# Patient Record
Sex: Male | Born: 1935
Health system: Southern US, Community
[De-identification: ages and names within clinical notes are randomized; demographics above are authoritative.]

## PROBLEM LIST (undated history)

## (undated) DIAGNOSIS — R001 Bradycardia, unspecified: Secondary | ICD-10-CM

## (undated) DIAGNOSIS — D649 Anemia, unspecified: Secondary | ICD-10-CM

## (undated) DIAGNOSIS — T7840XA Allergy, unspecified, initial encounter: Secondary | ICD-10-CM

## (undated) DIAGNOSIS — H3530 Unspecified macular degeneration: Secondary | ICD-10-CM

## (undated) DIAGNOSIS — M1990 Unspecified osteoarthritis, unspecified site: Secondary | ICD-10-CM

## (undated) DIAGNOSIS — I4891 Unspecified atrial fibrillation: Secondary | ICD-10-CM

## (undated) DIAGNOSIS — I219 Acute myocardial infarction, unspecified: Secondary | ICD-10-CM

## (undated) DIAGNOSIS — I209 Angina pectoris, unspecified: Secondary | ICD-10-CM

## (undated) DIAGNOSIS — I739 Peripheral vascular disease, unspecified: Secondary | ICD-10-CM

## (undated) DIAGNOSIS — I2510 Atherosclerotic heart disease of native coronary artery without angina pectoris: Secondary | ICD-10-CM

## (undated) DIAGNOSIS — Z87442 Personal history of urinary calculi: Secondary | ICD-10-CM

## (undated) DIAGNOSIS — H409 Unspecified glaucoma: Secondary | ICD-10-CM

## (undated) DIAGNOSIS — E785 Hyperlipidemia, unspecified: Secondary | ICD-10-CM

## (undated) DIAGNOSIS — I10 Essential (primary) hypertension: Secondary | ICD-10-CM

## (undated) DIAGNOSIS — I38 Endocarditis, valve unspecified: Secondary | ICD-10-CM

## (undated) DIAGNOSIS — K219 Gastro-esophageal reflux disease without esophagitis: Secondary | ICD-10-CM

## (undated) DIAGNOSIS — N200 Calculus of kidney: Secondary | ICD-10-CM

## (undated) DIAGNOSIS — R739 Hyperglycemia, unspecified: Secondary | ICD-10-CM

## (undated) HISTORY — DX: Hyperglycemia, unspecified: R73.9

## (undated) HISTORY — PX: CATARACT EXTRACTION W/ INTRAOCULAR LENS  IMPLANT, BILATERAL: SHX1307

## (undated) HISTORY — PX: EYE SURGERY: SHX253

## (undated) HISTORY — DX: Bradycardia, unspecified: R00.1

## (undated) HISTORY — PX: MOUTH SURGERY: SHX715

## (undated) HISTORY — DX: Anemia, unspecified: D64.9

## (undated) HISTORY — PX: TONSILLECTOMY: SUR1361

## (undated) HISTORY — DX: Allergy, unspecified, initial encounter: T78.40XA

## (undated) HISTORY — DX: Calculus of kidney: N20.0

## (undated) HISTORY — PX: CARPAL TUNNEL RELEASE: SHX101

## (undated) HISTORY — PX: ESOPHAGOGASTRODUODENOSCOPY: SHX1529

## (undated) HISTORY — DX: Unspecified macular degeneration: H35.30

## (undated) HISTORY — PX: HEMORROIDECTOMY: SUR656

---

## 2005-06-02 HISTORY — PX: CORONARY ARTERY BYPASS GRAFT: SHX141

## 2005-06-02 HISTORY — PX: CARDIAC CATHETERIZATION: SHX172

## 2005-08-07 ENCOUNTER — Ambulatory Visit: Payer: Self-pay | Admitting: Unknown Physician Specialty

## 2006-02-03 ENCOUNTER — Ambulatory Visit: Payer: Self-pay | Admitting: Internal Medicine

## 2006-02-16 ENCOUNTER — Inpatient Hospital Stay (HOSPITAL_COMMUNITY): Admission: RE | Admit: 2006-02-16 | Discharge: 2006-02-21 | Payer: Self-pay | Admitting: Cardiothoracic Surgery

## 2006-03-13 ENCOUNTER — Encounter: Admission: RE | Admit: 2006-03-13 | Discharge: 2006-03-13 | Payer: Self-pay | Admitting: Cardiothoracic Surgery

## 2006-03-18 ENCOUNTER — Encounter: Payer: Self-pay | Admitting: Internal Medicine

## 2006-04-02 ENCOUNTER — Encounter: Payer: Self-pay | Admitting: Internal Medicine

## 2006-05-02 ENCOUNTER — Encounter: Payer: Self-pay | Admitting: Internal Medicine

## 2006-06-02 ENCOUNTER — Encounter: Payer: Self-pay | Admitting: Internal Medicine

## 2009-04-25 ENCOUNTER — Ambulatory Visit: Payer: Self-pay | Admitting: Unknown Physician Specialty

## 2009-05-03 ENCOUNTER — Ambulatory Visit: Payer: Self-pay | Admitting: Unknown Physician Specialty

## 2009-06-02 DIAGNOSIS — I219 Acute myocardial infarction, unspecified: Secondary | ICD-10-CM

## 2009-06-02 HISTORY — PX: SHOULDER SURGERY: SHX246

## 2009-06-02 HISTORY — PX: LUMBAR EPIDURAL INJECTION: SHX1980

## 2009-06-02 HISTORY — DX: Acute myocardial infarction, unspecified: I21.9

## 2009-12-15 ENCOUNTER — Ambulatory Visit: Payer: Self-pay | Admitting: Family Medicine

## 2009-12-31 ENCOUNTER — Ambulatory Visit: Payer: Self-pay | Admitting: Pain Medicine

## 2010-01-03 ENCOUNTER — Ambulatory Visit: Payer: Self-pay | Admitting: Pain Medicine

## 2010-01-03 ENCOUNTER — Other Ambulatory Visit: Payer: Self-pay | Admitting: Pain Medicine

## 2010-01-17 ENCOUNTER — Ambulatory Visit: Payer: Self-pay | Admitting: Pain Medicine

## 2010-04-02 ENCOUNTER — Ambulatory Visit: Payer: Self-pay | Admitting: Family Medicine

## 2010-08-06 ENCOUNTER — Ambulatory Visit: Payer: Self-pay | Admitting: Internal Medicine

## 2010-08-23 ENCOUNTER — Emergency Department: Payer: Self-pay | Admitting: Emergency Medicine

## 2012-03-15 LAB — HM PAP SMEAR: HM Pap smear: 1.6

## 2012-03-16 ENCOUNTER — Ambulatory Visit: Payer: Self-pay | Admitting: Family Medicine

## 2013-01-20 LAB — CBC AND DIFFERENTIAL
HEMATOCRIT: 41 % (ref 41–53)
Hemoglobin: 14.2 g/dL (ref 13.5–17.5)
NEUTROS ABS: 4 /uL
PLATELETS: 183 10*3/uL (ref 150–399)
WBC: 6 10^3/mL

## 2013-01-21 ENCOUNTER — Ambulatory Visit: Payer: Self-pay | Admitting: Family Medicine

## 2013-03-30 ENCOUNTER — Ambulatory Visit: Payer: Self-pay | Admitting: Orthopedic Surgery

## 2013-05-09 ENCOUNTER — Ambulatory Visit: Payer: Self-pay | Admitting: Orthopedic Surgery

## 2013-05-09 LAB — CBC WITH DIFFERENTIAL/PLATELET
Basophil %: 0.8 %
Eosinophil #: 0.1 10*3/uL (ref 0.0–0.7)
HGB: 14.6 g/dL (ref 13.0–18.0)
Lymphocyte %: 19.6 %
MCH: 31.5 pg (ref 26.0–34.0)
Monocyte #: 0.6 x10 3/mm (ref 0.2–1.0)
Neutrophil #: 4.2 10*3/uL (ref 1.4–6.5)

## 2013-05-09 LAB — BASIC METABOLIC PANEL
BUN: 26 mg/dL — ABNORMAL HIGH (ref 7–18)
Chloride: 103 mmol/L (ref 98–107)
Co2: 29 mmol/L (ref 21–32)
EGFR (African American): 50 — ABNORMAL LOW
Osmolality: 278 (ref 275–301)
Potassium: 4.4 mmol/L (ref 3.5–5.1)
Sodium: 137 mmol/L (ref 136–145)

## 2013-05-16 ENCOUNTER — Ambulatory Visit: Payer: Self-pay | Admitting: Orthopedic Surgery

## 2013-05-18 LAB — PATHOLOGY REPORT

## 2013-08-05 ENCOUNTER — Ambulatory Visit (INDEPENDENT_AMBULATORY_CARE_PROVIDER_SITE_OTHER): Payer: Medicare Other | Admitting: Podiatry

## 2013-08-05 ENCOUNTER — Encounter: Payer: Self-pay | Admitting: Podiatry

## 2013-08-05 ENCOUNTER — Ambulatory Visit (INDEPENDENT_AMBULATORY_CARE_PROVIDER_SITE_OTHER): Payer: Medicare Other

## 2013-08-05 VITALS — BP 169/88 | HR 65 | Resp 16 | Ht 67.0 in | Wt 170.0 lb

## 2013-08-05 DIAGNOSIS — M779 Enthesopathy, unspecified: Secondary | ICD-10-CM

## 2013-08-05 DIAGNOSIS — M79609 Pain in unspecified limb: Secondary | ICD-10-CM

## 2013-08-05 DIAGNOSIS — M2010 Hallux valgus (acquired), unspecified foot: Secondary | ICD-10-CM

## 2013-08-05 DIAGNOSIS — M2040 Other hammer toe(s) (acquired), unspecified foot: Secondary | ICD-10-CM

## 2013-08-05 DIAGNOSIS — M79673 Pain in unspecified foot: Secondary | ICD-10-CM

## 2013-08-05 MED ORDER — TRIAMCINOLONE ACETONIDE 10 MG/ML IJ SUSP
10.0000 mg | Freq: Once | INTRAMUSCULAR | Status: AC
  Administered 2013-08-05: 10 mg

## 2013-08-05 NOTE — Progress Notes (Signed)
Subjective:     Patient ID: Austin [name], male   DOB: 04/19/1929, 80 y.o.   MRN: [id number]  Foot Pain   patient presents stating I had a long term bunion and digital deformity of my right foot and I am now getting pain more in the outside of my foot for the last one year. States that I did like to play golf and that are hard to do because of the pain and I did have my shoulder fixed in December so I have been off my foot more   Review of Systems  All other systems reviewed and are negative.       Objective:   Physical Exam  Nursing note and vitals reviewed. Constitutional: He is oriented to person, place, and time.  Cardiovascular: Intact distal pulses.   Musculoskeletal: Normal range of motion.  Neurological: He is oriented to person, place, and time.  Skin: Skin is warm.   neurovascular status found to be intact with muscle strength adequate and no equinus condition noted. He does have some depression of the arch noted on both feet and has significant for foot structural malalignment with structural bunion deformity and rigid contracture second toe right foot. Patient has exquisite discomfort underneath the third metatarsophalangeal joint on the right foot    Assessment:     Structural HAV hammertoe deformity capsulitis of the third MPJ right foot    Plan:     H&P and x-ray reviewed right foot and today I am focusing on the abnormal joint I did a proximal nerve block aspirated the joint and was able to get out a small amount of clear fluid and then injected with a half cc of dexamethasone Kenalog combination and applied a thick pad to reduce stress against the joint. Reappoint one week and discuss long-term orthotics therapy

## 2013-08-05 NOTE — Progress Notes (Signed)
   Subjective:    Patient ID: Austin [name], male    DOB: January 16, 1929, 80 y.o.   MRN: [id number]  HPI Comments: N 0 L right foot 2nd digit and under last 3 toes D 1 yr O slowly C worse A walking T massage, topical ointment  Foot Pain      Review of Systems  All other systems reviewed and are negative.       Objective:   Physical Exam        Assessment & Plan:

## 2013-08-12 ENCOUNTER — Ambulatory Visit (INDEPENDENT_AMBULATORY_CARE_PROVIDER_SITE_OTHER): Payer: Medicare Other | Admitting: Podiatry

## 2013-08-12 VITALS — BP 121/52 | HR 66 | Resp 16 | Ht 67.0 in | Wt 170.0 lb

## 2013-08-12 DIAGNOSIS — M779 Enthesopathy, unspecified: Secondary | ICD-10-CM

## 2013-08-12 NOTE — Progress Notes (Signed)
Subjective:     Patient ID: Austin [name], male   DOB: [birth year], 80 y.o.   MRN: [id number]  HPI patient states my right foot is improved but still tender around the joint if I am on it a lot   Review of Systems     Objective:   Physical Exam Neurovascular status intact with mild to moderate discomfort third metatarsophalangeal joint right with fluid accumulation still noted within the joint surface    Assessment:     Capsulitis third MPJ of the right foot    Plan:     Reviewed condition and recommended custom orthotics which were explained the patient. Patient is scanned for customized orthotics at this time and will be seen back in 2 weeks for dispensing

## 2013-08-25 ENCOUNTER — Encounter: Payer: Self-pay | Admitting: *Deleted

## 2013-08-25 NOTE — Progress Notes (Signed)
Sent pt postcard letting him know that orthotics are here.

## 2013-08-26 ENCOUNTER — Ambulatory Visit (INDEPENDENT_AMBULATORY_CARE_PROVIDER_SITE_OTHER): Payer: Medicare Other | Admitting: *Deleted

## 2013-08-26 DIAGNOSIS — M779 Enthesopathy, unspecified: Secondary | ICD-10-CM

## 2013-08-26 NOTE — Patient Instructions (Signed)

## 2013-09-16 ENCOUNTER — Encounter: Payer: Self-pay | Admitting: Podiatry

## 2013-09-16 ENCOUNTER — Ambulatory Visit (INDEPENDENT_AMBULATORY_CARE_PROVIDER_SITE_OTHER): Payer: Medicare Other | Admitting: Podiatry

## 2013-09-16 VITALS — BP 147/64 | HR 67 | Resp 16

## 2013-09-16 DIAGNOSIS — M779 Enthesopathy, unspecified: Secondary | ICD-10-CM

## 2013-09-18 NOTE — Progress Notes (Signed)
Subjective:     Patient ID: Austin [name], male   DOB: April 01, 1929, 80 y.o.   MRN: [id number]  HPI patient states he cannot wear his orthotics and certain shoes but that the pain that he has had is quite a bit better   Review of Systems     Objective:   Physical Exam Neurovascular status intact with diminished inflammation and pain in the left third MPJ with minimal discomfort upon pressure    Assessment:     Improved capsulitis left third MPJ with combination of shoe gear modification and orthotic    Plan:     Advised on continued anti-inflammatories rigid bottom shoes physical therapy and orthotic usage we discussed injections in the future if necessary and will reappoint as needed

## 2013-09-23 ENCOUNTER — Ambulatory Visit: Payer: Medicare Other | Admitting: Podiatry

## 2013-12-14 LAB — HEPATIC FUNCTION PANEL
ALK PHOS: 65 U/L (ref 25–125)
ALT: 15 U/L (ref 10–40)
AST: 20 U/L (ref 14–40)
BILIRUBIN, TOTAL: 1.2 mg/dL

## 2013-12-14 LAB — HEMOGLOBIN A1C: HEMOGLOBIN A1C: 5.5 % (ref 4.0–6.0)

## 2013-12-14 LAB — BASIC METABOLIC PANEL
BUN: 28 mg/dL — AB (ref 4–21)
CREATININE: 1.5 mg/dL — AB (ref 0.6–1.3)
Glucose: 108 mg/dL
Potassium: 4.4 mmol/L (ref 3.4–5.3)
Sodium: 139 mmol/L (ref 137–147)

## 2013-12-14 LAB — LIPID PANEL
Cholesterol: 172 mg/dL (ref 0–200)
HDL: 49 mg/dL (ref 35–70)
LDL CALC: 102 mg/dL
LDl/HDL Ratio: 2.1
TRIGLYCERIDES: 104 mg/dL (ref 40–160)

## 2014-04-05 ENCOUNTER — Ambulatory Visit: Payer: Self-pay | Admitting: Ophthalmology

## 2014-04-26 ENCOUNTER — Ambulatory Visit: Payer: Self-pay | Admitting: Ophthalmology

## 2014-07-06 DIAGNOSIS — Z0000 Encounter for general adult medical examination without abnormal findings: Secondary | ICD-10-CM | POA: Diagnosis not present

## 2014-07-06 DIAGNOSIS — Z1389 Encounter for screening for other disorder: Secondary | ICD-10-CM | POA: Diagnosis not present

## 2014-07-13 DIAGNOSIS — H4312 Vitreous hemorrhage, left eye: Secondary | ICD-10-CM | POA: Diagnosis not present

## 2014-07-19 DIAGNOSIS — Z961 Presence of intraocular lens: Secondary | ICD-10-CM | POA: Diagnosis not present

## 2014-07-19 DIAGNOSIS — H4310 Vitreous hemorrhage, unspecified eye: Secondary | ICD-10-CM | POA: Diagnosis not present

## 2014-07-25 DIAGNOSIS — H4312 Vitreous hemorrhage, left eye: Secondary | ICD-10-CM | POA: Diagnosis not present

## 2014-07-26 DIAGNOSIS — H4312 Vitreous hemorrhage, left eye: Secondary | ICD-10-CM | POA: Diagnosis not present

## 2014-07-26 DIAGNOSIS — H547 Unspecified visual loss: Secondary | ICD-10-CM | POA: Diagnosis not present

## 2014-08-03 DIAGNOSIS — H4312 Vitreous hemorrhage, left eye: Secondary | ICD-10-CM | POA: Diagnosis not present

## 2014-08-29 DIAGNOSIS — Z86010 Personal history of colonic polyps: Secondary | ICD-10-CM | POA: Diagnosis not present

## 2014-08-29 DIAGNOSIS — Z1211 Encounter for screening for malignant neoplasm of colon: Secondary | ICD-10-CM | POA: Diagnosis not present

## 2014-09-11 DIAGNOSIS — Z961 Presence of intraocular lens: Secondary | ICD-10-CM | POA: Diagnosis not present

## 2014-09-11 DIAGNOSIS — H4011X0 Primary open-angle glaucoma, stage unspecified: Secondary | ICD-10-CM | POA: Diagnosis not present

## 2014-09-20 DIAGNOSIS — I10 Essential (primary) hypertension: Secondary | ICD-10-CM | POA: Insufficient documentation

## 2014-09-22 NOTE — Op Note (Signed)
PATIENT NAME:  Austin [name], Austin [name] MR#:  [id number] DATE OF BIRTH:  [birth year]  DATE OF PROCEDURE:  05/16/2013  PREOPERATIVE DIAGNOSIS:  Right shoulder rotator cuff tear with chronic impingement and acromioclavicular joint arthrosis.   POSTOPERATIVE DIAGNOSIS:  Right shoulder rotator cuff tear with chronic impingement and acromioclavicular joint arthrosis.    PROCEDURE:  Right shoulder arthroscopic debridement and synovectomy, removal of loose body, subacromial decompression and distal clavicle excision with mini-open rotator cuff repair.   ANESTHESIA:  General with right interscalene block.   SURGEON:  Timoteo Gaul, M.D.   ESTIMATED BLOOD LOSS:  Minimal.   COMPLICATIONS:  None.   IMPLANTS:  ArthroCare Magnum 2 anchors x 4.   INDICATIONS FOR THE SURGERY:  Austin [name] is an 81 year old male who has had right shoulder pain since February or March 2014. His pain has become progressively worse over time and has become more constant. The patient is very active, both performing jobs at home as well as working out in [employer]. He has noted persistent pain with abduction activity, which affects his ability to workout, play golf and perform activities at home. He has failed all nonoperative management including nonsteroidal anti-inflammatories and corticosteroid injections as well as strengthening exercises. The patient has elected to undergo surgical repair of his right shoulder rotator cuff. The patient had an MRI showing a large rotator cuff tear with retraction to the glenoid. He had severe degenerative changes of the glenohumeral joint is well. The patient had tendinosis of the subscapularis seen as well.   I recommended that we evaluate his shoulder arthroscopically, and attempt a rotator cuff repair. Given the retraction and age of the tear, he understands it may not possible to repair the tendon. He also has been noted on his MRI to have fatty atrophy, which is another indication of a  long-standing injury. The patient understood that the risks of surgery included infection, bleeding, nerve or blood vessel injury, especially to the axillary nerve leading to arm weakness or numbness, shoulder stiffness, re-tear of the rotator cuff or failure of the hardware, failure to repair the rotator cuff and the need for further surgery including total shoulder arthroplasty. Medical complications include, but are not limited to, DVT and pulmonary embolism, myocardial infarction, stroke, pneumonia, respiratory failure and death. The patient understood these risks and wished to proceed.   PROCEDURE NOTE:  The patient was met in preoperative area. I updated his history and physical. His wife is at the bedside. I signed his right shoulder according to the hospital's right site protocol and I answered all the questions by the patient and the family. The patient was then brought to the preoperative area where he then underwent a right interscalene block. He then was brought to the Operating Room where he underwent general anesthesia. He was positioned in the beach chair position for the case. All bony prominences were adequately padded. An examination under anesthesia revealed full shoulder range of motion without instability.   The patient was then prepped and draped in sterile fashion. A time-out was performed to verify the patient's name, date of birth, medical record number, correct site of surgery, and correct procedure to be performed. It was also used to verify the patient had received antibiotics and that all appropriate instruments, implants and radiographic studies were available in the room. Once all in attendance were in agreement, the case began.   The patient's bony landmarks were drawn out with a surgical marker along with the proposed arthroscopy incisions. The  incisions were drawn out with a surgical marker and pre-injected with 1% lidocaine plain. An 11 blade was used to establish a posterior  portal. A hemostat was used to spread the posterior muscle fibers before the arthroscope was placed into the glenohumeral joint. A full diagnostic examination of the shoulder was performed.   Findings on arthroscopy included significant synovitis throughout the glenohumeral joint. There was advanced degenerative changes of the humeral head and to a lesser extent the glenoid. There were 2 loose bodies seen superior to the glenoid and I questioned whether these are actually small bone fragments from a possible, previous the glenoid fracture involving the superior-most aspect of the glenoid. The biceps tendon was not visualized and presumed to be torn and likely retracted out of the joint. The subscapularis was frayed and had an intrasubstance tears, but was not completely torn from the lesser tuberosity. There was a very large tear of the rotator cuff involving the supra and infraspinatus. In the inferior recess, there were no loose bodies but there was significant synovitis. No Hagl lesion was seen. There was no tear of the anterior labrum.   An anterior portal was established using an 18-gauge spinal needle for localization. A 5.75-mm arthroscopic cannula was inserted through the anterior portal. A hook probe was used for the diagnostic portion of the arthroscopy and a 4.0 resector shaver blade was placed through the anterior portal to perform a debridement and synovectomy. A lateral portal was also created using the 18-gauge spinal needle. This allowed for passage of a Kingfisher grasper to remove the 2 loose bodies superior to the glenoid. An Therapist, nutritional was also placed into the posterior leading edge of the rotator cuff and brought out the lateral portal for later localization.   The arthroscope was then removed from the glenohumeral joint and placed in the subacromial space. There was abundant bursitis. A 90-degree ArthroCare wand was placed through the lateral portal along with a 4.0 resector  shaver blade. An extensive bursectomy and debridement was undertaken, which allowed better visualization of the rotator cuff and appreciation of the cuff morphology. There was cuff tissue posteriorly but none superior to the glenoid. It was unclear if the rotator cuff had retracted posteriorly and if there was a large longitudinal split within the tissue, although no definite cuff was identified anteriorly. An additional Smart stitch was placed in the posterior edge of the rotator cuff. With a Kingfisher, the posterior portion of the cuff could be advanced anteriorly to cover a portion of the superior humeral head. The decision was made to try to advance the posterior portion of the cuff above the humeral head to provide some humeral head depression function as well as possibly improve the patient's abduction ability. Views of the rotator cuff were taken also through the anterior portal to better understand the morphology.   A subacromial decompression was performed using a 5.5-mm resector shaver blade. A distal clavicle excision was also performed through the anterior portal given the advanced degenerative changes in the [facility] joint. The shoulder was then copiously irrigated. All arthroscopic instruments were removed.   A saber-type incision was then made along the lateral aspect of the acromion. The subcutaneous tissues were dissected using electrocautery. The deltoid muscle fibers were then identified and split in line with their fibers. The Smart stitches, which had been previously placed arthroscopically, were brought out through the deltoid split. The rotator cuff could be seen and was mobilized off the posterior humeral head to allow for  better advancement superiorly using a #15 blade. Additional Smart stitch was placed in what appeared to be the infraspinatus. Three Opus Magnum 2 anchors were placed intubated greater tuberosity causing advancement of the humeral head. The posterior half of the humeral head  was then covered, but the superior aspect of the glenoid still remained exposed. Some anterior fibers of the rotator cuff were also identified and mobilized. This was also advanced to allow for some convergence laterally of the rotator cuff. A single Magnum 2 anchor was placed to repair of the anterior cuff advancements. The wound was then copiously irrigated. Although there still remained exposed glenohumeral joint, there advancement of the infraspinatus, which I am hopeful will provide improved function and pain relief to the patient. The wound was copiously irrigated. The deltoid fascia was closed with a 0 Vicryl. The subcutaneous tissue closed with 2-0 Vicryl and the skin approximated with a running 4-0 Monocryl. The 3 arthroscopy portals were closed with 4-0 nylon. The patient was awakened and brought to the PACU in stable condition.   I explained to his wife and his daughter about the large tear and that only time will tell whether he will regain much function or have pain relief. He was placed in an abduction sling and has a Polar Care sleeve over the right shoulder. A TENS unit was also applied to the right shoulder as well.   The decision was made not to use a Connexa graft as there was no definite attachment site medially for the graft adhere to. I did not think placement of a graft in this situation would be effective.   ____________________________ Timoteo Gaul, MD [id number] D: 05/21/2013 13:25:53 ET T: 05/21/2013 14:35:17 ET JOB#: [id number]  cc: Timoteo Gaul, MD, <Dictator> Timoteo Gaul MD ELECTRONICALLY SIGNED 05/27/2013 14:41

## 2014-09-25 DIAGNOSIS — I10 Essential (primary) hypertension: Secondary | ICD-10-CM | POA: Diagnosis not present

## 2014-09-25 DIAGNOSIS — I255 Ischemic cardiomyopathy: Secondary | ICD-10-CM | POA: Diagnosis not present

## 2014-09-25 DIAGNOSIS — I340 Nonrheumatic mitral (valve) insufficiency: Secondary | ICD-10-CM | POA: Diagnosis not present

## 2014-09-25 DIAGNOSIS — I25709 Atherosclerosis of coronary artery bypass graft(s), unspecified, with unspecified angina pectoris: Secondary | ICD-10-CM | POA: Diagnosis not present

## 2014-10-02 ENCOUNTER — Ambulatory Visit: Payer: Medicare Other | Admitting: *Deleted

## 2014-10-02 ENCOUNTER — Ambulatory Visit: Payer: Self-pay | Admitting: Unknown Physician Specialty

## 2014-10-02 ENCOUNTER — Ambulatory Visit
Admission: AD | Admit: 2014-10-02 | Payer: Medicare Other | Source: Ambulatory Visit | Admitting: Unknown Physician Specialty

## 2014-10-02 ENCOUNTER — Encounter: Admission: AD | Payer: Self-pay | Source: Ambulatory Visit

## 2014-10-02 ENCOUNTER — Ambulatory Visit
Admission: RE | Admit: 2014-10-02 | Discharge: 2014-10-02 | Disposition: A | Discharge status: Home / Self Care | Payer: Medicare Other | Source: Ambulatory Visit | Attending: Unknown Physician Specialty | Admitting: Unknown Physician Specialty

## 2014-10-02 ENCOUNTER — Encounter
Admission: RE | Disposition: A | Discharge status: Home / Self Care | Payer: Self-pay | Source: Ambulatory Visit | Attending: Unknown Physician Specialty

## 2014-10-02 ENCOUNTER — Encounter: Payer: Self-pay | Admitting: *Deleted

## 2014-10-02 DIAGNOSIS — K5730 Diverticulosis of large intestine without perforation or abscess without bleeding: Secondary | ICD-10-CM | POA: Insufficient documentation

## 2014-10-02 DIAGNOSIS — Z860100 Personal history of colon polyps, unspecified: Secondary | ICD-10-CM

## 2014-10-02 DIAGNOSIS — K635 Polyp of colon: Secondary | ICD-10-CM | POA: Diagnosis not present

## 2014-10-02 DIAGNOSIS — Z7982 Long term (current) use of aspirin: Secondary | ICD-10-CM | POA: Insufficient documentation

## 2014-10-02 DIAGNOSIS — D124 Benign neoplasm of descending colon: Secondary | ICD-10-CM | POA: Insufficient documentation

## 2014-10-02 DIAGNOSIS — K640 First degree hemorrhoids: Secondary | ICD-10-CM | POA: Insufficient documentation

## 2014-10-02 DIAGNOSIS — Z7902 Long term (current) use of antithrombotics/antiplatelets: Secondary | ICD-10-CM | POA: Insufficient documentation

## 2014-10-02 DIAGNOSIS — K5790 Diverticulosis of intestine, part unspecified, without perforation or abscess without bleeding: Secondary | ICD-10-CM | POA: Diagnosis not present

## 2014-10-02 DIAGNOSIS — Z79899 Other long term (current) drug therapy: Secondary | ICD-10-CM | POA: Diagnosis not present

## 2014-10-02 DIAGNOSIS — Z1211 Encounter for screening for malignant neoplasm of colon: Secondary | ICD-10-CM | POA: Diagnosis not present

## 2014-10-02 HISTORY — DX: Essential (primary) hypertension: I10

## 2014-10-02 HISTORY — DX: Gastro-esophageal reflux disease without esophagitis: K21.9

## 2014-10-02 HISTORY — PX: COLONOSCOPY: SHX5424

## 2014-10-02 HISTORY — DX: Acute myocardial infarction, unspecified: I21.9

## 2014-10-02 HISTORY — DX: Unspecified osteoarthritis, unspecified site: M19.90

## 2014-10-02 LAB — HM COLONOSCOPY

## 2014-10-02 SURGERY — COLONOSCOPY
Anesthesia: Monitor Anesthesia Care

## 2014-10-02 SURGERY — COLONOSCOPY
Anesthesia: General

## 2014-10-02 MED ORDER — PHENYLEPHRINE HCL 10 MG/ML IJ SOLN
INTRAMUSCULAR | Status: DC | PRN
Start: 2014-10-02 — End: 2014-10-02
  Administered 2014-10-02: 100 ug via INTRAVENOUS

## 2014-10-02 MED ORDER — LACTATED RINGERS IV SOLN
INTRAVENOUS | Status: DC
  Administered 2014-10-02: 11:00:00 via INTRAVENOUS
  Administered 2014-10-02: 1000 mL via INTRAVENOUS

## 2014-10-02 MED ORDER — SODIUM CHLORIDE 0.9 % IV SOLN: INTRAVENOUS | Status: DC

## 2014-10-02 MED ORDER — GLYCOPYRROLATE 0.2 MG/ML IJ SOLN
INTRAMUSCULAR | Status: DC | PRN
  Administered 2014-10-02: 0.2 mg via INTRAVENOUS

## 2014-10-02 MED ORDER — PROPOFOL INFUSION 10 MG/ML OPTIME
INTRAVENOUS | Status: DC | PRN
  Administered 2014-10-02: 100 ug/kg/min via INTRAVENOUS

## 2014-10-02 MED ORDER — FENTANYL CITRATE (PF) 100 MCG/2ML IJ SOLN
INTRAMUSCULAR | Status: DC | PRN
  Administered 2014-10-02: 50 ug via INTRAVENOUS

## 2014-10-02 MED ORDER — PROPOFOL 10 MG/ML IV BOLUS
INTRAVENOUS | Status: DC | PRN
  Administered 2014-10-02: 40 mg via INTRAVENOUS

## 2014-10-02 MED ORDER — EPHEDRINE SULFATE 50 MG/ML IJ SOLN
INTRAMUSCULAR | Status: DC | PRN
Start: 2014-10-02 — End: 2014-10-02
  Administered 2014-10-02: 10 mg via INTRAVENOUS

## 2014-10-02 NOTE — H&P (Signed)
Personal history of colon polyps. Walks 18 holes of golf.  On plavix which has been held.  Meds noted. See list elsewhere.  PE in office 09/02/2014 was neg.  A stable for colonoscopy.

## 2014-10-02 NOTE — Transfer of Care (Signed)
Immediate Anesthesia Transfer of Care Note  Patient: Austin [name]  Procedure(s) Performed: Procedure(s): COLONOSCOPY (N/A)  Patient Location: PACU  Anesthesia Type:General  Level of Consciousness: awake, alert  and oriented  Airway & Oxygen Therapy: Patient Spontanous Breathing and Patient connected to nasal cannula oxygen  Post-op Assessment: Report given to RN  Post vital signs: Reviewed and stable  Last Vitals:  Filed Vitals:   10/02/14 0957  BP: 142/60  Pulse: 53  Temp: 36.7 C  Resp: 20    Complications: No apparent anesthesia complications

## 2014-10-02 NOTE — Anesthesia Postprocedure Evaluation (Signed)
  Anesthesia Post-op Note  Patient: Austin [name]  Procedure(s) Performed: Procedure(s): COLONOSCOPY (N/A)  Anesthesia type:General  Patient location: PACU  Post pain: Pain level controlled  Post assessment: Post-op Vital signs reviewed, Patient's Cardiovascular Status Stable, Respiratory Function Stable, Patent Airway and No signs of Nausea or vomiting  Post vital signs: Reviewed and stable  Last Vitals:  Filed Vitals:   10/02/14 1123  BP: 118/58  Pulse: 54  Temp: 35.8 C  Resp: 18    Level of consciousness: awake, alert  and patient cooperative  Complications: No apparent anesthesia complications

## 2014-10-02 NOTE — Anesthesia Preprocedure Evaluation (Addendum)
Anesthesia Evaluation  Patient identified by MRN, date of birth, ID band Patient awake  General Assessment Comment:He is now off plavix, on ASA only, and this has been held for several days.  Airway Mallampati: III  TM Distance: >3 FB Neck ROM: Full    Dental  (+) Teeth Intact   Pulmonary former smoker,          Cardiovascular Exercise Tolerance: Good hypertension, Pt. on medications and Pt. on home beta blockers + Past MI IIRhythm:Regular Rate:Normal     Neuro/Psych    GI/Hepatic GERD-  Medicated,  Endo/Other    Renal/GU      Musculoskeletal   Abdominal   Peds  Hematology   Anesthesia Other Findings   Reproductive/Obstetrics                         Anesthesia Physical Anesthesia Plan  ASA: III  Anesthesia Plan: General   Post-op Pain Management:    Induction: Intravenous  Airway Management Planned: Nasal Cannula  Additional Equipment:   Intra-op Plan:   Post-operative Plan:   Informed Consent: I have reviewed the patients History and Physical, chart, labs and discussed the procedure including the risks, benefits and alternatives for the proposed anesthesia with the patient or authorized representative who has indicated his/her understanding and acceptance.     Plan Discussed with: CRNA  Anesthesia Plan Comments:         Anesthesia Quick Evaluation

## 2014-10-02 NOTE — Op Note (Signed)
[facility] Patient Name: Austin [name] Procedure Date: 10/02/2014 10:35 AM MRN: [id number] Account #: [id number] Date of Birth: November 20, 1928 Admit Type: Outpatient Age: 81 Room: [facility] ENDO ROOM 3 Gender: Male Note Status: Finalized Procedure:         Colonoscopy Indications:       High risk colon cancer surveillance: Personal history of                     colonic polyps Providers:         Manya Silvas, MD Referring MD:      Janine Ores. Rosanna Randy, MD (Referring MD) Medicines:         Propofol per Anesthesia Complications:     No immediate complications. Procedure:         Pre-Anesthesia Assessment:                    - After reviewing the risks and benefits, the patient was                     deemed in satisfactory condition to undergo the procedure.                    After obtaining informed consent, the colonoscope was                     passed under direct vision. Throughout the procedure, the                     patient's blood pressure, pulse, and oxygen saturations                     were monitored continuously. The Olympus PCF-H180AL                     colonoscope ( S#: [id number] ) was introduced through the                     anus and advanced to the the cecum, identified by                     appendiceal orifice and ileocecal valve. The colonoscopy                     was performed without difficulty. The patient tolerated                     the procedure well. The quality of the bowel preparation                     was excellent. Findings:      A small polyp was found in the descending colon. The polyp was       semi-pedunculated. The polyp was removed with a jumbo cold forceps.       Resection and retrieval were complete. To prevent bleeding after the       polypectomy, one hemostatic clip was successfully placed. There was no       bleeding during, and at the end, of the procedure.      A few small-mouthed diverticula were  found in the sigmoid colon and in       the transverse colon.      Internal hemorrhoids were found during endoscopy. The hemorrhoids were       small and Grade  I (internal hemorrhoids that do not prolapse). Impression:        - One small polyp in the descending colon. Resected and                     retrieved. Clip was placed.                    - Diverticulosis in the sigmoid colon and in the                     transverse colon.                    - Internal hemorrhoids. Recommendation:    - Await pathology results. Manya Silvas, MD 10/02/2014 11:12:00 AM This report has been signed electronically. Number of Addenda: 0 Note Initiated On: 10/02/2014 10:35 AM Scope Withdrawal Time: 0 hours 11 minutes 10 seconds  Total Procedure Duration: 0 hours 17 minutes 27 seconds       [facility]

## 2014-10-02 NOTE — Discharge Instructions (Signed)
Do not drive or work for 24 hours, resume usual medications and usual diet.

## 2014-10-03 DIAGNOSIS — I255 Ischemic cardiomyopathy: Secondary | ICD-10-CM | POA: Insufficient documentation

## 2014-10-03 DIAGNOSIS — I340 Nonrheumatic mitral (valve) insufficiency: Secondary | ICD-10-CM | POA: Insufficient documentation

## 2014-10-03 LAB — SURGICAL PATHOLOGY

## 2014-10-04 NOTE — Addendum Note (Signed)
Addendum  created 10/04/14 1210 by Elyse Hsu, MD   Modules edited: Anesthesia Responsible Staff

## 2014-10-05 ENCOUNTER — Encounter: Payer: Self-pay | Admitting: Unknown Physician Specialty

## 2014-11-01 DIAGNOSIS — I6529 Occlusion and stenosis of unspecified carotid artery: Secondary | ICD-10-CM | POA: Diagnosis not present

## 2014-11-03 DIAGNOSIS — I6529 Occlusion and stenosis of unspecified carotid artery: Secondary | ICD-10-CM | POA: Diagnosis not present

## 2014-11-03 DIAGNOSIS — I2510 Atherosclerotic heart disease of native coronary artery without angina pectoris: Secondary | ICD-10-CM | POA: Diagnosis not present

## 2014-11-06 ENCOUNTER — Other Ambulatory Visit: Payer: Self-pay | Admitting: Family Medicine

## 2014-11-14 DIAGNOSIS — H40003 Preglaucoma, unspecified, bilateral: Secondary | ICD-10-CM | POA: Diagnosis not present

## 2014-12-08 ENCOUNTER — Encounter: Payer: Self-pay | Admitting: Emergency Medicine

## 2014-12-08 DIAGNOSIS — H409 Unspecified glaucoma: Secondary | ICD-10-CM | POA: Insufficient documentation

## 2014-12-08 DIAGNOSIS — N200 Calculus of kidney: Secondary | ICD-10-CM

## 2014-12-08 DIAGNOSIS — I2510 Atherosclerotic heart disease of native coronary artery without angina pectoris: Secondary | ICD-10-CM | POA: Insufficient documentation

## 2014-12-08 DIAGNOSIS — D649 Anemia, unspecified: Secondary | ICD-10-CM

## 2014-12-08 DIAGNOSIS — N400 Enlarged prostate without lower urinary tract symptoms: Secondary | ICD-10-CM | POA: Insufficient documentation

## 2014-12-08 DIAGNOSIS — I38 Endocarditis, valve unspecified: Secondary | ICD-10-CM | POA: Insufficient documentation

## 2014-12-08 DIAGNOSIS — E785 Hyperlipidemia, unspecified: Secondary | ICD-10-CM | POA: Insufficient documentation

## 2014-12-08 DIAGNOSIS — H269 Unspecified cataract: Secondary | ICD-10-CM | POA: Insufficient documentation

## 2014-12-08 DIAGNOSIS — K222 Esophageal obstruction: Secondary | ICD-10-CM | POA: Insufficient documentation

## 2014-12-08 DIAGNOSIS — R7303 Prediabetes: Secondary | ICD-10-CM | POA: Insufficient documentation

## 2014-12-08 DIAGNOSIS — K219 Gastro-esophageal reflux disease without esophagitis: Secondary | ICD-10-CM | POA: Insufficient documentation

## 2014-12-08 DIAGNOSIS — G4733 Obstructive sleep apnea (adult) (pediatric): Secondary | ICD-10-CM | POA: Insufficient documentation

## 2014-12-08 DIAGNOSIS — K649 Unspecified hemorrhoids: Secondary | ICD-10-CM | POA: Insufficient documentation

## 2014-12-08 DIAGNOSIS — Q394 Esophageal web: Secondary | ICD-10-CM | POA: Insufficient documentation

## 2014-12-08 HISTORY — DX: Calculus of kidney: N20.0

## 2014-12-08 HISTORY — DX: Anemia, unspecified: D64.9

## 2015-01-08 ENCOUNTER — Encounter: Payer: Self-pay | Admitting: Family Medicine

## 2015-01-08 ENCOUNTER — Ambulatory Visit (INDEPENDENT_AMBULATORY_CARE_PROVIDER_SITE_OTHER): Payer: Medicare Other | Admitting: Family Medicine

## 2015-01-08 VITALS — BP 132/60 | HR 52 | Temp 97.9°F | Resp 16 | Wt 173.0 lb

## 2015-01-08 DIAGNOSIS — E785 Hyperlipidemia, unspecified: Secondary | ICD-10-CM | POA: Diagnosis not present

## 2015-01-08 DIAGNOSIS — K219 Gastro-esophageal reflux disease without esophagitis: Secondary | ICD-10-CM

## 2015-01-08 DIAGNOSIS — I10 Essential (primary) hypertension: Secondary | ICD-10-CM | POA: Diagnosis not present

## 2015-01-08 DIAGNOSIS — M791 Myalgia, unspecified site: Secondary | ICD-10-CM

## 2015-01-08 DIAGNOSIS — I25110 Atherosclerotic heart disease of native coronary artery with unstable angina pectoris: Secondary | ICD-10-CM

## 2015-01-08 DIAGNOSIS — R7309 Other abnormal glucose: Secondary | ICD-10-CM | POA: Diagnosis not present

## 2015-01-08 DIAGNOSIS — R7303 Prediabetes: Secondary | ICD-10-CM

## 2015-01-08 DIAGNOSIS — I2510 Atherosclerotic heart disease of native coronary artery without angina pectoris: Secondary | ICD-10-CM | POA: Insufficient documentation

## 2015-01-08 NOTE — Progress Notes (Signed)
Patient ID: Austin [name], male   DOB: 1929-03-05, 81 y.o.   MRN: [id number]    Subjective:  HPI   Hypertension, follow-up:  BP Readings from Last 3 Encounters:  01/08/15 132/60  07/06/14 142/60  10/02/14 136/58    He was last seen for hypertension 6 months ago.  BP at that visit was 142/60. Management changes since that visit include none .He reports good compliance with treatment. He is not having side effects.  He is exercising. Outside blood pressures are 130's/80;s. He is experiencing none.   Cardiovascular risk factors include none.   ------------------------------------------------------------------------    Lipid/Cholesterol, Follow-up:   Last seen for this 6 months ago.  Management changes since that visit include none.  Last Lipid Panel:    Component Value Date/Time   CHOL 172 12/14/2013   TRIG 104 12/14/2013   HDL 49 12/14/2013   LDLCALC 102 12/14/2013    He reports good compliance with treatment. He is having side effects. Muscle aches, so he takes it Lipitor every other day. He has worked this out with his Cardiologist.   Wt Readings from Last 3 Encounters:  01/08/15 173 lb (78.472 kg)  07/06/14 176 lb (79.833 kg)  10/02/14 168 lb (76.204 kg)    ------------------------------------------------------------------------ Follow up pre diabetes- Last A1C was 12/14/13-- 5.5    Prior to Admission medications   Medication Sig Start Date End Date Taking? Authorizing Provider  aspirin 81 MG tablet Take by mouth. 08/12/11  Yes Historical Provider, MD  atorvastatin (LIPITOR) 20 MG tablet Take by mouth. 04/24/14  Yes Historical Provider, MD  Biotin 5000 MCG CAPS Take by mouth.   Yes Historical Provider, MD  Cholecalciferol (VITAMIN D3) 2000 UNITS capsule Take by mouth.   Yes Historical Provider, MD  COENZYME Q-10 PO Take by mouth. 08/12/11  Yes Historical Provider, MD  Cyanocobalamin 100 MCG LOZG Take by mouth.   Yes Historical Provider, MD    GLUCOSAMINE-CHONDROIT-VIT C-MN PO Take by mouth. 08/12/11  Yes Historical Provider, MD  isosorbide mononitrate (IMDUR) 30 MG 24 hr tablet Take by mouth. 08/12/11  Yes Historical Provider, MD  metoprolol succinate (TOPROL-XL) 25 MG 24 hr tablet Take 25 mg by mouth daily.     Historical Provider, MD  Multiple Vitamins-Minerals (PRESERVISION AREDS) CAPS Take by mouth. 08/12/11  Yes Historical Provider, MD  nitroGLYCERIN (NITROSTAT) 0.4 MG SL tablet Place under the tongue. 02/25/11  Yes Historical Provider, MD  OMEGA-3 FATTY ACIDS PO Take by mouth. 02/25/11  Yes Historical Provider, MD  Omeprazole 20 MG TBEC Take by mouth. 02/25/11  Yes Historical Provider, MD  tamsulosin (FLOMAX) 0.4 MG CAPS capsule TAKE 1 CAPSULE BY MOUTH EVERY DAY 11/06/14  Yes Jerrol Banana., MD  timolol (BETIMOL) 0.5 % ophthalmic solution Place 1 drop into both eyes 2 (two) times daily.   Yes Historical Provider, MD  Travoprost, BAK Free, (TRAVATAN Z) 0.004 % SOLN ophthalmic solution Apply to eye. 08/12/11  Yes Historical Provider, MD  valsartan-hydrochlorothiazide (DIOVAN HCT) 320-25 MG per tablet Take by mouth.   Yes Historical Provider, MD    Patient Active Problem List   Diagnosis Date Noted  . Absolute anemia 12/08/2014  . Arteriosclerosis of coronary artery 12/08/2014  . Benign fibroma of prostate 12/08/2014  . Atherosclerosis of coronary artery 12/08/2014  . Carotid artery plaque 12/08/2014  . Cataract 12/08/2014  . Esophageal stenosis 12/08/2014  . Acid reflux 12/08/2014  . Glaucoma 12/08/2014  . Hemorrhoid 12/08/2014  . HLD (hyperlipidemia) 12/08/2014  .  Calculus of kidney 12/08/2014  . Obstructive apnea 12/08/2014  . Borderline diabetes 12/08/2014  . Peripheral blood vessel disorder 12/08/2014  . Esophagogastric ring 12/08/2014  . Heart valve disease 12/08/2014  . MI (mitral incompetence) 10/03/2014  . Cardiomyopathy, ischemic 10/03/2014  . History of colonic polyps 10/02/2014  . Benign essential HTN  09/20/2014    Past Medical History  Diagnosis Date  . Myocardial infarction 2011  . Hypertension   . Arthritis   . GERD (gastroesophageal reflux disease)     History   Social History  . Marital Status: Married    Spouse Name: N/A  . Number of Children: N/A  . Years of Education: N/A   Occupational History  . Not on file.   Social History Main Topics  . Smoking status: Former Smoker -- 15 years    Types: Cigarettes  . Smokeless tobacco: Not on file  . Alcohol Use: 6.0 oz/week    10 Glasses of wine per week  . Drug Use: Not on file  . Sexual Activity: Not on file   Other Topics Concern  . Not on file   Social History Narrative    Allergies  Allergen Reactions  . Penicillins Rash    Review of Systems  Constitutional: Negative.   HENT: Negative.   Eyes: Negative.   Respiratory: Negative.   Cardiovascular: Negative.   Gastrointestinal: Negative.   Genitourinary: Negative.   Musculoskeletal: Positive for myalgias (sometimes, due to Lipitor. ).  Skin: Negative.   Neurological: Negative.   Endo/Heme/Allergies: Negative.   Psychiatric/Behavioral: Negative.     Immunization History  Administered Date(s) Administered  . Pneumococcal Conjugate-13 12/14/2013  . Pneumococcal Polysaccharide-23 04/04/1998, 03/25/2006  . Td 05/01/2008  . Tdap 02/25/2011  . Zoster 10/07/2011   Objective:  BP 132/60 mmHg  Pulse 52  Temp(Src) 97.9 F (36.6 C) (Oral)  Resp 16  Wt 173 lb (78.472 kg)  Physical Exam  Constitutional: He is oriented to person, place, and time and well-developed, well-nourished, and in no distress.  HENT:  Head: Normocephalic and atraumatic.  Right Ear: External ear normal.  Left Ear: External ear normal.  Nose: Nose normal.  Eyes: Conjunctivae and EOM are normal. Pupils are equal, round, and reactive to light.  Neck: Normal range of motion. Neck supple.  Cardiovascular: Normal rate, regular rhythm, normal heart sounds and intact distal pulses.    Pulmonary/Chest: Effort normal and breath sounds normal.  Abdominal: Soft. Bowel sounds are normal.  Neurological: He is alert and oriented to person, place, and time. He has normal reflexes. Gait normal. GCS score is 15.  Skin: Skin is warm and dry.  Psychiatric: Mood, memory, affect and judgment normal.    Lab Results  Component Value Date   WBC 6.2 05/09/2013   HGB 14.6 05/09/2013   HCT 41.1 05/09/2013   PLT 159 05/09/2013   GLUCOSE 93 05/09/2013   CHOL 172 12/14/2013   TRIG 104 12/14/2013   HDL 49 12/14/2013   LDLCALC 102 12/14/2013   HGBA1C 5.5 12/14/2013    CMP     Component Value Date/Time   NA 139 12/14/2013   NA 137 05/09/2013 0912   K 4.4 12/14/2013   K 4.4 05/09/2013 0912   CL 103 05/09/2013 0912   CO2 29 05/09/2013 0912   GLUCOSE 93 05/09/2013 0912   BUN 28* 12/14/2013   BUN 26* 05/09/2013 0912   CREATININE 1.5* 12/14/2013   CREATININE 1.50* 05/09/2013 0912   CALCIUM 9.2 05/09/2013 0912   AST  20 12/14/2013   ALT 15 12/14/2013   ALKPHOS 65 12/14/2013   GFRNONAA 43* 05/09/2013 0912   GFRAA 50* 05/09/2013 0912    Assessment and Plan :  1. Benign essential HTN  - CBC with Differential/Platelet - TSH  2. Gastroesophageal reflux disease, esophagitis presence not specified   3. Borderline diabetes  - COMPLETE METABOLIC PANEL WITH GFR - Hemoglobin A1c  4. HLD (hyperlipidemia) Get labs and forward to cardiology. - Lipid Panel With LDL/HDL Ratio  5. Coronary artery disease involving native coronary artery of native heart with unstable angina pectoris When this flares patient is able to take one nitroglycerin and get good results.  6. Myalgia Takes statin every other day.  - CK (Creatine Kinase)  Patient was seen and examined by Dr. Miguel Aschoff, and noted scribed by Webb Laws, [place] MD [facility] 01/08/2015 8:19 AM

## 2015-01-09 LAB — CBC WITH DIFFERENTIAL/PLATELET
BASOS ABS: 0 10*3/uL (ref 0.0–0.2)
Basos: 0 %
EOS (ABSOLUTE): 0.2 10*3/uL (ref 0.0–0.4)
Eos: 3 %
HEMATOCRIT: 40.2 % (ref 37.5–51.0)
Hemoglobin: 13.5 g/dL (ref 12.6–17.7)
Immature Grans (Abs): 0 10*3/uL (ref 0.0–0.1)
Immature Granulocytes: 0 %
LYMPHS ABS: 1.5 10*3/uL (ref 0.7–3.1)
Lymphs: 28 %
MCH: 28.5 pg (ref 26.6–33.0)
MCHC: 33.6 g/dL (ref 31.5–35.7)
MCV: 85 fL (ref 79–97)
MONOS ABS: 0.5 10*3/uL (ref 0.1–0.9)
Monocytes: 9 %
Neutrophils Absolute: 3.3 10*3/uL (ref 1.4–7.0)
Neutrophils: 60 %
Platelets: 155 10*3/uL (ref 150–379)
RBC: 4.73 x10E6/uL (ref 4.14–5.80)
RDW: 14.3 % (ref 12.3–15.4)
WBC: 5.5 10*3/uL (ref 3.4–10.8)

## 2015-01-09 LAB — COMPREHENSIVE METABOLIC PANEL
ALBUMIN: 4.6 g/dL (ref 3.5–4.7)
ALK PHOS: 70 IU/L (ref 39–117)
ALT: 20 IU/L (ref 0–44)
AST: 17 IU/L (ref 0–40)
Albumin/Globulin Ratio: 1.8 (ref 1.1–2.5)
BUN/Creatinine Ratio: 12 (ref 10–22)
BUN: 20 mg/dL (ref 8–27)
Bilirubin Total: 0.8 mg/dL (ref 0.0–1.2)
CO2: 24 mmol/L (ref 18–29)
Calcium: 9.1 mg/dL (ref 8.6–10.2)
Chloride: 98 mmol/L (ref 97–108)
Creatinine, Ser: 1.66 mg/dL — ABNORMAL HIGH (ref 0.76–1.27)
GFR calc Af Amer: 43 mL/min/{1.73_m2} — ABNORMAL LOW (ref >59)
GFR calc non Af Amer: 37 mL/min/{1.73_m2} — ABNORMAL LOW (ref >59)
GLOBULIN, TOTAL: 2.5 g/dL (ref 1.5–4.5)
GLUCOSE: 115 mg/dL — AB (ref 65–99)
Potassium: 4.9 mmol/L (ref 3.5–5.2)
SODIUM: 138 mmol/L (ref 134–144)
Total Protein: 7.1 g/dL (ref 6.0–8.5)

## 2015-01-09 LAB — LIPID PANEL WITH LDL/HDL RATIO
CHOLESTEROL TOTAL: 170 mg/dL (ref 100–199)
HDL: 47 mg/dL (ref >39)
LDL CALC: 96 mg/dL (ref 0–99)
LDl/HDL Ratio: 2 ratio units (ref 0.0–3.6)
Triglycerides: 136 mg/dL (ref 0–149)
VLDL Cholesterol Cal: 27 mg/dL (ref 5–40)

## 2015-01-09 LAB — TSH: TSH: 3.28 u[IU]/mL (ref 0.450–4.500)

## 2015-01-09 LAB — HEMOGLOBIN A1C
ESTIMATED AVERAGE GLUCOSE: 137 mg/dL
HEMOGLOBIN A1C: 6.4 % — AB (ref 4.8–5.6)

## 2015-01-09 LAB — CK: CK TOTAL: 142 U/L (ref 24–204)

## 2015-01-11 ENCOUNTER — Telehealth: Payer: Self-pay | Admitting: Family Medicine

## 2015-01-11 NOTE — Telephone Encounter (Signed)
Left message to call back regarding lab results.

## 2015-01-11 NOTE — Telephone Encounter (Signed)
Patient is returning call.  °

## 2015-01-11 NOTE — Telephone Encounter (Signed)
Advised patient of lab results  

## 2015-03-26 DIAGNOSIS — I6523 Occlusion and stenosis of bilateral carotid arteries: Secondary | ICD-10-CM | POA: Diagnosis not present

## 2015-03-26 DIAGNOSIS — I10 Essential (primary) hypertension: Secondary | ICD-10-CM | POA: Diagnosis not present

## 2015-03-26 DIAGNOSIS — I252 Old myocardial infarction: Secondary | ICD-10-CM | POA: Insufficient documentation

## 2015-03-26 DIAGNOSIS — I25708 Atherosclerosis of coronary artery bypass graft(s), unspecified, with other forms of angina pectoris: Secondary | ICD-10-CM | POA: Diagnosis not present

## 2015-03-26 DIAGNOSIS — E782 Mixed hyperlipidemia: Secondary | ICD-10-CM | POA: Diagnosis not present

## 2015-04-10 DIAGNOSIS — I25708 Atherosclerosis of coronary artery bypass graft(s), unspecified, with other forms of angina pectoris: Secondary | ICD-10-CM | POA: Diagnosis not present

## 2015-04-10 DIAGNOSIS — I252 Old myocardial infarction: Secondary | ICD-10-CM | POA: Diagnosis not present

## 2015-04-13 DIAGNOSIS — R9439 Abnormal result of other cardiovascular function study: Secondary | ICD-10-CM | POA: Diagnosis not present

## 2015-04-13 DIAGNOSIS — I25708 Atherosclerosis of coronary artery bypass graft(s), unspecified, with other forms of angina pectoris: Secondary | ICD-10-CM | POA: Diagnosis not present

## 2015-04-17 ENCOUNTER — Encounter: Payer: Self-pay | Admitting: *Deleted

## 2015-04-17 ENCOUNTER — Encounter
Admission: RE | Disposition: A | Discharge status: Home / Self Care | Payer: Self-pay | Source: Ambulatory Visit | Attending: Internal Medicine

## 2015-04-17 ENCOUNTER — Ambulatory Visit
Admission: RE | Admit: 2015-04-17 | Discharge: 2015-04-17 | Disposition: A | Discharge status: Home / Self Care | Payer: Medicare Other | Source: Ambulatory Visit | Attending: Internal Medicine | Admitting: Internal Medicine

## 2015-04-17 DIAGNOSIS — I739 Peripheral vascular disease, unspecified: Secondary | ICD-10-CM | POA: Diagnosis not present

## 2015-04-17 DIAGNOSIS — Z8249 Family history of ischemic heart disease and other diseases of the circulatory system: Secondary | ICD-10-CM | POA: Diagnosis not present

## 2015-04-17 DIAGNOSIS — I25709 Atherosclerosis of coronary artery bypass graft(s), unspecified, with unspecified angina pectoris: Secondary | ICD-10-CM | POA: Diagnosis not present

## 2015-04-17 DIAGNOSIS — R079 Chest pain, unspecified: Secondary | ICD-10-CM | POA: Diagnosis present

## 2015-04-17 DIAGNOSIS — Z8042 Family history of malignant neoplasm of prostate: Secondary | ICD-10-CM | POA: Insufficient documentation

## 2015-04-17 DIAGNOSIS — R9439 Abnormal result of other cardiovascular function study: Secondary | ICD-10-CM | POA: Insufficient documentation

## 2015-04-17 DIAGNOSIS — Z951 Presence of aortocoronary bypass graft: Secondary | ICD-10-CM | POA: Diagnosis not present

## 2015-04-17 DIAGNOSIS — Z9889 Other specified postprocedural states: Secondary | ICD-10-CM | POA: Diagnosis not present

## 2015-04-17 DIAGNOSIS — R0602 Shortness of breath: Secondary | ICD-10-CM | POA: Diagnosis present

## 2015-04-17 DIAGNOSIS — Z79899 Other long term (current) drug therapy: Secondary | ICD-10-CM | POA: Insufficient documentation

## 2015-04-17 DIAGNOSIS — H409 Unspecified glaucoma: Secondary | ICD-10-CM | POA: Diagnosis not present

## 2015-04-17 DIAGNOSIS — E785 Hyperlipidemia, unspecified: Secondary | ICD-10-CM | POA: Diagnosis not present

## 2015-04-17 DIAGNOSIS — I38 Endocarditis, valve unspecified: Secondary | ICD-10-CM | POA: Diagnosis not present

## 2015-04-17 DIAGNOSIS — Z7982 Long term (current) use of aspirin: Secondary | ICD-10-CM | POA: Diagnosis not present

## 2015-04-17 DIAGNOSIS — Z87891 Personal history of nicotine dependence: Secondary | ICD-10-CM | POA: Diagnosis not present

## 2015-04-17 DIAGNOSIS — I259 Chronic ischemic heart disease, unspecified: Secondary | ICD-10-CM | POA: Insufficient documentation

## 2015-04-17 DIAGNOSIS — Z880 Allergy status to penicillin: Secondary | ICD-10-CM | POA: Diagnosis not present

## 2015-04-17 DIAGNOSIS — Z8262 Family history of osteoporosis: Secondary | ICD-10-CM | POA: Diagnosis not present

## 2015-04-17 DIAGNOSIS — Z87442 Personal history of urinary calculi: Secondary | ICD-10-CM | POA: Diagnosis not present

## 2015-04-17 DIAGNOSIS — I25119 Atherosclerotic heart disease of native coronary artery with unspecified angina pectoris: Secondary | ICD-10-CM | POA: Diagnosis not present

## 2015-04-17 DIAGNOSIS — I119 Hypertensive heart disease without heart failure: Secondary | ICD-10-CM | POA: Insufficient documentation

## 2015-04-17 HISTORY — PX: CARDIAC CATHETERIZATION: SHX172

## 2015-04-17 SURGERY — LEFT HEART CATH AND CORONARY ANGIOGRAPHY
Anesthesia: Moderate Sedation

## 2015-04-17 MED ORDER — SODIUM CHLORIDE 0.9 % IV SOLN: 250.0000 mL | INTRAVENOUS | Status: DC | PRN

## 2015-04-17 MED ORDER — MIDAZOLAM HCL 2 MG/2ML IJ SOLN
INTRAMUSCULAR | Status: AC
  Filled 2015-04-17: qty 2

## 2015-04-17 MED ORDER — SODIUM CHLORIDE 0.9 % IV SOLN: INTRAVENOUS | Status: DC

## 2015-04-17 MED ORDER — SODIUM CHLORIDE 0.9 % IJ SOLN: 3.0000 mL | INTRAMUSCULAR | Status: DC | PRN

## 2015-04-17 MED ORDER — HEPARIN (PORCINE) IN NACL 2-0.9 UNIT/ML-% IJ SOLN
INTRAMUSCULAR | Status: AC
  Filled 2015-04-17: qty 1000

## 2015-04-17 MED ORDER — MIDAZOLAM HCL 2 MG/2ML IJ SOLN
INTRAMUSCULAR | Status: DC | PRN
  Administered 2015-04-17: 1 mg via INTRAVENOUS

## 2015-04-17 MED ORDER — FENTANYL CITRATE (PF) 100 MCG/2ML IJ SOLN
INTRAMUSCULAR | Status: AC
  Filled 2015-04-17: qty 2

## 2015-04-17 MED ORDER — SODIUM CHLORIDE 0.9 % IJ SOLN
3.0000 mL | Freq: Two times a day (BID) | INTRAMUSCULAR | Status: DC
Start: 2015-04-17 — End: 2015-04-17

## 2015-04-17 MED ORDER — IOHEXOL 300 MG/ML  SOLN
INTRAMUSCULAR | Status: DC | PRN
  Administered 2015-04-17: 165 mL via INTRA_ARTERIAL

## 2015-04-17 MED ORDER — SODIUM CHLORIDE 0.9 % WEIGHT BASED INFUSION: 3.0000 mL/kg/h | INTRAVENOUS | Status: DC

## 2015-04-17 MED ORDER — FENTANYL CITRATE (PF) 100 MCG/2ML IJ SOLN
INTRAMUSCULAR | Status: DC | PRN
  Administered 2015-04-17: 50 ug via INTRAVENOUS

## 2015-04-17 MED ORDER — SODIUM CHLORIDE 0.9 % IV SOLN
INTRAVENOUS | Status: DC
  Administered 2015-04-17 (×2): via INTRAVENOUS

## 2015-04-17 SURGICAL SUPPLY — 9 items
CATH INFINITI 5FR ANG PIGTAIL (CATHETERS) ×3 IMPLANT
CATH INFINITI 5FR JL4 (CATHETERS) ×3 IMPLANT
CATH INFINITI JR4 5F (CATHETERS) ×3 IMPLANT
DEVICE CLOSURE MYNXGRIP 5F (Vascular Products) ×3 IMPLANT
KIT MANI 3VAL PERCEP (MISCELLANEOUS) ×3 IMPLANT
NEEDLE PERC 18GX7CM (NEEDLE) ×3 IMPLANT
PACK CARDIAC CATH (CUSTOM PROCEDURE TRAY) ×3 IMPLANT
SHEATH PINNACLE 5F 10CM (SHEATH) ×3 IMPLANT
WIRE EMERALD 3MM-J .035X150CM (WIRE) ×3 IMPLANT

## 2015-04-17 NOTE — Progress Notes (Signed)
Rechecked all meds on discharge material to assure accuracy prior to discharge,

## 2015-04-17 NOTE — Discharge Instructions (Signed)
Angiogram, Care After °Refer to this sheet in the next few weeks. These instructions provide you with information about caring for yourself after your procedure. Your health care provider may also give you more specific instructions. Your treatment has been planned according to current medical practices, but problems sometimes occur. Call your health care provider if you have any problems or questions after your procedure. °WHAT TO EXPECT AFTER THE PROCEDURE °After your procedure, it is typical to have the following: °· Bruising at the catheter insertion site that usually fades within 1-2 weeks. °· Blood collecting in the tissue (hematoma) that may be painful to the touch. It should usually decrease in size and tenderness within 1-2 weeks. °HOME CARE INSTRUCTIONS °· Take medicines only as directed by your health care provider. °· You may shower 24-48 hours after the procedure or as directed by your health care provider. Remove the bandage (dressing) and gently wash the site with plain soap and water. Pat the area dry with a clean towel. Do not rub the site, because this may cause bleeding. °· Do not take baths, swim, or use a hot tub until your health care provider approves. °· Check your insertion site every day for redness, swelling, or drainage. °· Do not apply powder or lotion to the site. °· Do not lift over 10 lb (4.5 kg) for 5 days after your procedure or as directed by your health care provider. °· Ask your health care provider when it is okay to: °¨ Return to work or school. °¨ Resume usual physical activities or sports. °¨ Resume sexual activity. °· Do not drive home if you are discharged the same day as the procedure. Have someone else drive you. °· You may drive 24 hours after the procedure unless otherwise instructed by your health care provider. °· Do not operate machinery or power tools for 24 hours after the procedure or as directed by your health care provider. °· If your procedure was done as an  outpatient procedure, which means that you went home the same day as your procedure, a responsible adult should be with you for the first 24 hours after you arrive home. °· Keep all follow-up visits as directed by your health care provider. This is important. °SEEK MEDICAL CARE IF: °· You have a fever. °· You have chills. °· You have increased bleeding from the catheter insertion site. Hold pressure on the site. °SEEK IMMEDIATE MEDICAL CARE IF: °· You have unusual pain at the catheter insertion site. °· You have redness, warmth, or swelling at the catheter insertion site. °· You have drainage (other than a small amount of blood on the dressing) from the catheter insertion site. °· The catheter insertion site is bleeding, and the bleeding does not stop after 30 minutes of holding steady pressure on the site. °· The area near or just beyond the catheter insertion site becomes pale, cool, tingly, or numb. °  °This information is not intended to replace advice given to you by your health care provider. Make sure you discuss any questions you have with your health care provider. °  °Document Released: 12/05/2004 Document Revised: 06/09/2014 Document Reviewed: 10/20/2012 °Elsevier Interactive Patient Education ©2016 [employer] ° °

## 2015-04-18 ENCOUNTER — Encounter: Payer: Self-pay | Admitting: Internal Medicine

## 2015-04-30 ENCOUNTER — Ambulatory Visit
Admission: RE | Admit: 2015-04-30 | Discharge: 2015-04-30 | Disposition: A | Discharge status: Home / Self Care | Payer: Medicare Other | Source: Ambulatory Visit | Attending: Family Medicine | Admitting: Family Medicine

## 2015-04-30 ENCOUNTER — Ambulatory Visit (INDEPENDENT_AMBULATORY_CARE_PROVIDER_SITE_OTHER): Payer: Medicare Other | Admitting: Family Medicine

## 2015-04-30 ENCOUNTER — Encounter: Payer: Self-pay | Admitting: Family Medicine

## 2015-04-30 VITALS — BP 142/60 | HR 64 | Temp 97.6°F | Resp 16 | Wt 177.0 lb

## 2015-04-30 DIAGNOSIS — E785 Hyperlipidemia, unspecified: Secondary | ICD-10-CM | POA: Diagnosis not present

## 2015-04-30 DIAGNOSIS — M545 Low back pain: Secondary | ICD-10-CM

## 2015-04-30 DIAGNOSIS — M5136 Other intervertebral disc degeneration, lumbar region: Secondary | ICD-10-CM | POA: Insufficient documentation

## 2015-04-30 DIAGNOSIS — I6789 Other cerebrovascular disease: Secondary | ICD-10-CM | POA: Diagnosis not present

## 2015-04-30 DIAGNOSIS — I10 Essential (primary) hypertension: Secondary | ICD-10-CM

## 2015-04-30 DIAGNOSIS — R7303 Prediabetes: Secondary | ICD-10-CM

## 2015-04-30 LAB — POCT GLYCOSYLATED HEMOGLOBIN (HGB A1C): Hemoglobin A1C: 5.6

## 2015-04-30 NOTE — Progress Notes (Signed)
Patient ID: Austin [name], male   DOB: 30-Apr-1929, 81 y.o.   MRN: [id number]    Subjective:  HPI  Hypertension, follow-up:  BP Readings from Last 3 Encounters:  04/30/15 142/60  04/17/15 158/65  01/08/15 132/60    He was last seen for hypertension 6 months ago.  BP at that visit was 158/65. Management since that visit includes none. He reports good compliance with treatment. He is not having side effects.  He is exercising. Golf, yard work and gym He is adherent to low salt diet.   Outside blood pressures are 110-130's/70;s. He is experiencing none.  Patient denies chest pain, chest pressure/discomfort, claudication, dyspnea, exertional chest pressure/discomfort, fatigue, lower extremity edema and near-syncope.     Wt Readings from Last 3 Encounters:  04/30/15 177 lb (80.287 kg)  04/17/15 175 lb (79.379 kg)  01/08/15 173 lb (78.472 kg)    Pt reports that he saw his cardiologist for chest discomfort and he had a stress test and heart Cath. He is following up with his cardiologist this week to discuss more about the next step. ------------------------------------------------------------------------    Lipid/Cholesterol, Follow-up:   Last seen for this6 months ago.  Management changes since that visit include none. . Last Lipid Panel:    Component Value Date/Time   CHOL 170 01/08/2015 0849   CHOL 172 12/14/2013   TRIG 136 01/08/2015 0849   HDL 47 01/08/2015 0849   HDL 49 12/14/2013   LDLCALC 96 01/08/2015 0849   LDLCALC 102 12/14/2013    He reports good compliance with treatment. He is not having side effects. He had muscle soreness in the past but now he takes Lipitor every other day, helped some but he still has muscle soreness.  Wt Readings from Last 3 Encounters:  04/30/15 177 lb (80.287 kg)  04/17/15 175 lb (79.379 kg)  01/08/15 173 lb (78.472 kg)    -------------------------------------------------------------------   Pre- Diabetes Follow-up:    Lab Results  Component Value Date   HGBA1C 6.4* 01/08/2015   HGBA1C 5.5 12/14/2013    Last seen for diabetes 6 months ago.  Management since then includes none. He reports good compliance with treatment.  Pertinent Labs:    Component Value Date/Time   CHOL 170 01/08/2015 0849   CHOL 172 12/14/2013   TRIG 136 01/08/2015 0849   CREATININE 1.66* 01/08/2015 0849   CREATININE 1.5* 12/14/2013   CREATININE 1.50* 05/09/2013 0912    Wt Readings from Last 3 Encounters:  04/30/15 177 lb (80.287 kg)  04/17/15 175 lb (79.379 kg)  01/08/15 173 lb (78.472 kg)    ------------------------------------------------------------------------ CAD All risk factors treated.  He has already had his flu vaccine.  Prior to Admission medications   Medication Sig Start Date End Date Taking? Authorizing Provider  aspirin 81 MG tablet Take by mouth. 08/12/11  Yes Historical Provider, MD  atorvastatin (LIPITOR) 20 MG tablet Take by mouth. 04/24/14  Yes Historical Provider, MD  Biotin 5000 MCG CAPS Take by mouth.   Yes Historical Provider, MD  Cholecalciferol (VITAMIN D3) 2000 UNITS capsule Take by mouth.   Yes Historical Provider, MD  COENZYME Q-10 PO Take by mouth. 08/12/11  Yes Historical Provider, MD  Cyanocobalamin 100 MCG LOZG Take by mouth.   Yes Historical Provider, MD  GLUCOSAMINE-CHONDROIT-VIT C-MN PO Take by mouth. 08/12/11  Yes Historical Provider, MD  isosorbide mononitrate (IMDUR) 30 MG 24 hr tablet Take by mouth. 08/12/11  Yes Historical Provider, MD  metoprolol succinate (TOPROL-XL) 25 MG 24  hr tablet Take 25 mg by mouth daily.    Yes Historical Provider, MD  Multiple Vitamins-Minerals (PRESERVISION AREDS) CAPS Take by mouth. 08/12/11  Yes Historical Provider, MD  nitroGLYCERIN (NITROSTAT) 0.4 MG SL tablet Place under the tongue. 02/25/11  Yes Historical Provider, MD  OMEGA-3 FATTY ACIDS PO Take by mouth. 02/25/11  Yes Historical Provider, MD  Omeprazole 20 MG TBEC Take by mouth. 02/25/11   Yes Historical Provider, MD  tamsulosin (FLOMAX) 0.4 MG CAPS capsule TAKE 1 CAPSULE BY MOUTH EVERY DAY 11/06/14  Yes Jerrol Banana., MD  timolol (BETIMOL) 0.5 % ophthalmic solution Place 1 drop into both eyes 2 (two) times daily.   Yes Historical Provider, MD  Travoprost, BAK Free, (TRAVATAN Z) 0.004 % SOLN ophthalmic solution Apply to eye. 08/12/11  Yes Historical Provider, MD  valsartan-hydrochlorothiazide (DIOVAN HCT) 320-25 MG per tablet Take by mouth.   Yes Historical Provider, MD    Patient Active Problem List   Diagnosis Date Noted  . Coronary artery disease involving native coronary artery of native heart with unstable angina pectoris ([place]) 01/08/2015  . Absolute anemia 12/08/2014  . Arteriosclerosis of coronary artery 12/08/2014  . Benign fibroma of prostate 12/08/2014  . Atherosclerosis of coronary artery 12/08/2014  . Carotid artery plaque 12/08/2014  . Cataract 12/08/2014  . Esophageal stenosis 12/08/2014  . Acid reflux 12/08/2014  . Glaucoma 12/08/2014  . Hemorrhoid 12/08/2014  . HLD (hyperlipidemia) 12/08/2014  . Calculus of kidney 12/08/2014  . Obstructive apnea 12/08/2014  . Borderline diabetes 12/08/2014  . Peripheral blood vessel disorder ([place]) 12/08/2014  . Esophagogastric ring 12/08/2014  . Heart valve disease 12/08/2014  . MI (mitral incompetence) 10/03/2014  . Cardiomyopathy, ischemic 10/03/2014  . History of colonic polyps 10/02/2014  . Benign essential HTN 09/20/2014    Past Medical History  Diagnosis Date  . Myocardial infarction ([place]) 2011  . Hypertension   . Arthritis   . GERD (gastroesophageal reflux disease)     Social History   Social History  . Marital Status: Married    Spouse Name: N/A  . Number of Children: N/A  . Years of Education: N/A   Occupational History  . Not on file.   Social History Main Topics  . Smoking status: Former Smoker -- 15 years    Types: Cigarettes  . Smokeless tobacco: Not on file  . Alcohol Use: Yes      Comment: 1-2 glasses of wine a night  . Drug Use: No  . Sexual Activity: Not on file   Other Topics Concern  . Not on file   Social History Narrative    Allergies  Allergen Reactions  . Penicillins Rash    Review of Systems  Constitutional: Positive for malaise/fatigue.  HENT: Negative.   Eyes: Negative.   Respiratory: Negative.   Cardiovascular: Negative.   Gastrointestinal: Negative.   Genitourinary: Negative.   Musculoskeletal: Positive for myalgias.  Skin: Negative.   Neurological: Negative.   Endo/Heme/Allergies: Negative.   Psychiatric/Behavioral: Negative.     Immunization History  Administered Date(s) Administered  . Pneumococcal Conjugate-13 12/14/2013  . Pneumococcal Polysaccharide-23 04/04/1998, 03/25/2006  . Td 05/01/2008  . Tdap 02/25/2011  . Zoster 10/07/2011   Objective:  BP 142/60 mmHg  Pulse 64  Temp(Src) 97.6 F (36.4 C) (Oral)  Resp 16  Wt 177 lb (80.287 kg)  Physical Exam  Constitutional: He is oriented to person, place, and time and well-developed, well-nourished, and in no distress.  Eyes: Conjunctivae and EOM are normal.  Pupils are equal, round, and reactive to light.  Neck: Normal range of motion. Neck supple.  Cardiovascular: Normal rate, regular rhythm and intact distal pulses.   Murmur (2/6 systolic murmur at the left lower sternal border.) heard. Pulmonary/Chest: Effort normal and breath sounds normal.  Abdominal: Soft. Bowel sounds are normal.  Musculoskeletal: Normal range of motion.  Neurological: He is alert and oriented to person, place, and time. He has normal reflexes. Gait normal. GCS score is 15.  Skin: Skin is warm and dry.  Psychiatric: Mood, memory, affect and judgment normal.    Lab Results  Component Value Date   WBC 5.5 01/08/2015   HGB 14.6 05/09/2013   HCT 40.2 01/08/2015   PLT 159 05/09/2013   GLUCOSE 115* 01/08/2015   CHOL 170 01/08/2015   TRIG 136 01/08/2015   HDL 47 01/08/2015   LDLCALC 96  01/08/2015   TSH 3.280 01/08/2015   HGBA1C 6.4* 01/08/2015    CMP     Component Value Date/Time   NA 138 01/08/2015 0849   NA 137 05/09/2013 0912   K 4.9 01/08/2015 0849   K 4.4 05/09/2013 0912   CL 98 01/08/2015 0849   CL 103 05/09/2013 0912   CO2 24 01/08/2015 0849   CO2 29 05/09/2013 0912   GLUCOSE 115* 01/08/2015 0849   GLUCOSE 93 05/09/2013 0912   BUN 20 01/08/2015 0849   BUN 26* 05/09/2013 0912   CREATININE 1.66* 01/08/2015 0849   CREATININE 1.5* 12/14/2013   CREATININE 1.50* 05/09/2013 0912   CALCIUM 9.1 01/08/2015 0849   CALCIUM 9.2 05/09/2013 0912   PROT 7.1 01/08/2015 0849   ALBUMIN 4.6 01/08/2015 0849   AST 17 01/08/2015 0849   ALT 20 01/08/2015 0849   ALKPHOS 70 01/08/2015 0849   BILITOT 0.8 01/08/2015 0849   GFRNONAA 37* 01/08/2015 0849   GFRNONAA 43* 05/09/2013 0912   GFRAA 43* 01/08/2015 0849   GFRAA 50* 05/09/2013 0912    Assessment and Plan :  1. Benign essential HTN   2. HLD (hyperlipidemia)   3. Borderline diabetes  - POCT HgB A1C - 5.6 today.  4. Low back pain, unspecified back pain laterality, with sciatica presence unspecified  5.CAD All risk factors treated.  Patient was seen and examined by Dr. Miguel Aschoff, and noted scribed by Webb Laws, [place] [facility] 04/30/2015 8:30 AM

## 2015-05-03 DIAGNOSIS — R001 Bradycardia, unspecified: Secondary | ICD-10-CM | POA: Diagnosis not present

## 2015-05-03 DIAGNOSIS — E782 Mixed hyperlipidemia: Secondary | ICD-10-CM | POA: Diagnosis not present

## 2015-05-03 DIAGNOSIS — I25708 Atherosclerosis of coronary artery bypass graft(s), unspecified, with other forms of angina pectoris: Secondary | ICD-10-CM | POA: Diagnosis not present

## 2015-05-03 HISTORY — DX: Bradycardia, unspecified: R00.1

## 2015-05-15 DIAGNOSIS — H40003 Preglaucoma, unspecified, bilateral: Secondary | ICD-10-CM | POA: Diagnosis not present

## 2015-05-22 DIAGNOSIS — H40003 Preglaucoma, unspecified, bilateral: Secondary | ICD-10-CM | POA: Diagnosis not present

## 2015-06-05 DIAGNOSIS — I38 Endocarditis, valve unspecified: Secondary | ICD-10-CM | POA: Diagnosis not present

## 2015-06-05 DIAGNOSIS — I6523 Occlusion and stenosis of bilateral carotid arteries: Secondary | ICD-10-CM | POA: Diagnosis not present

## 2015-06-05 DIAGNOSIS — I739 Peripheral vascular disease, unspecified: Secondary | ICD-10-CM | POA: Diagnosis not present

## 2015-06-05 DIAGNOSIS — I10 Essential (primary) hypertension: Secondary | ICD-10-CM | POA: Diagnosis not present

## 2015-06-05 DIAGNOSIS — I25708 Atherosclerosis of coronary artery bypass graft(s), unspecified, with other forms of angina pectoris: Secondary | ICD-10-CM | POA: Diagnosis not present

## 2015-06-21 ENCOUNTER — Telehealth: Payer: Self-pay | Admitting: Family Medicine

## 2015-06-21 ENCOUNTER — Ambulatory Visit (INDEPENDENT_AMBULATORY_CARE_PROVIDER_SITE_OTHER): Payer: Medicare Other | Admitting: Family Medicine

## 2015-06-21 ENCOUNTER — Encounter: Payer: Self-pay | Admitting: Family Medicine

## 2015-06-21 VITALS — BP 118/54 | HR 62 | Temp 97.8°F | Resp 14 | Wt 178.0 lb

## 2015-06-21 DIAGNOSIS — M4807 Spinal stenosis, lumbosacral region: Secondary | ICD-10-CM | POA: Diagnosis not present

## 2015-06-21 DIAGNOSIS — M545 Low back pain: Secondary | ICD-10-CM | POA: Diagnosis not present

## 2015-06-21 DIAGNOSIS — I25110 Atherosclerotic heart disease of native coronary artery with unstable angina pectoris: Secondary | ICD-10-CM | POA: Diagnosis not present

## 2015-06-21 DIAGNOSIS — I10 Essential (primary) hypertension: Secondary | ICD-10-CM | POA: Diagnosis not present

## 2015-06-21 NOTE — Progress Notes (Signed)
Patient ID: Austin [name], male   DOB: December 16, 1928, 82 y.o.   MRN: [id number]    Subjective:  HPI  Patient is here to discuss chronic back and hip pain. Patient states he has had MRI to evaluate this before and has seen a specialist but it has been several years ago. Patient states within the last year pain has gotten worse. He plays golf and takes Aleve prior to the game but it has gotten to where Aleve does not help at all now. He wants to re address this issue and see what needs to be done. Denies weakness or numbness in his legs.  LS Xray on November 28th revealed DDD and arthritis. MRI of LS spine 12/15/09 revealed, multilevel degenerative disc and facet disease, stenosis (severe L4 and L5), and severe bilateral neuroforaminol narrowing at this level.  Prior to Admission medications   Medication Sig Start Date End Date Taking? Authorizing Provider  aspirin 81 MG tablet Take by mouth. 08/12/11  Yes Historical Provider, MD  atorvastatin (LIPITOR) 20 MG tablet Take by mouth. 04/24/14  Yes Historical Provider, MD  Biotin 5000 MCG CAPS Take by mouth.   Yes Historical Provider, MD  Cholecalciferol (VITAMIN D3) 2000 UNITS capsule Take by mouth.   Yes Historical Provider, MD  COENZYME Q-10 PO Take by mouth. 08/12/11  Yes Historical Provider, MD  Cyanocobalamin 100 MCG LOZG Take by mouth.   Yes Historical Provider, MD  GLUCOSAMINE-CHONDROIT-VIT C-MN PO Take by mouth. 08/12/11  Yes Historical Provider, MD  isosorbide mononitrate (IMDUR) 30 MG 24 hr tablet Take by mouth. 08/12/11  Yes Historical Provider, MD  metoprolol succinate (TOPROL-XL) 25 MG 24 hr tablet Take 25 mg by mouth daily.    Yes Historical Provider, MD  Multiple Vitamins-Minerals (PRESERVISION AREDS) CAPS Take by mouth. 08/12/11  Yes Historical Provider, MD  nitroGLYCERIN (NITROSTAT) 0.4 MG SL tablet Place under the tongue. 02/25/11  Yes Historical Provider, MD  OMEGA-3 FATTY ACIDS PO Take by mouth. 02/25/11  Yes Historical Provider, MD    Omeprazole 20 MG TBEC Take by mouth. 02/25/11  Yes Historical Provider, MD  tamsulosin (FLOMAX) 0.4 MG CAPS capsule TAKE 1 CAPSULE BY MOUTH EVERY DAY 11/06/14  Yes Jerrol Banana., MD  timolol (BETIMOL) 0.5 % ophthalmic solution Place 1 drop into both eyes 2 (two) times daily.   Yes Historical Provider, MD  Travoprost, BAK Free, (TRAVATAN Z) 0.004 % SOLN ophthalmic solution Apply to eye. 08/12/11  Yes Historical Provider, MD  valsartan-hydrochlorothiazide (DIOVAN HCT) 320-25 MG per tablet Take by mouth.   Yes Historical Provider, MD    Patient Active Problem List   Diagnosis Date Noted  . Bradycardia 05/03/2015  . Coronary artery disease involving native coronary artery of native heart with unstable angina pectoris ([place]) 01/08/2015  . Absolute anemia 12/08/2014  . Arteriosclerosis of coronary artery 12/08/2014  . Benign fibroma of prostate 12/08/2014  . Atherosclerosis of coronary artery 12/08/2014  . Carotid artery plaque 12/08/2014  . Cataract 12/08/2014  . Esophageal stenosis 12/08/2014  . Acid reflux 12/08/2014  . Glaucoma 12/08/2014  . Hemorrhoid 12/08/2014  . HLD (hyperlipidemia) 12/08/2014  . Calculus of kidney 12/08/2014  . Obstructive apnea 12/08/2014  . Borderline diabetes 12/08/2014  . Peripheral blood vessel disorder ([place]) 12/08/2014  . Esophagogastric ring 12/08/2014  . Heart valve disease 12/08/2014  . MI (mitral incompetence) 10/03/2014  . Cardiomyopathy, ischemic 10/03/2014  . History of colonic polyps 10/02/2014  . Benign essential HTN 09/20/2014    Past Medical  History  Diagnosis Date  . Myocardial infarction ([place]) 2011  . Hypertension   . Arthritis   . GERD (gastroesophageal reflux disease)     Social History   Social History  . Marital Status: Married    Spouse Name: N/A  . Number of Children: N/A  . Years of Education: N/A   Occupational History  . Not on file.   Social History Main Topics  . Smoking status: Former Smoker -- 15 years     Types: Cigarettes  . Smokeless tobacco: Never Used  . Alcohol Use: Yes     Comment: 1-2 glasses of wine a night  . Drug Use: No  . Sexual Activity: No   Other Topics Concern  . Not on file   Social History Narrative    Allergies  Allergen Reactions  . Penicillins Rash    Review of Systems  Constitutional: Negative.   Respiratory: Negative.   Cardiovascular: Negative.   Musculoskeletal: Positive for back pain and joint pain.  Neurological: Positive for tingling (shoulders-feels like a nerve irritation).    Immunization History  Administered Date(s) Administered  . Influenza-Unspecified 03/03/2015  . Pneumococcal Conjugate-13 12/14/2013  . Pneumococcal Polysaccharide-23 04/04/1998, 03/25/2006  . Td 05/01/2008  . Tdap 02/25/2011  . Zoster 10/07/2011   Objective:  BP 118/54 mmHg  Pulse 62  Temp(Src) 97.8 F (36.6 C)  Resp 14  Wt 178 lb (80.74 kg)  Physical Exam  Constitutional: He is oriented to person, place, and time and well-developed, well-nourished, and in no distress.  HENT:  Head: Normocephalic and atraumatic.  Eyes: Conjunctivae are normal. Pupils are equal, round, and reactive to light.  Neck: Normal range of motion. Neck supple.  Cardiovascular: Normal rate, regular rhythm, normal heart sounds and intact distal pulses.   No murmur heard. Pulmonary/Chest: Effort normal and breath sounds normal. No respiratory distress. He has no wheezes.  Abdominal: Soft.  Musculoskeletal: He exhibits no edema or tenderness.  Neurological: He is alert and oriented to person, place, and time. He is not agitated. He displays no weakness, no tremor, normal speech and normal reflexes (normal). No sensory deficit. He exhibits normal muscle tone. Coordination normal.  Skin: Skin is warm and dry.  Psychiatric: Mood, memory, affect and judgment normal.    Lab Results  Component Value Date   WBC 5.5 01/08/2015   HGB 14.6 05/09/2013   HCT 40.2 01/08/2015   PLT 155 01/08/2015    GLUCOSE 115* 01/08/2015   CHOL 170 01/08/2015   TRIG 136 01/08/2015   HDL 47 01/08/2015   LDLCALC 96 01/08/2015   TSH 3.280 01/08/2015   HGBA1C 5.6 04/30/2015    CMP     Component Value Date/Time   NA 138 01/08/2015 0849   NA 137 05/09/2013 0912   K 4.9 01/08/2015 0849   K 4.4 05/09/2013 0912   CL 98 01/08/2015 0849   CL 103 05/09/2013 0912   CO2 24 01/08/2015 0849   CO2 29 05/09/2013 0912   GLUCOSE 115* 01/08/2015 0849   GLUCOSE 93 05/09/2013 0912   BUN 20 01/08/2015 0849   BUN 26* 05/09/2013 0912   CREATININE 1.66* 01/08/2015 0849   CREATININE 1.5* 12/14/2013   CREATININE 1.50* 05/09/2013 0912   CALCIUM 9.1 01/08/2015 0849   CALCIUM 9.2 05/09/2013 0912   PROT 7.1 01/08/2015 0849   ALBUMIN 4.6 01/08/2015 0849   AST 17 01/08/2015 0849   ALT 20 01/08/2015 0849   ALKPHOS 70 01/08/2015 0849   BILITOT 0.8 01/08/2015 0849  GFRNONAA 37* 01/08/2015 0849   GFRNONAA 43* 05/09/2013 0912   GFRAA 43* 01/08/2015 0849   GFRAA 50* 05/09/2013 0912    Assessment and Plan :  1. Low back pain, unspecified back pain laterality, with sciatica presence unspecified Worsening. Will go ahead and refer to Dr. Sharlet Salina. Offered Prednisone treatment first but patient wants to go ahead and be referred.  - Ambulatory referral to Pain Clinic  2. Spinal stenosis of lumbosacral region - Ambulatory referral to Pain Clinic  3. Benign essential HTN Stable.  4. Coronary artery disease involving native coronary artery of native heart with unstable angina pectoris ([place])  I have done the exam and reviewed the above chart and it is accurate to the best of my knowledge.  Patient was seen and examined by Dr. Eulas Post and note was scribed by Theressa Millard, RMA.    Miguel Aschoff MD [facility] 06/21/2015 8:16 AM

## 2015-06-21 NOTE — Telephone Encounter (Signed)
Is this ok?-aa 

## 2015-06-21 NOTE — Telephone Encounter (Signed)
Pt is requesting to see Dr Marjorie Smolder at pain clinic instead of seeing of Dr Sharlet Salina.Please advise

## 2015-06-22 NOTE — Telephone Encounter (Signed)
Judson Roch is aware that per Dr. Rosanna Randy it is okay to switch providers.

## 2015-06-22 NOTE — Telephone Encounter (Signed)
ok 

## 2015-07-03 DIAGNOSIS — H40003 Preglaucoma, unspecified, bilateral: Secondary | ICD-10-CM | POA: Diagnosis not present

## 2015-07-17 DIAGNOSIS — I25810 Atherosclerosis of coronary artery bypass graft(s) without angina pectoris: Secondary | ICD-10-CM | POA: Diagnosis not present

## 2015-07-17 DIAGNOSIS — I10 Essential (primary) hypertension: Secondary | ICD-10-CM | POA: Diagnosis not present

## 2015-07-17 DIAGNOSIS — I739 Peripheral vascular disease, unspecified: Secondary | ICD-10-CM | POA: Diagnosis not present

## 2015-08-13 ENCOUNTER — Ambulatory Visit: Payer: Medicare Other | Attending: Pain Medicine | Admitting: Pain Medicine

## 2015-08-13 ENCOUNTER — Encounter: Payer: Self-pay | Admitting: Pain Medicine

## 2015-08-13 VITALS — BP 154/54 | HR 60 | Temp 98.0°F | Resp 16 | Ht 67.0 in | Wt 170.0 lb

## 2015-08-13 DIAGNOSIS — M545 Low back pain, unspecified: Secondary | ICD-10-CM | POA: Insufficient documentation

## 2015-08-13 DIAGNOSIS — D649 Anemia, unspecified: Secondary | ICD-10-CM | POA: Diagnosis not present

## 2015-08-13 DIAGNOSIS — I252 Old myocardial infarction: Secondary | ICD-10-CM | POA: Insufficient documentation

## 2015-08-13 DIAGNOSIS — Z87442 Personal history of urinary calculi: Secondary | ICD-10-CM | POA: Diagnosis not present

## 2015-08-13 DIAGNOSIS — M47816 Spondylosis without myelopathy or radiculopathy, lumbar region: Secondary | ICD-10-CM | POA: Insufficient documentation

## 2015-08-13 DIAGNOSIS — I7389 Other specified peripheral vascular diseases: Secondary | ICD-10-CM | POA: Diagnosis not present

## 2015-08-13 DIAGNOSIS — I10 Essential (primary) hypertension: Secondary | ICD-10-CM | POA: Diagnosis not present

## 2015-08-13 DIAGNOSIS — G4733 Obstructive sleep apnea (adult) (pediatric): Secondary | ICD-10-CM | POA: Diagnosis not present

## 2015-08-13 DIAGNOSIS — Z87891 Personal history of nicotine dependence: Secondary | ICD-10-CM | POA: Diagnosis not present

## 2015-08-13 DIAGNOSIS — M47896 Other spondylosis, lumbar region: Secondary | ICD-10-CM | POA: Diagnosis not present

## 2015-08-13 DIAGNOSIS — I25110 Atherosclerotic heart disease of native coronary artery with unstable angina pectoris: Secondary | ICD-10-CM | POA: Insufficient documentation

## 2015-08-13 DIAGNOSIS — R7303 Prediabetes: Secondary | ICD-10-CM | POA: Diagnosis not present

## 2015-08-13 DIAGNOSIS — I255 Ischemic cardiomyopathy: Secondary | ICD-10-CM | POA: Diagnosis not present

## 2015-08-13 DIAGNOSIS — Z86010 Personal history of colonic polyps: Secondary | ICD-10-CM | POA: Insufficient documentation

## 2015-08-13 DIAGNOSIS — Z9889 Other specified postprocedural states: Secondary | ICD-10-CM | POA: Insufficient documentation

## 2015-08-13 DIAGNOSIS — I2510 Atherosclerotic heart disease of native coronary artery without angina pectoris: Secondary | ICD-10-CM | POA: Diagnosis not present

## 2015-08-13 DIAGNOSIS — Z951 Presence of aortocoronary bypass graft: Secondary | ICD-10-CM | POA: Insufficient documentation

## 2015-08-13 DIAGNOSIS — M4806 Spinal stenosis, lumbar region: Secondary | ICD-10-CM | POA: Insufficient documentation

## 2015-08-13 DIAGNOSIS — M4316 Spondylolisthesis, lumbar region: Secondary | ICD-10-CM | POA: Insufficient documentation

## 2015-08-13 DIAGNOSIS — E785 Hyperlipidemia, unspecified: Secondary | ICD-10-CM | POA: Insufficient documentation

## 2015-08-13 DIAGNOSIS — K222 Esophageal obstruction: Secondary | ICD-10-CM | POA: Diagnosis not present

## 2015-08-13 DIAGNOSIS — Z880 Allergy status to penicillin: Secondary | ICD-10-CM | POA: Insufficient documentation

## 2015-08-13 DIAGNOSIS — G8929 Other chronic pain: Secondary | ICD-10-CM | POA: Insufficient documentation

## 2015-08-13 DIAGNOSIS — H409 Unspecified glaucoma: Secondary | ICD-10-CM | POA: Diagnosis not present

## 2015-08-13 DIAGNOSIS — D291 Benign neoplasm of prostate: Secondary | ICD-10-CM | POA: Diagnosis not present

## 2015-08-13 DIAGNOSIS — M48061 Spinal stenosis, lumbar region without neurogenic claudication: Secondary | ICD-10-CM

## 2015-08-13 DIAGNOSIS — K649 Unspecified hemorrhoids: Secondary | ICD-10-CM | POA: Insufficient documentation

## 2015-08-13 DIAGNOSIS — M4310 Spondylolisthesis, site unspecified: Secondary | ICD-10-CM

## 2015-08-13 DIAGNOSIS — Q762 Congenital spondylolisthesis: Secondary | ICD-10-CM

## 2015-08-13 DIAGNOSIS — K219 Gastro-esophageal reflux disease without esophagitis: Secondary | ICD-10-CM | POA: Diagnosis not present

## 2015-08-13 DIAGNOSIS — M549 Dorsalgia, unspecified: Secondary | ICD-10-CM | POA: Diagnosis present

## 2015-08-13 NOTE — Patient Instructions (Signed)
GENERAL RISKS AND COMPLICATIONS  What are the risk, side effects and possible complications? Generally speaking, most procedures are safe.  However, with any procedure there are risks, side effects, and the possibility of complications.  The risks and complications are dependent upon the sites that are lesioned, or the type of nerve block to be performed.  The closer the procedure is to the spine, the more serious the risks are.  Great care is taken when placing the radio frequency needles, block needles or lesioning probes, but sometimes complications can occur. 1. Infection: Any time there is an injection through the skin, there is a risk of infection.  This is why sterile conditions are used for these blocks.  There are four possible types of infection. 1. Localized skin infection. 2. Central Nervous System Infection-This can be in the form of Meningitis, which can be deadly. 3. Epidural Infections-This can be in the form of an epidural abscess, which can cause pressure inside of the spine, causing compression of the spinal cord with subsequent paralysis. This would require an emergency surgery to decompress, and there are no guarantees that the patient would recover from the paralysis. 4. Discitis-This is an infection of the intervertebral discs.  It occurs in about 1% of discography procedures.  It is difficult to treat and it may lead to surgery.        2. Pain: the needles have to go through skin and soft tissues, will cause soreness.       3. Damage to internal structures:  The nerves to be lesioned may be near blood vessels or    other nerves which can be potentially damaged.       4. Bleeding: Bleeding is more common if the patient is taking blood thinners such as  aspirin, Coumadin, Ticiid, Plavix, etc., or if he/she have some genetic predisposition  such as hemophilia. Bleeding into the spinal canal can cause compression of the spinal  cord with subsequent paralysis.  This would require an  emergency surgery to  decompress and there are no guarantees that the patient would recover from the  paralysis.       5. Pneumothorax:  Puncturing of a lung is a possibility, every time a needle is introduced in  the area of the chest or upper back.  Pneumothorax refers to free air around the  collapsed lung(s), inside of the thoracic cavity (chest cavity).  Another two possible  complications related to a similar event would include: Hemothorax and Chylothorax.   These are variations of the Pneumothorax, where instead of air around the collapsed  lung(s), you may have blood or chyle, respectively.       6. Spinal headaches: They may occur with any procedures in the area of the spine.       7. Persistent CSF (Cerebro-Spinal Fluid) leakage: This is a rare problem, but may occur  with prolonged intrathecal or epidural catheters either due to the formation of a fistulous  track or a dural tear.       8. Nerve damage: By working so close to the spinal cord, there is always a possibility of  nerve damage, which could be as serious as a permanent spinal cord injury with  paralysis.       9. Death:  Although rare, severe deadly allergic reactions known as "Anaphylactic  reaction" can occur to any of the medications used.      10. Worsening of the symptoms:  We can always make thing worse.    What are the chances of something like this happening? Chances of any of this occuring are extremely low.  By statistics, you have more of a chance of getting killed in a motor vehicle accident: while driving to the hospital than any of the above occurring .  Nevertheless, you should be aware that they are possibilities.  In general, it is similar to taking a shower.  Everybody knows that you can slip, hit your head and get killed.  Does that mean that you should not shower again?  Nevertheless always keep in mind that statistics do not mean anything if you happen to be on the wrong side of them.  Even if a procedure has a 1  (one) in a 1,000,000 (million) chance of going wrong, it you happen to be that one..Also, keep in mind that by statistics, you have more of a chance of having something go wrong when taking medications.  Who should not have this procedure? If you are on a blood thinning medication (e.g. Coumadin, Plavix, see list of "Blood Thinners"), or if you have an active infection going on, you should not have the procedure.  If you are taking any blood thinners, please inform your physician.  How should I prepare for this procedure?  Do not eat or drink anything at least six hours prior to the procedure.  Bring a driver with you .  It cannot be a taxi.  Come accompanied by an adult that can drive you back, and that is strong enough to help you if your legs get weak or numb from the local anesthetic.  Take all of your medicines the morning of the procedure with just enough water to swallow them.  If you have diabetes, make sure that you are scheduled to have your procedure done first thing in the morning, whenever possible.  If you have diabetes, take only half of your insulin dose and notify our nurse that you have done so as soon as you arrive at the clinic.  If you are diabetic, but only take blood sugar pills (oral hypoglycemic), then do not take them on the morning of your procedure.  You may take them after you have had the procedure.  Do not take aspirin or any aspirin-containing medications, at least eleven (11) days prior to the procedure.  They may prolong bleeding.  Wear loose fitting clothing that may be easy to take off and that you would not mind if it got stained with Betadine or blood.  Do not wear any jewelry or perfume  Remove any nail coloring.  It will interfere with some of our monitoring equipment.  NOTE: Remember that this is not meant to be interpreted as a complete list of all possible complications.  Unforeseen problems may occur.  BLOOD THINNERS The following drugs  contain aspirin or other products, which can cause increased bleeding during surgery and should not be taken for 2 weeks prior to and 1 week after surgery.  If you should need take something for relief of minor pain, you may take acetaminophen which is found in Tylenol,m Datril, Anacin-3 and Panadol. It is not blood thinner. The products listed below are.  Do not take any of the products listed below in addition to any listed on your instruction sheet.  A.P.C or A.P.C with Codeine Codeine Phosphate Capsules #3 Ibuprofen Ridaura  ABC compound Congesprin Imuran rimadil  Advil Cope Indocin Robaxisal  Alka-Seltzer Effervescent Pain Reliever and Antacid Coricidin or Coricidin-D  Indomethacin Rufen    Alka-Seltzer plus Cold Medicine Cosprin Ketoprofen S-A-C Tablets  Anacin Analgesic Tablets or Capsules Coumadin Korlgesic Salflex  Anacin Extra Strength Analgesic tablets or capsules CP-2 Tablets Lanoril Salicylate  Anaprox Cuprimine Capsules Levenox Salocol  Anexsia-D Dalteparin Magan Salsalate  Anodynos Darvon compound Magnesium Salicylate Sine-off  Ansaid Dasin Capsules Magsal Sodium Salicylate  Anturane Depen Capsules Marnal Soma  APF Arthritis pain formula Dewitt's Pills Measurin Stanback  Argesic Dia-Gesic Meclofenamic Sulfinpyrazone  Arthritis Bayer Timed Release Aspirin Diclofenac Meclomen Sulindac  Arthritis pain formula Anacin Dicumarol Medipren Supac  Analgesic (Safety coated) Arthralgen Diffunasal Mefanamic Suprofen  Arthritis Strength Bufferin Dihydrocodeine Mepro Compound Suprol  Arthropan liquid Dopirydamole Methcarbomol with Aspirin Synalgos  ASA tablets/Enseals Disalcid Micrainin Tagament  Ascriptin Doan's Midol Talwin  Ascriptin A/D Dolene Mobidin Tanderil  Ascriptin Extra Strength Dolobid Moblgesic Ticlid  Ascriptin with Codeine Doloprin or Doloprin with Codeine Momentum Tolectin  Asperbuf Duoprin Mono-gesic Trendar  Aspergum Duradyne Motrin or Motrin IB Triminicin  Aspirin  plain, buffered or enteric coated Durasal Myochrisine Trigesic  Aspirin Suppositories Easprin Nalfon Trillsate  Aspirin with Codeine Ecotrin Regular or Extra Strength Naprosyn Uracel  Atromid-S Efficin Naproxen Ursinus  Auranofin Capsules Elmiron Neocylate Vanquish  Axotal Emagrin Norgesic Verin  Azathioprine Empirin or Empirin with Codeine Normiflo Vitamin E  Azolid Emprazil Nuprin Voltaren  Bayer Aspirin plain, buffered or children's or timed BC Tablets or powders Encaprin Orgaran Warfarin Sodium  Buff-a-Comp Enoxaparin Orudis Zorpin  Buff-a-Comp with Codeine Equegesic Os-Cal-Gesic   Buffaprin Excedrin plain, buffered or Extra Strength Oxalid   Bufferin Arthritis Strength Feldene Oxphenbutazone   Bufferin plain or Extra Strength Feldene Capsules Oxycodone with Aspirin   Bufferin with Codeine Fenoprofen Fenoprofen Pabalate or Pabalate-SF   Buffets II Flogesic Panagesic   Buffinol plain or Extra Strength Florinal or Florinal with Codeine Panwarfarin   Buf-Tabs Flurbiprofen Penicillamine   Butalbital Compound Four-way cold tablets Penicillin   Butazolidin Fragmin Pepto-Bismol   Carbenicillin Geminisyn Percodan   Carna Arthritis Reliever Geopen Persantine   Carprofen Gold's salt Persistin   Chloramphenicol Goody's Phenylbutazone   Chloromycetin Haltrain Piroxlcam   Clmetidine heparin Plaquenil   Cllnoril Hyco-pap Ponstel   Clofibrate Hydroxy chloroquine Propoxyphen         Before stopping any of these medications, be sure to consult the physician who ordered them.  Some, such as Coumadin (Warfarin) are ordered to prevent or treat serious conditions such as "deep thrombosis", "pumonary embolisms", and other heart problems.  The amount of time that you may need off of the medication may also vary with the medication and the reason for which you were taking it.  If you are taking any of these medications, please make sure you notify your pain physician before you undergo any  procedures.         Epidural Steroid Injection Patient Information  Description: The epidural space surrounds the nerves as they exit the spinal cord.  In some patients, the nerves can be compressed and inflamed by a bulging disc or a tight spinal canal (spinal stenosis).  By injecting steroids into the epidural space, we can bring irritated nerves into direct contact with a potentially helpful medication.  These steroids act directly on the irritated nerves and can reduce swelling and inflammation which often leads to decreased pain.  Epidural steroids may be injected anywhere along the spine and from the neck to the low back depending upon the location of your pain.   After numbing the skin with local anesthetic (like Novocaine), a small needle is passed   into the epidural space slowly.  You may experience a sensation of pressure while this is being done.  The entire block usually last less than 10 minutes.  Conditions which may be treated by epidural steroids:   Low back and leg pain  Neck and arm pain  Spinal stenosis  Post-laminectomy syndrome  Herpes zoster (shingles) pain  Pain from compression fractures  Preparation for the injection:  1. Do not eat any solid food or dairy products within 8 hours of your appointment.  2. You may drink clear liquids up to 3 hours before appointment.  Clear liquids include water, black coffee, juice or soda.  No milk or cream please. 3. You may take your regular medication, including pain medications, with a sip of water before your appointment  Diabetics should hold regular insulin (if taken separately) and take 1/2 normal NPH dos the morning of the procedure.  Carry some sugar containing items with you to your appointment. 4. A driver must accompany you and be prepared to drive you home after your procedure.  5. Bring all your current medications with your. 6. An IV may be inserted and sedation may be given at the discretion of the  physician.   7. A blood pressure cuff, EKG and other monitors will often be applied during the procedure.  Some patients may need to have extra oxygen administered for a short period. 8. You will be asked to provide medical information, including your allergies, prior to the procedure.  We must know immediately if you are taking blood thinners (like Coumadin/Warfarin)  Or if you are allergic to IV iodine contrast (dye). We must know if you could possible be pregnant.  Possible side-effects:  Bleeding from needle site  Infection (rare, may require surgery)  Nerve injury (rare)  Numbness & tingling (temporary)  Difficulty urinating (rare, temporary)  Spinal headache ( a headache worse with upright posture)  Light -headedness (temporary)  Pain at injection site (several days)  Decreased blood pressure (temporary)  Weakness in arm/leg (temporary)  Pressure sensation in back/neck (temporary)  Call if you experience:  Fever/chills associated with headache or increased back/neck pain.  Headache worsened by an upright position.  New onset weakness or numbness of an extremity below the injection site  Hives or difficulty breathing (go to the emergency room)  Inflammation or drainage at the infection site  Severe back/neck pain  Any new symptoms which are concerning to you  Please note:  Although the local anesthetic injected can often make your back or neck feel good for several hours after the injection, the pain will likely return.  It takes 3-7 days for steroids to work in the epidural space.  You may not notice any pain relief for at least that one week.  If effective, we will often do a series of three injections spaced 3-6 weeks apart to maximally decrease your pain.  After the initial series, we generally will wait several months before considering a repeat injection of the same type.  If you have any questions, please call [phone] [facility]  Description: The facets are joints in the spine between the vertebrae.  Like any joints in the body, facets can become irritated and painful.  Arthritis can also effect the facets.  By injecting steroids and local anesthetic in and around these joints, we can temporarily block the nerve supply to them.  Steroids act directly on irritated nerves and tissues to reduce  selling and inflammation which often leads to decreased pain.  Facet blocks may be done anywhere along the spine from the neck to the low back depending upon the location of your pain.   After numbing the skin with local anesthetic (like Novocaine), a small needle is passed onto the facet joints under x-ray guidance.  You may experience a sensation of pressure while this is being done.  The entire block usually lasts about 15-25 minutes.   Conditions which may be treated by facet blocks:   Low back/buttock pain  Neck/shoulder pain  Certain types of headaches  Preparation for the injection:  12. Do not eat any solid food or dairy products within 8 hours of your appointment. 13. You may drink clear liquid up to 3 hours before appointment.  Clear liquids include water, black coffee, juice or soda.  No milk or cream please. 14. You may take your regular medication, including pain medications, with a sip of water before your appointment.  Diabetics should hold regular insulin (if taken separately) and take 1/2 normal NPH dose the morning of the procedure.  Carry some sugar containing items with you to your appointment. 15. A driver must accompany you and be prepared to drive you home after your procedure. 56. Bring all your current medications with you. 17. An IV may be inserted and sedation may be given at the discretion of the physician. 18. A blood pressure cuff, EKG and other monitors will often be applied during the procedure.  Some patients may need to have extra oxygen  administered for a short period. 75. You will be asked to provide medical information, including your allergies and medications, prior to the procedure.  We must know immediately if you are taking blood thinners (like Coumadin/Warfarin) or if you are allergic to IV iodine contrast (dye).  We must know if you could possible be pregnant.  Possible side-effects:   Bleeding from needle site  Infection (rare, may require surgery)  Nerve injury (rare)  Numbness & tingling (temporary)  Difficulty urinating (rare, temporary)  Spinal headache (a headache worse with upright posture)  Light-headedness (temporary)  Pain at injection site (serveral days)  Decreased blood pressure (rare, temporary)  Weakness in arm/leg (temporary)  Pressure sensation in back/neck (temporary)   Call if you experience:   Fever/chills associated with headache or increased back/neck pain  Headache worsened by an upright position  New onset, weakness or numbness of an extremity below the injection site  Hives or difficulty breathing (go to the emergency room)  Inflammation or drainage at the injection site(s)  Severe back/neck pain greater than usual  New symptoms which are concerning to you  Please note:  Although the local anesthetic injected can often make your back or neck feel good for several hours after the injection, the pain will likely return. It takes 3-7 days for steroids to work.  You may not notice any pain relief for at least one week.  If effective, we will often do a series of 2-3 injections spaced 3-6 weeks apart to maximally decrease your pain.  After the initial series, you may be a candidate for a more permanent nerve block of the facets.  If you have any questions, please call #[phone] [facility]

## 2015-08-13 NOTE — Progress Notes (Signed)
Patient's Name: Austin [name] MRN: [id number] DOB: [birth year] DOS: 08/13/2015  Primary Reason(s) for Visit: Initial Patient Evaluation CC: Back Pain   HPI  Austin [name] is a 82 y.o. year old, male patient, who comes today for an initial evaluation. He has History of colonic polyps; Absolute anemia; Benign essential HTN; Benign fibroma of prostate; Atherosclerosis of coronary artery; Cataract; Esophageal stenosis; Acid reflux; Glaucoma; Hemorrhoid; HLD (hyperlipidemia); Calculus of kidney; Obstructive apnea; Borderline diabetes; Peripheral blood vessel disorder ([place]); Esophagogastric ring; Heart valve disease; MI (mitral incompetence); Cardiomyopathy, ischemic; Coronary artery disease involving native coronary artery of native heart with unstable angina pectoris ([place]); Bradycardia; Chronic low back pain (Location of Primary Source of Pain) (Bilateral) (R>L); Lumbar spondylosis; Lumbar facet syndrome (Bilateral) (R>L); Peripheral vascular disease ([place]); Lumbar central spinal stenosis (severe at L4-5); Lumbar facet hypertrophy (multilevel); Grade 1 Anterolisthesis of L3 over L4; and Lumbar foraminal stenosis (Right L2-3; Bilateral L3-4, L4-5) on his problem list.. His primarily concern today is the Back Pain   This is a patient previously known to our service who returns hoping to get another lumbar epidural steroid injection. Last one provided him with significant relief of the pain for quite some time and he did not need any pain management until recently when the pain came back.  The patient describes his primary pain to be in the lower back with the right side being worst on the left. A prior MRI done on 12/15/2009 shows multilevel degenerative disc disease and facet disease with multilevel canal stenosis which is severe at the L4-5 level. There is also some moderate to severe bilateral neural foraminal narrowing at that same level. The patient was last seen by me on 01/03/2010 at which time he came as  a "fast track". He had a left-sided L4-5 lumbar epidural steroid injection under fluoroscopic guidance with no sedation and this appears to have taking care of his pain until recently when he came back.  Reported Pain Score:  (pain with movement, bending over 9-10/10), clinically he looks like a 3-4/10. Reported level is inconsistent with clinical obrservations. Pain Type: Chronic pain Pain Location: Back Pain Orientation: Lower Pain Descriptors / Indicators: Aching, Radiating, Constant, Shooting Pain Frequency: Intermittent  Onset and Duration: Gradual, Date of onset: June 2011, approximately 8 months ago, longer than 3 months Cause of pain: Unknown Severity: Getting worse, NAS-11 at its worse: 10/10, NAS-11 at its best: 2/10, NAS-11 now: 2/10 and NAS-11 on the average: 5/10 Timing: During activity or exercise Aggravating Factors: Bending, Climbing, Prolonged standing and Walking uphill Alleviating Factors: Lying down and Resting Associated Problems: Tingling Quality of Pain: Agonizing, Punishing and Uncomfortable Previous Examinations or Tests: MRI scan, X-rays and Chiropractic evaluation Previous Treatments: Epidural steroid injections  Historic Controlled Substance Pharmacotherapy Review  Previously Prescribed Opioids:  Analgesic: Currently taking no opioids  Historical Background Evaluation: [place] PDMP: Five (5) year initial data search conducted. Historical Hospital-associated UDS Results:  No results found for: THCU, COCAINSCRNUR, PCPSCRNUR, MDMA, AMPHETMU, METHADONE, ETOH UDS Results: No UDS available, at this time UDS Interpretation: No UDS available, at this time Medication Assessment Form: Not applicable. Initial evaluation. The patient has not received any medications from our practice Treatment compliance: Not applicable. Initial evaluation Risk Assessment: Aberrant Behavior: None observed today  Opioid Fatal Overdose Risk Factors: None Substance Use Disorder (SUD) Risk  Level: Pending results of Medical Psychology Evaluation for SUD Opioid Risk Tool (ORT) Score: Total Score: 0 Low Risk for SUD (Score <3) Depression Scale Score: PHQ-2: PHQ-2 Total  Score: 0 No depression (0) PHQ-9: PHQ-9 Total Score: 0 No depression (0-4)  Pharmacologic Plan: Pending ordered tests and/or consults  Neuromodulation Therapy Review  Type: No neuromodulatory devices implanted Side-effects or Adverse reactions: No device reported Effectiveness: No device reported  Allergies  Austin [name] is allergic to penicillins.  Meds  The patient has a current medication list which includes the following prescription(s): amlodipine, aspirin, atorvastatin, biotin, vitamin d3, coenzyme q10, cyanocobalamin, glucosamine-chondroit-vit c-mn, isosorbide mononitrate, nitroglycerin, omega-3 fatty acids, omeprazole, tamsulosin, timolol, travoprost (bak free), and valsartan-hydrochlorothiazide. Requested Prescriptions    No prescriptions requested or ordered in this encounter    ROS  Cardiovascular History: Heart trouble, Daily Aspirin intake, Hypertension, Heart attack ( Date: n/a), Heart surgery and Heart murmur Pulmonary or Respiratory History: Negative for bronchial asthma, emphysema, chronic smoking, chronic bronchitis, sarcoidosis, tuberculosis or sleep apena Neurological History: Negative for epilepsy, stroke, urinary or fecal inontinence, spina bifida or tethered cord syndrome Psychological-Psychiatric History: Negative for anxiety, depression, schizophrenia, bipolar disorders or suicidal ideations or attempts Gastrointestinal History: Reflux or heatburn Genitourinary History: Nephrolithiasis Hematological History: Negative for anticoagulant therapy, anemia, bruising or bleeding easily, hemophilia, sickle cell disease or trait, thrombocytopenia or coagulupathies Endocrine History: Negative for diabetes or thyroid disease Rheumatologic History: Osteoarthritis Musculoskeletal History: Negative  for myasthenia gravis, muscular dystrophy, multiple sclerosis or malignant hyperthermia Work History: Retired  [employer]  Medical:  Austin [name]  has a past medical history of Myocardial infarction ([place]) (2011); Hypertension; Arthritis; GERD (gastroesophageal reflux disease); and Allergy. Family: family history includes Heart disease in his brother, brother, brother, brother, father, and mother. Surgical:  has past surgical history that includes Shoulder surgery (Right); Coronary artery bypass graft (2007); Cardiac catheterization (2007); Colonoscopy (N/A, 10/02/2014); Tonsillectomy; Hemorroidectomy; Carpal tunnel release (Bilateral); Cardiac catheterization (N/A, 04/17/2015); and Lumbar epidural injection (2011). Tobacco:  reports that he has quit smoking. His smoking use included Cigarettes. He quit after 15 years of use. He has never used smokeless tobacco. Alcohol:  reports that he drinks alcohol. Drug:  reports that he does not use illicit drugs.  Physical Exam  Vitals:  Today's Vitals   08/13/15 1503  BP: 154/54  Pulse: 60  Temp: 98 F (36.7 C)  TempSrc: Oral  Resp: 16  Height: 5\' 7"  (1.702 m)  Weight: 170 lb (77.111 kg)  SpO2: 100%  PainSc: 0-No pain  PainLoc: Back    Calculated BMI: Body mass index is 26.62 kg/(m^2). Overweight (25-29.9 kg/m2) - 20% higher incidence of chronic pain  General appearance: alert, cooperative, appears stated age and mild distress Eyes: PERLA Respiratory: No evidence respiratory distress, no audible rales or ronchi and no use of accessory muscles of respiration  Cervical Spine Inspection: Normal anatomy Alignment: Symetrical ROM: Adequate  Upper Extremities Inspection: No gross anomalies detected ROM: Adequate Motor: Unremarkable  Thoracic Spine Inspection: No gross anomalies detected Alignment: Symetrical ROM: Adequate  Lumbar Spine Inspection: No gross anomalies detected Alignment: Symetrical ROM: Decreased Palpation:  Tender Provocative Tests: Lumbar Hyperextension and rotation test: Positive bilaterally Patrick's Maneuver: deferred Gait: WNL  Lower Extremities Inspection: No gross anomalies detected ROM: Adequate Sensory: Normal Motor: Unremarkable  Toe walk (S1): WNL  Heal walk (L5): WNL  Assessment  Primary Diagnosis & Pertinent Problem List: The primary encounter diagnosis was Chronic low back pain (Location of Primary Source of Pain) (Bilateral) (R>L). Diagnoses of Lumbar spondylosis, unspecified spinal osteoarthritis, Lumbar facet syndrome, Lumbar central spinal stenosis (severe at L4-5), Lumbar facet hypertrophy (multilevel), Grade 1 Anterolisthesis of L3 over L4, and Lumbar foraminal  stenosis (Right L2-3; Bilateral L3-4, L4-5) were also pertinent to this visit.  Visit Diagnosis: 1. Chronic low back pain (Location of Primary Source of Pain) (Bilateral) (R>L)   2. Lumbar spondylosis, unspecified spinal osteoarthritis   3. Lumbar facet syndrome   4. Lumbar central spinal stenosis (severe at L4-5)   5. Lumbar facet hypertrophy (multilevel)   6. Grade 1 Anterolisthesis of L3 over L4   7. Lumbar foraminal stenosis (Right L2-3; Bilateral L3-4, L4-5)     Assessment: No problem-specific assessment & plan notes found for this encounter.   Plan of Care  Note: As per protocol, today's visit has been an evaluation only. We have not taken over the patient's controlled substance management.  Pharmacotherapy (Medications Ordered): No orders of the defined types were placed in this encounter.    Lab-work & Procedure Ordered: Orders Placed This Encounter  Procedures  . LUMBAR FACET(MEDIAL BRANCH NERVE BLOCK) MBNB    Standing Status: Future     Number of Occurrences:      Standing Expiration Date: 08/12/2016    Scheduling Instructions:     Side: Bilateral     Level: L2, L3, L4, L5, & S1 Medial Branch Nerve     Sedation: With Sedation.     Timeframe: ASAA    Order Specific Question:  Where  will this procedure be performed?    Answer:  [facility]    Imaging Ordered: None  Interventional Therapies: Scheduled: Left L4-5 lumbar epidural steroid injection under fluoroscopic guidance, no sedation. PRN Procedures: Diagnostic bilateral lumbar facet block under fluoroscopic guidance and IV sedation.    Referral(s) or Consult(s): None at this time.  Medications administered during this visit: Austin [name] had no medications administered during this visit.  Future Appointments Date Time Provider [place]  08/21/2015 8:40 AM Milinda Pointer, MD [facility] None  08/27/2015 9:30 AM Jerrol Banana., MD [facility] None    Primary Care Physician: Wilhemena Durie, MD Location: [facility] Note by: Kathlen Brunswick. Dossie Arbour, M.D, DABA, DABAPM, DABPM, DABIPP, FIPP

## 2015-08-13 NOTE — Progress Notes (Signed)
Safety precautions to be maintained throughout the outpatient stay will include: orient to surroundings, keep bed in low position, maintain call bell within reach at all times, provide assistance with transfer out of bed and ambulation.  

## 2015-08-14 DIAGNOSIS — M4310 Spondylolisthesis, site unspecified: Secondary | ICD-10-CM | POA: Insufficient documentation

## 2015-08-14 DIAGNOSIS — I739 Peripheral vascular disease, unspecified: Secondary | ICD-10-CM | POA: Insufficient documentation

## 2015-08-14 DIAGNOSIS — M48061 Spinal stenosis, lumbar region without neurogenic claudication: Secondary | ICD-10-CM | POA: Insufficient documentation

## 2015-08-14 DIAGNOSIS — M47816 Spondylosis without myelopathy or radiculopathy, lumbar region: Secondary | ICD-10-CM | POA: Insufficient documentation

## 2015-08-21 ENCOUNTER — Encounter: Payer: Self-pay | Admitting: Pain Medicine

## 2015-08-21 ENCOUNTER — Ambulatory Visit: Payer: Medicare Other | Attending: Pain Medicine | Admitting: Pain Medicine

## 2015-08-21 VITALS — BP 100/45 | HR 54 | Temp 97.1°F | Resp 16 | Ht 67.0 in | Wt 170.0 lb

## 2015-08-21 DIAGNOSIS — G8929 Other chronic pain: Secondary | ICD-10-CM | POA: Diagnosis not present

## 2015-08-21 DIAGNOSIS — H409 Unspecified glaucoma: Secondary | ICD-10-CM | POA: Insufficient documentation

## 2015-08-21 DIAGNOSIS — D291 Benign neoplasm of prostate: Secondary | ICD-10-CM | POA: Diagnosis not present

## 2015-08-21 DIAGNOSIS — I10 Essential (primary) hypertension: Secondary | ICD-10-CM | POA: Diagnosis not present

## 2015-08-21 DIAGNOSIS — I340 Nonrheumatic mitral (valve) insufficiency: Secondary | ICD-10-CM | POA: Insufficient documentation

## 2015-08-21 DIAGNOSIS — H269 Unspecified cataract: Secondary | ICD-10-CM | POA: Insufficient documentation

## 2015-08-21 DIAGNOSIS — K219 Gastro-esophageal reflux disease without esophagitis: Secondary | ICD-10-CM | POA: Diagnosis not present

## 2015-08-21 DIAGNOSIS — M47816 Spondylosis without myelopathy or radiculopathy, lumbar region: Secondary | ICD-10-CM | POA: Diagnosis not present

## 2015-08-21 DIAGNOSIS — D649 Anemia, unspecified: Secondary | ICD-10-CM | POA: Insufficient documentation

## 2015-08-21 DIAGNOSIS — I429 Cardiomyopathy, unspecified: Secondary | ICD-10-CM | POA: Insufficient documentation

## 2015-08-21 DIAGNOSIS — M545 Low back pain: Secondary | ICD-10-CM | POA: Diagnosis not present

## 2015-08-21 DIAGNOSIS — Z86010 Personal history of colonic polyps: Secondary | ICD-10-CM | POA: Insufficient documentation

## 2015-08-21 DIAGNOSIS — I255 Ischemic cardiomyopathy: Secondary | ICD-10-CM | POA: Insufficient documentation

## 2015-08-21 DIAGNOSIS — M4806 Spinal stenosis, lumbar region: Secondary | ICD-10-CM | POA: Diagnosis not present

## 2015-08-21 DIAGNOSIS — Z87442 Personal history of urinary calculi: Secondary | ICD-10-CM | POA: Diagnosis not present

## 2015-08-21 DIAGNOSIS — M4316 Spondylolisthesis, lumbar region: Secondary | ICD-10-CM | POA: Insufficient documentation

## 2015-08-21 DIAGNOSIS — R001 Bradycardia, unspecified: Secondary | ICD-10-CM | POA: Insufficient documentation

## 2015-08-21 DIAGNOSIS — M549 Dorsalgia, unspecified: Secondary | ICD-10-CM | POA: Diagnosis present

## 2015-08-21 DIAGNOSIS — I25110 Atherosclerotic heart disease of native coronary artery with unstable angina pectoris: Secondary | ICD-10-CM | POA: Diagnosis not present

## 2015-08-21 DIAGNOSIS — D6489 Other specified anemias: Secondary | ICD-10-CM | POA: Insufficient documentation

## 2015-08-21 DIAGNOSIS — E785 Hyperlipidemia, unspecified: Secondary | ICD-10-CM | POA: Insufficient documentation

## 2015-08-21 DIAGNOSIS — R7303 Prediabetes: Secondary | ICD-10-CM | POA: Insufficient documentation

## 2015-08-21 DIAGNOSIS — K222 Esophageal obstruction: Secondary | ICD-10-CM | POA: Insufficient documentation

## 2015-08-21 MED ORDER — TRIAMCINOLONE ACETONIDE 40 MG/ML IJ SUSP: 40.0000 mg | Freq: Once | INTRAMUSCULAR | Status: DC

## 2015-08-21 MED ORDER — FENTANYL CITRATE (PF) 100 MCG/2ML IJ SOLN
INTRAMUSCULAR | Status: AC
  Administered 2015-08-21: 50 ug via INTRAVENOUS
  Filled 2015-08-21: qty 2

## 2015-08-21 MED ORDER — FENTANYL CITRATE (PF) 100 MCG/2ML IJ SOLN: 100.0000 ug | INTRAMUSCULAR | Status: DC

## 2015-08-21 MED ORDER — TRIAMCINOLONE ACETONIDE 40 MG/ML IJ SUSP
INTRAMUSCULAR | Status: AC
  Administered 2015-08-21: 10:00:00
  Filled 2015-08-21: qty 2

## 2015-08-21 MED ORDER — MIDAZOLAM HCL 5 MG/5ML IJ SOLN: 5.0000 mg | INTRAMUSCULAR | Status: DC

## 2015-08-21 MED ORDER — ROPIVACAINE HCL 2 MG/ML IJ SOLN: 9.0000 mL | Freq: Once | INTRAMUSCULAR | Status: DC

## 2015-08-21 MED ORDER — ROPIVACAINE HCL 2 MG/ML IJ SOLN
INTRAMUSCULAR | Status: AC
  Administered 2015-08-21: 10:00:00
  Filled 2015-08-21: qty 20

## 2015-08-21 MED ORDER — LIDOCAINE HCL (PF) 1 % IJ SOLN: 10.0000 mL | Freq: Once | INTRAMUSCULAR | Status: DC

## 2015-08-21 MED ORDER — MIDAZOLAM HCL 5 MG/5ML IJ SOLN
INTRAMUSCULAR | Status: AC
  Administered 2015-08-21: 1 mg via INTRAVENOUS
  Filled 2015-08-21: qty 5

## 2015-08-21 MED ORDER — LACTATED RINGERS IV SOLN: 1000.0000 mL | INTRAVENOUS | Status: AC

## 2015-08-21 NOTE — Patient Instructions (Signed)
Pain Management Discharge Instructions  General Discharge Instructions :  If you need to reach your doctor call: Monday-Friday 8:00 am - 4:00 pm at [phone] or toll free [phone].  After clinic hours [phone] to have operator reach doctor.  Bring all of your medication bottles to all your appointments in the pain clinic.  To cancel or reschedule your appointment with Pain Management please remember to call 24 hours in advance to avoid a fee.  Refer to the educational materials which you have been given on: General Risks, I had my Procedure. Discharge Instructions, Post Sedation.  Post Procedure Instructions:  The drugs you were given will stay in your system until tomorrow, so for the next 24 hours you should not drive, make any legal decisions or drink any alcoholic beverages.  You may eat anything you prefer, but it is better to start with liquids then soups and crackers, and gradually work up to solid foods.  Please notify your doctor immediately if you have any unusual bleeding, trouble breathing or pain that is not related to your normal pain.  Depending on the type of procedure that was done, some parts of your body may feel week and/or numb.  This usually clears up by tonight or the next day.  Walk with the use of an assistive device or accompanied by an adult for the 24 hours.  You may use ice on the affected area for the first 24 hours.  Put ice in a Ziploc bag and cover with a towel and place against area 15 minutes on 15 minutes off.  You may switch to heat after 24 hours.GENERAL RISKS AND COMPLICATIONS  What are the risk, side effects and possible complications? Generally speaking, most procedures are safe.  However, with any procedure there are risks, side effects, and the possibility of complications.  The risks and complications are dependent upon the sites that are lesioned, or the type of nerve block to be performed.  The closer the procedure is to the spine,  the more serious the risks are.  Great care is taken when placing the radio frequency needles, block needles or lesioning probes, but sometimes complications can occur. 1. Infection: Any time there is an injection through the skin, there is a risk of infection.  This is why sterile conditions are used for these blocks.  There are four possible types of infection. 1. Localized skin infection. 2. Central Nervous System Infection-This can be in the form of Meningitis, which can be deadly. 3. Epidural Infections-This can be in the form of an epidural abscess, which can cause pressure inside of the spine, causing compression of the spinal cord with subsequent paralysis. This would require an emergency surgery to decompress, and there are no guarantees that the patient would recover from the paralysis. 4. Discitis-This is an infection of the intervertebral discs.  It occurs in about 1% of discography procedures.  It is difficult to treat and it may lead to surgery.        2. Pain: the needles have to go through skin and soft tissues, will cause soreness.       3. Damage to internal structures:  The nerves to be lesioned may be near blood vessels or    other nerves which can be potentially damaged.       4. Bleeding: Bleeding is more common if the patient is taking blood thinners such as  aspirin, Coumadin, Ticiid, Plavix, etc., or if he/she have some genetic predisposition  such as   hemophilia. Bleeding into the spinal canal can cause compression of the spinal  cord with subsequent paralysis.  This would require an emergency surgery to  decompress and there are no guarantees that the patient would recover from the  paralysis.       5. Pneumothorax:  Puncturing of a lung is a possibility, every time a needle is introduced in  the area of the chest or upper back.  Pneumothorax refers to free air around the  collapsed lung(s), inside of the thoracic cavity (chest cavity).  Another two possible  complications  related to a similar event would include: Hemothorax and Chylothorax.   These are variations of the Pneumothorax, where instead of air around the collapsed  lung(s), you may have blood or chyle, respectively.       6. Spinal headaches: They may occur with any procedures in the area of the spine.       7. Persistent CSF (Cerebro-Spinal Fluid) leakage: This is a rare problem, but may occur  with prolonged intrathecal or epidural catheters either due to the formation of a fistulous  track or a dural tear.       8. Nerve damage: By working so close to the spinal cord, there is always a possibility of  nerve damage, which could be as serious as a permanent spinal cord injury with  paralysis.       9. Death:  Although rare, severe deadly allergic reactions known as "Anaphylactic  reaction" can occur to any of the medications used.      10. Worsening of the symptoms:  We can always make thing worse.  What are the chances of something like this happening? Chances of any of this occuring are extremely low.  By statistics, you have more of a chance of getting killed in a motor vehicle accident: while driving to the hospital than any of the above occurring .  Nevertheless, you should be aware that they are possibilities.  In general, it is similar to taking a shower.  Everybody knows that you can slip, hit your head and get killed.  Does that mean that you should not shower again?  Nevertheless always keep in mind that statistics do not mean anything if you happen to be on the wrong side of them.  Even if a procedure has a 1 (one) in a 1,000,000 (million) chance of going wrong, it you happen to be that one..Also, keep in mind that by statistics, you have more of a chance of having something go wrong when taking medications.  Who should not have this procedure? If you are on a blood thinning medication (e.g. Coumadin, Plavix, see list of "Blood Thinners"), or if you have an active infection going on, you should not  have the procedure.  If you are taking any blood thinners, please inform your physician.  How should I prepare for this procedure?  Do not eat or drink anything at least six hours prior to the procedure.  Bring a driver with you .  It cannot be a taxi.  Come accompanied by an adult that can drive you back, and that is strong enough to help you if your legs get weak or numb from the local anesthetic.  Take all of your medicines the morning of the procedure with just enough water to swallow them.  If you have diabetes, make sure that you are scheduled to have your procedure done first thing in the morning, whenever possible.  If you have diabetes,   take only half of your insulin dose and notify our nurse that you have done so as soon as you arrive at the clinic.  If you are diabetic, but only take blood sugar pills (oral hypoglycemic), then do not take them on the morning of your procedure.  You may take them after you have had the procedure.  Do not take aspirin or any aspirin-containing medications, at least eleven (11) days prior to the procedure.  They may prolong bleeding.  Wear loose fitting clothing that may be easy to take off and that you would not mind if it got stained with Betadine or blood.  Do not wear any jewelry or perfume  Remove any nail coloring.  It will interfere with some of our monitoring equipment.  NOTE: Remember that this is not meant to be interpreted as a complete list of all possible complications.  Unforeseen problems may occur.  BLOOD THINNERS The following drugs contain aspirin or other products, which can cause increased bleeding during surgery and should not be taken for 2 weeks prior to and 1 week after surgery.  If you should need take something for relief of minor pain, you may take acetaminophen which is found in Tylenol,m Datril, Anacin-3 and Panadol. It is not blood thinner. The products listed below are.  Do not take any of the products listed below  in addition to any listed on your instruction sheet.  A.P.C or A.P.C with Codeine Codeine Phosphate Capsules #3 Ibuprofen Ridaura  ABC compound Congesprin Imuran rimadil  Advil Cope Indocin Robaxisal  Alka-Seltzer Effervescent Pain Reliever and Antacid Coricidin or Coricidin-D  Indomethacin Rufen  Alka-Seltzer plus Cold Medicine Cosprin Ketoprofen S-A-C Tablets  Anacin Analgesic Tablets or Capsules Coumadin Korlgesic Salflex  Anacin Extra Strength Analgesic tablets or capsules CP-2 Tablets Lanoril Salicylate  Anaprox Cuprimine Capsules Levenox Salocol  Anexsia-D Dalteparin Magan Salsalate  Anodynos Darvon compound Magnesium Salicylate Sine-off  Ansaid Dasin Capsules Magsal Sodium Salicylate  Anturane Depen Capsules Marnal Soma  APF Arthritis pain formula Dewitt's Pills Measurin Stanback  Argesic Dia-Gesic Meclofenamic Sulfinpyrazone  Arthritis Bayer Timed Release Aspirin Diclofenac Meclomen Sulindac  Arthritis pain formula Anacin Dicumarol Medipren Supac  Analgesic (Safety coated) Arthralgen Diffunasal Mefanamic Suprofen  Arthritis Strength Bufferin Dihydrocodeine Mepro Compound Suprol  Arthropan liquid Dopirydamole Methcarbomol with Aspirin Synalgos  ASA tablets/Enseals Disalcid Micrainin Tagament  Ascriptin Doan's Midol Talwin  Ascriptin A/D Dolene Mobidin Tanderil  Ascriptin Extra Strength Dolobid Moblgesic Ticlid  Ascriptin with Codeine Doloprin or Doloprin with Codeine Momentum Tolectin  Asperbuf Duoprin Mono-gesic Trendar  Aspergum Duradyne Motrin or Motrin IB Triminicin  Aspirin plain, buffered or enteric coated Durasal Myochrisine Trigesic  Aspirin Suppositories Easprin Nalfon Trillsate  Aspirin with Codeine Ecotrin Regular or Extra Strength Naprosyn Uracel  Atromid-S Efficin Naproxen Ursinus  Auranofin Capsules Elmiron Neocylate Vanquish  Axotal Emagrin Norgesic Verin  Azathioprine Empirin or Empirin with Codeine Normiflo Vitamin E  Azolid Emprazil Nuprin Voltaren  Bayer  Aspirin plain, buffered or children's or timed BC Tablets or powders Encaprin Orgaran Warfarin Sodium  Buff-a-Comp Enoxaparin Orudis Zorpin  Buff-a-Comp with Codeine Equegesic Os-Cal-Gesic   Buffaprin Excedrin plain, buffered or Extra Strength Oxalid   Bufferin Arthritis Strength Feldene Oxphenbutazone   Bufferin plain or Extra Strength Feldene Capsules Oxycodone with Aspirin   Bufferin with Codeine Fenoprofen Fenoprofen Pabalate or Pabalate-SF   Buffets II Flogesic Panagesic   Buffinol plain or Extra Strength Florinal or Florinal with Codeine Panwarfarin   Buf-Tabs Flurbiprofen Penicillamine   Butalbital Compound Four-way cold tablets   Penicillin   Butazolidin Fragmin Pepto-Bismol   Carbenicillin Geminisyn Percodan   Carna Arthritis Reliever Geopen Persantine   Carprofen Gold's salt Persistin   Chloramphenicol Goody's Phenylbutazone   Chloromycetin Haltrain Piroxlcam   Clmetidine heparin Plaquenil   Cllnoril Hyco-pap Ponstel   Clofibrate Hydroxy chloroquine Propoxyphen         Before stopping any of these medications, be sure to consult the physician who ordered them.  Some, such as Coumadin (Warfarin) are ordered to prevent or treat serious conditions such as "deep thrombosis", "pumonary embolisms", and other heart problems.  The amount of time that you may need off of the medication may also vary with the medication and the reason for which you were taking it.  If you are taking any of these medications, please make sure you notify your pain physician before you undergo any procedures.         Facet Joint Block, Care After Refer to this sheet in the next few weeks. These instructions provide you with information on caring for yourself after your procedure. Your health care provider may also give you more specific instructions. Your treatment has been planned according to current medical practices, but problems sometimes occur. Call your health care provider if you have any problems  or questions after your procedure. HOME CARE INSTRUCTIONS  2. Keep track of the amount of pain relief you feel and how long it lasts. 3. Limit pain medicine within the first 4-6 hours after the procedure as directed by your health care provider. 4. Resume taking dietary supplements and medicines as directed by your health care provider. 5. You may resume your regular diet. 6. Do not apply heat near or over the injection site(s) for 24 hours.  7. Do not take a bath or soak in water (such as a pool or lake) for 24 hours. 8. Do not drive for 24 hours unless approved by your health care provider. 9. Avoid strenuous activity for 24 hours. 10. Remove your bandages the morning after the procedure.  11. If the injection site is tender, applying an ice pack may relieve some tenderness. To do this: 1. Put ice in a bag. 2. Place a towel between your skin and the bag. 3. Leave the ice on for 15-20 minutes, 3-4 times a day. 12. Keep follow-up appointments as directed by your health care provider. SEEK MEDICAL CARE IF:   Your pain is not controlled by your medicines.   There is drainage from the injection site.   There is significant bleeding or swelling at the injection site.  You have diabetes and your blood sugar is above 180 mg/dL. SEEK IMMEDIATE MEDICAL CARE IF:   You develop a fever of 101F (38.3C) or greater.   You have worsening pain or swelling around the injection site.   You have red streaking around the injection site.   You develop severe pain that is not controlled by your medicines.   You develop a headache, stiff neck, nausea, or vomiting.   Your eyes become very sensitive to light.   You have weakness, paralysis, or tingling in your arms or legs that was not present before the procedure.   You develop difficulty urinating or breathing.    This information is not intended to replace advice given to you by your health care provider. Make sure you discuss any  questions you have with your health care provider.   Document Released: 05/05/2012 Document Revised: 06/09/2014 Document Reviewed: 05/05/2012 Elsevier Interactive Patient Education 2016   [employer] Facet Joint Block The facet joints connect the bones of the spine (vertebrae). They make it possible for you to bend, twist, and make other movements with your spine. They also prevent you from overbending, overtwisting, and making other excessive movements.  A facet joint block is a procedure where a numbing medicine (anesthetic) is injected into a facet joint. Often, a type of anti-inflammatory medicine called a steroid is also injected. A facet joint block may be done for two reasons:  13. Diagnosis. A facet joint block may be done as a test to see whether neck or back pain is caused by a worn-down or infected facet joint. If the pain gets better after a facet joint block, it means the pain is probably coming from the facet joint. If the pain does not get better, it means the pain is probably not coming from the facet joint.  14. Therapy. A facet joint block may be done to relieve neck or back pain caused by a facet joint. A facet joint block is only done as a therapy if the pain does not improve with medicine, exercise programs, physical therapy, and other forms of pain management. LET YOUR HEALTH CARE PROVIDER KNOW ABOUT:   Any allergies you have.   All medicines you are taking, including vitamins, herbs, eyedrops, and over-the-counter medicines and creams.   Previous problems you or members of your family have had with the use of anesthetics.   Any blood disorders you have had.   Other health problems you have. RISKS AND COMPLICATIONS Generally, having a facet joint block is safe. However, as with any procedure, complications can occur. Possible complications associated with having a facet joint block include:   Bleeding.   Injury to a nerve near the injection site.   Pain at the  injection site.   Weakness or numbness in areas controlled by nerves near the injection site.   Infection.   Temporary fluid retention.   Allergic reaction to anesthetics or medicines used during the procedure. BEFORE THE PROCEDURE   Follow your health care provider's instructions if you are taking dietary supplements or medicines. You may need to stop taking them or reduce your dosage.   Do not take any new dietary supplements or medicines without asking your health care provider first.   Follow your health care provider's instructions about eating and drinking before the procedure. You may need to stop eating and drinking several hours before the procedure.   Arrange to have an adult drive you home after the procedure. PROCEDURE 12. You may need to remove your clothing and dress in an open-back gown so that your health care provider can access your spine.  13. The procedure will be done while you are lying on an X-ray table. Most of the time you will be asked to lie on your stomach, but you may be asked to lie in a different position if an injection will be made in your neck.  14. Special machines will be used to monitor your oxygen levels, heart rate, and blood pressure.  15. If an injection will be made in your neck, an intravenous (IV) tube will be inserted into one of your veins. Fluids and medicine will flow directly into your body through the IV tube.  16. The area over the facet joint where the injection will be made will be cleaned with an antiseptic soap. The surrounding skin will be covered with sterile drapes.  17. An anesthetic will be applied to   your skin to make the injection area numb. You may feel a temporary stinging or burning sensation.  18. A video X-ray machine will be used to locate the joint. A contrast dye may be injected into the facet joint area to help with locating the joint.  19. When the joint is located, an anesthetic medicine will be injected  into the joint through the needle.  20. Your health care provider will ask you whether you feel pain relief. If you do feel relief, a steroid may be injected to provide pain relief for a longer period of time. If you do not feel relief or feel only partial relief, additional injections of an anesthetic may be made in other facet joints.  21. The needle will be removed, the skin will be cleansed, and bandages will be applied.  AFTER THE PROCEDURE   You will be observed for 15-30 minutes before being allowed to go home. Do not drive. Have an adult drive you or take a taxi or public transportation instead.   If you feel pain relief, the pain will return in several hours or days when the anesthetic wears off.   You may feel pain relief 2-14 days after the procedure. The amount of time this relief lasts varies from person to person.   It is normal to feel some tenderness over the injected area(s) for 2 days following the procedure.   If you have diabetes, you may have a temporary increase in blood sugar.   This information is not intended to replace advice given to you by your health care provider. Make sure you discuss any questions you have with your health care provider.   Document Released: 10/08/2006 Document Revised: 06/09/2014 Document Reviewed: 03/08/2012 Elsevier Interactive Patient Education 2016 [employer]  

## 2015-08-21 NOTE — Progress Notes (Signed)
Safety precautions to be maintained throughout the outpatient stay will include: orient to surroundings, keep bed in low position, maintain call bell within reach at all times, provide assistance with transfer out of bed and ambulation.  

## 2015-08-21 NOTE — Progress Notes (Signed)
Patient's Name: Austin [name] MRN: [id number] DOB: 11/14/1928 DOS: 08/21/2015  Primary Reason(s) for Visit: Interventional Pain Management Treatment. CC: Back Pain   Pre-Procedure Assessment:  Austin [name] is a 82 y.o. year old, male patient, seen today for interventional treatment. He has History of colonic polyps; Absolute anemia; Benign essential HTN; Benign fibroma of prostate; Atherosclerosis of coronary artery; Cataract; Esophageal stenosis; Acid reflux; Glaucoma; Hemorrhoid; HLD (hyperlipidemia); Calculus of kidney; Obstructive apnea; Borderline diabetes; Peripheral blood vessel disorder (HCC); Esophagogastric ring; Heart valve disease; MI (mitral incompetence); Cardiomyopathy, ischemic; Coronary artery disease involving native coronary artery of native heart with unstable angina pectoris (HCC); Bradycardia; Chronic low back pain (Location of Primary Source of Pain) (Bilateral) (R>L); Lumbar spondylosis; Lumbar facet syndrome (Bilateral) (R>L); Peripheral vascular disease (HCC); Lumbar central spinal stenosis (severe at L4-5); Lumbar facet hypertrophy (multilevel); Grade 1 Anterolisthesis of L3 over L4; Lumbar foraminal stenosis (Right L2-3; Bilateral L3-4, L4-5); and Chronic pain on his problem list.. His primarily concern today is the Back Pain   Today's Initial Pain Score: 8/10 Reported level of pain is incompatible with clinical obrservations. This may be secondary to a possible lack of understanding on how the pain scale works. Pain Type: Chronic pain Pain Location: Back Pain Orientation: Lower Pain Descriptors / Indicators: Aching, Constant, Shooting Pain Frequency: Constant  Post-procedure Pain Score: 0-No pain  Date of Last Visit: 08/13/15 Service Provided on Last Visit: Evaluation  Verification of the correct person, correct site (including marking of site), and correct procedure were performed and confirmed by the patient.  Today's Vitals   08/21/15 1002 08/21/15 1012  08/21/15 1022 08/21/15 1031  BP: 107/54 109/50 120/51 100/45  Pulse: 67 59 64 54  Temp:    97.1 F (36.2 C)  Resp: 16 16 16 16  Height:      Weight:      SpO2: 100% 100% 100% 99%  PainSc:    0-No pain  PainLoc:      Calculated BMI: Body mass index is 26.62 kg/(m^2). Allergies: He is allergic to penicillins.. Primary Diagnosis: Facet syndrome, lumbar [M54.5]  Procedure:  Type: Diagnostic Medial Branch Facet Block Region: Lumbar Level: L2, L3, L4, L5, & S1 Medial Branch Level(s) Laterality: Bilateral  Indications: 1. Lumbar facet syndrome (Bilateral) (R>L)   2. Chronic low back pain (Location of Primary Source of Pain) (Bilateral) (R>L)   3. Lumbar spondylosis, unspecified spinal osteoarthritis   4. Chronic pain     In addition, Austin [name] has Chronic low back pain (Location of Primary Source of Pain) (Bilateral) (R>L); Lumbar spondylosis; Lumbar facet syndrome (Bilateral) (R>L); Lumbar central spinal stenosis (severe at L4-5); Lumbar facet hypertrophy (multilevel); Grade 1 Anterolisthesis of L3 over L4; Lumbar foraminal stenosis (Right L2-3; Bilateral L3-4, L4-5); and Chronic pain on his pertinent problem list.  Consent: Secured. Under the influence of no sedatives a written informed consent was obtained, after having provided information on the risks and possible complications. To fulfill our ethical and legal obligations, as recommended by the American Medical Association's Code of Ethics, we have provided information to the patient about our clinical impression; the nature and purpose of the treatment or procedure; the risks, benefits, and possible complications of the intervention; alternatives; the risk(s) and benefit(s) of the alternative treatment(s) or procedure(s); and the risk(s) and benefit(s) of doing nothing. The patient was provided information about the risks and possible complications associated with the procedure. In the case of spinal procedures these may include, but  are not limited to, failure   to achieve desired goals, infection, bleeding, organ or nerve damage, allergic reactions, paralysis, and death. In addition, the patient was informed that Medicine is not an exact science; therefore, there is also the possibility of unforeseen risks and possible complications that may result in a catastrophic outcome. The patient indicated having understood very clearly. We have given the patient no guarantees and we have made no promises. Enough time was given to the patient to ask questions, all of which were answered to the patient's satisfaction.  Pre-Procedure Preparation: Safety Precautions: Allergies reviewed. Appropriate site, procedure, and patient were confirmed by following the Joint Commission's Universal Protocol (UP.01.01.01), in the form of a "Time Out". The patient was asked to confirm marked site and procedure, before commencing. The patient was asked about blood thinners, or active infections, both of which were denied. Patient was assessed for positional comfort and all pressure points were checked before starting procedure. Monitoring:  As per clinic protocol. Infection Control Precautions: Sterile technique used. Standard Universal Precautions were taken as recommended by the Department of [facility] for Disease Control and Prevention (CDC). Standard pre-surgical skin prep was conducted. Respiratory hygiene and cough etiquette was practiced. Hand hygiene observed. Safe injection practices and needle disposal techniques followed. SDV (single dose vial) medications used. Medications properly checked for expiration dates and contaminants. Personal protective equipment (PPE) used: Sterile double glove technique. Radiation resistant gloves. Sterile surgical gloves.  Anesthesia, Analgesia, Anxiolysis: Type: Moderate (Conscious) Sedation & Local Anesthesia Local Anesthetic: Lidocaine 1% Route: Intravenous (IV) IV Access: Secured Sedation: Meaningful  verbal contact was maintained at all times during the procedure  Indication(s): Analgesia & Anxiolysis  Description of Procedure Process:  Time-out: "Time-out" completed before starting procedure, as per protocol. Position: Prone Target Area: For Lumbar Facet blocks, the target is the groove formed by the junction of the transverse process and superior articular process. For the L5 dorsal ramus, the target is the notch between superior articular process and sacral ala. For the S1 dorsal ramus, the target is the superior and lateral edge of the posterior S1 Sacral foramen. Approach: Paramedial approach. Area Prepped: Entire Posterior Lumbosacral Region Prepping solution: ChloraPrep (2% chlorhexidine gluconate and 70% isopropyl alcohol) Safety Precautions: Aspiration looking for blood return was conducted prior to all injections. At no point did we inject any substances, as a needle was being advanced. No attempts were made at seeking any paresthesias. Safe injection practices and needle disposal techniques used. Medications properly checked for expiration dates. SDV (single dose vial) medications used.   Description of the Procedure: Protocol guidelines were followed. The patient was placed in position over the fluoroscopy table. The target area was identified and the area prepped in the usual manner. Skin desensitized using vapocoolant spray. Skin & deeper tissues infiltrated with local anesthetic. Appropriate amount of time allowed to pass for local anesthetics to take effect. The procedure needle was introduced through the skin, ipsilateral to the reported pain, and advanced to the target area. Employing the "Medial Branch Technique", the needles were advanced to the angle made by the superior and medial portion of the transverse process, and the lateral and inferior portion of the superior articulating process of the targeted vertebral bodies. This area is known as "Burton's Eye" or the "Eye of the  [place] Dog". A procedure needle was introduced through the skin, and this time advanced to the angle made by the superior and medial border of the sacral ala, and the lateral border of the S1 vertebral body. This last  needle was later repositioned at the superior and lateral border of the posterior S1 foramen. Negative aspiration confirmed. Solution injected in intermittent fashion, asking for systemic symptoms every 0.5cc of injectate. The needles were then removed and the area cleansed, making sure to leave some of the prepping solution back to take advantage of its long term bactericidal properties. EBL: None Materials & Medications Used:  Needle(s) Used: 22g - 3.5" Spinal Needle(s) Medications Administered today: We administered ropivacaine (PF) 2 mg/ml (0.2%), fentaNYL, triamcinolone acetonide, and midazolam.Please see chart orders for dosing details.  Imaging Guidance:  Type of Imaging Technique: Fluoroscopy Guidance (Spinal) Indication(s): Assistance in needle guidance and placement for procedures requiring needle placement in or near specific anatomical locations not easily accessible without such assistance. Exposure Time: Please see nurses notes. Contrast: None required. Fluoroscopic Guidance: I was personally present in the fluoroscopy suite, where the patient was placed in position for the procedure, over the fluoroscopy-compatible table. Fluoroscopy was manipulated, using "Tunnel Vision Technique", to obtain the best possible view of the target area, on the affected side. Parallax error was corrected before commencing the procedure. A "direction-depth-direction" technique was used to introduce the needle under continuous pulsed fluoroscopic guidance. Once the target was reached, antero-posterior, oblique, and lateral fluoroscopic projection views were taken to confirm needle placement in all planes. Permanently recorded images stored by scanning into EMR. Interpretation: Intraoperative  imaging interpretation by performing Physician. Adequate needle placement confirmed. Adequate needle placement confirmed in AP, lateral, & Oblique Views. No contrast injected.  Antibiotic Prophylaxis:  Indication(s): No indications identified. Type:  Antibiotics Given (last 72 hours)    None       Post-operative Assessment:  Complications: No immediate post-treatment complications were observed. Disposition: Return to clinic for follow-up evaluation. The patient tolerated the entire procedure well. A repeat set of vitals were taken after the procedure and the patient was kept under observation following institutional policy, for this procedure. Post-procedural neurological assessment was performed, showing return to baseline, prior to discharge. The patient was discharged home, once institutional criteria were met. The patient was provided with post-procedure discharge instructions, including a section on how to identify potential problems. Should any problems arise concerning this procedure, the patient was given instructions to immediately contact us, at any time, without hesitation. In any case, we plan to contact the patient by telephone for a follow-up status report regarding this interventional procedure. Comments:  No additional relevant information.  Medications administered during this visit: We administered ropivacaine (PF) 2 mg/ml (0.2%), fentaNYL, triamcinolone acetonide, and midazolam.  Prescriptions ordered during this visit: New Prescriptions   No medications on file    Future Appointments Date Time Provider [place]  08/27/2015 9:30 AM Jerrol Banana., MD [facility] None  09/06/2015 1:00 PM Milinda Pointer, MD [facility] None    Primary Care Physician: Wilhemena Durie, MD Location: [facility] Note by: Kathlen Brunswick. Dossie Arbour, M.D, DABA, DABAPM, DABPM, DABIPP, FIPP   Illustration of the posterior view of the lumbar spine and the  posterior neural structures. Laminae of L2 through S1 are labeled. DPRL5, dorsal primary ramus of L5; DPRS1, dorsal primary ramus of S1; DPR3, dorsal primary ramus of L3; FJ, facet (zygapophyseal) joint L3-L4; I, inferior articular process of L4; LB1, lateral branch of dorsal primary ramus of L1; IAB, inferior articular branches from L3 medial branch (supplies L4-L5 facet joint); IBP, intermediate branch plexus; MB3, medial branch of dorsal primary ramus of L3; NR3, third lumbar nerve root; S, superior  articular process of L5; SAB, superior articular branches from L4 (supplies L4-5 facet joint also); TP3, transverse process of L3.  Disclaimer:  Medicine is not an exact science. The only guarantee in medicine is that nothing is guaranteed. It is important to note that the decision to proceed with this intervention was based on the information collected from the patient. The Data and conclusions were drawn from the patient's questionnaire, the interview, and the physical examination. Because the information was provided in large part by the patient, it cannot be guaranteed that it has not been purposely or unconsciously manipulated. Every effort has been made to obtain as much relevant data as possible for this evaluation. It is important to note that the conclusions that lead to this procedure are derived in large part from the available data. Always take into account that the treatment will also be dependent on availability of resources and existing treatment guidelines, considered by other Pain Management Practitioners as being common knowledge and practice, at the time of the intervention. For Medico-Legal purposes, it is also important to point out that variation in procedural techniques and pharmacological choices are the acceptable norm. The indications, contraindications, technique, and results of the above procedure should only be interpreted and judged by a Board-Certified Interventional Pain Specialist  with extensive familiarity and expertise in the same exact procedure and technique. Attempts at providing opinions without similar or greater experience and expertise than that of the treating physician will be considered as inappropriate and unethical, and shall result in a formal complaint to the state medical board and applicable specialty societies. 

## 2015-08-22 ENCOUNTER — Telehealth: Payer: Self-pay | Admitting: *Deleted

## 2015-08-22 NOTE — Telephone Encounter (Signed)
No problems post procedure. 

## 2015-08-27 ENCOUNTER — Ambulatory Visit (INDEPENDENT_AMBULATORY_CARE_PROVIDER_SITE_OTHER): Payer: Medicare Other | Admitting: Family Medicine

## 2015-08-27 ENCOUNTER — Encounter: Payer: Self-pay | Admitting: Family Medicine

## 2015-08-27 VITALS — BP 138/52 | HR 62 | Temp 98.4°F | Resp 14 | Wt 174.0 lb

## 2015-08-27 DIAGNOSIS — M545 Low back pain: Secondary | ICD-10-CM

## 2015-08-27 DIAGNOSIS — R739 Hyperglycemia, unspecified: Secondary | ICD-10-CM

## 2015-08-27 DIAGNOSIS — E785 Hyperlipidemia, unspecified: Secondary | ICD-10-CM

## 2015-08-27 DIAGNOSIS — I10 Essential (primary) hypertension: Secondary | ICD-10-CM | POA: Diagnosis not present

## 2015-08-27 DIAGNOSIS — G8929 Other chronic pain: Secondary | ICD-10-CM | POA: Diagnosis not present

## 2015-08-27 DIAGNOSIS — I25110 Atherosclerotic heart disease of native coronary artery with unstable angina pectoris: Secondary | ICD-10-CM | POA: Diagnosis not present

## 2015-08-27 DIAGNOSIS — L989 Disorder of the skin and subcutaneous tissue, unspecified: Secondary | ICD-10-CM

## 2015-08-27 HISTORY — DX: Hyperglycemia, unspecified: R73.9

## 2015-08-27 LAB — POCT GLYCOSYLATED HEMOGLOBIN (HGB A1C): Hemoglobin A1C: 6.2

## 2015-08-27 NOTE — Progress Notes (Signed)
Patient ID: Austin [name], male   DOB: 09-06-1928, 82 y.o.   MRN: [id number]    Subjective:  HPI  Hypertension, follow-up:  BP Readings from Last 3 Encounters:  08/27/15 138/52  08/21/15 100/45  08/13/15 154/54    He was last seen for hypertension 6 months ago.  BP at that visit was 100/45. Management since that visit includes none. He reports good compliance with treatment. He is not having side effects.  He is exercising. He is adherent to low salt diet.   Outside blood pressures are 120's/50's. He is experiencing dyspnea. Pt has been seen and had work up by cardiologist for this back in November.  Patient denies chest pain, chest pressure/discomfort, claudication, exertional chest pressure/discomfort, fatigue, irregular heart beat, lower extremity edema, near-syncope, orthopnea and palpitations.    Wt Readings from Last 3 Encounters:  08/27/15 174 lb (78.926 kg)  08/21/15 170 lb (77.111 kg)  08/13/15 170 lb (77.111 kg)   ------------------------------------------------------------------------  Pre- Diabetes, Follow-up:   Lab Results  Component Value Date   HGBA1C 5.6 04/30/2015   HGBA1C 6.4* 01/08/2015   HGBA1C 5.5 12/14/2013    Last seen for diabetes 6 months ago.  Management since then includes none. He reports good compliance with treatment. He is not having side effects.   Current exercise: walking and yard work  Pertinent Labs:    Component Value Date/Time   CHOL 170 01/08/2015 0849   CHOL 172 12/14/2013   TRIG 136 01/08/2015 0849   HDL 47 01/08/2015 0849   HDL 49 12/14/2013   LDLCALC 96 01/08/2015 0849   LDLCALC 102 12/14/2013   CREATININE 1.66* 01/08/2015 0849   CREATININE 1.5* 12/14/2013   CREATININE 1.50* 05/09/2013 0912    Wt Readings from Last 3 Encounters:  08/27/15 174 lb (78.926 kg)  08/21/15 170 lb (77.111 kg)  08/13/15 170 lb (77.111 kg)    ------------------------------------------------------------------------      Prior  to Admission medications   Medication Sig Start Date End Date Taking? Authorizing Provider  amLODipine (NORVASC) 5 MG tablet Take 5 mg by mouth daily.   Yes Historical Provider, MD  aspirin 81 MG tablet Take by mouth daily.  08/12/11  Yes Historical Provider, MD  atorvastatin (LIPITOR) 20 MG tablet Take by mouth daily.  04/24/14  Yes Historical Provider, MD  azithromycin (ZITHROMAX) 250 MG tablet Reported on 08/21/2015 07/16/15  Yes Historical Provider, MD  Biotin 5000 MCG CAPS Take by mouth daily.    Yes Historical Provider, MD  chlorhexidine (PERIDEX) 0.12 % solution Reported on 08/21/2015 07/16/15  Yes Historical Provider, MD  Cholecalciferol (VITAMIN D3) 2000 UNITS capsule Take 2,000 Units by mouth 2 (two) times daily.    Yes Historical Provider, MD  COENZYME Q-10 PO Take 200 mg by mouth daily.  08/12/11  Yes Historical Provider, MD  Cyanocobalamin 100 MCG LOZG Take 1,000 mcg by mouth daily.    Yes Historical Provider, MD  diflunisal (DOLOBID) 500 MG TABS tablet Reported on 08/21/2015 07/16/15  Yes Historical Provider, MD  dorzolamide-timolol (COSOPT) 22.3-6.8 MG/ML ophthalmic solution  07/03/15  Yes Historical Provider, MD  GLUCOSAMINE-CHONDROIT-VIT C-MN PO Take 2 tablets by mouth daily.  08/12/11  Yes Historical Provider, MD  isosorbide mononitrate (IMDUR) 30 MG 24 hr tablet Take by mouth. 08/12/11  Yes Historical Provider, MD  nitroGLYCERIN (NITROSTAT) 0.4 MG SL tablet Place under the tongue every 5 (five) minutes as needed.  02/25/11  Yes Historical Provider, MD  OMEGA-3 FATTY ACIDS PO Take 1,200 mcg by  mouth 2 (two) times daily.  02/25/11  Yes Historical Provider, MD  omeprazole (PRILOSEC) 20 MG capsule Reported on 08/21/2015 07/28/15  Yes Historical Provider, MD  Omeprazole 20 MG TBEC Take by mouth daily. Reported on 08/21/2015 02/25/11  Yes Historical Provider, MD  tamsulosin (FLOMAX) 0.4 MG CAPS capsule TAKE 1 CAPSULE BY MOUTH EVERY DAY 11/06/14  Yes Jerrol Banana., MD  timolol (BETIMOL) 0.5 %  ophthalmic solution Place 1 drop into both eyes 2 (two) times daily.   Yes Historical Provider, MD  valsartan-hydrochlorothiazide (DIOVAN HCT) 320-25 MG per tablet Take 1 tablet by mouth daily.    Yes Historical Provider, MD  Travoprost, BAK Free, (TRAVATAN Z) 0.004 % SOLN ophthalmic solution Place 1 drop into both eyes at bedtime.  08/12/11   Historical Provider, MD    Patient Active Problem List   Diagnosis Date Noted  . Hyperglycemia 08/27/2015  . Chronic pain 08/21/2015  . Peripheral vascular disease ([place]) 08/14/2015  . Lumbar central spinal stenosis (severe at L4-5) 08/14/2015  . Lumbar facet hypertrophy (multilevel) 08/14/2015  . Grade 1 Anterolisthesis of L3 over L4 08/14/2015  . Lumbar foraminal stenosis (Right L2-3; Bilateral L3-4, L4-5) 08/14/2015  . Chronic low back pain (Location of Primary Source of Pain) (Bilateral) (R>L) 08/13/2015  . Lumbar spondylosis 08/13/2015  . Lumbar facet syndrome (Bilateral) (R>L) 08/13/2015  . Bradycardia 05/03/2015  . Coronary artery disease involving native coronary artery of native heart with unstable angina pectoris ([place]) 01/08/2015  . Absolute anemia 12/08/2014  . Benign fibroma of prostate 12/08/2014  . Atherosclerosis of coronary artery 12/08/2014  . Cataract 12/08/2014  . Esophageal stenosis 12/08/2014  . Acid reflux 12/08/2014  . Glaucoma 12/08/2014  . Hemorrhoid 12/08/2014  . HLD (hyperlipidemia) 12/08/2014  . Calculus of kidney 12/08/2014  . Obstructive apnea 12/08/2014  . Borderline diabetes 12/08/2014  . Peripheral blood vessel disorder ([place]) 12/08/2014  . Esophagogastric ring 12/08/2014  . Heart valve disease 12/08/2014  . MI (mitral incompetence) 10/03/2014  . Cardiomyopathy, ischemic 10/03/2014  . History of colonic polyps 10/02/2014  . Benign essential HTN 09/20/2014    Past Medical History  Diagnosis Date  . Myocardial infarction ([place]) 2011  . Hypertension   . Arthritis   . GERD (gastroesophageal reflux disease)     . Allergy     Social History   Social History  . Marital Status: Married    Spouse Name: N/A  . Number of Children: N/A  . Years of Education: N/A   Occupational History  . Not on file.   Social History Main Topics  . Smoking status: Former Smoker -- 15 years    Types: Cigarettes  . Smokeless tobacco: Never Used  . Alcohol Use: Yes     Comment: 1-2 glasses of wine a night  . Drug Use: No  . Sexual Activity: No   Other Topics Concern  . Not on file   Social History Narrative    Allergies  Allergen Reactions  . Penicillins Rash    Review of Systems  Constitutional: Negative.   HENT: Negative.   Eyes: Negative.   Respiratory: Positive for shortness of breath.   Cardiovascular: Negative.   Gastrointestinal: Negative.   Genitourinary: Negative.   Musculoskeletal: Positive for back pain.  Skin: Negative.   Neurological: Negative.   Endo/Heme/Allergies: Negative.   Psychiatric/Behavioral: Negative.     Immunization History  Administered Date(s) Administered  . Influenza-Unspecified 03/03/2015  . Pneumococcal Conjugate-13 12/14/2013  . Pneumococcal Polysaccharide-23 04/04/1998, 03/25/2006  .  Td 05/01/2008  . Tdap 02/25/2011  . Zoster 10/07/2011   Objective:  BP 138/52 mmHg  Pulse 62  Temp(Src) 98.4 F (36.9 C) (Oral)  Resp 14  Wt 174 lb (78.926 kg)  Physical Exam  Constitutional: He is oriented to person, place, and time and well-developed, well-nourished, and in no distress.  Eyes: Conjunctivae and EOM are normal. Pupils are equal, round, and reactive to light.  Neck: Normal range of motion. Neck supple.  Cardiovascular: Normal rate, regular rhythm, normal heart sounds and intact distal pulses.   Pulmonary/Chest: Effort normal and breath sounds normal.  Musculoskeletal: Normal range of motion.  Neurological: He is alert and oriented to person, place, and time. He has normal reflexes. Gait normal. GCS score is 15.  Skin: Skin is warm and dry.   Psychiatric: Mood, affect and judgment normal.    Lab Results  Component Value Date   WBC 5.5 01/08/2015   HGB 14.6 05/09/2013   HCT 40.2 01/08/2015   PLT 155 01/08/2015   GLUCOSE 115* 01/08/2015   CHOL 170 01/08/2015   TRIG 136 01/08/2015   HDL 47 01/08/2015   LDLCALC 96 01/08/2015   TSH 3.280 01/08/2015   HGBA1C 5.6 04/30/2015    CMP     Component Value Date/Time   NA 138 01/08/2015 0849   NA 137 05/09/2013 0912   K 4.9 01/08/2015 0849   K 4.4 05/09/2013 0912   CL 98 01/08/2015 0849   CL 103 05/09/2013 0912   CO2 24 01/08/2015 0849   CO2 29 05/09/2013 0912   GLUCOSE 115* 01/08/2015 0849   GLUCOSE 93 05/09/2013 0912   BUN 20 01/08/2015 0849   BUN 26* 05/09/2013 0912   CREATININE 1.66* 01/08/2015 0849   CREATININE 1.5* 12/14/2013   CREATININE 1.50* 05/09/2013 0912   CALCIUM 9.1 01/08/2015 0849   CALCIUM 9.2 05/09/2013 0912   PROT 7.1 01/08/2015 0849   ALBUMIN 4.6 01/08/2015 0849   AST 17 01/08/2015 0849   ALT 20 01/08/2015 0849   ALKPHOS 70 01/08/2015 0849   BILITOT 0.8 01/08/2015 0849   GFRNONAA 37* 01/08/2015 0849   GFRNONAA 43* 05/09/2013 0912   GFRAA 43* 01/08/2015 0849   GFRAA 50* 05/09/2013 0912    Assessment and Plan :  1. Benign essential HTN Stable.   2. Coronary artery disease involving native coronary artery of native heart with unstable angina pectoris ([place]) All risk factors treated.  3. Chronic low back pain (Location of Primary Source of Pain) (Bilateral) (R>L)   4. Hyperglycemia  - POCT HgB A1C- 6.2 follow.  5. HLD (hyperlipidemia)   6. Facial skin lesion Possible squamous cell versus basal cell. - Ambulatory referral to Dermatology  I have done the exam and reviewed the above chart and it is accurate to the best of my knowledge.  Patient was seen and examined by Dr. Miguel Aschoff, and noted scribed by Webb Laws, [place] MD [facility] 08/27/2015 9:30 AM

## 2015-08-28 DIAGNOSIS — L821 Other seborrheic keratosis: Secondary | ICD-10-CM | POA: Diagnosis not present

## 2015-08-28 DIAGNOSIS — D485 Neoplasm of uncertain behavior of skin: Secondary | ICD-10-CM | POA: Diagnosis not present

## 2015-08-28 DIAGNOSIS — D2339 Other benign neoplasm of skin of other parts of face: Secondary | ICD-10-CM | POA: Diagnosis not present

## 2015-09-06 ENCOUNTER — Encounter: Payer: Self-pay | Admitting: Pain Medicine

## 2015-09-06 ENCOUNTER — Ambulatory Visit: Payer: Medicare Other | Attending: Pain Medicine | Admitting: Pain Medicine

## 2015-09-06 VITALS — BP 139/59 | HR 66 | Temp 98.2°F | Resp 16 | Ht 67.0 in | Wt 170.0 lb

## 2015-09-06 DIAGNOSIS — I252 Old myocardial infarction: Secondary | ICD-10-CM | POA: Insufficient documentation

## 2015-09-06 DIAGNOSIS — M47816 Spondylosis without myelopathy or radiculopathy, lumbar region: Secondary | ICD-10-CM | POA: Diagnosis not present

## 2015-09-06 DIAGNOSIS — M4806 Spinal stenosis, lumbar region: Secondary | ICD-10-CM | POA: Diagnosis not present

## 2015-09-06 DIAGNOSIS — H409 Unspecified glaucoma: Secondary | ICD-10-CM | POA: Diagnosis not present

## 2015-09-06 DIAGNOSIS — M4316 Spondylolisthesis, lumbar region: Secondary | ICD-10-CM | POA: Insufficient documentation

## 2015-09-06 DIAGNOSIS — I10 Essential (primary) hypertension: Secondary | ICD-10-CM | POA: Insufficient documentation

## 2015-09-06 DIAGNOSIS — I255 Ischemic cardiomyopathy: Secondary | ICD-10-CM | POA: Diagnosis not present

## 2015-09-06 DIAGNOSIS — I25110 Atherosclerotic heart disease of native coronary artery with unstable angina pectoris: Secondary | ICD-10-CM | POA: Diagnosis not present

## 2015-09-06 DIAGNOSIS — R001 Bradycardia, unspecified: Secondary | ICD-10-CM | POA: Insufficient documentation

## 2015-09-06 DIAGNOSIS — H269 Unspecified cataract: Secondary | ICD-10-CM | POA: Insufficient documentation

## 2015-09-06 DIAGNOSIS — Z86010 Personal history of colonic polyps: Secondary | ICD-10-CM | POA: Diagnosis not present

## 2015-09-06 DIAGNOSIS — K649 Unspecified hemorrhoids: Secondary | ICD-10-CM | POA: Diagnosis not present

## 2015-09-06 DIAGNOSIS — G8929 Other chronic pain: Secondary | ICD-10-CM | POA: Insufficient documentation

## 2015-09-06 DIAGNOSIS — G4733 Obstructive sleep apnea (adult) (pediatric): Secondary | ICD-10-CM | POA: Insufficient documentation

## 2015-09-06 DIAGNOSIS — I739 Peripheral vascular disease, unspecified: Secondary | ICD-10-CM | POA: Insufficient documentation

## 2015-09-06 DIAGNOSIS — E785 Hyperlipidemia, unspecified: Secondary | ICD-10-CM | POA: Diagnosis not present

## 2015-09-06 DIAGNOSIS — K219 Gastro-esophageal reflux disease without esophagitis: Secondary | ICD-10-CM | POA: Insufficient documentation

## 2015-09-06 DIAGNOSIS — Z87891 Personal history of nicotine dependence: Secondary | ICD-10-CM | POA: Diagnosis not present

## 2015-09-06 DIAGNOSIS — M545 Low back pain: Secondary | ICD-10-CM | POA: Diagnosis not present

## 2015-09-06 DIAGNOSIS — D291 Benign neoplasm of prostate: Secondary | ICD-10-CM | POA: Insufficient documentation

## 2015-09-06 DIAGNOSIS — K222 Esophageal obstruction: Secondary | ICD-10-CM | POA: Diagnosis not present

## 2015-09-06 DIAGNOSIS — M48061 Spinal stenosis, lumbar region without neurogenic claudication: Secondary | ICD-10-CM

## 2015-09-06 DIAGNOSIS — M549 Dorsalgia, unspecified: Secondary | ICD-10-CM | POA: Diagnosis present

## 2015-09-06 DIAGNOSIS — Z87442 Personal history of urinary calculi: Secondary | ICD-10-CM | POA: Insufficient documentation

## 2015-09-06 DIAGNOSIS — Z951 Presence of aortocoronary bypass graft: Secondary | ICD-10-CM | POA: Insufficient documentation

## 2015-09-06 DIAGNOSIS — R7303 Prediabetes: Secondary | ICD-10-CM | POA: Diagnosis not present

## 2015-09-06 DIAGNOSIS — D649 Anemia, unspecified: Secondary | ICD-10-CM | POA: Insufficient documentation

## 2015-09-06 NOTE — Progress Notes (Signed)
Patient's Name: Austin [name] Patient type: Established  MRN: [id number] Service setting: Ambulatory outpatient  DOB: 1929-04-01   DOS: 09/06/2015    Primary Reason(s) for Visit: Encounter for post-procedure evaluation of chronic illness with mild to moderate exacerbation CC: Back Pain   HPI  Austin [name] is a 82 y.o. year old, male patient, who returns today as an established patient. He has History of colonic polyps; Absolute anemia; Benign essential HTN; Benign fibroma of prostate; Atherosclerosis of coronary artery; Cataract; Esophageal stenosis; Acid reflux; Glaucoma; Hemorrhoid; HLD (hyperlipidemia); Calculus of kidney; Obstructive apnea; Borderline diabetes; Peripheral blood vessel disorder ([place]); Esophagogastric ring; Heart valve disease; MI (mitral incompetence); Cardiomyopathy, ischemic; Coronary artery disease involving native coronary artery of native heart with unstable angina pectoris ([place]); Bradycardia; Chronic low back pain (Location of Primary Source of Pain) (Bilateral) (R>L); Lumbar spondylosis; Lumbar facet syndrome (Bilateral) (R>L); Peripheral vascular disease ([place]); Lumbar central spinal stenosis (severe at L4-5); Lumbar facet hypertrophy (multilevel); Grade 1 Anterolisthesis of L3 over L4; Lumbar foraminal stenosis (Right L2-3; Bilateral L3-4, L4-5); Chronic pain; and Hyperglycemia on his problem list.. His primarily concern today is the Back Pain   Pain Assessment: Self-Reported Pain Score: 0-No pain Reported level is compatible with observation Pain Type: Chronic pain Pain Location: Back Pain Orientation: Lower Pain Descriptors / Indicators: Aching, Constant Pain Frequency: Constant  The patient returns to the clinics today after having had a diagnostic bilateral lumbar facet block under fluoroscopic guidance that provided him with 100% relief of the pain. This procedure was done on 08/21/2015.  Date of Last Visit: 08/21/15 Service Provided on Last Visit: Procedure  (bilateral lumbar facet)  Post-Procedure Assessment  Procedure done on last visit: Diagnostic, bilateral, lumbar facet block under fluoroscopic guidance and IV sedation on 08/21/2015. Side-effects or Adverse reactions: None reported Sedation: Sedation given  Results: Ultra-Short Term Relief (First 1 hour after procedure): 90 %  Analgesia during this period is likely to be Local Anesthetic and/or IV Sedative (Analgesic/Anxiolitic) related Short Term Relief (Initial 4-6 hrs after procedure): 80 % Complete relief confirms area to be the source of pain Long Term Relief : 80 % Long-term benefit would suggest an inflammatory etiology to the pain   Current Relief (Now): 100%  Persistent relief would suggest effective anti-inflammatory effects from steroids Interpretation of Results: Clearly this type of procedure would be very effective in the palliative management of his low back pain. We'll repeat as necessary.  Laboratory Chemistry  Inflammation Markers No results found for: ESRSEDRATE, CRP  Renal Function Lab Results  Component Value Date   BUN 20 01/08/2015   CREATININE 1.66* 01/08/2015   GFRAA 43* 01/08/2015   GFRNONAA 37* 01/08/2015    Hepatic Function Lab Results  Component Value Date   AST 17 01/08/2015   ALT 20 01/08/2015   ALBUMIN 4.6 01/08/2015    Electrolytes Lab Results  Component Value Date   NA 138 01/08/2015   K 4.9 01/08/2015   CL 98 01/08/2015   CALCIUM 9.1 01/08/2015    Pain Modulating Vitamins No results found for: [place], VD125OH2TOT, [id number], [id number], VITAMINB12  Coagulation Parameters No results found for: INR, LABPROT  Note: I personally reviewed the above data. Results shared with patient.  Meds  The patient has a current medication list which includes the following prescription(s): amlodipine, aspirin, atorvastatin, azithromycin, biotin, chlorhexidine, vitamin d3, coenzyme q10, cyanocobalamin, diflunisal, dorzolamide-timolol,  glucosamine-chondroit-vit c-mn, isosorbide mononitrate, nitroglycerin, omega-3 fatty acids, omeprazole, omeprazole, tamsulosin, timolol, travoprost (bak free), and valsartan-hydrochlorothiazide.  Current Outpatient Prescriptions  on File Prior to Visit  Medication Sig  . amLODipine (NORVASC) 5 MG tablet Take 5 mg by mouth daily.  Marland Kitchen aspirin 81 MG tablet Take by mouth daily.   Marland Kitchen atorvastatin (LIPITOR) 20 MG tablet Take by mouth daily.   Marland Kitchen azithromycin (ZITHROMAX) 250 MG tablet Reported on 08/21/2015  . Biotin 5000 MCG CAPS Take by mouth daily.   . chlorhexidine (PERIDEX) 0.12 % solution Reported on 08/21/2015  . Cholecalciferol (VITAMIN D3) 2000 UNITS capsule Take 2,000 Units by mouth 2 (two) times daily.   Marland Kitchen COENZYME Q-10 PO Take 200 mg by mouth daily.   . Cyanocobalamin 100 MCG LOZG Take 1,000 mcg by mouth daily.   . diflunisal (DOLOBID) 500 MG TABS tablet Reported on 08/21/2015  . dorzolamide-timolol (COSOPT) 22.3-6.8 MG/ML ophthalmic solution   . GLUCOSAMINE-CHONDROIT-VIT C-MN PO Take 2 tablets by mouth daily.   . isosorbide mononitrate (IMDUR) 30 MG 24 hr tablet Take by mouth.  . nitroGLYCERIN (NITROSTAT) 0.4 MG SL tablet Place under the tongue every 5 (five) minutes as needed.   . OMEGA-3 FATTY ACIDS PO Take 1,200 mcg by mouth 2 (two) times daily.   Marland Kitchen omeprazole (PRILOSEC) 20 MG capsule Reported on 08/21/2015  . Omeprazole 20 MG TBEC Take by mouth daily. Reported on 08/21/2015  . tamsulosin (FLOMAX) 0.4 MG CAPS capsule TAKE 1 CAPSULE BY MOUTH EVERY DAY  . timolol (BETIMOL) 0.5 % ophthalmic solution Place 1 drop into both eyes 2 (two) times daily.  . Travoprost, BAK Free, (TRAVATAN Z) 0.004 % SOLN ophthalmic solution Place 1 drop into both eyes at bedtime.   . valsartan-hydrochlorothiazide (DIOVAN HCT) 320-25 MG per tablet Take 1 tablet by mouth daily.    No current facility-administered medications on file prior to visit.    ROS  Constitutional: Afebrile, no chills, well hydrated and well  nourished Gastrointestinal: No upper or lower GI bleeding, no nausea, no vomiting and no acute GI distress Musculoskeletal: No acute joint swelling or redness, no acute loss of range of motion and no acute onset weakness Neurological: Denies any acute onset apraxia, no episodes of paralysis, no acute loss of coordination, no acute loss of consciousness and no acute onset aphasia, dysarthria, agnosia, or amnesia  Allergies  Mr. Fulmer is allergic to penicillins.  [place]  Medical:  Mr. Kocinski  has a past medical history of Myocardial infarction [facility]) (2011); Hypertension; Arthritis; GERD (gastroesophageal reflux disease); and Allergy. Family: family history includes Heart disease in his brother, brother, brother, brother, father, and mother. Surgical:  has past surgical history that includes Shoulder surgery (Right); Coronary artery bypass graft (2007); Cardiac catheterization (2007); Colonoscopy (N/A, 10/02/2014); Tonsillectomy; Hemorroidectomy; Carpal tunnel release (Bilateral); Cardiac catheterization (N/A, 04/17/2015); and Lumbar epidural injection (2011). Tobacco:  reports that he has quit smoking. His smoking use included Cigarettes. He quit after 15 years of use. He has never used smokeless tobacco. Alcohol:  reports that he drinks alcohol. Drug:  reports that he does not use illicit drugs.  Physical Examination  Constitutional Vitals:  Today's Vitals   09/06/15 1320 09/06/15 1321  BP: 139/59   Pulse: 66   Temp: 98.2 F (36.8 C)   TempSrc: Oral   Resp: 16   Height: 5\' 7"  (1.702 m)   Weight: 170 lb (77.111 kg)   SpO2: 99%   PainSc: 0-No pain 0-No pain  PainLoc: Back     Calculated BMI: Body mass index is 26.62 kg/(m^2). Overweight (25-29.9 kg/m2) - 20% higher incidence of chronic pain General appearance:  alert, cooperative, appears stated age and no distress Eyes: PERLA Respiratory: No evidence respiratory distress, no audible rales or ronchi and no use of accessory muscles of  respiration  Cervical Spine Exam  Inspection: Normal anatomy, no anomalies observed Cervical Lordosis: Normal Alignment: Symetrical Functional ROM: Within functional limits (WFL) AROM: WFL Sensory: No sensory abnormalities reported  Upper Extremity Exam   Right  Left  Inspection: No gross anomalies detected Functional ROM: Within functional limits [facility])  Inspection: No gross anomalies detected Functional ROM: Within functional limits (WFL)  AROM: Adequate  AROM: Adequate  Sensory: Normal  Sensory: Normal  Motor: Unremarkable       Motor: Unremarkable        Thoracic Spine  Inspection: No gross anomalies detected Alignment: Symetrical AROM: Adequate  Lumbar Spne  Inspection: No gross anomalies detected Lumbar Lordosis: Normal Sacral Kyphosis: Normal Alignment: Symetrical Palpation: WNL AROM: Adequate Provocative Tests:  Lumbar Hyperextension and rotation test:  deferred Patrick's Maneuver: deferred  Gait Assessment  Gait: WNL  Lower Extremities   Right  Left  Inspection: No gross anomalies detected Functional ROM: Within functional limits [facility])  Inspection: No gross anomalies detected Functional ROM: Within functional limits (WFL)  AROM: Adequate  AROM: Adequate  Sensory:  Normal  Sensory:  Normal  Motor: Unremarkable       Motor: Unremarkable        Assessment & Plan  Primary Diagnosis & Pertinent Problem List: The primary encounter diagnosis was Chronic low back pain (Location of Primary Source of Pain) (Bilateral) (R>L). Diagnoses of Lumbar spondylosis, unspecified spinal osteoarthritis, Lumbar central spinal stenosis (severe at L4-5), and Lumbar facet syndrome (Bilateral) (R>L) were also pertinent to this visit.  Visit Diagnosis: 1. Chronic low back pain (Location of Primary Source of Pain) (Bilateral) (R>L)   2. Lumbar spondylosis, unspecified spinal osteoarthritis   3. Lumbar central spinal stenosis (severe at L4-5)   4. Lumbar facet syndrome (Bilateral)  (R>L)     Problem-specific Plan(s): No problem-specific assessment & plan notes found for this encounter.   Plan of Care  Pharmacotherapy (Medications Ordered): No orders of the defined types were placed in this encounter.   Lab-work & Procedure Ordered: Orders Placed This Encounter  Procedures  . LUMBAR EPIDURAL STEROID INJECTION    Standing Status: Standing     Number of Occurrences: 1     Standing Expiration Date: 09/05/2016    Scheduling Instructions:     Side: Midline     Sedation: No Sedation.     Timeframe: PRN Procedure. Patient will call to schedule.    Order Specific Question:  Where will this procedure be performed?    Answer:  [facility]  . LUMBAR FACET(MEDIAL BRANCH NERVE BLOCK) MBNB    Standing Status: Standing     Number of Occurrences: 1     Standing Expiration Date: 09/05/2016    Scheduling Instructions:     Side: Bilateral     Level: L2, L3, L4, L5, & S1 Medial Branch Nerve     Sedation: With Sedation.     Timeframe: PRN Procedure. Patient will call to schedule.    Order Specific Question:  Where will this procedure be performed?    Answer:  [facility]    Imaging Ordered: None  Interventional Therapies: Scheduled:  None at this time    Considering:  Repeat lumbar epidural steroid injection versus right L4-5 lumbar epidural steroid injection.    PRN Procedures:   1. For the low back pain,  bilateral lumbar facet block under fluoroscopic guidance and IV sedation.  2. For the lower extremity pain, right L4-5 lumbar epidural steroid injection under fluoroscopic guidance, without sedation.    Referral(s) or Consult(s): None at this time.  Medications administered during this visit: Mr. Haake had no medications administered during this visit.  Future Appointments Date Time Provider [place]  02/27/2016 9:00 AM Richard Maceo Pro., MD [facility] None    Primary Care Physician: Wilhemena Durie, MD Location: [facility]  Outpatient Pain Management Facility Note by: Kathlen Brunswick. Dossie Arbour, M.D, DABA, DABAPM, DABPM, DABIPP, FIPP  Pain Score Disclaimer: We use the NRS-11 scale. This is a self-reported, subjective measurement of pain severity with only modest accuracy. It is used primarily to identify changes within a particular patient. It must be understood that outpatient pain scales are significantly less accurate that those used for research, where they can be applied under ideal controlled circumstances with minimal exposure to variables. In reality, the score is likely to be a combination of pain intensity and pain affect, where pain affect describes the degree of emotional arousal or changes in action readiness caused by the sensory experience of pain. Factors such as social and work situation, setting, emotional state, anxiety levels, expectation, and prior pain experience may influence pain perception and show large inter-individual differences that may also be affected by time variables.

## 2015-09-06 NOTE — Progress Notes (Signed)
Safety precautions to be maintained throughout the outpatient stay will include: orient to surroundings, keep bed in low position, maintain call bell within reach at all times, provide assistance with transfer out of bed and ambulation. Patient did not bring any meds today.

## 2015-10-02 ENCOUNTER — Ambulatory Visit: Payer: Medicare Other | Attending: Pain Medicine | Admitting: Pain Medicine

## 2015-10-02 ENCOUNTER — Encounter: Payer: Self-pay | Admitting: Pain Medicine

## 2015-10-02 VITALS — BP 111/52 | HR 57 | Temp 96.8°F | Resp 14 | Ht 67.0 in | Wt 170.0 lb

## 2015-10-02 DIAGNOSIS — I25110 Atherosclerotic heart disease of native coronary artery with unstable angina pectoris: Secondary | ICD-10-CM | POA: Diagnosis not present

## 2015-10-02 DIAGNOSIS — I340 Nonrheumatic mitral (valve) insufficiency: Secondary | ICD-10-CM | POA: Insufficient documentation

## 2015-10-02 DIAGNOSIS — G8929 Other chronic pain: Secondary | ICD-10-CM | POA: Diagnosis not present

## 2015-10-02 DIAGNOSIS — R7303 Prediabetes: Secondary | ICD-10-CM | POA: Insufficient documentation

## 2015-10-02 DIAGNOSIS — D649 Anemia, unspecified: Secondary | ICD-10-CM | POA: Insufficient documentation

## 2015-10-02 DIAGNOSIS — M4316 Spondylolisthesis, lumbar region: Secondary | ICD-10-CM | POA: Diagnosis not present

## 2015-10-02 DIAGNOSIS — M545 Low back pain: Secondary | ICD-10-CM

## 2015-10-02 DIAGNOSIS — K222 Esophageal obstruction: Secondary | ICD-10-CM | POA: Diagnosis not present

## 2015-10-02 DIAGNOSIS — H409 Unspecified glaucoma: Secondary | ICD-10-CM | POA: Diagnosis not present

## 2015-10-02 DIAGNOSIS — D291 Benign neoplasm of prostate: Secondary | ICD-10-CM | POA: Insufficient documentation

## 2015-10-02 DIAGNOSIS — K219 Gastro-esophageal reflux disease without esophagitis: Secondary | ICD-10-CM | POA: Diagnosis not present

## 2015-10-02 DIAGNOSIS — M47816 Spondylosis without myelopathy or radiculopathy, lumbar region: Secondary | ICD-10-CM | POA: Diagnosis not present

## 2015-10-02 DIAGNOSIS — I10 Essential (primary) hypertension: Secondary | ICD-10-CM | POA: Diagnosis not present

## 2015-10-02 DIAGNOSIS — Z86010 Personal history of colonic polyps: Secondary | ICD-10-CM | POA: Insufficient documentation

## 2015-10-02 DIAGNOSIS — I255 Ischemic cardiomyopathy: Secondary | ICD-10-CM | POA: Diagnosis not present

## 2015-10-02 DIAGNOSIS — Z87442 Personal history of urinary calculi: Secondary | ICD-10-CM | POA: Insufficient documentation

## 2015-10-02 DIAGNOSIS — H269 Unspecified cataract: Secondary | ICD-10-CM | POA: Diagnosis not present

## 2015-10-02 DIAGNOSIS — I739 Peripheral vascular disease, unspecified: Secondary | ICD-10-CM | POA: Insufficient documentation

## 2015-10-02 DIAGNOSIS — E785 Hyperlipidemia, unspecified: Secondary | ICD-10-CM | POA: Insufficient documentation

## 2015-10-02 DIAGNOSIS — M4806 Spinal stenosis, lumbar region: Secondary | ICD-10-CM | POA: Diagnosis not present

## 2015-10-02 DIAGNOSIS — G4733 Obstructive sleep apnea (adult) (pediatric): Secondary | ICD-10-CM | POA: Insufficient documentation

## 2015-10-02 DIAGNOSIS — M549 Dorsalgia, unspecified: Secondary | ICD-10-CM | POA: Diagnosis present

## 2015-10-02 MED ORDER — LIDOCAINE HCL (PF) 1 % IJ SOLN: 10.0000 mL | Freq: Once | INTRAMUSCULAR | Status: DC

## 2015-10-02 MED ORDER — TRIAMCINOLONE ACETONIDE 40 MG/ML IJ SUSP
INTRAMUSCULAR | Status: AC
Start: 2015-10-02 — End: 2015-10-02
  Administered 2015-10-02: 09:00:00
  Filled 2015-10-02: qty 2

## 2015-10-02 MED ORDER — FENTANYL CITRATE (PF) 100 MCG/2ML IJ SOLN
INTRAMUSCULAR | Status: AC
  Administered 2015-10-02: 25 ug via INTRAVENOUS
  Filled 2015-10-02: qty 2

## 2015-10-02 MED ORDER — ROPIVACAINE HCL 2 MG/ML IJ SOLN: 9.0000 mL | Freq: Once | INTRAMUSCULAR | Status: DC

## 2015-10-02 MED ORDER — MIDAZOLAM HCL 5 MG/5ML IJ SOLN: 1.0000 mg | INTRAMUSCULAR | Status: DC | PRN

## 2015-10-02 MED ORDER — TRIAMCINOLONE ACETONIDE 40 MG/ML IJ SUSP: 40.0000 mg | Freq: Once | INTRAMUSCULAR | Status: DC

## 2015-10-02 MED ORDER — LACTATED RINGERS IV SOLN: 1000.0000 mL | Freq: Once | INTRAVENOUS | Status: DC

## 2015-10-02 MED ORDER — FENTANYL CITRATE (PF) 100 MCG/2ML IJ SOLN: 25.0000 ug | INTRAMUSCULAR | Status: DC | PRN

## 2015-10-02 MED ORDER — MIDAZOLAM HCL 5 MG/5ML IJ SOLN
INTRAMUSCULAR | Status: AC
  Administered 2015-10-02: 0.5 mg via INTRAVENOUS
  Filled 2015-10-02: qty 5

## 2015-10-02 MED ORDER — ROPIVACAINE HCL 2 MG/ML IJ SOLN
INTRAMUSCULAR | Status: AC
  Administered 2015-10-02: 09:00:00
  Filled 2015-10-02: qty 20

## 2015-10-02 NOTE — Progress Notes (Signed)
Safety precautions to be maintained throughout the outpatient stay will include: orient to surroundings, keep bed in low position, maintain call bell within reach at all times, provide assistance with transfer out of bed and ambulation.  

## 2015-10-02 NOTE — Patient Instructions (Signed)
Pain Management Discharge Instructions  General Discharge Instructions :  If you need to reach your doctor call: Monday-Friday 8:00 am - 4:00 pm at [phone] or toll free [phone].  After clinic hours [phone] to have operator reach doctor.  Bring all of your medication bottles to all your appointments in the pain clinic.  To cancel or reschedule your appointment with Pain Management please remember to call 24 hours in advance to avoid a fee.  Refer to the educational materials which you have been given on: General Risks, I had my Procedure. Discharge Instructions, Post Sedation.  Post Procedure Instructions:  The drugs you were given will stay in your system until tomorrow, so for the next 24 hours you should not drive, make any legal decisions or drink any alcoholic beverages.  You may eat anything you prefer, but it is better to start with liquids then soups and crackers, and gradually work up to solid foods.  Please notify your doctor immediately if you have any unusual bleeding, trouble breathing or pain that is not related to your normal pain.  Depending on the type of procedure that was done, some parts of your body may feel week and/or numb.  This usually clears up by tonight or the next day.  Walk with the use of an assistive device or accompanied by an adult for the 24 hours.  You may use ice on the affected area for the first 24 hours.  Put ice in a Ziploc bag and cover with a towel and place against area 15 minutes on 15 minutes off.  You may switch to heat after 24 hours.GENERAL RISKS AND COMPLICATIONS  What are the risk, side effects and possible complications? Generally speaking, most procedures are safe.  However, with any procedure there are risks, side effects, and the possibility of complications.  The risks and complications are dependent upon the sites that are lesioned, or the type of nerve block to be performed.  The closer the procedure is to the spine,  the more serious the risks are.  Great care is taken when placing the radio frequency needles, block needles or lesioning probes, but sometimes complications can occur. 1. Infection: Any time there is an injection through the skin, there is a risk of infection.  This is why sterile conditions are used for these blocks.  There are four possible types of infection. 1. Localized skin infection. 2. Central Nervous System Infection-This can be in the form of Meningitis, which can be deadly. 3. Epidural Infections-This can be in the form of an epidural abscess, which can cause pressure inside of the spine, causing compression of the spinal cord with subsequent paralysis. This would require an emergency surgery to decompress, and there are no guarantees that the patient would recover from the paralysis. 4. Discitis-This is an infection of the intervertebral discs.  It occurs in about 1% of discography procedures.  It is difficult to treat and it may lead to surgery.        2. Pain: the needles have to go through skin and soft tissues, will cause soreness.       3. Damage to internal structures:  The nerves to be lesioned may be near blood vessels or    other nerves which can be potentially damaged.       4. Bleeding: Bleeding is more common if the patient is taking blood thinners such as  aspirin, Coumadin, Ticiid, Plavix, etc., or if he/she have some genetic predisposition  such as   hemophilia. Bleeding into the spinal canal can cause compression of the spinal  cord with subsequent paralysis.  This would require an emergency surgery to  decompress and there are no guarantees that the patient would recover from the  paralysis.       5. Pneumothorax:  Puncturing of a lung is a possibility, every time a needle is introduced in  the area of the chest or upper back.  Pneumothorax refers to free air around the  collapsed lung(s), inside of the thoracic cavity (chest cavity).  Another two possible  complications  related to a similar event would include: Hemothorax and Chylothorax.   These are variations of the Pneumothorax, where instead of air around the collapsed  lung(s), you may have blood or chyle, respectively.       6. Spinal headaches: They may occur with any procedures in the area of the spine.       7. Persistent CSF (Cerebro-Spinal Fluid) leakage: This is a rare problem, but may occur  with prolonged intrathecal or epidural catheters either due to the formation of a fistulous  track or a dural tear.       8. Nerve damage: By working so close to the spinal cord, there is always a possibility of  nerve damage, which could be as serious as a permanent spinal cord injury with  paralysis.       9. Death:  Although rare, severe deadly allergic reactions known as "Anaphylactic  reaction" can occur to any of the medications used.      10. Worsening of the symptoms:  We can always make thing worse.  What are the chances of something like this happening? Chances of any of this occuring are extremely low.  By statistics, you have more of a chance of getting killed in a motor vehicle accident: while driving to the hospital than any of the above occurring .  Nevertheless, you should be aware that they are possibilities.  In general, it is similar to taking a shower.  Everybody knows that you can slip, hit your head and get killed.  Does that mean that you should not shower again?  Nevertheless always keep in mind that statistics do not mean anything if you happen to be on the wrong side of them.  Even if a procedure has a 1 (one) in a 1,000,000 (million) chance of going wrong, it you happen to be that one..Also, keep in mind that by statistics, you have more of a chance of having something go wrong when taking medications.  Who should not have this procedure? If you are on a blood thinning medication (e.g. Coumadin, Plavix, see list of "Blood Thinners"), or if you have an active infection going on, you should not  have the procedure.  If you are taking any blood thinners, please inform your physician.  How should I prepare for this procedure?  Do not eat or drink anything at least six hours prior to the procedure.  Bring a driver with you .  It cannot be a taxi.  Come accompanied by an adult that can drive you back, and that is strong enough to help you if your legs get weak or numb from the local anesthetic.  Take all of your medicines the morning of the procedure with just enough water to swallow them.  If you have diabetes, make sure that you are scheduled to have your procedure done first thing in the morning, whenever possible.  If you have diabetes,   take only half of your insulin dose and notify our nurse that you have done so as soon as you arrive at the clinic.  If you are diabetic, but only take blood sugar pills (oral hypoglycemic), then do not take them on the morning of your procedure.  You may take them after you have had the procedure.  Do not take aspirin or any aspirin-containing medications, at least eleven (11) days prior to the procedure.  They may prolong bleeding.  Wear loose fitting clothing that may be easy to take off and that you would not mind if it got stained with Betadine or blood.  Do not wear any jewelry or perfume  Remove any nail coloring.  It will interfere with some of our monitoring equipment.  NOTE: Remember that this is not meant to be interpreted as a complete list of all possible complications.  Unforeseen problems may occur.  BLOOD THINNERS The following drugs contain aspirin or other products, which can cause increased bleeding during surgery and should not be taken for 2 weeks prior to and 1 week after surgery.  If you should need take something for relief of minor pain, you may take acetaminophen which is found in Tylenol,m Datril, Anacin-3 and Panadol. It is not blood thinner. The products listed below are.  Do not take any of the products listed below  in addition to any listed on your instruction sheet.  A.P.C or A.P.C with Codeine Codeine Phosphate Capsules #3 Ibuprofen Ridaura  ABC compound Congesprin Imuran rimadil  Advil Cope Indocin Robaxisal  Alka-Seltzer Effervescent Pain Reliever and Antacid Coricidin or Coricidin-D  Indomethacin Rufen  Alka-Seltzer plus Cold Medicine Cosprin Ketoprofen S-A-C Tablets  Anacin Analgesic Tablets or Capsules Coumadin Korlgesic Salflex  Anacin Extra Strength Analgesic tablets or capsules CP-2 Tablets Lanoril Salicylate  Anaprox Cuprimine Capsules Levenox Salocol  Anexsia-D Dalteparin Magan Salsalate  Anodynos Darvon compound Magnesium Salicylate Sine-off  Ansaid Dasin Capsules Magsal Sodium Salicylate  Anturane Depen Capsules Marnal Soma  APF Arthritis pain formula Dewitt's Pills Measurin Stanback  Argesic Dia-Gesic Meclofenamic Sulfinpyrazone  Arthritis Bayer Timed Release Aspirin Diclofenac Meclomen Sulindac  Arthritis pain formula Anacin Dicumarol Medipren Supac  Analgesic (Safety coated) Arthralgen Diffunasal Mefanamic Suprofen  Arthritis Strength Bufferin Dihydrocodeine Mepro Compound Suprol  Arthropan liquid Dopirydamole Methcarbomol with Aspirin Synalgos  ASA tablets/Enseals Disalcid Micrainin Tagament  Ascriptin Doan's Midol Talwin  Ascriptin A/D Dolene Mobidin Tanderil  Ascriptin Extra Strength Dolobid Moblgesic Ticlid  Ascriptin with Codeine Doloprin or Doloprin with Codeine Momentum Tolectin  Asperbuf Duoprin Mono-gesic Trendar  Aspergum Duradyne Motrin or Motrin IB Triminicin  Aspirin plain, buffered or enteric coated Durasal Myochrisine Trigesic  Aspirin Suppositories Easprin Nalfon Trillsate  Aspirin with Codeine Ecotrin Regular or Extra Strength Naprosyn Uracel  Atromid-S Efficin Naproxen Ursinus  Auranofin Capsules Elmiron Neocylate Vanquish  Axotal Emagrin Norgesic Verin  Azathioprine Empirin or Empirin with Codeine Normiflo Vitamin E  Azolid Emprazil Nuprin Voltaren  Bayer  Aspirin plain, buffered or children's or timed BC Tablets or powders Encaprin Orgaran Warfarin Sodium  Buff-a-Comp Enoxaparin Orudis Zorpin  Buff-a-Comp with Codeine Equegesic Os-Cal-Gesic   Buffaprin Excedrin plain, buffered or Extra Strength Oxalid   Bufferin Arthritis Strength Feldene Oxphenbutazone   Bufferin plain or Extra Strength Feldene Capsules Oxycodone with Aspirin   Bufferin with Codeine Fenoprofen Fenoprofen Pabalate or Pabalate-SF   Buffets II Flogesic Panagesic   Buffinol plain or Extra Strength Florinal or Florinal with Codeine Panwarfarin   Buf-Tabs Flurbiprofen Penicillamine   Butalbital Compound Four-way cold tablets   Penicillin   Butazolidin Fragmin Pepto-Bismol   Carbenicillin Geminisyn Percodan   Carna Arthritis Reliever Geopen Persantine   Carprofen Gold's salt Persistin   Chloramphenicol Goody's Phenylbutazone   Chloromycetin Haltrain Piroxlcam   Clmetidine heparin Plaquenil   Cllnoril Hyco-pap Ponstel   Clofibrate Hydroxy chloroquine Propoxyphen         Before stopping any of these medications, be sure to consult the physician who ordered them.  Some, such as Coumadin (Warfarin) are ordered to prevent or treat serious conditions such as "deep thrombosis", "pumonary embolisms", and other heart problems.  The amount of time that you may need off of the medication may also vary with the medication and the reason for which you were taking it.  If you are taking any of these medications, please make sure you notify your pain physician before you undergo any procedures.         Facet Joint Block, Care After Refer to this sheet in the next few weeks. These instructions provide you with information on caring for yourself after your procedure. Your health care provider may also give you more specific instructions. Your treatment has been planned according to current medical practices, but problems sometimes occur. Call your health care provider if you have any problems  or questions after your procedure. HOME CARE INSTRUCTIONS  2. Keep track of the amount of pain relief you feel and how long it lasts. 3. Limit pain medicine within the first 4-6 hours after the procedure as directed by your health care provider. 4. Resume taking dietary supplements and medicines as directed by your health care provider. 5. You may resume your regular diet. 6. Do not apply heat near or over the injection site(s) for 24 hours.  7. Do not take a bath or soak in water (such as a pool or lake) for 24 hours. 8. Do not drive for 24 hours unless approved by your health care provider. 9. Avoid strenuous activity for 24 hours. 10. Remove your bandages the morning after the procedure.  11. If the injection site is tender, applying an ice pack may relieve some tenderness. To do this: 1. Put ice in a bag. 2. Place a towel between your skin and the bag. 3. Leave the ice on for 15-20 minutes, 3-4 times a day. 12. Keep follow-up appointments as directed by your health care provider. SEEK MEDICAL CARE IF:   Your pain is not controlled by your medicines.   There is drainage from the injection site.   There is significant bleeding or swelling at the injection site.  You have diabetes and your blood sugar is above 180 mg/dL. SEEK IMMEDIATE MEDICAL CARE IF:   You develop a fever of 101F (38.3C) or greater.   You have worsening pain or swelling around the injection site.   You have red streaking around the injection site.   You develop severe pain that is not controlled by your medicines.   You develop a headache, stiff neck, nausea, or vomiting.   Your eyes become very sensitive to light.   You have weakness, paralysis, or tingling in your arms or legs that was not present before the procedure.   You develop difficulty urinating or breathing.    This information is not intended to replace advice given to you by your health care provider. Make sure you discuss any  questions you have with your health care provider.   Document Released: 05/05/2012 Document Revised: 06/09/2014 Document Reviewed: 05/05/2012 Elsevier Interactive Patient Education 2016   [employer] Facet Joint Block The facet joints connect the bones of the spine (vertebrae). They make it possible for you to bend, twist, and make other movements with your spine. They also prevent you from overbending, overtwisting, and making other excessive movements.  A facet joint block is a procedure where a numbing medicine (anesthetic) is injected into a facet joint. Often, a type of anti-inflammatory medicine called a steroid is also injected. A facet joint block may be done for two reasons:  13. Diagnosis. A facet joint block may be done as a test to see whether neck or back pain is caused by a worn-down or infected facet joint. If the pain gets better after a facet joint block, it means the pain is probably coming from the facet joint. If the pain does not get better, it means the pain is probably not coming from the facet joint.  14. Therapy. A facet joint block may be done to relieve neck or back pain caused by a facet joint. A facet joint block is only done as a therapy if the pain does not improve with medicine, exercise programs, physical therapy, and other forms of pain management. LET YOUR HEALTH CARE PROVIDER KNOW ABOUT:   Any allergies you have.   All medicines you are taking, including vitamins, herbs, eyedrops, and over-the-counter medicines and creams.   Previous problems you or members of your family have had with the use of anesthetics.   Any blood disorders you have had.   Other health problems you have. RISKS AND COMPLICATIONS Generally, having a facet joint block is safe. However, as with any procedure, complications can occur. Possible complications associated with having a facet joint block include:   Bleeding.   Injury to a nerve near the injection site.   Pain at the  injection site.   Weakness or numbness in areas controlled by nerves near the injection site.   Infection.   Temporary fluid retention.   Allergic reaction to anesthetics or medicines used during the procedure. BEFORE THE PROCEDURE   Follow your health care provider's instructions if you are taking dietary supplements or medicines. You may need to stop taking them or reduce your dosage.   Do not take any new dietary supplements or medicines without asking your health care provider first.   Follow your health care provider's instructions about eating and drinking before the procedure. You may need to stop eating and drinking several hours before the procedure.   Arrange to have an adult drive you home after the procedure. PROCEDURE 12. You may need to remove your clothing and dress in an open-back gown so that your health care provider can access your spine.  13. The procedure will be done while you are lying on an X-ray table. Most of the time you will be asked to lie on your stomach, but you may be asked to lie in a different position if an injection will be made in your neck.  14. Special machines will be used to monitor your oxygen levels, heart rate, and blood pressure.  15. If an injection will be made in your neck, an intravenous (IV) tube will be inserted into one of your veins. Fluids and medicine will flow directly into your body through the IV tube.  16. The area over the facet joint where the injection will be made will be cleaned with an antiseptic soap. The surrounding skin will be covered with sterile drapes.  17. An anesthetic will be applied to   your skin to make the injection area numb. You may feel a temporary stinging or burning sensation.  18. A video X-ray machine will be used to locate the joint. A contrast dye may be injected into the facet joint area to help with locating the joint.  19. When the joint is located, an anesthetic medicine will be injected  into the joint through the needle.  20. Your health care provider will ask you whether you feel pain relief. If you do feel relief, a steroid may be injected to provide pain relief for a longer period of time. If you do not feel relief or feel only partial relief, additional injections of an anesthetic may be made in other facet joints.  21. The needle will be removed, the skin will be cleansed, and bandages will be applied.  AFTER THE PROCEDURE   You will be observed for 15-30 minutes before being allowed to go home. Do not drive. Have an adult drive you or take a taxi or public transportation instead.   If you feel pain relief, the pain will return in several hours or days when the anesthetic wears off.   You may feel pain relief 2-14 days after the procedure. The amount of time this relief lasts varies from person to person.   It is normal to feel some tenderness over the injected area(s) for 2 days following the procedure.   If you have diabetes, you may have a temporary increase in blood sugar.   This information is not intended to replace advice given to you by your health care provider. Make sure you discuss any questions you have with your health care provider.   Document Released: 10/08/2006 Document Revised: 06/09/2014 Document Reviewed: 03/08/2012 Elsevier Interactive Patient Education 2016 [employer]  

## 2015-10-02 NOTE — Progress Notes (Signed)
Patient's Name: Austin [name]  Patient type: Established  MRN: [id number]  Service setting: Ambulatory outpatient  DOB: 06/05/1928  Location: [facility]  DOS: 10/02/2015  Primary Care Physician: Wilhemena Durie, MD  Note by: Kathlen Brunswick. Dossie Arbour, M.D, DABA, Sarita Haver, DABPM, Milagros Evener, FIPP  Referring Physician: Jerrol Banana.,*  Specialty: Board-Certified Interventional Pain Management  Last Visit to Pain Management: 09/06/2015   Primary Reason(s) for Visit: Interventional Pain Management Treatment. CC: Back Pain  Primary Diagnosis: Facet syndrome, lumbar [M54.5]   Procedure:  Anesthesia, Analgesia, Anxiolysis:  Type: Diagnostic Medial Branch Facet Block Region: Lumbar Level: L2, L3, L4, L5, & S1 Medial Branch Level(s) Laterality: Bilateral  Indications: 1. Lumbar facet syndrome (Bilateral) (R>L)   2. Lumbar spondylosis, unspecified spinal osteoarthritis     Pre-procedure Pain Score: 1/10 Reported level of pain is compatible with clinical observations Post-procedure Pain Score: 0-No pain  Type: Moderate (Conscious) Sedation & Local Anesthesia Local Anesthetic: Lidocaine 1% Route: Intravenous (IV) IV Access: Secured Sedation: Meaningful verbal contact was maintained at all times during the procedure  Indication(s): Analgesia & Anxiolysis   Pre-Procedure Assessment:  Austin [name] is a 82 y.o. year old, male patient, seen today for interventional treatment. He has History of colonic polyps; Absolute anemia; Benign essential HTN; Benign fibroma of prostate; Atherosclerosis of coronary artery; Cataract; Esophageal stenosis; Acid reflux; Glaucoma; Hemorrhoid; HLD (hyperlipidemia); Calculus of kidney; Obstructive apnea; Borderline diabetes; Peripheral blood vessel disorder ([place]); Esophagogastric ring; Heart valve disease; MI (mitral incompetence); Cardiomyopathy, ischemic; Coronary artery disease involving native coronary artery of native heart with unstable  angina pectoris ([place]); Bradycardia; Chronic low back pain (Location of Primary Source of Pain) (Bilateral) (R>L); Lumbar spondylosis; Lumbar facet syndrome (Bilateral) (R>L); Peripheral vascular disease ([place]); Lumbar central spinal stenosis (severe at L4-5); Lumbar facet hypertrophy (multilevel); Grade 1 Anterolisthesis of L3 over L4; Lumbar foraminal stenosis (Right L2-3; Bilateral L3-4, L4-5); Chronic pain; and Hyperglycemia on his problem list.. His primarily concern today is the Back Pain   Pain Type: Chronic pain Pain Descriptors / Indicators: Sharp Pain Frequency: Intermittent  Date of Last Visit: 09/04/15 Service Provided on Last Visit: Evaluation  Verification of the correct person, correct site (including marking of site), and correct procedure were performed and confirmed by the patient.  Consent: Secured. Under the influence of no sedatives a written informed consent was obtained, after having provided information on the risks and possible complications. To fulfill our ethical and legal obligations, as recommended by the American Medical Association's Code of Ethics, we have provided information to the patient about our clinical impression; the nature and purpose of the treatment or procedure; the risks, benefits, and possible complications of the intervention; alternatives; the risk(s) and benefit(s) of the alternative treatment(s) or procedure(s); and the risk(s) and benefit(s) of doing nothing. The patient was provided information about the risks and possible complications associated with the procedure. These include, but are not limited to, failure to achieve desired goals, infection, bleeding, organ or nerve damage, allergic reactions, paralysis, and death. In the case of spinal procedures these may include, but are not limited to, failure to achieve desired goals, infection, bleeding, organ or nerve damage, allergic reactions, paralysis, and death. In addition, the patient was informed  that Medicine is not an exact science; therefore, there is also the possibility of unforeseen risks and possible complications that may result in a catastrophic outcome. The patient indicated having understood very clearly. We have given the patient no guarantees and we have made  no promises. Enough time was given to the patient to ask questions, all of which were answered to the patient's satisfaction.  Consent Attestation: I, the ordering provider, attest that I have discussed with the patient the benefits, risks, side-effects, alternatives, likelihood of achieving goals, and potential problems during recovery for the procedure that I have provided informed consent.  Pre-Procedure Preparation: Safety Precautions: Allergies reviewed. Appropriate site, procedure, and patient were confirmed by following the Joint Commission's Universal Protocol (UP.01.01.01), in the form of a "Time Out". The patient was asked to confirm marked site and procedure, before commencing. The patient was asked about blood thinners, or active infections, both of which were denied. Patient was assessed for positional comfort and all pressure points were checked before starting procedure. Allergies: He is allergic to penicillins.. Infection Control Precautions: Sterile technique used. Standard Universal Precautions were taken as recommended by the Department of [facility] for Disease Control and Prevention (CDC). Standard pre-surgical skin prep was conducted. Respiratory hygiene and cough etiquette was practiced. Hand hygiene observed. Safe injection practices and needle disposal techniques followed. SDV (single dose vial) medications used. Medications properly checked for expiration dates and contaminants. Personal protective equipment (PPE) used: Sterile Radiation-resistant gloves. Monitoring:  As per clinic protocol. Filed Vitals:   10/02/15 0850 10/02/15 0856 10/02/15 0900 10/02/15 0909  BP: 126/65 115/67 120/62  117/53  Pulse: 60 57 56 51  Temp:      TempSrc:      Resp: '16 14 11 10  ' Height:      Weight:      SpO2: 98% 98% 99% 96%  Calculated BMI: Body mass index is 26.62 kg/(m^2).  Description of Procedure Process:   Time-out: "Time-out" completed before starting procedure, as per protocol. Position: Prone Target Area: For Lumbar Facet blocks, the target is the groove formed by the junction of the transverse process and superior articular process. For the L5 dorsal ramus, the target is the notch between superior articular process and sacral ala. For the S1 dorsal ramus, the target is the superior and lateral edge of the posterior S1 Sacral foramen. Approach: Paramedial approach. Area Prepped: Entire Posterior Lumbosacral Region Prepping solution: ChloraPrep (2% chlorhexidine gluconate and 70% isopropyl alcohol) Safety Precautions: Aspiration looking for blood return was conducted prior to all injections. At no point did we inject any substances, as a needle was being advanced. No attempts were made at seeking any paresthesias. Safe injection practices and needle disposal techniques used. Medications properly checked for expiration dates. SDV (single dose vial) medications used.   Description of the Procedure: Protocol guidelines were followed. The patient was placed in position over the fluoroscopy table. The target area was identified and the area prepped in the usual manner. Skin desensitized using vapocoolant spray. Skin & deeper tissues infiltrated with local anesthetic. Appropriate amount of time allowed to pass for local anesthetics to take effect. The procedure needle was introduced through the skin, ipsilateral to the reported pain, and advanced to the target area. Employing the "Medial Branch Technique", the needles were advanced to the angle made by the superior and medial portion of the transverse process, and the lateral and inferior portion of the superior articulating process of the targeted  vertebral bodies. This area is known as "Burton's Eye" or the "Eye of the [place] Dog". A procedure needle was introduced through the skin, and this time advanced to the angle made by the superior and medial border of the sacral ala, and the lateral border of the S1  vertebral body. This last needle was later repositioned at the superior and lateral border of the posterior S1 foramen. Negative aspiration confirmed. Solution injected in intermittent fashion, asking for systemic symptoms every 0.5cc of injectate. The needles were then removed and the area cleansed, making sure to leave some of the prepping solution back to take advantage of its long term bactericidal properties. EBL: None Materials & Medications Used:  Needle(s) Used: 22g - 3.5" Spinal Needle(s)  Imaging Guidance:   Type of Imaging Technique: Fluoroscopy Guidance (Spinal) Indication(s): Assistance in needle guidance and placement for procedures requiring needle placement in or near specific anatomical locations not easily accessible without such assistance. Exposure Time: Please see nurses notes. Contrast: None required. Fluoroscopic Guidance: I was personally present in the fluoroscopy suite, where the patient was placed in position for the procedure, over the fluoroscopy-compatible table. Fluoroscopy was manipulated, using "Tunnel Vision Technique", to obtain the best possible view of the target area, on the affected side. Parallax error was corrected before commencing the procedure. A "direction-depth-direction" technique was used to introduce the needle under continuous pulsed fluoroscopic guidance. Once the target was reached, antero-posterior, oblique, and lateral fluoroscopic projection views were taken to confirm needle placement in all planes. Permanently recorded images stored by scanning into EMR. Interpretation: Intraoperative imaging interpretation by performing Physician. Adequate needle placement confirmed. Adequate needle  placement confirmed in AP, lateral, & Oblique Views. No contrast injected.  Antibiotic Prophylaxis:  Indication(s): No indications identified. Type:  Antibiotics Given (last 72 hours)    None       Post-operative Assessment:   Complications: No immediate post-treatment complications were observed. Disposition: Return to clinic for follow-up evaluation. The patient tolerated the entire procedure well. A repeat set of vitals were taken after the procedure and the patient was kept under observation following institutional policy, for this procedure. Post-procedural neurological assessment was performed, showing return to baseline, prior to discharge. The patient was discharged home, once institutional criteria were met. The patient was provided with post-procedure discharge instructions, including a section on how to identify potential problems. Should any problems arise concerning this procedure, the patient was given instructions to immediately contact us, at any time, without hesitation. In any case, we plan to contact the patient by telephone for a follow-up status report regarding this interventional procedure. Comments:  No additional relevant information.  Medications administered during this visit: We administered ropivacaine (PF) 2 mg/ml (0.2%), fentaNYL, midazolam, and triamcinolone acetonide.  Prescriptions ordered during this visit: New Prescriptions   No medications on file    Requested PM Follow-up: Return in about 2 weeks (around 10/17/2015) for Post-Procedure Eval (2 weeks).  Future Appointments Date Time Provider [place]  02/27/2016 9:00 AM Richard Maceo Pro., MD [id number] None    Primary Care Physician: Wilhemena Durie, MD Location: [facility] Note by: Kathlen Brunswick. Dossie Arbour, M.D, DABA, DABAPM, DABPM, DABIPP, FIPP   Illustration of the posterior view of the lumbar spine and the posterior neural structures. Laminae of L2 through  S1 are labeled. DPRL5, dorsal primary ramus of L5; DPRS1, dorsal primary ramus of S1; DPR3, dorsal primary ramus of L3; FJ, facet (zygapophyseal) joint L3-L4; I, inferior articular process of L4; LB1, lateral branch of dorsal primary ramus of L1; IAB, inferior articular branches from L3 medial branch (supplies L4-L5 facet joint); IBP, intermediate branch plexus; MB3, medial branch of dorsal primary ramus of L3; NR3, third lumbar nerve root; S, superior articular process of L5; SAB, superior articular branches  from L4 (supplies L4-5 facet joint also); TP3, transverse process of L3.  Disclaimer:  Medicine is not an Chief Strategy Officer. The only guarantee in medicine is that nothing is guaranteed. It is important to note that the decision to proceed with this intervention was based on the information collected from the patient. The Data and conclusions were drawn from the patient's questionnaire, the interview, and the physical examination. Because the information was provided in large part by the patient, it cannot be guaranteed that it has not been purposely or unconsciously manipulated. Every effort has been made to obtain as much relevant data as possible for this evaluation. It is important to note that the conclusions that lead to this procedure are derived in large part from the available data. Always take into account that the treatment will also be dependent on availability of resources and existing treatment guidelines, considered by other Pain Management Practitioners as being common knowledge and practice, at the time of the intervention. For Medico-Legal purposes, it is also important to point out that variation in procedural techniques and pharmacological choices are the acceptable norm. The indications, contraindications, technique, and results of the above procedure should only be interpreted and judged by a Board-Certified Interventional Pain Specialist with extensive familiarity and expertise in the same  exact procedure and technique. Attempts at providing opinions without similar or greater experience and expertise than that of the treating physician will be considered as inappropriate and unethical, and shall result in a formal complaint to the state medical board and applicable specialty societies.

## 2015-10-03 ENCOUNTER — Telehealth: Payer: Self-pay | Admitting: *Deleted

## 2015-10-03 NOTE — Telephone Encounter (Signed)
Spoke with patient re; procedure on yesterday, denies any questions or concerns.  

## 2015-10-15 ENCOUNTER — Ambulatory Visit: Payer: Medicare Other | Admitting: Pain Medicine

## 2015-10-18 ENCOUNTER — Encounter: Payer: Self-pay | Admitting: Pain Medicine

## 2015-10-18 ENCOUNTER — Ambulatory Visit: Payer: Medicare Other | Attending: Pain Medicine | Admitting: Pain Medicine

## 2015-10-18 VITALS — BP 135/80 | HR 63 | Temp 98.4°F | Resp 16 | Ht 67.0 in | Wt 170.0 lb

## 2015-10-18 DIAGNOSIS — E785 Hyperlipidemia, unspecified: Secondary | ICD-10-CM | POA: Diagnosis not present

## 2015-10-18 DIAGNOSIS — Z86010 Personal history of colonic polyps: Secondary | ICD-10-CM | POA: Insufficient documentation

## 2015-10-18 DIAGNOSIS — I2510 Atherosclerotic heart disease of native coronary artery without angina pectoris: Secondary | ICD-10-CM | POA: Diagnosis not present

## 2015-10-18 DIAGNOSIS — M47816 Spondylosis without myelopathy or radiculopathy, lumbar region: Secondary | ICD-10-CM | POA: Diagnosis not present

## 2015-10-18 DIAGNOSIS — M4316 Spondylolisthesis, lumbar region: Secondary | ICD-10-CM | POA: Diagnosis not present

## 2015-10-18 DIAGNOSIS — D291 Benign neoplasm of prostate: Secondary | ICD-10-CM | POA: Diagnosis not present

## 2015-10-18 DIAGNOSIS — M549 Dorsalgia, unspecified: Secondary | ICD-10-CM | POA: Diagnosis present

## 2015-10-18 DIAGNOSIS — D649 Anemia, unspecified: Secondary | ICD-10-CM | POA: Insufficient documentation

## 2015-10-18 DIAGNOSIS — I252 Old myocardial infarction: Secondary | ICD-10-CM | POA: Insufficient documentation

## 2015-10-18 DIAGNOSIS — Z6825 Body mass index (BMI) 25.0-25.9, adult: Secondary | ICD-10-CM | POA: Insufficient documentation

## 2015-10-18 DIAGNOSIS — I340 Nonrheumatic mitral (valve) insufficiency: Secondary | ICD-10-CM | POA: Insufficient documentation

## 2015-10-18 DIAGNOSIS — E663 Overweight: Secondary | ICD-10-CM | POA: Insufficient documentation

## 2015-10-18 DIAGNOSIS — Z951 Presence of aortocoronary bypass graft: Secondary | ICD-10-CM | POA: Insufficient documentation

## 2015-10-18 DIAGNOSIS — M2578 Osteophyte, vertebrae: Secondary | ICD-10-CM | POA: Insufficient documentation

## 2015-10-18 DIAGNOSIS — N200 Calculus of kidney: Secondary | ICD-10-CM | POA: Diagnosis not present

## 2015-10-18 DIAGNOSIS — K219 Gastro-esophageal reflux disease without esophagitis: Secondary | ICD-10-CM | POA: Diagnosis not present

## 2015-10-18 DIAGNOSIS — H409 Unspecified glaucoma: Secondary | ICD-10-CM | POA: Insufficient documentation

## 2015-10-18 DIAGNOSIS — M545 Low back pain: Secondary | ICD-10-CM

## 2015-10-18 DIAGNOSIS — I10 Essential (primary) hypertension: Secondary | ICD-10-CM | POA: Diagnosis not present

## 2015-10-18 DIAGNOSIS — M4806 Spinal stenosis, lumbar region: Secondary | ICD-10-CM | POA: Diagnosis not present

## 2015-10-18 DIAGNOSIS — Z87891 Personal history of nicotine dependence: Secondary | ICD-10-CM | POA: Insufficient documentation

## 2015-10-18 DIAGNOSIS — G8929 Other chronic pain: Secondary | ICD-10-CM

## 2015-10-18 DIAGNOSIS — K222 Esophageal obstruction: Secondary | ICD-10-CM | POA: Insufficient documentation

## 2015-10-18 NOTE — Progress Notes (Signed)
Patient's Name: Austin [name]  Patient type: Established  MRN: [id number]  Service setting: Ambulatory outpatient  DOB: 04-07-1929  Location: [facility]  DOS: 10/18/2015  Primary Care Physician: Wilhemena Durie, MD  Note by: Kathlen Brunswick. Dossie Arbour, M.D, DABA, Sarita Haver, DABPM, Milagros Evener, FIPP  Referring Physician: Jerrol Banana.,*  Specialty: Board-Certified Interventional Pain Management  Last Visit to Pain Management: 10/03/2015   Primary Reason(s) for Visit: Encounter for post-procedure evaluation of chronic illness with mild to moderate exacerbation CC: Back Pain   HPI  Mr. Flocco is a 82 y.o. year old, male patient, who returns today as an established patient. He has History of colonic polyps; Absolute anemia; Benign essential HTN; Benign fibroma of prostate; Atherosclerosis of coronary artery; Cataract; Esophageal stenosis; Acid reflux; Glaucoma; Hemorrhoid; HLD (hyperlipidemia); Calculus of kidney; Obstructive apnea; Borderline diabetes; Peripheral blood vessel disorder ([place]); Esophagogastric ring; Heart valve disease; MI (mitral incompetence); Cardiomyopathy, ischemic; Coronary artery disease involving native coronary artery of native heart with unstable angina pectoris ([place]); Bradycardia; Chronic low back pain (Location of Primary Source of Pain) (Bilateral) (R>L); Lumbar spondylosis; Lumbar facet syndrome (Bilateral) (R>L); Peripheral vascular disease ([place]); Lumbar central spinal stenosis (severe at L4-5); Lumbar facet hypertrophy (multilevel); Grade 1 Anterolisthesis of L3 over L4; Lumbar foraminal stenosis (Right L2-3; Bilateral L3-4, L4-5); Chronic pain; and Hyperglycemia on his problem list.. His primarily concern today is the Back Pain   Pain Assessment: Self-Reported Pain Score: 0-No pain Reported level is compatible with observation Pain Type: Chronic pain  The patient comes into the clinics today for post-procedure evaluation on the interventional  treatment done on 10/02/2015.He is extremely happy with the results at this point he is not having any problems. We will see him PRN.  Date of Last Visit: 10/02/15 Service Provided on Last Visit: Procedure (blf)  Post-Procedure Assessment  Procedure done on last visit: Diagnostic bilateral lumbar facet block #2 under fluoroscopic guidance and IV sedation done on 10/02/2015. The initial one provided the patient with 90% relief of the pain for the first hour, 80% for the following 4-6 hours, followed by an increased to 100% that lasted from 08/21/2015 until May 2017. Side-effects or Adverse reactions: None reported Sedation: Please see nurses note  Results: Ultra-Short Term Relief (First 1 hour after procedure): 100 %  Analgesia during this period is likely to be Local Anesthetic and/or IV Sedative (Analgesic/Anxiolitic) related Short Term Relief (Initial 4-6 hrs after procedure): 100 % Complete relief confirms area to be the source of pain Long Term Relief : 100 % Long-term benefit would suggest an inflammatory etiology to the pain   Current Relief (Now): 100%  Persistent relief would suggest effective anti-inflammatory effects from steroids Interpretation of Results: Clearly this mode of therapy is very effective as a palliative treatment for this patient's low back and leg pain.  Laboratory Chemistry  Inflammation Markers No results found for: ESRSEDRATE, CRP  Renal Function Lab Results  Component Value Date   BUN 20 01/08/2015   CREATININE 1.66* 01/08/2015   GFRAA 43* 01/08/2015   GFRNONAA 37* 01/08/2015    Hepatic Function Lab Results  Component Value Date   AST 17 01/08/2015   ALT 20 01/08/2015   ALBUMIN 4.6 01/08/2015    Electrolytes Lab Results  Component Value Date   NA 138 01/08/2015   K 4.9 01/08/2015   CL 98 01/08/2015   CALCIUM 9.1 01/08/2015    Pain Modulating Vitamins No results found for: [place], [id number], [id number], [id number],  [id number]  Coagulation Parameters No results found for: INR, LABPROT  Note: I personally reviewed the above data. Results made available to patient.  Recent Diagnostic Imaging  Dg Lumbar Spine 2-3 Views  04/30/2015  CLINICAL DATA:  3-4 year history of low back pain with worsening symptoms over the past 3 months without known injury, no radicular symptoms. EXAM: LUMBAR SPINE - 2-3 VIEW COMPARISON:  AP view of the abdomen of April 02, 2010. Report of an MRI of the lumbar spine dated December 15, 2009. FINDINGS: The lumbar vertebral bodies are preserved in height. There is moderate disc space narrowing at L4-5 and L5-S1 with milder narrowing at L2-3 and at L3-4. There is no spondylolisthesis. There are moderate-sized anterior endplate osteophytes at all lumbar levels. There is a small posterior osteophyte inferiorly at L4. There is facet joint hypertrophy at L4-5 and at L5-S1. The pedicles and transverse processes are intact. The observed portions of the sacrum are intact. IMPRESSION: There is moderate multilevel degenerative disc space loss centered from L3 inferiorly. There is facet joint hypertrophy at L4-5 and L5-S1. There is no compression fracture. Electronically Signed   By: David  Martinique M.D.   On: 04/30/2015 10:10    Meds  The patient has a current medication list which includes the following prescription(s): amlodipine, aspirin, atorvastatin, biotin, chlorhexidine, vitamin d3, coenzyme q10, cyanocobalamin, diflunisal, dorzolamide-timolol, glucosamine-chondroitin, isosorbide mononitrate, nitroglycerin, omega-3 fatty acids, omeprazole, tamsulosin, timolol, travoprost (bak free), and valsartan-hydrochlorothiazide.  Current Outpatient Prescriptions on File Prior to Visit  Medication Sig  . amLODipine (NORVASC) 5 MG tablet Take 5 mg by mouth daily.  Marland Kitchen aspirin 81 MG tablet Take by mouth daily.   Marland Kitchen atorvastatin (LIPITOR) 20 MG tablet Take by mouth daily.   . Biotin 5000 MCG CAPS Take by mouth  daily.   . chlorhexidine (PERIDEX) 0.12 % solution Reported on 08/21/2015  . Cholecalciferol (VITAMIN D3) 2000 UNITS capsule Take 2,000 Units by mouth 2 (two) times daily.   Marland Kitchen COENZYME Q-10 PO Take 200 mg by mouth daily.   . Cyanocobalamin 100 MCG LOZG Take 1,000 mcg by mouth daily.   . diflunisal (DOLOBID) 500 MG TABS tablet Reported on 08/21/2015  . dorzolamide-timolol (COSOPT) 22.3-6.8 MG/ML ophthalmic solution   . glucosamine-chondroitin 500-400 MG tablet Take 1 tablet by mouth 2 (two) times daily.  . isosorbide mononitrate (IMDUR) 30 MG 24 hr tablet Take by mouth.  . nitroGLYCERIN (NITROSTAT) 0.4 MG SL tablet Place under the tongue every 5 (five) minutes as needed.   . OMEGA-3 FATTY ACIDS PO Take 1,200 mcg by mouth 2 (two) times daily.   Marland Kitchen omeprazole (PRILOSEC) 20 MG capsule Reported on 08/21/2015  . tamsulosin (FLOMAX) 0.4 MG CAPS capsule TAKE 1 CAPSULE BY MOUTH EVERY DAY  . timolol (BETIMOL) 0.5 % ophthalmic solution Place 1 drop into both eyes 2 (two) times daily.  . Travoprost, BAK Free, (TRAVATAN Z) 0.004 % SOLN ophthalmic solution Place 1 drop into both eyes at bedtime.   . valsartan-hydrochlorothiazide (DIOVAN HCT) 320-25 MG per tablet Take 1 tablet by mouth daily.    No current facility-administered medications on file prior to visit.    ROS  Constitutional: Denies any fever or chills Gastrointestinal: No reported hemesis, hematochezia, vomiting, or acute GI distress Musculoskeletal: Denies any acute onset joint swelling, redness, loss of ROM, or weakness Neurological: No reported episodes of acute onset apraxia, aphasia, dysarthria, agnosia, amnesia, paralysis, loss of coordination, or loss of consciousness  Allergies  Mr. Schoof is allergic to penicillins.  [place]  Medical:  Mr. Hildebrand  has a past medical history of Myocardial infarction [facility]) (2011); Hypertension; Arthritis; GERD (gastroesophageal reflux disease); and Allergy. Family: family history includes Heart disease  in his brother, brother, brother, brother, father, and mother. Surgical:  has past surgical history that includes Shoulder surgery (Right); Coronary artery bypass graft (2007); Cardiac catheterization (2007); Colonoscopy (N/A, 10/02/2014); Tonsillectomy; Hemorroidectomy; Carpal tunnel release (Bilateral); Cardiac catheterization (N/A, 04/17/2015); and Lumbar epidural injection (2011). Tobacco:  reports that he has quit smoking. His smoking use included Cigarettes. He quit after 15 years of use. He has never used smokeless tobacco. Alcohol:  reports that he drinks alcohol. Drug:  reports that he does not use illicit drugs.  Constitutional Exam  Vitals: Blood pressure 135/80, pulse 63, temperature 98.4 F (36.9 C), resp. rate 16, height 5\' 7"  (1.702 m), weight 170 lb (77.111 kg), SpO2 100 %. General appearance: Well nourished, well developed, and well hydrated. In no acute distress Calculated BMI/Body habitus: Body mass index is 26.62 kg/(m^2). (25-29.9 kg/m2) Overweight - 20% higher incidence of chronic pain Psych/Mental status: Alert and oriented x 3 (person, place, & time) Eyes: PERLA Respiratory: No evidence of acute respiratory distress  Cervical Spine Exam  Inspection: No masses, redness, or swelling Alignment: Symmetrical ROM: Functional: ROM is within functional limits [facility]) Stability: No instability detected Muscle strength & Tone: Functionally intact Sensory: Unimpaired Palpation: No complaints of tenderness  Upper Extremity (UE) Exam    Side: Right upper extremity  Side: Left upper extremity  Inspection: No masses, redness, swelling, or asymmetry  Inspection: No masses, redness, swelling, or asymmetry  ROM:  ROM:  Functional: ROM is within functional limits [facility])  Functional: ROM is within functional limits [facility])  Muscle strength & Tone: Functionally intact  Muscle strength & Tone: Functionally intact  Sensory: Unimpaired  Sensory: Unimpaired  Palpation: Non-contributory   Palpation: Non-contributory   Thoracic Spine Exam  Inspection: No masses, redness, or swelling Alignment: Symmetrical ROM: Functional: ROM is within functional limits [facility]) Stability: No instability detected Sensory: Unimpaired Muscle strength & Tone: Functionally intact Palpation: No complaints of tenderness  Lumbar Spine Exam  Inspection: No masses, redness, or swelling Alignment: Symmetrical ROM: Functional: ROM is within functional limits [facility]) Stability: No instability detected Muscle strength & Tone: Functionally intact Sensory: Unimpaired Palpation: No complaints of tenderness Provocative Tests: Lumbar Hyperextension and rotation test: deferred Patrick's Maneuver: deferred  Gait & Posture Assessment  Ambulation: Unassisted Gait: Unaffected Posture: WNL  Lower Extremity Exam    Side: Right lower extremity  Side: Left lower extremity  Inspection: No masses, redness, swelling, or asymmetry ROM:  Inspection: No masses, redness, swelling, or asymmetry ROM:  Functional: ROM is within functional limits [facility])  Functional: ROM is within functional limits [facility])  Muscle strength & Tone: Functionally intact  Muscle strength & Tone: Functionally intact  Sensory: Unimpaired  Sensory: Unimpaired  Palpation: Non-contributory  Palpation: Non-contributory   Assessment & Plan  Primary Diagnosis & Pertinent Problem List: The primary encounter diagnosis was Chronic low back pain (Location of Primary Source of Pain) (Bilateral) (R>L). A diagnosis of Lumbar facet syndrome (Bilateral) (R>L) was also pertinent to this visit.  Visit Diagnosis: 1. Chronic low back pain (Location of Primary Source of Pain) (Bilateral) (R>L)   2. Lumbar facet syndrome (Bilateral) (R>L)     Problem-specific Plan(s): No problem-specific assessment & plan notes found for this encounter.   Plan of Care   Problem List Items Addressed This Visit      High  Chronic low back pain (Location of Primary  Source of Pain) (Bilateral) (R>L) - Primary (Chronic)   Lumbar facet syndrome (Bilateral) (R>L) (Chronic)       Pharmacotherapy (Medications Ordered): No orders of the defined types were placed in this encounter.    Lab-work & Procedure Ordered: No orders of the defined types were placed in this encounter.    Imaging Ordered: None  Interventional Therapies: Scheduled:  None at this time    Considering:  Lumbar facet radiofrequency versus lumbar epidural steroid injection depending on where his pain is.    PRN Procedures:   1. Palliative bilateral lumbar facet block under fluoroscopic guidance and IV sedation #3.  2. Palliative right-sided L4-5 lumbar epidural steroid injection under fluoroscopic guidance, no sedation.  3. Diagnostic right-sided L2-3 transforaminal epidural steroid injection + bilateral L3-4 and/or L4-5 transforaminal epidural steroid injection under fluoroscopic guidance, with or without sedation, depending on the location of the pain at the time of the visit.    Referral(s) or Consult(s): None at this time.  Medications administered during this visit: Mr. Arrant had no medications administered during this visit.  Requested PM Follow-up: Return if symptoms worsen or fail to improve, for Procedure (PRN - Patient will call).  Future Appointments Date Time Provider [place]  02/27/2016 9:00 AM Richard Maceo Pro., MD [id number] None    Primary Care Physician: Wilhemena Durie, MD Location: [facility] Note by: Kathlen Brunswick. Dossie Arbour, M.D, DABA, DABAPM, DABPM, DABIPP, FIPP  Pain Score Disclaimer: We use the NRS-11 scale. This is a self-reported, subjective measurement of pain severity with only modest accuracy. It is used primarily to identify changes within a particular patient. It must be understood that outpatient pain scales are significantly less accurate that those used for research, where they can be applied under  ideal controlled circumstances with minimal exposure to variables. In reality, the score is likely to be a combination of pain intensity and pain affect, where pain affect describes the degree of emotional arousal or changes in action readiness caused by the sensory experience of pain. Factors such as social and work situation, setting, emotional state, anxiety levels, expectation, and prior pain experience may influence pain perception and show large inter-individual differences that may also be affected by time variables.  Patient instructions provided during this appointment: There are no Patient Instructions on file for this visit.

## 2015-10-18 NOTE — Progress Notes (Signed)
Safety precautions to be maintained throughout the outpatient stay will include: orient to surroundings, keep bed in low position, maintain call bell within reach at all times, provide assistance with transfer out of bed and ambulation.  

## 2015-11-07 DIAGNOSIS — E785 Hyperlipidemia, unspecified: Secondary | ICD-10-CM | POA: Diagnosis not present

## 2015-11-07 DIAGNOSIS — I6523 Occlusion and stenosis of bilateral carotid arteries: Secondary | ICD-10-CM | POA: Diagnosis not present

## 2015-11-07 DIAGNOSIS — I2510 Atherosclerotic heart disease of native coronary artery without angina pectoris: Secondary | ICD-10-CM | POA: Diagnosis not present

## 2015-11-07 DIAGNOSIS — I10 Essential (primary) hypertension: Secondary | ICD-10-CM | POA: Diagnosis not present

## 2015-11-26 ENCOUNTER — Other Ambulatory Visit: Payer: Self-pay | Admitting: Family Medicine

## 2015-12-24 DIAGNOSIS — H353132 Nonexudative age-related macular degeneration, bilateral, intermediate dry stage: Secondary | ICD-10-CM | POA: Diagnosis not present

## 2016-01-14 DIAGNOSIS — I6523 Occlusion and stenosis of bilateral carotid arteries: Secondary | ICD-10-CM | POA: Diagnosis not present

## 2016-01-14 DIAGNOSIS — I10 Essential (primary) hypertension: Secondary | ICD-10-CM | POA: Diagnosis not present

## 2016-01-14 DIAGNOSIS — I340 Nonrheumatic mitral (valve) insufficiency: Secondary | ICD-10-CM | POA: Diagnosis not present

## 2016-01-14 DIAGNOSIS — R001 Bradycardia, unspecified: Secondary | ICD-10-CM | POA: Diagnosis not present

## 2016-01-14 DIAGNOSIS — I252 Old myocardial infarction: Secondary | ICD-10-CM | POA: Diagnosis not present

## 2016-01-14 DIAGNOSIS — I25810 Atherosclerosis of coronary artery bypass graft(s) without angina pectoris: Secondary | ICD-10-CM | POA: Diagnosis not present

## 2016-01-21 DIAGNOSIS — R001 Bradycardia, unspecified: Secondary | ICD-10-CM | POA: Diagnosis not present

## 2016-01-22 DIAGNOSIS — R001 Bradycardia, unspecified: Secondary | ICD-10-CM | POA: Diagnosis not present

## 2016-01-22 DIAGNOSIS — I493 Ventricular premature depolarization: Secondary | ICD-10-CM | POA: Diagnosis not present

## 2016-01-22 DIAGNOSIS — I25810 Atherosclerosis of coronary artery bypass graft(s) without angina pectoris: Secondary | ICD-10-CM | POA: Diagnosis not present

## 2016-02-06 DIAGNOSIS — I493 Ventricular premature depolarization: Secondary | ICD-10-CM | POA: Insufficient documentation

## 2016-02-27 ENCOUNTER — Ambulatory Visit (INDEPENDENT_AMBULATORY_CARE_PROVIDER_SITE_OTHER): Payer: Medicare Other | Admitting: Family Medicine

## 2016-02-27 ENCOUNTER — Encounter: Payer: Self-pay | Admitting: Family Medicine

## 2016-02-27 VITALS — BP 112/58 | HR 56 | Temp 97.7°F | Resp 16 | Ht 67.0 in | Wt 172.0 lb

## 2016-02-27 DIAGNOSIS — E785 Hyperlipidemia, unspecified: Secondary | ICD-10-CM | POA: Diagnosis not present

## 2016-02-27 DIAGNOSIS — Z23 Encounter for immunization: Secondary | ICD-10-CM

## 2016-02-27 DIAGNOSIS — R739 Hyperglycemia, unspecified: Secondary | ICD-10-CM | POA: Diagnosis not present

## 2016-02-27 DIAGNOSIS — I10 Essential (primary) hypertension: Secondary | ICD-10-CM

## 2016-02-27 DIAGNOSIS — I25110 Atherosclerotic heart disease of native coronary artery with unstable angina pectoris: Secondary | ICD-10-CM

## 2016-02-27 DIAGNOSIS — Z0000 Encounter for general adult medical examination without abnormal findings: Secondary | ICD-10-CM | POA: Diagnosis not present

## 2016-02-27 NOTE — Progress Notes (Signed)
Patient: Austin [name], Male    DOB: 04-02-1929, 82 y.o.   MRN: [id number] Visit Date: 02/27/2016  Today's Provider: Wilhemena Durie, MD   Chief Complaint  Patient presents with  . Annual Exam   Subjective:    Annual wellness visit Austin [name] is a 82 y.o. male. He feels well. He reports he is exercising. Plays golf about twice weekly, and keeps up with yard work.Marland Kitchen He reports he is sleeping well.  Last colonoscopy- 10/02/2014- diverticulosis- internal hemorrhoids. 1 polyp- no dysplasia seen.   -----------------------------------------------------------   Review of Systems  Constitutional: Negative.   HENT: Negative.   Eyes: Negative.   Respiratory: Positive for shortness of breath. Negative for apnea, cough, choking, chest tightness, wheezing and stridor.   Cardiovascular: Negative.   Gastrointestinal: Negative.   Endocrine: Negative.   Genitourinary: Negative.   Musculoskeletal: Positive for arthralgias. Negative for back pain, gait problem, joint swelling, myalgias, neck pain and neck stiffness.  Skin: Negative.   Allergic/Immunologic: Negative.   Neurological: Negative.   Hematological: Negative.   Psychiatric/Behavioral: Negative.     Social History   Social History  . Marital status: Married    Spouse name: Blanch Media  . Number of children: 1  . Years of education: N/A   Occupational History  .  Retired   Social History Main Topics  . Smoking status: Former Smoker    Years: 15.00    Types: Cigarettes  . Smokeless tobacco: Never Used  . Alcohol use Yes     Comment: 1-2 glasses of wine a night  . Drug use: No  . Sexual activity: No   Other Topics Concern  . Not on file   Social History Narrative  . No narrative on file    Past Medical History:  Diagnosis Date  . Allergy   . Arthritis   . GERD (gastroesophageal reflux disease)   . Hypertension   . Myocardial infarction [facility]) 2011     Patient Active Problem List   Diagnosis  Date Noted  . Hyperglycemia 08/27/2015  . Chronic pain 08/21/2015  . Peripheral vascular disease ([place]) 08/14/2015  . Lumbar central spinal stenosis (severe at L4-5) 08/14/2015  . Lumbar facet hypertrophy (multilevel) 08/14/2015  . Grade 1 Anterolisthesis of L3 over L4 08/14/2015  . Lumbar foraminal stenosis (Right L2-3; Bilateral L3-4, L4-5) 08/14/2015  . Chronic low back pain (Location of Primary Source of Pain) (Bilateral) (R>L) 08/13/2015  . Lumbar spondylosis 08/13/2015  . Lumbar facet syndrome (Bilateral) (R>L) 08/13/2015  . Bradycardia 05/03/2015  . Coronary artery disease involving native coronary artery of native heart with unstable angina pectoris ([place]) 01/08/2015  . Absolute anemia 12/08/2014  . Benign fibroma of prostate 12/08/2014  . Atherosclerosis of coronary artery 12/08/2014  . Cataract 12/08/2014  . Esophageal stenosis 12/08/2014  . Acid reflux 12/08/2014  . Glaucoma 12/08/2014  . Hemorrhoid 12/08/2014  . HLD (hyperlipidemia) 12/08/2014  . Calculus of kidney 12/08/2014  . Obstructive apnea 12/08/2014  . Borderline diabetes 12/08/2014  . Peripheral blood vessel disorder ([place]) 12/08/2014  . Esophagogastric ring 12/08/2014  . Heart valve disease 12/08/2014  . MI (mitral incompetence) 10/03/2014  . Cardiomyopathy, ischemic 10/03/2014  . History of colonic polyps 10/02/2014  . Benign essential HTN 09/20/2014    Past Surgical History:  Procedure Laterality Date  . CARDIAC CATHETERIZATION  2007  . CARDIAC CATHETERIZATION N/A 04/17/2015   Procedure: Left Heart Cath and Coronary Angiography;  Surgeon: Corey Skains, MD;  Location: [facility];  Service: Cardiovascular;  Laterality: N/A;  . CARPAL TUNNEL RELEASE Bilateral    right 06/09/08 left 05/03/09  . COLONOSCOPY N/A 10/02/2014   Procedure: COLONOSCOPY;  Surgeon: Manya Silvas, MD;  Location: [facility] ENDOSCOPY;  Service: Endoscopy;  Laterality: N/A;  . CORONARY ARTERY BYPASS GRAFT  2007  .  HEMORROIDECTOMY    . LUMBAR EPIDURAL INJECTION  2011  . MOUTH SURGERY    . SHOULDER SURGERY Right   . TONSILLECTOMY      His family history includes Heart disease in his brother, brother, brother, brother, father, and mother.    Current Meds  Medication Sig  . amLODipine (NORVASC) 5 MG tablet Take 5 mg by mouth daily.  Marland Kitchen aspirin 81 MG tablet Take by mouth daily.   Marland Kitchen atorvastatin (LIPITOR) 20 MG tablet Take by mouth daily.   . Biotin 5000 MCG CAPS Take by mouth daily.   . Cholecalciferol (VITAMIN D3) 2000 UNITS capsule Take 2,000 Units by mouth 2 (two) times daily.   Marland Kitchen COENZYME Q-10 PO Take 200 mg by mouth daily.   . Cyanocobalamin 100 MCG LOZG Take 1,000 mcg by mouth daily.   . dorzolamide-timolol (COSOPT) 22.3-6.8 MG/ML ophthalmic solution   . glucosamine-chondroitin 500-400 MG tablet Take 1 tablet by mouth 2 (two) times daily.  . isosorbide mononitrate (IMDUR) 30 MG 24 hr tablet Take by mouth.  . nitroGLYCERIN (NITROSTAT) 0.4 MG SL tablet Place under the tongue every 5 (five) minutes as needed.   . OMEGA-3 FATTY ACIDS PO Take 1,200 mcg by mouth 2 (two) times daily.   Marland Kitchen omeprazole (PRILOSEC) 20 MG capsule Reported on 08/21/2015  . tamsulosin (FLOMAX) 0.4 MG CAPS capsule TAKE 1 CAPSULE BY MOUTH EVERY DAY  . Travoprost, BAK Free, (TRAVATAN Z) 0.004 % SOLN ophthalmic solution Place 1 drop into both eyes at bedtime.   . valsartan-hydrochlorothiazide (DIOVAN HCT) 320-25 MG per tablet Take 1 tablet by mouth daily.   . [DISCONTINUED] chlorhexidine (PERIDEX) 0.12 % solution Reported on 08/21/2015  . [DISCONTINUED] diflunisal (DOLOBID) 500 MG TABS tablet Reported on 08/21/2015  . [DISCONTINUED] timolol (BETIMOL) 0.5 % ophthalmic solution Place 1 drop into both eyes 2 (two) times daily.    Patient Care Team: Jerrol Banana., MD as PCP - General (Family Medicine)    Objective:   Vitals: BP (!) 112/58 (BP Location: Right Arm, Patient Position: Sitting, Cuff Size: Normal)   Pulse (!) 56    Temp 97.7 F (36.5 C) (Oral)   Resp 16   Ht 5\' 7"  (1.702 m)   Wt 172 lb (78 kg)   BMI 26.94 kg/m   Physical Exam  Constitutional: He is oriented to person, place, and time. He appears well-developed and well-nourished. No distress.  HENT:  Head: Normocephalic and atraumatic.  Right Ear: External ear normal.  Left Ear: External ear normal.  Nose: Nose normal.  Mouth/Throat: Oropharynx is clear and moist. No oropharyngeal exudate.  Eyes: Conjunctivae and EOM are normal. Pupils are equal, round, and reactive to light. Right eye exhibits no discharge. Left eye exhibits no discharge. No scleral icterus.  Neck: Normal range of motion. Neck supple. No JVD present. No tracheal deviation present. No thyromegaly present.  Cardiovascular: Normal rate, regular rhythm, normal heart sounds and intact distal pulses.  Exam reveals no gallop and no friction rub.   No murmur heard. Pulmonary/Chest: Effort normal and breath sounds normal. No stridor. No respiratory distress. He has no wheezes. He has no rales.  He exhibits no tenderness.  Abdominal: Soft. Bowel sounds are normal. He exhibits no distension and no mass. There is no tenderness. There is no rebound and no guarding.  Musculoskeletal: Normal range of motion. He exhibits no edema, tenderness or deformity.  Lymphadenopathy:    He has no cervical adenopathy.  Neurological: He is alert and oriented to person, place, and time. He has normal reflexes. He displays normal reflexes. No cranial nerve deficit. He exhibits normal muscle tone. Coordination normal.  Skin: Skin is warm and dry. No rash noted. He is not diaphoretic. No erythema. No pallor.  Psychiatric: He has a normal mood and affect. His behavior is normal. Judgment and thought content normal.    Activities of Daily Living In your present state of health, do you have any difficulty performing the following activities: 02/27/2016 04/30/2015  Hearing? N Y  Vision? N N  Difficulty  concentrating or making decisions? N N  Walking or climbing stairs? N N  Dressing or bathing? N N  Doing errands, shopping? N N  Some recent data might be hidden    Fall Risk Assessment Fall Risk  02/27/2016 10/18/2015 10/02/2015 09/06/2015 08/21/2015  Falls in the past year? No No No No No     Depression Screen PHQ 2/9 Scores 02/27/2016 10/18/2015 10/02/2015 09/06/2015  PHQ - 2 Score 0 0 0 0  Exception Documentation - - - Patient refusal    Cognitive Testing - 6-CIT  Correct? Score   What year is it? yes 0 0 or 4  What month is it? Yes 0 0 or 3  Memorize:    Pia Mau,  44,  [address]  [place],      What time is it? (within 1 hour) yes 0 0 or 3  Count backwards from 20 yes 0 0, 2, or 4  Name the months of the year yes 0 0, 2, or 4  Repeat name & address above no 2 0, 2, 4, 6, 8, or 10       TOTAL SCORE  2/28   Interpretation:  Normal  Normal (0-7) Abnormal (8-28)       Assessment & Plan:     Annual Wellness Visit  Reviewed patient's Family Medical History Reviewed and updated list of patient's medical providers Assessment of cognitive impairment was done Assessed patient's functional ability Established a written schedule for health screening [place] Completed and Reviewed  Exercise Activities and Dietary recommendations Goals    None      Immunization History  Administered Date(s) Administered  . Influenza-Unspecified 03/03/2015  . Pneumococcal Conjugate-13 12/14/2013  . Pneumococcal Polysaccharide-23 04/04/1998, 03/25/2006  . Td 05/01/2008  . Tdap 02/25/2011  . Zoster 10/07/2011    Health Maintenance  Topic Date Due  . INFLUENZA VACCINE  04/29/2016 (Originally 01/01/2016)  . TETANUS/TDAP  02/24/2021  . ZOSTAVAX  Completed  . PNA vac Low Risk Adult  Completed      Discussed health benefits of physical activity, and encouraged him to engage in regular exercise appropriate for his age and condition.      ------------------------------------------------------------------------------------------------------------ 1. Medicare annual wellness visit, subsequent Stable. As above.  2. Flu vaccine need Administered today. - Flu vaccine HIGH DOSE PF  3. Benign essential HTN Stable. Continue medications and FU pending lab results. - CBC with Differential/Platelet - Comprehensive metabolic panel  4. Coronary artery disease involving native coronary artery of native heart with unstable angina pectoris ([place]) FU with cardiology as scheduled.  5.  HLD (hyperlipidemia) FU pending results. - TSH - Lipid panel  6. Hyperglycemia FU pending results. - Hemoglobin A1c   Patient seen and examined by Miguel Aschoff, MD, and note scribed by Renaldo Fiddler, CMA.  I have done the exam and reviewed the above chart and it is accurate to the best of my knowledge.  Richard Cranford Mon, MD  [facility]

## 2016-02-28 LAB — LIPID PANEL
CHOL/HDL RATIO: 3 ratio (ref 0.0–5.0)
CHOLESTEROL TOTAL: 147 mg/dL (ref 100–199)
HDL: 49 mg/dL (ref >39)
LDL CALC: 83 mg/dL (ref 0–99)
Triglycerides: 76 mg/dL (ref 0–149)
VLDL CHOLESTEROL CAL: 15 mg/dL (ref 5–40)

## 2016-02-28 LAB — CBC WITH DIFFERENTIAL/PLATELET
BASOS: 0 %
Basophils Absolute: 0 10*3/uL (ref 0.0–0.2)
EOS (ABSOLUTE): 0.2 10*3/uL (ref 0.0–0.4)
EOS: 3 %
HEMATOCRIT: 42.3 % (ref 37.5–51.0)
HEMOGLOBIN: 14 g/dL (ref 12.6–17.7)
Immature Grans (Abs): 0 10*3/uL (ref 0.0–0.1)
Immature Granulocytes: 0 %
LYMPHS ABS: 1.4 10*3/uL (ref 0.7–3.1)
Lymphs: 24 %
MCH: 30.1 pg (ref 26.6–33.0)
MCHC: 33.1 g/dL (ref 31.5–35.7)
MCV: 91 fL (ref 79–97)
MONOCYTES: 10 %
Monocytes Absolute: 0.6 10*3/uL (ref 0.1–0.9)
NEUTROS ABS: 3.7 10*3/uL (ref 1.4–7.0)
Neutrophils: 63 %
Platelets: 159 10*3/uL (ref 150–379)
RBC: 4.65 x10E6/uL (ref 4.14–5.80)
RDW: 14.4 % (ref 12.3–15.4)
WBC: 6 10*3/uL (ref 3.4–10.8)

## 2016-02-28 LAB — COMPREHENSIVE METABOLIC PANEL
ALBUMIN: 4.4 g/dL (ref 3.5–4.7)
ALK PHOS: 81 IU/L (ref 39–117)
ALT: 29 IU/L (ref 0–44)
AST: 21 IU/L (ref 0–40)
Albumin/Globulin Ratio: 1.8 (ref 1.2–2.2)
BILIRUBIN TOTAL: 0.7 mg/dL (ref 0.0–1.2)
BUN / CREAT RATIO: 16 (ref 10–24)
BUN: 25 mg/dL (ref 8–27)
CO2: 24 mmol/L (ref 18–29)
Calcium: 9.3 mg/dL (ref 8.6–10.2)
Chloride: 100 mmol/L (ref 96–106)
Creatinine, Ser: 1.61 mg/dL — ABNORMAL HIGH (ref 0.76–1.27)
GFR calc Af Amer: 44 mL/min/{1.73_m2} — ABNORMAL LOW (ref >59)
GFR calc non Af Amer: 38 mL/min/{1.73_m2} — ABNORMAL LOW (ref >59)
GLOBULIN, TOTAL: 2.5 g/dL (ref 1.5–4.5)
Glucose: 113 mg/dL — ABNORMAL HIGH (ref 65–99)
Potassium: 4.4 mmol/L (ref 3.5–5.2)
SODIUM: 139 mmol/L (ref 134–144)
Total Protein: 6.9 g/dL (ref 6.0–8.5)

## 2016-02-28 LAB — HEMOGLOBIN A1C
Est. average glucose Bld gHb Est-mCnc: 108 mg/dL
Hgb A1c MFr Bld: 5.4 % (ref 4.8–5.6)

## 2016-02-28 LAB — TSH: TSH: 3.18 u[IU]/mL (ref 0.450–4.500)

## 2016-03-18 ENCOUNTER — Encounter: Payer: Self-pay | Admitting: Pain Medicine

## 2016-03-18 ENCOUNTER — Ambulatory Visit (HOSPITAL_BASED_OUTPATIENT_CLINIC_OR_DEPARTMENT_OTHER): Payer: Medicare Other | Admitting: Pain Medicine

## 2016-03-18 ENCOUNTER — Ambulatory Visit
Admission: RE | Admit: 2016-03-18 | Discharge: 2016-03-18 | Disposition: A | Discharge status: Home / Self Care | Payer: Medicare Other | Source: Ambulatory Visit | Attending: Pain Medicine | Admitting: Pain Medicine

## 2016-03-18 VITALS — BP 118/68 | HR 45 | Temp 97.6°F | Resp 12 | Ht 67.0 in | Wt 170.0 lb

## 2016-03-18 DIAGNOSIS — M48061 Spinal stenosis, lumbar region without neurogenic claudication: Secondary | ICD-10-CM | POA: Insufficient documentation

## 2016-03-18 DIAGNOSIS — Z951 Presence of aortocoronary bypass graft: Secondary | ICD-10-CM | POA: Diagnosis not present

## 2016-03-18 DIAGNOSIS — Z880 Allergy status to penicillin: Secondary | ICD-10-CM | POA: Insufficient documentation

## 2016-03-18 DIAGNOSIS — M545 Low back pain: Secondary | ICD-10-CM | POA: Insufficient documentation

## 2016-03-18 DIAGNOSIS — Z79899 Other long term (current) drug therapy: Secondary | ICD-10-CM | POA: Diagnosis not present

## 2016-03-18 DIAGNOSIS — G8929 Other chronic pain: Secondary | ICD-10-CM

## 2016-03-18 MED ORDER — TRIAMCINOLONE ACETONIDE 40 MG/ML IJ SUSP
INTRAMUSCULAR | Status: AC
  Administered 2016-03-18: 10:00:00
  Filled 2016-03-18: qty 1

## 2016-03-18 MED ORDER — MIDAZOLAM HCL 5 MG/5ML IJ SOLN: 1.0000 mg | INTRAMUSCULAR | Status: DC | PRN

## 2016-03-18 MED ORDER — MIDAZOLAM HCL 5 MG/5ML IJ SOLN
INTRAMUSCULAR | Status: AC
  Administered 2016-03-18: 1 mg via INTRAVENOUS
  Filled 2016-03-18: qty 5

## 2016-03-18 MED ORDER — SODIUM CHLORIDE 0.9 % IJ SOLN
INTRAMUSCULAR | Status: AC
  Administered 2016-03-18: 10:00:00
  Filled 2016-03-18: qty 10

## 2016-03-18 MED ORDER — ROPIVACAINE HCL 2 MG/ML IJ SOLN: 2.0000 mL | Freq: Once | INTRAMUSCULAR | Status: DC

## 2016-03-18 MED ORDER — SODIUM CHLORIDE 0.9% FLUSH: 2.0000 mL | Freq: Once | INTRAVENOUS | Status: DC

## 2016-03-18 MED ORDER — FENTANYL CITRATE (PF) 100 MCG/2ML IJ SOLN: 25.0000 ug | INTRAMUSCULAR | Status: DC | PRN

## 2016-03-18 MED ORDER — LIDOCAINE HCL (PF) 1 % IJ SOLN
INTRAMUSCULAR | Status: AC
  Administered 2016-03-18: 10:00:00
  Filled 2016-03-18: qty 5

## 2016-03-18 MED ORDER — TRIAMCINOLONE ACETONIDE 40 MG/ML IJ SUSP: 40.0000 mg | Freq: Once | INTRAMUSCULAR | Status: DC

## 2016-03-18 MED ORDER — IOPAMIDOL (ISOVUE-M 200) INJECTION 41%
INTRAMUSCULAR | Status: AC
  Administered 2016-03-18: 10:00:00
  Filled 2016-03-18: qty 10

## 2016-03-18 MED ORDER — IOPAMIDOL (ISOVUE-M 200) INJECTION 41%: 10.0000 mL | Freq: Once | INTRAMUSCULAR | Status: DC

## 2016-03-18 MED ORDER — LACTATED RINGERS IV SOLN: 1000.0000 mL | Freq: Once | INTRAVENOUS | Status: DC

## 2016-03-18 MED ORDER — LIDOCAINE HCL (PF) 1 % IJ SOLN: 10.0000 mL | Freq: Once | INTRAMUSCULAR | Status: DC

## 2016-03-18 MED ORDER — ROPIVACAINE HCL 2 MG/ML IJ SOLN
INTRAMUSCULAR | Status: AC
  Administered 2016-03-18: 10:00:00
  Filled 2016-03-18: qty 10

## 2016-03-18 MED ORDER — FENTANYL CITRATE (PF) 100 MCG/2ML IJ SOLN
INTRAMUSCULAR | Status: AC
Start: 2016-03-18 — End: 2016-03-18
  Administered 2016-03-18: 50 ug via INTRAVENOUS
  Filled 2016-03-18: qty 2

## 2016-03-18 NOTE — Patient Instructions (Signed)
Pain Management Discharge Instructions  General Discharge Instructions :  If you need to reach your doctor call: Monday-Friday 8:00 am - 4:00 pm at [phone] or toll free [phone].  After clinic hours [phone] to have operator reach doctor.  Bring all of your medication bottles to all your appointments in the pain clinic.  To cancel or reschedule your appointment with Pain Management please remember to call 24 hours in advance to avoid a fee.  Refer to the educational materials which you have been given on: General Risks, I had my Procedure. Discharge Instructions, Post Sedation.  Post Procedure Instructions:  The drugs you were given will stay in your system until tomorrow, so for the next 24 hours you should not drive, make any legal decisions or drink any alcoholic beverages.  You may eat anything you prefer, but it is better to start with liquids then soups and crackers, and gradually work up to solid foods.  Please notify your doctor immediately if you have any unusual bleeding, trouble breathing or pain that is not related to your normal pain.  Depending on the type of procedure that was done, some parts of your body may feel week and/or numb.  This usually clears up by tonight or the next day.  Walk with the use of an assistive device or accompanied by an adult for the 24 hours.  You may use ice on the affected area for the first 24 hours.  Put ice in a Ziploc bag and cover with a towel and place against area 15 minutes on 15 minutes off.  You may switch to heat after 24 hours.Epidural Steroid Injection An epidural steroid injection is given to relieve pain in your neck, back, or legs that is caused by the irritation or swelling of a nerve root. This procedure involves injecting a steroid and numbing medicine (anesthetic) into the epidural space. The epidural space is the space between the outer covering of your spinal cord and the bones that form your backbone  (vertebra).  LET YOUR HEALTH CARE PROVIDER KNOW ABOUT:   Any allergies you have.  All medicines you are taking, including vitamins, herbs, eye drops, creams, and over-the-counter medicines such as aspirin.  Previous problems you or members of your family have had with the use of anesthetics.  Any blood disorders or blood clotting disorders you have.  Previous surgeries you have had.  Medical conditions you have. RISKS AND COMPLICATIONS Generally, this is a safe procedure. However, as with any procedure, complications can occur. Possible complications of epidural steroid injection include:  Headache.  Bleeding.  Infection.  Allergic reaction to the medicines.  Damage to your nerves. The response to this procedure depends on the underlying cause of the pain and its duration. People who have long-term (chronic) pain are less likely to benefit from epidural steroids than are those people whose pain comes on strong and suddenly. BEFORE THE PROCEDURE   Ask your health care provider about changing or stopping your regular medicines. You may be advised to stop taking blood-thinning medicines a few days before the procedure.  You may be given medicines to reduce anxiety.  Arrange for someone to take you home after the procedure. PROCEDURE   You will remain awake during the procedure. You may receive medicine to make you relaxed.  You will be asked to lie on your stomach.  The injection site will be cleaned.  The injection site will be numbed with a medicine (local anesthetic).  A needle will be   injected through your skin into the epidural space.  Your health care provider will use an X-ray machine to ensure that the steroid is delivered closest to the affected nerve. You may have minimal discomfort at this time.  Once the needle is in the right position, the local anesthetic and the steroid will be injected into the epidural space.  The needle will then be removed and a  bandage will be applied to the injection site. AFTER THE PROCEDURE   You may be monitored for a short time before you go home.  You may feel weakness or numbness in your arm or leg, which disappears within hours.  You may be allowed to eat, drink, and take your regular medicine.  You may have soreness at the site of the injection.   This information is not intended to replace advice given to you by your health care provider. Make sure you discuss any questions you have with your health care provider.   Document Released: 08/26/2007 Document Revised: 01/19/2013 Document Reviewed: 11/05/2012 Elsevier Interactive Patient Education 2016 [employer]  

## 2016-03-18 NOTE — Progress Notes (Signed)
Patient's Name: Austin [name]  MRN: [id number]  Referring Provider: Jerrol Banana.,*  DOB: 1928-08-19  PCP: Wilhemena Durie, MD  DOS: 03/18/2016  Note by: Kathlen Brunswick. Dossie Arbour, MD  Service setting: Ambulatory outpatient  Location: [facility] (AMB) Pain Management Facility  Visit type: Procedure  Specialty: Interventional Pain Management  Patient type: Established   Primary Reason for Visit: Interventional Pain Management Treatment. CC: Back Pain (low, radiatiting to both gluteals- worse on the right)  Procedure:  Anesthesia, Analgesia, Anxiolysis:  Type: Therapeutic Inter-Laminar Epidural Steroid Injection Region: Lumbar Level: L4-5 Level. Laterality: Right Paramedial  Type: Local Anesthesia with Moderate (Conscious) Sedation Local Anesthetic: Lidocaine 1% Route: Intravenous (IV) IV Access: Secured Sedation: Meaningful verbal contact was maintained at all times during the procedure  Indication(s): Analgesia and Anxiety  Indications: 1. Chronic low back pain (Location of Primary Source of Pain) (Bilateral) (R>L)   2. Spinal stenosis of lumbar region without neurogenic claudication    Pain Score: Pre-procedure: 2 /10 Post-procedure: 0-No pain/10  Pre-Procedure Assessment:  Austin [name] is a 82 y.o. (year old), male patient, seen today for interventional treatment. He  has a past surgical history that includes Shoulder surgery (Right); Coronary artery bypass graft (2007); Cardiac catheterization (2007); Colonoscopy (N/A, 10/02/2014); Tonsillectomy; Hemorroidectomy; Carpal tunnel release (Bilateral); Cardiac catheterization (N/A, 04/17/2015); Lumbar epidural injection (2011); and Mouth surgery.. His primarily concern today is the Back Pain (low, radiatiting to both gluteals- worse on the right) The primary encounter diagnosis was Chronic low back pain (Location of Primary Source of Pain) (Bilateral) (R>L). A diagnosis of Spinal stenosis of lumbar region without neurogenic claudication was  also pertinent to this visit.  Pain Descriptors / Indicators: Austin [name] (going up stairs makes it much worse) Pain Frequency: Intermittent  Date of Last Visit: 02/18/16 Service Provided on Last Visit: Evaluation  Coagulation Parameters Lab Results  Component Value Date   PLT 159 02/27/2016   Verification of the correct person, correct site (including marking of site), and correct procedure were performed and confirmed by the patient.  Consent: Before the procedure and under the influence of no sedative(s), amnesic(s), or anxiolytics, the patient was informed of the treatment options, risks and possible complications. To fulfill our ethical and legal obligations, as recommended by the American Medical Association's Code of Ethics, I have informed the patient of my clinical impression; the nature and purpose of the treatment or procedure; the risks, benefits, and possible complications of the intervention; the alternatives, including doing nothing; the risk(s) and benefit(s) of the alternative treatment(s) or procedure(s); and the risk(s) and benefit(s) of doing nothing. The patient was provided information about the general risks and possible complications associated with the procedure. These may include, but are not limited to: failure to achieve desired goals, infection, bleeding, organ or nerve damage, allergic reactions, paralysis, and death. In addition, the patient was informed of those risks and complications associated to Spine-related procedures, such as failure to decrease pain; infection (i.e.: Meningitis, epidural or intraspinal abscess); bleeding (i.e.: epidural hematoma, subarachnoid hemorrhage, or any other type of intraspinal or peri-dural bleeding); organ or nerve damage (i.e.: Any type of peripheral nerve, nerve root, or spinal cord injury) with subsequent damage to sensory, motor, and/or autonomic systems, resulting in permanent pain, numbness, and/or weakness of one or several areas  of the body; allergic reactions; (i.e.: anaphylactic reaction); and/or death. Furthermore, the patient was informed of those risks and complications associated with the medications. These include, but are not limited to: allergic reactions (i.e.: anaphylactic  or anaphylactoid reaction(s)); adrenal axis suppression; blood sugar elevation that in diabetics may result in ketoacidosis or comma; water retention that in patients with history of congestive heart failure may result in shortness of breath, pulmonary edema, and decompensation with resultant heart failure; weight gain; swelling or edema; medication-induced neural toxicity; particulate matter embolism and blood vessel occlusion with resultant organ, and/or nervous system infarction; and/or aseptic necrosis of one or more joints. Finally, the patient was informed that Medicine is not an exact science; therefore, there is also the possibility of unforeseen or unpredictable risks and/or possible complications that may result in a catastrophic outcome. The patient indicated having understood very clearly. We have given the patient no guarantees and we have made no promises. Enough time was given to the patient to ask questions, all of which were answered to the patient's satisfaction. Austin [name] has indicated that he wanted to continue with the procedure.  Consent Attestation: I, the ordering provider, attest that I have discussed with the patient the benefits, risks, side-effects, alternatives, likelihood of achieving goals, and potential problems during recovery for the procedure that I have provided informed consent.  Pre-Procedure Preparation:  Safety Precautions: Allergies reviewed. The patient was asked about blood thinners, or active infections, both of which were denied. The patient was asked to confirm the procedure and laterality, before marking the site, and again before commencing the procedure. Appropriate site, procedure, and patient were  confirmed by following the Joint Commission's Universal Protocol (UP.01.01.01), in the form of a "Time Out". The patient was asked to participate by confirming the accuracy of the "Time Out" information. Patient was assessed for positional comfort and pressure points before starting the procedure. Allergies: He is allergic to penicillins.. Allergy Precautions: None required Infection Control Precautions: Sterile technique used. Standard Universal Precautions were taken as recommended by the Department of [facility] for Disease Control and Prevention (CDC). Standard pre-surgical skin prep was conducted. Respiratory hygiene and cough etiquette was practiced. Hand hygiene observed. Safe injection practices and needle disposal techniques followed. SDV (single dose vial) medications used. Medications properly checked for expiration dates and contaminants. Personal protective equipment (PPE) used as per protocol. Monitoring:  As per clinic protocol. Vitals:   03/18/16 1012 03/18/16 1021 03/18/16 1030 03/18/16 1040  BP: (!) 143/64 (!) 120/55 98/82 118/68  Pulse: (!) 50 (!) 47 (!) 49 (!) 45  Resp: 15 16 14 12   Temp:  98.6 F (37 C) 98.1 F (36.7 C) 97.6 F (36.4 C)  TempSrc:      SpO2: 98% 99% 99% 99%  Weight:      Height:      Calculated BMI: Body mass index is 26.63 kg/m. Time-out: "Time-out" completed before starting procedure, as per protocol.  Description of Procedure Process:   Position: Prone with head of the table was raised to facilitate breathing. Target Area: For Epidural Steroid injections the target is the interlaminar space, initially targeting the lower border of the superior vertebral body lamina. Approach: Paramedial approach. Area Prepped: Entire Posterior Lumbar Region Prepping solution: ChloraPrep (2% chlorhexidine gluconate and 70% isopropyl alcohol) Safety Precautions: Aspiration looking for blood return was conducted prior to all injections. At no point did we  inject any substances, as a needle was being advanced. No attempts were made at seeking any paresthesias. Safe injection practices and needle disposal techniques used. Medications properly checked for expiration dates. SDV (single dose vial) medications used. Description of the Procedure: Protocol guidelines were followed. The procedure needle was introduced through  the skin, ipsilateral to the reported pain, and advanced to the target area. Bone was contacted and the needle walked caudad, until the lamina was cleared. The epidural space was identified using "loss-of-resistance technique" with 2-3 ml of PF-NaCl (0.9% NSS), in a 5cc LOR glass syringe. EBL: None Materials & Medications:  Needle(s) Used: 20g - 10cm, Tuohy-style epidural needle Medication(s): see below. Imaging Guidance (Spinal):  Type of Imaging Technique: Fluoroscopy Guidance (Spinal) Indication(s): Assistance in needle guidance and placement for procedures requiring needle placement in or near specific anatomical locations not easily accessible without such assistance. Exposure Time: Please see nurses notes. Contrast: Before injecting any contrast, we confirmed that the patient did not have an allergy to iodine, shellfish, or radiological contrast. Once satisfactory needle placement was completed at the desired level, radiological contrast was injected. Contrast injected under live fluoroscopy. No contrast complications. See chart for type and volume of contrast used. Fluoroscopic Guidance: I was personally present during the use of fluoroscopy. "Tunnel Vision Technique" used to obtain the best possible view of the target area. Parallax error corrected before commencing the procedure. "Direction-depth-direction" technique used to introduce the needle under continuous pulsed fluoroscopy. Once target was reached, antero-posterior, oblique, and lateral fluoroscopic projection used confirm needle placement in all planes. Images permanently  stored in EMR. Interpretation: I personally interpreted the imaging intraoperatively. Adequate needle placement confirmed in multiple planes. Appropriate spread of contrast into desired area was observed. No evidence of afferent or efferent intravascular uptake. No intrathecal or subarachnoid spread observed. Permanent images saved into the patient's record.  Antibiotic Prophylaxis:  Indication(s): No indications identified. Type:  Antibiotics Given (last 72 hours)    None      Post-operative Assessment:  Complications: No immediate post-treatment complications observed by team, or reported by patient. Disposition: The patient tolerated the entire procedure well. A repeat set of vitals were taken after the procedure and the patient was kept under observation following institutional policy, for this type of procedure. Post-procedural neurological assessment was performed, showing return to baseline, prior to discharge. The patient was provided with post-procedure discharge instructions, including a section on how to identify potential problems. Should any problems arise concerning this procedure, the patient was given instructions to immediately contact us, at any time, without hesitation. In any case, we plan to contact the patient by telephone for a follow-up status report regarding this interventional procedure. Comments:  No additional relevant information.  Plan of Care  Discharge to: Discharge home  Medications ordered for procedure: Meds ordered this encounter  Medications  . fentaNYL (SUBLIMAZE) 100 MCG/2ML injection    Sharlett Iles, Delores: cabinet override  . midazolam (VERSED) 5 MG/5ML injection    Sharlett Iles, Delores: cabinet override  . ropivacaine (PF) 2 mg/ml (0.2%) (NAROPIN) 2 MG/ML epidural    Sharlett Iles, Delores: cabinet override  . iopamidol (ISOVUE-M) 41 % intrathecal injection    Sharlett Iles, Delores: cabinet override  . lidocaine (PF) (XYLOCAINE) 1 % injection     Sharlett Iles, Delores: cabinet override  . sodium chloride 0.9 % injection    Sharlett Iles, Delores: cabinet override  . triamcinolone acetonide (KENALOG-40) 40 MG/ML injection    Sharlett Iles, Delores: cabinet override  . fentaNYL (SUBLIMAZE) injection 25-50 mcg    Make sure Narcan is available in the pyxis when using this medication. In the event of respiratory depression (RR< 8/min): Titrate NARCAN (naloxone) in increments of 0.1 to 0.2 mg IV at 2-3 minute intervals, until desired degree of reversal.  . lactated ringers infusion 1,000 mL  .  midazolam (VERSED) 5 MG/5ML injection 1-2 mg    Make sure Flumazenil is available in the pyxis when using this medication. If oversedation occurs, administer 0.2 mg IV over 15 sec. If after 45 sec no response, administer 0.2 mg again over 1 min; may repeat at 1 min intervals; not to exceed 4 doses (1 mg)  . iopamidol (ISOVUE-M) 41 % intrathecal injection 10 mL  . triamcinolone acetonide (KENALOG-40) injection 40 mg  . lidocaine (PF) (XYLOCAINE) 1 % injection 10 mL  . sodium chloride flush (NS) 0.9 % injection 2 mL  . ropivacaine (PF) 2 mg/ml (0.2%) (NAROPIN) epidural 2 mL   Medications administered: (For more details, see medical record) We administered fentaNYL, midazolam, ropivacaine (PF) 2 mg/ml (0.2%), iopamidol, lidocaine (PF), sodium chloride, and triamcinolone acetonide.  Imaging Ordered: No results found for this or any previous visit. New Prescriptions   No medications on file   Physician-requested Follow-up:  Return in about 2 weeks (around 04/01/2016) for Post-Procedure evaluation.  Future Appointments Date Time Provider [place]  04/14/2016 1:30 PM Milinda Pointer, MD [facility] None  09/29/2016 8:00 AM Richard Maceo Pro., MD [facility] None   Primary Care Physician: Wilhemena Durie, MD Location: [facility] Note by: Kathlen Brunswick. Dossie Arbour, M.D, DABA, DABAPM, DABPM, DABIPP, FIPP  Disclaimer:   Medicine is not an exact science. The only guarantee in medicine is that nothing is guaranteed. It is important to note that the decision to proceed with this intervention was based on the information collected from the patient. The Data and conclusions were drawn from the patient's questionnaire, the interview, and the physical examination. Because the information was provided in large part by the patient, it cannot be guaranteed that it has not been purposely or unconsciously manipulated. Every effort has been made to obtain as much relevant data as possible for this evaluation. It is important to note that the conclusions that lead to this procedure are derived in large part from the available data. Always take into account that the treatment will also be dependent on availability of resources and existing treatment guidelines, considered by other Pain Management Practitioners as being common knowledge and practice, at the time of the intervention. For Medico-Legal purposes, it is also important to point out that variation in procedural techniques and pharmacological choices are the acceptable norm. The indications, contraindications, technique, and results of the above procedure should only be interpreted and judged by a Board-Certified Interventional Pain Specialist with extensive familiarity and expertise in the same exact procedure and technique. Attempts at providing opinions without similar or greater experience and expertise than that of the treating physician will be considered as inappropriate and unethical, and shall result in a formal complaint to the state medical board and applicable specialty societies.  Instructions provided at this appointment: Patient Instructions  Pain Management Discharge Instructions  General Discharge Instructions :  If you need to reach your doctor call: Monday-Friday 8:00 am - 4:00 pm at [phone] or toll free [phone].  After clinic hours [phone] to  have operator reach doctor.  Bring all of your medication bottles to all your appointments in the pain clinic.  To cancel or reschedule your appointment with Pain Management please remember to call 24 hours in advance to avoid a fee.  Refer to the educational materials which you have been given on: General Risks, I had my Procedure. Discharge Instructions, Post Sedation.  Post Procedure Instructions:  The drugs you were given will stay in your  system until tomorrow, so for the next 24 hours you should not drive, make any legal decisions or drink any alcoholic beverages.  You may eat anything you prefer, but it is better to start with liquids then soups and crackers, and gradually work up to solid foods.  Please notify your doctor immediately if you have any unusual bleeding, trouble breathing or pain that is not related to your normal pain.  Depending on the type of procedure that was done, some parts of your body may feel week and/or numb.  This usually clears up by tonight or the next day.  Walk with the use of an assistive device or accompanied by an adult for the 24 hours.  You may use ice on the affected area for the first 24 hours.  Put ice in a Ziploc bag and cover with a towel and place against area 15 minutes on 15 minutes off.  You may switch to heat after 24 hours.Epidural Steroid Injection An epidural steroid injection is given to relieve pain in your neck, back, or legs that is caused by the irritation or swelling of a nerve root. This procedure involves injecting a steroid and numbing medicine (anesthetic) into the epidural space. The epidural space is the space between the outer covering of your spinal cord and the bones that form your backbone (vertebra).  LET [facility] CARE PROVIDER KNOW ABOUT:   Any allergies you have.  All medicines you are taking, including vitamins, herbs, eye drops, creams, and over-the-counter medicines such as aspirin.  Previous problems you or  members of your family have had with the use of anesthetics.  Any blood disorders or blood clotting disorders you have.  Previous surgeries you have had.  Medical conditions you have. RISKS AND COMPLICATIONS Generally, this is a safe procedure. However, as with any procedure, complications can occur. Possible complications of epidural steroid injection include:  Headache.  Bleeding.  Infection.  Allergic reaction to the medicines.  Damage to your nerves. The response to this procedure depends on the underlying cause of the pain and its duration. People who have long-term (chronic) pain are less likely to benefit from epidural steroids than are those people whose pain comes on strong and suddenly. BEFORE THE PROCEDURE   Ask your health care provider about changing or stopping your regular medicines. You may be advised to stop taking blood-thinning medicines a few days before the procedure.  You may be given medicines to reduce anxiety.  Arrange for someone to take you home after the procedure. PROCEDURE   You will remain awake during the procedure. You may receive medicine to make you relaxed.  You will be asked to lie on your stomach.  The injection site will be cleaned.  The injection site will be numbed with a medicine (local anesthetic).  A needle will be injected through your skin into the epidural space.  Your health care provider will use an X-ray machine to ensure that the steroid is delivered closest to the affected nerve. You may have minimal discomfort at this time.  Once the needle is in the right position, the local anesthetic and the steroid will be injected into the epidural space.  The needle will then be removed and a bandage will be applied to the injection site. AFTER THE PROCEDURE   You may be monitored for a short time before you go home.  You may feel weakness or numbness in your arm or leg, which disappears within hours.  You may  be allowed to  eat, drink, and take your regular medicine.  You may have soreness at the site of the injection.   This information is not intended to replace advice given to you by your health care provider. Make sure you discuss any questions you have with your health care provider.   Document Released: 08/26/2007 Document Revised: 01/19/2013 Document Reviewed: 11/05/2012 Elsevier Interactive Patient Education [employer].

## 2016-03-19 ENCOUNTER — Telehealth: Payer: Self-pay

## 2016-03-19 NOTE — Telephone Encounter (Signed)
Post procedure call, left message for patient to call back if any complications.

## 2016-04-14 ENCOUNTER — Ambulatory Visit: Payer: Medicare Other | Attending: Pain Medicine | Admitting: Pain Medicine

## 2016-04-14 ENCOUNTER — Encounter: Payer: Self-pay | Admitting: Pain Medicine

## 2016-04-14 VITALS — BP 135/53 | HR 65 | Temp 98.5°F | Resp 14 | Ht 67.0 in | Wt 165.0 lb

## 2016-04-14 DIAGNOSIS — Z9889 Other specified postprocedural states: Secondary | ICD-10-CM | POA: Diagnosis not present

## 2016-04-14 DIAGNOSIS — I10 Essential (primary) hypertension: Secondary | ICD-10-CM | POA: Insufficient documentation

## 2016-04-14 DIAGNOSIS — I255 Ischemic cardiomyopathy: Secondary | ICD-10-CM | POA: Diagnosis not present

## 2016-04-14 DIAGNOSIS — M545 Low back pain: Secondary | ICD-10-CM | POA: Diagnosis not present

## 2016-04-14 DIAGNOSIS — E785 Hyperlipidemia, unspecified: Secondary | ICD-10-CM | POA: Diagnosis not present

## 2016-04-14 DIAGNOSIS — M48062 Spinal stenosis, lumbar region with neurogenic claudication: Secondary | ICD-10-CM

## 2016-04-14 DIAGNOSIS — D649 Anemia, unspecified: Secondary | ICD-10-CM | POA: Diagnosis not present

## 2016-04-14 DIAGNOSIS — H269 Unspecified cataract: Secondary | ICD-10-CM | POA: Diagnosis not present

## 2016-04-14 DIAGNOSIS — H409 Unspecified glaucoma: Secondary | ICD-10-CM | POA: Insufficient documentation

## 2016-04-14 DIAGNOSIS — M48061 Spinal stenosis, lumbar region without neurogenic claudication: Secondary | ICD-10-CM

## 2016-04-14 DIAGNOSIS — M1288 Other specific arthropathies, not elsewhere classified, other specified site: Secondary | ICD-10-CM | POA: Diagnosis not present

## 2016-04-14 DIAGNOSIS — Z87891 Personal history of nicotine dependence: Secondary | ICD-10-CM | POA: Diagnosis not present

## 2016-04-14 DIAGNOSIS — I25110 Atherosclerotic heart disease of native coronary artery with unstable angina pectoris: Secondary | ICD-10-CM | POA: Insufficient documentation

## 2016-04-14 DIAGNOSIS — M9983 Other biomechanical lesions of lumbar region: Secondary | ICD-10-CM | POA: Diagnosis not present

## 2016-04-14 DIAGNOSIS — I252 Old myocardial infarction: Secondary | ICD-10-CM | POA: Insufficient documentation

## 2016-04-14 DIAGNOSIS — Z951 Presence of aortocoronary bypass graft: Secondary | ICD-10-CM | POA: Insufficient documentation

## 2016-04-14 DIAGNOSIS — K222 Esophageal obstruction: Secondary | ICD-10-CM | POA: Insufficient documentation

## 2016-04-14 DIAGNOSIS — Z79899 Other long term (current) drug therapy: Secondary | ICD-10-CM | POA: Insufficient documentation

## 2016-04-14 DIAGNOSIS — R7303 Prediabetes: Secondary | ICD-10-CM | POA: Insufficient documentation

## 2016-04-14 DIAGNOSIS — Z86010 Personal history of colonic polyps: Secondary | ICD-10-CM | POA: Insufficient documentation

## 2016-04-14 DIAGNOSIS — Z7982 Long term (current) use of aspirin: Secondary | ICD-10-CM | POA: Insufficient documentation

## 2016-04-14 DIAGNOSIS — I739 Peripheral vascular disease, unspecified: Secondary | ICD-10-CM | POA: Insufficient documentation

## 2016-04-14 DIAGNOSIS — Z87442 Personal history of urinary calculi: Secondary | ICD-10-CM | POA: Diagnosis not present

## 2016-04-14 DIAGNOSIS — K219 Gastro-esophageal reflux disease without esophagitis: Secondary | ICD-10-CM | POA: Diagnosis not present

## 2016-04-14 DIAGNOSIS — M47816 Spondylosis without myelopathy or radiculopathy, lumbar region: Secondary | ICD-10-CM

## 2016-04-14 NOTE — Progress Notes (Signed)
Patient's Name: Austin [name]  MRN: [id number]  Referring Provider: Jerrol Banana.,*  DOB: [birth year]  PCP: Jerrol Banana., MD  DOS: 04/14/2016  Note by: Kathlen Brunswick. Dossie Arbour, MD  Service setting: Ambulatory outpatient  Specialty: Interventional Pain Management  Location: [facility] (AMB) Pain Management Facility    Patient type: Established   Primary Reason(s) for Visit: Encounter for post-procedure evaluation of chronic illness with mild to moderate exacerbation CC: Back Pain ( lower denies any pain today) and Rectal Pain (both )  HPI  Austin [name] is a 82 y.o. year old, male patient, who comes today for a post-procedure evaluation. He has History of colonic polyps; Absolute anemia; Benign essential HTN; Benign fibroma of prostate; Atherosclerosis of coronary artery; Cataract; Esophageal stenosis; Acid reflux; Glaucoma; Hemorrhoid; HLD (hyperlipidemia); Calculus of kidney; Obstructive apnea; Borderline diabetes; Peripheral blood vessel disorder ([place]); Esophagogastric ring; Heart valve disease; MI (mitral incompetence); Cardiomyopathy, ischemic; Coronary artery disease involving native coronary artery of native heart with unstable angina pectoris ([place]); Bradycardia; Chronic low back pain (Location of Primary Source of Pain) (Bilateral) (R>L); Lumbar spondylosis; Lumbar facet syndrome (Bilateral) (R>L); Peripheral vascular disease ([place]); Lumbar central spinal stenosis (severe at L4-5); Lumbar facet hypertrophy (multilevel); Grade 1 Anterolisthesis of L3 over L4; Lumbar foraminal stenosis (Right L2-3; Bilateral L3-4, L4-5); Chronic pain; and Hyperglycemia on his problem list. His primarily concern today is the Back Pain ( lower denies any pain today) and Rectal Pain (both )  Pain Assessment: Self-Reported Pain Score: 0-No pain/10             Reported level is compatible with observation.       Pain Type: Chronic pain Pain Location: Back Pain Orientation: Lower Pain Frequency:  Intermittent  Austin [name] comes in today for post-procedure evaluation after the treatment done on 03/18/2016.  Further details on both, my assessment(s), as well as the proposed treatment plan, please see below.  Post-Procedure Assessment  03/18/2016 Procedure: Diagnostic right-sided L4-5 lumbar epidural steroid injection under fluoroscopic guidance and IV sedation. Influential Factors: BMI: 25.84 kg/m Intra-procedural challenges: None observed Assessment challenges: None detected         Post-procedural side-effects, adverse reactions, or complications: None reported Reported issues: None  Sedation: Sedation provided. When no sedatives are used, the analgesic levels obtained are directly associated to the effectiveness of the local anesthetics. However, when sedation is provided, the level of analgesia obtained during the initial 1 hour following the intervention, is believed to be the result of a combination of factors. These factors may include, but are not limited to: 1. The effectiveness of the local anesthetics used. 2. The effects of the analgesic(s) and/or anxiolytic(s) used. 3. The degree of discomfort experienced by the patient at the time of the procedure. 4. The patients ability and reliability in recalling and recording the events. 5. The presence and influence of possible secondary gains and/or psychosocial factors. Reported result: Relief experienced during the 1st hour after the procedure: 100 % (Ultra-Short Term Relief) Interpretative annotation: Analgesia during this period is likely to be Local Anesthetic and/or IV Sedative (Analgesic/Anxiolitic) related.          Effects of local anesthetic: The analgesic effects attained during this period are directly associated to the localized infiltration of local anesthetics and therefore cary significant diagnostic value as to the etiological location, or anatomical origin, of the pain. Expected duration of relief is directly  dependent on the pharmacodynamics of the local anesthetic used. Long-acting (4-6 hours) anesthetics used.  Reported result: Relief during the next 4 to 6 hour after the procedure: 90 % (Short-Term Relief) Interpretative annotation: Complete relief would suggest area to be the source of the pain.          Long-term benefit: Defined as the period of time past the expected duration of local anesthetics. With the possible exception of prolonged sympathetic blockade from the local anesthetics, benefits during this period are typically attributed to, or associated with, other factors such as analgesic sensory neuropraxia, antiinflammatory effects, or beneficial biochemical changes provided by agents other than the local anesthetics Reported result: Extended relief following procedure: 100 % (still having relief ) (Long-Term Relief) Interpretative annotation: Good relief. This could suggest inflammation to be a significant component in the etiology to the pain.          Current benefits: Defined as persistent relief that continues at this point in time.   Reported results: Treated area: 100 % In addition, the patient reports improvement in function Interpretative annotation: Ongoing benefits would suggest effective therapeutic approach  Interpretation: Results would suggest a successful diagnostic intervention.          Laboratory Chemistry  Inflammation Markers No results found for: ESRSEDRATE, CRP Renal Function Lab Results  Component Value Date   BUN 25 02/27/2016   CREATININE 1.61 (H) 02/27/2016   GFRAA 44 (L) 02/27/2016   GFRNONAA 38 (L) 02/27/2016   Hepatic Function Lab Results  Component Value Date   AST 21 02/27/2016   ALT 29 02/27/2016   ALBUMIN 4.4 02/27/2016   Electrolytes Lab Results  Component Value Date   NA 139 02/27/2016   K 4.4 02/27/2016   CL 100 02/27/2016   CALCIUM 9.3 02/27/2016   Pain Modulating Vitamins No results found for: Maralyn Sago [id number], [id number],  [id number], 25OHVITD1, 25OHVITD2, 25OHVITD3, VITAMINB12 Coagulation Parameters Lab Results  Component Value Date   PLT 159 02/27/2016   Cardiovascular Lab Results  Component Value Date   HGB 14.6 05/09/2013   HCT 42.3 02/27/2016   Note: Lab results reviewed.  Recent Diagnostic Imaging Review  Dg C-arm 1-60 Min-no Report  Result Date: 03/18/2016 CLINICAL DATA: Assistance in needle guidance and placement for procedures requiring needle placement in or near specific anatomical locations not easily accessible without such assistance. C-ARM 1-60 MINUTES Fluoroscopy was utilized by the requesting physician.  No radiographic interpretation.   Note: Imaging results reviewed.  Meds  The patient has a current medication list which includes the following prescription(s): amlodipine, aspirin, atorvastatin, biotin, vitamin d3, coenzyme q10, dorzolamide-timolol, glucosamine-chondroitin, isosorbide mononitrate, nitroglycerin, omega-3 fatty acids, omeprazole, tamsulosin, travoprost (bak free), and valsartan-hydrochlorothiazide.  Current Outpatient Prescriptions on File Prior to Visit  Medication Sig  . amLODipine (NORVASC) 5 MG tablet Take 5 mg by mouth daily.  Marland Kitchen aspirin 81 MG tablet Take by mouth daily.   Marland Kitchen atorvastatin (LIPITOR) 20 MG tablet Take by mouth daily.   . Biotin 5000 MCG CAPS Take by mouth daily.   . Cholecalciferol (VITAMIN D3) 2000 UNITS capsule Take 2,000 Units by mouth 2 (two) times daily.   Marland Kitchen COENZYME Q-10 PO Take 200 mg by mouth daily.   . dorzolamide-timolol (COSOPT) 22.3-6.8 MG/ML ophthalmic solution   . glucosamine-chondroitin 500-400 MG tablet Take 1 tablet by mouth 2 (two) times daily.  . isosorbide mononitrate (IMDUR) 30 MG 24 hr tablet Take 30 mg by mouth.   . nitroGLYCERIN (NITROSTAT) 0.4 MG SL tablet Place under the tongue every 5 (five) minutes as needed.   . OMEGA-3 FATTY ACIDS  PO Take 1,200 mcg by mouth 2 (two) times daily.   Marland Kitchen omeprazole (PRILOSEC) 20 MG capsule  daily. Reported on 08/21/2015  . tamsulosin (FLOMAX) 0.4 MG CAPS capsule TAKE 1 CAPSULE BY MOUTH EVERY DAY  . Travoprost, BAK Free, (TRAVATAN Z) 0.004 % SOLN ophthalmic solution Place 1 drop into both eyes at bedtime.   . valsartan-hydrochlorothiazide (DIOVAN HCT) 320-25 MG per tablet Take 1 tablet by mouth daily.    No current facility-administered medications on file prior to visit.    ROS  Constitutional: Denies any fever or chills Gastrointestinal: No reported hemesis, hematochezia, vomiting, or acute GI distress Musculoskeletal: Denies any acute onset joint swelling, redness, loss of ROM, or weakness Neurological: No reported episodes of acute onset apraxia, aphasia, dysarthria, agnosia, amnesia, paralysis, loss of coordination, or loss of consciousness  Allergies  Mr. Dilauro is allergic to penicillins.  PFSH  Drug: Mr. Saha  reports that he does not use drugs. Alcohol:  reports that he drinks alcohol. Tobacco:  reports that he has quit smoking. His smoking use included Cigarettes. He quit after 15.00 years of use. He has never used smokeless tobacco. Medical:  has a past medical history of Allergy; Arthritis; GERD (gastroesophageal reflux disease); Hypertension; and Myocardial infarction (2011). Family: family history includes Heart disease in his brother, brother, brother, brother, father, and mother.  Past Surgical History:  Procedure Laterality Date  . CARDIAC CATHETERIZATION  2007  . CARDIAC CATHETERIZATION N/A 04/17/2015   Procedure: Left Heart Cath and Coronary Angiography;  Surgeon: Corey Skains, MD;  Location: [place] [facility];  Service: Cardiovascular;  Laterality: N/A;  . CARPAL TUNNEL RELEASE Bilateral    right 06/09/08 left 05/03/09  . COLONOSCOPY N/A 10/02/2014   Procedure: COLONOSCOPY;  Surgeon: Manya Silvas, MD;  Location: [facility] ENDOSCOPY;  Service: Endoscopy;  Laterality: N/A;  . CORONARY ARTERY BYPASS GRAFT  2007  . HEMORROIDECTOMY    . LUMBAR EPIDURAL  INJECTION  2011  . MOUTH SURGERY    . SHOULDER SURGERY Right   . TONSILLECTOMY     Constitutional Exam  General appearance: Well nourished, well developed, and well hydrated. In no apparent acute distress Vitals:   04/14/16 1308  BP: (!) 135/53  Pulse: 65  Resp: 14  Temp: 98.5 F (36.9 C)  TempSrc: Oral  SpO2: 100%  Weight: 165 lb (74.8 kg)  Height: 5\' 7"  (1.702 m)   BMI Assessment: Estimated body mass index is 25.84 kg/m as calculated from the following:   Height as of this encounter: 5\' 7"  (1.702 m).   Weight as of this encounter: 165 lb (74.8 kg).  BMI interpretation table: BMI level Category Range association with higher incidence of chronic pain  <18 kg/m2 Underweight   18.5-24.9 kg/m2 Ideal body weight   25-29.9 kg/m2 Overweight Increased incidence by 20%  30-34.9 kg/m2 Obese (Class I) Increased incidence by 68%  35-39.9 kg/m2 Severe obesity (Class II) Increased incidence by 136%  >40 kg/m2 Extreme obesity (Class III) Increased incidence by 254%   BMI Readings from Last 4 Encounters:  04/14/16 25.84 kg/m  03/18/16 26.63 kg/m  02/27/16 26.94 kg/m  10/18/15 26.63 kg/m   Wt Readings from Last 4 Encounters:  04/14/16 165 lb (74.8 kg)  03/18/16 170 lb (77.1 kg)  02/27/16 172 lb (78 kg)  10/18/15 170 lb (77.1 kg)  Psych/Mental status: Alert, oriented x 3 (person, place, & time) Eyes: PERLA Respiratory: No evidence of acute respiratory distress  Cervical Spine Exam  Inspection: No masses,  redness, or swelling Alignment: Symmetrical Functional ROM: Unrestricted ROM Stability: No instability detected Muscle strength & Tone: Functionally intact Sensory: Unimpaired Palpation: Non-contributory  Upper Extremity (UE) Exam    Side: Right upper extremity  Side: Left upper extremity  Inspection: No masses, redness, swelling, or asymmetry  Inspection: No masses, redness, swelling, or asymmetry  Functional ROM: Unrestricted ROM         Functional ROM: Unrestricted  ROM          Muscle strength & Tone: Functionally intact  Muscle strength & Tone: Functionally intact  Sensory: Unimpaired  Sensory: Unimpaired  Palpation: Non-contributory  Palpation: Non-contributory   Thoracic Spine Exam  Inspection: No masses, redness, or swelling Alignment: Symmetrical Functional ROM: Unrestricted ROM Stability: No instability detected Sensory: Unimpaired Muscle strength & Tone: Functionally intact Palpation: Non-contributory  Lumbar Spine Exam  Inspection: No masses, redness, or swelling Alignment: Symmetrical Functional ROM: Unrestricted ROM Stability: No instability detected Muscle strength & Tone: Functionally intact Sensory: Unimpaired Palpation: Non-contributory Provocative Tests: Lumbar Hyperextension and rotation test: evaluation deferred today       Patrick's Maneuver: evaluation deferred today              Gait & Posture Assessment  Ambulation: Unassisted Gait: Relatively normal for age and body habitus Posture: WNL   Lower Extremity Exam    Side: Right lower extremity  Side: Left lower extremity  Inspection: No masses, redness, swelling, or asymmetry  Inspection: No masses, redness, swelling, or asymmetry  Functional ROM: Unrestricted ROM          Functional ROM: Unrestricted ROM          Muscle strength & Tone: Functionally intact  Muscle strength & Tone: Functionally intact  Sensory: Unimpaired  Sensory: Unimpaired  Palpation: Non-contributory  Palpation: Non-contributory   Assessment  Primary Diagnosis & Pertinent Problem List: The encounter diagnosis was Spinal stenosis of lumbar region with neurogenic claudication.  Visit Diagnosis: 1. Spinal stenosis of lumbar region with neurogenic claudication    Plan of Care  Pharmacotherapy (Medications Ordered): No orders of the defined types were placed in this encounter.  New Prescriptions   No medications on file   Medications administered today: Mr. Dakin had no medications  administered during this visit. Lab-work, procedure(s), and/or referral(s): Orders Placed This Encounter  Procedures  . Lumbar Epidural Injection   Imaging and/or referral(s): None  Interventional therapies: Planned, scheduled, and/or pending:   None at this time.    Considering:   Palliative right-sided L4-5 lumbar epidural steroid injection under fluoroscopic guidance, no sedation.  Palliative bilateral lumbar facet block under fluoroscopic guidance and IV sedation #3.  Diagnostic right-sided L2-3 transforaminal epidural steroid injection + bilateral L3-4 and/or L4-5 transforaminal epidural steroid injection under fluoroscopic guidance, with or without sedation, depending on the location of the pain at the time of the visit.    Palliative PRN treatment(s):   Palliative bilateral lumbar facet block under fluoroscopic guidance and IV sedation #3.  Palliative right-sided L4-5 lumbar epidural steroid injection under fluoroscopic guidance, no sedation.  Diagnostic right-sided L2-3 transforaminal epidural steroid injection + bilateral L3-4 and/or L4-5 transforaminal epidural steroid injection under fluoroscopic guidance, with or without sedation, depending on the location of the pain at the time of the visit.    Provider-requested follow-up: Return if symptoms worsen or fail to improve, for procedure, (PRN) procedure.  Future Appointments Date Time Provider [place]  09/29/2016 8:00 AM Richard Maceo Pro., MD [facility] None  11/07/2016 8:00 AM [facility]  2 [facility] None  11/07/2016 9:00 AM Algernon Huxley, MD [facility] None   Primary Care Physician: Jerrol Banana., MD Location: [facility] Outpatient Pain Management Facility Note by: Kathlen Brunswick. Dossie Arbour, M.D, DABA, DABAPM, DABPM, DABIPP, FIPP  Pain Score Disclaimer: We use the NRS-11 scale. This is a self-reported, subjective measurement of pain severity with only modest accuracy. It is used primarily to identify changes within a  particular patient. It must be understood that outpatient pain scales are significantly less accurate that those used for research, where they can be applied under ideal controlled circumstances with minimal exposure to variables. In reality, the score is likely to be a combination of pain intensity and pain affect, where pain affect describes the degree of emotional arousal or changes in action readiness caused by the sensory experience of pain. Factors such as social and work situation, setting, emotional state, anxiety levels, expectation, and prior pain experience may influence pain perception and show large inter-individual differences that may also be affected by time variables.  Patient instructions provided during this appointment: Patient Instructions   GENERAL RISKS AND COMPLICATIONS  What are the risk, side effects and possible complications? Generally speaking, most procedures are safe.  However, with any procedure there are risks, side effects, and the possibility of complications.  The risks and complications are dependent upon the sites that are lesioned, or the type of nerve block to be performed.  The closer the procedure is to the spine, the more serious the risks are.  Great care is taken when placing the radio frequency needles, block needles or lesioning probes, but sometimes complications can occur. 1. Infection: Any time there is an injection through the skin, there is a risk of infection.  This is why sterile conditions are used for these blocks.  There are four possible types of infection. 1. Localized skin infection. 2. Central Nervous System Infection-This can be in the form of Meningitis, which can be deadly. 3. Epidural Infections-This can be in the form of an epidural abscess, which can cause pressure inside of the spine, causing compression of the spinal cord with subsequent paralysis. This would require an emergency surgery to decompress, and there are no guarantees that  the patient would recover from the paralysis. 4. Discitis-This is an infection of the intervertebral discs.  It occurs in about 1% of discography procedures.  It is difficult to treat and it may lead to surgery.        2. Pain: the needles have to go through skin and soft tissues, will cause soreness.       3. Damage to internal structures:  The nerves to be lesioned may be near blood vessels or    other nerves which can be potentially damaged.       4. Bleeding: Bleeding is more common if the patient is taking blood thinners such as  aspirin, Coumadin, Ticiid, Plavix, etc., or if he/she have some genetic predisposition  such as hemophilia. Bleeding into the spinal canal can cause compression of the spinal  cord with subsequent paralysis.  This would require an emergency surgery to  decompress and there are no guarantees that the patient would recover from the  paralysis.       5. Pneumothorax:  Puncturing of a lung is a possibility, every time a needle is introduced in  the area of the chest or upper back.  Pneumothorax refers to free air around the  collapsed lung(s), inside of the thoracic cavity (chest cavity).  Another two possible  complications related to a similar event would include: Hemothorax and Chylothorax.   These are variations of the Pneumothorax, where instead of air around the collapsed  lung(s), you may have blood or chyle, respectively.       6. Spinal headaches: They may occur with any procedures in the area of the spine.       7. Persistent CSF (Cerebro-Spinal Fluid) leakage: This is a rare problem, but may occur  with prolonged intrathecal or epidural catheters either due to the formation of a fistulous  track or a dural tear.       8. Nerve damage: By working so close to the spinal cord, there is always a possibility of  nerve damage, which could be as serious as a permanent spinal cord injury with  paralysis.       9. Death:  Although rare, severe deadly allergic reactions known  as "Anaphylactic  reaction" can occur to any of the medications used.      10. Worsening of the symptoms:  We can always make thing worse.  What are the chances of something like this happening? Chances of any of this occuring are extremely low.  By statistics, you have more of a chance of getting killed in a motor vehicle accident: while driving to the hospital than any of the above occurring .  Nevertheless, you should be aware that they are possibilities.  In general, it is similar to taking a shower.  Everybody knows that you can slip, hit your head and get killed.  Does that mean that you should not shower again?  Nevertheless always keep in mind that statistics do not mean anything if you happen to be on the wrong side of them.  Even if a procedure has a 1 (one) in a 1,000,000 (million) chance of going wrong, it you happen to be that one..Also, keep in mind that by statistics, you have more of a chance of having something go wrong when taking medications.  Who should not have this procedure? If you are on a blood thinning medication (e.g. Coumadin, Plavix, see list of "Blood Thinners"), or if you have an active infection going on, you should not have the procedure.  If you are taking any blood thinners, please inform your physician.  How should I prepare for this procedure?  Do not eat or drink anything at least six hours prior to the procedure.  Bring a driver with you .  It cannot be a taxi.  Come accompanied by an adult that can drive you back, and that is strong enough to help you if your legs get weak or numb from the local anesthetic.  Take all of your medicines the morning of the procedure with just enough water to swallow them.  If you have diabetes, make sure that you are scheduled to have your procedure done first thing in the morning, whenever possible.  If you have diabetes, take only half of your insulin dose and notify our nurse that you have done so as soon as you arrive at  the clinic.  If you are diabetic, but only take blood sugar pills (oral hypoglycemic), then do not take them on the morning of your procedure.  You may take them after you have had the procedure.  Do not take aspirin or any aspirin-containing medications, at least eleven (11) days prior to the procedure.  They may prolong bleeding.  Wear loose fitting clothing that may be easy to  take off and that you would not mind if it got stained with Betadine or blood.  Do not wear any jewelry or perfume  Remove any nail coloring.  It will interfere with some of our monitoring equipment.  NOTE: Remember that this is not meant to be interpreted as a complete list of all possible complications.  Unforeseen problems may occur.  BLOOD THINNERS The following drugs contain aspirin or other products, which can cause increased bleeding during surgery and should not be taken for 2 weeks prior to and 1 week after surgery.  If you should need take something for relief of minor pain, you may take acetaminophen which is found in Tylenol,m Datril, Anacin-3 and Panadol. It is not blood thinner. The products listed below are.  Do not take any of the products listed below in addition to any listed on your instruction sheet.  A.P.C or A.P.C with Codeine Codeine Phosphate Capsules #3 Ibuprofen Ridaura  ABC compound Congesprin Imuran rimadil  Advil Cope Indocin Robaxisal  Alka-Seltzer Effervescent Pain Reliever and Antacid Coricidin or Coricidin-D  Indomethacin Rufen  Alka-Seltzer plus Cold Medicine Cosprin Ketoprofen S-A-C Tablets  Anacin Analgesic Tablets or Capsules Coumadin Korlgesic Salflex  Anacin Extra Strength Analgesic tablets or capsules CP-2 Tablets Lanoril Salicylate  Anaprox Cuprimine Capsules Levenox Salocol  Anexsia-D Dalteparin Magan Salsalate  Anodynos Darvon compound Magnesium Salicylate Sine-off  Ansaid Dasin Capsules Magsal Sodium Salicylate  Anturane Depen Capsules Marnal Soma  APF Arthritis pain  formula Dewitt's Pills Measurin Stanback  Argesic Dia-Gesic Meclofenamic Sulfinpyrazone  Arthritis Bayer Timed Release Aspirin Diclofenac Meclomen Sulindac  Arthritis pain formula Anacin Dicumarol Medipren Supac  Analgesic (Safety coated) Arthralgen Diffunasal Mefanamic Suprofen  Arthritis Strength Bufferin Dihydrocodeine Mepro Compound Suprol  Arthropan liquid Dopirydamole Methcarbomol with Aspirin Synalgos  ASA tablets/Enseals Disalcid Micrainin Tagament  Ascriptin Doan's Midol Talwin  Ascriptin A/D Dolene Mobidin Tanderil  Ascriptin Extra Strength Dolobid Moblgesic Ticlid  Ascriptin with Codeine Doloprin or Doloprin with Codeine Momentum Tolectin  Asperbuf Duoprin Mono-gesic Trendar  Aspergum Duradyne Motrin or Motrin IB Triminicin  Aspirin plain, buffered or enteric coated Durasal Myochrisine Trigesic  Aspirin Suppositories Easprin Nalfon Trillsate  Aspirin with Codeine Ecotrin Regular or Extra Strength Naprosyn Uracel  Atromid-S Efficin Naproxen Ursinus  Auranofin Capsules Elmiron Neocylate Vanquish  Axotal Emagrin Norgesic Verin  Azathioprine Empirin or Empirin with Codeine Normiflo Vitamin E  Azolid Emprazil Nuprin Voltaren  Bayer Aspirin plain, buffered or children's or timed BC Tablets or powders Encaprin Orgaran Warfarin Sodium  Buff-a-Comp Enoxaparin Orudis Zorpin  Buff-a-Comp with Codeine Equegesic Os-Cal-Gesic   Buffaprin Excedrin plain, buffered or Extra Strength Oxalid   Bufferin Arthritis Strength Feldene Oxphenbutazone   Bufferin plain or Extra Strength Feldene Capsules Oxycodone with Aspirin   Bufferin with Codeine Fenoprofen Fenoprofen Pabalate or Pabalate-SF   Buffets II Flogesic Panagesic   Buffinol plain or Extra Strength Florinal or Florinal with Codeine Panwarfarin   Buf-Tabs Flurbiprofen Penicillamine   Butalbital Compound Four-way cold tablets Penicillin   Butazolidin Fragmin Pepto-Bismol   Carbenicillin Geminisyn Percodan   Carna Arthritis Reliever Geopen  Persantine   Carprofen Gold's salt Persistin   Chloramphenicol Goody's Phenylbutazone   Chloromycetin Haltrain Piroxlcam   Clmetidine heparin Plaquenil   Cllnoril Hyco-pap Ponstel   Clofibrate Hydroxy chloroquine Propoxyphen         Before stopping any of these medications, be sure to consult the physician who ordered them.  Some, such as Coumadin (Warfarin) are ordered to prevent or treat serious conditions such as "deep thrombosis", "pumonary  embolisms", and other heart problems.  The amount of time that you may need off of the medication may also vary with the medication and the reason for which you were taking it.  If you are taking any of these medications, please make sure you notify your pain physician before you undergo any procedures.         Facet Joint Block The facet joints connect the bones of the spine (vertebrae). They make it possible for you to bend, twist, and make other movements with your spine. They also prevent you from overbending, overtwisting, and making other excessive movements.  A facet joint block is a procedure where a numbing medicine (anesthetic) is injected into a facet joint. Often, a type of anti-inflammatory medicine called a steroid is also injected. A facet joint block may be done for two reasons:   Diagnosis. A facet joint block may be done as a test to see whether neck or back pain is caused by a worn-down or infected facet joint. If the pain gets better after a facet joint block, it means the pain is probably coming from the facet joint. If the pain does not get better, it means the pain is probably not coming from the facet joint.   Therapy. A facet joint block may be done to relieve neck or back pain caused by a facet joint. A facet joint block is only done as a therapy if the pain does not improve with medicine, exercise programs, physical therapy, and other forms of pain management. LET [facility] CARE PROVIDER KNOW ABOUT:   Any allergies you  have.   All medicines you are taking, including vitamins, herbs, eyedrops, and over-the-counter medicines and creams.   Previous problems you or members of your family have had with the use of anesthetics.   Any blood disorders you have had.   Other health problems you have. RISKS AND COMPLICATIONS Generally, having a facet joint block is safe. However, as with any procedure, complications can occur. Possible complications associated with having a facet joint block include:   Bleeding.   Injury to a nerve near the injection site.   Pain at the injection site.   Weakness or numbness in areas controlled by nerves near the injection site.   Infection.   Temporary fluid retention.   Allergic reaction to anesthetics or medicines used during the procedure. BEFORE THE PROCEDURE   Follow your health care provider's instructions if you are taking dietary supplements or medicines. You may need to stop taking them or reduce your dosage.   Do not take any new dietary supplements or medicines without asking your health care provider first.   Follow your health care provider's instructions about eating and drinking before the procedure. You may need to stop eating and drinking several hours before the procedure.   Arrange to have an adult drive you home after the procedure. PROCEDURE  You may need to remove your clothing and dress in an open-back gown so that your health care provider can access your spine.   The procedure will be done while you are lying on an X-ray table. Most of the time you will be asked to lie on your stomach, but you may be asked to lie in a different position if an injection will be made in your neck.   Special machines will be used to monitor your oxygen levels, heart rate, and blood pressure.   If an injection will be made in your neck, an intravenous (  IV) tube will be inserted into one of your veins. Fluids and medicine will flow directly into  your body through the IV tube.   The area over the facet joint where the injection will be made will be cleaned with an antiseptic soap. The surrounding skin will be covered with sterile drapes.   An anesthetic will be applied to your skin to make the injection area numb. You may feel a temporary stinging or burning sensation.   A video X-ray machine will be used to locate the joint. A contrast dye may be injected into the facet joint area to help with locating the joint.   When the joint is located, an anesthetic medicine will be injected into the joint through the needle.   Your health care provider will ask you whether you feel pain relief. If you do feel relief, a steroid may be injected to provide pain relief for a longer period of time. If you do not feel relief or feel only partial relief, additional injections of an anesthetic may be made in other facet joints.   The needle will be removed, the skin will be cleansed, and bandages will be applied.  AFTER THE PROCEDURE   You will be observed for 15-30 minutes before being allowed to go home. Do not drive. Have an adult drive you or take a taxi or public transportation instead.   If you feel pain relief, the pain will return in several hours or days when the anesthetic wears off.   You may feel pain relief 2-14 days after the procedure. The amount of time this relief lasts varies from person to person.   It is normal to feel some tenderness over the injected area(s) for 2 days following the procedure.   If you have diabetes, you may have a temporary increase in blood sugar.   This information is not intended to replace advice given to you by your health care provider. Make sure you discuss any questions you have with your health care provider.   Document Released: 10/08/2006 Document Revised: 06/09/2014 Document Reviewed: 03/08/2012 Elsevier Interactive Patient Education [employer].

## 2016-04-14 NOTE — Patient Instructions (Addendum)
GENERAL RISKS AND COMPLICATIONS  What are the risk, side effects and possible complications? Generally speaking, most procedures are safe.  However, with any procedure there are risks, side effects, and the possibility of complications.  The risks and complications are dependent upon the sites that are lesioned, or the type of nerve block to be performed.  The closer the procedure is to the spine, the more serious the risks are.  Great care is taken when placing the radio frequency needles, block needles or lesioning probes, but sometimes complications can occur. 1. Infection: Any time there is an injection through the skin, there is a risk of infection.  This is why sterile conditions are used for these blocks.  There are four possible types of infection. 1. Localized skin infection. 2. Central Nervous System Infection-This can be in the form of Meningitis, which can be deadly. 3. Epidural Infections-This can be in the form of an epidural abscess, which can cause pressure inside of the spine, causing compression of the spinal cord with subsequent paralysis. This would require an emergency surgery to decompress, and there are no guarantees that the patient would recover from the paralysis. 4. Discitis-This is an infection of the intervertebral discs.  It occurs in about 1% of discography procedures.  It is difficult to treat and it may lead to surgery.        2. Pain: the needles have to go through skin and soft tissues, will cause soreness.       3. Damage to internal structures:  The nerves to be lesioned may be near blood vessels or    other nerves which can be potentially damaged.       4. Bleeding: Bleeding is more common if the patient is taking blood thinners such as  aspirin, Coumadin, Ticiid, Plavix, etc., or if he/she have some genetic predisposition  such as hemophilia. Bleeding into the spinal canal can cause compression of the spinal  cord with subsequent paralysis.  This would require an  emergency surgery to  decompress and there are no guarantees that the patient would recover from the  paralysis.       5. Pneumothorax:  Puncturing of a lung is a possibility, every time a needle is introduced in  the area of the chest or upper back.  Pneumothorax refers to free air around the  collapsed lung(s), inside of the thoracic cavity (chest cavity).  Another two possible  complications related to a similar event would include: Hemothorax and Chylothorax.   These are variations of the Pneumothorax, where instead of air around the collapsed  lung(s), you may have blood or chyle, respectively.       6. Spinal headaches: They may occur with any procedures in the area of the spine.       7. Persistent CSF (Cerebro-Spinal Fluid) leakage: This is a rare problem, but may occur  with prolonged intrathecal or epidural catheters either due to the formation of a fistulous  track or a dural tear.       8. Nerve damage: By working so close to the spinal cord, there is always a possibility of  nerve damage, which could be as serious as a permanent spinal cord injury with  paralysis.       9. Death:  Although rare, severe deadly allergic reactions known as "Anaphylactic  reaction" can occur to any of the medications used.      10. Worsening of the symptoms:  We can always make thing worse.    What are the chances of something like this happening? Chances of any of this occuring are extremely low.  By statistics, you have more of a chance of getting killed in a motor vehicle accident: while driving to the hospital than any of the above occurring .  Nevertheless, you should be aware that they are possibilities.  In general, it is similar to taking a shower.  Everybody knows that you can slip, hit your head and get killed.  Does that mean that you should not shower again?  Nevertheless always keep in mind that statistics do not mean anything if you happen to be on the wrong side of them.  Even if a procedure has a 1  (one) in a 1,000,000 (million) chance of going wrong, it you happen to be that one..Also, keep in mind that by statistics, you have more of a chance of having something go wrong when taking medications.  Who should not have this procedure? If you are on a blood thinning medication (e.g. Coumadin, Plavix, see list of "Blood Thinners"), or if you have an active infection going on, you should not have the procedure.  If you are taking any blood thinners, please inform your physician.  How should I prepare for this procedure?  Do not eat or drink anything at least six hours prior to the procedure.  Bring a driver with you .  It cannot be a taxi.  Come accompanied by an adult that can drive you back, and that is strong enough to help you if your legs get weak or numb from the local anesthetic.  Take all of your medicines the morning of the procedure with just enough water to swallow them.  If you have diabetes, make sure that you are scheduled to have your procedure done first thing in the morning, whenever possible.  If you have diabetes, take only half of your insulin dose and notify our nurse that you have done so as soon as you arrive at the clinic.  If you are diabetic, but only take blood sugar pills (oral hypoglycemic), then do not take them on the morning of your procedure.  You may take them after you have had the procedure.  Do not take aspirin or any aspirin-containing medications, at least eleven (11) days prior to the procedure.  They may prolong bleeding.  Wear loose fitting clothing that may be easy to take off and that you would not mind if it got stained with Betadine or blood.  Do not wear any jewelry or perfume  Remove any nail coloring.  It will interfere with some of our monitoring equipment.  NOTE: Remember that this is not meant to be interpreted as a complete list of all possible complications.  Unforeseen problems may occur.  BLOOD THINNERS The following drugs  contain aspirin or other products, which can cause increased bleeding during surgery and should not be taken for 2 weeks prior to and 1 week after surgery.  If you should need take something for relief of minor pain, you may take acetaminophen which is found in Tylenol,m Datril, Anacin-3 and Panadol. It is not blood thinner. The products listed below are.  Do not take any of the products listed below in addition to any listed on your instruction sheet.  A.P.C or A.P.C with Codeine Codeine Phosphate Capsules #3 Ibuprofen Ridaura  ABC compound Congesprin Imuran rimadil  Advil Cope Indocin Robaxisal  Alka-Seltzer Effervescent Pain Reliever and Antacid Coricidin or Coricidin-D  Indomethacin Rufen    Alka-Seltzer plus Cold Medicine Cosprin Ketoprofen S-A-C Tablets  Anacin Analgesic Tablets or Capsules Coumadin Korlgesic Salflex  Anacin Extra Strength Analgesic tablets or capsules CP-2 Tablets Lanoril Salicylate  Anaprox Cuprimine Capsules Levenox Salocol  Anexsia-D Dalteparin Magan Salsalate  Anodynos Darvon compound Magnesium Salicylate Sine-off  Ansaid Dasin Capsules Magsal Sodium Salicylate  Anturane Depen Capsules Marnal Soma  APF Arthritis pain formula Dewitt's Pills Measurin Stanback  Argesic Dia-Gesic Meclofenamic Sulfinpyrazone  Arthritis Bayer Timed Release Aspirin Diclofenac Meclomen Sulindac  Arthritis pain formula Anacin Dicumarol Medipren Supac  Analgesic (Safety coated) Arthralgen Diffunasal Mefanamic Suprofen  Arthritis Strength Bufferin Dihydrocodeine Mepro Compound Suprol  Arthropan liquid Dopirydamole Methcarbomol with Aspirin Synalgos  ASA tablets/Enseals Disalcid Micrainin Tagament  Ascriptin Doan's Midol Talwin  Ascriptin A/D Dolene Mobidin Tanderil  Ascriptin Extra Strength Dolobid Moblgesic Ticlid  Ascriptin with Codeine Doloprin or Doloprin with Codeine Momentum Tolectin  Asperbuf Duoprin Mono-gesic Trendar  Aspergum Duradyne Motrin or Motrin IB Triminicin  Aspirin  plain, buffered or enteric coated Durasal Myochrisine Trigesic  Aspirin Suppositories Easprin Nalfon Trillsate  Aspirin with Codeine Ecotrin Regular or Extra Strength Naprosyn Uracel  Atromid-S Efficin Naproxen Ursinus  Auranofin Capsules Elmiron Neocylate Vanquish  Axotal Emagrin Norgesic Verin  Azathioprine Empirin or Empirin with Codeine Normiflo Vitamin E  Azolid Emprazil Nuprin Voltaren  Bayer Aspirin plain, buffered or children's or timed BC Tablets or powders Encaprin Orgaran Warfarin Sodium  Buff-a-Comp Enoxaparin Orudis Zorpin  Buff-a-Comp with Codeine Equegesic Os-Cal-Gesic   Buffaprin Excedrin plain, buffered or Extra Strength Oxalid   Bufferin Arthritis Strength Feldene Oxphenbutazone   Bufferin plain or Extra Strength Feldene Capsules Oxycodone with Aspirin   Bufferin with Codeine Fenoprofen Fenoprofen Pabalate or Pabalate-SF   Buffets II Flogesic Panagesic   Buffinol plain or Extra Strength Florinal or Florinal with Codeine Panwarfarin   Buf-Tabs Flurbiprofen Penicillamine   Butalbital Compound Four-way cold tablets Penicillin   Butazolidin Fragmin Pepto-Bismol   Carbenicillin Geminisyn Percodan   Carna Arthritis Reliever Geopen Persantine   Carprofen Gold's salt Persistin   Chloramphenicol Goody's Phenylbutazone   Chloromycetin Haltrain Piroxlcam   Clmetidine heparin Plaquenil   Cllnoril Hyco-pap Ponstel   Clofibrate Hydroxy chloroquine Propoxyphen         Before stopping any of these medications, be sure to consult the physician who ordered them.  Some, such as Coumadin (Warfarin) are ordered to prevent or treat serious conditions such as "deep thrombosis", "pumonary embolisms", and other heart problems.  The amount of time that you may need off of the medication may also vary with the medication and the reason for which you were taking it.  If you are taking any of these medications, please make sure you notify your pain physician before you undergo any  procedures.         Facet Joint Block The facet joints connect the bones of the spine (vertebrae). They make it possible for you to bend, twist, and make other movements with your spine. They also prevent you from overbending, overtwisting, and making other excessive movements.  A facet joint block is a procedure where a numbing medicine (anesthetic) is injected into a facet joint. Often, a type of anti-inflammatory medicine called a steroid is also injected. A facet joint block may be done for two reasons:   Diagnosis. A facet joint block may be done as a test to see whether neck or back pain is caused by a worn-down or infected facet joint. If the pain gets better after a facet joint block, it means the   pain is probably coming from the facet joint. If the pain does not get better, it means the pain is probably not coming from the facet joint.   Therapy. A facet joint block may be done to relieve neck or back pain caused by a facet joint. A facet joint block is only done as a therapy if the pain does not improve with medicine, exercise programs, physical therapy, and other forms of pain management. LET YOUR HEALTH CARE PROVIDER KNOW ABOUT:   Any allergies you have.   All medicines you are taking, including vitamins, herbs, eyedrops, and over-the-counter medicines and creams.   Previous problems you or members of your family have had with the use of anesthetics.   Any blood disorders you have had.   Other health problems you have. RISKS AND COMPLICATIONS Generally, having a facet joint block is safe. However, as with any procedure, complications can occur. Possible complications associated with having a facet joint block include:   Bleeding.   Injury to a nerve near the injection site.   Pain at the injection site.   Weakness or numbness in areas controlled by nerves near the injection site.   Infection.   Temporary fluid retention.   Allergic reaction to  anesthetics or medicines used during the procedure. BEFORE THE PROCEDURE   Follow your health care provider's instructions if you are taking dietary supplements or medicines. You may need to stop taking them or reduce your dosage.   Do not take any new dietary supplements or medicines without asking your health care provider first.   Follow your health care provider's instructions about eating and drinking before the procedure. You may need to stop eating and drinking several hours before the procedure.   Arrange to have an adult drive you home after the procedure. PROCEDURE  You may need to remove your clothing and dress in an open-back gown so that your health care provider can access your spine.   The procedure will be done while you are lying on an X-ray table. Most of the time you will be asked to lie on your stomach, but you may be asked to lie in a different position if an injection will be made in your neck.   Special machines will be used to monitor your oxygen levels, heart rate, and blood pressure.   If an injection will be made in your neck, an intravenous (IV) tube will be inserted into one of your veins. Fluids and medicine will flow directly into your body through the IV tube.   The area over the facet joint where the injection will be made will be cleaned with an antiseptic soap. The surrounding skin will be covered with sterile drapes.   An anesthetic will be applied to your skin to make the injection area numb. You may feel a temporary stinging or burning sensation.   A video X-ray machine will be used to locate the joint. A contrast dye may be injected into the facet joint area to help with locating the joint.   When the joint is located, an anesthetic medicine will be injected into the joint through the needle.   Your health care provider will ask you whether you feel pain relief. If you do feel relief, a steroid may be injected to provide pain relief for a  longer period of time. If you do not feel relief or feel only partial relief, additional injections of an anesthetic may be made in other facet joints.     The needle will be removed, the skin will be cleansed, and bandages will be applied.  AFTER THE PROCEDURE   You will be observed for 15-30 minutes before being allowed to go home. Do not drive. Have an adult drive you or take a taxi or public transportation instead.   If you feel pain relief, the pain will return in several hours or days when the anesthetic wears off.   You may feel pain relief 2-14 days after the procedure. The amount of time this relief lasts varies from person to person.   It is normal to feel some tenderness over the injected area(s) for 2 days following the procedure.   If you have diabetes, you may have a temporary increase in blood sugar.   This information is not intended to replace advice given to you by your health care provider. Make sure you discuss any questions you have with your health care provider.   Document Released: 10/08/2006 Document Revised: 06/09/2014 Document Reviewed: 03/08/2012 Elsevier Interactive Patient Education 2016 [employer]  

## 2016-05-05 DIAGNOSIS — M1712 Unilateral primary osteoarthritis, left knee: Secondary | ICD-10-CM | POA: Diagnosis not present

## 2016-05-05 DIAGNOSIS — M7542 Impingement syndrome of left shoulder: Secondary | ICD-10-CM | POA: Diagnosis not present

## 2016-05-21 DIAGNOSIS — D225 Melanocytic nevi of trunk: Secondary | ICD-10-CM | POA: Diagnosis not present

## 2016-05-21 DIAGNOSIS — Z872 Personal history of diseases of the skin and subcutaneous tissue: Secondary | ICD-10-CM | POA: Diagnosis not present

## 2016-05-21 DIAGNOSIS — D2261 Melanocytic nevi of right upper limb, including shoulder: Secondary | ICD-10-CM | POA: Diagnosis not present

## 2016-05-21 DIAGNOSIS — Z1283 Encounter for screening for malignant neoplasm of skin: Secondary | ICD-10-CM | POA: Diagnosis not present

## 2016-05-29 DIAGNOSIS — M25561 Pain in right knee: Secondary | ICD-10-CM | POA: Diagnosis not present

## 2016-06-10 ENCOUNTER — Other Ambulatory Visit: Payer: Self-pay | Admitting: Orthopedic Surgery

## 2016-06-10 DIAGNOSIS — M25561 Pain in right knee: Secondary | ICD-10-CM

## 2016-06-12 ENCOUNTER — Ambulatory Visit
Admission: RE | Admit: 2016-06-12 | Discharge: 2016-06-12 | Disposition: A | Discharge status: Home / Self Care | Payer: Medicare Other | Source: Ambulatory Visit | Attending: Orthopedic Surgery | Admitting: Orthopedic Surgery

## 2016-06-12 DIAGNOSIS — M7121 Synovial cyst of popliteal space [Baker], right knee: Secondary | ICD-10-CM | POA: Diagnosis not present

## 2016-06-12 DIAGNOSIS — S83241A Other tear of medial meniscus, current injury, right knee, initial encounter: Secondary | ICD-10-CM | POA: Insufficient documentation

## 2016-06-12 DIAGNOSIS — M1711 Unilateral primary osteoarthritis, right knee: Secondary | ICD-10-CM | POA: Diagnosis not present

## 2016-06-12 DIAGNOSIS — X58XXXA Exposure to other specified factors, initial encounter: Secondary | ICD-10-CM | POA: Insufficient documentation

## 2016-06-12 DIAGNOSIS — M9250 Juvenile osteochondrosis of tibia and fibula, unspecified leg: Secondary | ICD-10-CM | POA: Insufficient documentation

## 2016-06-12 DIAGNOSIS — M25561 Pain in right knee: Secondary | ICD-10-CM | POA: Diagnosis not present

## 2016-06-16 DIAGNOSIS — S83231A Complex tear of medial meniscus, current injury, right knee, initial encounter: Secondary | ICD-10-CM | POA: Diagnosis not present

## 2016-06-19 ENCOUNTER — Other Ambulatory Visit: Payer: Self-pay | Admitting: Orthopedic Surgery

## 2016-06-23 DIAGNOSIS — H40003 Preglaucoma, unspecified, bilateral: Secondary | ICD-10-CM | POA: Diagnosis not present

## 2016-06-25 ENCOUNTER — Encounter: Payer: Self-pay | Admitting: Family Medicine

## 2016-06-25 ENCOUNTER — Ambulatory Visit (INDEPENDENT_AMBULATORY_CARE_PROVIDER_SITE_OTHER): Payer: Medicare Other | Admitting: Family Medicine

## 2016-06-25 VITALS — BP 122/56 | HR 62 | Temp 97.7°F | Resp 16 | Wt 174.0 lb

## 2016-06-25 DIAGNOSIS — Z01818 Encounter for other preprocedural examination: Secondary | ICD-10-CM

## 2016-06-25 NOTE — Progress Notes (Signed)
Subjective:  HPI Pt is here today for surgical clearance. He is scheduled to have a right knee arthroscopy with medial meniscectomy with Dr. Mack Guise on 07/08/16. He denies any symptoms of chest pain, shortness of breath or weakness. No history of problems with anaesthesia. Pt is feeling well. He does say that he has some tingling/numbness on his pinky and ring fingers on his left hand when he wakes up in the mornings.   Prior to Admission medications   Medication Sig Start Date End Date Taking? Authorizing Provider  amLODipine (NORVASC) 5 MG tablet Take 5 mg by mouth daily.    Historical Provider, MD  aspirin 81 MG tablet Take by mouth daily.  08/12/11   Historical Provider, MD  atorvastatin (LIPITOR) 20 MG tablet Take by mouth daily.  04/24/14   Historical Provider, MD  Biotin 5000 MCG CAPS Take by mouth daily.     Historical Provider, MD  Cholecalciferol (VITAMIN D3) 2000 UNITS capsule Take 2,000 Units by mouth 2 (two) times daily.     Historical Provider, MD  COENZYME Q-10 PO Take 200 mg by mouth daily.  08/12/11   Historical Provider, MD  dorzolamide-timolol (COSOPT) 22.3-6.8 MG/ML ophthalmic solution  07/03/15   Historical Provider, MD  glucosamine-chondroitin 500-400 MG tablet Take 1 tablet by mouth 2 (two) times daily.    Historical Provider, MD  isosorbide mononitrate (IMDUR) 30 MG 24 hr tablet Take 30 mg by mouth.  08/12/11   Historical Provider, MD  isosorbide mononitrate (IMDUR) 60 MG 24 hr tablet  06/09/16   Historical Provider, MD  nitroGLYCERIN (NITROSTAT) 0.4 MG SL tablet Place under the tongue every 5 (five) minutes as needed.  02/25/11   Historical Provider, MD  OMEGA-3 FATTY ACIDS PO Take 1,200 mcg by mouth 2 (two) times daily.  02/25/11   Historical Provider, MD  omeprazole (PRILOSEC) 20 MG capsule daily. Reported on 08/21/2015 07/28/15   Historical Provider, MD  tamsulosin (FLOMAX) 0.4 MG CAPS capsule TAKE 1 CAPSULE BY MOUTH EVERY DAY 11/26/15   Jerrol Banana., MD    Travoprost, BAK Free, (TRAVATAN Z) 0.004 % SOLN ophthalmic solution Place 1 drop into both eyes at bedtime.  08/12/11   Historical Provider, MD  valsartan-hydrochlorothiazide (DIOVAN HCT) 320-25 MG per tablet Take 1 tablet by mouth daily.     Historical Provider, MD    Patient Active Problem List   Diagnosis Date Noted  . Hyperglycemia 08/27/2015  . Chronic pain 08/21/2015  . Peripheral vascular disease ([place]) 08/14/2015  . Lumbar central spinal stenosis (severe at L4-5) 08/14/2015  . Lumbar facet hypertrophy (multilevel) 08/14/2015  . Grade 1 Anterolisthesis of L3 over L4 08/14/2015  . Lumbar foraminal stenosis (Right L2-3; Bilateral L3-4, L4-5) 08/14/2015  . Chronic low back pain (Location of Primary Source of Pain) (Bilateral) (R>L) 08/13/2015  . Lumbar spondylosis 08/13/2015  . Lumbar facet syndrome (Bilateral) (R>L) 08/13/2015  . Bradycardia 05/03/2015  . Coronary artery disease involving native coronary artery of native heart with unstable angina pectoris ([place]) 01/08/2015  . Absolute anemia 12/08/2014  . Benign fibroma of prostate 12/08/2014  . Atherosclerosis of coronary artery 12/08/2014  . Cataract 12/08/2014  . Esophageal stenosis 12/08/2014  . Acid reflux 12/08/2014  . Glaucoma 12/08/2014  . Hemorrhoid 12/08/2014  . HLD (hyperlipidemia) 12/08/2014  . Calculus of kidney 12/08/2014  . Obstructive apnea 12/08/2014  . Borderline diabetes 12/08/2014  . Peripheral blood vessel disorder ([place]) 12/08/2014  . Esophagogastric ring 12/08/2014  . Heart valve disease 12/08/2014  .  MI (mitral incompetence) 10/03/2014  . Cardiomyopathy, ischemic 10/03/2014  . History of colonic polyps 10/02/2014  . Benign essential HTN 09/20/2014    Past Medical History:  Diagnosis Date  . Allergy   . Arthritis   . GERD (gastroesophageal reflux disease)   . Hypertension   . Myocardial infarction 2011    Social History   Social History  . Marital status: Married    Spouse name: Blanch Media  .  Number of children: 1  . Years of education: N/A   Occupational History  .  Retired   Social History Main Topics  . Smoking status: Former Smoker    Years: 15.00    Types: Cigarettes  . Smokeless tobacco: Never Used  . Alcohol use Yes     Comment: 1-2 glasses of wine a night  . Drug use: No  . Sexual activity: No   Other Topics Concern  . Not on file   Social History Narrative  . No narrative on file    Allergies  Allergen Reactions  . Penicillins Rash    Review of Systems  Constitutional: Negative.   HENT: Negative.   Eyes: Negative.   Respiratory: Negative.   Cardiovascular: Negative.   Gastrointestinal: Negative.   Genitourinary: Negative.   Musculoskeletal: Negative.   Skin: Negative.   Neurological: Positive for tingling (left pinky and ring fingers in the mornings.).  Endo/Heme/Allergies: Negative.   Psychiatric/Behavioral: Negative.     Immunization History  Administered Date(s) Administered  . Influenza, High Dose Seasonal PF 02/27/2016  . Influenza-Unspecified 03/03/2015  . Pneumococcal Conjugate-13 12/14/2013  . Pneumococcal Polysaccharide-23 04/04/1998, 03/25/2006  . Td 05/01/2008  . Tdap 02/25/2011  . Zoster 10/07/2011    Objective:  BP (!) 122/56 (BP Location: Left Arm, Patient Position: Sitting, Cuff Size: Normal)   Pulse 62   Temp 97.7 F (36.5 C) (Oral)   Resp 16   Wt 174 lb (78.9 kg)   SpO2 98%   BMI 27.25 kg/m   Physical Exam  Constitutional: He is oriented to person, place, and time and well-developed, well-nourished, and in no distress.  HENT:  Head: Normocephalic and atraumatic.  Right Ear: External ear normal.  Left Ear: External ear normal.  Nose: Nose normal.  Eyes: Conjunctivae are normal. No scleral icterus.  Neck: No thyromegaly present.  Cardiovascular: Normal rate and regular rhythm.   Pulmonary/Chest: Effort normal and breath sounds normal.  Abdominal: Soft.  Lymphadenopathy:    He has no cervical adenopathy.    Neurological: He is alert and oriented to person, place, and time. GCS score is 15.  Skin: Skin is warm and dry.  Psychiatric: Mood, memory, affect and judgment normal.    Lab Results  Component Value Date   WBC 6.0 02/27/2016   HGB 14.6 05/09/2013   HCT 42.3 02/27/2016   PLT 159 02/27/2016   GLUCOSE 113 (H) 02/27/2016   CHOL 147 02/27/2016   TRIG 76 02/27/2016   HDL 49 02/27/2016   LDLCALC 83 02/27/2016   TSH 3.180 02/27/2016   HGBA1C 5.4 02/27/2016    CMP     Component Value Date/Time   NA 139 02/27/2016 1031   NA 137 05/09/2013 0912   K 4.4 02/27/2016 1031   K 4.4 05/09/2013 0912   CL 100 02/27/2016 1031   CL 103 05/09/2013 0912   CO2 24 02/27/2016 1031   CO2 29 05/09/2013 0912   GLUCOSE 113 (H) 02/27/2016 1031   GLUCOSE 93 05/09/2013 0912   BUN 25 02/27/2016  1031   BUN 26 (H) 05/09/2013 0912   CREATININE 1.61 (H) 02/27/2016 1031   CREATININE 1.50 (H) 05/09/2013 0912   CALCIUM 9.3 02/27/2016 1031   CALCIUM 9.2 05/09/2013 0912   PROT 6.9 02/27/2016 1031   ALBUMIN 4.4 02/27/2016 1031   AST 21 02/27/2016 1031   ALT 29 02/27/2016 1031   ALKPHOS 81 02/27/2016 1031   BILITOT 0.7 02/27/2016 1031   GFRNONAA 38 (L) 02/27/2016 1031   GFRNONAA 43 (L) 05/09/2013 0912   GFRAA 44 (L) 02/27/2016 1031   GFRAA 50 (L) 05/09/2013 0912    Assessment and Plan :  1. Pre-operative clearance Patient medically cleared for knee surgery. - EKG 12-Lead 2. Knee pain 3. CAD 4.HTN 5.HLD  I have done the exam and reviewed the chart and it is accurate to the best of my knowledge. Development worker, community has been used and  any errors in dictation or transcription are unintentional. Miguel Aschoff M.D. [place] MD [facility] 06/25/2016 10:17 AM

## 2016-06-30 ENCOUNTER — Telehealth: Payer: Self-pay | Admitting: Family Medicine

## 2016-06-30 ENCOUNTER — Other Ambulatory Visit: Payer: Medicare Other

## 2016-06-30 DIAGNOSIS — S83231D Complex tear of medial meniscus, current injury, right knee, subsequent encounter: Secondary | ICD-10-CM | POA: Diagnosis not present

## 2016-06-30 DIAGNOSIS — H40003 Preglaucoma, unspecified, bilateral: Secondary | ICD-10-CM | POA: Diagnosis not present

## 2016-06-30 NOTE — Telephone Encounter (Signed)
Faxed form and notes-aa

## 2016-06-30 NOTE — Telephone Encounter (Signed)
Surgical clearance note from 06/25/2016 has not been signed. Can you sign note? Thanks! Renaldo Fiddler, CMA

## 2016-06-30 NOTE — Telephone Encounter (Signed)
Judeen Hammans wanted to see if the pt's surgical clearance had been faxed or when they could expect to receive it b/c pt is scheduled for surgery next week. Please advise. Thanks TNP

## 2016-06-30 NOTE — Telephone Encounter (Signed)
done

## 2016-07-01 ENCOUNTER — Inpatient Hospital Stay: Admission: RE | Admit: 2016-07-01 | Payer: Medicare Other | Source: Ambulatory Visit

## 2016-07-08 ENCOUNTER — Encounter: Admission: RE | Payer: Self-pay | Source: Ambulatory Visit

## 2016-07-08 ENCOUNTER — Ambulatory Visit: Admission: RE | Admit: 2016-07-08 | Payer: Medicare Other | Source: Ambulatory Visit | Admitting: Orthopedic Surgery

## 2016-07-08 SURGERY — ARTHROSCOPY, KNEE, WITH MEDIAL MENISCECTOMY
Anesthesia: Choice | Laterality: Right

## 2016-07-16 DIAGNOSIS — S83249A Other tear of medial meniscus, current injury, unspecified knee, initial encounter: Secondary | ICD-10-CM | POA: Insufficient documentation

## 2016-07-16 DIAGNOSIS — S83231D Complex tear of medial meniscus, current injury, right knee, subsequent encounter: Secondary | ICD-10-CM | POA: Diagnosis not present

## 2016-07-24 IMAGING — CR DG LUMBAR SPINE 2-3V
1 series · 3 of 3 positions shown · non-contrast
Comparison: AP view of the abdomen of April 02, 2010. Report of
an MRI of the lumbar spine dated December 15, 2009.

CLINICAL DATA: [DATE] year history of low back pain with worsening
symptoms over the past 3 months without known injury, no radicular
symptoms.

EXAM:
LUMBAR SPINE - 2-3 VIEW

[Series 1: dg lumbar spine 2-3 views · 0.14mm/px · 3 of 3 slices shown]
[im 1/3]
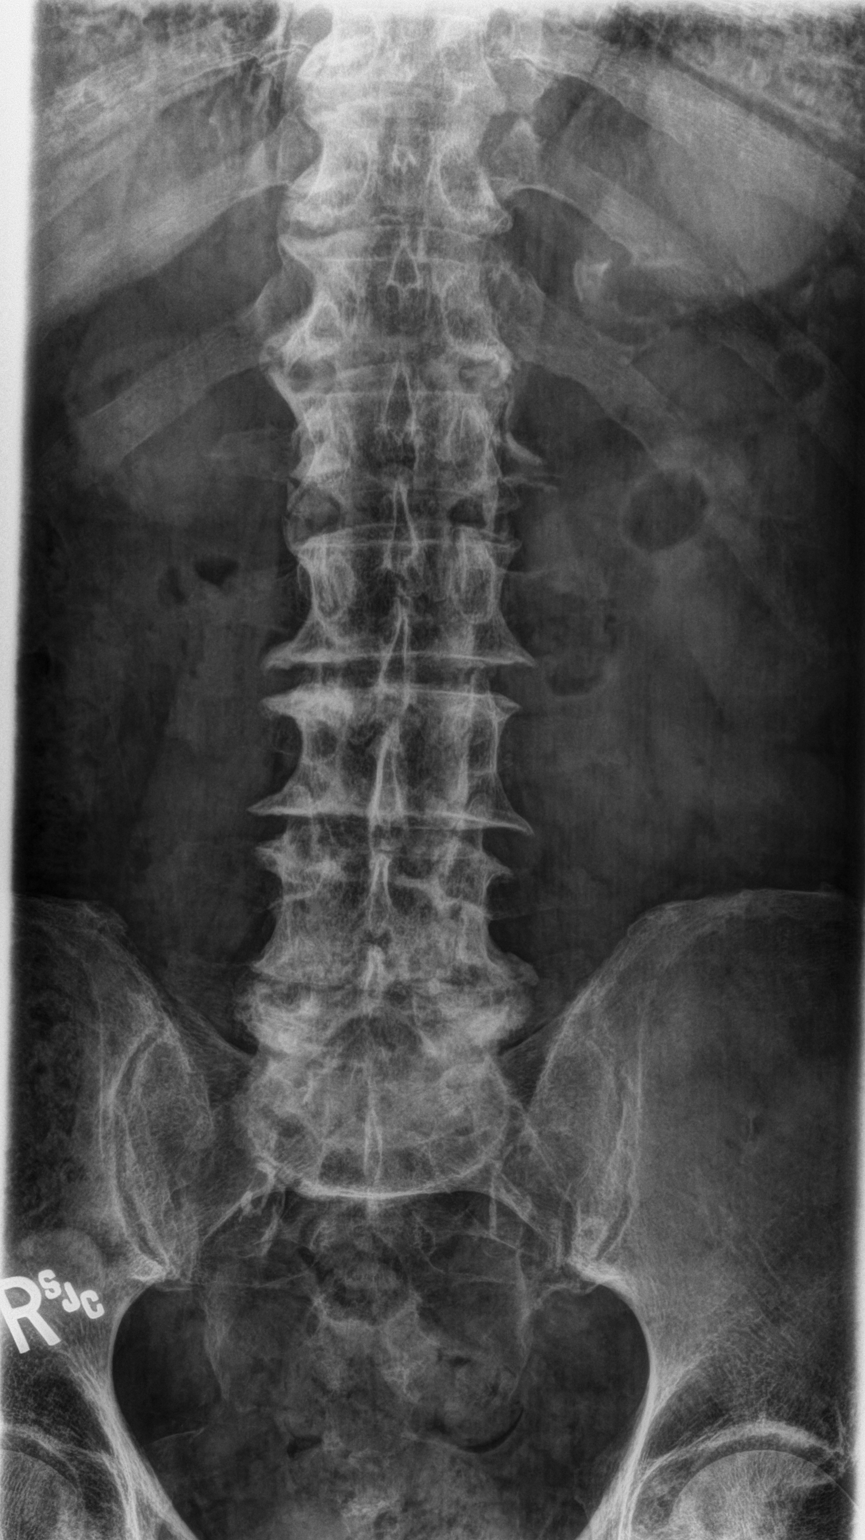
[im 2/3]
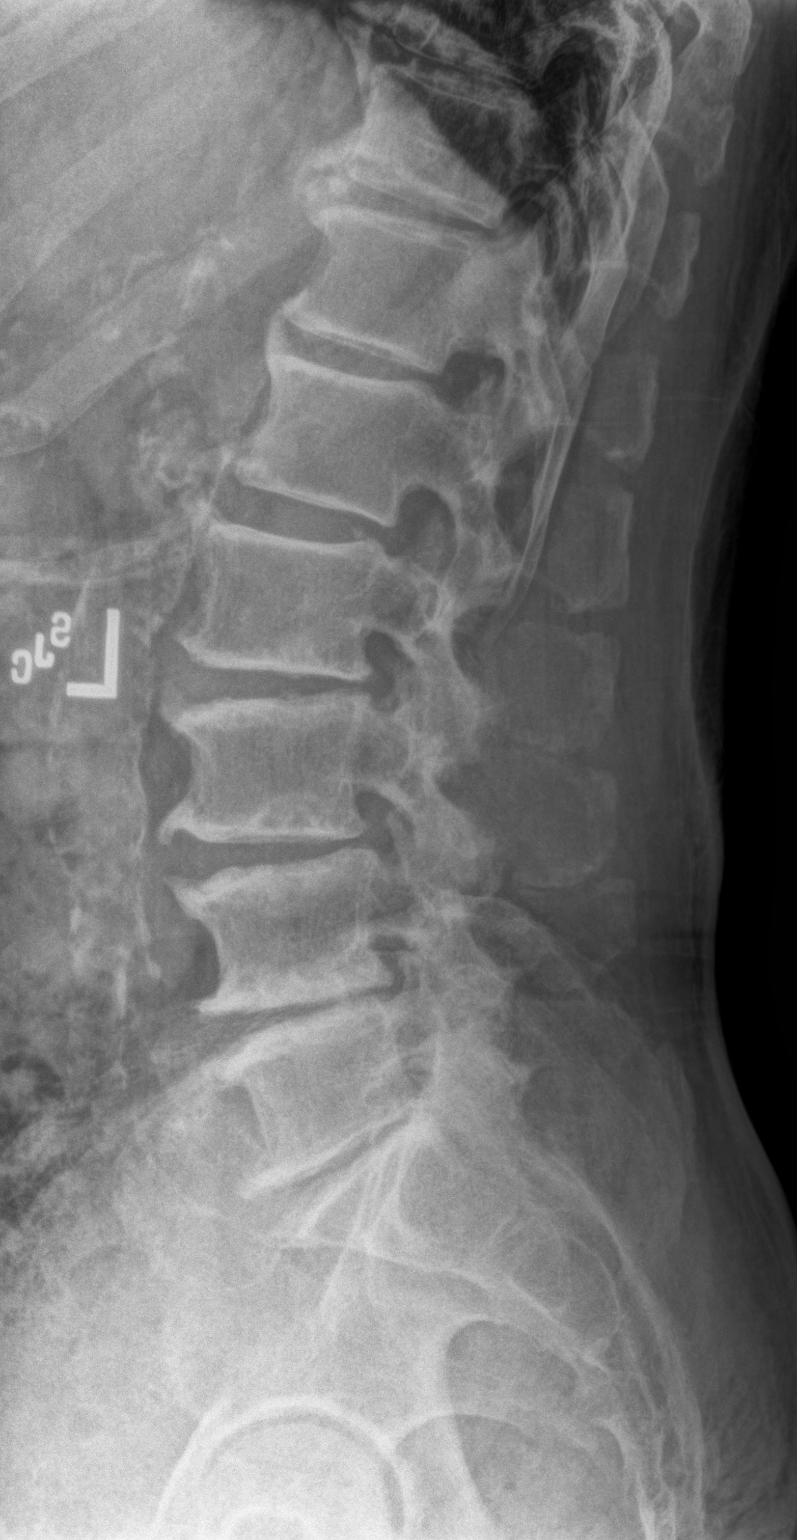
[im 3/3]
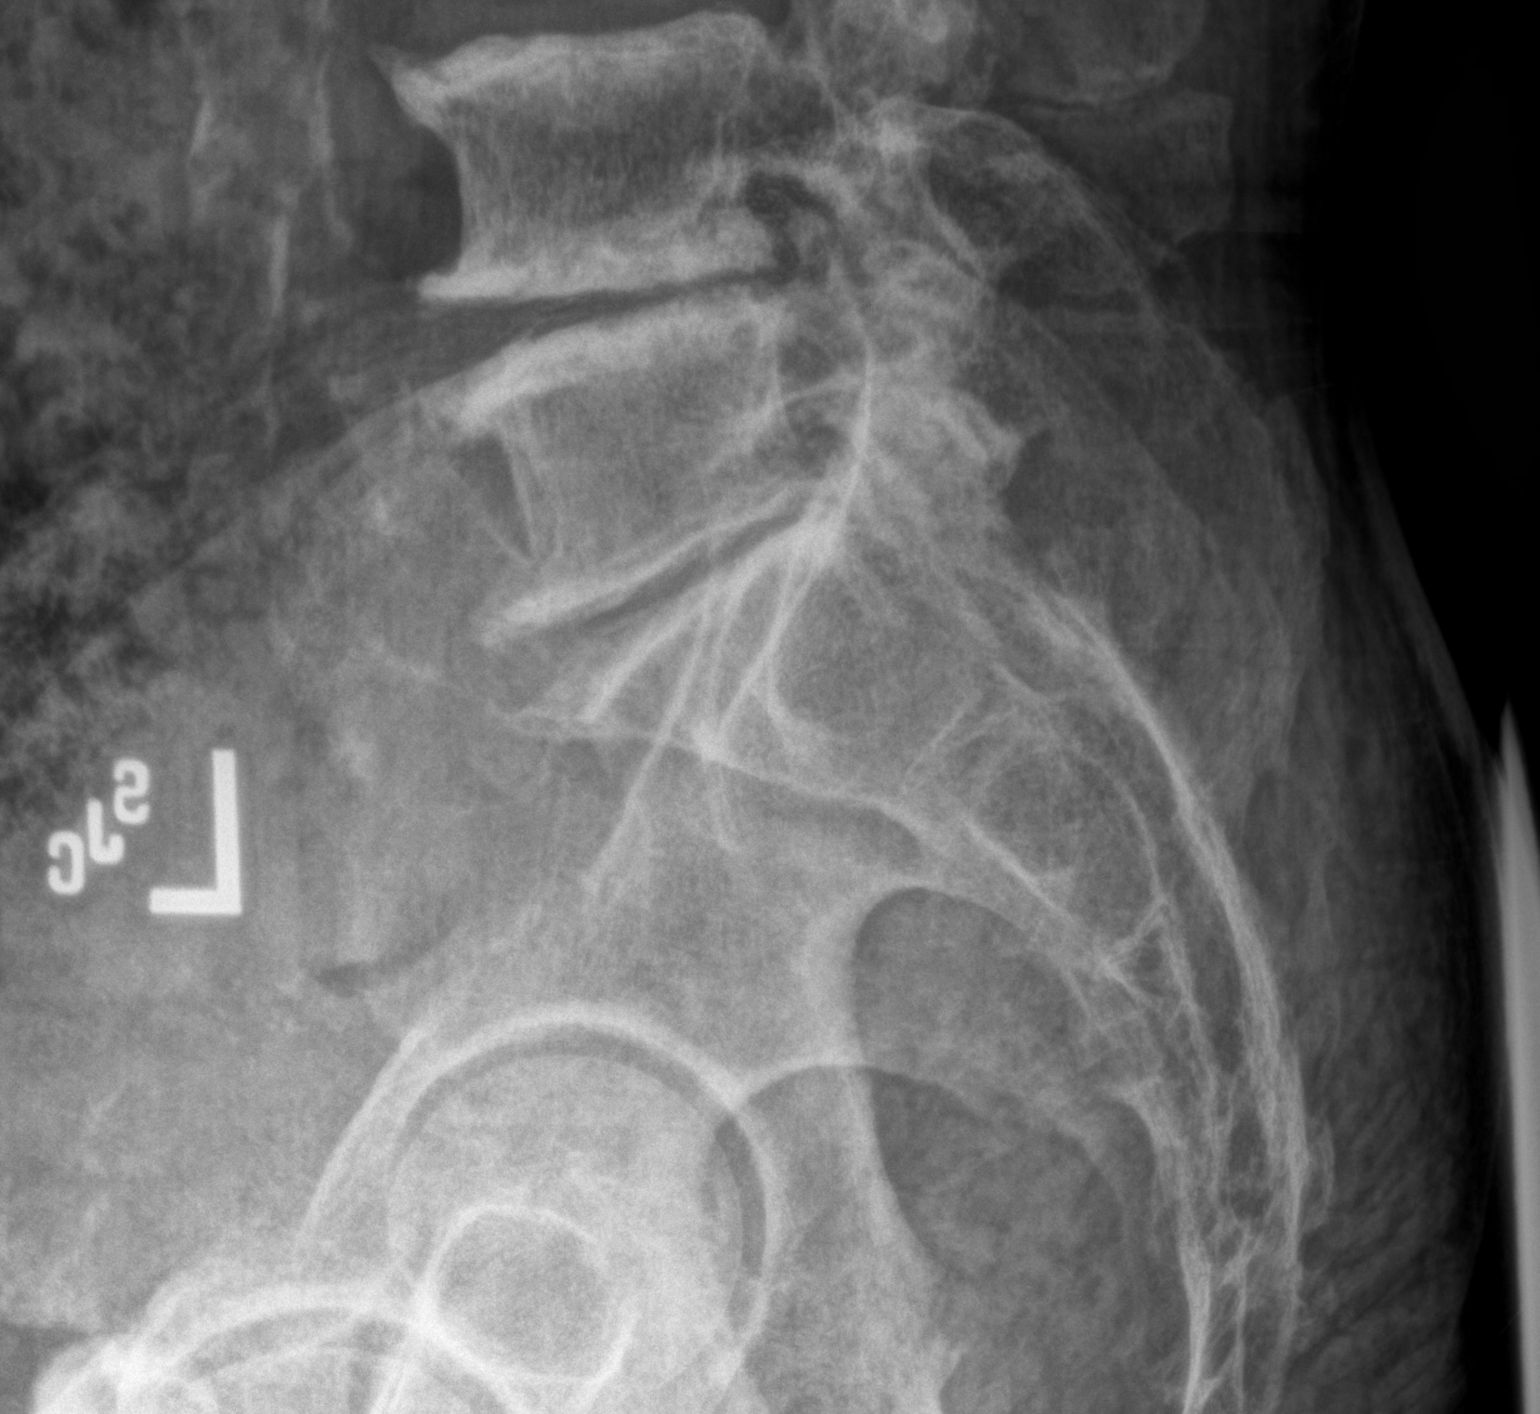

[3 of 3 positions shown; findings below may reference images not displayed]

FINDINGS: The lumbar vertebral bodies are preserved in height. There is
moderate disc space narrowing at L4-5 and L5-S1 with milder
narrowing at L2-3 and at L3-4. There is no spondylolisthesis. There
are moderate-sized anterior endplate osteophytes at all lumbar
levels. There is a small posterior osteophyte inferiorly at L4.
There is facet joint hypertrophy at L4-5 and at L5-S1. The pedicles
and transverse processes are intact. The observed portions of the
sacrum are intact.
IMPRESSION: There is moderate multilevel degenerative disc space loss centered
from L3 inferiorly. There is facet joint hypertrophy at L4-5 and
L5-S1. There is no compression fracture.

## 2016-07-25 ENCOUNTER — Other Ambulatory Visit: Payer: Self-pay | Admitting: Orthopedic Surgery

## 2016-07-28 ENCOUNTER — Encounter
Admission: RE | Admit: 2016-07-28 | Discharge: 2016-07-28 | Disposition: A | Discharge status: Home / Self Care | Payer: Medicare Other | Source: Ambulatory Visit | Attending: Orthopedic Surgery | Admitting: Orthopedic Surgery

## 2016-07-28 ENCOUNTER — Inpatient Hospital Stay: Admission: RE | Admit: 2016-07-28 | Payer: Medicare Other | Source: Ambulatory Visit

## 2016-07-28 DIAGNOSIS — M25569 Pain in unspecified knee: Secondary | ICD-10-CM | POA: Diagnosis not present

## 2016-07-28 DIAGNOSIS — Z01812 Encounter for preprocedural laboratory examination: Secondary | ICD-10-CM | POA: Diagnosis not present

## 2016-07-28 DIAGNOSIS — Z79899 Other long term (current) drug therapy: Secondary | ICD-10-CM | POA: Diagnosis not present

## 2016-07-28 DIAGNOSIS — I10 Essential (primary) hypertension: Secondary | ICD-10-CM | POA: Diagnosis not present

## 2016-07-28 DIAGNOSIS — I2510 Atherosclerotic heart disease of native coronary artery without angina pectoris: Secondary | ICD-10-CM | POA: Diagnosis not present

## 2016-07-28 DIAGNOSIS — E785 Hyperlipidemia, unspecified: Secondary | ICD-10-CM | POA: Diagnosis not present

## 2016-07-28 HISTORY — DX: Personal history of urinary calculi: Z87.442

## 2016-07-28 HISTORY — DX: Endocarditis, valve unspecified: I38

## 2016-07-28 HISTORY — DX: Atherosclerotic heart disease of native coronary artery without angina pectoris: I25.10

## 2016-07-28 HISTORY — DX: Peripheral vascular disease, unspecified: I73.9

## 2016-07-28 HISTORY — DX: Hyperlipidemia, unspecified: E78.5

## 2016-07-28 LAB — CBC WITH DIFFERENTIAL/PLATELET
BASOS ABS: 0 10*3/uL (ref 0–0.1)
Basophils Relative: 1 %
Eosinophils Absolute: 0.1 10*3/uL (ref 0–0.7)
Eosinophils Relative: 2 %
HEMATOCRIT: 39 % — AB (ref 40.0–52.0)
Hemoglobin: 13.6 g/dL (ref 13.0–18.0)
LYMPHS ABS: 1 10*3/uL (ref 1.0–3.6)
LYMPHS PCT: 16 %
MCH: 31.8 pg (ref 26.0–34.0)
MCHC: 34.9 g/dL (ref 32.0–36.0)
MCV: 91.3 fL (ref 80.0–100.0)
MONOS PCT: 7 %
Monocytes Absolute: 0.4 10*3/uL (ref 0.2–1.0)
NEUTROS ABS: 4.7 10*3/uL (ref 1.4–6.5)
Neutrophils Relative %: 74 %
Platelets: 168 10*3/uL (ref 150–440)
RBC: 4.27 MIL/uL — ABNORMAL LOW (ref 4.40–5.90)
RDW: 13.1 % (ref 11.5–14.5)
WBC: 6.3 10*3/uL (ref 3.8–10.6)

## 2016-07-28 LAB — PROTIME-INR
INR: 1.03
Prothrombin Time: 13.5 seconds (ref 11.4–15.2)

## 2016-07-28 LAB — BASIC METABOLIC PANEL
ANION GAP: 9 (ref 5–15)
BUN: 23 mg/dL — ABNORMAL HIGH (ref 6–20)
CHLORIDE: 101 mmol/L (ref 101–111)
CO2: 24 mmol/L (ref 22–32)
Calcium: 8.9 mg/dL (ref 8.9–10.3)
Creatinine, Ser: 1.51 mg/dL — ABNORMAL HIGH (ref 0.61–1.24)
GFR calc Af Amer: 47 mL/min — ABNORMAL LOW (ref >60)
GFR, EST NON AFRICAN AMERICAN: 40 mL/min — AB (ref >60)
GLUCOSE: 162 mg/dL — AB (ref 65–99)
POTASSIUM: 4 mmol/L (ref 3.5–5.1)
Sodium: 134 mmol/L — ABNORMAL LOW (ref 135–145)

## 2016-07-28 LAB — APTT: aPTT: 41 seconds — ABNORMAL HIGH (ref 24–36)

## 2016-07-28 NOTE — Patient Instructions (Signed)
Your procedure is scheduled on: Tuesday 08/05/16 Report to [place]. 2ND FLOOR MEDICAL MALL ENTRANCE. To find out your arrival time please call [phone] between 1PM - 3PM on Monday 08/04/16.  Remember: Instructions that are not followed completely may result in serious medical risk, up to and including death, or upon the discretion of your surgeon and anesthesiologist your surgery may need to be rescheduled.    __X__ 1. Do not eat food or drink liquids after midnight. No gum chewing or hard candies.     __X__ 2. No Alcohol for 24 hours before or after surgery.   ____ 3. Bring all medications with you on the day of surgery if instructed.    __X__ 4. Notify your doctor if there is any change in your medical condition     (cold, fever, infections).             __X___5. No smoking within 24 hours of your surgery.     Do not wear jewelry, make-up, hairpins, clips or nail polish.  Do not wear lotions, powders, or perfumes.   Do not shave 48 hours prior to surgery. Men may shave face and neck.  Do not bring valuables to the hospital.    [facility] is not responsible for any belongings or valuables.               Contacts, dentures or bridgework may not be worn into surgery.  Leave your suitcase in the car. After surgery it may be brought to your room.  For patients admitted to the hospital, discharge time is determined by your                treatment team.   Patients discharged the day of surgery will not be allowed to drive home.   Please read over the following fact sheets that you were given:   MRSA Information   __X__ Take these medicines the morning of surgery with A SIP OF WATER:    1. NORVASC  2. ATORVASTATIN        IF MEDS 1-3 TAKEN IN THE MORNING TAKE WITH SIP OF WATER  3. ISOSORBIDE  4. OMEPRAZOLE           TAKE NO MATTER WHAT  5.  6.  ____ Fleet Enema (as directed)   __X__ Use CHG Soap as directed  ____ Use inhalers on the day of surgery  ____ Stop metformin 2  days prior to surgery    ____ Take 1/2 of usual insulin dose the night before surgery and none on the morning of surgery.   __X__ Stop Coumadin/Plavix/aspirin on AS DIRECTED  __X__ Stop Anti-inflammatories such as Advil, Aleve, Ibuprofen, Motrin, Naproxen, Naprosyn, Goodies,powder, or aspirin products.  OK to take Tylenol.   __X__ Stop supplements until after surgery.    ____ Bring C-Pap to the hospital.

## 2016-07-29 NOTE — Pre-Procedure Instructions (Signed)
Met B, CBC , & PTT sent to Dr. Krasinski and Anesthesia for review. 

## 2016-07-29 NOTE — Pre-Procedure Instructions (Addendum)
Labs compared with previous 02/27/16 and bun/creat less elevated cleared by dr Alfonso Patten Rosanna Randy 06/29/16

## 2016-07-30 DIAGNOSIS — G8929 Other chronic pain: Secondary | ICD-10-CM | POA: Insufficient documentation

## 2016-07-30 DIAGNOSIS — M25569 Pain in unspecified knee: Secondary | ICD-10-CM | POA: Insufficient documentation

## 2016-07-30 DIAGNOSIS — M25561 Pain in right knee: Secondary | ICD-10-CM | POA: Diagnosis not present

## 2016-07-30 DIAGNOSIS — I25810 Atherosclerosis of coronary artery bypass graft(s) without angina pectoris: Secondary | ICD-10-CM | POA: Diagnosis not present

## 2016-07-30 DIAGNOSIS — I252 Old myocardial infarction: Secondary | ICD-10-CM | POA: Diagnosis not present

## 2016-07-30 DIAGNOSIS — I255 Ischemic cardiomyopathy: Secondary | ICD-10-CM | POA: Diagnosis not present

## 2016-08-04 MED ORDER — CLINDAMYCIN PHOSPHATE 600 MG/50ML IV SOLN
600.0000 mg | Freq: Once | INTRAVENOUS | Status: AC
  Administered 2016-08-05: 600 mg via INTRAVENOUS

## 2016-08-05 ENCOUNTER — Ambulatory Visit: Payer: Medicare Other | Admitting: Registered Nurse

## 2016-08-05 ENCOUNTER — Ambulatory Visit
Admission: RE | Admit: 2016-08-05 | Discharge: 2016-08-05 | Disposition: A | Discharge status: Home / Self Care | Payer: Medicare Other | Source: Ambulatory Visit | Attending: Orthopedic Surgery | Admitting: Orthopedic Surgery

## 2016-08-05 ENCOUNTER — Encounter
Admission: RE | Disposition: A | Discharge status: Home / Self Care | Payer: Self-pay | Source: Ambulatory Visit | Attending: Orthopedic Surgery

## 2016-08-05 DIAGNOSIS — Z79899 Other long term (current) drug therapy: Secondary | ICD-10-CM | POA: Diagnosis not present

## 2016-08-05 DIAGNOSIS — M65861 Other synovitis and tenosynovitis, right lower leg: Secondary | ICD-10-CM | POA: Diagnosis not present

## 2016-08-05 DIAGNOSIS — Z87891 Personal history of nicotine dependence: Secondary | ICD-10-CM | POA: Insufficient documentation

## 2016-08-05 DIAGNOSIS — I10 Essential (primary) hypertension: Secondary | ICD-10-CM | POA: Diagnosis not present

## 2016-08-05 DIAGNOSIS — X58XXXA Exposure to other specified factors, initial encounter: Secondary | ICD-10-CM | POA: Diagnosis not present

## 2016-08-05 DIAGNOSIS — S83231A Complex tear of medial meniscus, current injury, right knee, initial encounter: Secondary | ICD-10-CM | POA: Insufficient documentation

## 2016-08-05 DIAGNOSIS — I2510 Atherosclerotic heart disease of native coronary artery without angina pectoris: Secondary | ICD-10-CM | POA: Insufficient documentation

## 2016-08-05 DIAGNOSIS — I739 Peripheral vascular disease, unspecified: Secondary | ICD-10-CM | POA: Diagnosis not present

## 2016-08-05 DIAGNOSIS — Z8249 Family history of ischemic heart disease and other diseases of the circulatory system: Secondary | ICD-10-CM | POA: Insufficient documentation

## 2016-08-05 DIAGNOSIS — I25810 Atherosclerosis of coronary artery bypass graft(s) without angina pectoris: Secondary | ICD-10-CM | POA: Diagnosis not present

## 2016-08-05 DIAGNOSIS — Y939 Activity, unspecified: Secondary | ICD-10-CM | POA: Insufficient documentation

## 2016-08-05 DIAGNOSIS — E785 Hyperlipidemia, unspecified: Secondary | ICD-10-CM | POA: Diagnosis not present

## 2016-08-05 DIAGNOSIS — Z951 Presence of aortocoronary bypass graft: Secondary | ICD-10-CM | POA: Diagnosis not present

## 2016-08-05 DIAGNOSIS — I252 Old myocardial infarction: Secondary | ICD-10-CM | POA: Insufficient documentation

## 2016-08-05 DIAGNOSIS — Z880 Allergy status to penicillin: Secondary | ICD-10-CM | POA: Insufficient documentation

## 2016-08-05 DIAGNOSIS — M1711 Unilateral primary osteoarthritis, right knee: Secondary | ICD-10-CM | POA: Diagnosis not present

## 2016-08-05 DIAGNOSIS — S83241A Other tear of medial meniscus, current injury, right knee, initial encounter: Secondary | ICD-10-CM | POA: Diagnosis not present

## 2016-08-05 DIAGNOSIS — Z87442 Personal history of urinary calculi: Secondary | ICD-10-CM | POA: Insufficient documentation

## 2016-08-05 DIAGNOSIS — Z7982 Long term (current) use of aspirin: Secondary | ICD-10-CM | POA: Insufficient documentation

## 2016-08-05 DIAGNOSIS — K219 Gastro-esophageal reflux disease without esophagitis: Secondary | ICD-10-CM | POA: Diagnosis not present

## 2016-08-05 DIAGNOSIS — I38 Endocarditis, valve unspecified: Secondary | ICD-10-CM | POA: Diagnosis not present

## 2016-08-05 DIAGNOSIS — M23221 Derangement of posterior horn of medial meniscus due to old tear or injury, right knee: Secondary | ICD-10-CM | POA: Diagnosis present

## 2016-08-05 HISTORY — PX: KNEE ARTHROSCOPY WITH LATERAL RELEASE: SHX5649

## 2016-08-05 HISTORY — PX: KNEE ARTHROSCOPY WITH MEDIAL MENISECTOMY: SHX5651

## 2016-08-05 HISTORY — PX: KNEE ARTHROSCOPY: SHX127

## 2016-08-05 HISTORY — PX: SYNOVECTOMY: SHX5180

## 2016-08-05 SURGERY — ARTHROSCOPY, KNEE
Anesthesia: General | Site: Knee | Laterality: Right

## 2016-08-05 MED ORDER — MIDAZOLAM HCL 2 MG/2ML IJ SOLN
INTRAMUSCULAR | Status: DC | PRN
  Administered 2016-08-05: 2 mg via INTRAVENOUS

## 2016-08-05 MED ORDER — ONDANSETRON HCL 4 MG/2ML IJ SOLN
INTRAMUSCULAR | Status: AC
  Filled 2016-08-05: qty 2

## 2016-08-05 MED ORDER — LIDOCAINE HCL (PF) 2 % IJ SOLN
INTRAMUSCULAR | Status: AC
  Filled 2016-08-05: qty 2

## 2016-08-05 MED ORDER — CHLORHEXIDINE GLUCONATE CLOTH 2 % EX PADS: 6.0000 | MEDICATED_PAD | Freq: Once | CUTANEOUS | Status: DC

## 2016-08-05 MED ORDER — EPHEDRINE SULFATE 50 MG/ML IJ SOLN
INTRAMUSCULAR | Status: DC | PRN
  Administered 2016-08-05 (×2): 10 mg via INTRAVENOUS
  Administered 2016-08-05: 5 mg via INTRAVENOUS

## 2016-08-05 MED ORDER — FENTANYL CITRATE (PF) 100 MCG/2ML IJ SOLN
INTRAMUSCULAR | Status: AC
  Filled 2016-08-05: qty 2

## 2016-08-05 MED ORDER — LIDOCAINE HCL 1 % IJ SOLN
INTRAMUSCULAR | Status: DC | PRN
  Administered 2016-08-05: 20 mL
  Administered 2016-08-05: 10 mL

## 2016-08-05 MED ORDER — BUPIVACAINE HCL (PF) 0.25 % IJ SOLN
INTRAMUSCULAR | Status: AC
  Filled 2016-08-05: qty 30

## 2016-08-05 MED ORDER — ONDANSETRON HCL 4 MG/2ML IJ SOLN: 4.0000 mg | Freq: Once | INTRAMUSCULAR | Status: DC | PRN

## 2016-08-05 MED ORDER — ACETAMINOPHEN 10 MG/ML IV SOLN
INTRAVENOUS | Status: AC
  Filled 2016-08-05: qty 100

## 2016-08-05 MED ORDER — PROPOFOL 10 MG/ML IV BOLUS
INTRAVENOUS | Status: DC | PRN
  Administered 2016-08-05: 30 mg via INTRAVENOUS
  Administered 2016-08-05: 150 mg via INTRAVENOUS

## 2016-08-05 MED ORDER — MIDAZOLAM HCL 2 MG/2ML IJ SOLN
INTRAMUSCULAR | Status: AC
  Filled 2016-08-05: qty 2

## 2016-08-05 MED ORDER — PHENYLEPHRINE HCL 10 MG/ML IJ SOLN
INTRAMUSCULAR | Status: AC
  Filled 2016-08-05: qty 1

## 2016-08-05 MED ORDER — PHENYLEPHRINE HCL 10 MG/ML IJ SOLN
INTRAMUSCULAR | Status: DC | PRN
  Administered 2016-08-05 (×2): 200 ug via INTRAVENOUS
  Administered 2016-08-05 (×4): 100 ug via INTRAVENOUS

## 2016-08-05 MED ORDER — EPHEDRINE SULFATE 50 MG/ML IJ SOLN
INTRAMUSCULAR | Status: AC
  Filled 2016-08-05: qty 1

## 2016-08-05 MED ORDER — LIDOCAINE HCL (PF) 1 % IJ SOLN
INTRAMUSCULAR | Status: AC
  Filled 2016-08-05: qty 30

## 2016-08-05 MED ORDER — DEXAMETHASONE SODIUM PHOSPHATE 10 MG/ML IJ SOLN
INTRAMUSCULAR | Status: AC
  Filled 2016-08-05: qty 1

## 2016-08-05 MED ORDER — FENTANYL CITRATE (PF) 100 MCG/2ML IJ SOLN
INTRAMUSCULAR | Status: DC | PRN
  Administered 2016-08-05 (×2): 25 ug via INTRAVENOUS

## 2016-08-05 MED ORDER — ACETAMINOPHEN 10 MG/ML IV SOLN
INTRAVENOUS | Status: DC | PRN
  Administered 2016-08-05: 1000 mg via INTRAVENOUS

## 2016-08-05 MED ORDER — PROPOFOL 10 MG/ML IV BOLUS
INTRAVENOUS | Status: AC
  Filled 2016-08-05: qty 20

## 2016-08-05 MED ORDER — FENTANYL CITRATE (PF) 100 MCG/2ML IJ SOLN: 25.0000 ug | INTRAMUSCULAR | Status: DC | PRN

## 2016-08-05 MED ORDER — LACTATED RINGERS IV SOLN
INTRAVENOUS | Status: DC
  Administered 2016-08-05: 1000 mL via INTRAVENOUS

## 2016-08-05 MED ORDER — ONDANSETRON HCL 4 MG/2ML IJ SOLN
INTRAMUSCULAR | Status: DC | PRN
  Administered 2016-08-05: 4 mg via INTRAVENOUS

## 2016-08-05 MED ORDER — DEXAMETHASONE SODIUM PHOSPHATE 10 MG/ML IJ SOLN
INTRAMUSCULAR | Status: DC | PRN
  Administered 2016-08-05: 5 mg via INTRAVENOUS

## 2016-08-05 MED ORDER — ONDANSETRON HCL 4 MG PO TABS: 4.0000 mg | ORAL_TABLET | Freq: Three times a day (TID) | ORAL | 0 refills | Status: DC | PRN

## 2016-08-05 MED ORDER — GLYCOPYRROLATE 0.2 MG/ML IJ SOLN
INTRAMUSCULAR | Status: AC
  Filled 2016-08-05: qty 1

## 2016-08-05 MED ORDER — SEVOFLURANE IN SOLN
RESPIRATORY_TRACT | Status: AC
  Filled 2016-08-05: qty 250

## 2016-08-05 MED ORDER — BUPIVACAINE-EPINEPHRINE (PF) 0.25% -1:200000 IJ SOLN
INTRAMUSCULAR | Status: AC
  Filled 2016-08-05: qty 30

## 2016-08-05 MED ORDER — LIDOCAINE HCL (CARDIAC) 20 MG/ML IV SOLN
INTRAVENOUS | Status: DC | PRN
  Administered 2016-08-05: 80 mg via INTRAVENOUS

## 2016-08-05 MED ORDER — CLINDAMYCIN PHOSPHATE 600 MG/50ML IV SOLN
INTRAVENOUS | Status: AC
  Filled 2016-08-05: qty 50

## 2016-08-05 MED ORDER — SUCCINYLCHOLINE CHLORIDE 20 MG/ML IJ SOLN
INTRAMUSCULAR | Status: AC
  Filled 2016-08-05: qty 1

## 2016-08-05 MED ORDER — HYDROCODONE-ACETAMINOPHEN 5-325 MG PO TABS: 1.0000 | ORAL_TABLET | ORAL | 0 refills | Status: DC | PRN

## 2016-08-05 MED ORDER — GLYCOPYRROLATE 0.2 MG/ML IJ SOLN
INTRAMUSCULAR | Status: DC | PRN
  Administered 2016-08-05: 0.2 mg via INTRAVENOUS

## 2016-08-05 MED ORDER — ASPIRIN EC 325 MG PO TBEC: 325.0000 mg | DELAYED_RELEASE_TABLET | Freq: Every day | ORAL | 0 refills | Status: DC

## 2016-08-05 MED ORDER — SODIUM CHLORIDE 0.9 % IJ SOLN
INTRAMUSCULAR | Status: AC
Start: 2016-08-05 — End: ?
  Filled 2016-08-05: qty 10

## 2016-08-05 SURGICAL SUPPLY — 35 items
BUR RADIUS 3.5 (BURR) ×4 IMPLANT
BUR RADIUS 4.0X18.5 (BURR) ×4 IMPLANT
CLOSURE WOUND 1/2 X4 (GAUZE/BANDAGES/DRESSINGS) ×1
COOLER POLAR GLACIER W/PUMP (MISCELLANEOUS) IMPLANT
CUFF TOURN 24 STER (MISCELLANEOUS) ×4 IMPLANT
CUFF TOURN 30 STER DUAL PORT (MISCELLANEOUS) IMPLANT
DRAPE IMP U-DRAPE 54X76 (DRAPES) ×4 IMPLANT
DURAPREP 26ML APPLICATOR (WOUND CARE) ×12 IMPLANT
GAUZE PETRO XEROFOAM 1X8 (MISCELLANEOUS) ×4 IMPLANT
GAUZE SPONGE 4X4 12PLY STRL (GAUZE/BANDAGES/DRESSINGS) ×4 IMPLANT
GLOVE BIOGEL PI IND STRL 9 (GLOVE) ×2 IMPLANT
GLOVE BIOGEL PI INDICATOR 9 (GLOVE) ×2
GLOVE SURG 9.0 ORTHO LTXF (GLOVE) ×8 IMPLANT
GOWN STRL REUS TWL 2XL XL LVL4 (GOWN DISPOSABLE) ×4 IMPLANT
GOWN STRL REUS W/ TWL LRG LVL3 (GOWN DISPOSABLE) ×2 IMPLANT
GOWN STRL REUS W/TWL LRG LVL3 (GOWN DISPOSABLE) ×2
IV LACTATED RINGER IRRG 3000ML (IV SOLUTION) ×12
IV LR IRRIG 3000ML ARTHROMATIC (IV SOLUTION) ×12 IMPLANT
KIT RM TURNOVER STRD PROC AR (KITS) ×4 IMPLANT
MANIFOLD NEPTUNE II (INSTRUMENTS) ×4 IMPLANT
PACK ARTHROSCOPY KNEE (MISCELLANEOUS) ×4 IMPLANT
PAD ABD DERMACEA PRESS 5X9 (GAUZE/BANDAGES/DRESSINGS) ×8 IMPLANT
PAD WRAPON POLAR KNEE (MISCELLANEOUS) IMPLANT
SET TUBE SUCT SHAVER OUTFL 24K (TUBING) ×4 IMPLANT
SET TUBE TIP INTRA-ARTICULAR (MISCELLANEOUS) ×4 IMPLANT
SOL PREP PVP 2OZ (MISCELLANEOUS)
SOLUTION PREP PVP 2OZ (MISCELLANEOUS) IMPLANT
STRIP CLOSURE SKIN 1/2X4 (GAUZE/BANDAGES/DRESSINGS) ×3 IMPLANT
SUT ETHILON 4-0 (SUTURE) ×2
SUT ETHILON 4-0 FS2 18XMFL BLK (SUTURE) ×2
SUTURE ETHLN 4-0 FS2 18XMF BLK (SUTURE) ×2 IMPLANT
TUBING ARTHRO INFLOW-ONLY STRL (TUBING) ×4 IMPLANT
WAND HAND CNTRL MULTIVAC 50 (MISCELLANEOUS) IMPLANT
WAND HAND CNTRL MULTIVAC 90 (MISCELLANEOUS) IMPLANT
WRAPON POLAR PAD KNEE (MISCELLANEOUS)

## 2016-08-05 NOTE — Progress Notes (Signed)
Ace wrap to righr knee clean and dry   Elevated on pillows and capillary refill positive to right

## 2016-08-05 NOTE — Op Note (Signed)
PATIENT:  Austin [name]  PRE-OPERATIVE DIAGNOSIS:  TEAR OF MEDIAL MENISCUS, RIGHT KNEE  POST-OPERATIVE DIAGNOSIS:  Same  PROCEDURE:  RIGHT KNEE ARTHROSCOPY WITH  Partial MEDIAL MENISECTOMY, synovectomy and lateral release  SURGEON:  Thornton Park, MD  ANESTHESIA:   General  PREOPERATIVE INDICATIONS:  Austin [name]  83 y.o. male with a diagnosis of TEAR OF MEDIAL MENISCUS of the right knee who failed conservative management and elected for surgical management.  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  The risks benefits and alternatives were discussed with the patient preoperatively including the risks of infection, bleeding, nerve injury, knee stiffness, persistent pain, osteoarthritis and the need for further surgery. Medical  risks include DVT and pulmonary embolism, myocardial infarction, stroke, pneumonia, respiratory failure and death. The patient understood these risks and wished to proceed.  OPERATIVE FINDINGS: Diffuse osteoarthritis in the medial femoral condyle and trochlea of the femur.  Patient had an extensive complex tear of the posterior horn of the medial meniscus.  OPERATIVE PROCEDURE: Patient was met in the preoperative area. The operative extremity was signed with the word yes and my initials according the hospital's correct site of surgery protocol.  The patient was brought to the operating room where they was placed supine on the operative table. General anesthesia was administered. The patient was prepped and draped in a sterile fashion.  A timeout was performed to verify the patient's name, date of birth, medical record number, correct site of surgery correct procedure to be performed. It was also used to verify the patient received antibiotics that all appropriate instruments, and radiographic studies were available in the room. Once all in attendance were in  agreement, the case began.  Proposed arthroscopy incisions were drawn out with a surgical marker. These were pre-injected with 1% lidocaine plain. An 11 blade was used to establish an inferior lateral and inferomedial portals. The inferomedial portal was created using a 18-gauge spinal needle under direct visualization.  Patient had extensive synovitis and adhesions particularly in the lateral gutter.  These adhesions made it impossible to place a scope between the patella and the trochlea initially. Therefore an extensive synovectomy, lysis of adhesions and lateral release was performed using a 4.0 resector shaver blade and ablation wand.  A full diagnostic examination of the knee was then performed including the suprapatellar pouch, patellofemoral joint, medial lateral compartments as well as the medial lateral gutters, the intercondylar notch in the posterior knee.  Patient had the complex meniscal tear involving the posterior horn treated with a 4-0 resector shaver blade and straight duckbill basket. The meniscus was debrided until a stable rim was achieved. A chondroplasty of the medial femoral condyle, trochlea and was also performed using a 4-0 resector shaver blade.   The knee was then copiously lavaged. All arthroscopic instruments were removed. The 2 arthroscopy portals were closed with 4-0 nylon. Steri-Strips were applied along with a dry sterile and compressive dressing. Patient was injected with quarter percent Marcaine plain 20 cc for postop analgesia.   The patient was brought to the PACU in stable condition. I was scrubbed and present for the entire case and all sharp and instrument counts were correct at the conclusion the case. I spoke with the patient's wife postoperatively to let her know the case was performed without complication and the patient was stable in the recovery room.    Timoteo Gaul, MD

## 2016-08-05 NOTE — Anesthesia Procedure Notes (Signed)
Procedure Name: LMA Insertion Date/Time: 08/05/2016 8:02 AM Performed by: Doreen Salvage Pre-anesthesia Checklist: Patient identified, Patient being monitored, Timeout performed, Emergency Drugs available and Suction available Patient Re-evaluated:Patient Re-evaluated prior to inductionOxygen Delivery Method: Circle system utilized Preoxygenation: Pre-oxygenation with 100% oxygen Intubation Type: IV induction Ventilation: Mask ventilation without difficulty LMA: LMA inserted LMA Size: 4.0 Tube type: Oral Number of attempts: 1 Placement Confirmation: positive ETCO2 and breath sounds checked- equal and bilateral Tube secured with: Tape Dental Injury: Teeth and Oropharynx as per pre-operative assessment

## 2016-08-05 NOTE — Anesthesia Post-op Follow-up Note (Cosign Needed)
Anesthesia QCDR form completed.        

## 2016-08-05 NOTE — Anesthesia Preprocedure Evaluation (Signed)
Anesthesia Evaluation  Patient identified by MRN, date of birth, ID band Patient awake  General Assessment Comment:He is now off plavix, on ASA only, and this has been held for several days.  History of Anesthesia Complications Negative for: history of anesthetic complications  Airway Mallampati: III  TM Distance: >3 FB Neck ROM: Full    Dental  (+) Teeth Intact, Caps, Missing   Pulmonary neg pulmonary ROS, former smoker,           Cardiovascular Exercise Tolerance: Good hypertension, Pt. on medications and Pt. on home beta blockers (-) angina+ CAD, + Past MI, + CABG and + Peripheral Vascular Disease  (-) Cardiac Stents + dysrhythmias II+ Valvular Problems/Murmurs MR      Neuro/Psych negative neurological ROS  negative psych ROS   GI/Hepatic Neg liver ROS, GERD  Medicated,  Endo/Other  negative endocrine ROS  Renal/GU Renal disease (kidney stones)  negative genitourinary   Musculoskeletal   Abdominal   Peds  Hematology  (+) Blood dyscrasia, anemia ,   Anesthesia Other Findings Past Medical History: No date: Allergy No date: Arthritis No date: Coronary artery disease No date: GERD (gastroesophageal reflux disease) No date: History of kidney stones No date: HLD (hyperlipidemia) No date: Hypertension 2011: Myocardial infarction No date: Peripheral vascular disease ([place]) No date: VHD (valvular heart disease)   Reproductive/Obstetrics negative OB ROS                             Anesthesia Physical  Anesthesia Plan  ASA: III  Anesthesia Plan: General   Post-op Pain Management:    Induction: Intravenous  Airway Management Planned: Nasal Cannula  Additional Equipment:   Intra-op Plan:   Post-operative Plan:   Informed Consent: I have reviewed the patients History and Physical, chart, labs and discussed the procedure including the risks, benefits and alternatives for the  proposed anesthesia with the patient or authorized representative who has indicated his/her understanding and acceptance.     Plan Discussed with: CRNA  Anesthesia Plan Comments:         Anesthesia Quick Evaluation

## 2016-08-05 NOTE — Transfer of Care (Signed)
Immediate Anesthesia Transfer of Care Note  Patient: Avis Epley  Procedure(s) Performed: Procedure(s) with comments: ARTHROSCOPY KNEE (Right) KNEE ARTHROSCOPY WITH MEDIAL MENISECTOMY (Right) - Partial SYNOVECTOMY (Right) - arthroscopic extensive synovectomy KNEE ARTHROSCOPY WITH LATERAL RELEASE (Right)  Patient Location: PACU  Anesthesia Type:General  Level of Consciousness: sedated  Airway & Oxygen Therapy: Patient Spontanous Breathing and Patient connected to face mask oxygen  Post-op Assessment: Report given to RN and Post -op Vital signs reviewed and stable  Post vital signs: Reviewed and stable  Last Vitals:  Vitals:   08/05/16 0607 08/05/16 0936  BP: (!) 147/69 (!) 103/53  Pulse: 81 77  Resp: 12 17  Temp: 36.3 C 35.5 C    Complications: No apparent anesthesia complications

## 2016-08-05 NOTE — H&P (Signed)
PREOPERATIVE H&P  Chief Complaint: Y19.509T Complex tear of medial mensc, current injury, r knee, init  HPI: Austin [name] is a 83 y.o. male who presents for preoperative history and physical with a diagnosis of S83.231A Complex tear of medial mensc, current injury, r knee, init. Symptoms are rated as moderate to severe, and have been worsening.  This is significantly impairing activities of daily living.  He has elected for surgical management.   Past Medical History:  Diagnosis Date  . Allergy   . Arthritis   . Coronary artery disease   . GERD (gastroesophageal reflux disease)   . History of kidney stones   . HLD (hyperlipidemia)   . Hypertension   . Myocardial infarction 2011  . Peripheral vascular disease ([place])   . VHD (valvular heart disease)    Past Surgical History:  Procedure Laterality Date  . CARDIAC CATHETERIZATION  2007  . CARDIAC CATHETERIZATION N/A 04/17/2015   Procedure: Left Heart Cath and Coronary Angiography;  Surgeon: Corey Skains, MD;  Location: [place] [facility];  Service: Cardiovascular;  Laterality: N/A;  . CARPAL TUNNEL RELEASE Bilateral    right 06/09/08 left 05/03/09  . COLONOSCOPY N/A 10/02/2014   Procedure: COLONOSCOPY;  Surgeon: Manya Silvas, MD;  Location: [facility];  Service: Endoscopy;  Laterality: N/A;  . CORONARY ARTERY BYPASS GRAFT  2007  . HEMORROIDECTOMY    . LUMBAR EPIDURAL INJECTION  2011  . MOUTH SURGERY    . SHOULDER SURGERY Right   . TONSILLECTOMY     Social History   Social History  . Marital status: Married    Spouse name: Blanch Media  . Number of children: 1  . Years of education: N/A   Occupational History  .  Retired   Social History Main Topics  . Smoking status: Former Smoker    Years: 15.00    Types: Cigarettes  . Smokeless tobacco: Never Used  . Alcohol use Yes     Comment: 1-2 glasses of wine a night  . Drug use: No  . Sexual activity: No   Other Topics Concern  . None   Social History Narrative   . None   Family History  Problem Relation Age of Onset  . Heart disease Mother   . Heart disease Father   . Heart disease Brother   . Heart disease Brother   . Heart disease Brother   . Heart disease Brother    Allergies  Allergen Reactions  . Penicillins Itching, Swelling and Rash    Has patient had a PCN reaction causing immediate rash, facial/tongue/throat swelling, SOB or lightheadedness with hypotension: Yes Has patient had a PCN reaction causing severe rash involving mucus membranes or skin necrosis: No Has patient had a PCN reaction that required hospitalization No Has patient had a PCN reaction occurring within the last 10 years: No If all of the above answers are "NO", then may proceed with Cephalosporin use.    Prior to Admission medications   Medication Sig Start Date End Date Taking? Authorizing Provider  amLODipine (NORVASC) 5 MG tablet Take 5 mg by mouth daily.   Yes Historical Provider, MD  aspirin 81 MG tablet Take 81 mg by mouth daily.  08/12/11  Yes Historical Provider, MD  atorvastatin (LIPITOR) 20 MG tablet Take 20 mg by mouth every other day.  04/24/14  Yes Historical Provider, MD  Biotin 5000 MCG CAPS Take 5,000 mcg by mouth daily.    Yes Historical Provider, MD  Cholecalciferol (VITAMIN  D3) 2000 UNITS capsule Take 2,000 Units by mouth 2 (two) times daily.    Yes Historical Provider, MD  COENZYME Q-10 PO Take 200 mg by mouth daily.  08/12/11  Yes Historical Provider, MD  dorzolamide-timolol (COSOPT) 22.3-6.8 MG/ML ophthalmic solution Place 1 drop into both eyes 2 (two) times daily.  07/03/15  Yes Historical Provider, MD  glucosamine-chondroitin 500-400 MG tablet Take 1 tablet by mouth 2 (two) times daily.   Yes Historical Provider, MD  isosorbide mononitrate (IMDUR) 60 MG 24 hr tablet Take 60 mg by mouth daily.  06/09/16  Yes Historical Provider, MD  naproxen sodium (ANAPROX) 220 MG tablet Take 220-440 mg by mouth daily as needed (pain).   Yes Historical Provider,  MD  nitroGLYCERIN (NITROSTAT) 0.4 MG SL tablet Place 0.4 mg under the tongue every 5 (five) minutes as needed for chest pain.  02/25/11  Yes Historical Provider, MD  OMEGA-3 FATTY ACIDS PO Take 1,200 mcg by mouth 2 (two) times daily.  02/25/11  Yes Historical Provider, MD  omeprazole (PRILOSEC) 20 MG capsule Take 20 mg by mouth daily. Reported on 08/21/2015 07/28/15  Yes Historical Provider, MD  Polyethyl Glycol-Propyl Glycol (SYSTANE OP) Apply 1 drop to eye daily as needed (Dry eyes).   Yes Historical Provider, MD  tamsulosin (FLOMAX) 0.4 MG CAPS capsule TAKE 1 CAPSULE BY MOUTH EVERY DAY Patient taking differently: in the evening 11/26/15  Yes Richard Maceo Pro., MD  Travoprost, BAK Free, (TRAVATAN Z) 0.004 % SOLN ophthalmic solution Place 1 drop into both eyes at bedtime.  08/12/11  Yes Historical Provider, MD  valsartan-hydrochlorothiazide (DIOVAN HCT) 320-25 MG per tablet Take 1 tablet by mouth daily.    Yes Historical Provider, MD  vitamin B-12 (CYANOCOBALAMIN) 1000 MCG tablet Take 1,000 mcg by mouth daily.   Yes Historical Provider, MD     Positive ROS: All other systems have been reviewed and were otherwise negative with the exception of those mentioned in the HPI and as above.  Physical Exam: General: Alert, no acute distress Cardiovascular: Regular rate and rhythm, no murmurs rubs or gallops.  No pedal edema Respiratory: Clear to auscultation bilaterally, no wheezes rales or rhonchi. No cyanosis, no use of accessory musculature GI: No organomegaly, abdomen is soft and non-tender nondistended with positive bowel sounds. Skin: Skin intact, no lesions within the operative field. Neurologic: Sensation intact distally Psychiatric: Patient is competent for consent with normal mood and affect Lymphatic: No  cervical lymphadenopathy  MUSCULOSKELETAL: Right knee:  Range of motion from 0-120.  He has tenderness over the medial joint line. He has a positive McMurray's test. Patient is 5 out of 5  strength in all muscle groups, intact sensation light touch in palpable pedal pulses. No ligamentous laxity.  Assessment: S83.231A Complex tear of medial mensc, current injury, r knee, init  Plan: Plan for Procedure(s): RIGHT KNEE ARTHROSCOPIC PARTIAL MEDIAL MENISCECTOMY  I discussed the patient  In detail the operation in the postoperative course with the patientin my office and again today with his wife at the bedside in the preoperative area.  I discussed the risks and benefits of surgerywith the patient in my office prior to surgery. He understands the risks include but are not limited to infection, bleeding requiring blood transfusion, nerve or blood vessel injury, joint stiffness or loss of motion, persistent pain, weakness or instability, malunion, nonunion and hardware failure and the need for further surgery. Medical risks include but are not limited to DVT and pulmonary embolism, myocardial infarction, stroke,  pneumonia, respiratory failure and death. Patient understood these risks and wished to proceed.   Answered all questions by the patient and his wife.  Thornton Park, MD   08/05/2016 7:42 AM

## 2016-08-05 NOTE — Progress Notes (Signed)
Applied ice pack to right knee

## 2016-08-05 NOTE — Op Note (Signed)
Patient has small red area noted below the right knee, red not bleeding or open.

## 2016-08-08 NOTE — Anesthesia Postprocedure Evaluation (Signed)
Anesthesia Post Note  Patient: Austin [name]  Procedure(s) Performed: Procedure(s) (LRB): ARTHROSCOPY KNEE (Right) KNEE ARTHROSCOPY WITH MEDIAL MENISECTOMY (Right) SYNOVECTOMY (Right) KNEE ARTHROSCOPY WITH LATERAL RELEASE (Right)  Patient location during evaluation: PACU Anesthesia Type: General Level of consciousness: awake and alert Pain management: pain level controlled Vital Signs Assessment: post-procedure vital signs reviewed and stable Respiratory status: spontaneous breathing, nonlabored ventilation, respiratory function stable and patient connected to nasal cannula oxygen Cardiovascular status: blood pressure returned to baseline and stable Postop Assessment: no signs of nausea or vomiting Anesthetic complications: no     Last Vitals:  Vitals:   08/05/16 1023 08/05/16 1100  BP: (!) 112/48 (!) 123/56  Pulse: 70 75  Resp: 16   Temp: 36.4 C     Last Pain:  Vitals:   08/06/16 0844  TempSrc:   PainSc: 0-No pain                 Martha Clan

## 2016-08-14 DIAGNOSIS — M6281 Muscle weakness (generalized): Secondary | ICD-10-CM | POA: Diagnosis not present

## 2016-08-21 DIAGNOSIS — R609 Edema, unspecified: Secondary | ICD-10-CM | POA: Diagnosis not present

## 2016-08-21 DIAGNOSIS — M25561 Pain in right knee: Secondary | ICD-10-CM | POA: Diagnosis not present

## 2016-08-21 DIAGNOSIS — M25661 Stiffness of right knee, not elsewhere classified: Secondary | ICD-10-CM | POA: Diagnosis not present

## 2016-08-29 DIAGNOSIS — S83231D Complex tear of medial meniscus, current injury, right knee, subsequent encounter: Secondary | ICD-10-CM | POA: Diagnosis not present

## 2016-08-29 DIAGNOSIS — R609 Edema, unspecified: Secondary | ICD-10-CM | POA: Diagnosis not present

## 2016-09-03 DIAGNOSIS — R600 Localized edema: Secondary | ICD-10-CM | POA: Diagnosis not present

## 2016-09-03 DIAGNOSIS — M25561 Pain in right knee: Secondary | ICD-10-CM | POA: Diagnosis not present

## 2016-09-03 DIAGNOSIS — M25661 Stiffness of right knee, not elsewhere classified: Secondary | ICD-10-CM | POA: Diagnosis not present

## 2016-09-09 DIAGNOSIS — M25561 Pain in right knee: Secondary | ICD-10-CM | POA: Diagnosis not present

## 2016-09-11 DIAGNOSIS — M25561 Pain in right knee: Secondary | ICD-10-CM | POA: Diagnosis not present

## 2016-09-11 DIAGNOSIS — R609 Edema, unspecified: Secondary | ICD-10-CM | POA: Diagnosis not present

## 2016-09-11 DIAGNOSIS — M25661 Stiffness of right knee, not elsewhere classified: Secondary | ICD-10-CM | POA: Diagnosis not present

## 2016-09-12 ENCOUNTER — Other Ambulatory Visit: Payer: Self-pay

## 2016-09-12 NOTE — Patient Outreach (Signed)
Per [name]  He said doctor told him to cut back on atorvastatin 20 mg ,because he was having side effects from the medication,doctor told him to take the medication every other day and sometimes every third day depending on how he felt.pt also said he has a pill box and his wife help him out .

## 2016-09-16 DIAGNOSIS — M25561 Pain in right knee: Secondary | ICD-10-CM | POA: Diagnosis not present

## 2016-09-16 DIAGNOSIS — M25661 Stiffness of right knee, not elsewhere classified: Secondary | ICD-10-CM | POA: Diagnosis not present

## 2016-09-16 DIAGNOSIS — R609 Edema, unspecified: Secondary | ICD-10-CM | POA: Diagnosis not present

## 2016-09-19 DIAGNOSIS — M25561 Pain in right knee: Secondary | ICD-10-CM | POA: Diagnosis not present

## 2016-09-19 DIAGNOSIS — M25661 Stiffness of right knee, not elsewhere classified: Secondary | ICD-10-CM | POA: Diagnosis not present

## 2016-09-24 DIAGNOSIS — M25661 Stiffness of right knee, not elsewhere classified: Secondary | ICD-10-CM | POA: Diagnosis not present

## 2016-09-24 DIAGNOSIS — R609 Edema, unspecified: Secondary | ICD-10-CM | POA: Diagnosis not present

## 2016-09-24 DIAGNOSIS — M25561 Pain in right knee: Secondary | ICD-10-CM | POA: Diagnosis not present

## 2016-09-29 ENCOUNTER — Encounter: Payer: Self-pay | Admitting: Family Medicine

## 2016-09-29 ENCOUNTER — Ambulatory Visit (INDEPENDENT_AMBULATORY_CARE_PROVIDER_SITE_OTHER): Payer: Medicare Other | Admitting: Family Medicine

## 2016-09-29 VITALS — BP 128/64 | HR 58 | Temp 98.4°F | Resp 16 | Ht 67.0 in | Wt 174.0 lb

## 2016-09-29 DIAGNOSIS — I10 Essential (primary) hypertension: Secondary | ICD-10-CM | POA: Diagnosis not present

## 2016-09-29 DIAGNOSIS — R7303 Prediabetes: Secondary | ICD-10-CM

## 2016-09-29 DIAGNOSIS — M1990 Unspecified osteoarthritis, unspecified site: Secondary | ICD-10-CM | POA: Diagnosis not present

## 2016-09-29 LAB — POCT GLYCOSYLATED HEMOGLOBIN (HGB A1C)
Est. average glucose Bld gHb Est-mCnc: 117
Hemoglobin A1C: 5.7

## 2016-09-29 NOTE — Progress Notes (Signed)
Patient: Austin [name] Male    DOB: 18-Jul-1928   83 y.o.   MRN: [id number] Visit Date: 09/29/2016  Today's Provider: Wilhemena Durie, MD   Chief Complaint  Patient presents with  . Hypertension  . Hyperglycemia   Subjective:    HPI  Hyperglycemia, Follow-up:   Lab Results  Component Value Date   HGBA1C 5.7 09/29/2016   HGBA1C 5.4 02/27/2016   HGBA1C 6.2 08/27/2015   GLUCOSE 162 (H) 07/28/2016   GLUCOSE 113 (H) 02/27/2016   GLUCOSE 115 (H) 01/08/2015    Last seen for for this 6 months ago.  Management since then includes no changes. Current symptoms include none and have been stable.  Weight trend: stable Prior visit with dietician: no Current diet: well balanced Current exercise: walking  Pertinent Labs:    Component Value Date/Time   CHOL 147 02/27/2016 1031   TRIG 76 02/27/2016 1031   CHOLHDL 3.0 02/27/2016 1031   CREATININE 1.51 (H) 07/28/2016 1146   CREATININE 1.50 (H) 05/09/2013 0912    Wt Readings from Last 3 Encounters:  09/29/16 174 lb (78.9 kg)  08/05/16 170 lb (77.1 kg)  07/28/16 170 lb (77.1 kg)    Hypertension, follow-up:  BP Readings from Last 3 Encounters:  09/29/16 128/64  08/05/16 (!) 123/56  07/28/16 (!) 131/53    He was last seen for hypertension 6 months ago.  BP at that visit was 122/56. Management since that visit includes no changes. He reports good compliance with treatment. He is not having side effects.  He is exercising. He is adherent to low salt diet.   Outside blood pressures are checked daily. He is experiencing none.  Patient denies exertional chest pressure/discomfort, lower extremity edema and palpitations.      Weight trend: stable Wt Readings from Last 3 Encounters:  09/29/16 174 lb (78.9 kg)  08/05/16 170 lb (77.1 kg)  07/28/16 170 lb (77.1 kg)    Current diet: well balanced     Allergies  Allergen Reactions  . Penicillins Itching, Swelling and Rash    Has patient had a PCN reaction  causing immediate rash, facial/tongue/throat swelling, SOB or lightheadedness with hypotension: Yes Has patient had a PCN reaction causing severe rash involving mucus membranes or skin necrosis: No Has patient had a PCN reaction that required hospitalization No Has patient had a PCN reaction occurring within the last 10 years: No If all of the above answers are "NO", then may proceed with Cephalosporin use.      Current Outpatient Prescriptions:  .  amLODipine (NORVASC) 5 MG tablet, Take 5 mg by mouth daily., Disp: , Rfl:  .  aspirin EC 325 MG tablet, Take 1 tablet (325 mg total) by mouth daily., Disp: 30 tablet, Rfl: 0 .  atorvastatin (LIPITOR) 20 MG tablet, Take 20 mg by mouth every other day. , Disp: , Rfl:  .  Biotin 5000 MCG CAPS, Take 5,000 mcg by mouth daily. , Disp: , Rfl:  .  Cholecalciferol (VITAMIN D3) 2000 UNITS capsule, Take 2,000 Units by mouth 2 (two) times daily. , Disp: , Rfl:  .  COENZYME Q-10 PO, Take 200 mg by mouth daily. , Disp: , Rfl:  .  dorzolamide-timolol (COSOPT) 22.3-6.8 MG/ML ophthalmic solution, Place 1 drop into both eyes 2 (two) times daily. , Disp: , Rfl:  .  glucosamine-chondroitin 500-400 MG tablet, Take 1 tablet by mouth 2 (two) times daily., Disp: , Rfl:  .  HYDROcodone-acetaminophen (NORCO) 5-325  MG tablet, Take 1 tablet by mouth every 4 (four) hours as needed for moderate pain. MAXIMUM TOTAL ACETAMINOPHEN DOSE IS 4000 MG PER DAY, Disp: 40 tablet, Rfl: 0 .  isosorbide mononitrate (IMDUR) 60 MG 24 hr tablet, Take 60 mg by mouth daily. , Disp: , Rfl:  .  naproxen sodium (ANAPROX) 220 MG tablet, Take 220-440 mg by mouth daily as needed (pain)., Disp: , Rfl:  .  nitroGLYCERIN (NITROSTAT) 0.4 MG SL tablet, Place 0.4 mg under the tongue every 5 (five) minutes as needed for chest pain. , Disp: , Rfl:  .  OMEGA-3 FATTY ACIDS PO, Take 1,200 mcg by mouth 2 (two) times daily. , Disp: , Rfl:  .  omeprazole (PRILOSEC) 20 MG capsule, Take 20 mg by mouth daily. Reported  on 08/21/2015, Disp: , Rfl:  .  ondansetron (ZOFRAN) 4 MG tablet, Take 1 tablet (4 mg total) by mouth every 8 (eight) hours as needed for nausea or vomiting., Disp: 30 tablet, Rfl: 0 .  Polyethyl Glycol-Propyl Glycol (SYSTANE OP), Apply 1 drop to eye daily as needed (Dry eyes)., Disp: , Rfl:  .  tamsulosin (FLOMAX) 0.4 MG CAPS capsule, TAKE 1 CAPSULE BY MOUTH EVERY DAY (Patient taking differently: in the evening), Disp: 30 capsule, Rfl: 12 .  Travoprost, BAK Free, (TRAVATAN Z) 0.004 % SOLN ophthalmic solution, Place 1 drop into both eyes at bedtime. , Disp: , Rfl:  .  valsartan-hydrochlorothiazide (DIOVAN HCT) 320-25 MG per tablet, Take 1 tablet by mouth daily. , Disp: , Rfl:  .  vitamin B-12 (CYANOCOBALAMIN) 1000 MCG tablet, Take 1,000 mcg by mouth daily., Disp: , Rfl:   Review of Systems  Constitutional: Negative.   Eyes: Negative.   Respiratory: Negative.   Cardiovascular: Negative.   Gastrointestinal: Negative.   Endocrine: Negative.   Musculoskeletal: Negative.   Allergic/Immunologic: Negative.   Neurological: Negative.   Hematological: Negative.   Psychiatric/Behavioral: Negative.     Social History  Substance Use Topics  . Smoking status: Former Smoker    Years: 15.00    Types: Cigarettes  . Smokeless tobacco: Never Used  . Alcohol use Yes     Comment: 1-2 glasses of wine a night   Objective:   BP 128/64 (BP Location: Right Arm, Patient Position: Sitting, Cuff Size: Normal)   Pulse (!) 58   Temp 98.4 F (36.9 C)   Resp 16   Ht 5\' 7"  (1.702 m)   Wt 174 lb (78.9 kg)   SpO2 99%   BMI 27.25 kg/m  Vitals:   09/29/16 0820  BP: 128/64  Pulse: (!) 58  Resp: 16  Temp: 98.4 F (36.9 C)  SpO2: 99%  Weight: 174 lb (78.9 kg)  Height: 5\' 7"  (1.702 m)     Physical Exam  Constitutional: He is oriented to person, place, and time. He appears well-developed and well-nourished.  HENT:  Head: Normocephalic and atraumatic.  Eyes: Conjunctivae are normal. No scleral icterus.   Neck: Neck supple. No thyromegaly present.  Cardiovascular: Normal rate, regular rhythm and normal heart sounds.   Pulmonary/Chest: Effort normal and breath sounds normal.  Abdominal: Soft.  Musculoskeletal: Normal range of motion. He exhibits edema.  Trace edema bilaterally.   Neurological: He is alert and oriented to person, place, and time.  Skin: Skin is warm and dry.  Psychiatric: He has a normal mood and affect. His behavior is normal. Judgment and thought content normal.        Assessment & Plan:  1. Borderline diabetes Stable. HgbA1c was 5.7 today. Will recheck again in 6 months.  - POCT glycosylated hemoglobin (Hb A1C)--5.7 today.  2. Benign essential HTN Stable. Continue current medications.  3.CAD All risk factors treated.      I have done the exam and reviewed the above chart and it is accurate to the best of my knowledge. Development worker, community has been used in this note in any air is in the dictation or transcription are unintentional. I have done the exam and reviewed the above chart and it is accurate to the best of my knowledge. Development worker, community has been used in this note in any air is in the dictation or transcription are unintentional.  Wilhemena Durie, MD  [place]

## 2016-10-01 DIAGNOSIS — M25661 Stiffness of right knee, not elsewhere classified: Secondary | ICD-10-CM | POA: Diagnosis not present

## 2016-10-01 DIAGNOSIS — M25561 Pain in right knee: Secondary | ICD-10-CM | POA: Diagnosis not present

## 2016-10-01 DIAGNOSIS — R609 Edema, unspecified: Secondary | ICD-10-CM | POA: Diagnosis not present

## 2016-10-17 DIAGNOSIS — M1711 Unilateral primary osteoarthritis, right knee: Secondary | ICD-10-CM | POA: Diagnosis not present

## 2016-10-24 DIAGNOSIS — M1711 Unilateral primary osteoarthritis, right knee: Secondary | ICD-10-CM | POA: Diagnosis not present

## 2016-10-31 DIAGNOSIS — M1711 Unilateral primary osteoarthritis, right knee: Secondary | ICD-10-CM | POA: Diagnosis not present

## 2016-11-06 ENCOUNTER — Other Ambulatory Visit (INDEPENDENT_AMBULATORY_CARE_PROVIDER_SITE_OTHER): Payer: Self-pay | Admitting: Vascular Surgery

## 2016-11-06 DIAGNOSIS — I779 Disorder of arteries and arterioles, unspecified: Secondary | ICD-10-CM

## 2016-11-06 DIAGNOSIS — I739 Peripheral vascular disease, unspecified: Principal | ICD-10-CM

## 2016-11-07 ENCOUNTER — Ambulatory Visit (INDEPENDENT_AMBULATORY_CARE_PROVIDER_SITE_OTHER): Payer: Medicare Other

## 2016-11-07 ENCOUNTER — Encounter (INDEPENDENT_AMBULATORY_CARE_PROVIDER_SITE_OTHER): Payer: Self-pay | Admitting: Vascular Surgery

## 2016-11-07 ENCOUNTER — Ambulatory Visit (INDEPENDENT_AMBULATORY_CARE_PROVIDER_SITE_OTHER): Payer: Medicare Other | Admitting: Vascular Surgery

## 2016-11-07 VITALS — BP 150/65 | HR 56 | Resp 16 | Wt 174.0 lb

## 2016-11-07 DIAGNOSIS — I6523 Occlusion and stenosis of bilateral carotid arteries: Secondary | ICD-10-CM | POA: Diagnosis not present

## 2016-11-07 DIAGNOSIS — I779 Disorder of arteries and arterioles, unspecified: Secondary | ICD-10-CM

## 2016-11-07 DIAGNOSIS — I6529 Occlusion and stenosis of unspecified carotid artery: Secondary | ICD-10-CM | POA: Insufficient documentation

## 2016-11-07 DIAGNOSIS — I10 Essential (primary) hypertension: Secondary | ICD-10-CM

## 2016-11-07 DIAGNOSIS — E785 Hyperlipidemia, unspecified: Secondary | ICD-10-CM

## 2016-11-07 DIAGNOSIS — I739 Peripheral vascular disease, unspecified: Principal | ICD-10-CM

## 2016-11-07 LAB — VAS US CAROTID
LCCADDIAS: 14 cm/s
LCCADSYS: 86 cm/s
LCCAPSYS: 72 cm/s
LEFT ECA DIAS: 0 cm/s
LEFT VERTEBRAL DIAS: 8 cm/s
LICADDIAS: -11 cm/s
LICADSYS: -44 cm/s
Left CCA prox dias: 10 cm/s
Left ICA prox dias: 29 cm/s
Left ICA prox sys: 209 cm/s
RCCADSYS: -148 cm/s
RIGHT CCA MID DIAS: 13 cm/s
RIGHT ECA DIAS: 0 cm/s
RIGHT VERTEBRAL DIAS: 3 cm/s
Right CCA prox dias: 16 cm/s
Right CCA prox sys: 115 cm/s

## 2016-11-07 NOTE — Progress Notes (Signed)
MRN : [id number]  Austin [name] is a 83 y.o. ([birth year]) male who presents with chief complaint of  Chief Complaint  Patient presents with  . Follow-up  .  History of Present Illness: Patient returns in follow-up of his carotid disease. He has had some orthopedic issues and has some arthritic pains but really otherwise is doing well. Denies any focal neurologic symptoms. Specifically, the patient denies amaurosis fugax, speech or swallowing difficulties, or arm or leg weakness or numbness. His carotid duplex today shows stable stenosis on the right in the 40-59% range with mild progression of his disease just into the 60-79% range on the left.  Current Outpatient Prescriptions  Medication Sig Dispense Refill  . amLODipine (NORVASC) 5 MG tablet Take 5 mg by mouth daily.    Marland Kitchen aspirin EC 325 MG tablet Take 1 tablet (325 mg total) by mouth daily. 30 tablet 0  . atorvastatin (LIPITOR) 20 MG tablet Take 20 mg by mouth every other day.     . Biotin 5000 MCG CAPS Take 5,000 mcg by mouth daily.     . Cholecalciferol (VITAMIN D3) 2000 UNITS capsule Take 2,000 Units by mouth 2 (two) times daily.     Marland Kitchen COENZYME Q-10 PO Take 200 mg by mouth daily.     . dorzolamide-timolol (COSOPT) 22.3-6.8 MG/ML ophthalmic solution Place 1 drop into both eyes 2 (two) times daily.     Marland Kitchen glucosamine-chondroitin 500-400 MG tablet Take 1 tablet by mouth 2 (two) times daily.    . isosorbide mononitrate (IMDUR) 60 MG 24 hr tablet Take 60 mg by mouth daily.     . naproxen sodium (ANAPROX) 220 MG tablet Take 220-440 mg by mouth daily as needed (pain).    . nitroGLYCERIN (NITROSTAT) 0.4 MG SL tablet Place 0.4 mg under the tongue every 5 (five) minutes as needed for chest pain.     Marland Kitchen OMEGA-3 FATTY ACIDS PO Take 1,200 mcg by mouth 2 (two) times daily.     Marland Kitchen omeprazole (PRILOSEC) 20 MG capsule Take 20 mg by mouth daily. Reported on 08/21/2015    . ondansetron (ZOFRAN) 4 MG tablet Take 1 tablet (4 mg total) by mouth every 8  (eight) hours as needed for nausea or vomiting. 30 tablet 0  . Polyethyl Glycol-Propyl Glycol (SYSTANE OP) Apply 1 drop to eye daily as needed (Dry eyes).    . tamsulosin (FLOMAX) 0.4 MG CAPS capsule TAKE 1 CAPSULE BY MOUTH EVERY DAY (Patient taking differently: in the evening) 30 capsule 12  . Travoprost, BAK Free, (TRAVATAN Z) 0.004 % SOLN ophthalmic solution Place 1 drop into both eyes at bedtime.     . valsartan-hydrochlorothiazide (DIOVAN HCT) 320-25 MG per tablet Take 1 tablet by mouth daily.     . vitamin B-12 (CYANOCOBALAMIN) 1000 MCG tablet Take 1,000 mcg by mouth daily.    Marland Kitchen HYDROcodone-acetaminophen (NORCO) 5-325 MG tablet Take 1 tablet by mouth every 4 (four) hours as needed for moderate pain. MAXIMUM TOTAL ACETAMINOPHEN DOSE IS 4000 MG PER DAY (Patient not taking: Reported on 11/07/2016) 40 tablet 0   No current facility-administered medications for this visit.     Past Medical History:  Diagnosis Date  . Allergy   . Arthritis   . Coronary artery disease   . GERD (gastroesophageal reflux disease)   . History of kidney stones   . HLD (hyperlipidemia)   . Hypertension   . Myocardial infarction ([place]) 2011  . Peripheral vascular disease ([place])   .  VHD (valvular heart disease)     Past Surgical History:  Procedure Laterality Date  . CARDIAC CATHETERIZATION  2007  . CARDIAC CATHETERIZATION N/A 04/17/2015   Procedure: Left Heart Cath and Coronary Angiography;  Surgeon: Corey Skains, MD;  Location: [facility];  Service: Cardiovascular;  Laterality: N/A;  . CARPAL TUNNEL RELEASE Bilateral    right 06/09/08 left 05/03/09  . COLONOSCOPY N/A 10/02/2014   Procedure: COLONOSCOPY;  Surgeon: Manya Silvas, MD;  Location: [facility];  Service: Endoscopy;  Laterality: N/A;  . CORONARY ARTERY BYPASS GRAFT  2007  . HEMORROIDECTOMY    . KNEE ARTHROSCOPY Right 08/05/2016   Procedure: ARTHROSCOPY KNEE;  Surgeon: Thornton Park, MD;  Location: [facility];  Service: Orthopedics;   Laterality: Right;  . KNEE ARTHROSCOPY WITH LATERAL RELEASE Right 08/05/2016   Procedure: KNEE ARTHROSCOPY WITH LATERAL RELEASE;  Surgeon: Thornton Park, MD;  Location: [facility];  Service: Orthopedics;  Laterality: Right;  . KNEE ARTHROSCOPY WITH MEDIAL MENISECTOMY Right 08/05/2016   Procedure: KNEE ARTHROSCOPY WITH MEDIAL MENISECTOMY;  Surgeon: Thornton Park, MD;  Location: [facility];  Service: Orthopedics;  Laterality: Right;  Partial  . LUMBAR EPIDURAL INJECTION  2011  . MOUTH SURGERY    . SHOULDER SURGERY Right   . SYNOVECTOMY Right 08/05/2016   Procedure: SYNOVECTOMY;  Surgeon: Thornton Park, MD;  Location: [facility];  Service: Orthopedics;  Laterality: Right;  arthroscopic extensive synovectomy  . TONSILLECTOMY      Social History Social History  Substance Use Topics  . Smoking status: Former Smoker    Years: 15.00    Types: Cigarettes  . Smokeless tobacco: Never Used  . Alcohol use Yes     Comment: 1-2 glasses of wine a night    Family History Family History  Problem Relation Age of Onset  . Heart disease Mother   . Heart disease Father   . Heart disease Brother   . Heart disease Brother   . Heart disease Brother   . Heart disease Brother     Allergies  Allergen Reactions  . Penicillins Itching, Swelling and Rash    Has patient had a PCN reaction causing immediate rash, facial/tongue/throat swelling, SOB or lightheadedness with hypotension: Yes Has patient had a PCN reaction causing severe rash involving mucus membranes or skin necrosis: No Has patient had a PCN reaction that required hospitalization No Has patient had a PCN reaction occurring within the last 10 years: No If all of the above answers are "NO", then may proceed with Cephalosporin use.      REVIEW OF SYSTEMS (Negative unless checked)  Constitutional: [] Weight loss  [] Fever  [] Chills Cardiac: [] Chest pain   [] Chest pressure   [] Palpitations   [] Shortness of breath when laying flat   [] Shortness of  breath at rest   [] Shortness of breath with exertion. Vascular:  [] Pain in legs with walking   [] Pain in legs at rest   [] Pain in legs when laying flat   [] Claudication   [] Pain in feet when walking  [] Pain in feet at rest  [] Pain in feet when laying flat   [] History of DVT   [] Phlebitis   [] Swelling in legs   [] Varicose veins   [] Non-healing ulcers Pulmonary:   [] Uses home oxygen   [] Productive cough   [] Hemoptysis   [] Wheeze  [] COPD   [] Asthma Neurologic:  [] Dizziness  [] Blackouts   [] Seizures   [] History of stroke   [] History of TIA  [] Aphasia   [] Temporary blindness   []   Dysphagia   [] Weakness or numbness in arms   [] Weakness or numbness in legs Musculoskeletal:  [x] Arthritis   [] Joint swelling   [] Joint pain   [] Low back pain Hematologic:  [] Easy bruising  [] Easy bleeding   [] Hypercoagulable state   [] Anemic  [] Hepatitis Gastrointestinal:  [] Blood in stool   [] Vomiting blood  [] Gastroesophageal reflux/heartburn   [] Difficulty swallowing. Genitourinary:  [] Chronic kidney disease   [] Difficult urination  [] Frequent urination  [] Burning with urination   [] Blood in urine Skin:  [] Rashes   [] Ulcers   [] Wounds Psychological:  [] History of anxiety   []  History of major depression.  Physical Examination  Vitals:   11/07/16 0904  BP: (!) 150/65  Pulse: (!) 56  Resp: 16  Weight: 78.9 kg (174 lb)   Body mass index is 27.25 kg/m. Gen:  WD/WN, NAD. Appears younger than stated age Head: [place]/AT, No temporalis wasting. Ear/Nose/Throat: Hearing grossly intact, nares w/o erythema or drainage, trachea midline Eyes: Conjunctiva clear. Sclera non-icteric Neck: Supple.  No JVD.  Pulmonary:  Good air movement, equal and clear to auscultation bilaterally.  Cardiac: RRR, normal S1, S2, no Murmurs, rubs or gallops. Vascular:  Vessel Right Left  Radial Palpable Palpable          Carotid Palpable, without bruit Palpable, with soft bruit                       Gastrointestinal: soft,  non-tender/non-distended.  Musculoskeletal: M/S 5/5 throughout.  No deformity or atrophy.  Neurologic: CN 2-12 intact. Sensation grossly intact in extremities.  Symmetrical.  Speech is fluent. Motor exam as listed above. Psychiatric: Judgment intact, Mood & affect appropriate for pt's clinical situation. Dermatologic: No rashes or ulcers noted.  No cellulitis or open wounds.      CBC Lab Results  Component Value Date   WBC 6.3 07/28/2016   HGB 13.6 07/28/2016   HCT 39.0 (L) 07/28/2016   MCV 91.3 07/28/2016   PLT 168 07/28/2016    BMET    Component Value Date/Time   NA 134 (L) 07/28/2016 1146   NA 139 02/27/2016 1031   NA 137 05/09/2013 0912   K 4.0 07/28/2016 1146   K 4.4 05/09/2013 0912   CL 101 07/28/2016 1146   CL 103 05/09/2013 0912   CO2 24 07/28/2016 1146   CO2 29 05/09/2013 0912   GLUCOSE 162 (H) 07/28/2016 1146   GLUCOSE 93 05/09/2013 0912   BUN 23 (H) 07/28/2016 1146   BUN 25 02/27/2016 1031   BUN 26 (H) 05/09/2013 0912   CREATININE 1.51 (H) 07/28/2016 1146   CREATININE 1.50 (H) 05/09/2013 0912   CALCIUM 8.9 07/28/2016 1146   CALCIUM 9.2 05/09/2013 0912   GFRNONAA 40 (L) 07/28/2016 1146   GFRNONAA 43 (L) 05/09/2013 0912   GFRAA 47 (L) 07/28/2016 1146   GFRAA 50 (L) 05/09/2013 0912   CrCl cannot be calculated (Patient's most recent lab result is older than the maximum 21 days allowed.).  COAG Lab Results  Component Value Date   INR 1.03 07/28/2016    Radiology No results found.    Assessment/Plan Benign essential HTN blood pressure control important in reducing the progression of atherosclerotic disease. On appropriate oral medications.   HLD (hyperlipidemia) lipid control important in reducing the progression of atherosclerotic disease. Continue statin therapy   Carotid stenosis His carotid duplex today shows stable stenosis on the right in the 40-59% range with mild progression of his disease just into the 60-79% range  on the left. At 37  and asymptomatic, medical management would be the only appropriate therapy at this point. Continue aspirin and statin agent. Recheck in 1 year.    Leotis Pain, MD  11/07/2016 10:09 AM    This note was created with Dragon medical transcription system.  Any errors from dictation are purely unintentional

## 2016-11-07 NOTE — Assessment & Plan Note (Signed)
lipid control important in reducing the progression of atherosclerotic disease. Continue statin therapy  

## 2016-11-07 NOTE — Assessment & Plan Note (Signed)
blood pressure control important in reducing the progression of atherosclerotic disease. On appropriate oral medications.  

## 2016-11-07 NOTE — Assessment & Plan Note (Signed)
His carotid duplex today shows stable stenosis on the right in the 40-59% range with mild progression of his disease just into the 60-79% range on the left. At 15 and asymptomatic, medical management would be the only appropriate therapy at this point. Continue aspirin and statin agent. Recheck in 1 year.

## 2016-11-10 ENCOUNTER — Ambulatory Visit
Admission: RE | Admit: 2016-11-10 | Discharge: 2016-11-10 | Disposition: A | Discharge status: Home / Self Care | Payer: Medicare Other | Source: Ambulatory Visit | Attending: Pain Medicine | Admitting: Pain Medicine

## 2016-11-10 ENCOUNTER — Ambulatory Visit: Payer: Medicare Other | Attending: Pain Medicine | Admitting: Pain Medicine

## 2016-11-10 ENCOUNTER — Encounter: Payer: Self-pay | Admitting: Pain Medicine

## 2016-11-10 VITALS — BP 130/63 | HR 66 | Temp 97.6°F | Resp 22 | Ht 67.0 in | Wt 170.0 lb

## 2016-11-10 DIAGNOSIS — M75120 Complete rotator cuff tear or rupture of unspecified shoulder, not specified as traumatic: Secondary | ICD-10-CM | POA: Insufficient documentation

## 2016-11-10 DIAGNOSIS — M25619 Stiffness of unspecified shoulder, not elsewhere classified: Secondary | ICD-10-CM | POA: Insufficient documentation

## 2016-11-10 DIAGNOSIS — M48062 Spinal stenosis, lumbar region with neurogenic claudication: Secondary | ICD-10-CM | POA: Diagnosis not present

## 2016-11-10 DIAGNOSIS — M9983 Other biomechanical lesions of lumbar region: Secondary | ICD-10-CM | POA: Insufficient documentation

## 2016-11-10 DIAGNOSIS — G8929 Other chronic pain: Secondary | ICD-10-CM | POA: Diagnosis not present

## 2016-11-10 DIAGNOSIS — Z9889 Other specified postprocedural states: Secondary | ICD-10-CM | POA: Diagnosis not present

## 2016-11-10 DIAGNOSIS — M545 Low back pain: Secondary | ICD-10-CM | POA: Insufficient documentation

## 2016-11-10 DIAGNOSIS — M4310 Spondylolisthesis, site unspecified: Secondary | ICD-10-CM | POA: Insufficient documentation

## 2016-11-10 DIAGNOSIS — M48061 Spinal stenosis, lumbar region without neurogenic claudication: Secondary | ICD-10-CM

## 2016-11-10 DIAGNOSIS — R29898 Other symptoms and signs involving the musculoskeletal system: Secondary | ICD-10-CM | POA: Insufficient documentation

## 2016-11-10 DIAGNOSIS — Z951 Presence of aortocoronary bypass graft: Secondary | ICD-10-CM | POA: Insufficient documentation

## 2016-11-10 DIAGNOSIS — M4316 Spondylolisthesis, lumbar region: Secondary | ICD-10-CM | POA: Diagnosis not present

## 2016-11-10 DIAGNOSIS — M1711 Unilateral primary osteoarthritis, right knee: Secondary | ICD-10-CM | POA: Insufficient documentation

## 2016-11-10 DIAGNOSIS — M6281 Muscle weakness (generalized): Secondary | ICD-10-CM | POA: Insufficient documentation

## 2016-11-10 DIAGNOSIS — Z880 Allergy status to penicillin: Secondary | ICD-10-CM | POA: Diagnosis not present

## 2016-11-10 MED ORDER — ROPIVACAINE HCL 2 MG/ML IJ SOLN
2.0000 mL | Freq: Once | INTRAMUSCULAR | Status: AC
  Administered 2016-11-10: 2 mL via EPIDURAL
  Filled 2016-11-10: qty 10

## 2016-11-10 MED ORDER — SODIUM CHLORIDE 0.9% FLUSH
2.0000 mL | Freq: Once | INTRAVENOUS | Status: AC
  Administered 2016-11-10: 2 mL

## 2016-11-10 MED ORDER — LIDOCAINE HCL (PF) 1 % IJ SOLN
10.0000 mL | Freq: Once | INTRAMUSCULAR | Status: AC
  Administered 2016-11-10: 5 mL
  Filled 2016-11-10: qty 10

## 2016-11-10 MED ORDER — TRIAMCINOLONE ACETONIDE 40 MG/ML IJ SUSP
40.0000 mg | Freq: Once | INTRAMUSCULAR | Status: AC
  Administered 2016-11-10: 40 mg
  Filled 2016-11-10: qty 1

## 2016-11-10 MED ORDER — IOPAMIDOL (ISOVUE-M 200) INJECTION 41%
10.0000 mL | Freq: Once | INTRAMUSCULAR | Status: AC
  Administered 2016-11-10: 10 mL via EPIDURAL
  Filled 2016-11-10: qty 10

## 2016-11-10 NOTE — Progress Notes (Signed)
Patient's Name: Austin [name]  MRN: [id number]  Referring Provider: Jerrol Banana.,*  DOB: 19-Mar-1929  PCP: Jerrol Banana., MD  DOS: 11/10/2016  Note by: Kathlen Brunswick. Dossie Arbour, MD  Service setting: Ambulatory outpatient  Location: [facility] (AMB) Pain Management Facility  Visit type: Procedure  Specialty: Interventional Pain Management  Patient type: Established   Primary Reason for Visit: Interventional Pain Management Treatment. CC: Back Pain (low)  Procedure:  Anesthesia, Analgesia, Anxiolysis:  Type: Therapeutic Inter-Laminar Epidural Steroid Injection Region: Lumbar Level: L3-4 Level. Laterality: Right-Sided Paramedial  Type: Local Anesthesia with Moderate (Conscious) Sedation Local Anesthetic: Lidocaine 1% Route: Intravenous (IV) IV Access: Secured Sedation: Meaningful verbal contact was maintained at all times during the procedure  Indication(s): Analgesia and Anxiety  Indications: 1. Spinal stenosis of lumbar region with neurogenic claudication   2. Chronic low back pain (Location of Primary Source of Pain) (Bilateral) (R>L)   3. Grade 1 Anterolisthesis of L3 over L4   4. Lumbar foraminal stenosis (Right L2-3; Bilateral L3-4, L4-5)   5. Osteoarthritis of knee (Right)    Pain Score: Pre-procedure: 6 /10 Post-procedure: 0-No pain/10  Pre-op Assessment:  Previous date of service: 04/14/16 Service provided: Evaluation Austin [name] is a 83 y.o. (year old), male patient, seen today for interventional treatment. He  has a past surgical history that includes Shoulder surgery (Right); Coronary artery bypass graft (2007); Colonoscopy (N/A, 10/02/2014); Tonsillectomy; Hemorroidectomy; Carpal tunnel release (Bilateral); Lumbar epidural injection (2011); Mouth surgery; Cardiac catheterization (2007); Cardiac catheterization (N/A, 04/17/2015); Knee arthroscopy (Right, 08/05/2016); Knee arthroscopy with medial menisectomy (Right, 08/05/2016); Synovectomy (Right, 08/05/2016); and Knee  arthroscopy with lateral release (Right, 08/05/2016). His primarily concern today is the Back Pain (low)  Initial Vital Signs: Blood pressure (!) 133/54, pulse 66, temperature 97.6 F (36.4 C), resp. rate 18, height 5\' 7"  (1.702 m), weight 170 lb (77.1 kg), SpO2 99 %. BMI: 26.63 kg/m  Risk Assessment: Allergies: Reviewed. He is allergic to penicillins.  Allergy Precautions: None required Coagulopathies: Reviewed. None identified.  Blood-thinner therapy: None at this time Active Infection(s): Reviewed. None identified. Austin [name] is afebrile  Site Confirmation: Austin [name] was asked to confirm the procedure and laterality before marking the site Procedure checklist: Completed Consent: Before the procedure and under the influence of no sedative(s), amnesic(s), or anxiolytics, the patient was informed of the treatment options, risks and possible complications. To fulfill our ethical and legal obligations, as recommended by the American Medical Association's Code of Ethics, I have informed the patient of my clinical impression; the nature and purpose of the treatment or procedure; the risks, benefits, and possible complications of the intervention; the alternatives, including doing nothing; the risk(s) and benefit(s) of the alternative treatment(s) or procedure(s); and the risk(s) and benefit(s) of doing nothing. The patient was provided information about the general risks and possible complications associated with the procedure. These may include, but are not limited to: failure to achieve desired goals, infection, bleeding, organ or nerve damage, allergic reactions, paralysis, and death. In addition, the patient was informed of those risks and complications associated to Spine-related procedures, such as failure to decrease pain; infection (i.e.: Meningitis, epidural or intraspinal abscess); bleeding (i.e.: epidural hematoma, subarachnoid hemorrhage, or any other type of intraspinal or peri-dural  bleeding); organ or nerve damage (i.e.: Any type of peripheral nerve, nerve root, or spinal cord injury) with subsequent damage to sensory, motor, and/or autonomic systems, resulting in permanent pain, numbness, and/or weakness of one or several areas of the body; allergic  reactions; (i.e.: anaphylactic reaction); and/or death. Furthermore, the patient was informed of those risks and complications associated with the medications. These include, but are not limited to: allergic reactions (i.e.: anaphylactic or anaphylactoid reaction(s)); adrenal axis suppression; blood sugar elevation that in diabetics may result in ketoacidosis or comma; water retention that in patients with history of congestive heart failure may result in shortness of breath, pulmonary edema, and decompensation with resultant heart failure; weight gain; swelling or edema; medication-induced neural toxicity; particulate matter embolism and blood vessel occlusion with resultant organ, and/or nervous system infarction; and/or aseptic necrosis of one or more joints. Finally, the patient was informed that Medicine is not an exact science; therefore, there is also the possibility of unforeseen or unpredictable risks and/or possible complications that may result in a catastrophic outcome. The patient indicated having understood very clearly. We have given the patient no guarantees and we have made no promises. Enough time was given to the patient to ask questions, all of which were answered to the patient's satisfaction. Austin [name] has indicated that he wanted to continue with the procedure. Attestation: I, the ordering provider, attest that I have discussed with the patient the benefits, risks, side-effects, alternatives, likelihood of achieving goals, and potential problems during recovery for the procedure that I have provided informed consent. Date: 11/10/2016; Time: 9:31 AM  Pre-Procedure Preparation:  Monitoring: As per clinic protocol.  Respiration, ETCO2, SpO2, BP, heart rate and rhythm monitor placed and checked for adequate function Safety Precautions: Patient was assessed for positional comfort and pressure points before starting the procedure. Time-out: I initiated and conducted the "Time-out" before starting the procedure, as per protocol. The patient was asked to participate by confirming the accuracy of the "Time Out" information. Verification of the correct person, site, and procedure were performed and confirmed by me, the nursing staff, and the patient. "Time-out" conducted as per Joint Commission's Universal Protocol (UP.01.01.01). "Time-out" Date & Time: 11/10/2016; 1010 hrs.  Description of Procedure Process:   Position: Prone with head of the table was raised to facilitate breathing. Target Area: The interlaminar space, initially targeting the lower laminar border of the superior vertebral body. Approach: Paramedial approach. Area Prepped: Entire Posterior Lumbar Region Prepping solution: ChloraPrep (2% chlorhexidine gluconate and 70% isopropyl alcohol) Safety Precautions: Aspiration looking for blood return was conducted prior to all injections. At no point did we inject any substances, as a needle was being advanced. No attempts were made at seeking any paresthesias. Safe injection practices and needle disposal techniques used. Medications properly checked for expiration dates. SDV (single dose vial) medications used. Description of the Procedure: Protocol guidelines were followed. The procedure needle was introduced through the skin, ipsilateral to the reported pain, and advanced to the target area. Bone was contacted and the needle walked caudad, until the lamina was cleared. The epidural space was identified using "loss-of-resistance technique" with 2-3 ml of PF-NaCl (0.9% NSS), in a 5cc LOR glass syringe. Vitals:   11/10/16 1010 11/10/16 1015 11/10/16 1020 11/10/16 1023  BP: (!) 123/59 124/60 (!) 120/59 130/63   Pulse:      Resp: 17 19 17  (!) 22  Temp:      SpO2: 95% 96% 98% 95%  Weight:      Height:        Start Time: 1016 hrs. End Time: 1022 hrs. Materials:  Needle(s) Type: Epidural needle Gauge: 17G Length: 3.5-in Medication(s): We administered iopamidol, triamcinolone acetonide, lidocaine (PF), sodium chloride flush, and ropivacaine (PF) 2 mg/mL (0.2%). Please  see chart orders for dosing details.  Imaging Guidance (Spinal):  Type of Imaging Technique: Fluoroscopy Guidance (Spinal) Indication(s): Assistance in needle guidance and placement for procedures requiring needle placement in or near specific anatomical locations not easily accessible without such assistance. Exposure Time: Please see nurses notes. Contrast: Before injecting any contrast, we confirmed that the patient did not have an allergy to iodine, shellfish, or radiological contrast. Once satisfactory needle placement was completed at the desired level, radiological contrast was injected. Contrast injected under live fluoroscopy. No contrast complications. See chart for type and volume of contrast used. Fluoroscopic Guidance: I was personally present during the use of fluoroscopy. "Tunnel Vision Technique" used to obtain the best possible view of the target area. Parallax error corrected before commencing the procedure. "Direction-depth-direction" technique used to introduce the needle under continuous pulsed fluoroscopy. Once target was reached, antero-posterior, oblique, and lateral fluoroscopic projection used confirm needle placement in all planes. Images permanently stored in EMR. Interpretation: I personally interpreted the imaging intraoperatively. Adequate needle placement confirmed in multiple planes. Appropriate spread of contrast into desired area was observed. No evidence of afferent or efferent intravascular uptake. No intrathecal or subarachnoid spread observed. Permanent images saved into the patient's  record.  Antibiotic Prophylaxis:  Indication(s): None identified Antibiotic given: None  Post-operative Assessment:  EBL: None Complications: No immediate post-treatment complications observed by team, or reported by patient. Note: The patient tolerated the entire procedure well. A repeat set of vitals were taken after the procedure and the patient was kept under observation following institutional policy, for this type of procedure. Post-procedural neurological assessment was performed, showing return to baseline, prior to discharge. The patient was provided with post-procedure discharge instructions, including a section on how to identify potential problems. Should any problems arise concerning this procedure, the patient was given instructions to immediately contact us, at any time, without hesitation. In any case, we plan to contact the patient by telephone for a follow-up status report regarding this interventional procedure. Comments:  No additional relevant information.  Plan of Care  Disposition: Discharge home  Discharge Date & Time: 11/10/2016; 1030 hrs.  Physician-requested Follow-up:  Return in about 2 weeks (around 11/24/2016) for post-procedure eval (in 2 wks), by MD.  Future Appointments Date Time Provider [place]  12/02/2016 8:00 AM Milinda Pointer, MD [facility] None  03/05/2017 9:00 AM [facility]-NURSE HEALTH ADVISOR [facility] None  03/05/2017 9:30 AM Jerrol Banana., MD [facility] None  11/06/2017 8:30 AM [facility] VASC 2 [facility] None  11/06/2017 9:30 AM Dew, Erskine Squibb, MD [facility] None   Medications ordered for procedure: Meds ordered this encounter  Medications  . iopamidol (ISOVUE-M) 41 % intrathecal injection 10 mL  . triamcinolone acetonide (KENALOG-40) injection 40 mg  . lidocaine (PF) (XYLOCAINE) 1 % injection 10 mL  . sodium chloride flush (NS) 0.9 % injection 2 mL  . ropivacaine (PF) 2 mg/mL (0.2%) (NAROPIN) injection 2 mL   Medications administered: We  administered iopamidol, triamcinolone acetonide, lidocaine (PF), sodium chloride flush, and ropivacaine (PF) 2 mg/mL (0.2%).  See the medical record for exact dosing, route, and time of administration.  Lab-work, Procedure(s), & Referral(s) Ordered: Orders Placed This Encounter  Procedures  . Lumbar Epidural Injection  . DG C-Arm 1-60 Min-No Report  . Informed Consent Details: Transcribe to consent form and obtain patient signature  . Provider attestation of informed consent for procedure/surgical case  . Verify informed consent  . Discharge instructions  . Follow-up   Imaging Ordered: Results for orders  placed in visit on 03/18/16  DG C-Arm 1-60 Min-No Report   Narrative CLINICAL DATA: Assistance in needle guidance and placement for procedures  requiring needle placement in or near specific anatomical locations not  easily accessible without such assistance.   C-ARM 1-60 MINUTES  Fluoroscopy was utilized by the requesting physician.  No radiographic  interpretation.     New Prescriptions   No medications on file   Primary Care Physician: Jerrol Banana., MD Location: [facility] Note by: Kathlen Brunswick. Dossie Arbour, M.D, DABA, DABAPM, DABPM, DABIPP, FIPP Date: 11/10/2016; Time: 1:41 PM  Disclaimer:  Medicine is not an Chief Strategy Officer. The only guarantee in medicine is that nothing is guaranteed. It is important to note that the decision to proceed with this intervention was based on the information collected from the patient. The Data and conclusions were drawn from the patient's questionnaire, the interview, and the physical examination. Because the information was provided in large part by the patient, it cannot be guaranteed that it has not been purposely or unconsciously manipulated. Every effort has been made to obtain as much relevant data as possible for this evaluation. It is important to note that the conclusions that lead to this procedure are  derived in large part from the available data. Always take into account that the treatment will also be dependent on availability of resources and existing treatment guidelines, considered by other Pain Management Practitioners as being common knowledge and practice, at the time of the intervention. For Medico-Legal purposes, it is also important to point out that variation in procedural techniques and pharmacological choices are the acceptable norm. The indications, contraindications, technique, and results of the above procedure should only be interpreted and judged by a Board-Certified Interventional Pain Specialist with extensive familiarity and expertise in the same exact procedure and technique.  Instructions provided at this appointment: Patient Instructions  ____________________________________________________________________________________________  Post-Procedure instructions Instructions:  Apply ice: Fill a plastic sandwich bag with crushed ice. Cover it with a small towel and apply to injection site. Apply for 15 minutes then remove x 15 minutes. Repeat sequence on day of procedure, until you go to bed. The purpose is to minimize swelling and discomfort after procedure.  Apply heat: Apply heat to procedure site starting the day following the procedure. The purpose is to treat any soreness and discomfort from the procedure.  Food intake: Start with clear liquids (like water) and advance to regular food, as tolerated.   Physical activities: Keep activities to a minimum for the first 8 hours after the procedure.   Driving: If you have received any sedation, you are not allowed to drive for 24 hours after your procedure.  Blood thinner: Restart your blood thinner 6 hours after your procedure. (Only for those taking blood thinners)  Insulin: As soon as you can eat, you may resume your normal dosing schedule. (Only for those taking insulin)  Infection prevention: Keep procedure site  clean and dry.  Post-procedure Pain Diary: Extremely important that this be done correctly and accurately. Recorded information will be used to determine the next step in treatment.  Pain evaluated is that of treated area only. Do not include pain from an untreated area.  Complete every hour, on the hour, for the initial 8 hours. Set an alarm to help you do this part accurately.  Do not go to sleep and have it completed later. It will not be accurate.  Follow-up appointment: Keep your follow-up appointment after the procedure.  Usually 2 weeks for most procedures. (6 weeks in the case of radiofrequency.) Bring you pain diary.  Expect:  From numbing medicine (AKA: Local Anesthetics): Numbness or decrease in pain.  Onset: Full effect within 15 minutes of injected.  Duration: It will depend on the type of local anesthetic used. On the average, 1 to 8 hours.   From steroids: Decrease in swelling or inflammation. Once inflammation is improved, relief of the pain will follow.  Onset of benefits: Depends on the amount of swelling present. The more swelling, the longer it will take for the benefits to be seen.   Duration: Steroids will stay in the system x 2 weeks. Duration of benefits will depend on multiple posibilities including persistent irritating factors.  From procedure: Some discomfort is to be expected once the numbing medicine wears off. This should be minimal if ice and heat are applied as instructed. Call if:  You experience numbness and weakness that gets worse with time, as opposed to wearing off.  New onset bowel or bladder incontinence. (Spinal procedures only)  Emergency Numbers:  [place] hours (Monday - Thursday, 8:00 AM - 4:00 PM) (Friday, 9:00 AM - 12:00 Noon): [phone]  After hours: [phone] ____________________________________________________________________________________________  Epidural Steroid Injection Patient  Information  Description: The epidural space surrounds the nerves as they exit the spinal cord.  In some patients, the nerves can be compressed and inflamed by a bulging disc or a tight spinal canal (spinal stenosis).  By injecting steroids into the epidural space, we can bring irritated nerves into direct contact with a potentially helpful medication.  These steroids act directly on the irritated nerves and can reduce swelling and inflammation which often leads to decreased pain.  Epidural steroids may be injected anywhere along the spine and from the neck to the low back depending upon the location of your pain.   After numbing the skin with local anesthetic (like Novocaine), a small needle is passed into the epidural space slowly.  You may experience a sensation of pressure while this is being done.  The entire block usually last less than 10 minutes.  Conditions which may be treated by epidural steroids:   Low back and leg pain  Neck and arm pain  Spinal stenosis  Post-laminectomy syndrome  Herpes zoster (shingles) pain  Pain from compression fractures  Preparation for the injection:  1. Do not eat any solid food or dairy products within 8 hours of your appointment.  2. You may drink clear liquids up to 3 hours before appointment.  Clear liquids include water, black coffee, juice or soda.  No milk or cream please. 3. You may take your regular medication, including pain medications, with a sip of water before your appointment  Diabetics should hold regular insulin (if taken separately) and take 1/2 normal NPH dos the morning of the procedure.  Carry some sugar containing items with you to your appointment. 4. A driver must accompany you and be prepared to drive you home after your procedure.  5. Bring all your current medications with your. 6. An IV may be inserted and sedation may be given at the discretion of the physician.   7. A blood pressure cuff, EKG and other monitors will  often be applied during the procedure.  Some patients may need to have extra oxygen administered for a short period. 8. You will be asked to provide medical information, including your allergies, prior to the procedure.  We must know immediately if you  are taking blood thinners (like Coumadin/Warfarin)  Or if you are allergic to IV iodine contrast (dye). We must know if you could possible be pregnant.  Possible side-effects:  Bleeding from needle site  Infection (rare, may require surgery)  Nerve injury (rare)  Numbness & tingling (temporary)  Difficulty urinating (rare, temporary)  Spinal headache ( a headache worse with upright posture)  Light -headedness (temporary)  Pain at injection site (several days)  Decreased blood pressure (temporary)  Weakness in arm/leg (temporary)  Pressure sensation in back/neck (temporary)  Call if you experience:  Fever/chills associated with headache or increased back/neck pain.  Headache worsened by an upright position.  New onset weakness or numbness of an extremity below the injection site  Hives or difficulty breathing (go to the emergency room)  Inflammation or drainage at the infection site  Severe back/neck pain  Any new symptoms which are concerning to you  Please note:  Although the local anesthetic injected can often make your back or neck feel good for several hours after the injection, the pain will likely return.  It takes 3-7 days for steroids to work in the epidural space.  You may not notice any pain relief for at least that one week.  If effective, we will often do a series of three injections spaced 3-6 weeks apart to maximally decrease your pain.  After the initial series, we generally will wait several months before considering a repeat injection of the same type.  If you have any questions, please call [phone] [facility]  General  Discharge Instructions :  If you need to reach your doctor call: Monday-Friday 8:00 am - 4:00 pm at [phone] or toll free [phone].  After clinic hours [phone] to have operator reach doctor.  Bring all of your medication bottles to all your appointments in the pain clinic.  To cancel or reschedule your appointment with Pain Management please remember to call 24 hours in advance to avoid a fee.  Refer to the educational materials which you have been given on: General Risks, I had my Procedure. Discharge Instructions, Post Sedation.  Post Procedure Instructions:  The drugs you were given will stay in your system until tomorrow, so for the next 24 hours you should not drive, make any legal decisions or drink any alcoholic beverages.  You may eat anything you prefer, but it is better to start with liquids then soups and crackers, and gradually work up to solid foods.  Please notify your doctor immediately if you have any unusual bleeding, trouble breathing or pain that is not related to your normal pain.  Depending on the type of procedure that was done, some parts of your body may feel week and/or numb.  This usually clears up by tonight or the next day.  Walk with the use of an assistive device or accompanied by an adult for the 24 hours.  You may use ice on the affected area for the first 24 hours.  Put ice in a Ziploc bag and cover with a towel and place against area 15 minutes on 15 minutes off.  You may switch to heat after 24 hours.

## 2016-11-10 NOTE — Patient Instructions (Addendum)
____________________________________________________________________________________________  Post-Procedure instructions Instructions:  Apply ice: Fill a plastic sandwich bag with crushed ice. Cover it with a small towel and apply to injection site. Apply for 15 minutes then remove x 15 minutes. Repeat sequence on day of procedure, until you go to bed. The purpose is to minimize swelling and discomfort after procedure.  Apply heat: Apply heat to procedure site starting the day following the procedure. The purpose is to treat any soreness and discomfort from the procedure.  Food intake: Start with clear liquids (like water) and advance to regular food, as tolerated.   Physical activities: Keep activities to a minimum for the first 8 hours after the procedure.   Driving: If you have received any sedation, you are not allowed to drive for 24 hours after your procedure.  Blood thinner: Restart your blood thinner 6 hours after your procedure. (Only for those taking blood thinners)  Insulin: As soon as you can eat, you may resume your normal dosing schedule. (Only for those taking insulin)  Infection prevention: Keep procedure site clean and dry.  Post-procedure Pain Diary: Extremely important that this be done correctly and accurately. Recorded information will be used to determine the next step in treatment.  Pain evaluated is that of treated area only. Do not include pain from an untreated area.  Complete every hour, on the hour, for the initial 8 hours. Set an alarm to help you do this part accurately.  Do not go to sleep and have it completed later. It will not be accurate.  Follow-up appointment: Keep your follow-up appointment after the procedure. Usually 2 weeks for most procedures. (6 weeks in the case of radiofrequency.) Bring you pain diary.  Expect:  From numbing medicine (AKA: Local Anesthetics): Numbness or decrease in pain.  Onset: Full effect within 15 minutes of  injected.  Duration: It will depend on the type of local anesthetic used. On the average, 1 to 8 hours.   From steroids: Decrease in swelling or inflammation. Once inflammation is improved, relief of the pain will follow.  Onset of benefits: Depends on the amount of swelling present. The more swelling, the longer it will take for the benefits to be seen.   Duration: Steroids will stay in the system x 2 weeks. Duration of benefits will depend on multiple posibilities including persistent irritating factors.  From procedure: Some discomfort is to be expected once the numbing medicine wears off. This should be minimal if ice and heat are applied as instructed. Call if:  You experience numbness and weakness that gets worse with time, as opposed to wearing off.  New onset bowel or bladder incontinence. (Spinal procedures only)  Emergency Numbers:  [place] hours (Monday - Thursday, 8:00 AM - 4:00 PM) (Friday, 9:00 AM - 12:00 Noon): [phone]  After hours: [phone] ____________________________________________________________________________________________  Epidural Steroid Injection Patient Information  Description: The epidural space surrounds the nerves as they exit the spinal cord.  In some patients, the nerves can be compressed and inflamed by a bulging disc or a tight spinal canal (spinal stenosis).  By injecting steroids into the epidural space, we can bring irritated nerves into direct contact with a potentially helpful medication.  These steroids act directly on the irritated nerves and can reduce swelling and inflammation which often leads to decreased pain.  Epidural steroids may be injected anywhere along the spine and from the neck to the low back depending upon the location of your pain.   After numbing the  skin with local anesthetic (like Novocaine), a small needle is passed into the epidural space slowly.  You may experience a sensation of pressure while this  is being done.  The entire block usually last less than 10 minutes.  Conditions which may be treated by epidural steroids:   Low back and leg pain  Neck and arm pain  Spinal stenosis  Post-laminectomy syndrome  Herpes zoster (shingles) pain  Pain from compression fractures  Preparation for the injection:  1. Do not eat any solid food or dairy products within 8 hours of your appointment.  2. You may drink clear liquids up to 3 hours before appointment.  Clear liquids include water, black coffee, juice or soda.  No milk or cream please. 3. You may take your regular medication, including pain medications, with a sip of water before your appointment  Diabetics should hold regular insulin (if taken separately) and take 1/2 normal NPH dos the morning of the procedure.  Carry some sugar containing items with you to your appointment. 4. A driver must accompany you and be prepared to drive you home after your procedure.  5. Bring all your current medications with your. 6. An IV may be inserted and sedation may be given at the discretion of the physician.   7. A blood pressure cuff, EKG and other monitors will often be applied during the procedure.  Some patients may need to have extra oxygen administered for a short period. 8. You will be asked to provide medical information, including your allergies, prior to the procedure.  We must know immediately if you are taking blood thinners (like Coumadin/Warfarin)  Or if you are allergic to IV iodine contrast (dye). We must know if you could possible be pregnant.  Possible side-effects:  Bleeding from needle site  Infection (rare, may require surgery)  Nerve injury (rare)  Numbness & tingling (temporary)  Difficulty urinating (rare, temporary)  Spinal headache ( a headache worse with upright posture)  Light -headedness (temporary)  Pain at injection site (several days)  Decreased blood pressure (temporary)  Weakness in arm/leg  (temporary)  Pressure sensation in back/neck (temporary)  Call if you experience:  Fever/chills associated with headache or increased back/neck pain.  Headache worsened by an upright position.  New onset weakness or numbness of an extremity below the injection site  Hives or difficulty breathing (go to the emergency room)  Inflammation or drainage at the infection site  Severe back/neck pain  Any new symptoms which are concerning to you  Please note:  Although the local anesthetic injected can often make your back or neck feel good for several hours after the injection, the pain will likely return.  It takes 3-7 days for steroids to work in the epidural space.  You may not notice any pain relief for at least that one week.  If effective, we will often do a series of three injections spaced 3-6 weeks apart to maximally decrease your pain.  After the initial series, we generally will wait several months before considering a repeat injection of the same type.  If you have any questions, please call [phone] [facility]  General Discharge Instructions :  If you need to reach your doctor call: Monday-Friday 8:00 am - 4:00 pm at [phone] or toll free [phone].  After clinic hours [phone] to have operator reach doctor.  Bring all of your medication bottles to all your appointments in the pain clinic.  To  cancel or reschedule your appointment with Pain Management please remember to call 24 hours in advance to avoid a fee.  Refer to the educational materials which you have been given on: General Risks, I had my Procedure. Discharge Instructions, Post Sedation.  Post Procedure Instructions:  The drugs you were given will stay in your system until tomorrow, so for the next 24 hours you should not drive, make any legal decisions or drink any alcoholic beverages.  You may eat anything you prefer,  but it is better to start with liquids then soups and crackers, and gradually work up to solid foods.  Please notify your doctor immediately if you have any unusual bleeding, trouble breathing or pain that is not related to your normal pain.  Depending on the type of procedure that was done, some parts of your body may feel week and/or numb.  This usually clears up by tonight or the next day.  Walk with the use of an assistive device or accompanied by an adult for the 24 hours.  You may use ice on the affected area for the first 24 hours.  Put ice in a Ziploc bag and cover with a towel and place against area 15 minutes on 15 minutes off.  You may switch to heat after 24 hours.

## 2016-11-11 ENCOUNTER — Telehealth: Payer: Self-pay | Admitting: *Deleted

## 2016-11-11 NOTE — Telephone Encounter (Signed)
Spoke with patient denies any questions or concerns re; procedure on yesterday.  

## 2016-11-29 ENCOUNTER — Other Ambulatory Visit: Payer: Self-pay | Admitting: Family Medicine

## 2016-12-01 NOTE — Progress Notes (Signed)
Patient's Name: Austin [name]  MRN: [id number]  Referring Provider: Jerrol [name].,*  DOB: 10-Mar-1929  PCP: Austin [name]., MD  DOS: 12/02/2016  Note by: Kathlen Brunswick. Dossie Arbour, MD  Service setting: Ambulatory outpatient  Specialty: Interventional Pain Management  Location: [facility] (AMB) Pain Management Facility    Patient type: Established   Primary Reason(s) for Visit: Encounter for post-procedure evaluation of chronic illness with mild to moderate exacerbation CC: Back Pain (lower)  HPI  Austin [name] is a 83 y.o. year old, male patient, who comes today for a post-procedure evaluation. He has History of colonic polyps; Absolute anemia; Benign essential HTN; Benign fibroma of prostate; Atherosclerosis of coronary artery; Cataract; Esophageal stenosis; Acid reflux; Glaucoma; Hemorrhoid; HLD (hyperlipidemia); Calculus of kidney; Obstructive apnea; Borderline diabetes; Peripheral blood vessel disorder ([name]); Esophagogastric ring; Heart valve disease; MI (mitral incompetence); Cardiomyopathy, ischemic; Coronary artery disease involving native coronary artery of native heart with unstable angina pectoris ([place]); Bradycardia; Chronic low back pain (Location of Primary Source of Pain) (Bilateral) (R>L); Lumbar spondylosis; Lumbar facet syndrome (Bilateral) (R>L); Peripheral vascular disease ([place]); Lumbar central spinal stenosis (severe at L4-5); Lumbar facet hypertrophy (multilevel); Grade 1 Anterolisthesis of L3 over L4; Lumbar foraminal stenosis (Right L2-3; Bilateral L3-4, L4-5); Hyperglycemia; Carotid stenosis; Chronic knee pain (Right); Full thickness rotator cuff tear; Stiffness of shoulder joint; Tear of medial meniscus of knee; Weakness of limb; Osteoarthritis of knee (Right); and Chronic pain syndrome on his problem list. His primarily concern today is the Back Pain (lower)  Pain Assessment: Location: Lower Back Radiating: radiates to both hips/buttocks bilaterally and down back of both upper  legs - right worse Onset: More than a month ago Duration: Chronic pain Quality: Aching Severity: 0-No pain/10 (self-reported pain score)  Note: Reported level is compatible with observation.                   Effect on ADL: Pace self but "I can do whatever I want too" Just Slower" Timing: Constant Modifying factors: Aleve- streching lying on floor  Austin [name] comes in today for post-procedure evaluation after the treatment done on 11/10/2016. The patient is doing much better after they have lumbar epidural steroid injection but he indicates that it seems not to have worked as well as the prior ones. The only difference between the one that we just completed and the prior ones is the level. This one was done at the L3-4 level and before that they were done at the L4-5 level. His spinal stenosis is in fact at the L4-5 level. He also indicates that he recently had some injections done on the right knee where they treated him with Uflexa 3. This was done by the orthopedic surgeons. Today I went over his alternatives for the treatment of his right knee pain. In his case in particular, he has an MRI that indicates that he does have some pathology in the right knee. However, he also has that L4-5 stenosis that may be responsible for some of the pain in that area.  Further details on both, my assessment(s), as well as the proposed treatment plan, please see below.  Post-Procedure Assessment  11/10/2016 Procedure: Diagnostic right-sided L3-4 lumbar epidural steroid injection under fluoroscopic guidance and IV sedation Pre-procedure pain score:  6/10 Post-procedure pain score: 0/10 (100% relief) Influential Factors: BMI: 26.63 kg/m Intra-procedural challenges: None observed Assessment challenges: None detected         Post-procedural adverse reactions or complications: None reported Reported side-effects: None  Sedation: Sedation provided. When no sedatives are used, the analgesic levels obtained are  directly associated to the effectiveness of the local anesthetics. However, when sedation is provided, the level of analgesia obtained during the initial 1 hour following the intervention, is believed to be the result of a combination of factors. These factors may include, but are not limited to: 1. The effectiveness of the local anesthetics used. 2. The effects of the analgesic(s) and/or anxiolytic(s) used. 3. The degree of discomfort experienced by the patient at the time of the procedure. 4. The patients ability and reliability in recalling and recording the events. 5. The presence and influence of possible secondary gains and/or psychosocial factors. Reported result: Relief experienced during the 1st hour after the procedure: 100 % (Ultra-Short Term Relief) Interpretative annotation: Analgesia during this period is likely to be Local Anesthetic and/or IV Sedative (Analgesic/Anxiolitic) related.          Effects of local anesthetic: The analgesic effects attained during this period are directly associated to the localized infiltration of local anesthetics and therefore cary significant diagnostic value as to the etiological location, or anatomical origin, of the pain. Expected duration of relief is directly dependent on the pharmacodynamics of the local anesthetic used. Long-acting (4-6 hours) anesthetics used.  Reported result: Relief during the next 4 to 6 hour after the procedure: 95 % (Short-Term Relief) Interpretative annotation: Complete relief would suggest area to be the source of the pain.          Long-term benefit: Defined as the period of time past the expected duration of local anesthetics. With the possible exception of prolonged sympathetic blockade from the local anesthetics, benefits during this period are typically attributed to, or associated with, other factors such as analgesic sensory neuropraxia, antiinflammatory effects, or beneficial biochemical changes provided by agents  other than the local anesthetics Reported result: Extended relief following procedure: 85 % (able to stand up straight in the mornings) (Long-Term Relief) Interpretative annotation: Good relief. This could suggest inflammation to be a significant component in the etiology to the pain.          Current benefits: Defined as persistent relief that continues at this point in time.   Reported results: Treated area: 85 %       Interpretative annotation: Ongoing benefits would suggest effective therapeutic approach  Interpretation: Results would suggest a successful diagnostic intervention.          Laboratory Chemistry  Inflammation Markers (CRP: Acute Phase) (ESR: Chronic Phase) No results found for: CRP, ESRSEDRATE               Renal Function Markers Lab Results  Component Value Date   BUN 23 (H) 07/28/2016   CREATININE 1.51 (H) 07/28/2016   GFRAA 47 (L) 07/28/2016   GFRNONAA 40 (L) 07/28/2016                 Hepatic Function Markers Lab Results  Component Value Date   AST 21 02/27/2016   ALT 29 02/27/2016   ALBUMIN 4.4 02/27/2016   ALKPHOS 81 02/27/2016                 Electrolytes Lab Results  Component Value Date   NA 134 (L) 07/28/2016   K 4.0 07/28/2016   CL 101 07/28/2016   CALCIUM 8.9 07/28/2016                 Neuropathy Markers No results found for: [id number]  Bone Pathology Markers Lab Results  Component Value Date   ALKPHOS 81 02/27/2016   CALCIUM 8.9 07/28/2016                 Coagulation Parameters Lab Results  Component Value Date   INR 1.03 07/28/2016   LABPROT 13.5 07/28/2016   APTT 41 (H) 07/28/2016   PLT 168 07/28/2016                 Cardiovascular Markers Lab Results  Component Value Date   HGB 13.6 07/28/2016   HCT 39.0 (L) 07/28/2016                 Note: Lab results reviewed.  Recent Diagnostic Imaging Review  Dg C-arm 1-60 Min-no Report  Result Date: 11/10/2016 Fluoroscopy was utilized by the requesting  physician.  No radiographic interpretation.   Note: Imaging results reviewed.          Meds   Current Meds  Medication Sig  . amLODipine (NORVASC) 5 MG tablet Take 5 mg by mouth daily.  Marland Kitchen aspirin EC 325 MG tablet Take 1 tablet (325 mg total) by mouth daily.  Marland Kitchen atorvastatin (LIPITOR) 20 MG tablet Take 20 mg by mouth every other day.   . Biotin 5000 MCG CAPS Take 5,000 mcg by mouth daily.   . Cholecalciferol (VITAMIN D3) 2000 UNITS capsule Take 2,000 Units by mouth 2 (two) times daily.   Marland Kitchen COENZYME Q-10 PO Take 200 mg by mouth daily.   . dorzolamide-timolol (COSOPT) 22.3-6.8 MG/ML ophthalmic solution Place 1 drop into both eyes 2 (two) times daily.   Marland Kitchen glucosamine-chondroitin 500-400 MG tablet Take 1 tablet by mouth 2 (two) times daily.  . isosorbide mononitrate (IMDUR) 60 MG 24 hr tablet Take 60 mg by mouth daily.   . naproxen sodium (ANAPROX) 220 MG tablet Take 220-440 mg by mouth daily as needed (pain).  . nitroGLYCERIN (NITROSTAT) 0.4 MG SL tablet Place 0.4 mg under the tongue every 5 (five) minutes as needed for chest pain.   Marland Kitchen OMEGA-3 FATTY ACIDS PO Take 1,200 mcg by mouth 2 (two) times daily.   Marland Kitchen omeprazole (PRILOSEC) 20 MG capsule Take 20 mg by mouth daily. Reported on 08/21/2015  . ondansetron (ZOFRAN) 4 MG tablet Take 1 tablet (4 mg total) by mouth every 8 (eight) hours as needed for nausea or vomiting.  Vladimir Faster Glycol-Propyl Glycol (SYSTANE OP) Apply 1 drop to eye daily as needed (Dry eyes).  . tamsulosin (FLOMAX) 0.4 MG CAPS capsule TAKE 1 CAPSULE BY MOUTH EVERY DAY  . Travoprost, BAK Free, (TRAVATAN Z) 0.004 % SOLN ophthalmic solution Place 1 drop into both eyes at bedtime.   . valsartan-hydrochlorothiazide (DIOVAN HCT) 320-25 MG per tablet Take 1 tablet by mouth daily.   . vitamin B-12 (CYANOCOBALAMIN) 1000 MCG tablet Take 1,000 mcg by mouth daily.    ROS  Constitutional: Denies any fever or chills Gastrointestinal: No reported hemesis, hematochezia, vomiting, or acute  GI distress Musculoskeletal: Denies any acute onset joint swelling, redness, loss of ROM, or weakness Neurological: No reported episodes of acute onset apraxia, aphasia, dysarthria, agnosia, amnesia, paralysis, loss of coordination, or loss of consciousness  Allergies  Mr. Klas is allergic to penicillins.  PFSH  Drug: Mr. Sargeant  reports that he does not use drugs. Alcohol:  reports that he drinks alcohol. Tobacco:  reports that he has quit smoking. His smoking use included Cigarettes. He quit after 15.00 years of use. He has never used smokeless tobacco.  Medical:  has a past medical history of Allergy; Arthritis; Coronary artery disease; GERD (gastroesophageal reflux disease); History of kidney stones; HLD (hyperlipidemia); Hypertension; Myocardial infarction [facility]) (2011); Peripheral vascular disease ([place]); and VHD (valvular heart disease). Surgical: Mr. Dall  has a past surgical history that includes Shoulder surgery (Right); Coronary artery bypass graft (2007); Colonoscopy (N/A, 10/02/2014); Tonsillectomy; Hemorroidectomy; Carpal tunnel release (Bilateral); Lumbar epidural injection (2011); Mouth surgery; Cardiac catheterization (2007); Cardiac catheterization (N/A, 04/17/2015); Knee arthroscopy (Right, 08/05/2016); Knee arthroscopy with medial menisectomy (Right, 08/05/2016); Synovectomy (Right, 08/05/2016); and Knee arthroscopy with lateral release (Right, 08/05/2016). Family: family history includes Heart disease in his brother, brother, brother, brother, father, and mother.  Constitutional Exam  General appearance: Well nourished, well developed, and well hydrated. In no apparent acute distress Vitals:   12/02/16 0811  BP: (!) 127/49  Pulse: 66  Resp: 16  Temp: 98.2 F (36.8 C)  TempSrc: Other (Comment)  SpO2: 99%  Weight: 170 lb (77.1 kg)  Height: '5\' 7"'  (1.702 m)   BMI Assessment: Estimated body mass index is 26.63 kg/m as calculated from the following:   Height as of this encounter:  '5\' 7"'  (1.702 m).   Weight as of this encounter: 170 lb (77.1 kg).  BMI interpretation table: BMI level Category Range association with higher incidence of chronic pain  <18 kg/m2 Underweight   18.5-24.9 kg/m2 Ideal body weight   25-29.9 kg/m2 Overweight Increased incidence by 20%  30-34.9 kg/m2 Obese (Class I) Increased incidence by 68%  35-39.9 kg/m2 Severe obesity (Class II) Increased incidence by 136%  >40 kg/m2 Extreme obesity (Class III) Increased incidence by 254%   BMI Readings from Last 4 Encounters:  12/02/16 26.63 kg/m  11/10/16 26.63 kg/m  11/07/16 27.25 kg/m  09/29/16 27.25 kg/m   Wt Readings from Last 4 Encounters:  12/02/16 170 lb (77.1 kg)  11/10/16 170 lb (77.1 kg)  11/07/16 174 lb (78.9 kg)  09/29/16 174 lb (78.9 kg)  Psych/Mental status: Alert, oriented x 3 (person, place, & time)       Eyes: PERLA Respiratory: No evidence of acute respiratory distress  Cervical Spine Exam  Inspection: No masses, redness, or swelling Alignment: Symmetrical Functional ROM: Unrestricted ROM      Stability: No instability detected Muscle strength & Tone: Functionally intact Sensory: Unimpaired Palpation: No palpable anomalies              Upper Extremity (UE) Exam    Side: Right upper extremity  Side: Left upper extremity  Inspection: No masses, redness, swelling, or asymmetry. No contractures  Inspection: No masses, redness, swelling, or asymmetry. No contractures  Functional ROM: Unrestricted ROM          Functional ROM: Unrestricted ROM          Muscle strength & Tone: Functionally intact  Muscle strength & Tone: Functionally intact  Sensory: Unimpaired  Sensory: Unimpaired  Palpation: No palpable anomalies              Palpation: No palpable anomalies              Specialized Test(s): Deferred         Specialized Test(s): Deferred          Thoracic Spine Exam  Inspection: No masses, redness, or swelling Alignment: Symmetrical Functional ROM: Unrestricted  ROM Stability: No instability detected Sensory: Unimpaired Muscle strength & Tone: No palpable anomalies  Lumbar Spine Exam  Inspection: No masses, redness, or swelling Alignment: Symmetrical Functional ROM: Improved after treatment  Stability: No instability detected Muscle strength & Tone: Functionally intact Sensory: Unimpaired Palpation: No palpable anomalies       Provocative Tests: Lumbar Hyperextension and rotation test: evaluation deferred today       Patrick's Maneuver: evaluation deferred today                    Gait & Posture Assessment  Ambulation: Unassisted Gait: Relatively normal for age and body habitus Posture: WNL   Lower Extremity Exam    Side: Right lower extremity  Side: Left lower extremity  Inspection: No masses, redness, swelling, or asymmetry. No contractures  Inspection: No masses, redness, swelling, or asymmetry. No contractures  Functional ROM: Unrestricted ROM          Functional ROM: Unrestricted ROM          Muscle strength & Tone: Functionally intact  Muscle strength & Tone: Functionally intact  Sensory: Unimpaired  Sensory: Unimpaired  Palpation: No palpable anomalies  Palpation: No palpable anomalies   Assessment  Primary Diagnosis & Pertinent Problem List: The primary encounter diagnosis was Chronic low back pain (Location of Primary Source of Pain) (Bilateral) (R>L). Diagnoses of Spinal stenosis of lumbar region with neurogenic claudication, Lumbar foraminal stenosis (Right L2-3; Bilateral L3-4, L4-5), Lumbar spondylosis, and Chronic pain syndrome were also pertinent to this visit.  Status Diagnosis  Controlled Controlled Controlled 1. Chronic low back pain (Location of Primary Source of Pain) (Bilateral) (R>L)   2. Spinal stenosis of lumbar region with neurogenic claudication   3. Lumbar foraminal stenosis (Right L2-3; Bilateral L3-4, L4-5)   4. Lumbar spondylosis   5. Chronic pain syndrome     Problems updated and reviewed during  this visit: Problem  Chronic Pain Syndrome  Chronic knee pain (Right)   Plan of Care  Pharmacotherapy (Medications Ordered): No orders of the defined types were placed in this encounter.  New Prescriptions   No medications on file   Medications administered today: Mr. Beezley had no medications administered during this visit.  Lab-work, procedure(s), and/or referral(s): Orders Placed This Encounter  Procedures  . Lumbar Epidural Injection    Interventional management options: Planned, scheduled, and/or pending:   Not at this time.   Considering:   Palliative right-sided L4-5 lumbar epidural steroid injection   Palliative bilateral lumbar facet block  #3.  Diagnostic right-sided L2-3 transforaminal epidural steroid injection + bilateral L3-4 and/or L4-5 transforaminal epidural steroid injection     Palliative PRN treatment(s):   Palliative right-sided L4-5 lumbar epidural steroid injection   Palliative bilateral lumbar facet block  #3.  Diagnostic right-sided L2-3 transforaminal epidural steroid injection + bilateral L3-4 and/or L4-5 transforaminal epidural steroid injection       Provider-requested follow-up: Return for PRN procedure(s), by MD.  Future Appointments Date Time Provider [place]  03/05/2017 9:00 AM [facility]-NURSE HEALTH ADVISOR [facility] None  03/05/2017 9:30 AM Austin [name]., MD [facility] None  11/06/2017 8:30 AM [facility] VASC 2 [facility] None  11/06/2017 9:30 AM Dew, Erskine Squibb, MD [facility] None   Primary Care Physician: Austin [name]., MD Location: [facility] Note by: Kathlen Brunswick. Dossie Arbour, M.D, DABA, DABAPM, DABPM, DABIPP, FIPP Date: 12/02/2016; Time: 8:46 AM  Patient Instructions  ____________________________________________________________________________________________  Preparing for Procedure with Sedation Instructions: . Oral Intake: Do not eat or drink anything for at least 8 hours prior to your  procedure. . Transportation: Public transportation is not allowed. Bring an adult driver. The driver must be physically present  in our waiting room before any procedure can be started. Marland Kitchen Physical Assistance: Bring an adult physically capable of assisting you, in the event you need help. This adult should keep you company at home for at least 6 hours after the procedure. . Blood Pressure Medicine: Take your blood pressure medicine with a sip of water the morning of the procedure. . Blood thinners:  . Diabetics on insulin: Notify the staff so that you can be scheduled 1st case in the morning. If your diabetes requires high dose insulin, take only  of your normal insulin dose the morning of the procedure and notify the staff that you have done so. . Preventing infections: Shower with an antibacterial soap the morning of your procedure. . Build-up your immune system: Take 1000 mg of Vitamin C with every meal (3 times a day) the day prior to your procedure. Marland Kitchen Antibiotics: Inform the staff if you have a condition or reason that requires you to take antibiotics before dental procedures. . Pregnancy: If you are pregnant, call and cancel the procedure. . Sickness: If you have a cold, fever, or any active infections, call and cancel the procedure. . Arrival: You must be in the facility at least 30 minutes prior to your scheduled procedure. . Children: Do not bring children with you. . Dress appropriately: Bring dark clothing that you would not mind if they get stained. . Valuables: Do not bring any jewelry or valuables. Procedure appointments are reserved for interventional treatments only. Marland Kitchen No Prescription Refills. . No medication changes will be discussed during procedure appointments. . No disability issues will be discussed. ____________________________________________________________________________________________

## 2016-12-02 ENCOUNTER — Encounter: Payer: Self-pay | Admitting: Pain Medicine

## 2016-12-02 ENCOUNTER — Ambulatory Visit: Payer: Medicare Other | Attending: Pain Medicine | Admitting: Pain Medicine

## 2016-12-02 VITALS — BP 127/49 | HR 66 | Temp 98.2°F | Resp 16 | Ht 67.0 in | Wt 170.0 lb

## 2016-12-02 DIAGNOSIS — K219 Gastro-esophageal reflux disease without esophagitis: Secondary | ICD-10-CM | POA: Insufficient documentation

## 2016-12-02 DIAGNOSIS — I255 Ischemic cardiomyopathy: Secondary | ICD-10-CM | POA: Diagnosis not present

## 2016-12-02 DIAGNOSIS — Z9889 Other specified postprocedural states: Secondary | ICD-10-CM | POA: Diagnosis not present

## 2016-12-02 DIAGNOSIS — M47816 Spondylosis without myelopathy or radiculopathy, lumbar region: Secondary | ICD-10-CM | POA: Diagnosis not present

## 2016-12-02 DIAGNOSIS — Z86010 Personal history of colonic polyps: Secondary | ICD-10-CM | POA: Insufficient documentation

## 2016-12-02 DIAGNOSIS — Z8249 Family history of ischemic heart disease and other diseases of the circulatory system: Secondary | ICD-10-CM | POA: Insufficient documentation

## 2016-12-02 DIAGNOSIS — G8929 Other chronic pain: Secondary | ICD-10-CM | POA: Diagnosis not present

## 2016-12-02 DIAGNOSIS — R7303 Prediabetes: Secondary | ICD-10-CM | POA: Insufficient documentation

## 2016-12-02 DIAGNOSIS — H409 Unspecified glaucoma: Secondary | ICD-10-CM | POA: Diagnosis not present

## 2016-12-02 DIAGNOSIS — M545 Low back pain, unspecified: Secondary | ICD-10-CM

## 2016-12-02 DIAGNOSIS — Z951 Presence of aortocoronary bypass graft: Secondary | ICD-10-CM | POA: Insufficient documentation

## 2016-12-02 DIAGNOSIS — D649 Anemia, unspecified: Secondary | ICD-10-CM | POA: Insufficient documentation

## 2016-12-02 DIAGNOSIS — I10 Essential (primary) hypertension: Secondary | ICD-10-CM | POA: Insufficient documentation

## 2016-12-02 DIAGNOSIS — G894 Chronic pain syndrome: Secondary | ICD-10-CM

## 2016-12-02 DIAGNOSIS — Z7982 Long term (current) use of aspirin: Secondary | ICD-10-CM | POA: Insufficient documentation

## 2016-12-02 DIAGNOSIS — I252 Old myocardial infarction: Secondary | ICD-10-CM | POA: Insufficient documentation

## 2016-12-02 DIAGNOSIS — Z87891 Personal history of nicotine dependence: Secondary | ICD-10-CM | POA: Insufficient documentation

## 2016-12-02 DIAGNOSIS — H269 Unspecified cataract: Secondary | ICD-10-CM | POA: Insufficient documentation

## 2016-12-02 DIAGNOSIS — Z880 Allergy status to penicillin: Secondary | ICD-10-CM | POA: Diagnosis not present

## 2016-12-02 DIAGNOSIS — I219 Acute myocardial infarction, unspecified: Secondary | ICD-10-CM | POA: Diagnosis not present

## 2016-12-02 DIAGNOSIS — M9983 Other biomechanical lesions of lumbar region: Secondary | ICD-10-CM | POA: Diagnosis not present

## 2016-12-02 DIAGNOSIS — I25110 Atherosclerotic heart disease of native coronary artery with unstable angina pectoris: Secondary | ICD-10-CM | POA: Diagnosis not present

## 2016-12-02 DIAGNOSIS — E785 Hyperlipidemia, unspecified: Secondary | ICD-10-CM | POA: Diagnosis not present

## 2016-12-02 DIAGNOSIS — M48062 Spinal stenosis, lumbar region with neurogenic claudication: Secondary | ICD-10-CM | POA: Diagnosis not present

## 2016-12-02 DIAGNOSIS — H16122 Filamentary keratitis, left eye: Secondary | ICD-10-CM | POA: Diagnosis not present

## 2016-12-02 DIAGNOSIS — M48061 Spinal stenosis, lumbar region without neurogenic claudication: Secondary | ICD-10-CM

## 2016-12-02 DIAGNOSIS — Z87442 Personal history of urinary calculi: Secondary | ICD-10-CM | POA: Diagnosis not present

## 2016-12-02 NOTE — Progress Notes (Signed)
Safety precautions to be maintained throughout the outpatient stay will include: orient to surroundings, keep bed in low position, maintain call bell within reach at all times, provide assistance with transfer out of bed and ambulation.  

## 2016-12-02 NOTE — Patient Instructions (Signed)

## 2016-12-30 ENCOUNTER — Ambulatory Visit (HOSPITAL_BASED_OUTPATIENT_CLINIC_OR_DEPARTMENT_OTHER): Payer: Medicare Other | Admitting: Pain Medicine

## 2016-12-30 ENCOUNTER — Encounter: Payer: Self-pay | Admitting: Pain Medicine

## 2016-12-30 ENCOUNTER — Ambulatory Visit
Admission: RE | Admit: 2016-12-30 | Discharge: 2016-12-30 | Disposition: A | Discharge status: Home / Self Care | Payer: Medicare Other | Source: Ambulatory Visit | Attending: Pain Medicine | Admitting: Pain Medicine

## 2016-12-30 VITALS — BP 126/56 | HR 62 | Temp 98.1°F | Resp 18 | Ht 67.0 in | Wt 170.0 lb

## 2016-12-30 DIAGNOSIS — M48062 Spinal stenosis, lumbar region with neurogenic claudication: Secondary | ICD-10-CM | POA: Insufficient documentation

## 2016-12-30 DIAGNOSIS — M47816 Spondylosis without myelopathy or radiculopathy, lumbar region: Secondary | ICD-10-CM | POA: Diagnosis not present

## 2016-12-30 DIAGNOSIS — M545 Low back pain, unspecified: Secondary | ICD-10-CM

## 2016-12-30 DIAGNOSIS — M9983 Other biomechanical lesions of lumbar region: Secondary | ICD-10-CM | POA: Diagnosis not present

## 2016-12-30 DIAGNOSIS — G8929 Other chronic pain: Secondary | ICD-10-CM | POA: Insufficient documentation

## 2016-12-30 DIAGNOSIS — M48061 Spinal stenosis, lumbar region without neurogenic claudication: Secondary | ICD-10-CM

## 2016-12-30 DIAGNOSIS — Z880 Allergy status to penicillin: Secondary | ICD-10-CM | POA: Insufficient documentation

## 2016-12-30 MED ORDER — IOPAMIDOL (ISOVUE-M 200) INJECTION 41%
10.0000 mL | Freq: Once | INTRAMUSCULAR | Status: AC
  Administered 2016-12-30: 10 mL via EPIDURAL
  Filled 2016-12-30: qty 10

## 2016-12-30 MED ORDER — ROPIVACAINE HCL 2 MG/ML IJ SOLN
2.0000 mL | Freq: Once | INTRAMUSCULAR | Status: AC
  Administered 2016-12-30: 10 mL via EPIDURAL
  Filled 2016-12-30: qty 10

## 2016-12-30 MED ORDER — TRIAMCINOLONE ACETONIDE 40 MG/ML IJ SUSP
40.0000 mg | Freq: Once | INTRAMUSCULAR | Status: AC
  Administered 2016-12-30: 40 mg
  Filled 2016-12-30: qty 1

## 2016-12-30 MED ORDER — SODIUM CHLORIDE 0.9% FLUSH
2.0000 mL | Freq: Once | INTRAVENOUS | Status: AC
  Administered 2016-12-30: 10 mL

## 2016-12-30 MED ORDER — LIDOCAINE HCL (PF) 1 % IJ SOLN
5.0000 mL | Freq: Once | INTRAMUSCULAR | Status: AC
  Administered 2016-12-30: 5 mL
  Filled 2016-12-30: qty 5

## 2016-12-30 NOTE — Progress Notes (Signed)
Patient's Name: Austin [name]  MRN: [id number]  Referring Provider: Jerrol Banana.,*  DOB: 20-Dec-1928  PCP: Jerrol Banana., MD  DOS: 12/30/2016  Note by: Gaspar Cola, MD  Service setting: Ambulatory outpatient  Specialty: Interventional Pain Management  Patient type: Established  Location: [facility] (AMB) Pain Management Facility  Visit type: Interventional Procedure   Primary Reason for Visit: Interventional Pain Management Treatment. CC: Back Pain (low) and Leg Pain (left and posterior to knee)  Procedure:  Anesthesia, Analgesia, Anxiolysis:  Type: Therapeutic Inter-Laminar Epidural Steroid Injection Region: Lumbar Level: L3-4 Level. Laterality: Right-Sided         Type: Local Anesthesia Local Anesthetic: Lidocaine 1% Route: Infiltration ([place]/IM) IV Access: Declined Sedation: Declined  Indication(s): Analgesia          Indications: 1. Spinal stenosis of lumbar region with neurogenic claudication   2. Lumbar foraminal stenosis (Right L2-3; Bilateral L3-4, L4-5)   3. Lumbar spondylosis   4. Chronic low back pain (Location of Primary Source of Pain) (Bilateral) (R>L)    Pain Score: Pre-procedure: 6 /10 Post-procedure: 0-No pain/10  Pre-op Assessment:  Previous date of service: 12/02/16 Service provided: Evaluation Mr. Mullane is a 83 y.o. (year old), male patient, seen today for interventional treatment. He  has a past surgical history that includes Shoulder surgery (Right); Coronary artery bypass graft (2007); Colonoscopy (N/A, 10/02/2014); Tonsillectomy; Hemorroidectomy; Carpal tunnel release (Bilateral); Lumbar epidural injection (2011); Mouth surgery; Cardiac catheterization (2007); Cardiac catheterization (N/A, 04/17/2015); Knee arthroscopy (Right, 08/05/2016); Knee arthroscopy with medial menisectomy (Right, 08/05/2016); Synovectomy (Right, 08/05/2016); and Knee arthroscopy with lateral release (Right, 08/05/2016). Mr. Nettleton has a current medication list which includes the  following prescription(s): amlodipine, aspirin ec, atorvastatin, biotin, vitamin d3, coenzyme q10, dorzolamide-timolol, glucosamine-chondroitin, isosorbide mononitrate, naproxen sodium, nitroglycerin, omega-3 fatty acids, omeprazole, ondansetron, polyethyl glycol-propyl glycol, tamsulosin, travoprost (bak free), valsartan-hydrochlorothiazide, and vitamin b-12. His primarily concern today is the Back Pain (low) and Leg Pain (left and posterior to knee)  Initial Vital Signs: Blood pressure (!) 136/51, pulse 68, temperature 98.1 F (36.7 C), temperature source Oral, resp. rate 18, height 5\' 7"  (1.702 m), weight 170 lb (77.1 kg), SpO2 99 %. BMI: Estimated body mass index is 26.63 kg/m as calculated from the following:   Height as of this encounter: 5\' 7"  (1.702 m).   Weight as of this encounter: 170 lb (77.1 kg).  Risk Assessment: Allergies: Reviewed. He is allergic to penicillins.  Allergy Precautions: None required Coagulopathies: Reviewed. None identified.  Blood-thinner therapy: None at this time Active Infection(s): Reviewed. None identified. Mr. Jirak is afebrile  Site Confirmation: Mr. Skilling was asked to confirm the procedure and laterality before marking the site Procedure checklist: Completed Consent: Before the procedure and under the influence of no sedative(s), amnesic(s), or anxiolytics, the patient was informed of the treatment options, risks and possible complications. To fulfill our ethical and legal obligations, as recommended by the American Medical Association's Code of Ethics, I have informed the patient of my clinical impression; the nature and purpose of the treatment or procedure; the risks, benefits, and possible complications of the intervention; the alternatives, including doing nothing; the risk(s) and benefit(s) of the alternative treatment(s) or procedure(s); and the risk(s) and benefit(s) of doing nothing. The patient was provided information about the general risks and  possible complications associated with the procedure. These may include, but are not limited to: failure to achieve desired goals, infection, bleeding, organ or nerve damage, allergic reactions, paralysis, and death. In addition,  the patient was informed of those risks and complications associated to Spine-related procedures, such as failure to decrease pain; infection (i.e.: Meningitis, epidural or intraspinal abscess); bleeding (i.e.: epidural hematoma, subarachnoid hemorrhage, or any other type of intraspinal or peri-dural bleeding); organ or nerve damage (i.e.: Any type of peripheral nerve, nerve root, or spinal cord injury) with subsequent damage to sensory, motor, and/or autonomic systems, resulting in permanent pain, numbness, and/or weakness of one or several areas of the body; allergic reactions; (i.e.: anaphylactic reaction); and/or death. Furthermore, the patient was informed of those risks and complications associated with the medications. These include, but are not limited to: allergic reactions (i.e.: anaphylactic or anaphylactoid reaction(s)); adrenal axis suppression; blood sugar elevation that in diabetics may result in ketoacidosis or comma; water retention that in patients with history of congestive heart failure may result in shortness of breath, pulmonary edema, and decompensation with resultant heart failure; weight gain; swelling or edema; medication-induced neural toxicity; particulate matter embolism and blood vessel occlusion with resultant organ, and/or nervous system infarction; and/or aseptic necrosis of one or more joints. Finally, the patient was informed that Medicine is not an exact science; therefore, there is also the possibility of unforeseen or unpredictable risks and/or possible complications that may result in a catastrophic outcome. The patient indicated having understood very clearly. We have given the patient no guarantees and we have made no promises. Enough time was  given to the patient to ask questions, all of which were answered to the patient's satisfaction. Mr. Purdom has indicated that he wanted to continue with the procedure. Attestation: I, the ordering provider, attest that I have discussed with the patient the benefits, risks, side-effects, alternatives, likelihood of achieving goals, and potential problems during recovery for the procedure that I have provided informed consent. Date: 12/30/2016; Time: 10:51 AM  Pre-Procedure Preparation:  Monitoring: As per clinic protocol. Respiration, ETCO2, SpO2, BP, heart rate and rhythm monitor placed and checked for adequate function Safety Precautions: Patient was assessed for positional comfort and pressure points before starting the procedure. Time-out: I initiated and conducted the "Time-out" before starting the procedure, as per protocol. The patient was asked to participate by confirming the accuracy of the "Time Out" information. Verification of the correct person, site, and procedure were performed and confirmed by me, the nursing staff, and the patient. "Time-out" conducted as per Joint Commission's Universal Protocol (UP.01.01.01). "Time-out" Date & Time: 12/30/2016; 1127 hrs.  Description of Procedure Process:   Position: Prone with head of the table was raised to facilitate breathing. Target Area: The interlaminar space, initially targeting the lower laminar border of the superior vertebral body. Approach: Paramedial approach. Area Prepped: Entire Posterior Lumbar Region Prepping solution: ChloraPrep (2% chlorhexidine gluconate and 70% isopropyl alcohol) Safety Precautions: Aspiration looking for blood return was conducted prior to all injections. At no point did we inject any substances, as a needle was being advanced. No attempts were made at seeking any paresthesias. Safe injection practices and needle disposal techniques used. Medications properly checked for expiration dates. SDV (single dose vial)  medications used. Description of the Procedure: Protocol guidelines were followed. The procedure needle was introduced through the skin, ipsilateral to the reported pain, and advanced to the target area. Bone was contacted and the needle walked caudad, until the lamina was cleared. The epidural space was identified using "loss-of-resistance technique" with 2-3 ml of PF-NaCl (0.9% NSS), in a 5cc LOR glass syringe. Vitals:   12/30/16 1044 12/30/16 1129 12/30/16 1134 12/30/16 1139  BP: Marland Kitchen)  136/51 (!) 150/73  (!) 126/56  Pulse: 68 63 73 62  Resp: 18 17 (!) 21 18  Temp: 98.1 F (36.7 C)     TempSrc: Oral     SpO2: 99% 99% 99% 99%  Weight: 170 lb (77.1 kg)     Height: 5\' 7"  (1.702 m)       Start Time: 1128 hrs. End Time: 1136 hrs. Materials:  Needle(s) Type: Epidural needle Gauge: 17G Length: 3.5-in Medication(s): We administered iopamidol, triamcinolone acetonide, ropivacaine (PF) 2 mg/mL (0.2%), sodium chloride flush, and lidocaine (PF). Please see chart orders for dosing details.  Imaging Guidance (Spinal):  Type of Imaging Technique: Fluoroscopy Guidance (Spinal) Indication(s): Assistance in needle guidance and placement for procedures requiring needle placement in or near specific anatomical locations not easily accessible without such assistance. Exposure Time: Please see nurses notes. Contrast: Before injecting any contrast, we confirmed that the patient did not have an allergy to iodine, shellfish, or radiological contrast. Once satisfactory needle placement was completed at the desired level, radiological contrast was injected. Contrast injected under live fluoroscopy. No contrast complications. See chart for type and volume of contrast used. Fluoroscopic Guidance: I was personally present during the use of fluoroscopy. "Tunnel Vision Technique" used to obtain the best possible view of the target area. Parallax error corrected before commencing the procedure. "Direction-depth-direction"  technique used to introduce the needle under continuous pulsed fluoroscopy. Once target was reached, antero-posterior, oblique, and lateral fluoroscopic projection used confirm needle placement in all planes. Images permanently stored in EMR. Interpretation: I personally interpreted the imaging intraoperatively. Adequate needle placement confirmed in multiple planes. Appropriate spread of contrast into desired area was observed. No evidence of afferent or efferent intravascular uptake. No intrathecal or subarachnoid spread observed. Permanent images saved into the patient's record.  Antibiotic Prophylaxis:  Indication(s): None identified Antibiotic given: None  Post-operative Assessment:  EBL: None Complications: No immediate post-treatment complications observed by team, or reported by patient. Note: The patient tolerated the entire procedure well. A repeat set of vitals were taken after the procedure and the patient was kept under observation following institutional policy, for this type of procedure. Post-procedural neurological assessment was performed, showing return to baseline, prior to discharge. The patient was provided with post-procedure discharge instructions, including a section on how to identify potential problems. Should any problems arise concerning this procedure, the patient was given instructions to immediately contact us, at any time, without hesitation. In any case, we plan to contact the patient by telephone for a follow-up status report regarding this interventional procedure. Comments:  No additional relevant information.  Plan of Care  Disposition: Discharge home  Discharge Date & Time: 12/30/2016; 1145 hrs.  Physician-requested Follow-up:  Return for post-procedure eval by Dr. Dossie Arbour in 2 weeks.  Future Appointments Date Time Provider [place]  01/21/2017 11:00 AM Milinda Pointer, MD [facility] None  03/05/2017 9:00 AM [facility] HEALTH ADVISOR [facility] None   03/05/2017 9:30 AM Jerrol Banana., MD [facility] None  11/06/2017 8:30 AM [facility] None  11/06/2017 9:30 AM Dew, Erskine Squibb, MD [facility] None   Medications ordered for procedure: Meds ordered this encounter  Medications  . iopamidol (ISOVUE-M) 41 % intrathecal injection 10 mL  . triamcinolone acetonide (KENALOG-40) injection 40 mg  . ropivacaine (PF) 2 mg/mL (0.2%) (NAROPIN) injection 2 mL  . sodium chloride flush (NS) 0.9 % injection 2 mL  . lidocaine (PF) (XYLOCAINE) 1 % injection 5 mL   Medications administered: We administered iopamidol,  triamcinolone acetonide, ropivacaine (PF) 2 mg/mL (0.2%), sodium chloride flush, and lidocaine (PF).  See the medical record for exact dosing, route, and time of administration.  Lab-work, Procedure(s), & Referral(s) Ordered: Orders Placed This Encounter  Procedures  . Lumbar Epidural Injection  . DG C-Arm 1-60 Min-No Report  . Informed Consent Details: Transcribe to consent form and obtain patient signature  . Provider attestation of informed consent for procedure/surgical case  . Verify informed consent  . Discharge instructions  . Follow-up   Imaging Ordered: Results for orders placed in visit on 11/10/16  DG C-Arm 1-60 Min-No Report   Narrative Fluoroscopy was utilized by the requesting physician.  No radiographic  interpretation.    New Prescriptions   No medications on file   Primary Care Physician: Jerrol Banana., MD Location: [facility] Note by: Gaspar Cola, MD Date: 12/30/2016; Time: 6:30 PM  Disclaimer:  Medicine is not an Chief Strategy Officer. The only guarantee in medicine is that nothing is guaranteed. It is important to note that the decision to proceed with this intervention was based on the information collected from the patient. The Data and conclusions were drawn from the patient's questionnaire, the interview, and the physical examination. Because the information was  provided in large part by the patient, it cannot be guaranteed that it has not been purposely or unconsciously manipulated. Every effort has been made to obtain as much relevant data as possible for this evaluation. It is important to note that the conclusions that lead to this procedure are derived in large part from the available data. Always take into account that the treatment will also be dependent on availability of resources and existing treatment guidelines, considered by other Pain Management Practitioners as being common knowledge and practice, at the time of the intervention. For Medico-Legal purposes, it is also important to point out that variation in procedural techniques and pharmacological choices are the acceptable norm. The indications, contraindications, technique, and results of the above procedure should only be interpreted and judged by a Board-Certified Interventional Pain Specialist with extensive familiarity and expertise in the same exact procedure and technique.  Instructions provided at this appointment: Patient Instructions  ____________________________________________________________________________________________  Post-Procedure instructions Instructions:  Apply ice: Fill a plastic sandwich bag with crushed ice. Cover it with a small towel and apply to injection site. Apply for 15 minutes then remove x 15 minutes. Repeat sequence on day of procedure, until you go to bed. The purpose is to minimize swelling and discomfort after procedure.  Apply heat: Apply heat to procedure site starting the day following the procedure. The purpose is to treat any soreness and discomfort from the procedure.  Food intake: Start with clear liquids (like water) and advance to regular food, as tolerated.   Physical activities: Keep activities to a minimum for the first 8 hours after the procedure.   Driving: If you have received any sedation, you are not allowed to drive for 24 hours  after your procedure.  Blood thinner: Restart your blood thinner 6 hours after your procedure. (Only for those taking blood thinners)  Insulin: As soon as you can eat, you may resume your normal dosing schedule. (Only for those taking insulin)  Infection prevention: Keep procedure site clean and dry.  Post-procedure Pain Diary: Extremely important that this be done correctly and accurately. Recorded information will be used to determine the next step in treatment.  Pain evaluated is that of treated area only. Do not include  pain from an untreated area.  Complete every hour, on the hour, for the initial 8 hours. Set an alarm to help you do this part accurately.  Do not go to sleep and have it completed later. It will not be accurate.  Follow-up appointment: Keep your follow-up appointment after the procedure. Usually 2 weeks for most procedures. (6 weeks in the case of radiofrequency.) Bring you pain diary.  Expect:  From numbing medicine (AKA: Local Anesthetics): Numbness or decrease in pain.  Onset: Full effect within 15 minutes of injected.  Duration: It will depend on the type of local anesthetic used. On the average, 1 to 8 hours.   From steroids: Decrease in swelling or inflammation. Once inflammation is improved, relief of the pain will follow.  Onset of benefits: Depends on the amount of swelling present. The more swelling, the longer it will take for the benefits to be seen. In some cases, up to 10 days.  Duration: Steroids will stay in the system x 2 weeks. Duration of benefits will depend on multiple posibilities including persistent irritating factors.  From procedure: Some discomfort is to be expected once the numbing medicine wears off. This should be minimal if ice and heat are applied as instructed. Call if:  You experience numbness and weakness that gets worse with time, as opposed to wearing off.  New onset bowel or bladder incontinence. (Spinal procedures  only)  Emergency Numbers:  [place] business hours (Monday - Thursday, 8:00 AM - 4:00 PM) (Friday, 9:00 AM - 12:00 Noon): [phone]  After hours: [phone]  Please complete your Post Procedure Diary and bring it to your follow-up appointment. ____________________________________________________________________________________________

## 2016-12-30 NOTE — Patient Instructions (Addendum)
____________________________________________________________________________________________  Post-Procedure instructions Instructions:  Apply ice: Fill a plastic sandwich bag with crushed ice. Cover it with a small towel and apply to injection site. Apply for 15 minutes then remove x 15 minutes. Repeat sequence on day of procedure, until you go to bed. The purpose is to minimize swelling and discomfort after procedure.  Apply heat: Apply heat to procedure site starting the day following the procedure. The purpose is to treat any soreness and discomfort from the procedure.  Food intake: Start with clear liquids (like water) and advance to regular food, as tolerated.   Physical activities: Keep activities to a minimum for the first 8 hours after the procedure.   Driving: If you have received any sedation, you are not allowed to drive for 24 hours after your procedure.  Blood thinner: Restart your blood thinner 6 hours after your procedure. (Only for those taking blood thinners)  Insulin: As soon as you can eat, you may resume your normal dosing schedule. (Only for those taking insulin)  Infection prevention: Keep procedure site clean and dry.  Post-procedure Pain Diary: Extremely important that this be done correctly and accurately. Recorded information will be used to determine the next step in treatment.  Pain evaluated is that of treated area only. Do not include pain from an untreated area.  Complete every hour, on the hour, for the initial 8 hours. Set an alarm to help you do this part accurately.  Do not go to sleep and have it completed later. It will not be accurate.  Follow-up appointment: Keep your follow-up appointment after the procedure. Usually 2 weeks for most procedures. (6 weeks in the case of radiofrequency.) Bring you pain diary.  Expect:  From numbing medicine (AKA: Local Anesthetics): Numbness or decrease in pain.  Onset: Full effect within 15 minutes of  injected.  Duration: It will depend on the type of local anesthetic used. On the average, 1 to 8 hours.   From steroids: Decrease in swelling or inflammation. Once inflammation is improved, relief of the pain will follow.  Onset of benefits: Depends on the amount of swelling present. The more swelling, the longer it will take for the benefits to be seen. In some cases, up to 10 days.  Duration: Steroids will stay in the system x 2 weeks. Duration of benefits will depend on multiple posibilities including persistent irritating factors.  From procedure: Some discomfort is to be expected once the numbing medicine wears off. This should be minimal if ice and heat are applied as instructed. Call if:  You experience numbness and weakness that gets worse with time, as opposed to wearing off.  New onset bowel or bladder incontinence. (Spinal procedures only)  Emergency Numbers:  [place] business hours (Monday - Thursday, 8:00 AM - 4:00 PM) (Friday, 9:00 AM - 12:00 Noon): [phone]  After hours: [phone]  Please complete your Post Procedure Diary and bring it to your follow-up appointment. ____________________________________________________________________________________________

## 2016-12-30 NOTE — Progress Notes (Signed)
Safety precautions to be maintained throughout the outpatient stay will include: orient to surroundings, keep bed in low position, maintain call bell within reach at all times, provide assistance with transfer out of bed and ambulation.  

## 2016-12-31 ENCOUNTER — Telehealth: Payer: Self-pay

## 2016-12-31 NOTE — Telephone Encounter (Signed)
Denies any needs at this time "I am a lot better today than I was yesterday."

## 2017-01-09 DIAGNOSIS — H353132 Nonexudative age-related macular degeneration, bilateral, intermediate dry stage: Secondary | ICD-10-CM | POA: Diagnosis not present

## 2017-01-12 DIAGNOSIS — M25561 Pain in right knee: Secondary | ICD-10-CM | POA: Diagnosis not present

## 2017-01-14 DIAGNOSIS — I25810 Atherosclerosis of coronary artery bypass graft(s) without angina pectoris: Secondary | ICD-10-CM | POA: Diagnosis not present

## 2017-01-14 DIAGNOSIS — R238 Other skin changes: Secondary | ICD-10-CM | POA: Insufficient documentation

## 2017-01-14 DIAGNOSIS — I6523 Occlusion and stenosis of bilateral carotid arteries: Secondary | ICD-10-CM | POA: Diagnosis not present

## 2017-01-14 DIAGNOSIS — I10 Essential (primary) hypertension: Secondary | ICD-10-CM | POA: Diagnosis not present

## 2017-01-14 DIAGNOSIS — R233 Spontaneous ecchymoses: Secondary | ICD-10-CM | POA: Insufficient documentation

## 2017-01-19 DIAGNOSIS — M25561 Pain in right knee: Secondary | ICD-10-CM | POA: Diagnosis not present

## 2017-01-21 ENCOUNTER — Ambulatory Visit: Payer: Medicare Other | Admitting: Pain Medicine

## 2017-01-21 DIAGNOSIS — M1711 Unilateral primary osteoarthritis, right knee: Secondary | ICD-10-CM | POA: Diagnosis not present

## 2017-01-21 DIAGNOSIS — M25561 Pain in right knee: Secondary | ICD-10-CM | POA: Diagnosis not present

## 2017-01-28 ENCOUNTER — Telehealth: Payer: Self-pay | Admitting: Family Medicine

## 2017-01-28 NOTE — Telephone Encounter (Signed)
Pt returned Ana's call regarding his surgery date/  His call back is [phone]  Thanks teri

## 2017-01-28 NOTE — Telephone Encounter (Signed)
Spoke with patient. Surgery scheduled for 03/06/17. Appointment made for surgical clearance to see [place] on 02/09/17-aa

## 2017-01-28 NOTE — Telephone Encounter (Signed)
[id number] 

## 2017-01-29 ENCOUNTER — Ambulatory Visit: Payer: Medicare Other | Attending: Pain Medicine | Admitting: Pain Medicine

## 2017-01-29 ENCOUNTER — Encounter: Payer: Self-pay | Admitting: Pain Medicine

## 2017-01-29 VITALS — BP 141/55 | HR 98 | Temp 98.0°F | Resp 16 | Ht 67.0 in | Wt 170.0 lb

## 2017-01-29 DIAGNOSIS — M5416 Radiculopathy, lumbar region: Secondary | ICD-10-CM

## 2017-01-29 DIAGNOSIS — M4310 Spondylolisthesis, site unspecified: Secondary | ICD-10-CM

## 2017-01-29 DIAGNOSIS — M48062 Spinal stenosis, lumbar region with neurogenic claudication: Secondary | ICD-10-CM | POA: Diagnosis not present

## 2017-01-29 DIAGNOSIS — G8929 Other chronic pain: Secondary | ICD-10-CM

## 2017-01-29 DIAGNOSIS — M545 Low back pain: Secondary | ICD-10-CM

## 2017-01-29 DIAGNOSIS — M48061 Spinal stenosis, lumbar region without neurogenic claudication: Secondary | ICD-10-CM

## 2017-01-29 DIAGNOSIS — M9983 Other biomechanical lesions of lumbar region: Secondary | ICD-10-CM

## 2017-01-29 NOTE — Progress Notes (Signed)
Safety precautions to be maintained throughout the outpatient stay will include: orient to surroundings, keep bed in low position, maintain call bell within reach at all times, provide assistance with transfer out of bed and ambulation.  

## 2017-01-29 NOTE — Progress Notes (Signed)
Patient's Name: Austin [name]  MRN: [id number]  Referring Provider: Jerrol Banana.,*  DOB: Jun 09, 1928  PCP: Jerrol Banana., MD  DOS: 01/29/2017  Note by: Gaspar Cola, MD  Service setting: Ambulatory outpatient  Specialty: Interventional Pain Management  Location: [facility] (AMB) Pain Management Facility    Patient type: Established   Primary Reason(s) for Visit: Encounter for post-procedure evaluation of chronic illness with mild to moderate exacerbation CC: Back Pain (lower, both sides, worse on right side)  HPI  Austin [name] is a 83 y.o. year old, male patient, who comes today for a post-procedure evaluation. He has History of colonic polyps; Benign essential HTN; Benign fibroma of prostate; Atherosclerosis of coronary artery; Cataract; Esophageal stenosis; Acid reflux; Glaucoma; Hemorrhoid; HLD (hyperlipidemia); Obstructive apnea; Borderline diabetes; Esophagogastric ring; Heart valve disease; MI (mitral incompetence); Cardiomyopathy, ischemic; Coronary artery disease involving native coronary artery of native heart with unstable angina pectoris ([place]); Chronic low back pain (Location of Primary Source of Pain) (Bilateral) (R>L); Lumbar spondylosis; Lumbar facet syndrome (Bilateral) (R>L); Peripheral vascular disease ([place]); Lumbar facet hypertrophy (multilevel); Grade 1 Anterolisthesis of L3 over L4; Lumbar foraminal stenosis (Right L2-3; Bilateral L3-4, L4-5); Carotid stenosis; Chronic knee pain (Right); Full thickness rotator cuff tear; Stiffness of shoulder joint; Tear of medial meniscus of knee; Weakness of limb; Osteoarthritis of knee (Right); Chronic pain syndrome; Lumbar central spinal stenosis (Severe at L4-5); Chronic Lumbar radiculitis (intermittent/recurrent) (Bilateral); and Easy bruising on his problem list. His primarily concern today is the Back Pain (lower, both sides, worse on right side)  Pain Assessment: Location: Lower Back Radiating: right buttock Onset: More  than a month ago Duration: Chronic pain Quality: Sore Severity: 1 /10 (self-reported pain score)  Note: Reported level is compatible with observation.                   Effect on ADL:   Timing: Intermittent Modifying factors: sitting still  Austin [name] comes in today for post-procedure evaluation after the treatment done on 12/30/2016.  Further details on both, my assessment(s), as well as the proposed treatment plan, please see below.  Post-Procedure Assessment  12/30/2016 Procedure: Therapeutic right-sided L3-4 interlaminar lumbar epidural steroid injection under fluoroscopic guidance, no sedation Pre-procedure pain score:  6/10 Post-procedure pain score: 0/10 (100% relief) Influential Factors: BMI: 26.63 kg/m Intra-procedural challenges: None observed.         Assessment challenges: None detected.              Reported side-effects: None.        Post-procedural adverse reactions or complications: None reported         Sedation: Sedation provided. When no sedatives are used, the analgesic levels obtained are directly associated to the effectiveness of the local anesthetics. However, when sedation is provided, the level of analgesia obtained during the initial 1 hour following the intervention, is believed to be the result of a combination of factors. These factors may include, but are not limited to: 1. The effectiveness of the local anesthetics used. 2. The effects of the analgesic(s) and/or anxiolytic(s) used. 3. The degree of discomfort experienced by the patient at the time of the procedure. 4. The patients ability and reliability in recalling and recording the events. 5. The presence and influence of possible secondary gains and/or psychosocial factors. Reported result: Relief experienced during the 1st hour after the procedure: 100 % (Ultra-Short Term Relief) Austin [name] has indicated area to have been numb during this time. Interpretative annotation: Clinically  appropriate  result. Analgesia during this period is likely to be Local Anesthetic and/or IV Sedative (Analgesic/Anxiolytic) related.          Effects of local anesthetic: The analgesic effects attained during this period are directly associated to the localized infiltration of local anesthetics and therefore cary significant diagnostic value as to the etiological location, or anatomical origin, of the pain. Expected duration of relief is directly dependent on the pharmacodynamics of the local anesthetic used. Long-acting (4-6 hours) anesthetics used.  Reported result: Relief during the next 4 to 6 hour after the procedure: 90 % (Short-Term Relief)            Interpretative annotation: Clinically appropriate result. Analgesia during this period is likely to be Local Anesthetic-related.          Long-term benefit: Defined as the period of time past the expected duration of local anesthetics (1 hour for short-acting and 4-6 hours for long-acting). With the possible exception of prolonged sympathetic blockade from the local anesthetics, benefits during this period are typically attributed to, or associated with, other factors such as analgesic sensory neuropraxia, antiinflammatory effects, or beneficial biochemical changes provided by agents other than the local anesthetics.  Reported result: Extended relief following procedure: 85 % (Long-Term Relief)            Interpretative annotation: Clinically appropriate result. Good relief. No permanent benefit expected. Inflammation plays a part in the etiology to the pain. Benefit believed to be steroid-related.  Current benefits: Defined as persistent relief that continues at this point in time.   Reported results: Treated area: 85 % Austin [name] reports improvement in function Interpretative annotation: Recurrence of symptoms. No permanent benefit expected. Effective therapeutic approach. Benefit believed to be steroid-related.  Interpretation: Results would suggest a  successful palliative intervention.                  Plan:  Set up procedure as a PRN palliative treatment option for this patient.  Laboratory Chemistry  Inflammation Markers (CRP: Acute Phase) (ESR: Chronic Phase) No results found for: CRP, ESRSEDRATE               Renal Function Markers Lab Results  Component Value Date   BUN 23 (H) 07/28/2016   CREATININE 1.51 (H) 07/28/2016   GFRAA 47 (L) 07/28/2016   GFRNONAA 40 (L) 07/28/2016                 Hepatic Function Markers Lab Results  Component Value Date   AST 21 02/27/2016   ALT 29 02/27/2016   ALBUMIN 4.4 02/27/2016   ALKPHOS 81 02/27/2016                 Electrolytes Lab Results  Component Value Date   NA 134 (L) 07/28/2016   K 4.0 07/28/2016   CL 101 07/28/2016   CALCIUM 8.9 07/28/2016                 Neuropathy Markers No results found for: [id number]               Bone Pathology Markers Lab Results  Component Value Date   ALKPHOS 81 02/27/2016   CALCIUM 8.9 07/28/2016                 Coagulation Parameters Lab Results  Component Value Date   INR 1.03 07/28/2016   LABPROT 13.5 07/28/2016   APTT 41 (H) 07/28/2016   PLT 168 07/28/2016  Cardiovascular Markers Lab Results  Component Value Date   HGB 13.6 07/28/2016   HCT 39.0 (L) 07/28/2016                 Note: Lab results reviewed.  Recent Diagnostic Imaging Review  Dg C-arm 1-60 Min-no Report  Result Date: 12/30/2016 Fluoroscopy was utilized by the requesting physician.  No radiographic interpretation.   Note: Imaging results reviewed.          Meds   Current Outpatient Prescriptions:  .  amLODipine (NORVASC) 5 MG tablet, Take 5 mg by mouth daily., Disp: , Rfl:  .  aspirin EC 81 MG tablet, Take 81 mg by mouth daily., Disp: , Rfl:  .  atorvastatin (LIPITOR) 20 MG tablet, Take 20 mg by mouth every other day. , Disp: , Rfl:  .  Biotin 5000 MCG CAPS, Take 5,000 mcg by mouth daily. , Disp: , Rfl:  .  Cholecalciferol  (VITAMIN D3) 2000 UNITS capsule, Take 2,000 Units by mouth 2 (two) times daily. , Disp: , Rfl:  .  COENZYME Q-10 PO, Take 200 mg by mouth daily. , Disp: , Rfl:  .  dorzolamide-timolol (COSOPT) 22.3-6.8 MG/ML ophthalmic solution, Place 1 drop into both eyes 2 (two) times daily. , Disp: , Rfl:  .  glucosamine-chondroitin 500-400 MG tablet, Take 1 tablet by mouth 2 (two) times daily., Disp: , Rfl:  .  isosorbide mononitrate (IMDUR) 60 MG 24 hr tablet, Take 60 mg by mouth daily. , Disp: , Rfl:  .  naproxen sodium (ANAPROX) 220 MG tablet, Take 220-440 mg by mouth daily as needed (pain)., Disp: , Rfl:  .  nitroGLYCERIN (NITROSTAT) 0.4 MG SL tablet, Place 0.4 mg under the tongue every 5 (five) minutes as needed for chest pain. , Disp: , Rfl:  .  OMEGA-3 FATTY ACIDS PO, Take 1,200 mcg by mouth 2 (two) times daily. , Disp: , Rfl:  .  omeprazole (PRILOSEC) 20 MG capsule, Take 20 mg by mouth daily. Reported on 08/21/2015, Disp: , Rfl:  .  ondansetron (ZOFRAN) 4 MG tablet, Take 1 tablet (4 mg total) by mouth every 8 (eight) hours as needed for nausea or vomiting., Disp: 30 tablet, Rfl: 0 .  Polyethyl Glycol-Propyl Glycol (SYSTANE OP), Apply 1 drop to eye daily as needed (Dry eyes)., Disp: , Rfl:  .  tamsulosin (FLOMAX) 0.4 MG CAPS capsule, TAKE 1 CAPSULE BY MOUTH EVERY DAY, Disp: 30 capsule, Rfl: 11 .  Travoprost, BAK Free, (TRAVATAN Z) 0.004 % SOLN ophthalmic solution, Place 1 drop into both eyes at bedtime. , Disp: , Rfl:  .  valsartan-hydrochlorothiazide (DIOVAN HCT) 320-25 MG per tablet, Take 1 tablet by mouth daily. , Disp: , Rfl:  .  vitamin B-12 (CYANOCOBALAMIN) 1000 MCG tablet, Take 1,000 mcg by mouth daily., Disp: , Rfl:   ROS  Constitutional: Denies any fever or chills Gastrointestinal: No reported hemesis, hematochezia, vomiting, or acute GI distress Musculoskeletal: Denies any acute onset joint swelling, redness, loss of ROM, or weakness Neurological: No reported episodes of acute onset apraxia,  aphasia, dysarthria, agnosia, amnesia, paralysis, loss of coordination, or loss of consciousness  Allergies  Austin [name] is allergic to penicillins.  PFSH  Drug: Austin [name]  reports that he does not use drugs. Alcohol:  reports that he drinks alcohol. Tobacco:  reports that he has quit smoking. His smoking use included Cigarettes. He quit after 15.00 years of use. He has never used smokeless tobacco. Medical:  has a past medical history of  Absolute anemia (12/08/2014); Allergy; Arthritis; Bradycardia (05/03/2015); Calculus of kidney (12/08/2014); Coronary artery disease; GERD (gastroesophageal reflux disease); History of kidney stones; HLD (hyperlipidemia); Hyperglycemia (08/27/2015); Hypertension; Myocardial infarction [facility]) (2011); Peripheral vascular disease ([place]); and VHD (valvular heart disease). Surgical: Austin [name]  has a past surgical history that includes Shoulder surgery (Right); Coronary artery bypass graft (2007); Colonoscopy (N/A, 10/02/2014); Tonsillectomy; Hemorroidectomy; Carpal tunnel release (Bilateral); Lumbar epidural injection (2011); Mouth surgery; Cardiac catheterization (2007); Cardiac catheterization (N/A, 04/17/2015); Knee arthroscopy (Right, 08/05/2016); Knee arthroscopy with medial menisectomy (Right, 08/05/2016); Synovectomy (Right, 08/05/2016); and Knee arthroscopy with lateral release (Right, 08/05/2016). Family: family history includes Heart disease in his brother, brother, brother, brother, father, and mother.  Constitutional Exam  General appearance: Well nourished, well developed, and well hydrated. In no apparent acute distress Vitals:   01/29/17 1445  BP: (!) 141/55  Pulse: 98  Resp: 16  Temp: 98 F (36.7 C)  TempSrc: Oral  SpO2: 98%  Weight: 170 lb (77.1 kg)  Height: '5\' 7"'  (1.702 m)   BMI Assessment: Estimated body mass index is 26.63 kg/m as calculated from the following:   Height as of this encounter: '5\' 7"'  (1.702 m).   Weight as of this encounter: 170 lb (77.1  kg).  BMI interpretation table: BMI level Category Range association with higher incidence of chronic pain  <18 kg/m2 Underweight   18.5-24.9 kg/m2 Ideal body weight   25-29.9 kg/m2 Overweight Increased incidence by 20%  30-34.9 kg/m2 Obese (Class I) Increased incidence by 68%  35-39.9 kg/m2 Severe obesity (Class II) Increased incidence by 136%  >40 kg/m2 Extreme obesity (Class III) Increased incidence by 254%   BMI Readings from Last 4 Encounters:  01/29/17 26.63 kg/m  12/30/16 26.63 kg/m  12/02/16 26.63 kg/m  11/10/16 26.63 kg/m   Wt Readings from Last 4 Encounters:  01/29/17 170 lb (77.1 kg)  12/30/16 170 lb (77.1 kg)  12/02/16 170 lb (77.1 kg)  11/10/16 170 lb (77.1 kg)  Psych/Mental status: Alert, oriented x 3 (person, place, & time)       Eyes: PERLA Respiratory: No evidence of acute respiratory distress  Cervical Spine Area Exam  Skin & Axial Inspection: No masses, redness, edema, swelling, or associated skin lesions Alignment: Symmetrical Functional ROM: Unrestricted ROM      Stability: No instability detected Muscle Tone/Strength: Functionally intact. No obvious neuro-muscular anomalies detected. Sensory (Neurological): Unimpaired Palpation: No palpable anomalies              Upper Extremity (UE) Exam    Side: Right upper extremity  Side: Left upper extremity  Skin & Extremity Inspection: Skin color, temperature, and hair growth are WNL. No peripheral edema or cyanosis. No masses, redness, swelling, asymmetry, or associated skin lesions. No contractures.  Skin & Extremity Inspection: Skin color, temperature, and hair growth are WNL. No peripheral edema or cyanosis. No masses, redness, swelling, asymmetry, or associated skin lesions. No contractures.  Functional ROM: Unrestricted ROM          Functional ROM: Unrestricted ROM          Muscle Tone/Strength: Functionally intact. No obvious neuro-muscular anomalies detected.  Muscle Tone/Strength: Functionally intact.  No obvious neuro-muscular anomalies detected.  Sensory (Neurological): Unimpaired          Sensory (Neurological): Unimpaired          Palpation: No palpable anomalies              Palpation: No palpable anomalies  Specialized Test(s): Deferred         Specialized Test(s): Deferred          Thoracic Spine Area Exam  Skin & Axial Inspection: No masses, redness, or swelling Alignment: Symmetrical Functional ROM: Unrestricted ROM Stability: No instability detected Muscle Tone/Strength: Functionally intact. No obvious neuro-muscular anomalies detected. Sensory (Neurological): Unimpaired Muscle strength & Tone: No palpable anomalies  Lumbar Spine Area Exam  Skin & Axial Inspection: No masses, redness, or swelling Alignment: Symmetrical Functional ROM: Improved after treatment      Stability: No instability detected Muscle Tone/Strength: Functionally intact. No obvious neuro-muscular anomalies detected. Sensory (Neurological): Unimpaired Palpation: No palpable anomalies       Provocative Tests: Lumbar Hyperextension and rotation test: evaluation deferred today       Lumbar Lateral bending test: evaluation deferred today       Patrick's Maneuver: evaluation deferred today                    Gait & Posture Assessment  Ambulation: Unassisted & improved. The patient is still limping but this time is secondary to his knee pathology. He is pending to have knee surgery in the next couple weeks. Gait: Antalgic. His limping due to his knee. Posture: WNL   Lower Extremity Exam    Side: Right lower extremity  Side: Left lower extremity  Skin & Extremity Inspection: Skin color, temperature, and hair growth are WNL. No peripheral edema or cyanosis. No masses, redness, swelling, asymmetry, or associated skin lesions. No contractures.  Skin & Extremity Inspection: Skin color, temperature, and hair growth are WNL. No peripheral edema or cyanosis. No masses, redness, swelling, asymmetry, or  associated skin lesions. No contractures.  Functional ROM: Unrestricted ROM          Functional ROM: Limited ROM for knee joint  Muscle Tone/Strength: Functionally intact. No obvious neuro-muscular anomalies detected.  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 Diagnoses of Lumbar foraminal stenosis (Right L2-3; Bilateral L3-4, L4-5), Chronic low back pain (Location of Primary Source of Pain) (Bilateral) (R>L), Chronic Lumbar radiculitis (intermittent/recurrent) (Bilateral), and Grade 1 Anterolisthesis of L3 over L4 were also pertinent to this visit.  Status Diagnosis  Persistent Persistent Improved 1. Lumbar central spinal stenosis (severe at L4-5)   2. Lumbar foraminal stenosis (Right L2-3; Bilateral L3-4, L4-5)   3. Chronic low back pain (Location of Primary Source of Pain) (Bilateral) (R>L)   4. Chronic Lumbar radiculitis (intermittent/recurrent) (Bilateral)   5. Grade 1 Anterolisthesis of L3 over L4     Problems updated and reviewed during this visit: Problem  Chronic Lumbar radiculitis (intermittent/recurrent) (Bilateral)  Lumbar central spinal stenosis (Severe at L4-5)  Easy Bruising   Plan of Care  Pharmacotherapy (Medications Ordered): No orders of the defined types were placed in this encounter.  New Prescriptions   No medications on file   Medications administered today: Austin [name] had no medications administered during this visit.   Procedure Orders     Lumbar Epidural Injection     Lumbar Transforaminal Epidural Lab Orders  No laboratory test(s) ordered today   Imaging Orders  No imaging studies ordered today   Referral Orders  No referral(s) requested today    Interventional  management options: Planned, scheduled, and/or pending:   Not  at this time.   Considering:   Palliative right-sided L4-5 lumbar epidural steroid injection   Palliative bilateral lumbar facet block  #3.  Diagnostic right-sided L2-3 transforaminal epidural steroid injection + bilateral L3-4 and/or L4-5 transforaminal epidural steroid injection     Palliative PRN treatment(s):   Palliative right-sided L3-4 lumbar epidural steroid injection   Palliative right-sided L2-3 transforaminal epidural steroid injection + bilateral L3-4   Provider-requested follow-up: Return if symptoms worsen or fail to improve.  Future Appointments Date Time Provider Ray  02/09/2017 10:45 AM Jerrol Banana., MD [facility] None  03/05/2017 9:00 AM [facility] HEALTH ADVISOR [facility] None  03/05/2017 9:30 AM Jerrol Banana., MD [facility] None  11/06/2017 8:30 AM [facility] VASC 2 [facility] None  11/06/2017 9:30 AM Dew, Erskine Squibb, MD [facility] None   Primary Care Physician: Jerrol Banana., MD Location: [facility] Note by: Gaspar Cola, MD Date: 01/29/2017; Time: 5:12 PM

## 2017-01-30 DIAGNOSIS — Z01818 Encounter for other preprocedural examination: Secondary | ICD-10-CM | POA: Diagnosis not present

## 2017-01-30 DIAGNOSIS — Z01812 Encounter for preprocedural laboratory examination: Secondary | ICD-10-CM | POA: Diagnosis not present

## 2017-01-30 DIAGNOSIS — M1711 Unilateral primary osteoarthritis, right knee: Secondary | ICD-10-CM | POA: Diagnosis not present

## 2017-01-30 DIAGNOSIS — I10 Essential (primary) hypertension: Secondary | ICD-10-CM | POA: Diagnosis not present

## 2017-02-09 ENCOUNTER — Encounter: Payer: Self-pay | Admitting: Family Medicine

## 2017-02-09 ENCOUNTER — Ambulatory Visit (INDEPENDENT_AMBULATORY_CARE_PROVIDER_SITE_OTHER): Payer: Medicare Other | Admitting: Family Medicine

## 2017-02-09 VITALS — BP 140/60 | HR 58 | Temp 97.5°F | Resp 16 | Wt 174.0 lb

## 2017-02-09 DIAGNOSIS — M25561 Pain in right knee: Secondary | ICD-10-CM | POA: Diagnosis not present

## 2017-02-09 DIAGNOSIS — Z01818 Encounter for other preprocedural examination: Secondary | ICD-10-CM | POA: Diagnosis not present

## 2017-02-09 DIAGNOSIS — G8929 Other chronic pain: Secondary | ICD-10-CM | POA: Diagnosis not present

## 2017-02-09 NOTE — Progress Notes (Signed)
Patient: Austin [name] Male    DOB: 17-Aug-1928   83 y.o.   MRN: [id number] Visit Date: 02/09/2017  Today's Provider: Wilhemena Durie, MD   Chief Complaint  Patient presents with  . Medical Clearance   Subjective:    HPI   Pt is here for a surgical clearance to have a partial knee replacement of the right knee by Dr. Kurtis Bushman. He is scheduled for 03/06/17. Pt denies any chest pain, SOB or history of anaesthesia complications.  Pt reports that he had a pre op visit with a specialty hospital in [place] where they did an EKG, and labs but we are not able to acces these records through epic.      Allergies  Allergen Reactions  . Penicillins Itching, Swelling and Rash    Has patient had a PCN reaction causing immediate rash, facial/tongue/throat swelling, SOB or lightheadedness with hypotension: Yes Has patient had a PCN reaction causing severe rash involving mucus membranes or skin necrosis: No Has patient had a PCN reaction that required hospitalization No Has patient had a PCN reaction occurring within the last 10 years: No If all of the above answers are "NO", then may proceed with Cephalosporin use.      Current Outpatient Prescriptions:  .  amLODipine (NORVASC) 5 MG tablet, Take 5 mg by mouth daily., Disp: , Rfl:  .  aspirin EC 81 MG tablet, Take 81 mg by mouth daily., Disp: , Rfl:  .  atorvastatin (LIPITOR) 20 MG tablet, Take 20 mg by mouth every other day. , Disp: , Rfl:  .  Biotin 5000 MCG CAPS, Take 5,000 mcg by mouth daily. , Disp: , Rfl:  .  Cholecalciferol (VITAMIN D3) 2000 UNITS capsule, Take 2,000 Units by mouth 2 (two) times daily. , Disp: , Rfl:  .  COENZYME Q-10 PO, Take 200 mg by mouth daily. , Disp: , Rfl:  .  dorzolamide-timolol (COSOPT) 22.3-6.8 MG/ML ophthalmic solution, Place 1 drop into both eyes 2 (two) times daily. , Disp: , Rfl:  .  glucosamine-chondroitin 500-400 MG tablet, Take 1 tablet by mouth 2 (two) times daily., Disp: , Rfl:  .   isosorbide mononitrate (IMDUR) 60 MG 24 hr tablet, Take 60 mg by mouth daily. , Disp: , Rfl:  .  naproxen sodium (ANAPROX) 220 MG tablet, Take 220-440 mg by mouth daily as needed (pain)., Disp: , Rfl:  .  nitroGLYCERIN (NITROSTAT) 0.4 MG SL tablet, Place 0.4 mg under the tongue every 5 (five) minutes as needed for chest pain. , Disp: , Rfl:  .  OMEGA-3 FATTY ACIDS PO, Take 1,200 mcg by mouth 2 (two) times daily. , Disp: , Rfl:  .  omeprazole (PRILOSEC) 20 MG capsule, Take 20 mg by mouth daily. Reported on 08/21/2015, Disp: , Rfl:  .  ondansetron (ZOFRAN) 4 MG tablet, Take 1 tablet (4 mg total) by mouth every 8 (eight) hours as needed for nausea or vomiting., Disp: 30 tablet, Rfl: 0 .  Polyethyl Glycol-Propyl Glycol (SYSTANE OP), Apply 1 drop to eye daily as needed (Dry eyes)., Disp: , Rfl:  .  tamsulosin (FLOMAX) 0.4 MG CAPS capsule, TAKE 1 CAPSULE BY MOUTH EVERY DAY, Disp: 30 capsule, Rfl: 11 .  Travoprost, BAK Free, (TRAVATAN Z) 0.004 % SOLN ophthalmic solution, Place 1 drop into both eyes at bedtime. , Disp: , Rfl:  .  valsartan-hydrochlorothiazide (DIOVAN HCT) 320-25 MG per tablet, Take 1 tablet by mouth daily. , Disp: , Rfl:  .  vitamin B-12 (CYANOCOBALAMIN) 1000 MCG tablet, Take 1,000 mcg by mouth daily., Disp: , Rfl:   Review of Systems  Constitutional: Negative.   HENT: Negative.   Eyes: Negative.   Respiratory: Negative.   Cardiovascular: Negative.   Gastrointestinal: Negative.   Endocrine: Negative.   Genitourinary: Negative.   Musculoskeletal: Positive for arthralgias.  Skin: Negative.   Allergic/Immunologic: Negative.   Neurological: Negative.   Hematological: Negative.   Psychiatric/Behavioral: Negative.     Social History  Substance Use Topics  . Smoking status: Former Smoker    Years: 15.00    Types: Cigarettes  . Smokeless tobacco: Never Used  . Alcohol use Yes     Comment: 1-2 glasses of wine a night   Objective:   BP 140/60 (BP Location: Left Arm, Patient  Position: Sitting, Cuff Size: Normal)   Pulse (!) 58   Temp (!) 97.5 F (36.4 C) (Oral)   Resp 16   Wt 174 lb (78.9 kg)   SpO2 98%   BMI 27.25 kg/m  Vitals:   02/09/17 1114  BP: 140/60  Pulse: (!) 58  Resp: 16  Temp: (!) 97.5 F (36.4 C)  TempSrc: Oral  SpO2: 98%  Weight: 174 lb (78.9 kg)     Physical Exam  Constitutional: He is oriented to person, place, and time. He appears well-developed and well-nourished.  HENT:  Head: Normocephalic and atraumatic.  Eyes: Pupils are equal, round, and reactive to light. Conjunctivae and EOM are normal. No scleral icterus.  Neck: Normal range of motion. Neck supple. No thyromegaly present.  Cardiovascular: Normal rate, regular rhythm, normal heart sounds and intact distal pulses.   No carotid bruit.  Pulmonary/Chest: Effort normal and breath sounds normal.  Abdominal: Soft.  Musculoskeletal: Normal range of motion.  Neurological: He is alert and oriented to person, place, and time. He has normal reflexes.  Skin: Skin is warm and dry.  Psychiatric: He has a normal mood and affect. His behavior is normal. Judgment and thought content normal.        Assessment & Plan:     1. Pre-operative clearance Cleared for surgery. Per pt, he had labs done at the speciality hospital. So will not get labs today. Low to moderate risk due to known CAD. Presently stable. - EKG 12-Lead 2.OA 3.CAD     HPI, Exam, and A&P Transcribed under the direction and in the presence of Dave Mannes L. Cranford Mon, MD  Electronically Signed: Katina Dung, CMA I have done the exam and reviewed the chart and it is accurate to the best of my knowledge. Development worker, community has been used and  any errors in dictation or transcription are unintentional. Miguel Aschoff M.D. [place], MD  [facility]

## 2017-02-11 DIAGNOSIS — M1711 Unilateral primary osteoarthritis, right knee: Secondary | ICD-10-CM | POA: Diagnosis not present

## 2017-03-02 HISTORY — PX: JOINT REPLACEMENT: SHX530

## 2017-03-05 ENCOUNTER — Ambulatory Visit (INDEPENDENT_AMBULATORY_CARE_PROVIDER_SITE_OTHER): Payer: Medicare Other | Admitting: Family Medicine

## 2017-03-05 ENCOUNTER — Ambulatory Visit (INDEPENDENT_AMBULATORY_CARE_PROVIDER_SITE_OTHER): Payer: Medicare Other

## 2017-03-05 VITALS — BP 142/60 | HR 72 | Temp 98.1°F | Resp 16 | Ht 67.0 in | Wt 174.0 lb

## 2017-03-05 DIAGNOSIS — E785 Hyperlipidemia, unspecified: Secondary | ICD-10-CM | POA: Diagnosis not present

## 2017-03-05 DIAGNOSIS — I10 Essential (primary) hypertension: Secondary | ICD-10-CM

## 2017-03-05 DIAGNOSIS — I25810 Atherosclerosis of coronary artery bypass graft(s) without angina pectoris: Secondary | ICD-10-CM | POA: Diagnosis not present

## 2017-03-05 DIAGNOSIS — M170 Bilateral primary osteoarthritis of knee: Secondary | ICD-10-CM

## 2017-03-05 DIAGNOSIS — Z0000 Encounter for general adult medical examination without abnormal findings: Secondary | ICD-10-CM

## 2017-03-05 LAB — COMPREHENSIVE METABOLIC PANEL
AG Ratio: 1.6 (calc) (ref 1.0–2.5)
ALBUMIN MSPROF: 4.4 g/dL (ref 3.6–5.1)
ALKALINE PHOSPHATASE (APISO): 66 U/L (ref 40–115)
ALT: 17 U/L (ref 9–46)
AST: 18 U/L (ref 10–35)
BILIRUBIN TOTAL: 1 mg/dL (ref 0.2–1.2)
BUN/Creatinine Ratio: 11 (calc) (ref 6–22)
BUN: 17 mg/dL (ref 7–25)
CHLORIDE: 102 mmol/L (ref 98–110)
CO2: 27 mmol/L (ref 20–32)
CREATININE: 1.5 mg/dL — AB (ref 0.70–1.11)
Calcium: 9.4 mg/dL (ref 8.6–10.3)
GLOBULIN: 2.7 g/dL (ref 1.9–3.7)
Glucose, Bld: 120 mg/dL — ABNORMAL HIGH (ref 65–99)
POTASSIUM: 4.1 mmol/L (ref 3.5–5.3)
SODIUM: 137 mmol/L (ref 135–146)
TOTAL PROTEIN: 7.1 g/dL (ref 6.1–8.1)

## 2017-03-05 LAB — CBC WITH DIFFERENTIAL/PLATELET
BASOS PCT: 0.8 %
Basophils Absolute: 42 cells/uL (ref 0–200)
EOS PCT: 2.3 %
Eosinophils Absolute: 120 cells/uL (ref 15–500)
HEMATOCRIT: 41.4 % (ref 38.5–50.0)
Hemoglobin: 14.1 g/dL (ref 13.2–17.1)
LYMPHS ABS: 1170 {cells}/uL (ref 850–3900)
MCH: 30.8 pg (ref 27.0–33.0)
MCHC: 34.1 g/dL (ref 32.0–36.0)
MCV: 90.4 fL (ref 80.0–100.0)
MPV: 10.5 fL (ref 7.5–12.5)
Monocytes Relative: 8.3 %
Neutro Abs: 3437 cells/uL (ref 1500–7800)
Neutrophils Relative %: 66.1 %
PLATELETS: 203 10*3/uL (ref 140–400)
RBC: 4.58 10*6/uL (ref 4.20–5.80)
RDW: 12.5 % (ref 11.0–15.0)
Total Lymphocyte: 22.5 %
WBC: 5.2 10*3/uL (ref 3.8–10.8)
WBCMIX: 432 {cells}/uL (ref 200–950)

## 2017-03-05 LAB — LIPID PANEL
Cholesterol: 167 mg/dL (ref <200)
HDL: 46 mg/dL (ref >40)
LDL CHOLESTEROL (CALC): 90 mg/dL
NON-HDL CHOLESTEROL (CALC): 121 mg/dL (ref <130)
Total CHOL/HDL Ratio: 3.6 (calc) (ref <5.0)
Triglycerides: 216 mg/dL — ABNORMAL HIGH (ref <150)

## 2017-03-05 LAB — TSH: TSH: 1.75 m[IU]/L (ref 0.40–4.50)

## 2017-03-05 NOTE — Progress Notes (Signed)
Patient: Austin [name], Male    DOB: 09-Jan-1929, 83 y.o.   MRN: [id number] Visit Date: 03/05/2017  Today's Provider: Wilhemena Durie, MD   Chief Complaint  Patient presents with  . Annual Exam   Subjective:   Austin [name] is a 83 y.o. male who presents today for his Subsequent Annual Wellness Visit. He feels well. He reports exercising none. He reports he is sleeping well.  Immunization History  Administered Date(s) Administered  . Influenza, High Dose Seasonal PF 02/27/2016  . Influenza-Unspecified 03/03/2015  . Pneumococcal Conjugate-13 12/14/2013  . Pneumococcal Polysaccharide-23 04/04/1998, 03/25/2006  . Td 05/01/2008  . Tdap 02/25/2011  . Zoster 10/07/2011   10/02/14 Colonoscopy, Dr Elliott-focal lymphoid aggregate  Review of Systems  Constitutional: Negative.   HENT: Negative.   Eyes: Negative.   Respiratory: Negative.   Cardiovascular: Positive for leg swelling.  Gastrointestinal: Negative.   Endocrine: Negative.   Genitourinary: Negative.   Musculoskeletal: Positive for arthralgias, back pain and joint swelling.  Skin: Negative.   Allergic/Immunologic: Negative.   Neurological: Negative.   Hematological: Negative.   Psychiatric/Behavioral: Negative.     Patient Active Problem List   Diagnosis Date Noted  . Chronic Lumbar radiculitis (intermittent/recurrent) (Bilateral) 01/29/2017  . Easy bruising 01/14/2017  . Lumbar central spinal stenosis (Severe at L4-5) 12/30/2016  . Chronic pain syndrome 12/02/2016  . Full thickness rotator cuff tear 11/10/2016  . Stiffness of shoulder joint 11/10/2016  . Weakness of limb 11/10/2016  . Osteoarthritis of knee (Right) 11/10/2016  . Carotid stenosis 11/07/2016  . Chronic knee pain (Right) 07/30/2016  . Tear of medial meniscus of knee 07/16/2016  . Peripheral vascular disease ([place]) 08/14/2015  . Lumbar facet hypertrophy (multilevel) 08/14/2015  . Grade 1 Anterolisthesis of L3 over L4 08/14/2015  . Lumbar  foraminal stenosis (Right L2-3; Bilateral L3-4, L4-5) 08/14/2015  . Chronic low back pain (Location of Primary Source of Pain) (Bilateral) (R>L) 08/13/2015  . Lumbar spondylosis 08/13/2015  . Lumbar facet syndrome (Bilateral) (R>L) 08/13/2015  . Coronary artery disease involving native coronary artery of native heart with unstable angina pectoris ([place]) 01/08/2015  . Benign fibroma of prostate 12/08/2014  . Atherosclerosis of coronary artery 12/08/2014  . Cataract 12/08/2014  . Esophageal stenosis 12/08/2014  . Acid reflux 12/08/2014  . Glaucoma 12/08/2014  . Hemorrhoid 12/08/2014  . HLD (hyperlipidemia) 12/08/2014  . Obstructive apnea 12/08/2014  . Borderline diabetes 12/08/2014  . Esophagogastric ring 12/08/2014  . Heart valve disease 12/08/2014  . MI (mitral incompetence) 10/03/2014  . Cardiomyopathy, ischemic 10/03/2014  . History of colonic polyps 10/02/2014  . Benign essential HTN 09/20/2014    Social History   Social History  . Marital status: Married    Spouse name: Austin [name]  . Number of children: 1  . Years of education: N/A   Occupational History  .  Retired   Social History Main Topics  . Smoking status: Former Smoker    Years: 15.00    Types: Cigarettes  . Smokeless tobacco: Never Used  . Alcohol use Yes     Comment: 1-2 glasses of wine a night  . Drug use: No  . Sexual activity: No   Other Topics Concern  . Not on file   Social History Narrative  . No narrative on file    Past Surgical History:  Procedure Laterality Date  . CARDIAC CATHETERIZATION  2007  . CARDIAC CATHETERIZATION N/A 04/17/2015   Procedure: Left Heart Cath and Coronary Angiography;  Surgeon: Corey Skains,  MD;  Location: [facility];  Service: Cardiovascular;  Laterality: N/A;  . CARPAL TUNNEL RELEASE Bilateral    right 06/09/08 left 05/03/09  . COLONOSCOPY N/A 10/02/2014   Procedure: COLONOSCOPY;  Surgeon: Manya Silvas, MD;  Location: [facility];  Service: Endoscopy;   Laterality: N/A;  . CORONARY ARTERY BYPASS GRAFT  2007  . HEMORROIDECTOMY    . KNEE ARTHROSCOPY Right 08/05/2016   Procedure: ARTHROSCOPY KNEE;  Surgeon: Thornton Park, MD;  Location: [facility];  Service: Orthopedics;  Laterality: Right;  . KNEE ARTHROSCOPY WITH LATERAL RELEASE Right 08/05/2016   Procedure: KNEE ARTHROSCOPY WITH LATERAL RELEASE;  Surgeon: Thornton Park, MD;  Location: [facility];  Service: Orthopedics;  Laterality: Right;  . KNEE ARTHROSCOPY WITH MEDIAL MENISECTOMY Right 08/05/2016   Procedure: KNEE ARTHROSCOPY WITH MEDIAL MENISECTOMY;  Surgeon: Thornton Park, MD;  Location: [facility];  Service: Orthopedics;  Laterality: Right;  Partial  . LUMBAR EPIDURAL INJECTION  2011  . MOUTH SURGERY    . SHOULDER SURGERY Right   . SYNOVECTOMY Right 08/05/2016   Procedure: SYNOVECTOMY;  Surgeon: Thornton Park, MD;  Location: [facility] OR[facility];  Service: Orthopedics;  Laterality: Right;  arthroscopic extensive synovectomy  . TONSILLECTOMY      His family history includes Heart disease in his brother, brother, brother, brother, brother, father, and mother.     Outpatient Encounter Prescriptions as of 03/05/2017  Medication Sig  . amLODipine (NORVASC) 5 MG tablet Take 5 mg by mouth daily.  Marland Kitchen aspirin EC 81 MG tablet Take 81 mg by mouth daily.  Marland Kitchen atorvastatin (LIPITOR) 20 MG tablet Take 20 mg by mouth every other day.   . Biotin 5000 MCG CAPS Take 5,000 mcg by mouth daily.   . Cholecalciferol (VITAMIN D3) 2000 UNITS capsule Take 2,000 Units by mouth 2 (two) times daily.   Marland Kitchen COENZYME Q-10 PO Take 200 mg by mouth daily.   . dorzolamide-timolol (COSOPT) 22.3-6.8 MG/ML ophthalmic solution Place 1 drop into both eyes 2 (two) times daily.   Marland Kitchen glucosamine-chondroitin 500-400 MG tablet Take 1 tablet by mouth 2 (two) times daily.  . isosorbide mononitrate (IMDUR) 60 MG 24 hr tablet Take 60 mg by mouth daily.   . naproxen sodium (ANAPROX) 220 MG tablet Take 220-440 mg by mouth daily as needed (pain).  .  nitroGLYCERIN (NITROSTAT) 0.4 MG SL tablet Place 0.4 mg under the tongue every 5 (five) minutes as needed for chest pain.   Marland Kitchen OMEGA-3 FATTY ACIDS PO Take 1,200 mcg by mouth 2 (two) times daily.   Marland Kitchen omeprazole (PRILOSEC) 20 MG capsule Take 20 mg by mouth daily. Reported on 08/21/2015  . Polyethyl Glycol-Propyl Glycol (SYSTANE OP) Apply 1 drop to eye daily as needed (Dry eyes).  . tamsulosin (FLOMAX) 0.4 MG CAPS capsule TAKE 1 CAPSULE BY MOUTH EVERY DAY  . Travoprost, BAK Free, (TRAVATAN Z) 0.004 % SOLN ophthalmic solution Place 1 drop into both eyes at bedtime.   . valsartan-hydrochlorothiazide (DIOVAN HCT) 320-25 MG per tablet Take 1 tablet by mouth daily.   . vitamin B-12 (CYANOCOBALAMIN) 1000 MCG tablet Take 1,000 mcg by mouth daily.   No facility-administered encounter medications on file as of 03/05/2017.     Allergies  Allergen Reactions  . Penicillins Itching, Swelling and Rash    Has patient had a PCN reaction causing immediate rash, facial/tongue/throat swelling, SOB or lightheadedness with hypotension: Yes Has patient had a PCN reaction causing severe rash involving mucus membranes or skin necrosis: No Has patient had a  PCN reaction that required hospitalization No Has patient had a PCN reaction occurring within the last 10 years: No If all of the above answers are "NO", then may proceed with Cephalosporin use.     Patient Care Team: Jerrol Banana., MD as PCP - General (Family Medicine) Lovell Sheehan, MD as Consulting Physician (Orthopedic Surgery)   Objective:   Vitals: There were no vitals filed for this visit.  Wt Readings from Last 3 Encounters:  03/05/17 174 lb (78.9 kg)  02/09/17 174 lb (78.9 kg)  01/29/17 170 lb (77.1 kg)   Temp Readings from Last 3 Encounters:  03/05/17 98.1 F (36.7 C)  02/09/17 (!) 97.5 F (36.4 C) (Oral)  01/29/17 98 F (36.7 C) (Oral)   BP Readings from Last 3 Encounters:  03/05/17 (!) 142/60  02/09/17 140/60  01/29/17 (!)  141/55   Pulse Readings from Last 3 Encounters:  03/05/17 72  02/09/17 (!) 58  01/29/17 98    Physical Exam  Constitutional: He is oriented to person, place, and time. He appears well-developed and well-nourished.  HENT:  Head: Normocephalic and atraumatic.  Right Ear: External ear normal.  Left Ear: External ear normal.  Nose: Nose normal.  Mouth/Throat: Oropharynx is clear and moist.  Eyes: Pupils are equal, round, and reactive to light. Conjunctivae and EOM are normal.  Neck: Normal range of motion. Neck supple.  Cardiovascular: Normal rate, regular rhythm, normal heart sounds and intact distal pulses.   Pulmonary/Chest: Effort normal and breath sounds normal.  Abdominal: Soft. Bowel sounds are normal.  Genitourinary: Penis normal.  Genitourinary Comments: Pt declines DRE.  Musculoskeletal: Normal range of motion.  Neurological: He is alert and oriented to person, place, and time.  Skin: Skin is warm and dry.  Psychiatric: He has a normal mood and affect. His behavior is normal. Judgment and thought content normal.    Activities of Daily Living In your present state of health, do you have any difficulty performing the following activities: 03/05/2017 07/28/2016  Hearing? N N  Vision? N N  Difficulty concentrating or making decisions? N N  Walking or climbing stairs? Y N  Comment occassional pain in R knee -  Dressing or bathing? N N  Doing errands, shopping? N N  Preparing Food and eating ? N -  Using the Toilet? N -  In the past six months, have you accidently leaked urine? N -  Do you have problems with loss of bowel control? N -  Managing your Medications? N -  Managing your Finances? N -  Housekeeping or managing your Housekeeping? N -  Some recent data might be hidden    Fall Risk Assessment Fall Risk  03/05/2017 01/29/2017 12/30/2016 12/02/2016 11/10/2016  Falls in the past year? No No No No No  Risk for fall due to : Impaired balance/gait - - - -  Risk for fall  due to: Comment occassional pain in R knee - - - -     Depression Screen PHQ 2/9 Scores 03/05/2017 01/29/2017 12/30/2016 12/02/2016  PHQ - 2 Score 0 0 0 0  Exception Documentation - - - -    6CIT Screen 03/05/2017  What Year? 0 points  What month? 0 points  What time? 0 points  Count back from 20 0 points  Months in reverse 0 points  Repeat phrase 0 points  Total Score 0     Assessment & Plan:     Annual Wellness Visit  Reviewed patient's Family Medical  History Reviewed and updated list of patient's medical providers Assessment of cognitive impairment was done Assessed patient's functional ability Established a written schedule for health screening [place] Completed and Reviewed  Exercise Activities and Dietary recommendations Goals    . Reduce portion size          Recommend to continue to monitor portion sizes and to decrease sodium intake.       Immunization History  Administered Date(s) Administered  . Influenza, High Dose Seasonal PF 02/27/2016  . Influenza-Unspecified 03/03/2015  . Pneumococcal Conjugate-13 12/14/2013  . Pneumococcal Polysaccharide-23 04/04/1998, 03/25/2006  . Td 05/01/2008  . Tdap 02/25/2011  . Zoster 10/07/2011    Health Maintenance  Topic Date Due  . INFLUENZA VACCINE  05/04/2017 (Originally 12/31/2016)  . TETANUS/TDAP  02/24/2021  . PNA vac Low Risk Adult  Completed     Discussed health benefits of physical activity, and encouraged him to engage in regular exercise appropriate for his age and condition.  CAD HTN HLD OA Check labs.  I have done the exam and reviewed the chart and it is accurate to the best of my knowledge. Development worker, community has been used and  any errors in dictation or transcription are unintentional. Miguel Aschoff M.D. [facility]

## 2017-03-05 NOTE — Patient Instructions (Signed)
Austin [name] , Thank you for taking time to come for your Medicare Wellness Visit. I appreciate your ongoing commitment to your health goals. Please review the following plan we discussed and let me know if I can assist you in the future.   Screening recommendations/referrals: Colonoscopy: completed Recommended yearly ophthalmology/optometry visit for glaucoma screening and checkup Recommended yearly dental visit for hygiene and checkup  Vaccinations: Influenza vaccine: declined. Pt states he was advised by his orthopedic surgeon to complete flu vaccine 6 weeks after knee surgery  Pneumococcal vaccine: completed series Tdap vaccine: up to date Shingles vaccine: up to date   Advanced directives: Please bring a copy of your POA (Power of [name]) and/or Living Will to your next appointment.   Conditions/risks identified: Recommend to continue to monitor portion sizes and to decrease sodium intake; Fall risk prevention discussed  Next appointment: Follow up annual wellness exam in one year.  Preventive Care 81 Years and Older, Male Preventive care refers to lifestyle choices and visits with your health care provider that can promote health and wellness. What does preventive care include?  A yearly physical exam. This is also called an annual well check.  Dental exams once or twice a year.  Routine eye exams. Ask your health care provider how often you should have your eyes checked.  Personal lifestyle choices, including:  Daily care of your teeth and gums.  Regular physical activity.  Eating a healthy diet.  Avoiding tobacco and drug use.  Limiting alcohol use.  Practicing safe sex.  Taking low doses of aspirin every day.  Taking vitamin and mineral supplements as recommended by your health care provider. What happens during an annual well check? The services and screenings done by your health care provider during your annual well check will depend on your age, overall  health, lifestyle risk factors, and family history of disease. Counseling  Your health care provider may ask you questions about your:  Alcohol use.  Tobacco use.  Drug use.  Emotional well-being.  Home and relationship well-being.  Sexual activity.  Eating habits.  History of falls.  Memory and ability to understand (cognition).  Work and work Statistician. Screening  You may have the following tests or measurements:  Height, weight, and BMI.  Blood pressure.  Lipid and cholesterol levels. These may be checked every 5 years, or more frequently if you are over 26 years old.  Skin check.  Lung cancer screening. You may have this screening every year starting at age 75 if you have a 30-pack-year history of smoking and currently smoke or have quit within the past 15 years.  Fecal occult blood test (FOBT) of the stool. You may have this test every year starting at age 12.  Flexible sigmoidoscopy or colonoscopy. You may have a sigmoidoscopy every 5 years or a colonoscopy every 10 years starting at age 59.  Prostate cancer screening. Recommendations will vary depending on your family history and other risks.  Hepatitis C blood test.  Hepatitis B blood test.  Sexually transmitted disease (STD) testing.  Diabetes screening. This is done by checking your blood sugar (glucose) after you have not eaten for a while (fasting). You may have this done every 1-3 years.  Abdominal aortic aneurysm (AAA) screening. You may need this if you are a current or former smoker.  Osteoporosis. You may be screened starting at age 77 if you are at high risk. Talk with your health care provider about your test results, treatment options, and if  necessary, the need for more tests. Vaccines  Your health care provider may recommend certain vaccines, such as:  Influenza vaccine. This is recommended every year.  Tetanus, diphtheria, and acellular pertussis (Tdap, Td) vaccine. You may need a Td  booster every 10 years.  Zoster vaccine. You may need this after age 80.  Pneumococcal 13-valent conjugate (PCV13) vaccine. One dose is recommended after age 16.  Pneumococcal polysaccharide (PPSV23) vaccine. One dose is recommended after age 46. Talk to your health care provider about which screenings and vaccines you need and how often you need them. This information is not intended to replace advice given to you by your health care provider. Make sure you discuss any questions you have with your health care provider. Document Released: 06/15/2015 Document Revised: 02/06/2016 Document Reviewed: 03/20/2015 Elsevier Interactive Patient Education  2017 [name] Prevention in the Home Falls can cause injuries. They can happen to people of all ages. There are many things you can do to make your home safe and to help prevent falls. What can I do on the outside of my home?  Regularly fix the edges of walkways and driveways and fix any cracks.  Remove anything that might make you trip as you walk through a door, such as a raised step or threshold.  Trim any bushes or trees on the path to your home.  Use bright outdoor lighting.  Clear any walking paths of anything that might make someone trip, such as rocks or tools.  Regularly check to see if handrails are loose or broken. Make sure that both sides of any steps have handrails.  Any raised decks and porches should have guardrails on the edges.  Have any leaves, snow, or ice cleared regularly.  Use sand or salt on walking paths during winter.  Clean up any spills in your garage right away. This includes oil or grease spills. What can I do in the bathroom?  Use night lights.  Install grab bars by the toilet and in the tub and shower. Do not use towel bars as grab bars.  Use non-skid mats or decals in the tub or shower.  If you need to sit down in the shower, use a plastic, non-slip stool.  Keep the floor dry. Clean up  any water that spills on the floor as soon as it happens.  Remove soap buildup in the tub or shower regularly.  Attach bath mats securely with double-sided non-slip rug tape.  Do not have throw rugs and other things on the floor that can make you trip. What can I do in the bedroom?  Use night lights.  Make sure that you have a light by your bed that is easy to reach.  Do not use any sheets or blankets that are too big for your bed. They should not hang down onto the floor.  Have a firm chair that has side arms. You can use this for support while you get dressed.  Do not have throw rugs and other things on the floor that can make you trip. What can I do in the kitchen?  Clean up any spills right away.  Avoid walking on wet floors.  Keep items that you use a lot in easy-to-reach places.  If you need to reach something above you, use a strong step stool that has a grab bar.  Keep electrical cords out of the way.  Do not use floor polish or wax that makes floors slippery. If you must  use wax, use non-skid floor wax.  Do not have throw rugs and other things on the floor that can make you trip. What can I do with my stairs?  Do not leave any items on the stairs.  Make sure that there are handrails on both sides of the stairs and use them. Fix handrails that are broken or loose. Make sure that handrails are as long as the stairways.  Check any carpeting to make sure that it is firmly attached to the stairs. Fix any carpet that is loose or worn.  Avoid having throw rugs at the top or bottom of the stairs. If you do have throw rugs, attach them to the floor with carpet tape.  Make sure that you have a light switch at the top of the stairs and the bottom of the stairs. If you do not have them, ask someone to add them for you. What else can I do to help prevent falls?  Wear shoes that:  Do not have high heels.  Have rubber bottoms.  Are comfortable and fit you well.  Are  closed at the toe. Do not wear sandals.  If you use a stepladder:  Make sure that it is fully opened. Do not climb a closed stepladder.  Make sure that both sides of the stepladder are locked into place.  Ask someone to hold it for you, if possible.  Clearly mark and make sure that you can see:  Any grab bars or handrails.  First and last steps.  Where the edge of each step is.  Use tools that help you move around (mobility aids) if they are needed. These include:  Canes.  Walkers.  Scooters.  Crutches.  Turn on the lights when you go into a dark area. Replace any light bulbs as soon as they burn out.  Set up your furniture so you have a clear path. Avoid moving your furniture around.  If any of your floors are uneven, fix them.  If there are any pets around you, be aware of where they are.  Review your medicines with your doctor. Some medicines can make you feel dizzy. This can increase your chance of falling. Ask your doctor what other things that you can do to help prevent falls. This information is not intended to replace advice given to you by your health care provider. Make sure you discuss any questions you have with your health care provider. Document Released: 03/15/2009 Document Revised: 10/25/2015 Document Reviewed: 06/23/2014 Elsevier Interactive Patient Education  2017 [employer].

## 2017-03-05 NOTE — Progress Notes (Signed)
Subjective:   Austin [name] is a 83 y.o. male who presents for Medicare Annual/Subsequent preventive examination.  Review of Systems:  N/A Cardiac Risk Factors include: advanced age (>12men, >19 women);dyslipidemia;male gender;hypertension     Objective:    Vitals: BP (!) 142/60 (BP Location: Right Arm, Patient Position: Sitting, Cuff Size: Normal)   Pulse 72   Temp 98.1 F (36.7 C)   Resp 16   Ht 5\' 7"  (1.702 m)   Wt 174 lb (78.9 kg)   BMI 27.25 kg/m   Body mass index is 27.25 kg/m.  Tobacco History  Smoking Status  . Former Smoker  . Years: 15.00  . Types: Cigarettes  Smokeless Tobacco  . Never Used     Counseling given: Not Answered   Past Medical History:  Diagnosis Date  . Absolute anemia 12/08/2014  . Allergy   . Arthritis   . Bradycardia 05/03/2015  . Calculus of kidney 12/08/2014  . Coronary artery disease   . GERD (gastroesophageal reflux disease)   . History of kidney stones   . HLD (hyperlipidemia)   . Hyperglycemia 08/27/2015  . Hypertension   . Myocardial infarction ([place]) 2011  . Peripheral vascular disease ([place])   . VHD (valvular heart disease)    Past Surgical History:  Procedure Laterality Date  . CARDIAC CATHETERIZATION  2007  . CARDIAC CATHETERIZATION N/A 04/17/2015   Procedure: Left Heart Cath and Coronary Angiography;  Surgeon: Corey Skains, MD;  Location: [facility];  Service: Cardiovascular;  Laterality: N/A;  . CARPAL TUNNEL RELEASE Bilateral    right 06/09/08 left 05/03/09  . COLONOSCOPY N/A 10/02/2014   Procedure: COLONOSCOPY;  Surgeon: Manya Silvas, MD;  Location: [facility];  Service: Endoscopy;  Laterality: N/A;  . CORONARY ARTERY BYPASS GRAFT  2007  . HEMORROIDECTOMY    . KNEE ARTHROSCOPY Right 08/05/2016   Procedure: ARTHROSCOPY KNEE;  Surgeon: Thornton Park, MD;  Location: [facility];  Service: Orthopedics;  Laterality: Right;  . KNEE ARTHROSCOPY WITH LATERAL RELEASE Right 08/05/2016   Procedure: KNEE  ARTHROSCOPY WITH LATERAL RELEASE;  Surgeon: Thornton Park, MD;  Location: [facility];  Service: Orthopedics;  Laterality: Right;  . KNEE ARTHROSCOPY WITH MEDIAL MENISECTOMY Right 08/05/2016   Procedure: KNEE ARTHROSCOPY WITH MEDIAL MENISECTOMY;  Surgeon: Thornton Park, MD;  Location: [facility];  Service: Orthopedics;  Laterality: Right;  Partial  . LUMBAR EPIDURAL INJECTION  2011  . MOUTH SURGERY    . SHOULDER SURGERY Right   . SYNOVECTOMY Right 08/05/2016   Procedure: SYNOVECTOMY;  Surgeon: Thornton Park, MD;  Location: [facility];  Service: Orthopedics;  Laterality: Right;  arthroscopic extensive synovectomy  . TONSILLECTOMY     Family History  Problem Relation Age of Onset  . Heart disease Mother   . Heart disease Father   . Heart disease Brother   . Heart disease Brother   . Heart disease Brother   . Heart disease Brother   . Heart disease Brother    History  Sexual Activity  . Sexual activity: No    Outpatient Encounter Prescriptions as of 03/05/2017  Medication Sig  . amLODipine (NORVASC) 5 MG tablet Take 5 mg by mouth daily.  Marland Kitchen atorvastatin (LIPITOR) 20 MG tablet Take 20 mg by mouth every other day.   . Biotin 5000 MCG CAPS Take 5,000 mcg by mouth daily.   . Cholecalciferol (VITAMIN D3) 2000 UNITS capsule Take 2,000 Units by mouth 2 (two) times daily.   Marland Kitchen COENZYME Q-10 PO  Take 200 mg by mouth daily.   . dorzolamide-timolol (COSOPT) 22.3-6.8 MG/ML ophthalmic solution Place 1 drop into both eyes 2 (two) times daily.   Marland Kitchen glucosamine-chondroitin 500-400 MG tablet Take 1 tablet by mouth 2 (two) times daily.  . isosorbide mononitrate (IMDUR) 60 MG 24 hr tablet Take 60 mg by mouth daily.   . nitroGLYCERIN (NITROSTAT) 0.4 MG SL tablet Place 0.4 mg under the tongue every 5 (five) minutes as needed for chest pain.   Marland Kitchen OMEGA-3 FATTY ACIDS PO Take 1,200 mcg by mouth 2 (two) times daily.   Marland Kitchen omeprazole (PRILOSEC) 20 MG capsule Take 20 mg by mouth daily. Reported on 08/21/2015  . Polyethyl  Glycol-Propyl Glycol (SYSTANE OP) Apply 1 drop to eye daily as needed (Dry eyes).  . tamsulosin (FLOMAX) 0.4 MG CAPS capsule TAKE 1 CAPSULE BY MOUTH EVERY DAY  . Travoprost, BAK Free, (TRAVATAN Z) 0.004 % SOLN ophthalmic solution Place 1 drop into both eyes at bedtime.   . valsartan-hydrochlorothiazide (DIOVAN HCT) 320-25 MG per tablet Take 1 tablet by mouth daily.   . vitamin B-12 (CYANOCOBALAMIN) 1000 MCG tablet Take 1,000 mcg by mouth daily.  Marland Kitchen aspirin EC 81 MG tablet Take 81 mg by mouth daily.  . naproxen sodium (ANAPROX) 220 MG tablet Take 220-440 mg by mouth daily as needed (pain).  . [DISCONTINUED] ondansetron (ZOFRAN) 4 MG tablet Take 1 tablet (4 mg total) by mouth every 8 (eight) hours as needed for nausea or vomiting.   No facility-administered encounter medications on file as of 03/05/2017.     Activities of Daily Living In your present state of health, do you have any difficulty performing the following activities: 03/05/2017 07/28/2016  Hearing? N N  Vision? N N  Difficulty concentrating or making decisions? N N  Walking or climbing stairs? Y N  Comment occassional pain in R knee -  Dressing or bathing? N N  Doing errands, shopping? N N  Preparing Food and eating ? N -  Using the Toilet? N -  In the past six months, have you accidently leaked urine? N -  Do you have problems with loss of bowel control? N -  Managing your Medications? N -  Managing your Finances? N -  Housekeeping or managing your Housekeeping? N -  Some recent data might be hidden    Patient Care Team: Jerrol Banana., MD as PCP - General (Family Medicine) Lovell Sheehan, MD as Consulting Physician (Orthopedic Surgery)   Assessment:     Exercise Activities and Dietary recommendations Current Exercise Habits: The patient does not participate in regular exercise at present, Exercise limited by: orthopedic condition(s) (Scheduled for R partial knee replacement 03/06/17)  Goals    . Reduce  portion size          Recommend to continue to monitor portion sizes and to decrease sodium intake.      Fall Risk Fall Risk  03/05/2017 01/29/2017 12/30/2016 12/02/2016 11/10/2016  Falls in the past year? No No No No No  Risk for fall due to : Impaired balance/gait - - - -  Risk for fall due to: Comment occassional pain in R knee - - - -   Depression Screen PHQ 2/9 Scores 03/05/2017 01/29/2017 12/30/2016 12/02/2016  PHQ - 2 Score 0 0 0 0  Exception Documentation - - - -    Cognitive Function     6CIT Screen 03/05/2017  What Year? 0 points  What month? 0 points  What time?  0 points  Count back from 20 0 points  Months in reverse 0 points  Repeat phrase 0 points  Total Score 0    Immunization History  Administered Date(s) Administered  . Influenza, High Dose Seasonal PF 02/27/2016  . Influenza-Unspecified 03/03/2015  . Pneumococcal Conjugate-13 12/14/2013  . Pneumococcal Polysaccharide-23 04/04/1998, 03/25/2006  . Td 05/01/2008  . Tdap 02/25/2011  . Zoster 10/07/2011   Screening Tests Health Maintenance  Topic Date Due  . INFLUENZA VACCINE  05/04/2017 (Originally 12/31/2016)  . TETANUS/TDAP  02/24/2021  . PNA vac Low Risk Adult  Completed      Plan:   I have personally reviewed and addressed the Medicare Annual Wellness questionnaire and have noted the following in the patient's chart:  A. Medical and social history B. Use of alcohol, tobacco or illicit drugs  C. Current medications and supplements D. Functional ability and status E.  Nutritional status F.  Physical activity G. Advance directives H. List of other physicians I.  Hospitalizations, surgeries, and ER visits in previous 12 months J.  [place] such as hearing and vision if needed, cognitive and depression L. Referrals and appointments - none  In addition, I have reviewed and discussed with patient certain preventive protocols, quality metrics, and best practice recommendations. A written  personalized care plan for preventive services as well as general preventive health recommendations were provided to patient.  See attached scanned questionnaire for additional information.   Signed,  Aleatha Borer, LPN Nurse Health Advisor   MD Recommendations: None. Declined flu vaccine at the advice of his orthopedic surgeon.

## 2017-03-13 DIAGNOSIS — G8918 Other acute postprocedural pain: Secondary | ICD-10-CM | POA: Diagnosis not present

## 2017-03-13 DIAGNOSIS — E7800 Pure hypercholesterolemia, unspecified: Secondary | ICD-10-CM | POA: Diagnosis not present

## 2017-03-13 DIAGNOSIS — H579 Unspecified disorder of eye and adnexa: Secondary | ICD-10-CM | POA: Diagnosis not present

## 2017-03-13 DIAGNOSIS — M1711 Unilateral primary osteoarthritis, right knee: Secondary | ICD-10-CM | POA: Diagnosis not present

## 2017-03-13 DIAGNOSIS — I10 Essential (primary) hypertension: Secondary | ICD-10-CM | POA: Diagnosis not present

## 2017-03-13 DIAGNOSIS — Z87891 Personal history of nicotine dependence: Secondary | ICD-10-CM | POA: Diagnosis not present

## 2017-03-13 DIAGNOSIS — M25561 Pain in right knee: Secondary | ICD-10-CM | POA: Diagnosis not present

## 2017-03-13 DIAGNOSIS — M24561 Contracture, right knee: Secondary | ICD-10-CM | POA: Diagnosis not present

## 2017-03-13 DIAGNOSIS — K219 Gastro-esophageal reflux disease without esophagitis: Secondary | ICD-10-CM | POA: Diagnosis not present

## 2017-03-19 DIAGNOSIS — M25561 Pain in right knee: Secondary | ICD-10-CM | POA: Diagnosis not present

## 2017-03-19 DIAGNOSIS — R29898 Other symptoms and signs involving the musculoskeletal system: Secondary | ICD-10-CM | POA: Diagnosis not present

## 2017-03-23 DIAGNOSIS — R29898 Other symptoms and signs involving the musculoskeletal system: Secondary | ICD-10-CM | POA: Diagnosis not present

## 2017-03-23 DIAGNOSIS — M25561 Pain in right knee: Secondary | ICD-10-CM | POA: Diagnosis not present

## 2017-03-26 DIAGNOSIS — M25561 Pain in right knee: Secondary | ICD-10-CM | POA: Diagnosis not present

## 2017-03-26 DIAGNOSIS — R29898 Other symptoms and signs involving the musculoskeletal system: Secondary | ICD-10-CM | POA: Diagnosis not present

## 2017-03-26 DIAGNOSIS — R609 Edema, unspecified: Secondary | ICD-10-CM | POA: Diagnosis not present

## 2017-03-30 DIAGNOSIS — M25561 Pain in right knee: Secondary | ICD-10-CM | POA: Diagnosis not present

## 2017-03-30 DIAGNOSIS — R29898 Other symptoms and signs involving the musculoskeletal system: Secondary | ICD-10-CM | POA: Diagnosis not present

## 2017-03-30 DIAGNOSIS — R609 Edema, unspecified: Secondary | ICD-10-CM | POA: Diagnosis not present

## 2017-04-02 DIAGNOSIS — M25561 Pain in right knee: Secondary | ICD-10-CM | POA: Diagnosis not present

## 2017-04-02 DIAGNOSIS — M25661 Stiffness of right knee, not elsewhere classified: Secondary | ICD-10-CM | POA: Diagnosis not present

## 2017-04-02 DIAGNOSIS — R609 Edema, unspecified: Secondary | ICD-10-CM | POA: Diagnosis not present

## 2017-04-08 DIAGNOSIS — M25661 Stiffness of right knee, not elsewhere classified: Secondary | ICD-10-CM | POA: Diagnosis not present

## 2017-04-08 DIAGNOSIS — M25561 Pain in right knee: Secondary | ICD-10-CM | POA: Diagnosis not present

## 2017-04-15 DIAGNOSIS — M25561 Pain in right knee: Secondary | ICD-10-CM | POA: Diagnosis not present

## 2017-04-15 DIAGNOSIS — M25661 Stiffness of right knee, not elsewhere classified: Secondary | ICD-10-CM | POA: Diagnosis not present

## 2017-04-18 ENCOUNTER — Ambulatory Visit (INDEPENDENT_AMBULATORY_CARE_PROVIDER_SITE_OTHER): Payer: Medicare Other

## 2017-04-18 DIAGNOSIS — Z23 Encounter for immunization: Secondary | ICD-10-CM

## 2017-04-21 DIAGNOSIS — Z96651 Presence of right artificial knee joint: Secondary | ICD-10-CM | POA: Diagnosis not present

## 2017-05-13 DIAGNOSIS — M25512 Pain in left shoulder: Secondary | ICD-10-CM | POA: Diagnosis not present

## 2017-07-07 DIAGNOSIS — D485 Neoplasm of uncertain behavior of skin: Secondary | ICD-10-CM | POA: Diagnosis not present

## 2017-07-07 DIAGNOSIS — Z86018 Personal history of other benign neoplasm: Secondary | ICD-10-CM | POA: Diagnosis not present

## 2017-07-07 DIAGNOSIS — L570 Actinic keratosis: Secondary | ICD-10-CM | POA: Diagnosis not present

## 2017-07-07 DIAGNOSIS — L578 Other skin changes due to chronic exposure to nonionizing radiation: Secondary | ICD-10-CM | POA: Diagnosis not present

## 2017-07-07 DIAGNOSIS — L821 Other seborrheic keratosis: Secondary | ICD-10-CM | POA: Diagnosis not present

## 2017-07-09 DIAGNOSIS — H40003 Preglaucoma, unspecified, bilateral: Secondary | ICD-10-CM | POA: Diagnosis not present

## 2017-07-16 DIAGNOSIS — H353132 Nonexudative age-related macular degeneration, bilateral, intermediate dry stage: Secondary | ICD-10-CM | POA: Diagnosis not present

## 2017-07-20 DIAGNOSIS — I493 Ventricular premature depolarization: Secondary | ICD-10-CM | POA: Diagnosis not present

## 2017-07-20 DIAGNOSIS — I6523 Occlusion and stenosis of bilateral carotid arteries: Secondary | ICD-10-CM | POA: Diagnosis not present

## 2017-07-20 DIAGNOSIS — I25810 Atherosclerosis of coronary artery bypass graft(s) without angina pectoris: Secondary | ICD-10-CM | POA: Diagnosis not present

## 2017-07-20 DIAGNOSIS — I340 Nonrheumatic mitral (valve) insufficiency: Secondary | ICD-10-CM | POA: Diagnosis not present

## 2017-07-20 DIAGNOSIS — E782 Mixed hyperlipidemia: Secondary | ICD-10-CM | POA: Diagnosis not present

## 2017-07-20 DIAGNOSIS — I10 Essential (primary) hypertension: Secondary | ICD-10-CM | POA: Diagnosis not present

## 2017-07-21 DIAGNOSIS — D485 Neoplasm of uncertain behavior of skin: Secondary | ICD-10-CM | POA: Diagnosis not present

## 2017-07-27 ENCOUNTER — Telehealth: Payer: Self-pay | Admitting: Pain Medicine

## 2017-07-27 NOTE — Telephone Encounter (Signed)
Patient would like to come in for LESI. He had one last 12-30-16. Has worked good until now. Does he have to have an eval before coming in for procedure. Please ask Dr. Dossie Arbour and let front desk know

## 2017-07-28 NOTE — Telephone Encounter (Signed)
There are standing orders for LESI. Please call patient to schedule.

## 2017-08-04 DIAGNOSIS — D2271 Melanocytic nevi of right lower limb, including hip: Secondary | ICD-10-CM | POA: Diagnosis not present

## 2017-08-04 DIAGNOSIS — D485 Neoplasm of uncertain behavior of skin: Secondary | ICD-10-CM | POA: Diagnosis not present

## 2017-08-13 ENCOUNTER — Ambulatory Visit
Admission: RE | Admit: 2017-08-13 | Discharge: 2017-08-13 | Disposition: A | Discharge status: Home / Self Care | Payer: PPO | Source: Ambulatory Visit | Attending: Pain Medicine | Admitting: Pain Medicine

## 2017-08-13 ENCOUNTER — Encounter: Payer: Self-pay | Admitting: Pain Medicine

## 2017-08-13 ENCOUNTER — Ambulatory Visit (HOSPITAL_BASED_OUTPATIENT_CLINIC_OR_DEPARTMENT_OTHER): Payer: PPO | Admitting: Pain Medicine

## 2017-08-13 VITALS — BP 150/64 | HR 60 | Temp 97.9°F | Resp 17 | Ht 66.0 in | Wt 170.0 lb

## 2017-08-13 DIAGNOSIS — Z96651 Presence of right artificial knee joint: Secondary | ICD-10-CM | POA: Insufficient documentation

## 2017-08-13 DIAGNOSIS — G8929 Other chronic pain: Secondary | ICD-10-CM

## 2017-08-13 DIAGNOSIS — M48061 Spinal stenosis, lumbar region without neurogenic claudication: Secondary | ICD-10-CM | POA: Diagnosis not present

## 2017-08-13 DIAGNOSIS — Z951 Presence of aortocoronary bypass graft: Secondary | ICD-10-CM | POA: Diagnosis not present

## 2017-08-13 DIAGNOSIS — M5136 Other intervertebral disc degeneration, lumbar region: Secondary | ICD-10-CM | POA: Insufficient documentation

## 2017-08-13 DIAGNOSIS — Z9889 Other specified postprocedural states: Secondary | ICD-10-CM | POA: Insufficient documentation

## 2017-08-13 DIAGNOSIS — Z880 Allergy status to penicillin: Secondary | ICD-10-CM | POA: Diagnosis not present

## 2017-08-13 DIAGNOSIS — M9983 Other biomechanical lesions of lumbar region: Secondary | ICD-10-CM | POA: Insufficient documentation

## 2017-08-13 DIAGNOSIS — M5116 Intervertebral disc disorders with radiculopathy, lumbar region: Secondary | ICD-10-CM | POA: Insufficient documentation

## 2017-08-13 DIAGNOSIS — M545 Low back pain, unspecified: Secondary | ICD-10-CM

## 2017-08-13 DIAGNOSIS — M4310 Spondylolisthesis, site unspecified: Secondary | ICD-10-CM

## 2017-08-13 DIAGNOSIS — Z79899 Other long term (current) drug therapy: Secondary | ICD-10-CM | POA: Diagnosis not present

## 2017-08-13 DIAGNOSIS — M5416 Radiculopathy, lumbar region: Secondary | ICD-10-CM

## 2017-08-13 DIAGNOSIS — Z7982 Long term (current) use of aspirin: Secondary | ICD-10-CM | POA: Insufficient documentation

## 2017-08-13 DIAGNOSIS — M48062 Spinal stenosis, lumbar region with neurogenic claudication: Secondary | ICD-10-CM

## 2017-08-13 MED ORDER — CIPROFLOXACIN IN D5W 400 MG/200ML IV SOLN
400.0000 mg | Freq: Once | INTRAVENOUS | Status: AC
  Administered 2017-08-13: 400 mg via INTRAVENOUS
  Filled 2017-08-13: qty 200

## 2017-08-13 MED ORDER — IOPAMIDOL (ISOVUE-M 200) INJECTION 41%
10.0000 mL | Freq: Once | INTRAMUSCULAR | Status: AC
  Administered 2017-08-13: 10 mL via EPIDURAL
  Filled 2017-08-13: qty 10

## 2017-08-13 MED ORDER — TRIAMCINOLONE ACETONIDE 40 MG/ML IJ SUSP
40.0000 mg | Freq: Once | INTRAMUSCULAR | Status: AC
  Administered 2017-08-13: 40 mg
  Filled 2017-08-13: qty 1

## 2017-08-13 MED ORDER — CEFAZOLIN SODIUM 1 G IJ SOLR
INTRAMUSCULAR | Status: AC
  Filled 2017-08-13: qty 10

## 2017-08-13 MED ORDER — LIDOCAINE HCL 2 % IJ SOLN
10.0000 mL | Freq: Once | INTRAMUSCULAR | Status: AC
Start: 2017-08-13 — End: 2017-08-13
  Administered 2017-08-13: 400 mg
  Filled 2017-08-13: qty 20

## 2017-08-13 MED ORDER — SODIUM CHLORIDE 0.9% FLUSH
2.0000 mL | Freq: Once | INTRAVENOUS | Status: AC
  Administered 2017-08-13: 10 mL

## 2017-08-13 MED ORDER — ROPIVACAINE HCL 2 MG/ML IJ SOLN
2.0000 mL | Freq: Once | INTRAMUSCULAR | Status: AC
  Administered 2017-08-13: 10 mL via EPIDURAL
  Filled 2017-08-13: qty 10

## 2017-08-13 NOTE — Progress Notes (Signed)
Safety precautions to be maintained throughout the outpatient stay will include: orient to surroundings, keep bed in low position, maintain call bell within reach at all times, provide assistance with transfer out of bed and ambulation.  

## 2017-08-13 NOTE — Progress Notes (Signed)
Patient's Name: Austin [name]  MRN: [id number]  Referring Provider: Milinda Pointer, MD  DOB: June 24, 1928  PCP: Jerrol Banana., MD  DOS: 08/13/2017  Note by: Gaspar Cola, MD  Service setting: Ambulatory outpatient  Specialty: Interventional Pain Management  Patient type: Established  Location: [facility] (AMB) Pain Management Facility  Visit type: Interventional Procedure   Primary Reason for Visit: Interventional Pain Management Treatment. CC: Back Pain (lower, worse on the right)  Procedure:       Anesthesia, Analgesia, Anxiolysis:  Type: Palliative Inter-Laminar Epidural Steroid Injection          Region: Lumbar Level: L3-4 Level. Laterality: Right-Sided Paramedial  Type: Local Anesthesia Indication(s): Analgesia         Route: Infiltration ([place]/IM) IV Access: Declined Sedation: Declined  Local Anesthetic: Lidocaine 1-2%   Indications: 1. DDD (degenerative disc disease), lumbar   2. Chronic Lumbar radiculitis (intermittent/recurrent) (Bilateral)   3. Lumbar central spinal stenosis (Severe at L4-5)   4. Lumbar foraminal stenosis (Right L2-3; Bilateral L3-4, L4-5)    Pain Score: Pre-procedure: 7 /10 Post-procedure: 0-No pain/10  Pre-op Assessment:  Mr. Panik is a 84 y.o. (year old), male patient, seen today for interventional treatment. He  has a past surgical history that includes Shoulder surgery (Right); Coronary artery bypass graft (2007); Colonoscopy (N/A, 10/02/2014); Tonsillectomy; Hemorroidectomy; Carpal tunnel release (Bilateral); Lumbar epidural injection (2011); Mouth surgery; Cardiac catheterization (2007); Cardiac catheterization (N/A, 04/17/2015); Knee arthroscopy (Right, 08/05/2016); Knee arthroscopy with medial menisectomy (Right, 08/05/2016); Synovectomy (Right, 08/05/2016); Knee arthroscopy with lateral release (Right, 08/05/2016); and Joint replacement (Right, 03/13/2018). Mr. Utz has a current medication list which includes the following prescription(s):  amlodipine, aspirin ec, atorvastatin, biotin, vitamin d3, coenzyme q10, dorzolamide-timolol, glucosamine-chondroitin, isosorbide mononitrate, naproxen sodium, nitroglycerin, omega-3 fatty acids, omeprazole, polyethyl glycol-propyl glycol, tamsulosin, travoprost (bak free), valsartan-hydrochlorothiazide, and vitamin b-12. His primarily concern today is the Back Pain (lower, worse on the right)  Initial Vital Signs:  Pulse Rate: 60 Temp: 97.9 F (36.6 C) Resp: 16 BP: (!) 142/64 SpO2: 100 %  BMI: Estimated body mass index is 27.44 kg/m as calculated from the following:   Height as of this encounter: 5\' 6"  (1.676 m).   Weight as of this encounter: 170 lb (77.1 kg).  Risk Assessment: Allergies: Reviewed. He is allergic to penicillins.  Allergy Precautions: None required Coagulopathies: Reviewed. None identified.  Blood-thinner therapy: None at this time Active Infection(s): Reviewed. None identified. Mr. Hissong is afebrile  Site Confirmation: Mr. Reeves was asked to confirm the procedure and laterality before marking the site Procedure checklist: Completed Consent: Before the procedure and under the influence of no sedative(s), amnesic(s), or anxiolytics, the patient was informed of the treatment options, risks and possible complications. To fulfill our ethical and legal obligations, as recommended by the American Medical Association's Code of Ethics, I have informed the patient of my clinical impression; the nature and purpose of the treatment or procedure; the risks, benefits, and possible complications of the intervention; the alternatives, including doing nothing; the risk(s) and benefit(s) of the alternative treatment(s) or procedure(s); and the risk(s) and benefit(s) of doing nothing. The patient was provided information about the general risks and possible complications associated with the procedure. These may include, but are not limited to: failure to achieve desired goals, infection,  bleeding, organ or nerve damage, allergic reactions, paralysis, and death. In addition, the patient was informed of those risks and complications associated to Spine-related procedures, such as failure to decrease pain; infection (i.e.: Meningitis,  epidural or intraspinal abscess); bleeding (i.e.: epidural hematoma, subarachnoid hemorrhage, or any other type of intraspinal or peri-dural bleeding); organ or nerve damage (i.e.: Any type of peripheral nerve, nerve root, or spinal cord injury) with subsequent damage to sensory, motor, and/or autonomic systems, resulting in permanent pain, numbness, and/or weakness of one or several areas of the body; allergic reactions; (i.e.: anaphylactic reaction); and/or death. Furthermore, the patient was informed of those risks and complications associated with the medications. These include, but are not limited to: allergic reactions (i.e.: anaphylactic or anaphylactoid reaction(s)); adrenal axis suppression; blood sugar elevation that in diabetics may result in ketoacidosis or comma; water retention that in patients with history of congestive heart failure may result in shortness of breath, pulmonary edema, and decompensation with resultant heart failure; weight gain; swelling or edema; medication-induced neural toxicity; particulate matter embolism and blood vessel occlusion with resultant organ, and/or nervous system infarction; and/or aseptic necrosis of one or more joints. Finally, the patient was informed that Medicine is not an exact science; therefore, there is also the possibility of unforeseen or unpredictable risks and/or possible complications that may result in a catastrophic outcome. The patient indicated having understood very clearly. We have given the patient no guarantees and we have made no promises. Enough time was given to the patient to ask questions, all of which were answered to the patient's satisfaction. Mr. Saini has indicated that he wanted to  continue with the procedure. Attestation: I, the ordering provider, attest that I have discussed with the patient the benefits, risks, side-effects, alternatives, likelihood of achieving goals, and potential problems during recovery for the procedure that I have provided informed consent. Date  Time: 08/13/2017  8:32 AM  Pre-Procedure Preparation:  Monitoring: As per clinic protocol. Respiration, ETCO2, SpO2, BP, heart rate and rhythm monitor placed and checked for adequate function Safety Precautions: Patient was assessed for positional comfort and pressure points before starting the procedure. Time-out: I initiated and conducted the "Time-out" before starting the procedure, as per protocol. The patient was asked to participate by confirming the accuracy of the "Time Out" information. Verification of the correct person, site, and procedure were performed and confirmed by me, the nursing staff, and the patient. "Time-out" conducted as per Joint Commission's Universal Protocol (UP.01.01.01). Time: 1048  Description of Procedure:       Position: Prone with head of the table was raised to facilitate breathing. Target Area: The interlaminar space, initially targeting the lower laminar border of the superior vertebral body. Approach: Paramedial approach. Area Prepped: Entire Posterior Lumbar Region Prepping solution: ChloraPrep (2% chlorhexidine gluconate and 70% isopropyl alcohol) Safety Precautions: Aspiration looking for blood return was conducted prior to all injections. At no point did we inject any substances, as a needle was being advanced. No attempts were made at seeking any paresthesias. Safe injection practices and needle disposal techniques used. Medications properly checked for expiration dates. SDV (single dose vial) medications used. Description of the Procedure: Protocol guidelines were followed. The procedure needle was introduced through the skin, ipsilateral to the reported pain, and  advanced to the target area. Bone was contacted and the needle walked caudad, until the lamina was cleared. The epidural space was identified using "loss-of-resistance technique" with 2-3 ml of PF-NaCl (0.9% NSS), in a 5cc LOR glass syringe. Vitals:   08/13/17 1045 08/13/17 1050 08/13/17 1055 08/13/17 1100  BP: 134/62 (!) 145/69 (!) 146/60 (!) 150/64  Pulse:      Resp: 15 16 18 17   Temp:  TempSrc:      SpO2: 100% 100% 100% 100%  Weight:      Height:        Start Time: 1048 hrs. End Time:   hrs. Materials:  Needle(s) Type: Epidural needle Gauge: 17G Length: 3.5-in Medication(s): Please see orders for medications and dosing details.  Imaging Guidance (Spinal):  Type of Imaging Technique: Fluoroscopy Guidance (Spinal) Indication(s): Assistance in needle guidance and placement for procedures requiring needle placement in or near specific anatomical locations not easily accessible without such assistance. Exposure Time: Please see nurses notes. Contrast: Before injecting any contrast, we confirmed that the patient did not have an allergy to iodine, shellfish, or radiological contrast. Once satisfactory needle placement was completed at the desired level, radiological contrast was injected. Contrast injected under live fluoroscopy. No contrast complications. See chart for type and volume of contrast used. Fluoroscopic Guidance: I was personally present during the use of fluoroscopy. "Tunnel Vision Technique" used to obtain the best possible view of the target area. Parallax error corrected before commencing the procedure. "Direction-depth-direction" technique used to introduce the needle under continuous pulsed fluoroscopy. Once target was reached, antero-posterior, oblique, and lateral fluoroscopic projection used confirm needle placement in all planes. Images permanently stored in EMR. Interpretation: I personally interpreted the imaging intraoperatively. Adequate needle placement confirmed  in multiple planes. Appropriate spread of contrast into desired area was observed. No evidence of afferent or efferent intravascular uptake. No intrathecal or subarachnoid spread observed. Permanent images saved into the patient's record.  Antibiotic Prophylaxis:   Anti-infectives (From admission, onward)   Start     Dose/Rate Route Frequency Ordered Stop   08/13/17 0930  ciprofloxacin (CIPRO) IVPB 400 mg     400 mg 200 mL/hr over 60 Minutes Intravenous  Once 08/13/17 0924 08/13/17 1050     Indication(s): Orthopedic Prosthesis of less than 6 months since implant.  Post-operative Assessment:  Post-procedure Vital Signs:  Pulse Rate: 60 Temp: 97.9 F (36.6 C) Resp: 17 BP: (!) 150/64 SpO2: 100 %  EBL: None  Complications: No immediate post-treatment complications observed by team, or reported by patient.  Note: The patient tolerated the entire procedure well. A repeat set of vitals were taken after the procedure and the patient was kept under observation following institutional policy, for this type of procedure. Post-procedural neurological assessment was performed, showing return to baseline, prior to discharge. The patient was provided with post-procedure discharge instructions, including a section on how to identify potential problems. Should any problems arise concerning this procedure, the patient was given instructions to immediately contact us, at any time, without hesitation. In any case, we plan to contact the patient by telephone for a follow-up status report regarding this interventional procedure.  Comments:  No additional relevant information.  Plan of Care    Imaging Orders     DG C-Arm 1-60 Min-No Report  Procedure Orders     Lumbar Epidural Injection  Medications ordered for procedure: Meds ordered this encounter  Medications  . iopamidol (ISOVUE-M) 41 % intrathecal injection 10 mL    Must be Myelogram-compatible. If not available, you may substitute with a  water-soluble, non-ionic, hypoallergenic, myelogram-compatible radiological contrast medium.  Marland Kitchen lidocaine (XYLOCAINE) 2 % (with pres) injection 200 mg  . sodium chloride flush (NS) 0.9 % injection 2 mL  . ropivacaine (PF) 2 mg/mL (0.2%) (NAROPIN) injection 2 mL  . triamcinolone acetonide (KENALOG-40) injection 40 mg  . ciprofloxacin (CIPRO) IVPB 400 mg    Order Specific Question:   Antibiotic  Indication:    Answer:   Surgical Prophylaxis    Order Specific Question:   Other Indication:    Answer:   Orthopedic implant of less than 6 months   Medications administered: We administered iopamidol, lidocaine, sodium chloride flush, ropivacaine (PF) 2 mg/mL (0.2%), triamcinolone acetonide, and ciprofloxacin.  See the medical record for exact dosing, route, and time of administration.  New Prescriptions   No medications on file   Disposition: Discharge home  Discharge Date & Time: 08/13/2017; 1103 hrs.   Physician-requested Follow-up: Return for post-procedure eval (2 wks), w/ Dr. Dossie Arbour.  Future Appointments  Date Time Provider [place]  08/26/2017  2:00 PM Milinda Pointer, MD [facility] None  09/09/2017  8:20 AM Jerrol Banana., MD [facility] None  11/06/2017  8:30 AM [facility] None  11/06/2017  9:30 AM Dew, Erskine Squibb, MD [facility] None   Primary Care Physician: Jerrol Banana., MD Location: [facility] Note by: Gaspar Cola, MD Date: 08/13/2017; Time: 11:42 AM  Disclaimer:  Medicine is not an Chief Strategy Officer. The only guarantee in medicine is that nothing is guaranteed. It is important to note that the decision to proceed with this intervention was based on the information collected from the patient. The Data and conclusions were drawn from the patient's questionnaire, the interview, and the physical examination. Because the information was provided in large part by the patient, it cannot be guaranteed that it has not been  purposely or unconsciously manipulated. Every effort has been made to obtain as much relevant data as possible for this evaluation. It is important to note that the conclusions that lead to this procedure are derived in large part from the available data. Always take into account that the treatment will also be dependent on availability of resources and existing treatment guidelines, considered by other Pain Management Practitioners as being common knowledge and practice, at the time of the intervention. For Medico-Legal purposes, it is also important to point out that variation in procedural techniques and pharmacological choices are the acceptable norm. The indications, contraindications, technique, and results of the above procedure should only be interpreted and judged by a Board-Certified Interventional Pain Specialist with extensive familiarity and expertise in the same exact procedure and technique.

## 2017-08-13 NOTE — Patient Instructions (Signed)

## 2017-08-14 ENCOUNTER — Telehealth: Payer: Self-pay

## 2017-08-14 NOTE — Telephone Encounter (Signed)
Post procedure phone call.  Patient states he is doing good.  

## 2017-08-25 DIAGNOSIS — M79606 Pain in leg, unspecified: Secondary | ICD-10-CM | POA: Insufficient documentation

## 2017-08-25 DIAGNOSIS — G8929 Other chronic pain: Secondary | ICD-10-CM | POA: Insufficient documentation

## 2017-08-25 DIAGNOSIS — M47817 Spondylosis without myelopathy or radiculopathy, lumbosacral region: Secondary | ICD-10-CM | POA: Insufficient documentation

## 2017-08-25 DIAGNOSIS — M79604 Pain in right leg: Secondary | ICD-10-CM

## 2017-08-25 NOTE — Progress Notes (Deleted)
Patient's Name: Austin [name]  MRN: [id number]  Referring Provider: Jerrol Banana.,*  DOB: July 27, 1928  PCP: Jerrol Banana., MD  DOS: 08/26/2017  Note by: Gaspar Cola, MD  Service setting: Ambulatory outpatient  Specialty: Interventional Pain Management  Location: [facility] (AMB) Pain Management Facility    Patient type: Established   Primary Reason(s) for Visit: Encounter for post-procedure evaluation of chronic illness with mild to moderate exacerbation CC: No chief complaint on file.  HPI  Austin [name] is a 84 y.o. year old, male patient, who comes today for a post-procedure evaluation. He has History of colonic polyps; Benign essential HTN; Benign fibroma of prostate; Atherosclerosis of coronary artery; Cataract; Esophageal stenosis; Acid reflux; Glaucoma; Hemorrhoid; HLD (hyperlipidemia); Obstructive apnea; Borderline diabetes; Esophagogastric ring; Heart valve disease; MI (mitral incompetence); Cardiomyopathy, ischemic; Coronary artery disease involving native coronary artery of native heart with unstable angina pectoris ([place]); Chronic low back pain (Primary Source of Pain) (Bilateral) (R>L); Lumbar spondylosis; Lumbar facet syndrome (Bilateral) (R>L); Peripheral vascular disease ([place]); Lumbar facet hypertrophy (multilevel); Grade 1 Anterolisthesis of L3 over L4; Lumbar foraminal stenosis (Right L2-3; Bilateral L3-4, L4-5); Carotid stenosis; Chronic knee pain (Right); Full thickness rotator cuff tear; Stiffness of shoulder joint; Tear of medial meniscus of knee; Weakness of limb; Osteoarthritis of knee (Right); Chronic pain syndrome; Lumbar central spinal stenosis (Severe at L4-5); Chronic Lumbar radiculitis (intermittent/recurrent) (Bilateral); Easy bruising; DDD (degenerative disc disease), lumbar; Status post right partial knee replacement; Chronic lower extremity pain (Right); and Spondylosis without myelopathy or radiculopathy, lumbosacral region on their problem list. His  primarily concern today is the No chief complaint on file.  Pain Assessment: Location:     Radiating:   Onset:   Duration:   Quality:   Severity:  /10 (self-reported pain score)  Note: Reported level is compatible with observation.                         When using our objective Pain Scale, levels between 6 and 10/10 are said to belong in an emergency room, as it progressively worsens from a 6/10, described as severely limiting, requiring emergency care not usually available at an outpatient pain management facility. At a 6/10 level, communication becomes difficult and requires great effort. Assistance to reach the emergency department may be required. Facial flushing and profuse sweating along with potentially dangerous increases in heart rate and blood pressure will be evident. Effect on ADL:   Timing:   Modifying factors:    Austin [name] comes in today for post-procedure evaluation after the treatment done on 08/13/2017.  Further details on both, my assessment(s), as well as the proposed treatment plan, please see below.  Post-Procedure Assessment  08/13/2017 Procedure: Diagnostic right-sided L3-4 interlaminar LESI #4 under fluoroscopic guidance, no sedation Pre-procedure pain score:  7/10 Post-procedure pain score: 0/10 (100% relief) Influential Factors: BMI:   Intra-procedural challenges: None observed.         Assessment challenges: None detected.              Reported side-effects: None.        Post-procedural adverse reactions or complications: None reported         Sedation: Please see nurses note. When no sedatives are used, the analgesic levels obtained are directly associated to the effectiveness of the local anesthetics. However, when sedation is provided, the level of analgesia obtained during the initial 1 hour following the intervention, is believed to be the result  of a combination of factors. These factors may include, but are not limited to: 1. The effectiveness of the  local anesthetics used. 2. The effects of the analgesic(s) and/or anxiolytic(s) used. 3. The degree of discomfort experienced by the patient at the time of the procedure. 4. The patients ability and reliability in recalling and recording the events. 5. The presence and influence of possible secondary gains and/or psychosocial factors. Reported result: Relief experienced during the 1st hour after the procedure:   (Ultra-Short Term Relief)            Interpretative annotation: Clinically appropriate result. Analgesia during this period is likely to be Local Anesthetic and/or IV Sedative (Analgesic/Anxiolytic) related.          Effects of local anesthetic: The analgesic effects attained during this period are directly associated to the localized infiltration of local anesthetics and therefore cary significant diagnostic value as to the etiological location, or anatomical origin, of the pain. Expected duration of relief is directly dependent on the pharmacodynamics of the local anesthetic used. Long-acting (4-6 hours) anesthetics used.  Reported result: Relief during the next 4 to 6 hour after the procedure:   (Short-Term Relief)            Interpretative annotation: Clinically appropriate result. Analgesia during this period is likely to be Local Anesthetic-related.          Long-term benefit: Defined as the period of time past the expected duration of local anesthetics (1 hour for short-acting and 4-6 hours for long-acting). With the possible exception of prolonged sympathetic blockade from the local anesthetics, benefits during this period are typically attributed to, or associated with, other factors such as analgesic sensory neuropraxia, antiinflammatory effects, or beneficial biochemical changes provided by agents other than the local anesthetics.  Reported result: Extended relief following procedure:   (Long-Term Relief)            Interpretative annotation: Clinically appropriate result. Good  relief. No permanent benefit expected. Inflammation plays a part in the etiology to the pain.          Current benefits: Defined as reported results that persistent at this point in time.   Analgesia: *** %            Function: Somewhat improved ROM: Somewhat improved Interpretative annotation: Recurrence of symptoms. No permanent benefit expected. Effective diagnostic intervention.          Interpretation: Results would suggest a successful diagnostic intervention.                  Plan:  Please see "Plan of Care" for details.                Laboratory Chemistry  Renal Function Markers Lab Results  Component Value Date   BUN 17 03/05/2017   CREATININE 1.50 (H) 03/05/2017   GFRAA 47 (L) 07/28/2016   GFRNONAA 40 (L) 07/28/2016                              Hepatic Function Markers Lab Results  Component Value Date   AST 18 03/05/2017   ALT 17 03/05/2017   ALBUMIN 4.4 02/27/2016   ALKPHOS 81 02/27/2016                        Electrolytes Lab Results  Component Value Date   NA 137 03/05/2017   K 4.1 03/05/2017   CL  102 03/05/2017   CALCIUM 9.4 03/05/2017                        Neuropathy Markers Lab Results  Component Value Date   HGBA1C 5.7 09/29/2016                        Coagulation Parameters Lab Results  Component Value Date   INR 1.03 07/28/2016   LABPROT 13.5 07/28/2016   APTT 41 (H) 07/28/2016   PLT 203 03/05/2017                        Cardiovascular Markers Lab Results  Component Value Date   CKTOTAL 142 01/08/2015   HGB 14.1 03/05/2017   HCT 41.4 03/05/2017                         Note: Lab results reviewed.  Recent Diagnostic Imaging Results  DG C-Arm 1-60 Min-No Report Fluoroscopy was utilized by the requesting physician.  No radiographic  interpretation.   Complexity Note: I personally reviewed the fluoroscopic imaging of the procedure.                        Meds   Current Outpatient Medications:  .  amLODipine (NORVASC) 5 MG  tablet, Take 5 mg by mouth daily., Disp: , Rfl:  .  aspirin EC 81 MG tablet, Take 81 mg by mouth daily., Disp: , Rfl:  .  atorvastatin (LIPITOR) 20 MG tablet, Take 20 mg by mouth every other day. , Disp: , Rfl:  .  Biotin 5000 MCG CAPS, Take 5,000 mcg by mouth daily. , Disp: , Rfl:  .  Cholecalciferol (VITAMIN D3) 2000 UNITS capsule, Take 2,000 Units by mouth 2 (two) times daily. , Disp: , Rfl:  .  COENZYME Q-10 PO, Take 200 mg by mouth daily. , Disp: , Rfl:  .  dorzolamide-timolol (COSOPT) 22.3-6.8 MG/ML ophthalmic solution, Place 1 drop into both eyes 2 (two) times daily. , Disp: , Rfl:  .  glucosamine-chondroitin 500-400 MG tablet, Take 1 tablet by mouth 2 (two) times daily., Disp: , Rfl:  .  isosorbide mononitrate (IMDUR) 60 MG 24 hr tablet, Take 60 mg by mouth daily. , Disp: , Rfl:  .  naproxen sodium (ANAPROX) 220 MG tablet, Take 220-440 mg by mouth daily as needed (pain)., Disp: , Rfl:  .  nitroGLYCERIN (NITROSTAT) 0.4 MG SL tablet, Place 0.4 mg under the tongue every 5 (five) minutes as needed for chest pain. , Disp: , Rfl:  .  OMEGA-3 FATTY ACIDS PO, Take 1,200 mcg by mouth 2 (two) times daily. , Disp: , Rfl:  .  omeprazole (PRILOSEC) 20 MG capsule, Take 20 mg by mouth daily. Reported on 08/21/2015, Disp: , Rfl:  .  Polyethyl Glycol-Propyl Glycol (SYSTANE OP), Apply 1 drop to eye daily as needed (Dry eyes)., Disp: , Rfl:  .  tamsulosin (FLOMAX) 0.4 MG CAPS capsule, TAKE 1 CAPSULE BY MOUTH EVERY DAY, Disp: 30 capsule, Rfl: 11 .  Travoprost, BAK Free, (TRAVATAN Z) 0.004 % SOLN ophthalmic solution, Place 1 drop into both eyes at bedtime. , Disp: , Rfl:  .  valsartan-hydrochlorothiazide (DIOVAN HCT) 320-25 MG per tablet, Take 1 tablet by mouth daily. , Disp: , Rfl:  .  vitamin B-12 (CYANOCOBALAMIN) 1000 MCG tablet, Take 1,000 mcg by mouth daily., Disp: , Rfl:  ROS  Constitutional: Denies any fever or chills Gastrointestinal: No reported hemesis, hematochezia, vomiting, or acute GI  distress Musculoskeletal: Denies any acute onset joint swelling, redness, loss of ROM, or weakness Neurological: No reported episodes of acute onset apraxia, aphasia, dysarthria, agnosia, amnesia, paralysis, loss of coordination, or loss of consciousness  Allergies  Austin [name] is allergic to penicillins.  PFSH  Drug: Austin [name]  reports that he does not use drugs. Alcohol:  reports that he drinks alcohol. Tobacco:  reports that he has quit smoking. His smoking use included cigarettes. He quit after 15.00 years of use. He has never used smokeless tobacco. Medical:  has a past medical history of Absolute anemia (12/08/2014), Allergy, Arthritis, Bradycardia (05/03/2015), Calculus of kidney (12/08/2014), Coronary artery disease, GERD (gastroesophageal reflux disease), History of kidney stones, HLD (hyperlipidemia), Hyperglycemia (08/27/2015), Hypertension, Myocardial infarction ([place]) (2011), Peripheral vascular disease ([place]), and VHD (valvular heart disease). Surgical: Austin [name]  has a past surgical history that includes Shoulder surgery (Right); Coronary artery bypass graft (2007); Colonoscopy (N/A, 10/02/2014); Tonsillectomy; Hemorroidectomy; Carpal tunnel release (Bilateral); Lumbar epidural injection (2011); Mouth surgery; Cardiac catheterization (2007); Cardiac catheterization (N/A, 04/17/2015); Knee arthroscopy (Right, 08/05/2016); Knee arthroscopy with medial menisectomy (Right, 08/05/2016); Synovectomy (Right, 08/05/2016); Knee arthroscopy with lateral release (Right, 08/05/2016); and Joint replacement (Right, 03/13/2018). Family: family history includes Heart disease in his brother, brother, brother, brother, brother, father, and mother.  Constitutional Exam  General appearance: Well nourished, well developed, and well hydrated. In no apparent acute distress There were no vitals filed for this visit. BMI Assessment: Estimated body mass index is 27.44 kg/m as calculated from the following:   Height as of  08/13/17: 5\' 6"  (1.676 m).   Weight as of 08/13/17: 170 lb (77.1 kg).  BMI interpretation table: BMI level Category Range association with higher incidence of chronic pain  <18 kg/m2 Underweight   18.5-24.9 kg/m2 Ideal body weight   25-29.9 kg/m2 Overweight Increased incidence by 20%  30-34.9 kg/m2 Obese (Class I) Increased incidence by 68%  35-39.9 kg/m2 Severe obesity (Class II) Increased incidence by 136%  >40 kg/m2 Extreme obesity (Class III) Increased incidence by 254%   BMI Readings from Last 4 Encounters:  08/13/17 27.44 kg/m  03/05/17 27.25 kg/m  02/09/17 27.25 kg/m  01/29/17 26.63 kg/m   Wt Readings from Last 4 Encounters:  08/13/17 170 lb (77.1 kg)  03/05/17 174 lb (78.9 kg)  02/09/17 174 lb (78.9 kg)  01/29/17 170 lb (77.1 kg)  Psych/Mental status: Alert, oriented x 3 (person, place, & time)       Eyes: PERLA Respiratory: No evidence of acute respiratory distress  Cervical Spine Area Exam  Skin & Axial Inspection: No masses, redness, edema, swelling, or associated skin lesions Alignment: Symmetrical Functional ROM: Unrestricted ROM      Stability: No instability detected Muscle Tone/Strength: Functionally intact. No obvious neuro-muscular anomalies detected. Sensory (Neurological): Unimpaired Palpation: No palpable anomalies              Upper Extremity (UE) Exam    Side: Right upper extremity  Side: Left upper extremity  Skin & Extremity Inspection: Skin color, temperature, and hair growth are WNL. No peripheral edema or cyanosis. No masses, redness, swelling, asymmetry, or associated skin lesions. No contractures.  Skin & Extremity Inspection: Skin color, temperature, and hair growth are WNL. No peripheral edema or cyanosis. No masses, redness, swelling, asymmetry, or associated skin lesions. No contractures.  Functional ROM: Unrestricted ROM  Functional ROM: Unrestricted ROM          Muscle Tone/Strength: Functionally intact. No obvious neuro-muscular  anomalies detected.  Muscle Tone/Strength: Functionally intact. No obvious neuro-muscular anomalies detected.  Sensory (Neurological): Unimpaired          Sensory (Neurological): Unimpaired          Palpation: No palpable anomalies              Palpation: No palpable anomalies              Specialized Test(s): Deferred         Specialized Test(s): Deferred          Thoracic Spine Area Exam  Skin & Axial Inspection: No masses, redness, or swelling Alignment: Symmetrical Functional ROM: Unrestricted ROM Stability: No instability detected Muscle Tone/Strength: Functionally intact. No obvious neuro-muscular anomalies detected. Sensory (Neurological): Unimpaired Muscle strength & Tone: No palpable anomalies  Lumbar Spine Area Exam  Skin & Axial Inspection: No masses, redness, or swelling Alignment: Symmetrical Functional ROM: Unrestricted ROM      Stability: No instability detected Muscle Tone/Strength: Functionally intact. No obvious neuro-muscular anomalies detected. Sensory (Neurological): Unimpaired Palpation: No palpable anomalies       Provocative Tests: Lumbar Hyperextension and rotation test: evaluation deferred today       Lumbar Lateral bending test: evaluation deferred today       Patrick's Maneuver: evaluation deferred today                    Gait & Posture Assessment  Ambulation: Unassisted Gait: Relatively normal for age and body habitus Posture: WNL   Lower Extremity Exam    Side: Right lower extremity  Side: Left lower extremity  Skin & Extremity Inspection: Skin color, temperature, and hair growth are WNL. No peripheral edema or cyanosis. No masses, redness, swelling, asymmetry, or associated skin lesions. No contractures.  Skin & Extremity Inspection: Skin color, temperature, and hair growth are WNL. No peripheral edema or cyanosis. No masses, redness, swelling, asymmetry, or associated skin lesions. No contractures.  Functional ROM: Unrestricted ROM           Functional ROM: Unrestricted ROM          Muscle Tone/Strength: Functionally intact. No obvious neuro-muscular anomalies detected.  Muscle Tone/Strength: Functionally intact. No obvious neuro-muscular anomalies detected.  Sensory (Neurological): Unimpaired  Sensory (Neurological): Unimpaired  Palpation: No palpable anomalies  Palpation: No palpable anomalies   Assessment  Primary Diagnosis & Pertinent Problem List: The primary encounter diagnosis was Chronic Lumbar radiculitis (intermittent/recurrent) (Bilateral). Diagnoses of Chronic low back pain (Location of Primary Source of Pain) (Bilateral) (R>L), Chronic lower extremity pain (Right), Spondylosis without myelopathy or radiculopathy, lumbosacral region, and Lumbar foraminal stenosis (Right L2-3; Bilateral L3-4, L4-5) were also pertinent to this visit.  Status Diagnosis  Controlled Controlled Controlled 1. Chronic Lumbar radiculitis (intermittent/recurrent) (Bilateral)   2. Chronic low back pain (Location of Primary Source of Pain) (Bilateral) (R>L)   3. Chronic lower extremity pain (Right)   4. Spondylosis without myelopathy or radiculopathy, lumbosacral region   5. Lumbar foraminal stenosis (Right L2-3; Bilateral L3-4, L4-5)     Problems updated and reviewed during this visit: Problem  Chronic lower extremity pain (Right)  Spondylosis Without Myelopathy Or Radiculopathy, Lumbosacral Region  Chronic low back pain (Primary Source of Pain) (Bilateral) (R>L)   Plan of Care  Pharmacotherapy (Medications Ordered): No orders of the defined types were placed in this encounter.  Medications administered today: Delorise Shiner  Austin [name] "Mikki Santee" had no medications administered during this visit.  Procedure Orders    No procedure(s) ordered today   Lab Orders  No laboratory test(s) ordered today   Imaging Orders  No imaging studies ordered today   Referral Orders  No referral(s) requested today    Interventional management  options: Planned, scheduled, and/or pending:   ***   Considering:   Palliative right-sided L4-5 LESI Palliative bilateral lumbar facet block #3.  Diagnostic right-sided L2-3 transforaminal LESI + bilateral L3-4 and/or L4-5transforaminal LESI   Palliative PRN treatment(s):   Palliative right-sided L3-4 LESI Palliative right-sided L2-3 transforaminal LESI + bilateral L3-4   Provider-requested follow-up: No follow-ups on file.  Future Appointments  Date Time Provider [place]  08/26/2017  2:00 PM Milinda Pointer, MD [facility] None  09/09/2017  8:20 AM Jerrol Banana., MD [facility] None  11/06/2017  8:30 AM [facility] None  11/06/2017  9:30 AM Dew, Erskine Squibb, MD [facility] None   Primary Care Physician: Jerrol Banana., MD Location: [facility] Out[facility] Management Facility Note by: Gaspar Cola, MD Date: 08/26/2017; Time: 4:04 PM

## 2017-08-26 ENCOUNTER — Ambulatory Visit: Payer: PPO | Admitting: Pain Medicine

## 2017-09-09 ENCOUNTER — Ambulatory Visit (INDEPENDENT_AMBULATORY_CARE_PROVIDER_SITE_OTHER): Payer: PPO | Admitting: Family Medicine

## 2017-09-09 ENCOUNTER — Encounter: Payer: Self-pay | Admitting: Family Medicine

## 2017-09-09 VITALS — BP 120/52 | HR 60 | Temp 97.8°F | Resp 16 | Wt 172.0 lb

## 2017-09-09 DIAGNOSIS — I6523 Occlusion and stenosis of bilateral carotid arteries: Secondary | ICD-10-CM

## 2017-09-09 DIAGNOSIS — R7303 Prediabetes: Secondary | ICD-10-CM

## 2017-09-09 DIAGNOSIS — E785 Hyperlipidemia, unspecified: Secondary | ICD-10-CM | POA: Diagnosis not present

## 2017-09-09 DIAGNOSIS — I25110 Atherosclerotic heart disease of native coronary artery with unstable angina pectoris: Secondary | ICD-10-CM | POA: Diagnosis not present

## 2017-09-09 DIAGNOSIS — I10 Essential (primary) hypertension: Secondary | ICD-10-CM | POA: Diagnosis not present

## 2017-09-09 NOTE — Progress Notes (Signed)
Patient: Austin [name] Male    DOB: Mar 20, 1929   84 y.o.   MRN: [id number] Visit Date: 09/09/2017  Today's Provider: Wilhemena Durie, MD   Chief Complaint  Patient presents with  . Hypertension  . Hyperlipidemia   Subjective:    HPI  Hypertension, follow-up:  BP Readings from Last 3 Encounters:  09/09/17 (!) 120/52  08/13/17 (!) 150/64  03/05/17 (!) 142/60    He was last seen for hypertension 6 months ago.  BP at that visit was 150/64. Management since that visit includes none. He reports good compliance with treatment. He is not having side effects.  He is exercising, using exercise bike and he is still active around the house with house work. He is adherent to low salt diet.   Outside blood pressures are 112-120's/50's. He is experiencing chest pain, dyspnea and fatigue, occasionally with over exertion. This is not new for pt.  Patient denies chest pressure/discomfort, claudication, irregular heart beat, lower extremity edema, near-syncope, orthopnea, palpitations, paroxysmal nocturnal dyspnea, syncope and tachypnea.   Cardiovascular risk factors include advanced age (older than 99 for men, 63 for women), dyslipidemia, hypertension and male gender.   Wt Readings from Last 3 Encounters:  09/09/17 172 lb (78 kg)  08/13/17 170 lb (77.1 kg)  03/05/17 174 lb (78.9 kg)   ------------------------------------------------------------------------    Allergies  Allergen Reactions  . Penicillins Itching, Swelling and Rash    Has patient had a PCN reaction causing immediate rash, facial/tongue/throat swelling, SOB or lightheadedness with hypotension: Yes Has patient had a PCN reaction causing severe rash involving mucus membranes or skin necrosis: No Has patient had a PCN reaction that required hospitalization No Has patient had a PCN reaction occurring within the last 10 years: No If all of the above answers are "NO", then may proceed with Cephalosporin use.       Current Outpatient Medications:  .  amLODipine (NORVASC) 5 MG tablet, Take 5 mg by mouth daily., Disp: , Rfl:  .  aspirin EC 81 MG tablet, Take 81 mg by mouth daily., Disp: , Rfl:  .  atorvastatin (LIPITOR) 20 MG tablet, Take 20 mg by mouth every other day. , Disp: , Rfl:  .  Biotin 5000 MCG CAPS, Take 5,000 mcg by mouth daily. , Disp: , Rfl:  .  Cholecalciferol (VITAMIN D3) 2000 UNITS capsule, Take 2,000 Units by mouth 2 (two) times daily. , Disp: , Rfl:  .  COENZYME Q-10 PO, Take 200 mg by mouth daily. , Disp: , Rfl:  .  dorzolamide-timolol (COSOPT) 22.3-6.8 MG/ML ophthalmic solution, Place 1 drop into both eyes 2 (two) times daily. , Disp: , Rfl:  .  glucosamine-chondroitin 500-400 MG tablet, Take 1 tablet by mouth 2 (two) times daily., Disp: , Rfl:  .  isosorbide mononitrate (IMDUR) 60 MG 24 hr tablet, Take 60 mg by mouth daily. , Disp: , Rfl:  .  naproxen sodium (ANAPROX) 220 MG tablet, Take 220-440 mg by mouth daily as needed (pain)., Disp: , Rfl:  .  nitroGLYCERIN (NITROSTAT) 0.4 MG SL tablet, Place 0.4 mg under the tongue every 5 (five) minutes as needed for chest pain. , Disp: , Rfl:  .  OMEGA-3 FATTY ACIDS PO, Take 1,200 mcg by mouth 2 (two) times daily. , Disp: , Rfl:  .  omeprazole (PRILOSEC) 20 MG capsule, Take 20 mg by mouth daily. Reported on 08/21/2015, Disp: , Rfl:  .  Polyethyl Glycol-Propyl Glycol (SYSTANE OP), Apply  1 drop to eye daily as needed (Dry eyes)., Disp: , Rfl:  .  tamsulosin (FLOMAX) 0.4 MG CAPS capsule, TAKE 1 CAPSULE BY MOUTH EVERY DAY, Disp: 30 capsule, Rfl: 11 .  Travoprost, BAK Free, (TRAVATAN Z) 0.004 % SOLN ophthalmic solution, Place 1 drop into both eyes at bedtime. , Disp: , Rfl:  .  valsartan-hydrochlorothiazide (DIOVAN HCT) 320-25 MG per tablet, Take 1 tablet by mouth daily. , Disp: , Rfl:  .  vitamin B-12 (CYANOCOBALAMIN) 1000 MCG tablet, Take 1,000 mcg by mouth daily., Disp: , Rfl:   Review of Systems  Constitutional: Positive for fatigue.   HENT: Negative.   Eyes: Negative.   Respiratory: Negative.   Cardiovascular: Negative.   Gastrointestinal: Negative.   Endocrine: Negative.   Genitourinary: Negative.   Musculoskeletal: Negative.   Skin: Negative.   Allergic/Immunologic: Negative.   Neurological: Negative.   Hematological: Negative.   Psychiatric/Behavioral: Negative.     Social History   Tobacco Use  . Smoking status: Former Smoker    Years: 15.00    Types: Cigarettes  . Smokeless tobacco: Never Used  Substance Use Topics  . Alcohol use: Yes    Comment: 1-2 glasses of wine a night   Objective:   BP (!) 120/52 (BP Location: Left Arm, Patient Position: Sitting, Cuff Size: Normal)   Pulse 60   Temp 97.8 F (36.6 C) (Oral)   Resp 16   Wt 172 lb (78 kg)   BMI 27.76 kg/m  Vitals:   09/09/17 0819  BP: (!) 120/52  Pulse: 60  Resp: 16  Temp: 97.8 F (36.6 C)  TempSrc: Oral  Weight: 172 lb (78 kg)     Physical Exam  Constitutional: He is oriented to person, place, and time. He appears well-developed and well-nourished.  HENT:  Head: Normocephalic and atraumatic.  Eyes: No scleral icterus.  Neck: No thyromegaly present.  Cardiovascular: Normal rate, regular rhythm and normal heart sounds.  Pulmonary/Chest: Effort normal and breath sounds normal.  Abdominal: Soft.  Neurological: He is alert and oriented to person, place, and time.  Skin: Skin is warm and dry.  Psychiatric: He has a normal mood and affect. His behavior is normal. Judgment and thought content normal.        Assessment & Plan:     CAD HTN HLD All controlled.  I have done the exam and reviewed the chart and it is accurate to the best of my knowledge. Development worker, community has been used and  any errors in dictation or transcription are unintentional. Miguel Aschoff M.D. [place], MD  [facility]

## 2017-09-16 ENCOUNTER — Ambulatory Visit: Payer: PPO | Attending: Pain Medicine | Admitting: Pain Medicine

## 2017-09-16 ENCOUNTER — Other Ambulatory Visit: Payer: Self-pay

## 2017-09-16 ENCOUNTER — Encounter: Payer: Self-pay | Admitting: Pain Medicine

## 2017-09-16 VITALS — BP 137/60 | HR 66 | Temp 98.4°F | Ht 66.0 in | Wt 170.0 lb

## 2017-09-16 DIAGNOSIS — M47816 Spondylosis without myelopathy or radiculopathy, lumbar region: Secondary | ICD-10-CM | POA: Diagnosis not present

## 2017-09-16 DIAGNOSIS — M4310 Spondylolisthesis, site unspecified: Secondary | ICD-10-CM

## 2017-09-16 DIAGNOSIS — M9983 Other biomechanical lesions of lumbar region: Secondary | ICD-10-CM | POA: Diagnosis not present

## 2017-09-16 DIAGNOSIS — M4726 Other spondylosis with radiculopathy, lumbar region: Secondary | ICD-10-CM | POA: Insufficient documentation

## 2017-09-16 DIAGNOSIS — M48062 Spinal stenosis, lumbar region with neurogenic claudication: Secondary | ICD-10-CM

## 2017-09-16 DIAGNOSIS — Z87891 Personal history of nicotine dependence: Secondary | ICD-10-CM | POA: Diagnosis not present

## 2017-09-16 DIAGNOSIS — Z951 Presence of aortocoronary bypass graft: Secondary | ICD-10-CM | POA: Diagnosis not present

## 2017-09-16 DIAGNOSIS — G894 Chronic pain syndrome: Secondary | ICD-10-CM | POA: Diagnosis not present

## 2017-09-16 DIAGNOSIS — Z880 Allergy status to penicillin: Secondary | ICD-10-CM | POA: Diagnosis not present

## 2017-09-16 DIAGNOSIS — Z79899 Other long term (current) drug therapy: Secondary | ICD-10-CM | POA: Diagnosis not present

## 2017-09-16 DIAGNOSIS — I252 Old myocardial infarction: Secondary | ICD-10-CM | POA: Insufficient documentation

## 2017-09-16 DIAGNOSIS — H409 Unspecified glaucoma: Secondary | ICD-10-CM | POA: Diagnosis not present

## 2017-09-16 DIAGNOSIS — M79604 Pain in right leg: Secondary | ICD-10-CM

## 2017-09-16 DIAGNOSIS — E785 Hyperlipidemia, unspecified: Secondary | ICD-10-CM | POA: Diagnosis not present

## 2017-09-16 DIAGNOSIS — M47817 Spondylosis without myelopathy or radiculopathy, lumbosacral region: Secondary | ICD-10-CM | POA: Insufficient documentation

## 2017-09-16 DIAGNOSIS — Z96651 Presence of right artificial knee joint: Secondary | ICD-10-CM | POA: Diagnosis not present

## 2017-09-16 DIAGNOSIS — H269 Unspecified cataract: Secondary | ICD-10-CM | POA: Diagnosis not present

## 2017-09-16 DIAGNOSIS — Z86010 Personal history of colonic polyps: Secondary | ICD-10-CM | POA: Insufficient documentation

## 2017-09-16 DIAGNOSIS — M545 Low back pain: Secondary | ICD-10-CM | POA: Diagnosis not present

## 2017-09-16 DIAGNOSIS — I10 Essential (primary) hypertension: Secondary | ICD-10-CM | POA: Diagnosis not present

## 2017-09-16 DIAGNOSIS — M4316 Spondylolisthesis, lumbar region: Secondary | ICD-10-CM | POA: Diagnosis not present

## 2017-09-16 DIAGNOSIS — M5116 Intervertebral disc disorders with radiculopathy, lumbar region: Secondary | ICD-10-CM | POA: Diagnosis not present

## 2017-09-16 DIAGNOSIS — R7303 Prediabetes: Secondary | ICD-10-CM | POA: Insufficient documentation

## 2017-09-16 DIAGNOSIS — Z8249 Family history of ischemic heart disease and other diseases of the circulatory system: Secondary | ICD-10-CM | POA: Insufficient documentation

## 2017-09-16 DIAGNOSIS — Z7982 Long term (current) use of aspirin: Secondary | ICD-10-CM | POA: Diagnosis not present

## 2017-09-16 DIAGNOSIS — G8929 Other chronic pain: Secondary | ICD-10-CM | POA: Diagnosis not present

## 2017-09-16 DIAGNOSIS — I739 Peripheral vascular disease, unspecified: Secondary | ICD-10-CM | POA: Diagnosis not present

## 2017-09-16 DIAGNOSIS — Z87442 Personal history of urinary calculi: Secondary | ICD-10-CM | POA: Insufficient documentation

## 2017-09-16 DIAGNOSIS — I2510 Atherosclerotic heart disease of native coronary artery without angina pectoris: Secondary | ICD-10-CM | POA: Insufficient documentation

## 2017-09-16 DIAGNOSIS — M549 Dorsalgia, unspecified: Secondary | ICD-10-CM | POA: Diagnosis present

## 2017-09-16 DIAGNOSIS — M5416 Radiculopathy, lumbar region: Secondary | ICD-10-CM | POA: Diagnosis not present

## 2017-09-16 DIAGNOSIS — K219 Gastro-esophageal reflux disease without esophagitis: Secondary | ICD-10-CM | POA: Insufficient documentation

## 2017-09-16 DIAGNOSIS — M48061 Spinal stenosis, lumbar region without neurogenic claudication: Secondary | ICD-10-CM | POA: Diagnosis not present

## 2017-09-16 NOTE — Progress Notes (Signed)
Safety precautions to be maintained throughout the outpatient stay will include: orient to surroundings, keep bed in low position, maintain call bell within reach at all times, provide assistance with transfer out of bed and ambulation.  

## 2017-09-16 NOTE — Progress Notes (Signed)
Patient's Name: Austin [name]  MRN: [id number]  Referring Provider: Jerrol Banana.,*  DOB: 02-16-1929  PCP: Jerrol Banana., MD  DOS: 09/16/2017  Note by: Gaspar Cola, MD  Service setting: Ambulatory outpatient  Specialty: Interventional Pain Management  Location: [facility] (AMB) Pain Management Facility    Patient type: Established   Primary Reason(s) for Visit: Encounter for post-procedure evaluation of chronic illness with mild to moderate exacerbation CC: Back Pain  HPI  Austin [name] is a 84 y.o. year old, male patient, who comes today for a post-procedure evaluation. He has History of colonic polyps; Benign essential HTN; Benign fibroma of prostate; Atherosclerosis of coronary artery; Cataract; Esophageal stenosis; Acid reflux; Glaucoma; Hemorrhoid; HLD (hyperlipidemia); Obstructive apnea; Borderline diabetes; Esophagogastric ring; Heart valve disease; MI (mitral incompetence); Cardiomyopathy, ischemic; Coronary artery disease involving native coronary artery of native heart with unstable angina pectoris ([place]); Chronic low back pain (Primary Source of Pain) (Bilateral) (R>L); Lumbar spondylosis; Lumbar facet syndrome (Bilateral) (R>L); Peripheral vascular disease ([place]); Lumbar facet hypertrophy (multilevel); Grade 1 Anterolisthesis of L3 over L4; Lumbar foraminal stenosis (Right L2-3; Bilateral L3-4, L4-5); Carotid stenosis; Chronic knee pain (Right); Full thickness rotator cuff tear; Stiffness of shoulder joint; Tear of medial meniscus of knee; Weakness of limb; Osteoarthritis of knee (Right); Chronic pain syndrome; Lumbar central spinal stenosis (Severe at L4-5); Chronic Lumbar radiculitis (intermittent/recurrent) (Bilateral); Easy bruising; DDD (degenerative disc disease), lumbar; Status post right partial knee replacement; Chronic lower extremity pain (Right); and Spondylosis without myelopathy or radiculopathy, lumbosacral region on their problem list. His primarily concern today  is the Back Pain  Pain Assessment: Location: Lower, Right Back Radiating: Denies  Onset: More than a month ago Duration: Chronic pain Quality: Dull Severity: 1 /10 (self-reported pain score)  Note: Reported level is compatible with observation.                         When using our objective Pain Scale, levels between 6 and 10/10 are said to belong in an emergency room, as it progressively worsens from a 6/10, described as severely limiting, requiring emergency care not usually available at an outpatient pain management facility. At a 6/10 level, communication becomes difficult and requires great effort. Assistance to reach the emergency department may be required. Facial flushing and profuse sweating along with potentially dangerous increases in heart rate and blood pressure will be evident. Effect on ADL: Pain is worse in the morning. Painful if sitting too long  Timing: Constant Modifying factors: Tylenol, Aleve, heat, movement    Austin [name] comes in today for post-procedure evaluation after the treatment done on 08/26/2017.  The patient seems to be doing better after the treatment with the lumbar epidural steroid injections.  However, he has not been able to resume his golf game.  In the past used to get excellent relief from the epidurals where he would be able to go back to his favorite activities.  However, he is not being able to do that at this time.  This would suggest that his problems in the lumbar region have progressed.  In reviewing the information that we have, there is an MRI that was done on 2011 and this seems to be the last one done.  Since then, his condition has progressed and I believe it is medically necessary for [place] to go ahead and have that MRI repeated and perhaps compare that to MRIs, side-by-side in order to be get a better idea  as to what is going on.  I will be ordering this MRI today and I will see him back after that MRI is completed.  Further details on both, my  assessment(s), as well as the proposed treatment plan, please see below.  Post-Procedure Assessment  08/26/2017 Procedure: Therapeutic/palliative right-sided L3-4 interlaminar lumbar epidural steroid injection #3 under fluoroscopic guidance, no sedation Pre-procedure pain score:  7/10 Post-procedure pain score: 0/10 (100% relief) Influential Factors: BMI: 27.44 kg/m Intra-procedural challenges: None observed.         Assessment challenges: None detected.              Reported side-effects: None.        Post-procedural adverse reactions or complications: None reported         Sedation: No sedation used. When no sedatives are used, the analgesic levels obtained are directly associated to the effectiveness of the local anesthetics. However, when sedation is provided, the level of analgesia obtained during the initial 1 hour following the intervention, is believed to be the result of a combination of factors. These factors may include, but are not limited to: 1. The effectiveness of the local anesthetics used. 2. The effects of the analgesic(s) and/or anxiolytic(s) used. 3. The degree of discomfort experienced by the patient at the time of the procedure. 4. The patients ability and reliability in recalling and recording the events. 5. The presence and influence of possible secondary gains and/or psychosocial factors. Reported result: Relief experienced during the 1st hour after the procedure: 100 % (Ultra-Short Term Relief) Austin [name] has indicated area to have been numb during this time. Interpretative annotation: Clinically appropriate result. No IV Analgesic or Anxiolytic given, therefore benefits are completely due to Local Anesthetic effects.          Effects of local anesthetic: The analgesic effects attained during this period are directly associated to the localized infiltration of local anesthetics and therefore cary significant diagnostic value as to the etiological location, or  anatomical origin, of the pain. Expected duration of relief is directly dependent on the pharmacodynamics of the local anesthetic used. Long-acting (4-6 hours) anesthetics used.  Reported result: Relief during the next 4 to 6 hour after the procedure: 80 % (Short-Term Relief)            Interpretative annotation: Clinically appropriate result. Analgesia during this period is likely to be Local Anesthetic-related.          Long-term benefit: Defined as the period of time past the expected duration of local anesthetics (1 hour for short-acting and 4-6 hours for long-acting). With the possible exception of prolonged sympathetic blockade from the local anesthetics, benefits during this period are typically attributed to, or associated with, other factors such as analgesic sensory neuropraxia, antiinflammatory effects, or beneficial biochemical changes provided by agents other than the local anesthetics.  Reported result: Extended relief following procedure: 90 % (Long-Term Relief)            Interpretative annotation: Clinically appropriate result. Good relief. Therapeutic success. Inflammation plays a part in the etiology to the pain. Benefit believed to be steroid-related.  Current benefits: Defined as reported results that persistent at this point in time.   Analgesia: 75-100 % Mr. Lingenfelter reports that both, extremity and the axial pain improved with the treatment. Function: Mr. Meuser reports improvement in function ROM: Mr. Cheramie reports improvement in ROM Interpretative annotation: Ongoing benefit. Therapeutic benefit observed. Effective therapeutic approach.          Interpretation:  Results would suggest a successful diagnostic intervention.                  Plan:  Set up procedure as a PRN palliative treatment option for this patient.                Laboratory Chemistry  Inflammation Markers (CRP: Acute Phase) (ESR: Chronic Phase) No results found.  Rheumatology Markers No results found  .  Renal Function Markers Lab Results  Component Value Date   BUN 17 03/05/2017   CREATININE 1.50 (H) 03/05/2017   GFRAA 47 (L) 07/28/2016   GFRNONAA 40 (L) 07/28/2016                              Hepatic Function Markers Lab Results  Component Value Date   AST 18 03/05/2017   ALT 17 03/05/2017   ALBUMIN 4.4 02/27/2016   ALKPHOS 81 02/27/2016                        Electrolytes Lab Results  Component Value Date   NA 137 03/05/2017   K 4.1 03/05/2017   CL 102 03/05/2017   CALCIUM 9.4 03/05/2017                        Neuropathy Markers Lab Results  Component Value Date   HGBA1C 5.7 09/29/2016                        Bone Pathology Markers No results found.  Coagulation Parameters Lab Results  Component Value Date   INR 1.03 07/28/2016   LABPROT 13.5 07/28/2016   APTT 41 (H) 07/28/2016   PLT 203 03/05/2017                        Cardiovascular Markers Lab Results  Component Value Date   CKTOTAL 142 01/08/2015   HGB 14.1 03/05/2017   HCT 41.4 03/05/2017                         Note: Lab results reviewed.  Recent Diagnostic Imaging Results  DG C-Arm 1-60 Min-No Report Fluoroscopy was utilized by the requesting physician.  No radiographic  interpretation.   Complexity Note: I personally reviewed the fluoroscopic imaging of the procedure.                        Meds   Current Outpatient Medications:  .  amLODipine (NORVASC) 5 MG tablet, Take 5 mg by mouth daily., Disp: , Rfl:  .  aspirin EC 81 MG tablet, Take 81 mg by mouth daily., Disp: , Rfl:  .  atorvastatin (LIPITOR) 20 MG tablet, Take 20 mg by mouth every other day. , Disp: , Rfl:  .  Biotin 5000 MCG CAPS, Take 5,000 mcg by mouth daily. , Disp: , Rfl:  .  Cholecalciferol (VITAMIN D3) 2000 UNITS capsule, Take 2,000 Units by mouth 2 (two) times daily. , Disp: , Rfl:  .  dorzolamide-timolol (COSOPT) 22.3-6.8 MG/ML ophthalmic solution, Place 1 drop into both eyes 2 (two) times daily. , Disp: , Rfl:   .  glucosamine-chondroitin 500-400 MG tablet, Take 1 tablet by mouth 2 (two) times daily., Disp: , Rfl:  .  isosorbide mononitrate (IMDUR) 60 MG 24 hr tablet, Take 60 mg  by mouth daily. , Disp: , Rfl:  .  nitroGLYCERIN (NITROSTAT) 0.4 MG SL tablet, Place 0.4 mg under the tongue every 5 (five) minutes as needed for chest pain. , Disp: , Rfl:  .  OMEGA-3 FATTY ACIDS PO, Take 1,200 mcg by mouth 2 (two) times daily. , Disp: , Rfl:  .  omeprazole (PRILOSEC) 20 MG capsule, Take 20 mg by mouth daily. Reported on 08/21/2015, Disp: , Rfl:  .  tamsulosin (FLOMAX) 0.4 MG CAPS capsule, TAKE 1 CAPSULE BY MOUTH EVERY DAY, Disp: 30 capsule, Rfl: 11 .  Travoprost, BAK Free, (TRAVATAN Z) 0.004 % SOLN ophthalmic solution, Place 1 drop into both eyes at bedtime. , Disp: , Rfl:  .  valsartan-hydrochlorothiazide (DIOVAN HCT) 320-25 MG per tablet, Take 1 tablet by mouth daily. , Disp: , Rfl:  .  vitamin B-12 (CYANOCOBALAMIN) 1000 MCG tablet, Take 1,000 mcg by mouth daily., Disp: , Rfl:   ROS  Constitutional: Denies any fever or chills Gastrointestinal: No reported hemesis, hematochezia, vomiting, or acute GI distress Musculoskeletal: Denies any acute onset joint swelling, redness, loss of ROM, or weakness Neurological: No reported episodes of acute onset apraxia, aphasia, dysarthria, agnosia, amnesia, paralysis, loss of coordination, or loss of consciousness  Allergies  Mr. Costilla is allergic to penicillins.  PFSH  Drug: Mr. Ludtke  reports that he does not use drugs. Alcohol:  reports that he drinks alcohol. Tobacco:  reports that he has quit smoking. His smoking use included cigarettes. He quit after 15.00 years of use. He has never used smokeless tobacco. Medical:  has a past medical history of Absolute anemia (12/08/2014), Allergy, Arthritis, Bradycardia (05/03/2015), Calculus of kidney (12/08/2014), Coronary artery disease, GERD (gastroesophageal reflux disease), History of kidney stones, HLD (hyperlipidemia),  Hyperglycemia (08/27/2015), Hypertension, Myocardial infarction ([place]) (2011), Peripheral vascular disease ([place]), and VHD (valvular heart disease). Surgical: Mr. Westrup  has a past surgical history that includes Shoulder surgery (Right); Coronary artery bypass graft (2007); Colonoscopy (N/A, 10/02/2014); Tonsillectomy; Hemorroidectomy; Carpal tunnel release (Bilateral); Lumbar epidural injection (2011); Mouth surgery; Cardiac catheterization (2007); Cardiac catheterization (N/A, 04/17/2015); Knee arthroscopy (Right, 08/05/2016); Knee arthroscopy with medial menisectomy (Right, 08/05/2016); Synovectomy (Right, 08/05/2016); Knee arthroscopy with lateral release (Right, 08/05/2016); and Joint replacement (Right, 03/13/2018). Family: family history includes Heart disease in his brother, brother, brother, brother, brother, father, and mother.  Constitutional Exam  General appearance: Well nourished, well developed, and well hydrated. In no apparent acute distress Vitals:   09/16/17 1410  BP: 137/60  Pulse: 66  Temp: 98.4 F (36.9 C)  SpO2: 100%  Weight: 170 lb (77.1 kg)  Height: _0  (1.676 m)   BMI Assessment: Estimated body mass index is 27.44 kg/m as calculated from the following:   Height as of this encounter: _1  (1.676 m).   Weight as of this encounter: 170 lb (77.1 kg).  BMI interpretation table: BMI level Category Range association with higher incidence of chronic pain  <18 kg/m2 Underweight   18.5-24.9 kg/m2 Ideal body weight   25-29.9 kg/m2 Overweight Increased incidence by 20%  30-34.9 kg/m2 Obese (Class I) Increased incidence by 68%  35-39.9 kg/m2 Severe obesity (Class II) Increased incidence by 136%  >40 kg/m2 Extreme obesity (Class III) Increased incidence by 254%   BMI Readings from Last 4 Encounters:  09/16/17 27.44 kg/m  09/09/17 27.76 kg/m  08/13/17 27.44 kg/m  03/05/17 27.25 kg/m   Wt Readings from Last 4 Encounters:  09/16/17 170 lb (77.1 kg)  09/09/17 172 lb (78 kg)  08/13/17 170 lb (77.1 kg)  03/05/17 174 lb (78.9 kg)  Psych/Mental status: Alert, oriented x 3 (person, place, & time)       Eyes: PERLA Respiratory: No evidence of acute respiratory distress  Cervical Spine Area Exam  Skin & Axial Inspection: No masses, redness, edema, swelling, or associated skin lesions Alignment: Symmetrical Functional ROM: Unrestricted ROM      Stability: No instability detected Muscle Tone/Strength: Functionally intact. No obvious neuro-muscular anomalies detected. Sensory (Neurological): Unimpaired Palpation: No palpable anomalies              Upper Extremity (UE) Exam    Side: Right upper extremity  Side: Left upper extremity  Skin & Extremity Inspection: Skin color, temperature, and hair growth are WNL. No peripheral edema or cyanosis. No masses, redness, swelling, asymmetry, or associated skin lesions. No contractures.  Skin & Extremity Inspection: Skin color, temperature, and hair growth are WNL. No peripheral edema or cyanosis. No masses, redness, swelling, asymmetry, or associated skin lesions. No contractures.  Functional ROM: Unrestricted ROM          Functional ROM: Unrestricted ROM          Muscle Tone/Strength: Functionally intact. No obvious neuro-muscular anomalies detected.  Muscle Tone/Strength: Functionally intact. No obvious neuro-muscular anomalies detected.  Sensory (Neurological): Unimpaired          Sensory (Neurological): Unimpaired          Palpation: No palpable anomalies              Palpation: No palpable anomalies              Specialized Test(s): Deferred         Specialized Test(s): Deferred          Thoracic Spine Area Exam  Skin & Axial Inspection: No masses, redness, or swelling Alignment: Symmetrical Functional ROM: Unrestricted ROM Stability: No instability detected Muscle Tone/Strength: Functionally intact. No obvious neuro-muscular anomalies detected. Sensory (Neurological): Unimpaired Muscle strength & Tone: No palpable  anomalies  Lumbar Spine Area Exam  Skin & Axial Inspection: No masses, redness, or swelling Alignment: Symmetrical Functional ROM: Restricted ROM       Stability: No instability detected Muscle Tone/Strength: Functionally intact. No obvious neuro-muscular anomalies detected. Sensory (Neurological): Movement-associated discomfort Palpation: Tender       Provocative Tests: Lumbar Hyperextension and rotation test: evaluation deferred today       Lumbar Lateral bending test: evaluation deferred today       Patrick's Maneuver: evaluation deferred today                    Gait & Posture Assessment  Ambulation: Limited Gait: Antalgic Posture: Antalgic   Lower Extremity Exam    Side: Right lower extremity  Side: Left lower extremity  Skin & Extremity Inspection: Skin color, temperature, and hair growth are WNL. No peripheral edema or cyanosis. No masses, redness, swelling, asymmetry, or associated skin lesions. No contractures.  Skin & Extremity Inspection: Skin color, temperature, and hair growth are WNL. No peripheral edema or cyanosis. No masses, redness, swelling, asymmetry, or associated skin lesions. No contractures.  Functional ROM: Unrestricted ROM          Functional ROM: Unrestricted ROM          Muscle Tone/Strength: Functionally intact. No obvious neuro-muscular anomalies detected.  Muscle Tone/Strength: Functionally intact. No obvious neuro-muscular anomalies detected.  Sensory (Neurological): Unimpaired  Sensory (Neurological): Unimpaired  Palpation: No palpable anomalies  Palpation: No palpable  anomalies   Assessment  Primary Diagnosis & Pertinent Problem List: The primary encounter diagnosis was Chronic low back pain (Primary Source of Pain) (Bilateral) (R>L). Diagnoses of Chronic lower extremity pain (Right), Chronic Lumbar radiculitis (intermittent/recurrent) (Bilateral), Lumbar central spinal stenosis (Severe at L4-5), Grade 1 Anterolisthesis of L3 over L4, Lumbar foraminal  stenosis (Right L2-3; Bilateral L3-4, L4-5), and Lumbar facet hypertrophy (multilevel) were also pertinent to this visit.  Status Diagnosis  Improved Improved Improved 1. Chronic low back pain (Primary Source of Pain) (Bilateral) (R>L)   2. Chronic lower extremity pain (Right)   3. Chronic Lumbar radiculitis (intermittent/recurrent) (Bilateral)   4. Lumbar central spinal stenosis (Severe at L4-5)   5. Grade 1 Anterolisthesis of L3 over L4   6. Lumbar foraminal stenosis (Right L2-3; Bilateral L3-4, L4-5)   7. Lumbar facet hypertrophy (multilevel)     Problems updated and reviewed during this visit: Problem  Easy Bruising   Plan of Care  Pharmacotherapy (Medications Ordered): No orders of the defined types were placed in this encounter.  Medications administered today: Ira B. Pulver "Mikki Santee" had no medications administered during this visit.  Procedure Orders    No procedure(s) ordered today   Lab Orders  No laboratory test(s) ordered today    Imaging Orders     MR LUMBAR SPINE WO CONTRAST Referral Orders  No referral(s) requested today    Interventional management options: Planned, scheduled, and/or pending:   Repeat MRI of the lumbar spine.  Last MRI done around 2011.   Considering:   Palliative right-sided L3-4 LESI #4 Palliative right-sided L4-5 LESI #2 Palliative bilateral lumbar facet block #3.  Diagnostic right-sided L2-3 transforaminal ESI + bilateral L3-4 and/or L4-5transforaminal ESI   Palliative PRN treatment(s):   Palliative right-sided L3-4 LESI #4 Palliative right-sided L4-5 LESI #2 Palliative bilateral lumbar facet block #3.   Provider-requested follow-up: Return for F/U eval (after test completion).  Future Appointments  Date Time Provider [place]  09/18/2017  7:00 PM [facility]  10/07/2017  9:30 AM Milinda Pointer, MD [facility] None  11/06/2017  8:30 AM [facility] 2 [facility] None  11/06/2017  9:30 AM Dew, Erskine Squibb, MD  [facility] None  03/15/2018  8:20 AM Jerrol Banana., MD [facility] None   Primary Care Physician: Jerrol Banana., MD Location: [facility] Note by: Gaspar Cola, MD Date: 09/16/2017; Time: 3:30 PM

## 2017-09-18 ENCOUNTER — Ambulatory Visit
Admission: RE | Admit: 2017-09-18 | Discharge: 2017-09-18 | Disposition: A | Discharge status: Home / Self Care | Payer: PPO | Source: Ambulatory Visit | Attending: Pain Medicine | Admitting: Pain Medicine

## 2017-09-18 DIAGNOSIS — M4726 Other spondylosis with radiculopathy, lumbar region: Secondary | ICD-10-CM | POA: Insufficient documentation

## 2017-09-18 DIAGNOSIS — M5416 Radiculopathy, lumbar region: Secondary | ICD-10-CM

## 2017-09-18 DIAGNOSIS — M48061 Spinal stenosis, lumbar region without neurogenic claudication: Secondary | ICD-10-CM

## 2017-09-18 DIAGNOSIS — G8929 Other chronic pain: Secondary | ICD-10-CM | POA: Insufficient documentation

## 2017-09-18 DIAGNOSIS — M545 Low back pain: Secondary | ICD-10-CM | POA: Diagnosis not present

## 2017-09-18 DIAGNOSIS — M48062 Spinal stenosis, lumbar region with neurogenic claudication: Secondary | ICD-10-CM | POA: Insufficient documentation

## 2017-09-18 DIAGNOSIS — M4310 Spondylolisthesis, site unspecified: Secondary | ICD-10-CM

## 2017-09-18 DIAGNOSIS — M79604 Pain in right leg: Secondary | ICD-10-CM | POA: Insufficient documentation

## 2017-10-07 ENCOUNTER — Encounter: Payer: Self-pay | Admitting: Pain Medicine

## 2017-10-07 ENCOUNTER — Other Ambulatory Visit: Payer: Self-pay

## 2017-10-07 ENCOUNTER — Ambulatory Visit: Payer: PPO | Attending: Pain Medicine | Admitting: Pain Medicine

## 2017-10-07 VITALS — BP 146/64 | HR 60 | Temp 98.2°F | Ht 66.0 in | Wt 170.0 lb

## 2017-10-07 DIAGNOSIS — G8929 Other chronic pain: Secondary | ICD-10-CM

## 2017-10-07 DIAGNOSIS — I739 Peripheral vascular disease, unspecified: Secondary | ICD-10-CM | POA: Diagnosis not present

## 2017-10-07 DIAGNOSIS — M79604 Pain in right leg: Secondary | ICD-10-CM

## 2017-10-07 DIAGNOSIS — M5116 Intervertebral disc disorders with radiculopathy, lumbar region: Secondary | ICD-10-CM | POA: Insufficient documentation

## 2017-10-07 DIAGNOSIS — I10 Essential (primary) hypertension: Secondary | ICD-10-CM | POA: Diagnosis not present

## 2017-10-07 DIAGNOSIS — Z96651 Presence of right artificial knee joint: Secondary | ICD-10-CM | POA: Diagnosis not present

## 2017-10-07 DIAGNOSIS — Z87442 Personal history of urinary calculi: Secondary | ICD-10-CM | POA: Diagnosis not present

## 2017-10-07 DIAGNOSIS — Z951 Presence of aortocoronary bypass graft: Secondary | ICD-10-CM | POA: Diagnosis not present

## 2017-10-07 DIAGNOSIS — Z7982 Long term (current) use of aspirin: Secondary | ICD-10-CM | POA: Diagnosis not present

## 2017-10-07 DIAGNOSIS — I252 Old myocardial infarction: Secondary | ICD-10-CM | POA: Insufficient documentation

## 2017-10-07 DIAGNOSIS — E785 Hyperlipidemia, unspecified: Secondary | ICD-10-CM | POA: Diagnosis not present

## 2017-10-07 DIAGNOSIS — H409 Unspecified glaucoma: Secondary | ICD-10-CM | POA: Diagnosis not present

## 2017-10-07 DIAGNOSIS — M4726 Other spondylosis with radiculopathy, lumbar region: Secondary | ICD-10-CM | POA: Diagnosis not present

## 2017-10-07 DIAGNOSIS — Z79899 Other long term (current) drug therapy: Secondary | ICD-10-CM | POA: Insufficient documentation

## 2017-10-07 DIAGNOSIS — M545 Low back pain, unspecified: Secondary | ICD-10-CM

## 2017-10-07 DIAGNOSIS — M48062 Spinal stenosis, lumbar region with neurogenic claudication: Secondary | ICD-10-CM | POA: Diagnosis not present

## 2017-10-07 DIAGNOSIS — G894 Chronic pain syndrome: Secondary | ICD-10-CM | POA: Diagnosis not present

## 2017-10-07 DIAGNOSIS — G4733 Obstructive sleep apnea (adult) (pediatric): Secondary | ICD-10-CM | POA: Insufficient documentation

## 2017-10-07 DIAGNOSIS — I25110 Atherosclerotic heart disease of native coronary artery with unstable angina pectoris: Secondary | ICD-10-CM | POA: Insufficient documentation

## 2017-10-07 DIAGNOSIS — Z87891 Personal history of nicotine dependence: Secondary | ICD-10-CM | POA: Diagnosis not present

## 2017-10-07 DIAGNOSIS — M5416 Radiculopathy, lumbar region: Secondary | ICD-10-CM | POA: Diagnosis not present

## 2017-10-07 DIAGNOSIS — K219 Gastro-esophageal reflux disease without esophagitis: Secondary | ICD-10-CM | POA: Insufficient documentation

## 2017-10-07 NOTE — Progress Notes (Addendum)
Patient's Name: Austin [name]  MRN: [id number]  Referring Provider: Jerrol Banana.,*  DOB: [birth year]  PCP: Jerrol Banana., MD  DOS: 10/07/2017  Note by: Gaspar Cola, MD  Service setting: Ambulatory outpatient  Specialty: Interventional Pain Management  Location: [facility] (AMB) Pain Management Facility    Patient type: Established   Primary Reason(s) for Visit: Evaluation of chronic illnesses with exacerbation, or progression (Level of risk: moderate) CC: Back Pain (lower)  HPI  Austin [name] is a 84 y.o. year old, male patient, who comes today for a follow-up evaluation. He has History of colonic polyps; Benign essential HTN; Benign fibroma of prostate; Atherosclerosis of coronary artery; Cataract; Esophageal stenosis; Acid reflux; Glaucoma; Hemorrhoid; HLD (hyperlipidemia); Obstructive apnea; Borderline diabetes; Esophagogastric ring; Heart valve disease; MI (mitral incompetence); Cardiomyopathy, ischemic; Coronary artery disease involving native coronary artery of native heart with unstable angina pectoris ([place]); Chronic low back pain (Primary Source of Pain) (Bilateral) (R>L); Lumbar spondylosis; Lumbar facet syndrome (Bilateral) (R>L); Peripheral vascular disease ([place]); Lumbar facet hypertrophy (multilevel); Grade 1 Anterolisthesis of L3 over L4; Lumbar foraminal stenosis (Right L2-3; Bilateral L3-4, L4-5); Carotid stenosis; Chronic knee pain (Right); Full thickness rotator cuff tear; Stiffness of shoulder joint; Tear of medial meniscus of knee; Weakness of limb; Osteoarthritis of knee (Right); Chronic pain syndrome; Lumbar central spinal stenosis (Severe at L4-5); Chronic Lumbar radiculitis (intermittent/recurrent) (Bilateral); Easy bruising; DDD (degenerative disc disease), lumbar; Status post right partial knee replacement; Chronic lower extremity pain (Right); Spondylosis without myelopathy or radiculopathy, lumbosacral region; and Spinal stenosis, lumbar region, with neurogenic  claudication on their problem list. Austin [name] was last seen on 09/16/2017. His primarily concern today is the Back Pain (lower)  Pain Assessment: Location: Lower, Right Back Radiating: radiates to buttock area Onset: More than a month ago Duration: Chronic pain Quality: Constant, Sharp Severity: 2 /10 (subjective, self-reported pain score)  Note: Reported level is compatible with observation.                         When using our objective Pain Scale, levels between 6 and 10/10 are said to belong in an emergency room, as it progressively worsens from a 6/10, described as severely limiting, requiring emergency care not usually available at an outpatient pain management facility. At a 6/10 level, communication becomes difficult and requires great effort. Assistance to reach the emergency department may be required. Facial flushing and profuse sweating along with potentially dangerous increases in heart rate and blood pressure will be evident. Effect on ADL: prolonged sitting Timing: Constant Modifying factors: Aleve in the morning, tylenol in the evening BP: (!) 146/64  HR: 60  Further details on both, my assessment(s), as well as the proposed treatment plan, please see below.  Laboratory Chemistry  Inflammation Markers (CRP: Acute Phase) (ESR: Chronic Phase) No results found for: CRP, ESRSEDRATE, LATICACIDVEN                       Rheumatology Markers No results found for: RF, ANA, LABURIC, URICUR, LYMEIGGIGMAB, [facility]                      Renal Function Markers Lab Results  Component Value Date   BUN 17 03/05/2017   CREATININE 1.50 (H) 03/05/2017   GFRAA 47 (L) 07/28/2016   GFRNONAA 40 (L) 07/28/2016  Hepatic Function Markers Lab Results  Component Value Date   AST 18 03/05/2017   ALT 17 03/05/2017   ALBUMIN 4.4 02/27/2016   ALKPHOS 81 02/27/2016                        Electrolytes Lab Results  Component Value Date   NA 137 03/05/2017    K 4.1 03/05/2017   CL 102 03/05/2017   CALCIUM 9.4 03/05/2017                        Neuropathy Markers Lab Results  Component Value Date   HGBA1C 5.7 09/29/2016                        Bone Pathology Markers No results found for: VD25OH, [id number], OY7741OI7, OM7672CN4, 25OHVITD1, 25OHVITD2, 25OHVITD3, TESTOFREE, TESTOSTERONE                       Coagulation Parameters Lab Results  Component Value Date   INR 1.03 07/28/2016   LABPROT 13.5 07/28/2016   APTT 41 (H) 07/28/2016   PLT 203 03/05/2017                        Cardiovascular Markers Lab Results  Component Value Date   CKTOTAL 142 01/08/2015   HGB 14.1 03/05/2017   HCT 41.4 03/05/2017                         CA Markers No results found for: CEA, CA125, LABCA2                      Note: Lab results reviewed.  Recent Diagnostic Imaging Review  Lumbosacral Imaging: Lumbar MR wo contrast:  Results for orders placed during the hospital encounter of 09/18/17  MR LUMBAR SPINE WO CONTRAST   Narrative CLINICAL DATA:  Initial evaluation for chronic low back pain radiating into the buttocks bilaterally, worse on the right.  EXAM: MRI LUMBAR SPINE WITHOUT CONTRAST  TECHNIQUE: Multiplanar, multisequence MR imaging of the lumbar spine was performed. No intravenous contrast was administered.  COMPARISON:  Prior MRI from 12/15/2009.  FINDINGS: Segmentation: Normal segmentation. Lowest well-formed disc labeled the L5-S1 level.  Alignment: 3 mm anterolisthesis of L3 on L4. Vertebral bodies otherwise normally aligned with preservation of the normal lumbar lordosis.  Vertebrae: Vertebral body heights maintained without evidence for acute or chronic fracture. Chronic reactive endplate changes with few scattered endplate Schmorl's nodes present at L2-3 through L5-S1, progressed from previous exam. Underlying bone marrow signal intensity within normal limits. 14 mm stir hyperintense lesion positioned within the  central aspect of the L2 vertebral body is stable from previous exam, most consistent with a probable atypical hemangioma. No worrisome osseous lesions identified.  Conus medullaris and cauda equina: Conus extends to the L1 level. Conus and cauda equina appear normal.  Paraspinal and other soft tissues: Paraspinous soft tissues within normal limits. Few scattered T2 hyperintense cyst noted within the kidneys bilaterally, largest of which is exophytic in nature and measures 3.1 cm, extending from the posterior left kidney. Visualized visceral structures otherwise unremarkable.  Disc levels:  T12-L1: Normal interspace. Mild bilateral facet hypertrophy. No significant canal or foraminal stenosis.  L1-2: Mild diffuse disc bulge. Mild-to-moderate facet and ligament flavum hypertrophy. No significant canal or foraminal stenosis.  L2-3: Diffuse disc bulge  with intervertebral disc space narrowing and disc desiccation. Milder reactive endplate changes with marginal endplate osteophytic spurring, most notable posteriorly on the right. Moderate facet and ligament flavum hypertrophy. Resultant fairly severe canal and bilateral subarticular stenosis. Thecal sac measures 6 mm in AP diameter. Mild left with mild to moderate right L2 foraminal stenosis.  L3-4: Anterolisthesis. Chronic diffuse disc bulge with disc desiccation and intervertebral disc space narrowing. Associated chronic reactive endplate changes. Moderate to severe bilateral facet hypertrophy. There is resultant severe canal with bilateral subarticular stenosis. Thecal sac measures 6 mm in AP diameter. Moderate left with moderate to severe right L3 foraminal stenosis.  L4-5: Chronic intervertebral disc space narrowing with diffuse disc bulge, disc desiccation, and reactive endplate changes. Advanced facet and ligament flavum hypertrophy. There is resultant severe canal and subarticular stenosis, with the thecal sac  measuring approximately 5 mm in AP diameter. Moderate to severe left with mild right L4 foraminal stenosis.  L5-S1: Chronic intervertebral disc space narrowing with diffuse disc bulge and disc desiccation. Mild reactive endplate changes with marginal endplate osteophytic spurring. Mild bilateral facet ligament flavum hypertrophy. Resultant mild bilateral lateral recess narrowing, slightly greater on the right. Moderate bilateral L5 foraminal stenosis.  IMPRESSION: 1. Degenerative disc bulge with facet hypertrophy at L2-3 through L4-5 with resultant severe diffuse spinal stenosis, most severe at the L4-5 level. 2. Multifactorial degenerative changes with resultant multilevel foraminal narrowing as above. Notable findings include moderate right L2 foraminal narrowing, moderate left with moderate to severe right L3 foraminal stenosis, moderate to severe left L4 foraminal narrowing, and moderate bilateral L5 foraminal stenosis.   Electronically Signed   By: Jeannine Boga M.D.   On: 09/18/2017 22:17    Lumbar DG 2-3 views:  Results for orders placed during the hospital encounter of 04/30/15  DG Lumbar Spine 2-3 Views   Narrative CLINICAL DATA:  3-4 year history of low back pain with worsening symptoms over the past 3 months without known injury, no radicular symptoms.  EXAM: LUMBAR SPINE - 2-3 VIEW  COMPARISON:  AP view of the abdomen of April 02, 2010. Report of an MRI of the lumbar spine dated December 15, 2009.  FINDINGS: The lumbar vertebral bodies are preserved in height. There is moderate disc space narrowing at L4-5 and L5-S1 with milder narrowing at L2-3 and at L3-4. There is no spondylolisthesis. There are moderate-sized anterior endplate osteophytes at all lumbar levels. There is a small posterior osteophyte inferiorly at L4. There is facet joint hypertrophy at L4-5 and at L5-S1. The pedicles and transverse processes are intact. The observed portions of  the sacrum are intact.  IMPRESSION: There is moderate multilevel degenerative disc space loss centered from L3 inferiorly. There is facet joint hypertrophy at L4-5 and L5-S1. There is no compression fracture.   Electronically Signed   By: David  Martinique M.D.   On: 04/30/2015 10:10    Knee Imaging: Knee-R MR wo contrast:  Results for orders placed during the hospital encounter of 06/12/16  MR KNEE RIGHT WO CONTRAST   Narrative CLINICAL DATA:  Acute onset sharp right knee pain 3 weeks ago with swelling. Pain is worse with weight-bearing. No known injury.  EXAM: MRI OF THE RIGHT KNEE WITHOUT CONTRAST  TECHNIQUE: Multiplanar, multisequence MR imaging of the knee was performed. No intravenous contrast was administered.  COMPARISON:  None.  FINDINGS: MENISCI  Medial meniscus: Complex tear seen in the posterior horn of the medial meniscus extending into the meniscal body in a horizontal orientation reaching the  femoral articular surface. No displaced fragment.  Lateral meniscus:  Degenerative signal without tear is seen.  LIGAMENTS  Cruciates:  Intact.  Collaterals:  Intact.  CARTILAGE  Patellofemoral:  Normal.  Medial: Degenerated with a few subchondral cysts seen in the medial femoral condyle.  Lateral:  Normal.  Joint:  Trace effusion.  Popliteal Fossa: Baker's cyst measures 2.6 cm transverse by 0.8 cm AP by 4.6 cm craniocaudal.  Extensor Mechanism: Intact. There is mild edema in the superior fibers of the patellar tendon. Edema is also seen in the distal fibers the tendon at their attachment to the tibial tuberosity. The tibial tuberosity is fragmented and mildly edematous.  Bones:  No fracture or worrisome lesion.  Other: None.  IMPRESSION: Complex tear posterior horn medial meniscus extends in the meniscal body in a horizontal orientation reaching the femoral articular surface.  Tendinopathy superior fibers the patellar tendon  and Osgood-Schlatter disease.  Moderate medial compartment osteoarthritis.  Small Baker's cyst.   Electronically Signed   By: Inge Rise M.D.   On: 06/12/2016 09:11    Complexity Note: Imaging results reviewed. Results shared with Austin [name], using Layman's terms.                         Meds   Current Outpatient Medications:  .  amLODipine (NORVASC) 5 MG tablet, Take 5 mg by mouth daily., Disp: , Rfl:  .  aspirin EC 81 MG tablet, Take 81 mg by mouth daily., Disp: , Rfl:  .  atorvastatin (LIPITOR) 20 MG tablet, Take 20 mg by mouth every other day. , Disp: , Rfl:  .  Biotin 5000 MCG CAPS, Take 5,000 mcg by mouth daily. , Disp: , Rfl:  .  Cholecalciferol (VITAMIN D3) 2000 UNITS capsule, Take 2,000 Units by mouth 2 (two) times daily. , Disp: , Rfl:  .  dorzolamide-timolol (COSOPT) 22.3-6.8 MG/ML ophthalmic solution, Place 1 drop into both eyes 2 (two) times daily. , Disp: , Rfl:  .  glucosamine-chondroitin 500-400 MG tablet, Take 1 tablet by mouth 2 (two) times daily., Disp: , Rfl:  .  isosorbide mononitrate (IMDUR) 60 MG 24 hr tablet, Take 60 mg by mouth daily. , Disp: , Rfl:  .  nitroGLYCERIN (NITROSTAT) 0.4 MG SL tablet, Place 0.4 mg under the tongue every 5 (five) minutes as needed for chest pain. , Disp: , Rfl:  .  OMEGA-3 FATTY ACIDS PO, Take 1,200 mcg by mouth 2 (two) times daily. , Disp: , Rfl:  .  omeprazole (PRILOSEC) 20 MG capsule, Take 20 mg by mouth daily. Reported on 08/21/2015, Disp: , Rfl:  .  tamsulosin (FLOMAX) 0.4 MG CAPS capsule, TAKE 1 CAPSULE BY MOUTH EVERY DAY, Disp: 30 capsule, Rfl: 11 .  Travoprost, BAK Free, (TRAVATAN Z) 0.004 % SOLN ophthalmic solution, Place 1 drop into both eyes at bedtime. , Disp: , Rfl:  .  valsartan-hydrochlorothiazide (DIOVAN HCT) 320-25 MG per tablet, Take 1 tablet by mouth daily. , Disp: , Rfl:  .  vitamin B-12 (CYANOCOBALAMIN) 1000 MCG tablet, Take 1,000 mcg by mouth daily., Disp: , Rfl:   ROS  Constitutional: Denies any fever  or chills Gastrointestinal: No reported hemesis, hematochezia, vomiting, or acute GI distress Musculoskeletal: Denies any acute onset joint swelling, redness, loss of ROM, or weakness Neurological: No reported episodes of acute onset apraxia, aphasia, dysarthria, agnosia, amnesia, paralysis, loss of coordination, or loss of consciousness  Allergies  Austin [name] is allergic to penicillins.  Pana  Drug: Austin [name]  reports that he does not use drugs. Alcohol:  reports that he drinks alcohol. Tobacco:  reports that he has quit smoking. His smoking use included cigarettes. He quit after 15.00 years of use. He has never used smokeless tobacco. Medical:  has a past medical history of Absolute anemia (12/08/2014), Allergy, Arthritis, Bradycardia (05/03/2015), Calculus of kidney (12/08/2014), Coronary artery disease, GERD (gastroesophageal reflux disease), History of kidney stones, HLD (hyperlipidemia), Hyperglycemia (08/27/2015), Hypertension, Myocardial infarction ([place]) (2011), Peripheral vascular disease ([place]), and VHD (valvular heart disease). Surgical: Austin [name]  has a past surgical history that includes Shoulder surgery (Right); Coronary artery bypass graft (2007); Colonoscopy (N/A, 10/02/2014); Tonsillectomy; Hemorroidectomy; Carpal tunnel release (Bilateral); Lumbar epidural injection (2011); Mouth surgery; Cardiac catheterization (2007); Cardiac catheterization (N/A, 04/17/2015); Knee arthroscopy (Right, 08/05/2016); Knee arthroscopy with medial menisectomy (Right, 08/05/2016); Synovectomy (Right, 08/05/2016); Knee arthroscopy with lateral release (Right, 08/05/2016); and Joint replacement (Right, 03/13/2018). Family: family history includes Heart disease in his brother, brother, brother, brother, brother, father, and mother.  Constitutional Exam  General appearance: Well nourished, well developed, and well hydrated. In no apparent acute distress Vitals:   10/07/17 0950  BP: (!) 146/64  Pulse: 60  Temp: 98.2  F (36.8 C)  SpO2: 100%  Weight: 170 lb (77.1 kg)  Height: '5\' 6"'  (1.676 m)   BMI Assessment: Estimated body mass index is 27.44 kg/m as calculated from the following:   Height as of this encounter: '5\' 6"'  (1.676 m).   Weight as of this encounter: 170 lb (77.1 kg).  BMI interpretation table: BMI level Category Range association with higher incidence of chronic pain  <18 kg/m2 Underweight   18.5-24.9 kg/m2 Ideal body weight   25-29.9 kg/m2 Overweight Increased incidence by 20%  30-34.9 kg/m2 Obese (Class I) Increased incidence by 68%  35-39.9 kg/m2 Severe obesity (Class II) Increased incidence by 136%  >40 kg/m2 Extreme obesity (Class III) Increased incidence by 254%   Patient's current BMI Ideal Body weight  Body mass index is 27.44 kg/m. Ideal body weight: 63.8 kg (140 lb 10.5 oz) Adjusted ideal body weight: 69.1 kg (152 lb 6.3 oz)   BMI Readings from Last 4 Encounters:  10/07/17 27.44 kg/m  09/16/17 27.44 kg/m  09/09/17 27.76 kg/m  08/13/17 27.44 kg/m   Wt Readings from Last 4 Encounters:  10/07/17 170 lb (77.1 kg)  09/16/17 170 lb (77.1 kg)  09/09/17 172 lb (78 kg)  08/13/17 170 lb (77.1 kg)  Psych/Mental status: Alert, oriented x 3 (person, place, & time)       Eyes: PERLA Respiratory: No evidence of acute respiratory distress  Cervical Spine Area Exam  Skin & Axial Inspection: No masses, redness, edema, swelling, or associated skin lesions Alignment: Symmetrical Functional ROM: Decreased ROM      Stability: No instability detected Muscle Tone/Strength: Functionally intact. No obvious neuro-muscular anomalies detected. Sensory (Neurological): Movement-associated pain Palpation: No palpable anomalies              Upper Extremity (UE) Exam    Side: Right upper extremity  Side: Left upper extremity  Skin & Extremity Inspection: Skin color, temperature, and hair growth are WNL. No peripheral edema or cyanosis. No masses, redness, swelling, asymmetry, or associated  skin lesions. No contractures.  Skin & Extremity Inspection: Skin color, temperature, and hair growth are WNL. No peripheral edema or cyanosis. No masses, redness, swelling, asymmetry, or associated skin lesions. No contractures.  Functional ROM: Unrestricted ROM  Functional ROM: Unrestricted ROM          Muscle Tone/Strength: Functionally intact. No obvious neuro-muscular anomalies detected.  Muscle Tone/Strength: Functionally intact. No obvious neuro-muscular anomalies detected.  Sensory (Neurological): Unimpaired          Sensory (Neurological): Unimpaired          Palpation: No palpable anomalies              Palpation: No palpable anomalies              Specialized Test(s): Deferred         Specialized Test(s): Deferred          Thoracic Spine Area Exam  Skin & Axial Inspection: No masses, redness, or swelling Alignment: Symmetrical Functional ROM: Unrestricted ROM Stability: No instability detected Muscle Tone/Strength: Functionally intact. No obvious neuro-muscular anomalies detected. Sensory (Neurological): Unimpaired Muscle strength & Tone: No palpable anomalies  Lumbar Spine Area Exam  Skin & Axial Inspection: No masses, redness, or swelling Alignment: Symmetrical Functional ROM: Decreased ROM       Stability: No instability detected Muscle Tone/Strength: Functionally intact. No obvious neuro-muscular anomalies detected. Sensory (Neurological): Movement-associated pain Palpation: No palpable anomalies       Provocative Tests: Lumbar Hyperextension and rotation test: evaluation deferred today       Lumbar Lateral bending test: evaluation deferred today       Patrick's Maneuver: evaluation deferred today                    Gait & Posture Assessment  Ambulation: Unassisted Gait: Relatively normal for age and body habitus Posture: WNL   Lower Extremity Exam    Side: Right lower extremity  Side: Left lower extremity  Stability: No instability observed           Stability: No instability observed          Skin & Extremity Inspection: Skin color, temperature, and hair growth are WNL. No peripheral edema or cyanosis. No masses, redness, swelling, asymmetry, or associated skin lesions. No contractures.  Skin & Extremity Inspection: Skin color, temperature, and hair growth are WNL. No peripheral edema or cyanosis. No masses, redness, swelling, asymmetry, or associated skin lesions. No contractures.  Functional ROM: Unrestricted ROM                  Functional ROM: Unrestricted ROM                  Muscle Tone/Strength: Functionally intact. No obvious neuro-muscular anomalies detected.  Muscle Tone/Strength: Functionally intact. No obvious neuro-muscular anomalies detected.  Sensory (Neurological): Unimpaired  Sensory (Neurological): Unimpaired  Palpation: No palpable anomalies  Palpation: No palpable anomalies   Assessment  Primary Diagnosis & Pertinent Problem List: The primary encounter diagnosis was Spinal stenosis, lumbar region, with neurogenic claudication. Diagnoses of Lumbar central spinal stenosis (Severe at L4-5), Chronic Lumbar radiculitis (intermittent/recurrent) (Bilateral), Chronic lower extremity pain (Right), and Chronic low back pain (Primary Source of Pain) (Bilateral) (R>L) were also pertinent to this visit.  Status Diagnosis  Stable Stable Improved 1. Spinal stenosis, lumbar region, with neurogenic claudication   2. Lumbar central spinal stenosis (Severe at L4-5)   3. Chronic Lumbar radiculitis (intermittent/recurrent) (Bilateral)   4. Chronic lower extremity pain (Right)   5. Chronic low back pain (Primary Source of Pain) (Bilateral) (R>L)     Problems updated and reviewed during this visit: No problems updated. Plan of Care  Pharmacotherapy (Medications Ordered): No orders of the  defined types were placed in this encounter.  Medications administered today: Juventino B. Busby "Mikki Santee" had no medications administered during this  visit.  Procedure Orders    No procedure(s) ordered today   Lab Orders  No laboratory test(s) ordered today   Imaging Orders  No imaging studies ordered today    Referral Orders     Ambulatory referral to Neurosurgery  Interventional management options: Planned, scheduled, and/or pending:   None at this time    Considering:   Palliative right-sided L3-4 LESI #4 Palliative right-sided L4-5 LESI #2 Palliative bilateral lumbar facet block #3.  Diagnostic right-sided L2-3 transforaminal ESI + bilateral L3-4 and/or L4-5transforaminal ESI   Palliative PRN treatment(s):   Palliative right-sided L3-4 LESI #4 Palliative right-sided L4-5 LESI #2 Palliative bilateral lumbar facet block #3.   Provider-requested follow-up: Return for PRN Procedure.  Future Appointments  Date Time Provider [place]  11/06/2017  8:30 AM [facility] 2 [facility] None  11/06/2017  9:30 AM Dew, Erskine Squibb, MD [facility] None  03/15/2018  8:20 AM Jerrol Banana., MD [facility] None   Primary Care Physician: Jerrol Banana., MD Location: [facility] Outpatient Pain Management Facility Note by: Gaspar Cola, MD Date: 10/07/2017; Time: 11:47 AM

## 2017-10-15 DIAGNOSIS — M48062 Spinal stenosis, lumbar region with neurogenic claudication: Secondary | ICD-10-CM | POA: Diagnosis not present

## 2017-10-15 DIAGNOSIS — M5412 Radiculopathy, cervical region: Secondary | ICD-10-CM | POA: Diagnosis not present

## 2017-10-20 ENCOUNTER — Other Ambulatory Visit: Payer: Self-pay | Admitting: Neurosurgery

## 2017-10-20 DIAGNOSIS — M545 Low back pain: Secondary | ICD-10-CM | POA: Diagnosis not present

## 2017-10-20 DIAGNOSIS — M6281 Muscle weakness (generalized): Secondary | ICD-10-CM | POA: Diagnosis not present

## 2017-10-20 DIAGNOSIS — R262 Difficulty in walking, not elsewhere classified: Secondary | ICD-10-CM | POA: Diagnosis not present

## 2017-10-20 DIAGNOSIS — M5412 Radiculopathy, cervical region: Secondary | ICD-10-CM

## 2017-10-28 DIAGNOSIS — R262 Difficulty in walking, not elsewhere classified: Secondary | ICD-10-CM | POA: Diagnosis not present

## 2017-10-28 DIAGNOSIS — M6281 Muscle weakness (generalized): Secondary | ICD-10-CM | POA: Diagnosis not present

## 2017-10-28 DIAGNOSIS — M545 Low back pain: Secondary | ICD-10-CM | POA: Diagnosis not present

## 2017-10-30 ENCOUNTER — Other Ambulatory Visit: Payer: Self-pay | Admitting: Family Medicine

## 2017-10-30 DIAGNOSIS — R262 Difficulty in walking, not elsewhere classified: Secondary | ICD-10-CM | POA: Diagnosis not present

## 2017-10-30 DIAGNOSIS — M6281 Muscle weakness (generalized): Secondary | ICD-10-CM | POA: Diagnosis not present

## 2017-10-30 DIAGNOSIS — M545 Low back pain: Secondary | ICD-10-CM | POA: Diagnosis not present

## 2017-11-02 DIAGNOSIS — M545 Low back pain: Secondary | ICD-10-CM | POA: Diagnosis not present

## 2017-11-02 DIAGNOSIS — R262 Difficulty in walking, not elsewhere classified: Secondary | ICD-10-CM | POA: Diagnosis not present

## 2017-11-02 DIAGNOSIS — M6281 Muscle weakness (generalized): Secondary | ICD-10-CM | POA: Diagnosis not present

## 2017-11-04 ENCOUNTER — Ambulatory Visit
Admission: RE | Admit: 2017-11-04 | Discharge: 2017-11-04 | Disposition: A | Discharge status: Home / Self Care | Payer: PPO | Source: Ambulatory Visit | Attending: Neurosurgery | Admitting: Neurosurgery

## 2017-11-04 DIAGNOSIS — M4802 Spinal stenosis, cervical region: Secondary | ICD-10-CM | POA: Insufficient documentation

## 2017-11-04 DIAGNOSIS — M2578 Osteophyte, vertebrae: Secondary | ICD-10-CM | POA: Insufficient documentation

## 2017-11-04 DIAGNOSIS — M5412 Radiculopathy, cervical region: Secondary | ICD-10-CM | POA: Diagnosis not present

## 2017-11-04 DIAGNOSIS — M542 Cervicalgia: Secondary | ICD-10-CM | POA: Diagnosis not present

## 2017-11-05 ENCOUNTER — Other Ambulatory Visit (INDEPENDENT_AMBULATORY_CARE_PROVIDER_SITE_OTHER): Payer: Self-pay

## 2017-11-05 DIAGNOSIS — I6523 Occlusion and stenosis of bilateral carotid arteries: Secondary | ICD-10-CM

## 2017-11-05 DIAGNOSIS — M6281 Muscle weakness (generalized): Secondary | ICD-10-CM | POA: Diagnosis not present

## 2017-11-05 DIAGNOSIS — M545 Low back pain: Secondary | ICD-10-CM | POA: Diagnosis not present

## 2017-11-05 DIAGNOSIS — R262 Difficulty in walking, not elsewhere classified: Secondary | ICD-10-CM | POA: Diagnosis not present

## 2017-11-06 ENCOUNTER — Ambulatory Visit (INDEPENDENT_AMBULATORY_CARE_PROVIDER_SITE_OTHER): Payer: PPO | Admitting: Vascular Surgery

## 2017-11-06 ENCOUNTER — Telehealth (INDEPENDENT_AMBULATORY_CARE_PROVIDER_SITE_OTHER): Payer: Self-pay | Admitting: Vascular Surgery

## 2017-11-06 ENCOUNTER — Encounter (INDEPENDENT_AMBULATORY_CARE_PROVIDER_SITE_OTHER): Payer: PPO

## 2017-11-06 NOTE — Telephone Encounter (Signed)
LVM--Patient needs to know date and time of his [place] and then schedule fu w/JD

## 2017-11-09 DIAGNOSIS — R262 Difficulty in walking, not elsewhere classified: Secondary | ICD-10-CM | POA: Diagnosis not present

## 2017-11-09 DIAGNOSIS — M545 Low back pain: Secondary | ICD-10-CM | POA: Diagnosis not present

## 2017-11-09 DIAGNOSIS — M6281 Muscle weakness (generalized): Secondary | ICD-10-CM | POA: Diagnosis not present

## 2017-11-12 DIAGNOSIS — M545 Low back pain: Secondary | ICD-10-CM | POA: Diagnosis not present

## 2017-11-12 DIAGNOSIS — M6281 Muscle weakness (generalized): Secondary | ICD-10-CM | POA: Diagnosis not present

## 2017-11-12 DIAGNOSIS — R262 Difficulty in walking, not elsewhere classified: Secondary | ICD-10-CM | POA: Diagnosis not present

## 2017-11-13 ENCOUNTER — Other Ambulatory Visit (INDEPENDENT_AMBULATORY_CARE_PROVIDER_SITE_OTHER): Payer: Self-pay

## 2017-11-13 DIAGNOSIS — I6523 Occlusion and stenosis of bilateral carotid arteries: Secondary | ICD-10-CM

## 2017-11-16 ENCOUNTER — Ambulatory Visit
Admission: RE | Admit: 2017-11-16 | Discharge: 2017-11-16 | Disposition: A | Discharge status: Home / Self Care | Payer: PPO | Source: Ambulatory Visit | Attending: Vascular Surgery | Admitting: Vascular Surgery

## 2017-11-16 DIAGNOSIS — I6523 Occlusion and stenosis of bilateral carotid arteries: Secondary | ICD-10-CM | POA: Diagnosis not present

## 2017-11-19 DIAGNOSIS — M48062 Spinal stenosis, lumbar region with neurogenic claudication: Secondary | ICD-10-CM | POA: Diagnosis not present

## 2017-11-19 DIAGNOSIS — G959 Disease of spinal cord, unspecified: Secondary | ICD-10-CM | POA: Diagnosis not present

## 2017-11-24 ENCOUNTER — Encounter (INDEPENDENT_AMBULATORY_CARE_PROVIDER_SITE_OTHER): Payer: Self-pay | Admitting: Vascular Surgery

## 2017-11-24 ENCOUNTER — Ambulatory Visit (INDEPENDENT_AMBULATORY_CARE_PROVIDER_SITE_OTHER): Payer: PPO | Admitting: Vascular Surgery

## 2017-11-24 VITALS — BP 143/61 | HR 62 | Resp 16 | Ht 66.0 in | Wt 173.8 lb

## 2017-11-24 DIAGNOSIS — I10 Essential (primary) hypertension: Secondary | ICD-10-CM

## 2017-11-24 DIAGNOSIS — I6523 Occlusion and stenosis of bilateral carotid arteries: Secondary | ICD-10-CM | POA: Diagnosis not present

## 2017-11-24 DIAGNOSIS — E785 Hyperlipidemia, unspecified: Secondary | ICD-10-CM | POA: Diagnosis not present

## 2017-11-24 NOTE — Progress Notes (Signed)
MRN : [id number]  Austin [name] is a 84 y.o. (1928/09/01) male who presents with chief complaint of  Chief Complaint  Patient presents with  . Carotid    43yr follow up  .  History of Present Illness: Patient returns in follow-up of his carotid disease.  He is doing well without specific complaints today.  He has been caring for his wife who had an orthopedic injury several months ago.  He denies focal neurologic symptoms. Specifically, the patient denies amaurosis fugax, speech or swallowing difficulties, or arm or leg weakness or numbness.  His carotid duplex recently done at the hospital does show some progression of his disease with velocities just into the greater than 70% range bilaterally by the hospital criteria.  By our criteria, they would fall in the 60 to 79% range.  Current Outpatient Medications  Medication Sig Dispense Refill  . amLODipine (NORVASC) 5 MG tablet Take 5 mg by mouth daily.    Marland Kitchen aspirin EC 81 MG tablet Take 81 mg by mouth daily.    Marland Kitchen atorvastatin (LIPITOR) 20 MG tablet Take 20 mg by mouth every other day.     . Biotin 5000 MCG CAPS Take 5,000 mcg by mouth daily.     . Cholecalciferol (VITAMIN D3) 2000 UNITS capsule Take 2,000 Units by mouth 2 (two) times daily.     . dorzolamide-timolol (COSOPT) 22.3-6.8 MG/ML ophthalmic solution Place 1 drop into both eyes 2 (two) times daily.     Marland Kitchen glucosamine-chondroitin 500-400 MG tablet Take 1 tablet by mouth 2 (two) times daily.    . isosorbide mononitrate (IMDUR) 60 MG 24 hr tablet Take 60 mg by mouth daily.     . nitroGLYCERIN (NITROSTAT) 0.4 MG SL tablet Place 0.4 mg under the tongue every 5 (five) minutes as needed for chest pain.     Marland Kitchen OMEGA-3 FATTY ACIDS PO Take 1,200 mcg by mouth 2 (two) times daily.     Marland Kitchen omeprazole (PRILOSEC) 20 MG capsule Take 20 mg by mouth daily. Reported on 08/21/2015    . tamsulosin (FLOMAX) 0.4 MG CAPS capsule TAKE 1 CAPSULE BY MOUTH EVERY DAY 30 capsule 11  . Travoprost, BAK Free,  (TRAVATAN Z) 0.004 % SOLN ophthalmic solution Place 1 drop into both eyes at bedtime.     . valsartan-hydrochlorothiazide (DIOVAN HCT) 320-25 MG per tablet Take 1 tablet by mouth daily.     . vitamin B-12 (CYANOCOBALAMIN) 1000 MCG tablet Take 1,000 mcg by mouth daily.     No current facility-administered medications for this visit.     Past Medical History:  Diagnosis Date  . Absolute anemia 12/08/2014  . Allergy   . Arthritis   . Bradycardia 05/03/2015  . Calculus of kidney 12/08/2014  . Coronary artery disease   . GERD (gastroesophageal reflux disease)   . History of kidney stones   . HLD (hyperlipidemia)   . Hyperglycemia 08/27/2015  . Hypertension   . Myocardial infarction ([place]) 2011  . Peripheral vascular disease ([place])   . VHD (valvular heart disease)     Past Surgical History:  Procedure Laterality Date  . CARDIAC CATHETERIZATION  2007  . CARDIAC CATHETERIZATION N/A 04/17/2015   Procedure: Left Heart Cath and Coronary Angiography;  Surgeon: Corey Skains, MD;  Location: [facility];  Service: Cardiovascular;  Laterality: N/A;  . CARPAL TUNNEL RELEASE Bilateral    right 06/09/08 left 05/03/09  . COLONOSCOPY N/A 10/02/2014   Procedure: COLONOSCOPY;  Surgeon: Herbie Baltimore T  Vira Agar, MD;  Location: [facility];  Service: Endoscopy;  Laterality: N/A;  . CORONARY ARTERY BYPASS GRAFT  2007  . HEMORROIDECTOMY    . JOINT REPLACEMENT Right 03/13/2018   partial knee replacement  . KNEE ARTHROSCOPY Right 08/05/2016   Procedure: ARTHROSCOPY KNEE;  Surgeon: Thornton Park, MD;  Location: [facility];  Service: Orthopedics;  Laterality: Right;  . KNEE ARTHROSCOPY WITH LATERAL RELEASE Right 08/05/2016   Procedure: KNEE ARTHROSCOPY WITH LATERAL RELEASE;  Surgeon: Thornton Park, MD;  Location: [facility];  Service: Orthopedics;  Laterality: Right;  . KNEE ARTHROSCOPY WITH MEDIAL MENISECTOMY Right 08/05/2016   Procedure: KNEE ARTHROSCOPY WITH MEDIAL MENISECTOMY;  Surgeon: Thornton Park, MD;   Location: [facility];  Service: Orthopedics;  Laterality: Right;  Partial  . LUMBAR EPIDURAL INJECTION  2011  . MOUTH SURGERY    . SHOULDER SURGERY Right   . SYNOVECTOMY Right 08/05/2016   Procedure: SYNOVECTOMY;  Surgeon: Thornton Park, MD;  Location: [facility];  Service: Orthopedics;  Laterality: Right;  arthroscopic extensive synovectomy  . TONSILLECTOMY     Social History  Substance Use Topics  . Smoking status: Former Smoker    Years: 15.00    Types: Cigarettes  . Smokeless tobacco: Never Used  . Alcohol use Yes      Comment: 1-2 glasses of wine a night    Family History      Family History  Problem Relation Age of Onset  . Heart disease Mother   . Heart disease Father   . Heart disease Brother   . Heart disease Brother   . Heart disease Brother   . Heart disease Brother          Allergies  Allergen Reactions  . Penicillins Itching, Swelling and Rash    Has patient had a PCN reaction causing immediate rash, facial/tongue/throat swelling, SOB or lightheadedness with hypotension: Yes Has patient had a PCN reaction causing severe rash involving mucus membranes or skin necrosis: No Has patient had a PCN reaction that required hospitalization No Has patient had a PCN reaction occurring within the last 10 years: No If all of the above answers are "NO", then may proceed with Cephalosporin use.      REVIEW OF SYSTEMS (Negative unless checked)  Constitutional: [] Weight loss  [] Fever  [] Chills Cardiac: [] Chest pain   [] Chest pressure   [] Palpitations   [] Shortness of breath when laying flat   [] Shortness of breath at rest   [] Shortness of breath with exertion. Vascular:  [] Pain in legs with walking   [] Pain in legs at rest   [] Pain in legs when laying flat   [] Claudication   [] Pain in feet when walking  [] Pain in feet at rest  [] Pain in feet when laying flat   [] History of DVT   [] Phlebitis   [] Swelling in legs   [] Varicose veins   [] Non-healing  ulcers Pulmonary:   [] Uses home oxygen   [] Productive cough   [] Hemoptysis   [] Wheeze  [] COPD   [] Asthma Neurologic:  [] Dizziness  [] Blackouts   [] Seizures   [] History of stroke   [] History of TIA  [] Aphasia   [] Temporary blindness   [] Dysphagia   [] Weakness or numbness in arms   [] Weakness or numbness in legs Musculoskeletal:  [x] Arthritis   [] Joint swelling   [] Joint pain   [] Low back pain Hematologic:  [] Easy bruising  [] Easy bleeding   [] Hypercoagulable state   [] Anemic  [] Hepatitis Gastrointestinal:  [] Blood in stool   [] Vomiting blood  [] Gastroesophageal reflux/heartburn   []   Difficulty swallowing. Genitourinary:  [] Chronic kidney disease   [] Difficult urination  [] Frequent urination  [] Burning with urination   [] Blood in urine Skin:  [] Rashes   [] Ulcers   [] Wounds Psychological:  [] History of anxiety   []  History of major depression.     Physical Examination  Vitals:   11/24/17 1555 11/24/17 1556  BP: (!) 151/65 (!) 143/61  Pulse: 62   Resp: 16   Weight: 173 lb 12.8 oz (78.8 kg)   Height: 5\' 6"  (1.676 m)    Body mass index is 28.05 kg/m. Gen:  WD/WN, NAD.  Appears younger than stated age Head: [place]/AT, No temporalis wasting. Ear/Nose/Throat: Hearing grossly intact, nares w/o erythema or drainage, trachea midline Eyes: Conjunctiva clear. Sclera non-icteric Neck: Supple.  Soft bilateral carotid bruits Pulmonary:  Good air movement, equal and clear to auscultation bilaterally.  Cardiac: RRR, No JVD Vascular:  Vessel Right Left  Radial Palpable Palpable                                    Musculoskeletal: M/S 5/5 throughout.  No deformity or atrophy.  No edema. Neurologic: CN 2-12 intact. Sensation grossly intact in extremities.  Symmetrical.  Speech is fluent. Motor exam as listed above. Psychiatric: Judgment intact, Mood & affect appropriate for pt's clinical situation. Dermatologic: No rashes or ulcers noted.  No cellulitis or open wounds.      CBC Lab  Results  Component Value Date   WBC 5.2 03/05/2017   HGB 14.1 03/05/2017   HCT 41.4 03/05/2017   MCV 90.4 03/05/2017   PLT 203 03/05/2017    BMET    Component Value Date/Time   NA 137 03/05/2017 1036   NA 139 02/27/2016 1031   NA 137 05/09/2013 0912   K 4.1 03/05/2017 1036   K 4.4 05/09/2013 0912   CL 102 03/05/2017 1036   CL 103 05/09/2013 0912   CO2 27 03/05/2017 1036   CO2 29 05/09/2013 0912   GLUCOSE 120 (H) 03/05/2017 1036   GLUCOSE 93 05/09/2013 0912   BUN 17 03/05/2017 1036   BUN 25 02/27/2016 1031   BUN 26 (H) 05/09/2013 0912   CREATININE 1.50 (H) 03/05/2017 1036   CALCIUM 9.4 03/05/2017 1036   CALCIUM 9.2 05/09/2013 0912   GFRNONAA 40 (L) 07/28/2016 1146   GFRNONAA 43 (L) 05/09/2013 0912   GFRAA 47 (L) 07/28/2016 1146   GFRAA 50 (L) 05/09/2013 0912   CrCl cannot be calculated (Patient's most recent lab result is older than the maximum 21 days allowed.).  COAG Lab Results  Component Value Date   INR 1.03 07/28/2016    Radiology Mr Cervical Spine Wo Contrast  Result Date: 11/04/2017 CLINICAL DATA:  Neck pain with tingling down both arms extending to the fingers. Symptoms for 2 years, worsened for 2 months. EXAM: MRI CERVICAL SPINE WITHOUT CONTRAST TECHNIQUE: Multiplanar, multisequence MR imaging of the cervical spine was performed. No intravenous contrast was administered. COMPARISON:  None. FINDINGS: Alignment: Slight anterolisthesis at C4-5 and C7-T1 Vertebrae: No fracture, evidence of discitis, or bone lesion. Cord: Degenerative cord deformity as described below. No cord signal abnormality. Posterior Fossa, vertebral arteries, paraspinal tissues: Negative Disc levels: C1-2: Retro dental ligamentous thickening without cervicomedullary deformity. C2-3: Facet and uncovertebral ridging. Shallow central protrusion. Moderate right foraminal narrowing. C3-4: Disc narrowing with endplate and uncovertebral ridging. Ligamentum flavum thickening. There is spinal stenosis  with cord flattening. No cord  signal abnormality. Advanced biforaminal impingement. C4-5: Degenerative disc narrowing with endplate and uncovertebral ridging. There is a component of disc bulging or protrusion. The ligamentum flavum is thickened. Spinal stenosis with cord flattening. Moderate to advanced right and moderate left foraminal narrowing. C5-6: Disc narrowing with posterior disc osteophyte complex and ligamentum flavum thickening causing spinal stenosis with cord flattening. There is a superimposed left paracentral to foraminal protrusion with advanced left C6 impingement. Right foraminal narrowing is also advanced. C6-7: Disc narrowing with uncovertebral spurring. Moderate foraminal impingement bilaterally. Patent spinal canal. C7-T1:Facet spurring and disc narrowing with mild right and moderate left foraminal narrowing. Patent spinal canal IMPRESSION: 1. Disc osteophyte complexes and ligamentum flavum thickening cause spinal stenosis with cord flattening at C3-4 to C5-6. No cord signal abnormality. 2. Foraminal impingement throughout the cervical spine with the most severe stenoses seen bilaterally at C3-4, on the right at C4-5, and bilaterally at C5-6 Electronically Signed   By: Monte Fantasia M.D.   On: 11/04/2017 13:44   US Carotid Duplex Bilateral  Result Date: 11/16/2017 CLINICAL DATA:  Bilateral carotid artery stenosis EXAM: BILATERAL CAROTID DUPLEX ULTRASOUND TECHNIQUE: Pearline Cables scale imaging, color Doppler and duplex ultrasound were performed of bilateral carotid and vertebral arteries in the neck. COMPARISON:  03/16/2012 FINDINGS: Criteria: Quantification of carotid stenosis is based on velocity parameters that correlate the residual internal carotid diameter with NASCET-based stenosis levels, using the diameter of the distal internal carotid lumen as the denominator for stenosis measurement. The following velocity measurements were obtained: RIGHT ICA:  240 cm/sec CCA:  660 cm/sec SYSTOLIC  ICA/CCA RATIO:  2.1 DIASTOLIC ICA/CCA RATIO:  3.3 ECA:  271 cm/sec LEFT ICA:  235 cm/sec CCA:  630 cm/sec SYSTOLIC ICA/CCA RATIO:  2.0 DIASTOLIC ICA/CCA RATIO:  1.5 ECA:  345 cm/sec RIGHT CAROTID ARTERY: There is irregular calcified plaque in the mid and upper common carotid as well as the bulb. Low resistance internal carotid Doppler pattern is preserved. RIGHT VERTEBRAL ARTERY:  Antegrade. LEFT CAROTID ARTERY: There is smooth calcified plaque in the mid common carotid. There is extensive irregular calcified plaque in the bulb. Low resistance internal carotid Doppler pattern. LEFT VERTEBRAL ARTERY:  Antegrade. IMPRESSION: Greater than 70% stenosis in the right and left internal carotid arteries. Antegrade vertebral arteries bilaterally. Electronically Signed   By: Marybelle Killings M.D.   On: 11/16/2017 16:17    Assessment/Plan Benign essential HTN blood pressure control important in reducing the progression of atherosclerotic disease. On appropriate oral medications.   HLD (hyperlipidemia) lipid control important in reducing the progression of atherosclerotic disease. Continue statin therapy   Carotid stenosis His carotid duplex recently done at the hospital does show some progression of his disease with velocities just into the greater than 70% range bilaterally by the hospital criteria.  By our criteria, they would fall in the 60 to 79% range. We discussed today that he is on appropriate medical therapy with aspirin and a statin agent.  He remains asymptomatic.  In discussions with the patient, no intervention would currently be of significant benefit and he would like to continue medical management and observation.  At this point, I do think it would be prudent to shorten his follow-up and we will plan on seeing him back in about 6 months with a duplex.    Leotis Pain, MD  11/25/2017 12:04 PM    This note was created with Dragon medical transcription system.  Any errors from dictation are  purely unintentional

## 2017-11-25 NOTE — Assessment & Plan Note (Signed)
His carotid duplex recently done at the hospital does show some progression of his disease with velocities just into the greater than 70% range bilaterally by the hospital criteria.  By our criteria, they would fall in the 60 to 79% range. We discussed today that he is on appropriate medical therapy with aspirin and a statin agent.  He remains asymptomatic.  In discussions with the patient, no intervention would currently be of significant benefit and he would like to continue medical management and observation.  At this point, I do think it would be prudent to shorten his follow-up and we will plan on seeing him back in about 6 months with a duplex.

## 2017-11-30 ENCOUNTER — Ambulatory Visit (INDEPENDENT_AMBULATORY_CARE_PROVIDER_SITE_OTHER): Payer: PPO | Admitting: Family Medicine

## 2017-11-30 ENCOUNTER — Encounter: Payer: Self-pay | Admitting: Family Medicine

## 2017-11-30 VITALS — BP 122/64 | HR 54 | Temp 98.7°F | Resp 16 | Wt 174.0 lb

## 2017-11-30 DIAGNOSIS — I25810 Atherosclerosis of coronary artery bypass graft(s) without angina pectoris: Secondary | ICD-10-CM | POA: Diagnosis not present

## 2017-11-30 DIAGNOSIS — K219 Gastro-esophageal reflux disease without esophagitis: Secondary | ICD-10-CM

## 2017-11-30 DIAGNOSIS — R140 Abdominal distension (gaseous): Secondary | ICD-10-CM

## 2017-11-30 DIAGNOSIS — I10 Essential (primary) hypertension: Secondary | ICD-10-CM | POA: Diagnosis not present

## 2017-11-30 DIAGNOSIS — E785 Hyperlipidemia, unspecified: Secondary | ICD-10-CM | POA: Diagnosis not present

## 2017-11-30 MED ORDER — RANITIDINE HCL 150 MG PO CAPS: 150.0000 mg | ORAL_CAPSULE | Freq: Every evening | ORAL | 5 refills | Status: DC

## 2017-11-30 NOTE — Progress Notes (Signed)
Patient: Austin [name] Male    DOB: 1929/03/18   84 y.o.   MRN: [id number] Visit Date: 11/30/2017  Today's Provider: Wilhemena Durie, MD   Chief Complaint  Patient presents with  . stomach issues   Subjective:    HPI Patient comes in today c/o stomach issues. He reports that he has a bloated sensation. He has also experienced a "churning" sensation. He denies having bloody or black stools. Denies constipation. He has occasional nausea, but reports that it does not last long. He takes a daily stool softener, which seems to make his BMs a little easier. He does not feel his symptoms are in any relation to foods. No weight gain/weight loss/change in fit of pants. His wife broke her hip a month ago but is now doing well.     Allergies  Allergen Reactions  . Penicillins Itching, Swelling and Rash    Has patient had a PCN reaction causing immediate rash, facial/tongue/throat swelling, SOB or lightheadedness with hypotension: Yes Has patient had a PCN reaction causing severe rash involving mucus membranes or skin necrosis: No Has patient had a PCN reaction that required hospitalization No Has patient had a PCN reaction occurring within the last 10 years: No If all of the above answers are "NO", then may proceed with Cephalosporin use.      Current Outpatient Medications:  .  amLODipine (NORVASC) 5 MG tablet, Take 5 mg by mouth daily., Disp: , Rfl:  .  aspirin EC 81 MG tablet, Take 81 mg by mouth daily., Disp: , Rfl:  .  atorvastatin (LIPITOR) 20 MG tablet, Take 20 mg by mouth every other day. , Disp: , Rfl:  .  Biotin 5000 MCG CAPS, Take 5,000 mcg by mouth daily. , Disp: , Rfl:  .  Cholecalciferol (VITAMIN D3) 2000 UNITS capsule, Take 2,000 Units by mouth 2 (two) times daily. , Disp: , Rfl:  .  dorzolamide-timolol (COSOPT) 22.3-6.8 MG/ML ophthalmic solution, Place 1 drop into both eyes 2 (two) times daily. , Disp: , Rfl:  .  glucosamine-chondroitin 500-400 MG tablet, Take 1  tablet by mouth 2 (two) times daily., Disp: , Rfl:  .  isosorbide mononitrate (IMDUR) 60 MG 24 hr tablet, Take 60 mg by mouth daily. , Disp: , Rfl:  .  nitroGLYCERIN (NITROSTAT) 0.4 MG SL tablet, Place 0.4 mg under the tongue every 5 (five) minutes as needed for chest pain. , Disp: , Rfl:  .  OMEGA-3 FATTY ACIDS PO, Take 1,200 mcg by mouth 2 (two) times daily. , Disp: , Rfl:  .  omeprazole (PRILOSEC) 20 MG capsule, Take 20 mg by mouth daily. Reported on 08/21/2015, Disp: , Rfl:  .  tamsulosin (FLOMAX) 0.4 MG CAPS capsule, TAKE 1 CAPSULE BY MOUTH EVERY DAY, Disp: 30 capsule, Rfl: 11 .  Travoprost, BAK Free, (TRAVATAN Z) 0.004 % SOLN ophthalmic solution, Place 1 drop into both eyes at bedtime. , Disp: , Rfl:  .  valsartan-hydrochlorothiazide (DIOVAN HCT) 320-25 MG per tablet, Take 1 tablet by mouth daily. , Disp: , Rfl:  .  vitamin B-12 (CYANOCOBALAMIN) 1000 MCG tablet, Take 1,000 mcg by mouth daily., Disp: , Rfl:   Review of Systems  Constitutional: Positive for activity change, appetite change and fatigue.  HENT: Negative.   Eyes: Negative.   Gastrointestinal: Positive for abdominal distention and nausea. Negative for anal bleeding, blood in stool, diarrhea, rectal pain and vomiting.  Endocrine: Negative.   Genitourinary: Negative for difficulty urinating,  dysuria, flank pain, frequency and urgency.  Musculoskeletal: Positive for myalgias.  Skin: Negative for color change, pallor, rash and wound.  Allergic/Immunologic: Negative.   Neurological: Negative for dizziness, light-headedness and headaches.  Psychiatric/Behavioral: Negative.     Social History   Tobacco Use  . Smoking status: Former Smoker    Years: 15.00    Types: Cigarettes  . Smokeless tobacco: Never Used  Substance Use Topics  . Alcohol use: Yes    Comment: 1-2 glasses of wine a night   Objective:   BP 122/64 (BP Location: Left Arm, Patient Position: Sitting, Cuff Size: Normal)   Pulse (!) 54   Temp 98.7 F (37.1 C)    Resp 16   Wt 174 lb (78.9 kg)   SpO2 99%   BMI 28.08 kg/m  Vitals:   11/30/17 1324  BP: 122/64  Pulse: (!) 54  Resp: 16  Temp: 98.7 F (37.1 C)  SpO2: 99%  Weight: 174 lb (78.9 kg)     Physical Exam  Constitutional: He is oriented to person, place, and time. He appears well-developed and well-nourished.  HENT:  Head: Normocephalic and atraumatic.  Right Ear: External ear normal.  Left Ear: External ear normal.  Nose: Nose normal.  Eyes: Conjunctivae are normal. No scleral icterus.  Cardiovascular: Normal rate, regular rhythm and normal heart sounds.  Pulmonary/Chest: Effort normal and breath sounds normal.  Abdominal: Soft.  Musculoskeletal: He exhibits no edema.  Neurological: He is alert and oriented to person, place, and time.  Skin: Skin is warm and dry.  Psychiatric: He has a normal mood and affect. His behavior is normal. Judgment and thought content normal.        Assessment & Plan:     1. Gastroesophageal reflux disease, esophagitis presence not specified  - CBC with Differential/Platelet - ranitidine (ZANTAC) 150 MG capsule; Take 1 capsule (150 mg total) by mouth every evening.  Dispense: 30 capsule; Refill: 5  2. Bloating Doubt significant disease process,will f/u 1 month. Consider Abd [place] if persistent.  3. Benign essential HTN  - Comprehensive metabolic panel - TSH  4. Hyperlipidemia, unspecified hyperlipidemia type  - Lipid panel  5. Coronary artery disease involving other coronary artery bypass graft, angina presence unspecified       I have done the exam and reviewed the above chart and it is accurate to the best of my knowledge. Development worker, community has been used in this note in any air is in the dictation or transcription are unintentional.  Wilhemena Durie, MD  [place]

## 2017-12-02 DIAGNOSIS — E785 Hyperlipidemia, unspecified: Secondary | ICD-10-CM | POA: Diagnosis not present

## 2017-12-02 DIAGNOSIS — K219 Gastro-esophageal reflux disease without esophagitis: Secondary | ICD-10-CM | POA: Diagnosis not present

## 2017-12-02 DIAGNOSIS — I10 Essential (primary) hypertension: Secondary | ICD-10-CM | POA: Diagnosis not present

## 2017-12-03 LAB — COMPREHENSIVE METABOLIC PANEL
A/G RATIO: 1.9 (ref 1.2–2.2)
ALBUMIN: 4.4 g/dL (ref 3.5–4.7)
ALK PHOS: 61 IU/L (ref 39–117)
ALT: 11 IU/L (ref 0–44)
AST: 13 IU/L (ref 0–40)
BUN / CREAT RATIO: 12 (ref 10–24)
BUN: 22 mg/dL (ref 8–27)
Bilirubin Total: 0.6 mg/dL (ref 0.0–1.2)
CO2: 19 mmol/L — ABNORMAL LOW (ref 20–29)
Calcium: 9.3 mg/dL (ref 8.6–10.2)
Chloride: 102 mmol/L (ref 96–106)
Creatinine, Ser: 1.81 mg/dL — ABNORMAL HIGH (ref 0.76–1.27)
GFR calc Af Amer: 38 mL/min/{1.73_m2} — ABNORMAL LOW (ref >59)
GFR, EST NON AFRICAN AMERICAN: 33 mL/min/{1.73_m2} — AB (ref >59)
GLOBULIN, TOTAL: 2.3 g/dL (ref 1.5–4.5)
Glucose: 106 mg/dL — ABNORMAL HIGH (ref 65–99)
POTASSIUM: 4.2 mmol/L (ref 3.5–5.2)
SODIUM: 136 mmol/L (ref 134–144)
Total Protein: 6.7 g/dL (ref 6.0–8.5)

## 2017-12-03 LAB — CBC WITH DIFFERENTIAL/PLATELET
BASOS: 1 %
Basophils Absolute: 0 10*3/uL (ref 0.0–0.2)
EOS (ABSOLUTE): 0.2 10*3/uL (ref 0.0–0.4)
EOS: 4 %
HEMATOCRIT: 37 % — AB (ref 37.5–51.0)
HEMOGLOBIN: 12.2 g/dL — AB (ref 13.0–17.7)
Immature Grans (Abs): 0 10*3/uL (ref 0.0–0.1)
Immature Granulocytes: 0 %
Lymphocytes Absolute: 1.2 10*3/uL (ref 0.7–3.1)
Lymphs: 25 %
MCH: 31.2 pg (ref 26.6–33.0)
MCHC: 33 g/dL (ref 31.5–35.7)
MCV: 95 fL (ref 79–97)
MONOS ABS: 0.4 10*3/uL (ref 0.1–0.9)
Monocytes: 8 %
NEUTROS ABS: 3 10*3/uL (ref 1.4–7.0)
Neutrophils: 62 %
Platelets: 168 10*3/uL (ref 150–450)
RBC: 3.91 x10E6/uL — ABNORMAL LOW (ref 4.14–5.80)
RDW: 11.9 % — ABNORMAL LOW (ref 12.3–15.4)
WBC: 4.8 10*3/uL (ref 3.4–10.8)

## 2017-12-03 LAB — LIPID PANEL
CHOL/HDL RATIO: 3.1 ratio (ref 0.0–5.0)
CHOLESTEROL TOTAL: 123 mg/dL (ref 100–199)
HDL: 40 mg/dL (ref >39)
LDL CALC: 64 mg/dL (ref 0–99)
Triglycerides: 94 mg/dL (ref 0–149)
VLDL Cholesterol Cal: 19 mg/dL (ref 5–40)

## 2017-12-03 LAB — TSH: TSH: 2.54 u[IU]/mL (ref 0.450–4.500)

## 2017-12-08 ENCOUNTER — Telehealth: Payer: Self-pay

## 2017-12-08 NOTE — Telephone Encounter (Signed)
Patient advised as below. Patient verbalizes understanding and is in agreement with treatment plan.  

## 2017-12-08 NOTE — Telephone Encounter (Signed)
-----   Message from Jerrol Banana., MD sent at 12/07/2017  9:47 AM EDT ----- Labs ok but mild change in kidney /CBC--RTC 1 month to reassess.

## 2017-12-09 ENCOUNTER — Ambulatory Visit (INDEPENDENT_AMBULATORY_CARE_PROVIDER_SITE_OTHER): Payer: PPO | Admitting: Family Medicine

## 2017-12-09 VITALS — BP 122/50 | HR 62 | Temp 97.8°F | Resp 16 | Wt 171.0 lb

## 2017-12-09 DIAGNOSIS — I25110 Atherosclerotic heart disease of native coronary artery with unstable angina pectoris: Secondary | ICD-10-CM

## 2017-12-09 DIAGNOSIS — I129 Hypertensive chronic kidney disease with stage 1 through stage 4 chronic kidney disease, or unspecified chronic kidney disease: Secondary | ICD-10-CM

## 2017-12-09 DIAGNOSIS — M5136 Other intervertebral disc degeneration, lumbar region: Secondary | ICD-10-CM

## 2017-12-09 NOTE — Progress Notes (Addendum)
Austin [name]  MRN: [id number] DOB: 1928-10-02  Subjective:  HPI   The patient is an 84 year old male who presents today for surgical clearance.  He was last seen on 11/30/17.   The patient is scheduled for C3-6 anterior cervical discectomy and fusion on 12/30/17 by Dr Elayne Guerin at [facility].   Patient Active Problem List   Diagnosis Date Noted  . Spinal stenosis, lumbar region, with neurogenic claudication 10/07/2017  . Chronic lower extremity pain (Right) 08/25/2017  . Spondylosis without myelopathy or radiculopathy, lumbosacral region 08/25/2017  . DDD (degenerative disc disease), lumbar 08/13/2017  . Status post right partial knee replacement 08/13/2017  . Chronic Lumbar radiculitis (intermittent/recurrent) (Bilateral) 01/29/2017  . Easy bruising 01/14/2017  . Lumbar central spinal stenosis (Severe at L4-5) 12/30/2016  . Chronic pain syndrome 12/02/2016  . Full thickness rotator cuff tear 11/10/2016  . Stiffness of shoulder joint 11/10/2016  . Weakness of limb 11/10/2016  . Osteoarthritis of knee (Right) 11/10/2016  . Carotid stenosis 11/07/2016  . Chronic knee pain (Right) 07/30/2016  . Tear of medial meniscus of knee 07/16/2016  . Peripheral vascular disease ([place]) 08/14/2015  . Lumbar facet hypertrophy (multilevel) 08/14/2015  . Grade 1 Anterolisthesis of L3 over L4 08/14/2015  . Lumbar foraminal stenosis (Right L2-3; Bilateral L3-4, L4-5) 08/14/2015  . Chronic low back pain (Primary Source of Pain) (Bilateral) (R>L) 08/13/2015  . Lumbar spondylosis 08/13/2015  . Lumbar facet syndrome (Bilateral) (R>L) 08/13/2015  . Coronary artery disease involving native coronary artery of native heart with unstable angina pectoris ([place]) 01/08/2015  . Benign fibroma of prostate 12/08/2014  . Atherosclerosis of coronary artery 12/08/2014  . Cataract 12/08/2014  . Esophageal stenosis 12/08/2014  . Acid reflux 12/08/2014  . Glaucoma 12/08/2014  . Hemorrhoid 12/08/2014    . HLD (hyperlipidemia) 12/08/2014  . Obstructive apnea 12/08/2014  . Borderline diabetes 12/08/2014  . Esophagogastric ring 12/08/2014  . Heart valve disease 12/08/2014  . MI (mitral incompetence) 10/03/2014  . Cardiomyopathy, ischemic 10/03/2014  . History of colonic polyps 10/02/2014  . Benign essential HTN 09/20/2014    Past Medical History:  Diagnosis Date  . Absolute anemia 12/08/2014  . Allergy   . Arthritis   . Bradycardia 05/03/2015  . Calculus of kidney 12/08/2014  . Coronary artery disease   . GERD (gastroesophageal reflux disease)   . History of kidney stones   . HLD (hyperlipidemia)   . Hyperglycemia 08/27/2015  . Hypertension   . Myocardial infarction ([place]) 2011  . Peripheral vascular disease ([place])   . VHD (valvular heart disease)     Social History   Socioeconomic History  . Marital status: Married    Spouse name: Blanch Media  . Number of children: 1  . Years of education: Not on file  . Highest education level: Not on file  Occupational History    Employer: RETIRED  Social Needs  . Financial resource strain: Not on file  . Food insecurity:    Worry: Not on file    Inability: Not on file  . Transportation needs:    Medical: Not on file    Non-medical: Not on file  Tobacco Use  . Smoking status: Former Smoker    Years: 15.00    Types: Cigarettes  . Smokeless tobacco: Never Used  Substance and Sexual Activity  . Alcohol use: Yes    Comment: 1-2 glasses of wine a night  . Drug use: No  . Sexual activity: Never  Lifestyle  .  Physical activity:    Days per week: Not on file    Minutes per session: Not on file  . Stress: Not on file  Relationships  . Social connections:    Talks on phone: Not on file    Gets together: Not on file    Attends religious service: Not on file    Active member of club or organization: Not on file    Attends meetings of clubs or organizations: Not on file    Relationship status: Not on file  . Intimate partner violence:     Fear of current or ex partner: Not on file    Emotionally abused: Not on file    Physically abused: Not on file    Forced sexual activity: Not on file  Other Topics Concern  . Not on file  Social History Narrative  . Not on file    Outpatient Encounter Medications as of 12/09/2017  Medication Sig  . amLODipine (NORVASC) 5 MG tablet Take 5 mg by mouth daily.  Marland Kitchen aspirin EC 81 MG tablet Take 81 mg by mouth daily.  Marland Kitchen atorvastatin (LIPITOR) 20 MG tablet Take 20 mg by mouth every other day.   . Biotin 5000 MCG CAPS Take 5,000 mcg by mouth daily.   . Cholecalciferol (VITAMIN D3) 2000 UNITS capsule Take 2,000 Units by mouth 2 (two) times daily.   . dorzolamide-timolol (COSOPT) 22.3-6.8 MG/ML ophthalmic solution Place 1 drop into both eyes 2 (two) times daily.   Marland Kitchen glucosamine-chondroitin 500-400 MG tablet Take 1 tablet by mouth 2 (two) times daily.  . isosorbide mononitrate (IMDUR) 60 MG 24 hr tablet Take 60 mg by mouth daily.   . nitroGLYCERIN (NITROSTAT) 0.4 MG SL tablet Place 0.4 mg under the tongue every 5 (five) minutes as needed for chest pain.   Marland Kitchen OMEGA-3 FATTY ACIDS PO Take 1,200 mcg by mouth 2 (two) times daily.   Marland Kitchen omeprazole (PRILOSEC) 20 MG capsule Take 20 mg by mouth daily. Reported on 08/21/2015  . ranitidine (ZANTAC) 150 MG capsule Take 1 capsule (150 mg total) by mouth every evening.  . tamsulosin (FLOMAX) 0.4 MG CAPS capsule TAKE 1 CAPSULE BY MOUTH EVERY DAY  . Travoprost, BAK Free, (TRAVATAN Z) 0.004 % SOLN ophthalmic solution Place 1 drop into both eyes at bedtime.   . valsartan-hydrochlorothiazide (DIOVAN HCT) 320-25 MG per tablet Take 1 tablet by mouth daily.   . vitamin B-12 (CYANOCOBALAMIN) 1000 MCG tablet Take 1,000 mcg by mouth daily.   No facility-administered encounter medications on file as of 12/09/2017.     Allergies  Allergen Reactions  . Penicillins Itching, Swelling and Rash    Has patient had a PCN reaction causing immediate rash, facial/tongue/throat  swelling, SOB or lightheadedness with hypotension: Yes Has patient had a PCN reaction causing severe rash involving mucus membranes or skin necrosis: No Has patient had a PCN reaction that required hospitalization No Has patient had a PCN reaction occurring within the last 10 years: No If all of the above answers are "NO", then may proceed with Cephalosporin use.     Review of Systems  Constitutional: Negative for fever and malaise/fatigue.  Eyes: Negative.   Respiratory: Negative for cough, shortness of breath and wheezing.   Cardiovascular: Negative for chest pain, palpitations, orthopnea, claudication and leg swelling.  Gastrointestinal: Negative.   Skin: Negative.   Neurological: Negative for dizziness and headaches.  Endo/Heme/Allergies: Negative.   Psychiatric/Behavioral: Negative.     Objective:  BP (!) 122/50 (BP Location:  Right Arm, Patient Position: Sitting, Cuff Size: Normal)   Pulse 62   Temp 97.8 F (36.6 C) (Oral)   Resp 16   Wt 171 lb (77.6 kg)   SpO2 99%   BMI 27.60 kg/m   Physical Exam  Constitutional: He is oriented to person, place, and time and well-developed, well-nourished, and in no distress.  HENT:  Head: Normocephalic and atraumatic.  Right Ear: External ear normal.  Left Ear: External ear normal.  Nose: Nose normal.  Eyes: Conjunctivae are normal. No scleral icterus.  Neck: No thyromegaly present.  Cardiovascular: Normal rate, regular rhythm and normal heart sounds.  Pulmonary/Chest: Effort normal and breath sounds normal.  Musculoskeletal: He exhibits no edema.  Trace LE edema.  Lymphadenopathy:    He has no cervical adenopathy.  Neurological: He is alert and oriented to person, place, and time. Gait normal. GCS score is 15.  Skin: Skin is warm and dry.  Psychiatric: Mood, memory, affect and judgment normal.    Assessment and Plan :  Pt medically cleared for cervical spine discectomy and fusion Carotid Disease Pt recently saw Dr  Lucky Cowboy. CAD To be cleared by Dr Nehemiah Massed. HTN HLD LS DDD CKD Repeat CBC/ranal in 6-8 weeks. Hydrate.  I have done the exam and reviewed the chart and it is accurate to the best of my knowledge. Development worker, community has been used and  any errors in dictation or transcription are unintentional. Miguel Aschoff M.D. [facility]

## 2017-12-14 ENCOUNTER — Ambulatory Visit: Payer: PPO | Admitting: Pain Medicine

## 2017-12-14 DIAGNOSIS — I25708 Atherosclerosis of coronary artery bypass graft(s), unspecified, with other forms of angina pectoris: Secondary | ICD-10-CM | POA: Diagnosis not present

## 2017-12-14 DIAGNOSIS — R0602 Shortness of breath: Secondary | ICD-10-CM | POA: Insufficient documentation

## 2017-12-14 DIAGNOSIS — I10 Essential (primary) hypertension: Secondary | ICD-10-CM | POA: Diagnosis not present

## 2017-12-14 DIAGNOSIS — R0609 Other forms of dyspnea: Secondary | ICD-10-CM | POA: Insufficient documentation

## 2017-12-15 NOTE — Progress Notes (Signed)
Patient's Name: Austin [name]  MRN: [id number]  Referring Provider: Jerrol Banana.,*  DOB: Jun 29, 1938  PCP: Jerrol Banana., MD  DOS: 12/16/2017  Note by: Gaspar Cola, MD  Service setting: Ambulatory outpatient  Specialty: Interventional Pain Management  Location: [facility] (AMB) Pain Management Facility    Patient type: Established   Primary Reason(s) for Visit: Evaluation of chronic illnesses with exacerbation, or progression (Level of risk: moderate) CC: Back Pain (lower, right is worse) and Neck Pain (bilateral)  HPI  [name] is a 84 y.o. year old, male patient, who comes today for a follow-up evaluation. He has History of colonic polyps; Benign essential HTN; Benign fibroma of prostate; Atherosclerosis of coronary artery; Cataract; Esophageal stenosis; Acid reflux; Glaucoma; Hemorrhoid; HLD (hyperlipidemia); Obstructive apnea; Borderline diabetes; Esophagogastric ring; Heart valve disease; MI (mitral incompetence); Cardiomyopathy, ischemic; Coronary artery disease involving native coronary artery of native heart with unstable angina pectoris ([place]); Chronic low back pain (Primary Source of Pain) (Bilateral) (R>L); Lumbar spondylosis; Lumbar facet syndrome (Bilateral) (R>L); Peripheral vascular disease ([place]); Lumbar facet hypertrophy (multilevel); Grade 1 Anterolisthesis of L3 over L4; Lumbar foraminal stenosis (Right L2-3; Bilateral L3-4, L4-5); Carotid stenosis; Chronic knee pain (Right); Full thickness rotator cuff tear; Stiffness of shoulder joint; Tear of medial meniscus of knee; Weakness of limb; Osteoarthritis of knee (Right); Chronic pain syndrome; Lumbar central spinal stenosis (Severe at L4-5); Chronic Lumbar radiculitis (intermittent/recurrent) (Bilateral); Easy bruising; DDD (degenerative disc disease), lumbar; Status post right partial knee replacement; Chronic lower extremity pain (Right); Spondylosis without myelopathy or radiculopathy, lumbosacral region; Spinal  stenosis, lumbar region, with neurogenic claudication; SOBOE (shortness of breath on exertion); and Cervical myelopathy ([place]) on their problem list. [name] was last seen on 10/07/2017. His primarily concern today is the Back Pain (lower, right is worse) and Neck Pain (bilateral)  Pain Assessment: Location: Lower, Left, Right(right is worse on the back.  ) Back(neck) Radiating: neck pain is causing tingling in both arms.  back pain goes into the thighs but is more severe on the right  Onset: More than a month ago Duration: Chronic pain Quality: Tingling, Discomfort, Numbness, Burning, Shooting Severity: 5 /10 (subjective, self-reported pain score)  Note: Reported level is compatible with observation.             A 5/10 is viewed as "Severe" and described as intense and extremely unpleasant. Associated with frowning face and frequent crying. Pain overwhelms the senses.  Ability to do any activity or maintain social relationships becomes significantly limited. Conversation becomes difficult. Pacing back and forth is common, as getting into a comfortable position is nearly impossible. Pain wakes you up from deep sleep. Physical signs will be obvious: pupillary dilation; increased sweating; goosebumps; brisk reflexes; cold, clammy hands and feet; nausea, vomiting or dry heaves; loss of appetite; significant sleep disturbance with inability to fall asleep or to remain asleep. When persistent, significant weight loss is observed due to the complete loss of appetite and sleep deprivation.  Blood pressure and heart rate becomes significantly elevated.       When using our objective Pain Scale, levels between 6 and 10/10 are said to belong in an emergency room, as it progressively worsens from a 6/10, described as severely limiting, requiring emergency care not usually available at an outpatient pain management facility. At a 6/10 level, communication becomes difficult and requires great effort. Assistance to reach  the emergency department may be required. Facial flushing and profuse sweating along with potentially dangerous increases  in heart rate and blood pressure will be evident. Effect on ADL: difficult to walk or standing for any length of time.  Timing: Intermittent Modifying factors: medications, tylenol and aleve.   BP: (!) 120/51  HR: (!) 52  Referral to Neurosurgeon - Pending cervical surgery on December 30, 2017, followed 8-10 weeks later by lumbar spine surgery Dillard Essex, MD). Here to see if we can offer him some palliative options to improve his pain until the surgery.  Further details on both, my assessment(s), as well as the proposed treatment plan, please see below.  Laboratory Chemistry  Inflammation Markers (CRP: Acute Phase) (ESR: Chronic Phase) No results found.  Rheumatology Markers No results found.  Renal Function Markers Lab Results  Component Value Date   BUN 22 12/02/2017   CREATININE 1.81 (H) 12/02/2017   BCR 12 12/02/2017   GFRAA 38 (L) 12/02/2017   GFRNONAA 33 (L) 12/02/2017                             Hepatic Function Markers Lab Results  Component Value Date   AST 13 12/02/2017   ALT 11 12/02/2017   ALBUMIN 4.4 12/02/2017   ALKPHOS 61 12/02/2017                        Electrolytes Lab Results  Component Value Date   NA 136 12/02/2017   K 4.2 12/02/2017   CL 102 12/02/2017   CALCIUM 9.3 12/02/2017                        Neuropathy Markers Lab Results  Component Value Date   HGBA1C 5.7 09/29/2016                        Bone Pathology Markers No results found.  Coagulation Parameters Lab Results  Component Value Date   INR 1.03 07/28/2016   LABPROT 13.5 07/28/2016   APTT 41 (H) 07/28/2016   PLT 168 12/02/2017                        Cardiovascular Markers Lab Results  Component Value Date   CKTOTAL 142 01/08/2015   HGB 12.2 (L) 12/02/2017   HCT 37.0 (L) 12/02/2017                         CA Markers No results found.  Note: Lab  results reviewed.  Recent Diagnostic Imaging Review  Cervical Imaging: Cervical MR wo contrast:  Results for orders placed during the hospital encounter of 11/04/17  MR CERVICAL SPINE WO CONTRAST   Narrative CLINICAL DATA:  Neck pain with tingling down both arms extending to the fingers. Symptoms for 2 years, worsened for 2 months.  EXAM: MRI CERVICAL SPINE WITHOUT CONTRAST  TECHNIQUE: Multiplanar, multisequence MR imaging of the cervical spine was performed. No intravenous contrast was administered.  COMPARISON:  None.  FINDINGS: Alignment: Slight anterolisthesis at C4-5 and C7-T1  Vertebrae: No fracture, evidence of discitis, or bone lesion.  Cord: Degenerative cord deformity as described below. No cord signal abnormality.  Posterior Fossa, vertebral arteries, paraspinal tissues: Negative  Disc levels:  C1-2: Retro dental ligamentous thickening without cervicomedullary deformity.  C2-3: Facet and uncovertebral ridging. Shallow central protrusion. Moderate right foraminal narrowing.  C3-4: Disc narrowing with endplate and uncovertebral ridging. Ligamentum flavum thickening. There  is spinal stenosis with cord flattening. No cord signal abnormality. Advanced biforaminal impingement.  C4-5: Degenerative disc narrowing with endplate and uncovertebral ridging. There is a component of disc bulging or protrusion. The ligamentum flavum is thickened. Spinal stenosis with cord flattening. Moderate to advanced right and moderate left foraminal narrowing.  C5-6: Disc narrowing with posterior disc osteophyte complex and ligamentum flavum thickening causing spinal stenosis with cord flattening. There is a superimposed left paracentral to foraminal protrusion with advanced left C6 impingement. Right foraminal narrowing is also advanced.  C6-7: Disc narrowing with uncovertebral spurring. Moderate foraminal impingement bilaterally. Patent spinal canal.  C7-T1:Facet spurring  and disc narrowing with mild right and moderate left foraminal narrowing. Patent spinal canal  IMPRESSION: 1. Disc osteophyte complexes and ligamentum flavum thickening cause spinal stenosis with cord flattening at C3-4 to C5-6. No cord signal abnormality. 2. Foraminal impingement throughout the cervical spine with the most severe stenoses seen bilaterally at C3-4, on the right at C4-5, and bilaterally at C5-6   Electronically Signed   By: Monte Fantasia M.D.   On: 11/04/2017 13:44    Lumbosacral Imaging: Lumbar MR wo contrast:  Results for orders placed during the hospital encounter of 09/18/17  MR LUMBAR SPINE WO CONTRAST   Narrative CLINICAL DATA:  Initial evaluation for chronic low back pain radiating into the buttocks bilaterally, worse on the right.  EXAM: MRI LUMBAR SPINE WITHOUT CONTRAST  TECHNIQUE: Multiplanar, multisequence MR imaging of the lumbar spine was performed. No intravenous contrast was administered.  COMPARISON:  Prior MRI from 12/15/2009.  FINDINGS: Segmentation: Normal segmentation. Lowest well-formed disc labeled the L5-S1 level.  Alignment: 3 mm anterolisthesis of L3 on L4. Vertebral bodies otherwise normally aligned with preservation of the normal lumbar lordosis.  Vertebrae: Vertebral body heights maintained without evidence for acute or chronic fracture. Chronic reactive endplate changes with few scattered endplate Schmorl's nodes present at L2-3 through L5-S1, progressed from previous exam. Underlying bone marrow signal intensity within normal limits. 14 mm stir hyperintense lesion positioned within the central aspect of the L2 vertebral body is stable from previous exam, most consistent with a probable atypical hemangioma. No worrisome osseous lesions identified.  Conus medullaris and cauda equina: Conus extends to the L1 level. Conus and cauda equina appear normal.  Paraspinal and other soft tissues: Paraspinous soft tissues  within normal limits. Few scattered T2 hyperintense cyst noted within the kidneys bilaterally, largest of which is exophytic in nature and measures 3.1 cm, extending from the posterior left kidney. Visualized visceral structures otherwise unremarkable.  Disc levels:  T12-L1: Normal interspace. Mild bilateral facet hypertrophy. No significant canal or foraminal stenosis.  L1-2: Mild diffuse disc bulge. Mild-to-moderate facet and ligament flavum hypertrophy. No significant canal or foraminal stenosis.  L2-3: Diffuse disc bulge with intervertebral disc space narrowing and disc desiccation. Milder reactive endplate changes with marginal endplate osteophytic spurring, most notable posteriorly on the right. Moderate facet and ligament flavum hypertrophy. Resultant fairly severe canal and bilateral subarticular stenosis. Thecal sac measures 6 mm in AP diameter. Mild left with mild to moderate right L2 foraminal stenosis.  L3-4: Anterolisthesis. Chronic diffuse disc bulge with disc desiccation and intervertebral disc space narrowing. Associated chronic reactive endplate changes. Moderate to severe bilateral facet hypertrophy. There is resultant severe canal with bilateral subarticular stenosis. Thecal sac measures 6 mm in AP diameter. Moderate left with moderate to severe right L3 foraminal stenosis.  L4-5: Chronic intervertebral disc space narrowing with diffuse disc bulge, disc desiccation, and reactive endplate changes. Advanced  facet and ligament flavum hypertrophy. There is resultant severe canal and subarticular stenosis, with the thecal sac measuring approximately 5 mm in AP diameter. Moderate to severe left with mild right L4 foraminal stenosis.  L5-S1: Chronic intervertebral disc space narrowing with diffuse disc bulge and disc desiccation. Mild reactive endplate changes with marginal endplate osteophytic spurring. Mild bilateral facet ligament flavum hypertrophy. Resultant  mild bilateral lateral recess narrowing, slightly greater on the right. Moderate bilateral L5 foraminal stenosis.  IMPRESSION: 1. Degenerative disc bulge with facet hypertrophy at L2-3 through L4-5 with resultant severe diffuse spinal stenosis, most severe at the L4-5 level. 2. Multifactorial degenerative changes with resultant multilevel foraminal narrowing as above. Notable findings include moderate right L2 foraminal narrowing, moderate left with moderate to severe right L3 foraminal stenosis, moderate to severe left L4 foraminal narrowing, and moderate bilateral L5 foraminal stenosis.   Electronically Signed   By: Jeannine Boga M.D.   On: 09/18/2017 22:17    Lumbar DG 2-3 views:  Results for orders placed during the hospital encounter of 04/30/15  DG Lumbar Spine 2-3 Views   Narrative CLINICAL DATA:  3-4 year history of low back pain with worsening symptoms over the past 3 months without known injury, no radicular symptoms.  EXAM: LUMBAR SPINE - 2-3 VIEW  COMPARISON:  AP view of the abdomen of April 02, 2010. Report of an MRI of the lumbar spine dated December 15, 2009.  FINDINGS: The lumbar vertebral bodies are preserved in height. There is moderate disc space narrowing at L4-5 and L5-S1 with milder narrowing at L2-3 and at L3-4. There is no spondylolisthesis. There are moderate-sized anterior endplate osteophytes at all lumbar levels. There is a small posterior osteophyte inferiorly at L4. There is facet joint hypertrophy at L4-5 and at L5-S1. The pedicles and transverse processes are intact. The observed portions of the sacrum are intact.  IMPRESSION: There is moderate multilevel degenerative disc space loss centered from L3 inferiorly. There is facet joint hypertrophy at L4-5 and L5-S1. There is no compression fracture.   Electronically Signed   By: David  Martinique M.D.   On: 04/30/2015 10:10    Knee Imaging: Knee-R MR wo contrast:  Results for  orders placed during the hospital encounter of 06/12/16  MR KNEE RIGHT WO CONTRAST   Narrative CLINICAL DATA:  Acute onset sharp right knee pain 3 weeks ago with swelling. Pain is worse with weight-bearing. No known injury.  EXAM: MRI OF THE RIGHT KNEE WITHOUT CONTRAST  TECHNIQUE: Multiplanar, multisequence MR imaging of the knee was performed. No intravenous contrast was administered.  COMPARISON:  None.  FINDINGS: MENISCI  Medial meniscus: Complex tear seen in the posterior horn of the medial meniscus extending into the meniscal body in a horizontal orientation reaching the femoral articular surface. No displaced fragment.  Lateral meniscus:  Degenerative signal without tear is seen.  LIGAMENTS  Cruciates:  Intact.  Collaterals:  Intact.  CARTILAGE  Patellofemoral:  Normal.  Medial: Degenerated with a few subchondral cysts seen in the medial femoral condyle.  Lateral:  Normal.  Joint:  Trace effusion.  Popliteal Fossa: Baker's cyst measures 2.6 cm transverse by 0.8 cm AP by 4.6 cm craniocaudal.  Extensor Mechanism: Intact. There is mild edema in the superior fibers of the patellar tendon. Edema is also seen in the distal fibers the tendon at their attachment to the tibial tuberosity. The tibial tuberosity is fragmented and mildly edematous.  Bones:  No fracture or worrisome lesion.  Other: None.  IMPRESSION:  Complex tear posterior horn medial meniscus extends in the meniscal body in a horizontal orientation reaching the femoral articular surface.  Tendinopathy superior fibers the patellar tendon and Osgood-Schlatter disease.  Moderate medial compartment osteoarthritis.  Small Baker's cyst.   Electronically Signed   By: Inge Rise M.D.   On: 06/12/2016 09:11    Complexity Note: Imaging results reviewed. Results shared with Mr. Jou, using Layman's terms.        Copy of results provided to patient.            Meds   Current  Outpatient Medications:  .  amLODipine (NORVASC) 5 MG tablet, Take 5 mg by mouth daily., Disp: , Rfl:  .  atorvastatin (LIPITOR) 20 MG tablet, Take 20 mg by mouth daily. , Disp: , Rfl:  .  Biotin 5000 MCG CAPS, Take 5,000 mcg by mouth daily. , Disp: , Rfl:  .  Cholecalciferol (VITAMIN D3) 2000 UNITS capsule, Take 2,000 Units by mouth daily. , Disp: , Rfl:  .  dorzolamide-timolol (COSOPT) 22.3-6.8 MG/ML ophthalmic solution, Place 1 drop into both eyes 2 (two) times daily. , Disp: , Rfl:  .  isosorbide mononitrate (IMDUR) 60 MG 24 hr tablet, Take 60 mg by mouth daily. , Disp: , Rfl:  .  nitroGLYCERIN (NITROSTAT) 0.4 MG SL tablet, Place 0.4 mg under the tongue every 5 (five) minutes as needed for chest pain. , Disp: , Rfl:  .  omeprazole (PRILOSEC) 20 MG capsule, Take 20 mg by mouth daily. , Disp: , Rfl:  .  ranitidine (ZANTAC) 150 MG capsule, Take 1 capsule (150 mg total) by mouth every evening., Disp: 30 capsule, Rfl: 5 .  tamsulosin (FLOMAX) 0.4 MG CAPS capsule, TAKE 1 CAPSULE BY MOUTH EVERY DAY, Disp: 30 capsule, Rfl: 11 .  Travoprost, BAK Free, (TRAVATAN Z) 0.004 % SOLN ophthalmic solution, Place 1 drop into both eyes at bedtime. , Disp: , Rfl:  .  valsartan-hydrochlorothiazide (DIOVAN HCT) 320-25 MG per tablet, Take 1 tablet by mouth daily. , Disp: , Rfl:  .  vitamin B-12 (CYANOCOBALAMIN) 1000 MCG tablet, Take 1,000 mcg by mouth daily., Disp: , Rfl:  .  acetaminophen (TYLENOL) 500 MG tablet, Take 1,000 mg by mouth daily as needed for moderate pain., Disp: , Rfl:  .  Coenzyme Q10 (COQ-10) 200 MG CAPS, Take 200 mg by mouth daily., Disp: , Rfl:  .  Melatonin 5 MG CAPS, Take 5 mg by mouth at bedtime as needed (sleep)., Disp: , Rfl:  .  Menthol, Topical Analgesic, (BIOFREEZE EX), Apply 1 application topically daily as needed (back pain)., Disp: , Rfl:  .  methocarbamol (ROBAXIN) 500 MG tablet, Take 1 tablet (500 mg total) by mouth every 6 (six) hours as needed for muscle spasms., Disp: 90 tablet,  Rfl: 0 .  methylPREDNISolone (MEDROL DOSEPAK) 4 MG TBPK tablet, Follow package directions, Disp: 21 tablet, Rfl: 0 .  Misc Natural Products (GLUCOSAMINE CHOND DOUBLE STR) TABS, Take 1 tablet by mouth 2 (two) times daily., Disp: , Rfl:  .  Multiple Vitamins-Minerals (PRESERVISION AREDS 2) CAPS, Take 1 capsule by mouth 2 (two) times daily., Disp: , Rfl:  .  Omega-3 Fatty Acids (FISH OIL) 1200 MG CAPS, Take 1,200 mg by mouth 2 (two) times daily., Disp: , Rfl:  .  oxyCODONE (OXY IR/ROXICODONE) 5 MG immediate release tablet, Take 1 tablet (5 mg total) by mouth every 3 (three) hours as needed for moderate pain ((score 4 to 6))., Disp: 30 tablet, Rfl: 0 .  Polyethyl Glycol-Propyl Glycol (SYSTANE OP), Place 1 drop into the right eye daily as needed (blurriness)., Disp: , Rfl:   ROS  Constitutional: Denies any fever or chills Gastrointestinal: No reported hemesis, hematochezia, vomiting, or acute GI distress Musculoskeletal: Denies any acute onset joint swelling, redness, loss of ROM, or weakness Neurological: No reported episodes of acute onset apraxia, aphasia, dysarthria, agnosia, amnesia, paralysis, loss of coordination, or loss of consciousness  Allergies  Mr. Bolar is allergic to penicillins.  PFSH  Drug: Mr. Vieth  reports that he does not use drugs. Alcohol:  reports that he drinks alcohol. Tobacco:  reports that he has quit smoking. His smoking use included cigarettes. He quit after 15.00 years of use. He has never used smokeless tobacco. Medical:  has a past medical history of Absolute anemia (12/08/2014), Allergy, Arthritis, Bradycardia (05/03/2015), Calculus of kidney (12/08/2014), Coronary artery disease, GERD (gastroesophageal reflux disease), Glaucoma, History of kidney stones, HLD (hyperlipidemia), Hyperglycemia (08/27/2015), Hypertension, Myocardial infarction ([place]) (2011), Peripheral vascular disease ([place]), and VHD (valvular heart disease). Surgical: Mr. Culton  has a past surgical history  that includes Shoulder surgery (Right); Coronary artery bypass graft (2007); Colonoscopy (N/A, 10/02/2014); Tonsillectomy; Hemorroidectomy; Carpal tunnel release (Bilateral); Lumbar epidural injection (2011); Mouth surgery; Cardiac catheterization (2007); Cardiac catheterization (N/A, 04/17/2015); Knee arthroscopy (Right, 08/05/2016); Knee arthroscopy with medial menisectomy (Right, 08/05/2016); Synovectomy (Right, 08/05/2016); Knee arthroscopy with lateral release (Right, 08/05/2016); Joint replacement (Right, 03/13/2018); Eye surgery; Cataract extraction w/ intraocular lens  implant, bilateral; Esophagogastroduodenoscopy; and Anterior cervical decomp/discectomy fusion (N/A, 12/30/2017). Family: family history includes Heart disease in his brother, brother, brother, brother, brother, father, and mother.  Constitutional Exam  General appearance: Well nourished, well developed, and well hydrated. In no apparent acute distress Vitals:   12/16/17 0851  BP: (!) 120/51  Pulse: (!) 52  Resp: 16  Temp: 97.7 F (36.5 C)  TempSrc: Oral  SpO2: 100%  Weight: 170 lb (77.1 kg)  Height: _0  (1.676 m)   BMI Assessment: Estimated body mass index is 27.44 kg/m as calculated from the following:   Height as of this encounter: _1  (1.676 m).   Weight as of this encounter: 170 lb (77.1 kg).  BMI interpretation table: BMI level Category Range association with higher incidence of chronic pain  <18 kg/m2 Underweight   18.5-24.9 kg/m2 Ideal body weight   25-29.9 kg/m2 Overweight Increased incidence by 20%  30-34.9 kg/m2 Obese (Class I) Increased incidence by 68%  35-39.9 kg/m2 Severe obesity (Class II) Increased incidence by 136%  >40 kg/m2 Extreme obesity (Class III) Increased incidence by 254%   Patient's current BMI Ideal Body weight  Body mass index is 27.44 kg/m. Ideal body weight: 63.8 kg (140 lb 10.5 oz) Adjusted ideal body weight: 69.1 kg (152 lb 6.3 oz)   BMI Readings from Last 4 Encounters:  12/30/17  27.76 kg/m  12/23/17 27.76 kg/m  12/22/17 27.44 kg/m  12/16/17 27.44 kg/m   Wt Readings from Last 4 Encounters:  12/30/17 172 lb (78 kg)  12/23/17 172 lb (78 kg)  12/22/17 170 lb (77.1 kg)  12/16/17 170 lb (77.1 kg)  Psych/Mental status: Alert, oriented x 3 (person, place, & time)       Eyes: PERLA Respiratory: No evidence of acute respiratory distress  Cervical Spine Area Exam  Skin & Axial Inspection: No masses, redness, edema, swelling, or associated skin lesions Alignment: Symmetrical Functional ROM: Unrestricted ROM      Stability: No instability detected Muscle Tone/Strength: Functionally intact. No obvious neuro-muscular anomalies  detected. Sensory (Neurological): Unimpaired Palpation: No palpable anomalies              Upper Extremity (UE) Exam    Side: Right upper extremity  Side: Left upper extremity  Skin & Extremity Inspection: Skin color, temperature, and hair growth are WNL. No peripheral edema or cyanosis. No masses, redness, swelling, asymmetry, or associated skin lesions. No contractures.  Skin & Extremity Inspection: Skin color, temperature, and hair growth are WNL. No peripheral edema or cyanosis. No masses, redness, swelling, asymmetry, or associated skin lesions. No contractures.  Functional ROM: Unrestricted ROM          Functional ROM: Unrestricted ROM          Muscle Tone/Strength: Functionally intact. No obvious neuro-muscular anomalies detected.  Muscle Tone/Strength: Functionally intact. No obvious neuro-muscular anomalies detected.  Sensory (Neurological): Unimpaired          Sensory (Neurological): Unimpaired          Palpation: No palpable anomalies              Palpation: No palpable anomalies              Provocative Test(s):  Phalen's test: deferred Tinel's test: deferred Apley's scratch test (touch opposite shoulder):  Action 1 (Across chest): deferred Action 2 (Overhead): deferred Action 3 (LB reach): deferred   Provocative Test(s):   Phalen's test: deferred Tinel's test: deferred Apley's scratch test (touch opposite shoulder):  Action 1 (Across chest): deferred Action 2 (Overhead): deferred Action 3 (LB reach): deferred    Thoracic Spine Area Exam  Skin & Axial Inspection: No masses, redness, or swelling Alignment: Symmetrical Functional ROM: Unrestricted ROM Stability: No instability detected Muscle Tone/Strength: Functionally intact. No obvious neuro-muscular anomalies detected. Sensory (Neurological): Unimpaired Muscle strength & Tone: No palpable anomalies  Lumbar Spine Area Exam  Skin & Axial Inspection: No masses, redness, or swelling Alignment: Symmetrical Functional ROM: Minimal ROM       Stability: No instability detected Muscle Tone/Strength: Increased muscle tone over affected area Sensory (Neurological): Movement-associated pain Palpation: Complains of area being tender to palpation       Provocative Tests: Lumbar Hyperextension/rotation test: deferred today       Lumbar quadrant test (Kemp's test): deferred today       Lumbar Lateral bending test: deferred today       Patrick's Maneuver: deferred today                   FABER test: deferred today                   Thigh-thrust test: deferred today       S-I compression test: deferred today       S-I distraction test: deferred today        Gait & Posture Assessment  Ambulation: Unassisted Gait: Antalgic Posture: Difficulty standing up straight, due to pain   Lower Extremity Exam    Side: Right lower extremity  Side: Left lower extremity  Stability: No instability observed          Stability: No instability observed          Skin & Extremity Inspection: Skin color, temperature, and hair growth are WNL. No peripheral edema or cyanosis. No masses, redness, swelling, asymmetry, or associated skin lesions. No contractures.  Skin & Extremity Inspection: Skin color, temperature, and hair growth are WNL. No peripheral edema or cyanosis. No masses,  redness, swelling, asymmetry, or associated skin lesions. No contractures.  Functional ROM: Unrestricted ROM                  Functional ROM: Unrestricted ROM                  Muscle Tone/Strength: Functionally intact. No obvious neuro-muscular anomalies detected.  Muscle Tone/Strength: Functionally intact. No obvious neuro-muscular anomalies detected.  Sensory (Neurological): Unimpaired  Sensory (Neurological): Unimpaired  Palpation: No palpable anomalies  Palpation: No palpable anomalies   Assessment  Primary Diagnosis & Pertinent Problem List: The primary encounter diagnosis was Chronic pain syndrome. Diagnoses of Chronic low back pain (Primary Source of Pain) (Bilateral) (R>L), Chronic lower extremity pain (Right), Chronic Lumbar radiculitis (intermittent/recurrent) (Bilateral), Lumbar central spinal stenosis (Severe at L4-5), Lumbar foraminal stenosis (Right L2-3; Bilateral L3-4, L4-5), and DDD (degenerative disc disease), lumbar were also pertinent to this visit.  Status Diagnosis  Persistent Worsening Worsening 1. Chronic pain syndrome   2. Chronic low back pain (Primary Source of Pain) (Bilateral) (R>L)   3. Chronic lower extremity pain (Right)   4. Chronic Lumbar radiculitis (intermittent/recurrent) (Bilateral)   5. Lumbar central spinal stenosis (Severe at L4-5)   6. Lumbar foraminal stenosis (Right L2-3; Bilateral L3-4, L4-5)   7. DDD (degenerative disc disease), lumbar     Problems updated and reviewed during this visit: No problems updated. Plan of Care  Pharmacotherapy (Medications Ordered): No orders of the defined types were placed in this encounter.  Medications administered today: Tarique B. Fuerte "Mikki Santee" had no medications administered during this visit.   Procedure Orders     Lumbar Transforaminal Epidural Lab Orders  No laboratory test(s) ordered today   Imaging Orders  No imaging studies ordered today   Referral Orders  No referral(s) requested today     Interventional management options: Planned, scheduled, and/or pending:   Diagnostic/therapeutic bilateral L2 TFESI #1 under fluoro, no sedation.   Considering:   Palliative right-sided L3-4 LESI #4 Palliative right-sided L4-5LESI #2 Palliative bilateral lumbar facet block #3.  Diagnostic right-sided L2-3 transforaminalESI+ bilateral L3-4 and/or L4-5transforaminalESI   Palliative PRN treatment(s):   Palliative right-sided L3-4 LESI #4 Palliative right-sided L4-5LESI #2 Palliative bilateral lumbar facet block #3.   Provider-requested follow-up: Return for Procedure (no sedation): (B) L2 TFESI #1.  Future Appointments  Date Time Provider [place]  01/11/2018 11:15 AM Milinda Pointer, MD [facility] None  02/22/2018  9:20 AM Jerrol Banana., MD [facility] None  06/01/2018  1:00 PM [facility] 1 [facility] None  06/01/2018  2:00 PM Dew, Erskine Squibb, MD [facility] None   Primary Care Physician: Jerrol Banana., MD Location: [facility] Facility Note by: Gaspar Cola, MD Date: 12/16/2017; Time: 10:02 AM

## 2017-12-16 ENCOUNTER — Encounter: Payer: Self-pay | Admitting: Pain Medicine

## 2017-12-16 ENCOUNTER — Ambulatory Visit: Payer: PPO | Attending: Pain Medicine | Admitting: Pain Medicine

## 2017-12-16 VITALS — BP 120/51 | HR 52 | Temp 97.7°F | Resp 16 | Ht 66.0 in | Wt 170.0 lb

## 2017-12-16 DIAGNOSIS — M47897 Other spondylosis, lumbosacral region: Secondary | ICD-10-CM | POA: Diagnosis not present

## 2017-12-16 DIAGNOSIS — Z87891 Personal history of nicotine dependence: Secondary | ICD-10-CM | POA: Insufficient documentation

## 2017-12-16 DIAGNOSIS — G8929 Other chronic pain: Secondary | ICD-10-CM | POA: Diagnosis not present

## 2017-12-16 DIAGNOSIS — G894 Chronic pain syndrome: Secondary | ICD-10-CM

## 2017-12-16 DIAGNOSIS — M1711 Unilateral primary osteoarthritis, right knee: Secondary | ICD-10-CM | POA: Diagnosis not present

## 2017-12-16 DIAGNOSIS — M9983 Other biomechanical lesions of lumbar region: Secondary | ICD-10-CM

## 2017-12-16 DIAGNOSIS — M79604 Pain in right leg: Secondary | ICD-10-CM | POA: Diagnosis not present

## 2017-12-16 DIAGNOSIS — M48061 Spinal stenosis, lumbar region without neurogenic claudication: Secondary | ICD-10-CM | POA: Insufficient documentation

## 2017-12-16 DIAGNOSIS — K219 Gastro-esophageal reflux disease without esophagitis: Secondary | ICD-10-CM | POA: Diagnosis not present

## 2017-12-16 DIAGNOSIS — Z880 Allergy status to penicillin: Secondary | ICD-10-CM | POA: Insufficient documentation

## 2017-12-16 DIAGNOSIS — M545 Low back pain, unspecified: Secondary | ICD-10-CM

## 2017-12-16 DIAGNOSIS — K222 Esophageal obstruction: Secondary | ICD-10-CM | POA: Diagnosis not present

## 2017-12-16 DIAGNOSIS — I10 Essential (primary) hypertension: Secondary | ICD-10-CM | POA: Insufficient documentation

## 2017-12-16 DIAGNOSIS — I25110 Atherosclerotic heart disease of native coronary artery with unstable angina pectoris: Secondary | ICD-10-CM | POA: Insufficient documentation

## 2017-12-16 DIAGNOSIS — G4733 Obstructive sleep apnea (adult) (pediatric): Secondary | ICD-10-CM | POA: Insufficient documentation

## 2017-12-16 DIAGNOSIS — M5416 Radiculopathy, lumbar region: Secondary | ICD-10-CM | POA: Diagnosis not present

## 2017-12-16 DIAGNOSIS — Z79899 Other long term (current) drug therapy: Secondary | ICD-10-CM | POA: Diagnosis not present

## 2017-12-16 DIAGNOSIS — M5116 Intervertebral disc disorders with radiculopathy, lumbar region: Secondary | ICD-10-CM | POA: Diagnosis not present

## 2017-12-16 DIAGNOSIS — I739 Peripheral vascular disease, unspecified: Secondary | ICD-10-CM | POA: Diagnosis not present

## 2017-12-16 DIAGNOSIS — M8938 Hypertrophy of bone, other site: Secondary | ICD-10-CM | POA: Diagnosis not present

## 2017-12-16 DIAGNOSIS — E785 Hyperlipidemia, unspecified: Secondary | ICD-10-CM | POA: Diagnosis not present

## 2017-12-16 DIAGNOSIS — M5136 Other intervertebral disc degeneration, lumbar region: Secondary | ICD-10-CM | POA: Diagnosis not present

## 2017-12-16 DIAGNOSIS — I6529 Occlusion and stenosis of unspecified carotid artery: Secondary | ICD-10-CM | POA: Insufficient documentation

## 2017-12-16 DIAGNOSIS — I255 Ischemic cardiomyopathy: Secondary | ICD-10-CM | POA: Diagnosis not present

## 2017-12-16 DIAGNOSIS — Z79891 Long term (current) use of opiate analgesic: Secondary | ICD-10-CM | POA: Diagnosis not present

## 2017-12-16 DIAGNOSIS — M542 Cervicalgia: Secondary | ICD-10-CM | POA: Insufficient documentation

## 2017-12-16 DIAGNOSIS — M48062 Spinal stenosis, lumbar region with neurogenic claudication: Secondary | ICD-10-CM | POA: Diagnosis not present

## 2017-12-16 DIAGNOSIS — I252 Old myocardial infarction: Secondary | ICD-10-CM | POA: Diagnosis not present

## 2017-12-16 NOTE — Patient Instructions (Addendum)
____________________________________________________________________________________________  Preparing for your procedure (without sedation)  Instructions: . Oral Intake: Do not eat or drink anything for at least 3 hours prior to your procedure. . Transportation: Unless otherwise stated by your physician, you may drive yourself after the procedure. . Blood Pressure Medicine: Take your blood pressure medicine with a sip of water the morning of the procedure. . Blood thinners:  . Diabetics on insulin: Notify the staff so that you can be scheduled 1st case in the morning. If your diabetes requires high dose insulin, take only  of your normal insulin dose the morning of the procedure and notify the staff that you have done so. . Preventing infections: Shower with an antibacterial soap the morning of your procedure.  . Build-up your immune system: Take 1000 mg of Vitamin C with every meal (3 times a day) the day prior to your procedure. Marland Kitchen Antibiotics: Inform the staff if you have a condition or reason that requires you to take antibiotics before dental procedures. . Pregnancy: If you are pregnant, call and cancel the procedure. . Sickness: If you have a cold, fever, or any active infections, call and cancel the procedure. . Arrival: You must be in the facility at least 30 minutes prior to your scheduled procedure. . Children: Do not bring any children with you. . Dress appropriately: Bring dark clothing that you would not mind if they get stained. . Valuables: Do not bring any jewelry or valuables.  Procedure appointments are reserved for interventional treatments only. Marland Kitchen No Prescription Refills. . No medication changes will be discussed during procedure appointments. . No disability issues will be discussed.  Remember:  Regular Business hours are:  Monday to Thursday 8:00 AM to 4:00 PM  Provider's Schedule: Milinda Pointer, MD:  Procedure days: Tuesday and Thursday 7:30 AM to 4:00  PM  Gillis Santa, MD:  Procedure days: Monday and Wednesday 7:30 AM to 4:00 PM ____________________________________________________________________________________________   Post-op Pain Management on a Chronic Pain Patient  Why should the surgeon manage his patient's post-op pain? The Surgeon is uniquely qualified to determine the amount of post-operative pain to be expected on a procedure or surgery that he/she has performed. Even with similar surgeries, the surgeon's perspective on expected pain is unique, since he/she performed the procedure and knows the degree of difficulty and/or tissue damage involved in each particular case. The surgeon is also up to date on events such as blood loss, intraoperative complications, and PO (per orum) status that may influence not only the patient's dose and schedule, but route of administration as well.  How about telling chronic pain patients to just double up or increase their usual pain medication intake to compensate for the increased pain? This is a bad idea since it will lead to the patient running out of his/her usual medications early and this may create a problem at the level of the insurance, which supplies medications based on the amount and schedule stated on the prescription. Running out early may trigger an event where the refill is denied by the insurance company and/or pharmacy. In addition, this practice provides a very poor paper trail as to why this patient ran out of medication early. In addition, from the perspective of the pain physician, it creates a nightmare in the accounting of the patient's medication.  So, what should I do as a Psychologist, sport and exercise when confronted with a patient that needs surgery and already takes a significant amount of pain medicine for their chronic pain, which may or  may not be related to the surgery I have to perform?  This is what the surgeon should do: 1. Do not change the dose or schedule of the pain medications  prescribed by the pain specialist. This medication regimen allows for the patient's chronic pain to be under control, so as to bring that patient down to the level of an average individual. 2. Have the patient continue their usual pain management regimen, without any alterations. In addition, manage the post-operative pain as you would on any other "narcotic naive" patient. Do not attempt to compensate for tolerance. This is what the patient's usual regimen will do for you. Simply treat the patient as if they had no chronic pain and as if they were taking no other pain medications. 3. Talk to the patient about the medication, just like you would for anyone else. Do not assume that they are experts in opioids. Make sure you let the patient know that the medication is to be used only if absolutely necessary. (PRN) 4. Prescribe the medication for as long as you would on any other patient undergoing the same type of surgery. Prescribe for the same average amount of time that you would on any other patient. Avoid prescribing for longer periods. 5. Send [place] a copy of the operative report with information about your choice of the post-op pain medication provided. 6. Keep [place] informed of any complications that may prolong the average duration patient's post-op pain.  If you have any questions, please feel to contact us at [phone]. _____________________________________________________________________________________________

## 2017-12-22 ENCOUNTER — Ambulatory Visit (HOSPITAL_BASED_OUTPATIENT_CLINIC_OR_DEPARTMENT_OTHER): Payer: PPO | Admitting: Pain Medicine

## 2017-12-22 ENCOUNTER — Other Ambulatory Visit: Payer: Self-pay

## 2017-12-22 ENCOUNTER — Encounter: Payer: Self-pay | Admitting: Pain Medicine

## 2017-12-22 ENCOUNTER — Ambulatory Visit
Admission: RE | Admit: 2017-12-22 | Discharge: 2017-12-22 | Disposition: A | Discharge status: Home / Self Care | Payer: PPO | Source: Ambulatory Visit | Attending: Pain Medicine | Admitting: Pain Medicine

## 2017-12-22 VITALS — BP 149/66 | HR 59 | Temp 98.4°F | Resp 18 | Ht 66.0 in | Wt 170.0 lb

## 2017-12-22 DIAGNOSIS — G8929 Other chronic pain: Secondary | ICD-10-CM | POA: Insufficient documentation

## 2017-12-22 DIAGNOSIS — M48061 Spinal stenosis, lumbar region without neurogenic claudication: Secondary | ICD-10-CM | POA: Diagnosis not present

## 2017-12-22 DIAGNOSIS — Z7982 Long term (current) use of aspirin: Secondary | ICD-10-CM | POA: Diagnosis not present

## 2017-12-22 DIAGNOSIS — M5116 Intervertebral disc disorders with radiculopathy, lumbar region: Secondary | ICD-10-CM | POA: Insufficient documentation

## 2017-12-22 DIAGNOSIS — M5416 Radiculopathy, lumbar region: Secondary | ICD-10-CM

## 2017-12-22 DIAGNOSIS — Z79899 Other long term (current) drug therapy: Secondary | ICD-10-CM | POA: Insufficient documentation

## 2017-12-22 DIAGNOSIS — M5136 Other intervertebral disc degeneration, lumbar region: Secondary | ICD-10-CM

## 2017-12-22 DIAGNOSIS — M545 Low back pain: Secondary | ICD-10-CM

## 2017-12-22 DIAGNOSIS — Z880 Allergy status to penicillin: Secondary | ICD-10-CM | POA: Diagnosis not present

## 2017-12-22 MED ORDER — LIDOCAINE HCL 2 % IJ SOLN
20.0000 mL | Freq: Once | INTRAMUSCULAR | Status: AC
  Administered 2017-12-22: 400 mg
  Filled 2017-12-22: qty 40

## 2017-12-22 MED ORDER — DEXAMETHASONE SODIUM PHOSPHATE 10 MG/ML IJ SOLN
10.0000 mg | Freq: Once | INTRAMUSCULAR | Status: AC
  Administered 2017-12-22: 10 mg
  Filled 2017-12-22: qty 1

## 2017-12-22 MED ORDER — ROPIVACAINE HCL 2 MG/ML IJ SOLN
1.0000 mL | Freq: Once | INTRAMUSCULAR | Status: DC
  Filled 2017-12-22: qty 10

## 2017-12-22 MED ORDER — ROPIVACAINE HCL 2 MG/ML IJ SOLN
1.0000 mL | Freq: Once | INTRAMUSCULAR | Status: AC
  Administered 2017-12-22: 2 mL via EPIDURAL
  Filled 2017-12-22: qty 10

## 2017-12-22 MED ORDER — SODIUM CHLORIDE 0.9 % IJ SOLN
INTRAMUSCULAR | Status: AC
  Filled 2017-12-22: qty 10

## 2017-12-22 MED ORDER — SODIUM CHLORIDE 0.9% FLUSH
1.0000 mL | Freq: Once | INTRAVENOUS | Status: AC
  Administered 2017-12-22: 2 mL

## 2017-12-22 MED ORDER — IOPAMIDOL (ISOVUE-M 200) INJECTION 41%
10.0000 mL | Freq: Once | INTRAMUSCULAR | Status: AC
  Administered 2017-12-22: 10 mL via EPIDURAL
  Filled 2017-12-22: qty 10

## 2017-12-22 MED ORDER — SODIUM CHLORIDE 0.9% FLUSH: 1.0000 mL | Freq: Once | INTRAVENOUS | Status: DC

## 2017-12-22 NOTE — Progress Notes (Signed)
Patient's Name: Austin [name]  MRN: [id number]  Referring Provider: Jerrol Banana.,*  DOB: 08/30/1928  PCP: Jerrol Banana., MD  DOS: 12/22/2017  Note by: Gaspar Cola, MD  Service setting: Ambulatory outpatient  Specialty: Interventional Pain Management  Patient type: Established  Location: [facility] (AMB) Pain Management Facility  Visit type: Interventional Procedure   Primary Reason for Visit: Interventional Pain Management Treatment. CC: Back Pain (lower)  Procedure:          Anesthesia, Analgesia, Anxiolysis:  Type: Trans-Foraminal Epidural Steroid Injection Purpose: Diagnostic/Therapeutic Region: Posterolateral Lumbosacral Level: L2 Level Laterality: Bilateral Paravertebral Target Area: The 6 o'clock position under the pedicle, on the affected side. Approach: Posterior Percutaneous Paravertebral approach. Position: Prone  Type: Local Anesthesia Indication(s): Analgesia         Route: Infiltration ([place]/IM) IV Access: Declined Sedation: Declined  Local Anesthetic: Lidocaine 1-2%   Indications: 1. DDD (degenerative disc disease), lumbar   2. Chronic Lumbar radiculitis (intermittent/recurrent) (Bilateral)   3. Lumbar foraminal stenosis (Right L2-3; Bilateral L3-4, L4-5)   4. Chronic low back pain (Primary Source of Pain) (Bilateral) (R>L)    Pain Score: Pre-procedure: 6 /10 Post-procedure: 1 /10  Pre-op Assessment:  Austin [name] is a 84 y.o. (year old), male patient, seen today for interventional treatment. He  has a past surgical history that includes Shoulder surgery (Right); Coronary artery bypass graft (2007); Colonoscopy (N/A, 10/02/2014); Tonsillectomy; Hemorroidectomy; Carpal tunnel release (Bilateral); Lumbar epidural injection (2011); Mouth surgery; Cardiac catheterization (2007); Cardiac catheterization (N/A, 04/17/2015); Knee arthroscopy (Right, 08/05/2016); Knee arthroscopy with medial menisectomy (Right, 08/05/2016); Synovectomy (Right, 08/05/2016); Knee  arthroscopy with lateral release (Right, 08/05/2016); and Joint replacement (Right, 03/13/2018). Austin [name] has a current medication list which includes the following prescription(s): acetaminophen, amlodipine, aspirin ec, atorvastatin, biotin, vitamin d3, coq-10, dorzolamide-timolol, isosorbide mononitrate, melatonin, menthol (topical analgesic), glucosamine chond double str, preservision areds 2, naproxen sodium, nitroglycerin, fish oil, omeprazole, polyethyl glycol-propyl glycol, ranitidine, tamsulosin, travoprost (bak free), valsartan-hydrochlorothiazide, and vitamin b-12, and the following Facility-Administered Medications: ropivacaine (pf) 2 mg/ml (0.2%) and sodium chloride flush. His primarily concern today is the Back Pain (lower)  Initial Vital Signs:  Pulse/HCG Rate: (!) 59ECG Heart Rate: 63 Temp: 98.4 F (36.9 C) Resp: 16 BP: (!) 120/55 SpO2: 100 %  BMI: Estimated body mass index is 27.44 kg/m as calculated from the following:   Height as of this encounter: 5\' 6"  (1.676 m).   Weight as of this encounter: 170 lb (77.1 kg).  Risk Assessment: Allergies: Reviewed. He is allergic to penicillins.  Allergy Precautions: None required Coagulopathies: Reviewed. None identified.  Blood-thinner therapy: None at this time Active Infection(s): Reviewed. None identified. Austin [name] is afebrile  Site Confirmation: Austin [name] was asked to confirm the procedure and laterality before marking the site Procedure checklist: Completed Consent: Before the procedure and under the influence of no sedative(s), amnesic(s), or anxiolytics, the patient was informed of the treatment options, risks and possible complications. To fulfill our ethical and legal obligations, as recommended by the American Medical Association's Code of Ethics, I have informed the patient of my clinical impression; the nature and purpose of the treatment or procedure; the risks, benefits, and possible complications of the intervention;  the alternatives, including doing nothing; the risk(s) and benefit(s) of the alternative treatment(s) or procedure(s); and the risk(s) and benefit(s) of doing nothing. The patient was provided information about the general risks and possible complications associated with the procedure. These may include, but are not limited to:  failure to achieve desired goals, infection, bleeding, organ or nerve damage, allergic reactions, paralysis, and death. In addition, the patient was informed of those risks and complications associated to Spine-related procedures, such as failure to decrease pain; infection (i.e.: Meningitis, epidural or intraspinal abscess); bleeding (i.e.: epidural hematoma, subarachnoid hemorrhage, or any other type of intraspinal or peri-dural bleeding); organ or nerve damage (i.e.: Any type of peripheral nerve, nerve root, or spinal cord injury) with subsequent damage to sensory, motor, and/or autonomic systems, resulting in permanent pain, numbness, and/or weakness of one or several areas of the body; allergic reactions; (i.e.: anaphylactic reaction); and/or death. Furthermore, the patient was informed of those risks and complications associated with the medications. These include, but are not limited to: allergic reactions (i.e.: anaphylactic or anaphylactoid reaction(s)); adrenal axis suppression; blood sugar elevation that in diabetics may result in ketoacidosis or comma; water retention that in patients with history of congestive heart failure may result in shortness of breath, pulmonary edema, and decompensation with resultant heart failure; weight gain; swelling or edema; medication-induced neural toxicity; particulate matter embolism and blood vessel occlusion with resultant organ, and/or nervous system infarction; and/or aseptic necrosis of one or more joints. Finally, the patient was informed that Medicine is not an exact science; therefore, there is also the possibility of unforeseen or  unpredictable risks and/or possible complications that may result in a catastrophic outcome. The patient indicated having understood very clearly. We have given the patient no guarantees and we have made no promises. Enough time was given to the patient to ask questions, all of which were answered to the patient's satisfaction. Austin [name] has indicated that he wanted to continue with the procedure. Attestation: I, the ordering provider, attest that I have discussed with the patient the benefits, risks, side-effects, alternatives, likelihood of achieving goals, and potential problems during recovery for the procedure that I have provided informed consent. Date  Time: 12/22/2017 10:24 AM  Pre-Procedure Preparation:  Monitoring: As per clinic protocol. Respiration, ETCO2, SpO2, BP, heart rate and rhythm monitor placed and checked for adequate function Safety Precautions: Patient was assessed for positional comfort and pressure points before starting the procedure. Time-out: I initiated and conducted the "Time-out" before starting the procedure, as per protocol. The patient was asked to participate by confirming the accuracy of the "Time Out" information. Verification of the correct person, site, and procedure were performed and confirmed by me, the nursing staff, and the patient. "Time-out" conducted as per Joint Commission's Universal Protocol (UP.01.01.01). Time: 1109  Description of Procedure:          Area Prepped: Entire Posterior Lumbosacral Area Prepping solution: ChloraPrep (2% chlorhexidine gluconate and 70% isopropyl alcohol) Safety Precautions: Aspiration looking for blood return was conducted prior to all injections. At no point did we inject any substances, as a needle was being advanced. No attempts were made at seeking any paresthesias. Safe injection practices and needle disposal techniques used. Medications properly checked for expiration dates. SDV (single dose vial) medications  used. Description of the Procedure: Protocol guidelines were followed. The patient was placed in position over the procedure table. The target area was identified and the area prepped in the usual manner. Skin & deeper tissues infiltrated with local anesthetic. Appropriate amount of time allowed to pass for local anesthetics to take effect. The procedure needles were then advanced to the target area. Proper needle placement secured. Negative aspiration confirmed. Solution injected in intermittent fashion, asking for systemic symptoms every 0.5cc of injectate. The needles were  then removed and the area cleansed, making sure to leave some of the prepping solution back to take advantage of its long term bactericidal properties. Vitals:   12/22/17 1109 12/22/17 1114 12/22/17 1118 12/22/17 1122  BP: 139/62 (!) 152/67 (!) 144/62 (!) 149/66  Pulse:      Resp: 17 (!) 22 17 18   Temp:      TempSrc:      SpO2: 99% 94% 94% 100%  Weight:      Height:        Start Time: 1109 hrs. End Time: 1120 hrs. Materials:  Needle(s) Type: Regular needle Gauge: 22G Length: 3.5-in Medication(s): Please see orders for medications and dosing details.  Imaging Guidance (Spinal):          Type of Imaging Technique: Fluoroscopy Guidance (Spinal) Indication(s): Assistance in needle guidance and placement for procedures requiring needle placement in or near specific anatomical locations not easily accessible without such assistance. Exposure Time: Please see nurses notes. Contrast: Before injecting any contrast, we confirmed that the patient did not have an allergy to iodine, shellfish, or radiological contrast. Once satisfactory needle placement was completed at the desired level, radiological contrast was injected. Contrast injected under live fluoroscopy. No contrast complications. See chart for type and volume of contrast used. Fluoroscopic Guidance: I was personally present during the use of fluoroscopy. "Tunnel Vision  Technique" used to obtain the best possible view of the target area. Parallax error corrected before commencing the procedure. "Direction-depth-direction" technique used to introduce the needle under continuous pulsed fluoroscopy. Once target was reached, antero-posterior, oblique, and lateral fluoroscopic projection used confirm needle placement in all planes. Images permanently stored in EMR. Interpretation: I personally interpreted the imaging intraoperatively. Adequate needle placement confirmed in multiple planes. Appropriate spread of contrast into desired area was observed. No evidence of afferent or efferent intravascular uptake. No intrathecal or subarachnoid spread observed. Permanent images saved into the patient's record.  Antibiotic Prophylaxis:   Anti-infectives (From admission, onward)   None     Indication(s): None identified  Post-operative Assessment:  Post-procedure Vital Signs:  Pulse/HCG Rate: (!) 5965 Temp: 98.4 F (36.9 C) Resp: 18 BP: (!) 149/66 SpO2: 100 %  EBL: None  Complications: No immediate post-treatment complications observed by team, or reported by patient.  Note: The patient tolerated the entire procedure well. A repeat set of vitals were taken after the procedure and the patient was kept under observation following institutional policy, for this type of procedure. Post-procedural neurological assessment was performed, showing return to baseline, prior to discharge. The patient was provided with post-procedure discharge instructions, including a section on how to identify potential problems. Should any problems arise concerning this procedure, the patient was given instructions to immediately contact us, at any time, without hesitation. In any case, we plan to contact the patient by telephone for a follow-up status report regarding this interventional procedure.  Comments:  No additional relevant information.  Plan of Care    Imaging Orders     DG  C-Arm 1-60 Min-No Report  Procedure Orders     Lumbar Transforaminal Epidural  Medications ordered for procedure: Meds ordered this encounter  Medications  . iopamidol (ISOVUE-M) 41 % intrathecal injection 10 mL    Must be Myelogram-compatible. If not available, you may substitute with a water-soluble, non-ionic, hypoallergenic, myelogram-compatible radiological contrast medium.  Marland Kitchen lidocaine (XYLOCAINE) 2 % (with pres) injection 400 mg  . sodium chloride flush (NS) 0.9 % injection 1 mL  . ropivacaine (PF) 2 mg/mL (  0.2%) (NAROPIN) injection 1 mL  . dexamethasone (DECADRON) injection 10 mg  . sodium chloride flush (NS) 0.9 % injection 1 mL  . ropivacaine (PF) 2 mg/mL (0.2%) (NAROPIN) injection 1 mL  . dexamethasone (DECADRON) injection 10 mg   Medications administered: We administered iopamidol, lidocaine, dexamethasone, sodium chloride flush, ropivacaine (PF) 2 mg/mL (0.2%), and dexamethasone.  See the medical record for exact dosing, route, and time of administration.  New Prescriptions   No medications on file   Disposition: Discharge home  Discharge Date & Time: 12/22/2017; 1129 hrs.   Physician-requested Follow-up: Return for post-procedure eval (2 wks), w/ Dr. Dossie Arbour.  Future Appointments  Date Time Provider [place]  12/23/2017 10:30 AM [facility] PAT1 [facility] None  01/11/2018 11:15 AM Milinda Pointer, MD [facility] None  02/22/2018  9:20 AM Jerrol Banana., MD [facility] None  06/01/2018  1:00 PM [facility] 1 [facility] None  06/01/2018  2:00 PM Dew, Erskine Squibb, MD [facility] None   Primary Care Physician: Jerrol Banana., MD Location: [facility] Facility Note by: Gaspar Cola, MD Date: 12/22/2017; Time: 12:25 PM  Disclaimer:  Medicine is not an exact science. The only guarantee in medicine is that nothing is guaranteed. It is important to note that the decision to proceed with this intervention was based on the information  collected from the patient. The Data and conclusions were drawn from the patient's questionnaire, the interview, and the physical examination. Because the information was provided in large part by the patient, it cannot be guaranteed that it has not been purposely or unconsciously manipulated. Every effort has been made to obtain as much relevant data as possible for this evaluation. It is important to note that the conclusions that lead to this procedure are derived in large part from the available data. Always take into account that the treatment will also be dependent on availability of resources and existing treatment guidelines, considered by other Pain Management Practitioners as being common knowledge and practice, at the time of the intervention. For Medico-Legal purposes, it is also important to point out that variation in procedural techniques and pharmacological choices are the acceptable norm. The indications, contraindications, technique, and results of the above procedure should only be interpreted and judged by a Board-Certified Interventional Pain Specialist with extensive familiarity and expertise in the same exact procedure and technique.

## 2017-12-22 NOTE — Patient Instructions (Signed)

## 2017-12-22 NOTE — Progress Notes (Signed)
Safety precautions to be maintained throughout the outpatient stay will include: orient to surroundings, keep bed in low position, maintain call bell within reach at all times, provide assistance with transfer out of bed and ambulation.  

## 2017-12-23 ENCOUNTER — Encounter
Admission: RE | Admit: 2017-12-23 | Discharge: 2017-12-23 | Disposition: A | Discharge status: Home / Self Care | Payer: PPO | Source: Ambulatory Visit | Attending: Neurosurgery | Admitting: Neurosurgery

## 2017-12-23 ENCOUNTER — Telehealth: Payer: Self-pay | Admitting: *Deleted

## 2017-12-23 ENCOUNTER — Other Ambulatory Visit: Payer: PPO

## 2017-12-23 ENCOUNTER — Other Ambulatory Visit: Payer: Self-pay

## 2017-12-23 DIAGNOSIS — I252 Old myocardial infarction: Secondary | ICD-10-CM | POA: Diagnosis not present

## 2017-12-23 DIAGNOSIS — E785 Hyperlipidemia, unspecified: Secondary | ICD-10-CM | POA: Diagnosis not present

## 2017-12-23 DIAGNOSIS — I739 Peripheral vascular disease, unspecified: Secondary | ICD-10-CM | POA: Diagnosis not present

## 2017-12-23 DIAGNOSIS — I2510 Atherosclerotic heart disease of native coronary artery without angina pectoris: Secondary | ICD-10-CM | POA: Insufficient documentation

## 2017-12-23 DIAGNOSIS — I10 Essential (primary) hypertension: Secondary | ICD-10-CM | POA: Insufficient documentation

## 2017-12-23 DIAGNOSIS — R739 Hyperglycemia, unspecified: Secondary | ICD-10-CM | POA: Insufficient documentation

## 2017-12-23 DIAGNOSIS — Z01812 Encounter for preprocedural laboratory examination: Secondary | ICD-10-CM | POA: Insufficient documentation

## 2017-12-23 HISTORY — DX: Unspecified glaucoma: H40.9

## 2017-12-23 LAB — URINALYSIS, ROUTINE W REFLEX MICROSCOPIC
Bilirubin Urine: NEGATIVE
Glucose, UA: NEGATIVE mg/dL
Hgb urine dipstick: NEGATIVE
Ketones, ur: NEGATIVE mg/dL
LEUKOCYTES UA: NEGATIVE
Nitrite: NEGATIVE
PROTEIN: NEGATIVE mg/dL
SPECIFIC GRAVITY, URINE: 1.016 (ref 1.005–1.030)
pH: 5 (ref 5.0–8.0)

## 2017-12-23 LAB — SURGICAL PCR SCREEN
MRSA, PCR: NEGATIVE
STAPHYLOCOCCUS AUREUS: NEGATIVE

## 2017-12-23 LAB — PROTIME-INR
INR: 1.1
Prothrombin Time: 14.1 seconds (ref 11.4–15.2)

## 2017-12-23 LAB — TYPE AND SCREEN
ABO/RH(D): O NEG
ANTIBODY SCREEN: NEGATIVE

## 2017-12-23 LAB — APTT: aPTT: 40 seconds — ABNORMAL HIGH (ref 24–36)

## 2017-12-23 NOTE — Patient Instructions (Addendum)
Your procedure is scheduled on: Wednesday, December 30, 2017 Report to Day Surgery on the 2nd floor of the [facility]. To find out your arrival time, please call [phone] between 1PM - 3PM on: Tuesday, December 29, 2017  REMEMBER: Instructions that are not followed completely may result in serious medical risk, up to and including death; or upon the discretion of your surgeon and anesthesiologist your surgery may need to be rescheduled.  Do not eat food after midnight the night before your procedure.  No gum chewing, lozengers or hard candies.  You may however, drink CLEAR liquids up to 2 hours before you are scheduled to arrive for your surgery. Do not drink anything within 2 hours of the start of your surgery.  Clear liquids include: - water  - apple juice without pulp - clear gatorade - black coffee or tea (Do NOT add anything to the coffee or tea) Do NOT drink anything that is not on this list.  No Alcohol for 24 hours before or after surgery.  No Smoking including e-cigarettes for 24 hours prior to surgery.  No chewable tobacco products for at least 6 hours prior to surgery.  No nicotine patches on the day of surgery.  On the morning of surgery brush your teeth with toothpaste and water, you may rinse your mouth with mouthwash if you wish. Do not swallow any toothpaste or mouthwash.  Notify your doctor if there is any change in your medical condition (cold, fever, infection).  Do not wear jewelry, make-up, hairpins, clips or nail polish.  Do not wear lotions, powders, or perfumes. You may wear deodorant.  Do not shave 48 hours prior to surgery. Men may shave face and neck.  Contacts and dentures may not be worn into surgery.  Do not bring valuables to the hospital, including drivers license, insurance or credit cards.  [facility] is not responsible for any belongings or valuables.   TAKE THESE MEDICATIONS THE MORNING OF SURGERY:  1.  OMEPRAZOLE 2.  COSOPT EYE  DROPS  Use CHG Soap as directed on instruction sheet.  Follow recommendations from Cardiologist stopping Aspirin. Call Dr. Nehemiah Massed to see if it is ok to stop the Aspirin.  NOW!  Stop Anti-inflammatories (NSAIDS) such as Advil, Aleve, Ibuprofen, Motrin, Naproxen, Naprosyn and Aspirin based products such as Excedrin, Goodys Powder, BC Powder. (May take Tylenol or Acetaminophen if needed.)  NOW!  Stop ANY OVER THE COUNTER supplements until after surgery. (CO-Q10, MELATONIN, GLUCOSAMINE, AREDS, FISH OIL) (May continue Vitamin D AND Vitamin B.)  Wear comfortable clothing (specific to your surgery type) to the hospital.  Plan for stool softeners for home use.  If you are being admitted to the hospital overnight, leave your suitcase in the car. After surgery it may be brought to your room.  If you are being discharged the day of surgery, you will not be allowed to drive home. You will need a responsible adult to drive you home and stay with you that night.   If you are taking public transportation, you will need to have a responsible adult with you. Please confirm with your physician that it is acceptable to use public transportation.   Please call [phone] if you have any questions about these instructions.

## 2017-12-23 NOTE — Telephone Encounter (Signed)
Voicemail left for patient to call our office if there are any questions or concerns re; procedure on yesterday.

## 2017-12-23 NOTE — Pre-Procedure Instructions (Signed)
Cardiology clearance form faxed to Dr. Nehemiah Massed as requested by Dr. Izora Ribas.  Patient had cardiology appt. on 12/14/17 and had EKG; and has a follow up appointment with Dr. Nehemiah Massed on 12/28/17 for stress test. Patient advised to call cardiology to ask when it is safe to stop taking aspirin.

## 2017-12-28 DIAGNOSIS — I25708 Atherosclerosis of coronary artery bypass graft(s), unspecified, with other forms of angina pectoris: Secondary | ICD-10-CM | POA: Diagnosis not present

## 2017-12-28 DIAGNOSIS — I25810 Atherosclerosis of coronary artery bypass graft(s) without angina pectoris: Secondary | ICD-10-CM | POA: Diagnosis not present

## 2017-12-28 DIAGNOSIS — R0602 Shortness of breath: Secondary | ICD-10-CM | POA: Diagnosis not present

## 2017-12-28 DIAGNOSIS — I10 Essential (primary) hypertension: Secondary | ICD-10-CM | POA: Diagnosis not present

## 2017-12-28 DIAGNOSIS — I252 Old myocardial infarction: Secondary | ICD-10-CM | POA: Diagnosis not present

## 2017-12-28 DIAGNOSIS — I493 Ventricular premature depolarization: Secondary | ICD-10-CM | POA: Diagnosis not present

## 2017-12-29 MED ORDER — CLINDAMYCIN PHOSPHATE 600 MG/50ML IV SOLN
600.0000 mg | Freq: Once | INTRAVENOUS | Status: AC
  Administered 2017-12-30: 600 mg via INTRAVENOUS

## 2017-12-29 NOTE — Pre-Procedure Instructions (Signed)
CARDIAC CLEARANCE NOTE ON CHART

## 2017-12-30 ENCOUNTER — Inpatient Hospital Stay: Payer: PPO

## 2017-12-30 ENCOUNTER — Inpatient Hospital Stay: Payer: PPO | Admitting: Certified Registered"

## 2017-12-30 ENCOUNTER — Other Ambulatory Visit: Payer: Self-pay

## 2017-12-30 ENCOUNTER — Encounter
Admission: RE | Disposition: A | Discharge status: Home / Self Care | Payer: Self-pay | Source: Ambulatory Visit | Attending: Neurosurgery

## 2017-12-30 ENCOUNTER — Encounter: Payer: Self-pay | Admitting: *Deleted

## 2017-12-30 ENCOUNTER — Inpatient Hospital Stay
Admission: RE | Admit: 2017-12-30 | Discharge: 2017-12-31 | DRG: 472 | Disposition: A | Discharge status: Home / Self Care | Payer: PPO | Source: Ambulatory Visit | Attending: Neurosurgery | Admitting: Neurosurgery

## 2017-12-30 DIAGNOSIS — I252 Old myocardial infarction: Secondary | ICD-10-CM | POA: Diagnosis not present

## 2017-12-30 DIAGNOSIS — Z419 Encounter for procedure for purposes other than remedying health state, unspecified: Secondary | ICD-10-CM

## 2017-12-30 DIAGNOSIS — H409 Unspecified glaucoma: Secondary | ICD-10-CM | POA: Diagnosis present

## 2017-12-30 DIAGNOSIS — I739 Peripheral vascular disease, unspecified: Secondary | ICD-10-CM | POA: Diagnosis present

## 2017-12-30 DIAGNOSIS — G4733 Obstructive sleep apnea (adult) (pediatric): Secondary | ICD-10-CM | POA: Diagnosis not present

## 2017-12-30 DIAGNOSIS — H3530 Unspecified macular degeneration: Secondary | ICD-10-CM | POA: Diagnosis not present

## 2017-12-30 DIAGNOSIS — G4730 Sleep apnea, unspecified: Secondary | ICD-10-CM | POA: Diagnosis not present

## 2017-12-30 DIAGNOSIS — I359 Nonrheumatic aortic valve disorder, unspecified: Secondary | ICD-10-CM | POA: Diagnosis present

## 2017-12-30 DIAGNOSIS — E785 Hyperlipidemia, unspecified: Secondary | ICD-10-CM | POA: Diagnosis present

## 2017-12-30 DIAGNOSIS — I2510 Atherosclerotic heart disease of native coronary artery without angina pectoris: Secondary | ICD-10-CM | POA: Diagnosis present

## 2017-12-30 DIAGNOSIS — M48062 Spinal stenosis, lumbar region with neurogenic claudication: Secondary | ICD-10-CM | POA: Diagnosis present

## 2017-12-30 DIAGNOSIS — Z951 Presence of aortocoronary bypass graft: Secondary | ICD-10-CM

## 2017-12-30 DIAGNOSIS — M4712 Other spondylosis with myelopathy, cervical region: Secondary | ICD-10-CM | POA: Diagnosis not present

## 2017-12-30 DIAGNOSIS — K219 Gastro-esophageal reflux disease without esophagitis: Secondary | ICD-10-CM | POA: Diagnosis present

## 2017-12-30 DIAGNOSIS — G959 Disease of spinal cord, unspecified: Secondary | ICD-10-CM | POA: Diagnosis not present

## 2017-12-30 DIAGNOSIS — Z981 Arthrodesis status: Secondary | ICD-10-CM

## 2017-12-30 DIAGNOSIS — Z79899 Other long term (current) drug therapy: Secondary | ICD-10-CM | POA: Diagnosis not present

## 2017-12-30 DIAGNOSIS — I6529 Occlusion and stenosis of unspecified carotid artery: Secondary | ICD-10-CM | POA: Diagnosis present

## 2017-12-30 DIAGNOSIS — Z87891 Personal history of nicotine dependence: Secondary | ICD-10-CM

## 2017-12-30 DIAGNOSIS — M4322 Fusion of spine, cervical region: Secondary | ICD-10-CM | POA: Diagnosis not present

## 2017-12-30 DIAGNOSIS — I10 Essential (primary) hypertension: Secondary | ICD-10-CM | POA: Diagnosis present

## 2017-12-30 DIAGNOSIS — Z7982 Long term (current) use of aspirin: Secondary | ICD-10-CM | POA: Diagnosis not present

## 2017-12-30 DIAGNOSIS — Z880 Allergy status to penicillin: Secondary | ICD-10-CM | POA: Diagnosis not present

## 2017-12-30 DIAGNOSIS — M4802 Spinal stenosis, cervical region: Secondary | ICD-10-CM | POA: Diagnosis not present

## 2017-12-30 DIAGNOSIS — M5412 Radiculopathy, cervical region: Secondary | ICD-10-CM | POA: Diagnosis not present

## 2017-12-30 HISTORY — PX: ANTERIOR CERVICAL DECOMP/DISCECTOMY FUSION: SHX1161

## 2017-12-30 LAB — ABO/RH: ABO/RH(D): O NEG

## 2017-12-30 SURGERY — ANTERIOR CERVICAL DECOMPRESSION/DISCECTOMY FUSION 3 LEVELS
Anesthesia: General | Site: Neck | Wound class: Clean

## 2017-12-30 MED ORDER — SUCCINYLCHOLINE CHLORIDE 20 MG/ML IJ SOLN
INTRAMUSCULAR | Status: DC | PRN
  Administered 2017-12-30: 120 mg via INTRAVENOUS

## 2017-12-30 MED ORDER — MENTHOL 3 MG MT LOZG
1.0000 | LOZENGE | OROMUCOSAL | Status: DC | PRN
  Filled 2017-12-30: qty 9

## 2017-12-30 MED ORDER — DEXAMETHASONE SODIUM PHOSPHATE 10 MG/ML IJ SOLN
INTRAMUSCULAR | Status: AC
  Filled 2017-12-30: qty 1

## 2017-12-30 MED ORDER — ISOSORBIDE MONONITRATE ER 30 MG PO TB24: 60.0000 mg | ORAL_TABLET | Freq: Every day | ORAL | Status: DC

## 2017-12-30 MED ORDER — CLINDAMYCIN PHOSPHATE 600 MG/50ML IV SOLN
INTRAVENOUS | Status: AC
  Filled 2017-12-30: qty 50

## 2017-12-30 MED ORDER — SODIUM CHLORIDE 0.9 % IJ SOLN
INTRAMUSCULAR | Status: AC
Start: 2017-12-30 — End: ?
  Filled 2017-12-30: qty 10

## 2017-12-30 MED ORDER — METHOCARBAMOL 500 MG PO TABS: 500.0000 mg | ORAL_TABLET | Freq: Four times a day (QID) | ORAL | Status: DC | PRN

## 2017-12-30 MED ORDER — PROPOFOL 10 MG/ML IV BOLUS
INTRAVENOUS | Status: AC
  Filled 2017-12-30: qty 20

## 2017-12-30 MED ORDER — SUCCINYLCHOLINE CHLORIDE 20 MG/ML IJ SOLN
INTRAMUSCULAR | Status: AC
  Filled 2017-12-30: qty 1

## 2017-12-30 MED ORDER — SODIUM CHLORIDE 0.9 % IV SOLN: 250.0000 mL | INTRAVENOUS | Status: DC

## 2017-12-30 MED ORDER — LACTATED RINGERS IV SOLN
INTRAVENOUS | Status: DC | PRN
  Administered 2017-12-30: 13:00:00 via INTRAVENOUS

## 2017-12-30 MED ORDER — PANTOPRAZOLE SODIUM 40 MG PO TBEC
40.0000 mg | DELAYED_RELEASE_TABLET | Freq: Every day | ORAL | Status: DC
  Administered 2017-12-31: 40 mg via ORAL
  Filled 2017-12-30: qty 1

## 2017-12-30 MED ORDER — VALSARTAN-HYDROCHLOROTHIAZIDE 320-25 MG PO TABS: 1.0000 | ORAL_TABLET | Freq: Every day | ORAL | Status: DC

## 2017-12-30 MED ORDER — SODIUM CHLORIDE 0.9 % IV SOLN
INTRAVENOUS | Status: DC | PRN
  Administered 2017-12-30: 20 ug/min via INTRAVENOUS

## 2017-12-30 MED ORDER — KETAMINE HCL 50 MG/ML IJ SOLN
INTRAMUSCULAR | Status: DC | PRN
  Administered 2017-12-30 (×2): 25 mg via INTRAVENOUS

## 2017-12-30 MED ORDER — REMIFENTANIL HCL 1 MG IV SOLR
INTRAVENOUS | Status: AC
  Filled 2017-12-30: qty 1000

## 2017-12-30 MED ORDER — SENNOSIDES-DOCUSATE SODIUM 8.6-50 MG PO TABS: 1.0000 | ORAL_TABLET | Freq: Every evening | ORAL | Status: DC | PRN

## 2017-12-30 MED ORDER — SODIUM CHLORIDE 0.9 % IV SOLN
INTRAVENOUS | Status: DC
  Administered 2017-12-30: 18:00:00 via INTRAVENOUS

## 2017-12-30 MED ORDER — PROPOFOL 10 MG/ML IV BOLUS
INTRAVENOUS | Status: DC | PRN
  Administered 2017-12-30: 60 mg via INTRAVENOUS
  Administered 2017-12-30: 140 mg via INTRAVENOUS

## 2017-12-30 MED ORDER — AMLODIPINE BESYLATE 5 MG PO TABS
5.0000 mg | ORAL_TABLET | Freq: Every day | ORAL | Status: DC
  Administered 2017-12-30: 5 mg via ORAL
  Filled 2017-12-30: qty 1

## 2017-12-30 MED ORDER — POLYVINYL ALCOHOL 1.4 % OP SOLN
1.0000 [drp] | Freq: Every day | OPHTHALMIC | Status: DC | PRN
  Filled 2017-12-30: qty 15

## 2017-12-30 MED ORDER — HYDROCHLOROTHIAZIDE 25 MG PO TABS
25.0000 mg | ORAL_TABLET | Freq: Every day | ORAL | Status: DC
  Filled 2017-12-30: qty 1

## 2017-12-30 MED ORDER — SODIUM CHLORIDE 0.9% FLUSH: 3.0000 mL | INTRAVENOUS | Status: DC | PRN

## 2017-12-30 MED ORDER — ONDANSETRON HCL 4 MG PO TABS: 4.0000 mg | ORAL_TABLET | Freq: Four times a day (QID) | ORAL | Status: DC | PRN

## 2017-12-30 MED ORDER — SODIUM CHLORIDE 0.9% FLUSH: 3.0000 mL | Freq: Two times a day (BID) | INTRAVENOUS | Status: DC

## 2017-12-30 MED ORDER — TAMSULOSIN HCL 0.4 MG PO CAPS
0.4000 mg | ORAL_CAPSULE | Freq: Every day | ORAL | Status: DC
  Administered 2017-12-30 – 2017-12-31 (×2): 0.4 mg via ORAL
  Filled 2017-12-30 (×2): qty 1

## 2017-12-30 MED ORDER — IRBESARTAN 150 MG PO TABS
300.0000 mg | ORAL_TABLET | Freq: Every day | ORAL | Status: DC
  Administered 2017-12-30: 300 mg via ORAL
  Filled 2017-12-30: qty 2

## 2017-12-30 MED ORDER — METHOCARBAMOL 1000 MG/10ML IJ SOLN
500.0000 mg | Freq: Four times a day (QID) | INTRAMUSCULAR | Status: DC | PRN
  Filled 2017-12-30: qty 5

## 2017-12-30 MED ORDER — FENTANYL CITRATE (PF) 100 MCG/2ML IJ SOLN
25.0000 ug | INTRAMUSCULAR | Status: DC | PRN
  Administered 2017-12-30 (×4): 25 ug via INTRAVENOUS

## 2017-12-30 MED ORDER — FENTANYL CITRATE (PF) 100 MCG/2ML IJ SOLN
INTRAMUSCULAR | Status: DC | PRN
  Administered 2017-12-30 (×2): 50 ug via INTRAVENOUS

## 2017-12-30 MED ORDER — PROPOFOL 500 MG/50ML IV EMUL
INTRAVENOUS | Status: AC
  Filled 2017-12-30: qty 50

## 2017-12-30 MED ORDER — GLYCOPYRROLATE 0.2 MG/ML IJ SOLN
INTRAMUSCULAR | Status: DC | PRN
  Administered 2017-12-30 (×2): 0.1 mg via INTRAVENOUS

## 2017-12-30 MED ORDER — MIDAZOLAM HCL 2 MG/2ML IJ SOLN
INTRAMUSCULAR | Status: DC | PRN
  Administered 2017-12-30: 1 mg via INTRAVENOUS

## 2017-12-30 MED ORDER — FAMOTIDINE 20 MG PO TABS
10.0000 mg | ORAL_TABLET | Freq: Every day | ORAL | Status: DC
  Administered 2017-12-30 – 2017-12-31 (×2): 10 mg via ORAL
  Filled 2017-12-30 (×2): qty 1

## 2017-12-30 MED ORDER — REMIFENTANIL HCL 1 MG IV SOLR
INTRAVENOUS | Status: DC | PRN
  Administered 2017-12-30: .05 ug/kg/min via INTRAVENOUS
  Administered 2017-12-30: .1 ug/kg/min via INTRAVENOUS

## 2017-12-30 MED ORDER — ATORVASTATIN CALCIUM 20 MG PO TABS
20.0000 mg | ORAL_TABLET | Freq: Every day | ORAL | Status: DC
  Administered 2017-12-30: 20 mg via ORAL
  Filled 2017-12-30: qty 1

## 2017-12-30 MED ORDER — ACETAMINOPHEN 650 MG RE SUPP: 650.0000 mg | RECTAL | Status: DC | PRN

## 2017-12-30 MED ORDER — OXYCODONE HCL 5 MG PO TABS
5.0000 mg | ORAL_TABLET | ORAL | Status: DC | PRN
  Administered 2017-12-30 (×2): 5 mg via ORAL
  Filled 2017-12-30 (×2): qty 1

## 2017-12-30 MED ORDER — LIDOCAINE HCL (PF) 2 % IJ SOLN
INTRAMUSCULAR | Status: AC
  Filled 2017-12-30: qty 10

## 2017-12-30 MED ORDER — FENTANYL CITRATE (PF) 100 MCG/2ML IJ SOLN
INTRAMUSCULAR | Status: AC
  Administered 2017-12-30: 25 ug via INTRAVENOUS
  Filled 2017-12-30: qty 2

## 2017-12-30 MED ORDER — SODIUM CHLORIDE 0.9 % IV SOLN
INTRAVENOUS | Status: DC
  Administered 2017-12-30: 11:00:00 via INTRAVENOUS

## 2017-12-30 MED ORDER — DEXAMETHASONE SODIUM PHOSPHATE 10 MG/ML IJ SOLN
INTRAMUSCULAR | Status: DC | PRN
  Administered 2017-12-30: 10 mg via INTRAVENOUS

## 2017-12-30 MED ORDER — DEXAMETHASONE SODIUM PHOSPHATE 4 MG/ML IJ SOLN
4.0000 mg | Freq: Four times a day (QID) | INTRAMUSCULAR | Status: DC
  Administered 2017-12-30 – 2017-12-31 (×3): 4 mg via INTRAVENOUS
  Filled 2017-12-30 (×4): qty 1

## 2017-12-30 MED ORDER — EPHEDRINE SULFATE 50 MG/ML IJ SOLN
INTRAMUSCULAR | Status: DC | PRN
  Administered 2017-12-30: 5 mg via INTRAVENOUS
  Administered 2017-12-30: 15 mg via INTRAVENOUS
  Administered 2017-12-30: 5 mg via INTRAVENOUS
  Administered 2017-12-30: 15 mg via INTRAVENOUS

## 2017-12-30 MED ORDER — GLYCOPYRROLATE 0.2 MG/ML IJ SOLN
INTRAMUSCULAR | Status: AC
  Filled 2017-12-30: qty 1

## 2017-12-30 MED ORDER — SODIUM CHLORIDE 0.9 % IR SOLN
Status: DC | PRN
  Administered 2017-12-30: 1000 mL

## 2017-12-30 MED ORDER — MIDAZOLAM HCL 2 MG/2ML IJ SOLN
INTRAMUSCULAR | Status: AC
  Filled 2017-12-30: qty 2

## 2017-12-30 MED ORDER — THROMBIN 5000 UNITS EX SOLR
CUTANEOUS | Status: AC
  Filled 2017-12-30: qty 5000

## 2017-12-30 MED ORDER — ACETAMINOPHEN 500 MG PO TABS
1000.0000 mg | ORAL_TABLET | Freq: Four times a day (QID) | ORAL | Status: DC
  Administered 2017-12-30 – 2017-12-31 (×3): 1000 mg via ORAL
  Filled 2017-12-30 (×3): qty 2

## 2017-12-30 MED ORDER — GELATIN ABSORBABLE 12-7 MM EX MISC
CUTANEOUS | Status: AC
  Filled 2017-12-30: qty 1

## 2017-12-30 MED ORDER — PHENOL 1.4 % MT LIQD
1.0000 | OROMUCOSAL | Status: DC | PRN
  Filled 2017-12-30: qty 177

## 2017-12-30 MED ORDER — ONDANSETRON HCL 4 MG/2ML IJ SOLN
INTRAMUSCULAR | Status: AC
  Filled 2017-12-30: qty 2

## 2017-12-30 MED ORDER — LIDOCAINE HCL (CARDIAC) PF 100 MG/5ML IV SOSY
PREFILLED_SYRINGE | INTRAVENOUS | Status: DC | PRN
  Administered 2017-12-30: 60 mg via INTRAVENOUS

## 2017-12-30 MED ORDER — HYDROMORPHONE HCL 1 MG/ML IJ SOLN: 0.5000 mg | INTRAMUSCULAR | Status: DC | PRN

## 2017-12-30 MED ORDER — OXYCODONE HCL 5 MG PO TABS: 10.0000 mg | ORAL_TABLET | ORAL | Status: DC | PRN

## 2017-12-30 MED ORDER — DORZOLAMIDE HCL-TIMOLOL MAL 2-0.5 % OP SOLN
1.0000 [drp] | Freq: Two times a day (BID) | OPHTHALMIC | Status: DC
  Administered 2017-12-30 – 2017-12-31 (×2): 1 [drp] via OPHTHALMIC
  Filled 2017-12-30: qty 10

## 2017-12-30 MED ORDER — NITROGLYCERIN 0.4 MG SL SUBL: 0.4000 mg | SUBLINGUAL_TABLET | SUBLINGUAL | Status: DC | PRN

## 2017-12-30 MED ORDER — FENTANYL CITRATE (PF) 100 MCG/2ML IJ SOLN
INTRAMUSCULAR | Status: AC
  Filled 2017-12-30: qty 2

## 2017-12-30 MED ORDER — LATANOPROST 0.005 % OP SOLN
1.0000 [drp] | Freq: Every day | OPHTHALMIC | Status: DC
  Administered 2017-12-30: 1 [drp] via OPHTHALMIC
  Filled 2017-12-30: qty 2.5

## 2017-12-30 MED ORDER — ACETAMINOPHEN 325 MG PO TABS: 650.0000 mg | ORAL_TABLET | ORAL | Status: DC | PRN

## 2017-12-30 MED ORDER — ONDANSETRON HCL 4 MG/2ML IJ SOLN: 4.0000 mg | Freq: Once | INTRAMUSCULAR | Status: DC | PRN

## 2017-12-30 MED ORDER — THROMBIN 5000 UNITS EX SOLR
CUTANEOUS | Status: DC | PRN
  Administered 2017-12-30: 5000 [IU] via TOPICAL

## 2017-12-30 MED ORDER — PROPOFOL 500 MG/50ML IV EMUL
INTRAVENOUS | Status: DC | PRN
  Administered 2017-12-30: 100 ug/kg/min via INTRAVENOUS

## 2017-12-30 MED ORDER — ONDANSETRON HCL 4 MG/2ML IJ SOLN: 4.0000 mg | Freq: Four times a day (QID) | INTRAMUSCULAR | Status: DC | PRN

## 2017-12-30 SURGICAL SUPPLY — 64 items
ALLOGRAFT LORDOTIC 8X11X14 (Bone Implant) ×3 IMPLANT
ALLOGRAFT LORDOTIC CC 7X11X14 (Bone Implant) ×6 IMPLANT
BAND RUBBER 3X1/6 TAN STRL (MISCELLANEOUS) ×3 IMPLANT
BLADE BOVIE TIP EXT 4 (BLADE) ×3 IMPLANT
BLADE SURG 15 STRL LF DISP TIS (BLADE) ×1 IMPLANT
BLADE SURG 15 STRL SS (BLADE) ×2
BULB RESERV EVAC DRAIN JP 100C (MISCELLANEOUS) IMPLANT
BUR NEURO DRILL SOFT 3.0X3.8M (BURR) ×3 IMPLANT
CANISTER SUCT 1200ML W/VALVE (MISCELLANEOUS) ×6 IMPLANT
CHLORAPREP W/TINT 26ML (MISCELLANEOUS) ×3 IMPLANT
CLOSURE WOUND 1/2 X4 (GAUZE/BANDAGES/DRESSINGS)
COUNTER NEEDLE 20/40 LG (NEEDLE) ×3 IMPLANT
COVER LIGHT HANDLE STERIS (MISCELLANEOUS) ×6 IMPLANT
CRADLE LAMINECT ARM (MISCELLANEOUS) ×3 IMPLANT
CUP MEDICINE 2OZ PLAST GRAD ST (MISCELLANEOUS) ×6 IMPLANT
DERMABOND ADVANCED (GAUZE/BANDAGES/DRESSINGS) ×2
DERMABOND ADVANCED .7 DNX12 (GAUZE/BANDAGES/DRESSINGS) ×1 IMPLANT
DRAIN CHANNEL JP 10F RND 20C F (MISCELLANEOUS) IMPLANT
DRAPE C-ARM 42X72 X-RAY (DRAPES) ×6 IMPLANT
DRAPE INCISE IOBAN 66X45 STRL (DRAPES) ×3 IMPLANT
DRAPE LAPAROTOMY 77X122 PED (DRAPES) ×3 IMPLANT
DRAPE MICROSCOPE SPINE 48X150 (DRAPES) ×3 IMPLANT
DRAPE POUCH INSTRU U-SHP 10X18 (DRAPES) ×3 IMPLANT
DRAPE SHEET LG 3/4 BI-LAMINATE (DRAPES) ×3 IMPLANT
DRAPE SURG 17X11 SM STRL (DRAPES) ×12 IMPLANT
DRAPE TABLE BACK 80X90 (DRAPES) ×3 IMPLANT
ELECT CAUTERY BLADE TIP 2.5 (TIP) ×3
ELECTRODE CAUTERY BLDE TIP 2.5 (TIP) ×1 IMPLANT
FEE INTRAOP MONITOR IMPULS NCS (MISCELLANEOUS) ×1 IMPLANT
FRAME EYE SHIELD (PROTECTIVE WEAR) ×3 IMPLANT
GAUZE SPONGE 4X4 12PLY STRL (GAUZE/BANDAGES/DRESSINGS) ×3 IMPLANT
GLOVE SURG SYN 8.5  E (GLOVE) ×6
GLOVE SURG SYN 8.5 E (GLOVE) ×3 IMPLANT
GOWN SRG XL LVL 3 NONREINFORCE (GOWNS) ×1 IMPLANT
GOWN STRL NON-REIN TWL XL LVL3 (GOWNS) ×2
GOWN STRL REUS W/ TWL LRG LVL3 (GOWN DISPOSABLE) ×1 IMPLANT
GOWN STRL REUS W/TWL LRG LVL3 (GOWN DISPOSABLE) ×2
GRADUATE 1200CC STRL 31836 (MISCELLANEOUS) ×3 IMPLANT
INTRAOP MONITOR FEE IMPULS NCS (MISCELLANEOUS) ×1
INTRAOP MONITOR FEE IMPULSE (MISCELLANEOUS) ×2
IV CATH ANGIO 12GX3 LT BLUE (NEEDLE) ×3 IMPLANT
KIT TURNOVER KIT A (KITS) ×3 IMPLANT
MARKER SKIN DUAL TIP RULER LAB (MISCELLANEOUS) ×6 IMPLANT
NEEDLE HYPO 22GX1.5 SAFETY (NEEDLE) ×3 IMPLANT
NS IRRIG 1000ML POUR BTL (IV SOLUTION) ×3 IMPLANT
PACK LAMINECTOMY NEURO (CUSTOM PROCEDURE TRAY) ×3 IMPLANT
PIN CASPAR 14 (PIN) ×1 IMPLANT
PIN CASPAR 14MM (PIN) ×3
PLATE ARCHON 3-LEVEL 62MM (Plate) ×3 IMPLANT
SCREW ARCHON SD VAR 4.0X15MM (Screw) ×12 IMPLANT
SCREW ARCHON ST VAR 4.0X17MM (Screw) ×12 IMPLANT
SPOGE SURGIFLO 8M (HEMOSTASIS) ×2
SPONGE KITTNER 5P (MISCELLANEOUS) ×6 IMPLANT
SPONGE SURGIFLO 8M (HEMOSTASIS) ×1 IMPLANT
STAPLER SKIN PROX 35W (STAPLE) ×3 IMPLANT
STRIP CLOSURE SKIN 1/2X4 (GAUZE/BANDAGES/DRESSINGS) IMPLANT
SUT V-LOC 90 ABS DVC 3-0 CL (SUTURE) ×3 IMPLANT
SUT VIC AB 3-0 SH 8-18 (SUTURE) ×3 IMPLANT
SYR 30ML LL (SYRINGE) ×3 IMPLANT
TAPE CLOTH 3X10 WHT NS LF (GAUZE/BANDAGES/DRESSINGS) ×3 IMPLANT
TOWEL OR 17X26 4PK STRL BLUE (TOWEL DISPOSABLE) ×12 IMPLANT
TRAY FOLEY MTR SLVR 16FR STAT (SET/KITS/TRAYS/PACK) IMPLANT
TUBING CONNECTING 10 (TUBING) ×2 IMPLANT
TUBING CONNECTING 10' (TUBING) ×1

## 2017-12-30 NOTE — Op Note (Signed)
Indications: Austin [name] is a 84 yo male who presented with cervical myelopathy.  He failed conservative management and elected for surgical intervention.  Findings: Severe cervical stenosis  Preoperative Diagnosis: Cervical myelopathy Postoperative Diagnosis: same   EBL: 25 ml IVF: 1000 ml Drains: none Disposition: Extubated and Stable to PACU Complications: none  No foley catheter was placed.   Preoperative Note:   Risks of surgery discussed include: infection, bleeding, stroke, coma, death, paralysis, CSF leak, nerve/spinal cord injury, numbness, tingling, weakness, complex regional pain syndrome, recurrent stenosis and/or disc herniation, vascular injury, development of instability, neck/back pain, need for further surgery, persistent symptoms, development of deformity, and the risks of anesthesia. The patient understood these risks and agreed to proceed.   Operative Procedure: 1. Anterior Cervical Discectomy and Fusion C4-5 including bilateral foraminotomies and end plate preparation  2. Anterior Cervical Discectomy and Fusion C5-6 including bilateral foraminotomies and end plate preparation  3. Anterior Cervical Discectomy and Fusion C3-4 including bilateral foraminotomies and end plate preparation 4. Anterior Spinal Instrumentation C3 to 6  5. Anterior arthrodesis from C3 to C6 6. Use of the operative microscope 7. Use of intraoperative flouroscopy  PROCEDURE IN DETAIL: After obtaining informed consent, the patient taken to the operating room, placed in supine position, general anesthesia induced.  The patient had a small shoulder roll placed behind their shoulders.  The patient received preop antibiotics and IV Decadron.  The patient had a neck incision outlined, was prepped and draped in usual sterile fashion. The incision was injected with local anesthetic.   An incision was opened, dissection taken down medial to the carotid artery and jugular vein, lateral to the trachea  and esophagus.  The prevertebral fascia identified and a localizing x-ray demonstrated the correct level.  The longus colli were dissected laterally, and self-retaining retractors placed to open the operative field. The microscope was then brought into the field.  With this complete, distractor pins were placed in the vertebral bodies of C3 and C4. The distractor was placed from C3-4, and the annulus at C3-4 was opened using a bovie.  Curettes and pituitary rongeurs used to remove the majority of disk, then the drill was used to remove the posterior osteophyte and begin the foraminotomies. The nerve hook was used to elevate the posterior longitudinal ligament, which was then removed with Kerrison rongeurs. The microblunt nerve hook could be passed out the foramen bilaterally.   Meticulous hemostasis obtained.  Structural allograft coated with demineralized bone matrix was tapped behind the anterior lip of the vertebral body at C3-4 (7 mm).  The distractor was then removed, and the C3 caspar pin removed. Bone wax was used for hemostasis. An additional Caspar pin was placed at C6. The distractor was placed from C4 to C6. The annuli at C4-5 and C5-6 were opened with a bovie. Curettes and pituitary rongeurs used to remove the majority of disk at each level, then the drill was used to remove the posterior osteophyte and begin the foraminotomies. The nerve hook was used to elevate the posterior longitudinal ligament, which was then removed with Kerrison rongeurs. The microblunt nerve hook could be passed out the foramen bilaterally at each level.   Meticulous hemostasis obtained. After hemostasis, structural allograft was tapped behind the anterior lip of the vertebral body at C4-5 (7 mm) and C5-6 (8 mm).    The caspar distractor was removed, and bone wax used for hemostasis at each level. Extensive anterior osteophytes were removed.   A separate, four segment, three  level plate (62 mm Nuvasive Archon) was chosen.   Two screws placed in the vertebral bodies of all four segments, respectively making sure the screws were behind the locking mechanism.  Final AP and lateral radiographs were taken.   With everything in good position, the wound was irrigated copiously with bacitracin-containing solution and meticulous hemostasis obtained.  Wound was closed in 2 layers using interrupted inverted 3-0 Vicryl sutures in the platysma and 3-0 monocryl in the dermis.  The wound was dressed with dermabond, the head of bed at 30 degrees, taken to recovery room in stable condition.  No new postop neurological deficits were identified..  All counts were correct at the end of the case. Monitoring was used throughout without any changes.  Marin Olp PA assisted in the entire procedure.  Meade Maw MD

## 2017-12-30 NOTE — Anesthesia Procedure Notes (Signed)
Procedure Name: Intubation Performed by: Louw, Jared P, RN Pre-anesthesia Checklist: Patient identified, Patient being monitored, Timeout performed, Emergency Drugs available and Suction available Patient Re-evaluated:Patient Re-evaluated prior to induction Oxygen Delivery Method: Circle system utilized Preoxygenation: Pre-oxygenation with 100% oxygen Induction Type: IV induction Ventilation: Mask ventilation without difficulty Laryngoscope Size: Mac and 4 Grade View: Grade I Tube type: Oral Tube size: 7.5 mm Number of attempts: 1 Airway Equipment and Method: Stylet Placement Confirmation: ETT inserted through vocal cords under direct vision,  positive ETCO2 and breath sounds checked- equal and bilateral Secured at: 21 cm Tube secured with: Tape Dental Injury: Teeth and Oropharynx as per pre-operative assessment        

## 2017-12-30 NOTE — Anesthesia Post-op Follow-up Note (Signed)
Anesthesia QCDR form completed.        

## 2017-12-30 NOTE — Transfer of Care (Signed)
Immediate Anesthesia Transfer of Care Note  Patient: Austin [name]  Procedure(s) Performed: ANTERIOR CERVICAL DECOMPRESSION/DISCECTOMY FUSION 3 LEVELS- C3-6 (N/A Neck)  Patient Location: PACU  Anesthesia Type:General  Level of Consciousness: awake, alert  and oriented  Airway & Oxygen Therapy: Patient Spontanous Breathing and Patient connected to face mask oxygen  Post-op Assessment: Report given to RN and Post -op Vital signs reviewed and stable  Post vital signs: Reviewed and stable  Last Vitals:  Vitals Value Taken Time  BP 150/69 12/30/2017  4:20 PM  Temp 36.1 C 12/30/2017  4:20 PM  Pulse 91 12/30/2017  4:22 PM  Resp 18 12/30/2017  4:22 PM  SpO2 99 % 12/30/2017  4:22 PM  Vitals shown include unvalidated device data.  Last Pain:  Vitals:   12/30/17 1034  TempSrc: Oral  PainSc: 4          Complications: No apparent anesthesia complications

## 2017-12-30 NOTE — Progress Notes (Signed)
Pt arrived to room 160 from PACU. Pt is A&Ox4; neuro intact, denies numbness/tingling. Pt stood at bedside to urinate, but was only able to urinate small amounts. Pt educated on back precautions and plan of care. Secretary is picking up aspen collar.   [place], Jerry Caras

## 2017-12-30 NOTE — H&P (Signed)
History of Present Illness: 12/30/2017 I have confirmed the key details below. Austin [name] is here to proceed with surgery.  His arm tingling has worsened.  11/19/2017  Austin [name] returns today to review his MRI scan. He continues to have the same symptoms as below. He has tingling and numbness in his left arm. He has some symptoms of cervical myelopathy. He continues to have severe pain down his legs and neurogenic claudication.  10/15/2017 Austin [name] is here today with a chief complaint of low back pain, occasional numbness/tingling in right foot. He has pain that goes into his posterior buttocks bilaterally. Also complains of tingling down arm to thumb and index finger that comes and goes. He states this alternates from right to left upper extremity.  He previously saw Dr. Mauri Pole in 2011 and was offered surgery, but opted for injections. Those worked very well until recently. He can only walk approximately 1 block without stopping. His posture has gotten progressively stooped over.   Duration: years, worsening this year Quality: dull, stabbing, sharp Severity: 4  Precipitating: aggravated by bending, prolonged standing, rolling over in bed Modifying factors: made better by sitting down, being still, laying on the floor with feet raised Weakness: none Timing: first thing in the morning and worsens as the day goes on Bowel/Bladder Dysfunction: none  Conservative measures:  Physical therapy: has not tried  Multimodal medical therapy including regular antiinflammatories: aleve, tylenol, oxycodone Injections: has tried epidural steroid injections. He states they used to help, but the last one didn't help.  Past Surgery: denies  He also has issues with pain and tingling into his thumb and index finger bilaterally over the past few months.  Austin [name] has no symptoms of cervical myelopathy.  The symptoms are causing a significant impact on the patient's life.   Review of  Systems:  A 10 point review of systems is negative, except for the pertinent positives and negatives detailed in the HPI.  Past Medical History: Past Medical History:  Diagnosis Date  . ARMD (age related macular degeneration)  pt said he had ARMD taking ARED vitamin, does not know which eye  . CAD (coronary artery disease)  . Carotid atherosclerosis  . GERD (gastroesophageal reflux disease)  . Glaucoma suspect of both eyes  Using drops, but only a suspect per pt.  . Hyperlipidemia  . Hypertension  . Kidney stones  . PVD (peripheral vascular disease) (CMS-HCC)  . VHD (valvular heart disease)   Past Surgical History: Past Surgical History:  Procedure Laterality Date  . COLONOSCOPY 03/21/1999  Adenomatous Polyps, FH Colon Polyps (Father)  . COLONOSCOPY 08/20/2001  PH Adenomatous Polyps, FH Colon Polyps (Father)  . COLONOSCOPY 08/07/2005  Adenomatous Polyps, FH Colon Polyps (Father): CBF 08/2010  . COLONOSCOPY 10/02/2014  PH Adenomatous Polyps, FH Colon Polyps (Father): No repeat due to age per RTE (dw)  . CORONARY ARTERY BYPASS GRAFT  . EGD 03/21/1999, 08/20/2001, 05/02/2002  No repeat per RTE  . knee surgery Right 07/2016  . LENS EYE SURGERY Right 04-04-2014  . LENS EYE SURGERY Left 04-25-2014  . TONSILLECTOMY   Allergies: Allergies as of 11/19/2017 - Reviewed 11/19/2017  Allergen Reaction Noted  . Penicillins Itching, Rash, and Swelling 08/05/2013   Medications: Outpatient Encounter Medications as of 11/19/2017  Medication Sig Dispense Refill  . amLODIPine (NORVASC) 5 MG tablet TAKE 1 TABLET BY MOUTH DAILY 30 tablet 5  . aspirin 81 MG EC tablet Take 81 mg by mouth once daily.  Marland Kitchen atorvastatin (LIPITOR)  20 MG tablet Take 1 tablet (20 mg total) by mouth nightly 90 tablet 4  . biotin-silicon diox-L-cysteine 5,000 mcg -10 mg-50 mg TbER Take 1 tablet by mouth once daily.   . cholecalciferol (VITAMIN D3) 1,000 unit capsule Take 1,000 Units by mouth once daily.   . coQ10,  ubiquinol, 100 mg Cap Take 1 capsule by mouth once daily.  . cyanocobalamin (VITAMIN B12) 1000 MCG tablet Take 1,000 mcg by mouth once daily.  . dorzolamide-timolol (COSOPT) 22.3-6.8 mg/mL ophthalmic solution 1 drop 2 (two) times daily.  Marland Kitchen glucosamine &chondroit-mv-min3 (GLUCOTEN) 375-300-25-0.5 mg Tab Take 1 tablet by mouth 2 (two) times daily.  . hard/soft/gas permeable prods (SYSTANE CONTACTS) Drop Apply to eye.  . isosorbide mononitrate (IMDUR) 60 MG ER tablet TAKE ONE TABLET BY MOUTH EVERY DAY AS DIRECTED 30 tablet 5  . naproxen sodium (ALEVE, ANAPROX) 220 MG tablet Take 220 mg by mouth 2 (two) times daily as needed.  . nitroGLYcerin (NITROSTAT) 0.4 MG SL tablet DISSOLVE ONE TABLET UNDER THE TONGUE AS NEEDED FOR CHEST PAIN. MAY REPEAT AS INDICATED BY YOUR DOCTOR. 25 tablet 1  . omega-3 fatty acids/fish oil 340-1,000 mg capsule Take 1 capsule by mouth once daily.   Marland Kitchen omeprazole (PRILOSEC) 20 MG DR capsule TAKE 1 CAPSULE BY MOUTH TWICE DAILY 60 capsule 5  . tamsulosin (FLOMAX) 0.4 mg capsule Take 0.4 mg by mouth once daily. Take 30 minutes after same meal each day.  . TRAVATAN Z 0.004 % ophthalmic solution Place 1 drop into both eyes nightly.   . valsartan-hydrochlorothiazide (DIOVAN-HCT) 320-25 mg tablet TAKE ONE TABLET EVERY DAY 30 tablet 5  . VIT C/E/ZN/COPPR/LUTEIN/ZEAXAN (PRESERVISION AREDS 2 ORAL) Take 1 tablet by mouth 2 (two) times daily.    No facility-administered encounter medications on file as of 11/19/2017.   Social History: Social History   Tobacco Use  . Smoking status: Former Smoker  Types: Pipe, Landscape architect, Cigarettes  . Smokeless tobacco: Never Used  Substance Use Topics  . Alcohol use: Yes  Alcohol/week: 1.2 oz  Types: 2 Glasses of wine per week  Frequency: 4 or more times a week  Comment: 1-2 glasses of wine with dinner every night  . Drug use: No   Family Medical History: Family History  Problem Relation Age of Onset  . Coronary Artery Disease (Blocked  arteries around heart) Mother  . Myocardial Infarction (Heart attack) Mother  . High blood pressure (Hypertension) Mother  . Cataracts Mother  . Heart failure Father  . Myocardial Infarction (Heart attack) Father  . Osteoporosis (Thinning of bones) Father  . Prostate cancer Brother  . Myocardial Infarction (Heart attack) Maternal Uncle  2 uncles  . High blood pressure (Hypertension) Other  sibling  . Blindness Neg Hx  . Glaucoma Neg Hx  . Macular degeneration Neg Hx   Physical Examination: Vitals:   Vitals:   12/30/17 1034  BP: (!) 172/62  Pulse: (!) 57  Resp: 18  Temp: 98.1 F (36.7 C)  SpO2: 100%     General: Patient is well developed, well nourished, calm, collected, and in no apparent distress. Attention to examination is appropriate.  Psychiatric: Patient is non-anxious.  Head: Pupils equal, round, and reactive to light.  ENT: Oral mucosa appears well hydrated.  Neck: Supple. Full range of motion.  Respiratory: Patient is breathing without any difficulty.  Extremities: No edema.  Vascular: Palpable dorsal pedal pulses.  Skin: On exposed skin, there are no abnormal skin lesions.  Heart sounds normal no MRG. Chest  Clear to Auscultation Bilaterally.   NEUROLOGICAL:  General: In no acute distress.  Awake, alert, oriented to person, place, and time. Speech is clear and fluent. Fund of knowledge is appropriate.   Cranial Nerves: Pupils equal round and reactive to light. Facial tone is symmetric. Facial sensation is symmetric. Shoulder shrug is symmetric. Tongue protrusion is midline. There is no pronator drift.  ROM of spine: diminished extension. Palpation of spine: non tender.   Strength: Side Biceps Triceps Deltoid Interossei Grip Wrist Ext. Wrist Flex.  R 5 5 5 5 5 5 5   L 5 5 5 5 5 5 5    Side Iliopsoas Quads Hamstring PF DF EHL  R 5 5 5 5 5 5   L 5 5 5 5 5 5    Reflexes are 1+ and symmetric at the biceps, triceps, brachioradialis, patella and  achilles. Bilateral upper and lower extremity sensation is intact to light touch. Clonus is not present. Toes are down-going. Gait is abnormal and severely stooped. Moderate difficulty with tandem gait. Hoffman's is absent.  Rapid alternating movements are normal.   Medical Decision Making  Imaging: MRI L spine 09/18/2017  IMPRESSION: 1. Degenerative disc bulge with facet hypertrophy at L2-3 through L4-5 with resultant severe diffuse spinal stenosis, most severe at the L4-5 level. 2. Multifactorial degenerative changes with resultant multilevel foraminal narrowing as above. Notable findings include moderate right L2 foraminal narrowing, moderate left with moderate to severe right L3 foraminal stenosis, moderate to severe left L4 foraminal narrowing, and moderate bilateral L5 foraminal stenosis.  Electronically Signed By: BenjaminMcClintock M.D. On: 09/18/2017 22:17  MRI C spine 11/04/2017 IMPRESSION: 1. Disc osteophyte complexes and ligamentum flavum thickening cause spinal stenosis with cord flattening at C3-4 to C5-6. No cord signal abnormality. 2. Foraminal impingement throughout the cervical spine with the most severe stenoses seen bilaterally at C3-4, on the right at C4-5, and bilaterally at C5-6  Electronically Signed By: Avelina Laine M.D. On: 11/04/2017 13:44  I have personally reviewed the images and agree with the above interpretation. He has moderate to severe central stenosis from C3-6.  Assessment and Plan: Mr. Brobeck is a pleasant 84 y.o. male with neurogenic claudication due to severe lumbar stenosis, as well as cervical myelopathy.   He is here today for ACDF C3-6.  We have reviewed the risks and benefits.  He would like to proceed.  Meade Maw MD, [facility] Department of Neurosurgery

## 2017-12-30 NOTE — Anesthesia Preprocedure Evaluation (Signed)
Anesthesia Evaluation  Patient identified by MRN, date of birth, ID band Patient awake    Reviewed: Allergy & Precautions, NPO status , Patient's Chart, lab work & pertinent test results  History of Anesthesia Complications Negative for: history of anesthetic complications  Airway Mallampati: II       Dental   Pulmonary sleep apnea , neg COPD, former smoker,           Cardiovascular hypertension, Pt. on medications + CAD, + Past MI and + CABG       Neuro/Psych neg Seizures    GI/Hepatic GERD  Medicated,  Endo/Other  neg diabetes  Renal/GU Renal disease (stones)     Musculoskeletal   Abdominal   Peds  Hematology   Anesthesia Other Findings   Reproductive/Obstetrics                             Anesthesia Physical Anesthesia Plan  ASA: III  Anesthesia Plan: General   Post-op Pain Management:    Induction: Intravenous  PONV Risk Score and Plan: 2 and Dexamethasone and Ondansetron  Airway Management Planned: Oral ETT  Additional Equipment:   Intra-op Plan:   Post-operative Plan:   Informed Consent: I have reviewed the patients History and Physical, chart, labs and discussed the procedure including the risks, benefits and alternatives for the proposed anesthesia with the patient or authorized representative who has indicated his/her understanding and acceptance.     Plan Discussed with:   Anesthesia Plan Comments:         Anesthesia Quick Evaluation

## 2017-12-30 NOTE — Progress Notes (Signed)
Procedure: C3-C7 ACDF Procedure Date: 12/30/2017 Diagnosis: Cervical Myelopathy  History: Austin [name] is POD0 s/p C3-C7 ACDF for cervical myelopathy. Tolerated procedure well. Evaluated in recovery, still disoriented from anesthesia but able to obey command and converse. Pain currently 5-6/10 at posterior cervical area. Denies upper or lower extremity pain/numbness/tingling.   Physical Exam: Vitals:   12/30/17 1034 12/30/17 1620  BP: (!) 172/62 (!) 150/69  Pulse: (!) 57 92  Resp: 18 18  Temp: 98.1 F (36.7 C) (!) 96.9 F (36.1 C)  SpO2: 100% 99%    AA Ox3 CNI Strength:5/5 throughout  Sensation: intact and symmetric  Data:  No results for input(s): NA, K, CL, CO2, BUN, CREATININE, LABGLOM, GLUCOSE, CALCIUM in the last 168 hours. No results for input(s): AST, ALT, ALKPHOS in the last 168 hours.  Invalid input(s): TBILI   No results for input(s): WBC, HGB, HCT, PLT in the last 168 hours. No results for input(s): APTT, INR in the last 168 hours.       Other tests/results: No images available for review.   Assessment/Plan:  Austin [name] is POD0 s/p C3-C7 ACDF for cervical myelopathy. Denies any pain in postop recovery aside from expected posterior cervical pain. States symptoms would not normally be present when laying down. Will evaluated symptoms resolution when patient is able to ambulate.   mobilize pain control  DVT prophylaxis  PTOT  Marin Olp PA-C Department of Neurosurgery

## 2017-12-31 ENCOUNTER — Inpatient Hospital Stay: Payer: PPO

## 2017-12-31 ENCOUNTER — Encounter: Payer: Self-pay | Admitting: Neurosurgery

## 2017-12-31 MED ORDER — METHYLPREDNISOLONE 4 MG PO TBPK: ORAL_TABLET | ORAL | 0 refills | Status: DC

## 2017-12-31 MED ORDER — METHOCARBAMOL 500 MG PO TABS: 500.0000 mg | ORAL_TABLET | Freq: Four times a day (QID) | ORAL | 0 refills | Status: DC | PRN

## 2017-12-31 MED ORDER — OXYCODONE HCL 5 MG PO TABS: 5.0000 mg | ORAL_TABLET | ORAL | 0 refills | Status: DC | PRN

## 2017-12-31 NOTE — Discharge Instructions (Signed)
Your surgeon has performed an operation on your cervical spine (neck) to relieve pressure on the spinal cord and/or nerves. This involved making an incision in the front of your neck and removing one or more of the discs that support your spine. Next, a small piece of bone, a titanium plate, and screws were used to fuse two or more of the vertebrae (bones) together.  The following are instructions to help in your recovery once you have been discharged from the hospital. Even if you feel well, it is important that you follow these activity guidelines. If you do not let your neck heal properly from the surgery, you can increase the chance of return of your symptoms and other complications.  * Do not take anti-inflammatory medications for 3 months after surgery (naproxen [Aleve], ibuprofen [Advil, Motrin], celecoxib [Celebrex], etc.). These medications can prevent your bones from healing properly. * You may resume aspirin and omega-3 supplement after 1 week   Activity    No bending, lifting, or twisting (BLT). Avoid lifting objects heavier than 10 pounds (gallon milk jug).  Where possible, avoid household activities that involve lifting, bending, reaching, pushing, or pulling such as laundry, vacuuming, grocery shopping, and childcare. Try to arrange for help from friends and family for these activities while your back heals.  Increase physical activity slowly as tolerated.  Taking short walks is encouraged, but avoid strenuous exercise. Do not jog, run, bicycle, lift weights, or participate in any other exercises unless specifically allowed by your doctor.  Talk to your doctor before resuming sexual activity.  You should not drive until cleared by your doctor.  Until released by your doctor, you should not return to work or school.  You should rest at home and let your body heal.   You may shower three days after your surgery.  After showering, lightly dab your incision dry. Do not take a tub  bath or go swimming until approved by your doctor at your follow-up appointment.  If your doctor ordered a cervical collar (neck brace) for you, you should wear it whenever you are out of bed. You may remove it when lying down or sleeping, but you should wear it at all other times. Not all neck surgeries require a cervical collar.  If you smoke, we strongly recommend that you quit.  Smoking has been proven to interfere with normal bone healing and will dramatically reduce the success rate of your surgery. Please contact [facility] (800-QUIT-NOW) and use the resources at www.[facility].com for assistance in stopping smoking.  Surgical Incision   If you have a dressing on your incision, you may remove it two days after your surgery. Keep your incision area clean and dry.  If you have staples or stitches on your incision, you should have a follow up scheduled for removal. If you do not have staples or stitches, you will have steri-strips (small pieces of surgical tape) or Dermabond glue. The steri-strips/glue should begin to peel away within about a week (it is fine if the steri-strips fall off before then). If the strips are still in place one week after your surgery, you may gently remove them.  Diet           You may return to your usual diet. However, you may experience discomfort when swallowing in the first month after your surgery. This is normal. You may find that softer foods are more comfortable for you to swallow. Be sure to stay hydrated.  When to Contact us  You may experience pain in your neck and/or pain between your shoulder blades. This is normal and should improve in the next few weeks with the help of pain medication, muscle relaxers, and rest. Some patients report that a warm compress on the back of the neck or between the shoulder blades helps.  However, should you experience any of the following, contact us immediately:  New numbness or weakness  Pain that is progressively  getting worse, and is not relieved by your pain medication, muscle relaxers, rest, and warm compresses  Bleeding, redness, swelling, pain, or drainage from surgical incision  Chills or flu-like symptoms  Fever greater than 101.0 F (38.3 C)  Inability to eat, drink fluids, or take medications  Problems with bowel or bladder functions  Difficulty breathing or shortness of breath  Warmth, tenderness, or swelling in your calf Contact Information  During office hours (Monday-Friday 9 am to 5 pm), please call your physician at [phone] and ask for Berdine Addison  After hours and weekends, please call the [facility] Operator at [phone] and ask for the Neurosurgery Resident On Call   For a life-threatening emergency, call 911

## 2017-12-31 NOTE — Discharge Summary (Signed)
Procedure: C3-C7 ACDF Procedure Date: 12/30/2017 Diagnosis: Cervical Myelopathy  History: Austin [name] is POD1 s/p C3-C7 ACDF for cervical myelopathy. Patient is recovering very well. Symptoms prior to surgery remain resolved. Cervical collar received and placed. No issues with swallowing, voiding, ambulation. Pain currently rated 1/10.   Physical Exam: Vitals:   12/31/17 0351 12/31/17 0719  BP: (!) 151/72 139/63  Pulse: 81 86  Resp: 16 18  Temp: 98.1 F (36.7 C) 97.9 F (36.6 C)  SpO2: 96% 98%    AA Ox3 CNI Skin: Edema and ecchymosis superior aspect of incision site. Glue intact. No bleeding, tenderness.  Strength:5/5 throughout  Sensation: intact and symmetric  Data:  No results for input(s): NA, K, CL, CO2, BUN, CREATININE, LABGLOM, GLUCOSE, CALCIUM in the last 168 hours. No results for input(s): AST, ALT, ALKPHOS in the last 168 hours.  Invalid input(s): TBILI   No results for input(s): WBC, HGB, HCT, PLT in the last 168 hours. No results for input(s): APTT, INR in the last 168 hours.       Other tests/results:  EXAM: CERVICAL SPINE - 2-3 VIEW  COMPARISON:  Fluoroscopic images of December 30, 2017.  FINDINGS: Status post surgical anterior fusion of C3-4, C4-5 and C5-6 with interbody fusion. Moderate degenerative disc disease is also noted at C6-7. Good alignment of vertebral bodies is noted.  IMPRESSION: Status post surgical anterior fusion of C3-4, C4-5 and C5-6.   Electronically Signed   By: Marijo Conception, M.D.   On: 12/31/2017 09:22  Assessment/Plan:  Austin [name] is POD1 s/p C3-C7 ACDF for cervical myelopathy. Tolerated procedure well and recovering without issue. Able to ambulate, eat, and void without issue. Pain adequately controlled with tylenol, robaxin, and oxycodone. Will continue pain medications as needed. Also provide medrol dosepack for post op swelling.  2 week follow up in clinic already scheduled. Advised to contact office if  questions or concerns arise.   Marin Olp PA-C Department of Neurosurgery

## 2017-12-31 NOTE — Progress Notes (Signed)
Reviewed AVS with pt and spouse with verbal understanding. 1 RX given upon discharge. Reinforced the BLTs with pt. Aspen collar in place. Escorted to personal vehicle via wc

## 2018-01-01 NOTE — Anesthesia Postprocedure Evaluation (Signed)
Anesthesia Post Note  Patient: Austin [name]  Procedure(s) Performed: ANTERIOR CERVICAL DECOMPRESSION/DISCECTOMY FUSION 3 LEVELS- C3-6 (N/A Neck)  Anesthesia Type: General     Last Vitals:  Vitals:   12/31/17 0351 12/31/17 0719  BP: (!) 151/72 139/63  Pulse: 81 86  Resp: 16 18  Temp: 36.7 C 36.6 C  SpO2: 96% 98%    Last Pain:  Vitals:   12/31/17 0930  TempSrc:   PainSc: 0-No pain                 Molli Barrows

## 2018-01-11 ENCOUNTER — Ambulatory Visit: Payer: PPO | Admitting: Pain Medicine

## 2018-01-11 ENCOUNTER — Other Ambulatory Visit: Payer: Self-pay

## 2018-01-11 ENCOUNTER — Encounter: Payer: Self-pay | Admitting: Nurse Practitioner

## 2018-01-11 ENCOUNTER — Ambulatory Visit: Payer: PPO | Attending: Pain Medicine | Admitting: Nurse Practitioner

## 2018-01-11 VITALS — BP 113/53 | HR 58 | Temp 98.0°F | Ht 66.0 in | Wt 170.0 lb

## 2018-01-11 DIAGNOSIS — Z96651 Presence of right artificial knee joint: Secondary | ICD-10-CM | POA: Insufficient documentation

## 2018-01-11 DIAGNOSIS — E785 Hyperlipidemia, unspecified: Secondary | ICD-10-CM | POA: Diagnosis not present

## 2018-01-11 DIAGNOSIS — Z961 Presence of intraocular lens: Secondary | ICD-10-CM | POA: Diagnosis not present

## 2018-01-11 DIAGNOSIS — M5116 Intervertebral disc disorders with radiculopathy, lumbar region: Secondary | ICD-10-CM | POA: Diagnosis not present

## 2018-01-11 DIAGNOSIS — R001 Bradycardia, unspecified: Secondary | ICD-10-CM | POA: Diagnosis not present

## 2018-01-11 DIAGNOSIS — R7303 Prediabetes: Secondary | ICD-10-CM | POA: Insufficient documentation

## 2018-01-11 DIAGNOSIS — Z87442 Personal history of urinary calculi: Secondary | ICD-10-CM | POA: Diagnosis not present

## 2018-01-11 DIAGNOSIS — Z9841 Cataract extraction status, right eye: Secondary | ICD-10-CM | POA: Diagnosis not present

## 2018-01-11 DIAGNOSIS — Z9842 Cataract extraction status, left eye: Secondary | ICD-10-CM | POA: Diagnosis not present

## 2018-01-11 DIAGNOSIS — H409 Unspecified glaucoma: Secondary | ICD-10-CM | POA: Insufficient documentation

## 2018-01-11 DIAGNOSIS — I10 Essential (primary) hypertension: Secondary | ICD-10-CM | POA: Diagnosis not present

## 2018-01-11 DIAGNOSIS — Z86010 Personal history of colonic polyps: Secondary | ICD-10-CM | POA: Diagnosis not present

## 2018-01-11 DIAGNOSIS — Z880 Allergy status to penicillin: Secondary | ICD-10-CM | POA: Insufficient documentation

## 2018-01-11 DIAGNOSIS — K219 Gastro-esophageal reflux disease without esophagitis: Secondary | ICD-10-CM | POA: Insufficient documentation

## 2018-01-11 DIAGNOSIS — I252 Old myocardial infarction: Secondary | ICD-10-CM | POA: Insufficient documentation

## 2018-01-11 DIAGNOSIS — Z87891 Personal history of nicotine dependence: Secondary | ICD-10-CM | POA: Insufficient documentation

## 2018-01-11 DIAGNOSIS — Z981 Arthrodesis status: Secondary | ICD-10-CM | POA: Insufficient documentation

## 2018-01-11 DIAGNOSIS — Z951 Presence of aortocoronary bypass graft: Secondary | ICD-10-CM | POA: Insufficient documentation

## 2018-01-11 DIAGNOSIS — M4726 Other spondylosis with radiculopathy, lumbar region: Secondary | ICD-10-CM | POA: Diagnosis not present

## 2018-01-11 DIAGNOSIS — M545 Low back pain: Secondary | ICD-10-CM | POA: Diagnosis present

## 2018-01-11 DIAGNOSIS — M4316 Spondylolisthesis, lumbar region: Secondary | ICD-10-CM | POA: Insufficient documentation

## 2018-01-11 DIAGNOSIS — I739 Peripheral vascular disease, unspecified: Secondary | ICD-10-CM | POA: Insufficient documentation

## 2018-01-11 DIAGNOSIS — M5416 Radiculopathy, lumbar region: Secondary | ICD-10-CM | POA: Diagnosis not present

## 2018-01-11 DIAGNOSIS — G894 Chronic pain syndrome: Secondary | ICD-10-CM | POA: Diagnosis not present

## 2018-01-11 DIAGNOSIS — M48062 Spinal stenosis, lumbar region with neurogenic claudication: Secondary | ICD-10-CM | POA: Diagnosis not present

## 2018-01-11 DIAGNOSIS — Z8249 Family history of ischemic heart disease and other diseases of the circulatory system: Secondary | ICD-10-CM | POA: Diagnosis not present

## 2018-01-11 DIAGNOSIS — Z79899 Other long term (current) drug therapy: Secondary | ICD-10-CM | POA: Insufficient documentation

## 2018-01-11 DIAGNOSIS — M47816 Spondylosis without myelopathy or radiculopathy, lumbar region: Secondary | ICD-10-CM

## 2018-01-11 NOTE — Patient Instructions (Signed)
  ____________________________________________________________________________________________  Appointment Policy Summary  It is our goal and responsibility to provide the medical community with assistance in the evaluation and management of patients with chronic pain. Unfortunately our resources are limited. Because we do not have an unlimited amount of time, or available appointments, we are required to closely monitor and manage their use. The following rules exist to maximize their use:  Patient's responsibilities: 1. Punctuality:  At what time should I arrive? You should be physically present in our office 30 minutes before your scheduled appointment. Your scheduled appointment is with your assigned healthcare provider. However, it takes 5-10 minutes to be "checked-in", and another 15 minutes for the nurses to do the admission. If you arrive to our office at the time you were given for your appointment, you will end up being at least 20-25 minutes late to your appointment with the provider. 2. Tardiness:  What happens if I arrive only a few minutes after my scheduled appointment time? You will need to reschedule your appointment. The cutoff is your appointment time. This is why it is so important that you arrive at least 30 minutes before that appointment. If you have an appointment scheduled for 10:00 AM and you arrive at 10:01, you will be required to reschedule your appointment.  3. Plan ahead:  Always assume that you will encounter traffic on your way in. Plan for it. If you are dependent on a driver, make sure they understand these rules and the need to arrive early. 4. Other appointments and responsibilities:  Avoid scheduling any other appointments before or after your pain clinic appointments.  5. Be prepared:  Write down everything that you need to discuss with your healthcare provider and give this information to the admitting nurse. Write down the medications that you will need  refilled. Bring your pills and bottles (even the empty ones), to all of your appointments, except for those where a procedure is scheduled. 6. No children or pets:  Find someone to take care of them. It is not appropriate to bring them in. 7. Scheduling changes:  We request "advanced notification" of any changes or cancellations. 8. Advanced notification:  Defined as a time period of more than 24 hours prior to the originally scheduled appointment. This allows for the appointment to be offered to other patients. 9. Rescheduling:  When a visit is rescheduled, it will require the cancellation of the original appointment. For this reason they both fall within the category of "Cancellations".  10. Cancellations:  They require advanced notification. Any cancellation less than 24 hours before the  appointment will be recorded as a "No Show". 11. No Show:  Defined as an unkept appointment where the patient failed to notify or declare to the practice their intention or inability to keep the appointment.  Corrective process for repeat offenders:  1. Tardiness: Three (3) episodes of rescheduling due to late arrivals will be recorded as one (1) "No Show". 2. Cancellation or reschedule: Three (3) cancellations or rescheduling will be recorded as one (1) "No Show". 3. "No Shows": Three (3) "No Shows" within a 12 month period will result in discharge from the practice.  ____________________________________________________________________________________________   

## 2018-01-11 NOTE — Progress Notes (Signed)
Patient's Name: Austin [name]  MRN: [id number]  Referring Provider: Jerrol Banana.,*  DOB: [birth year]  PCP: Jerrol Banana., MD  DOS: 01/11/2018  Note by: Vevelyn Francois NP  Service setting: Ambulatory outpatient  Specialty: Interventional Pain Management  Location: [facility] (AMB) Pain Management Facility    Patient type: Established    Primary Reason(s) for Visit: Encounter for prescription drug management & post-procedure evaluation of chronic illness with mild to moderate exacerbation(Level of risk: moderate) CC: Back Pain (lower)  HPI  Austin [name] is a 84 y.o. year old, male patient, who comes today for a post-procedure evaluation and medication management. He has History of colonic polyps; Benign essential HTN; Benign fibroma of prostate; Atherosclerosis of coronary artery; Cataract; Esophageal stenosis; Acid reflux; Glaucoma; Hemorrhoid; HLD (hyperlipidemia); Obstructive apnea; Borderline diabetes; Esophagogastric ring; Heart valve disease; MI (mitral incompetence); Cardiomyopathy, ischemic; Coronary artery disease involving native coronary artery of native heart with unstable angina pectoris ([place]); Chronic low back pain (Primary Source of Pain) (Bilateral) (R>L); Lumbar spondylosis; Lumbar facet syndrome (Bilateral) (R>L); Peripheral vascular disease ([place]); Lumbar facet hypertrophy (multilevel); Grade 1 Anterolisthesis of L3 over L4; Lumbar foraminal stenosis (Right L2-3; Bilateral L3-4, L4-5); Carotid stenosis; Chronic knee pain (Right); Full thickness rotator cuff tear; Stiffness of shoulder joint; Tear of medial meniscus of knee; Weakness of limb; Osteoarthritis of knee (Right); Chronic pain syndrome; Lumbar central spinal stenosis (Severe at L4-5); Chronic Lumbar radiculitis (intermittent/recurrent) (Bilateral); Easy bruising; DDD (degenerative disc disease), lumbar; Status post right partial knee replacement; Chronic lower extremity pain (Right); Spondylosis without myelopathy or  radiculopathy, lumbosacral region; Spinal stenosis, lumbar region, with neurogenic claudication; SOBOE (shortness of breath on exertion); and Cervical myelopathy (HCC) on their problem list. His primarily concern today is the Back Pain (lower)  Pain Assessment: Location: Lower Back Radiating: Pain Radiaties down right buttocks, hip and thighs Onset: More than a month ago Duration: Chronic pain Quality: Aching Severity: 2 /10 (subjective, self-reported pain score)  Note: Reported level is compatible with observation.                          Effect on ADL: no prolonged walking or standing Timing: Intermittent Modifying factors: sit down, medications BP: (!) 113/53  HR: (!) 54  Austin [name] was last seen on 12/22/2017 for a procedure. During today's appointment we reviewed Austin [name] post-procedure results, as well as his outpatient medication regimen. He states that his has had some relief with the TFESI. He admits that he was supposed to have lumbar spine surgery but was informed by neurosurgery that they cervical spine had to be completed first. He is SP ABCF 12/30/17. He states that he has pain in his left shoulder and chest.  He admits that the use of extra Tylenol last evening was effective for this pain.  He admits that his pain in his lower back is constant.  He admits that he has to wait several more weeks before he can have his lumbar spine surgery.  He admits that he did the transforaminal epidural steroid to help with the back pain until then.  Further details on both, my assessment(s), as well as the proposed treatment plan, please see below.   Post-Procedure Assessment  12/22/17 Procedure:bilateral lumbar transforaminal epidural steroid injection Pre-procedure pain score:  6/10 Post-procedure pain score: 1/10         Influential Factors: BMI: 27.44 kg/m Intra-procedural challenges: None observed.  Assessment challenges: None detected.              Reported  side-effects: None.        Post-procedural adverse reactions or complications: None reported         Sedation: Please see nurses note. When no sedatives are used, the analgesic levels obtained are directly associated to the effectiveness of the local anesthetics. However, when sedation is provided, the level of analgesia obtained during the initial 1 hour following the intervention, is believed to be the result of a combination of factors. These factors may include, but are not limited to: 1. The effectiveness of the local anesthetics used. 2. The effects of the analgesic(s) and/or anxiolytic(s) used. 3. The degree of discomfort experienced by the patient at the time of the procedure. 4. The patients ability and reliability in recalling and recording the events. 5. The presence and influence of possible secondary gains and/or psychosocial factors. Reported result: Relief experienced during the 1st hour after the procedure: 90 % (Ultra-Short Term Relief)            Interpretative annotation: Clinically appropriate result. Analgesia during this period is likely to be Local Anesthetic and/or IV Sedative (Analgesic/Anxiolytic) related.          Effects of local anesthetic: The analgesic effects attained during this period are directly associated to the localized infiltration of local anesthetics and therefore cary significant diagnostic value as to the etiological location, or anatomical origin, of the pain. Expected duration of relief is directly dependent on the pharmacodynamics of the local anesthetic used. Long-acting (4-6 hours) anesthetics used.  Reported result: Relief during the next 4 to 6 hour after the procedure: 80 % (Short-Term Relief)            Interpretative annotation: Clinically appropriate result. Analgesia during this period is likely to be Local Anesthetic-related.          Long-term benefit: Defined as the period of time past the expected duration of local anesthetics (1 hour for  short-acting and 4-6 hours for long-acting). With the possible exception of prolonged sympathetic blockade from the local anesthetics, benefits during this period are typically attributed to, or associated with, other factors such as analgesic sensory neuropraxia, antiinflammatory effects, or beneficial biochemical changes provided by agents other than the local anesthetics.  Reported result: Extended relief following procedure: 80 % (Long-Term Relief)            Interpretative annotation: Clinically possible results. Good relief. No permanent benefit expected. Inflammation plays a part in the etiology to the pain.          Current benefits: Defined as reported results that persistent at this point in time.   Analgesia: 50-75 %            Function: Somewhat improved ROM: Somewhat improved Interpretative annotation: Ongoing benefit.                Interpretation: Results would suggest a successful diagnostic intervention.                  Plan:  Please see "Plan of Care" for details.                Laboratory Chemistry  Inflammation Markers (CRP: Acute Phase) (ESR: Chronic Phase) No results found for: CRP, ESRSEDRATE, LATICACIDVEN                       Rheumatology Markers No results found for: RF, ANA,  LABURIC, URICUR, LYMEIGGIGMAB, LYMEABIGMQN, HLAB27                      Renal Function Markers Lab Results  Component Value Date   BUN 22 12/02/2017   CREATININE 1.81 (H) 12/02/2017   BCR 12 12/02/2017   GFRAA 38 (L) 12/02/2017   GFRNONAA 33 (L) 12/02/2017                             Hepatic Function Markers Lab Results  Component Value Date   AST 13 12/02/2017   ALT 11 12/02/2017   ALBUMIN 4.4 12/02/2017   ALKPHOS 61 12/02/2017                        Electrolytes Lab Results  Component Value Date   NA 136 12/02/2017   K 4.2 12/02/2017   CL 102 12/02/2017   CALCIUM 9.3 12/02/2017                        Neuropathy Markers Lab Results  Component Value Date   HGBA1C  5.7 09/29/2016                        Bone Pathology Markers No results found for: VD25OH, [id number], BZ1696VE9, FY1017PZ0, 25OHVITD1, 25OHVITD2, 25OHVITD3, TESTOFREE, TESTOSTERONE                       Coagulation Parameters Lab Results  Component Value Date   INR 1.10 12/23/2017   LABPROT 14.1 12/23/2017   APTT 40 (H) 12/23/2017   PLT 168 12/02/2017                        Cardiovascular Markers Lab Results  Component Value Date   CKTOTAL 142 01/08/2015   HGB 12.2 (L) 12/02/2017   HCT 37.0 (L) 12/02/2017                         CA Markers No results found for: CEA, CA125, LABCA2                      Note: Lab results reviewed.  Recent Diagnostic Imaging Results  DG Cervical Spine 2 or 3 views CLINICAL DATA:  Status post cervical spine fusion.  EXAM: CERVICAL SPINE - 2-3 VIEW  COMPARISON:  Fluoroscopic images of December 30, 2017.  FINDINGS: Status post surgical anterior fusion of C3-4, C4-5 and C5-6 with interbody fusion. Moderate degenerative disc disease is also noted at C6-7. Good alignment of vertebral bodies is noted.  IMPRESSION: Status post surgical anterior fusion of C3-4, C4-5 and C5-6.  Electronically Signed   By: Marijo Conception, M.D.   On: 12/31/2017 09:22  Complexity Note: Imaging results reviewed. Results shared with Austin [name], using Layman's terms.                         Meds   Current Outpatient Medications:  .  acetaminophen (TYLENOL) 500 MG tablet, Take 1,000 mg by mouth daily as needed for moderate pain., Disp: , Rfl:  .  amLODipine (NORVASC) 5 MG tablet, Take 5 mg by mouth daily., Disp: , Rfl:  .  atorvastatin (LIPITOR) 20 MG tablet, Take 20 mg by mouth daily. , Disp: , Rfl:  .  Biotin 5000 MCG CAPS, Take 5,000 mcg by mouth daily. , Disp: , Rfl:  .  Cholecalciferol (VITAMIN D3) 2000 UNITS capsule, Take 2,000 Units by mouth daily. , Disp: , Rfl:  .  Coenzyme Q10 (COQ-10) 200 MG CAPS, Take 200 mg by mouth daily., Disp: , Rfl:  .   dorzolamide-timolol (COSOPT) 22.3-6.8 MG/ML ophthalmic solution, Place 1 drop into both eyes 2 (two) times daily. , Disp: , Rfl:  .  isosorbide mononitrate (IMDUR) 60 MG 24 hr tablet, Take 60 mg by mouth daily. , Disp: , Rfl:  .  Melatonin 5 MG CAPS, Take 5 mg by mouth at bedtime as needed (sleep)., Disp: , Rfl:  .  Menthol, Topical Analgesic, (BIOFREEZE EX), Apply 1 application topically daily as needed (back pain)., Disp: , Rfl:  .  Misc Natural Products (GLUCOSAMINE CHOND DOUBLE STR) TABS, Take 1 tablet by mouth 2 (two) times daily., Disp: , Rfl:  .  Multiple Vitamins-Minerals (PRESERVISION AREDS 2) CAPS, Take 1 capsule by mouth 2 (two) times daily., Disp: , Rfl:  .  nitroGLYCERIN (NITROSTAT) 0.4 MG SL tablet, Place 0.4 mg under the tongue every 5 (five) minutes as needed for chest pain. , Disp: , Rfl:  .  Omega-3 Fatty Acids (FISH OIL) 1200 MG CAPS, Take 1,200 mg by mouth 2 (two) times daily., Disp: , Rfl:  .  omeprazole (PRILOSEC) 20 MG capsule, Take 20 mg by mouth daily. , Disp: , Rfl:  .  oxyCODONE (OXY IR/ROXICODONE) 5 MG immediate release tablet, Take 1 tablet (5 mg total) by mouth every 3 (three) hours as needed for moderate pain ((score 4 to 6))., Disp: 30 tablet, Rfl: 0 .  Polyethyl Glycol-Propyl Glycol (SYSTANE OP), Place 1 drop into the right eye daily as needed (blurriness)., Disp: , Rfl:  .  ranitidine (ZANTAC) 150 MG capsule, Take 1 capsule (150 mg total) by mouth every evening., Disp: 30 capsule, Rfl: 5 .  tamsulosin (FLOMAX) 0.4 MG CAPS capsule, TAKE 1 CAPSULE BY MOUTH EVERY DAY, Disp: 30 capsule, Rfl: 11 .  Travoprost, BAK Free, (TRAVATAN Z) 0.004 % SOLN ophthalmic solution, Place 1 drop into both eyes at bedtime. , Disp: , Rfl:  .  valsartan-hydrochlorothiazide (DIOVAN HCT) 320-25 MG per tablet, Take 1 tablet by mouth daily. , Disp: , Rfl:  .  vitamin B-12 (CYANOCOBALAMIN) 1000 MCG tablet, Take 1,000 mcg by mouth daily., Disp: , Rfl:   ROS  Constitutional: Denies any fever or  chills Gastrointestinal: No reported hemesis, hematochezia, vomiting, or acute GI distress Musculoskeletal: Denies any acute onset joint swelling, redness, loss of ROM, or weakness Neurological: No reported episodes of acute onset apraxia, aphasia, dysarthria, agnosia, amnesia, paralysis, loss of coordination, or loss of consciousness  Allergies  Austin [name] is allergic to penicillins.  PFSH  Drug: Austin [name]  reports that he does not use drugs. Alcohol:  reports that he drinks alcohol. Tobacco:  reports that he has quit smoking. His smoking use included cigarettes. He quit after 15.00 years of use. He has never used smokeless tobacco. Medical:  has a past medical history of Absolute anemia (12/08/2014), Allergy, Arthritis, Bradycardia (05/03/2015), Calculus of kidney (12/08/2014), Coronary artery disease, GERD (gastroesophageal reflux disease), Glaucoma, History of kidney stones, HLD (hyperlipidemia), Hyperglycemia (08/27/2015), Hypertension, Myocardial infarction ([place]) (2011), Peripheral vascular disease ([place]), and VHD (valvular heart disease). Surgical: Austin [name]  has a past surgical history that includes Shoulder surgery (Right); Coronary artery bypass graft (2007); Colonoscopy (N/A, 10/02/2014); Tonsillectomy; Hemorroidectomy; Carpal tunnel release (Bilateral); Lumbar  epidural injection (2011); Mouth surgery; Cardiac catheterization (2007); Cardiac catheterization (N/A, 04/17/2015); Knee arthroscopy (Right, 08/05/2016); Knee arthroscopy with medial menisectomy (Right, 08/05/2016); Synovectomy (Right, 08/05/2016); Knee arthroscopy with lateral release (Right, 08/05/2016); Joint replacement (Right, 03/13/2018); Eye surgery; Cataract extraction w/ intraocular lens  implant, bilateral; Esophagogastroduodenoscopy; and Anterior cervical decomp/discectomy fusion (N/A, 12/30/2017). Family: family history includes Heart disease in his brother, brother, brother, brother, brother, father, and mother.  Constitutional Exam   General appearance: Well nourished, well developed, and well hydrated. In no apparent acute distress Vitals:   01/11/18 1308  BP: (!) 113/53  Pulse: (!) 58  Temp: 98 F (36.7 C)  SpO2: 100%  Weight: 170 lb (77.1 kg)  Height: _0  (1.676 m)   BMI Assessment: Estimated body mass index is 27.44 kg/m as calculated from the following:   Height as of this encounter: _1  (1.676 m).   Weight as of this encounter: 170 lb (77.1 kg). Psych/Mental status: Alert, oriented x 3 (person, place, & time)       Eyes: PERLA Respiratory: No evidence of acute respiratory distress  Cervical Spine Area Exam  Skin & Axial Inspection: Well healed scar from previous spine surgery detected increased swelling to the left anterior neck with neck brace patent  Alignment: Asymmetric Functional ROM: Guarding      Stability: No instability detected Muscle Tone/Strength: Functionally intact. No obvious neuro-muscular anomalies detected. Sensory (Neurological): Unimpaired Palpation: Tender              Upper Extremity (UE) Exam    Side: Right upper extremity  Side: Left upper extremity  Skin & Extremity Inspection: Skin color, temperature, and hair growth are WNL. No peripheral edema or cyanosis. No masses, redness, swelling, asymmetry, or associated skin lesions. No contractures.  Skin & Extremity Inspection: Skin color, temperature, and hair growth are WNL. No peripheral edema or cyanosis. No masses, redness, swelling, asymmetry, or associated skin lesions. No contractures.  Functional ROM: Unrestricted ROM          Functional ROM: Unrestricted ROM          Muscle Tone/Strength: Functionally intact. No obvious neuro-muscular anomalies detected.  Muscle Tone/Strength: Functionally intact. No obvious neuro-muscular anomalies detected.  Sensory (Neurological): Unimpaired          Sensory (Neurological): Unimpaired          Palpation: No palpable anomalies              Palpation: No palpable anomalies                Thoracic Spine Area Exam  Skin & Axial Inspection: No masses, redness, or swelling Alignment: Symmetrical Functional ROM: Unrestricted ROM Stability: No instability detected Muscle Tone/Strength: Functionally intact. No obvious neuro-muscular anomalies detected. Sensory (Neurological): Unimpaired Muscle strength & Tone: No palpable anomalies  Lumbar Spine Area Exam  Skin & Axial Inspection: No masses, redness, or swelling Alignment: Symmetrical Functional ROM: Unrestricted ROM       Stability: No instability detected Muscle Tone/Strength: Functionally intact. No obvious neuro-muscular anomalies detected. Sensory (Neurological): Unimpaired Palpation: Uncomfortable        Gait & Posture Assessment  Ambulation: Unassisted Gait: Relatively normal for age and body habitus Posture: WNL   Lower Extremity Exam    Side: Right lower extremity  Side: Left lower extremity  Stability: No instability observed          Stability: No instability observed          Skin & Extremity Inspection: Skin  color, temperature, and hair growth are WNL. No peripheral edema or cyanosis. No masses, redness, swelling, asymmetry, or associated skin lesions. No contractures.  Skin & Extremity Inspection: Skin color, temperature, and hair growth are WNL. No peripheral edema or cyanosis. No masses, redness, swelling, asymmetry, or associated skin lesions. No contractures.  Functional ROM: Unrestricted ROM                  Functional ROM: Unrestricted ROM                  Muscle Tone/Strength: Functionally intact. No obvious neuro-muscular anomalies detected.  Muscle Tone/Strength: Functionally intact. No obvious neuro-muscular anomalies detected.  Sensory (Neurological): Unimpaired  Sensory (Neurological): Unimpaired  Palpation: No palpable anomalies  Palpation: No palpable anomalies   Assessment  Primary Diagnosis & Pertinent Problem List: The primary encounter diagnosis was Lumbar spondylosis. Diagnoses of  Lumbar central spinal stenosis (Severe at L4-5), Chronic Lumbar radiculitis (intermittent/recurrent) (Bilateral), and Chronic pain syndrome were also pertinent to this visit.  Status Diagnosis  Persistent Persistent Persistent 1. Lumbar spondylosis   2. Lumbar central spinal stenosis (Severe at L4-5)   3. Chronic Lumbar radiculitis (intermittent/recurrent) (Bilateral)   4. Chronic pain syndrome     Problems updated and reviewed during this visit: No problems updated. Plan of Care  Pharmacotherapy (Medications Ordered): No orders of the defined types were placed in this encounter.  New Prescriptions   No medications on file   Medications administered today: Austin [name] "Mikki Santee" had no medications administered during this visit. Lab-work, procedure(s), and/or referral(s): No orders of the defined types were placed in this encounter.  Imaging and/or referral(s): None  Interventional management options: Planned, scheduled, and/or pending:   Not at this time.    Considering:   Palliative right-sided L3-4 LESI #4 Palliative right-sided L4-5LESI #2 Palliative bilateral lumbar facet block #3.  Diagnostic right-sided L2-3 transforaminalESI+ bilateral L3-4 and/or L4-5transforaminalESI   Palliative PRN treatment(s):   Palliative right-sided L3-4 LESI #4 Palliative right-sided L4-5LESI #2 Palliative bilateral lumbar facet block #3.    Provider-requested follow-up: Return for PRN Procedure.  Future Appointments  Date Time Provider [place]  02/22/2018  9:20 AM Jerrol Banana., MD [facility] None  06/01/2018  1:00 PM [facility] 1 [facility] None  06/01/2018  2:00 PM Dew, Erskine Squibb, MD [facility] None   Primary Care Physician: Jerrol Banana., MD Location: [facility] Note by: Vevelyn Francois NP Date: 01/11/2018; Time: 4:00 PM  Pain Score Disclaimer: We use the NRS-11 scale. This is a self-reported, subjective  measurement of pain severity with only modest accuracy. It is used primarily to identify changes within a particular patient. It must be understood that outpatient pain scales are significantly less accurate that those used for research, where they can be applied under ideal controlled circumstances with minimal exposure to variables. In reality, the score is likely to be a combination of pain intensity and pain affect, where pain affect describes the degree of emotional arousal or changes in action readiness caused by the sensory experience of pain. Factors such as social and work situation, setting, emotional state, anxiety levels, expectation, and prior pain experience may influence pain perception and show large inter-individual differences that may also be affected by time variables.  Patient instructions provided during this appointment: Patient Instructions  ____________________________________________________________________________________________  Appointment Policy Summary  It is our goal and responsibility to provide the medical community with assistance in the evaluation and management of  patients with chronic pain. Unfortunately our resources are limited. Because we do not have an unlimited amount of time, or available appointments, we are required to closely monitor and manage their use. The following rules exist to maximize their use:  Patient's responsibilities: 1. Punctuality:  At what time should I arrive? You should be physically present in our office 30 minutes before your scheduled appointment. Your scheduled appointment is with your assigned healthcare provider. However, it takes 5-10 minutes to be "checked-in", and another 15 minutes for the nurses to do the admission. If you arrive to our office at the time you were given for your appointment, you will end up being at least 20-25 minutes late to your appointment with the provider. 2. Tardiness:  What happens if I arrive only a  few minutes after my scheduled appointment time? You will need to reschedule your appointment. The cutoff is your appointment time. This is why it is so important that you arrive at least 30 minutes before that appointment. If you have an appointment scheduled for 10:00 AM and you arrive at 10:01, you will be required to reschedule your appointment.  3. Plan ahead:  Always assume that you will encounter traffic on your way in. Plan for it. If you are dependent on a driver, make sure they understand these rules and the need to arrive early. 4. Other appointments and responsibilities:  Avoid scheduling any other appointments before or after your pain clinic appointments.  5. Be prepared:  Write down everything that you need to discuss with your healthcare provider and give this information to the admitting nurse. Write down the medications that you will need refilled. Bring your pills and bottles (even the empty ones), to all of your appointments, except for those where a procedure is scheduled. 6. No children or pets:  Find someone to take care of them. It is not appropriate to bring them in. 7. Scheduling changes:  We request "advanced notification" of any changes or cancellations. 8. Advanced notification:  Defined as a time period of more than 24 hours prior to the originally scheduled appointment. This allows for the appointment to be offered to other patients. 9. Rescheduling:  When a visit is rescheduled, it will require the cancellation of the original appointment. For this reason they both fall within the category of "Cancellations".  10. Cancellations:  They require advanced notification. Any cancellation less than 24 hours before the  appointment will be recorded as a "No Show". 11. No Show:  Defined as an unkept appointment where the patient failed to notify or declare to the practice their intention or inability to keep the appointment.  Corrective process for repeat offenders:   1. Tardiness: Three (3) episodes of rescheduling due to late arrivals will be recorded as one (1) "No Show". 2. Cancellation or reschedule: Three (3) cancellations or rescheduling will be recorded as one (1) "No Show". 3. "No Shows": Three (3) "No Shows" within a 12 month period will result in discharge from the practice. ____________________________________________________________________________________________

## 2018-01-12 ENCOUNTER — Encounter: Payer: Self-pay | Admitting: Neurosurgery

## 2018-01-14 DIAGNOSIS — H353132 Nonexudative age-related macular degeneration, bilateral, intermediate dry stage: Secondary | ICD-10-CM | POA: Diagnosis not present

## 2018-01-19 ENCOUNTER — Ambulatory Visit (INDEPENDENT_AMBULATORY_CARE_PROVIDER_SITE_OTHER): Payer: PPO | Admitting: Family Medicine

## 2018-01-19 ENCOUNTER — Encounter: Payer: Self-pay | Admitting: Family Medicine

## 2018-01-19 VITALS — BP 122/60 | HR 56 | Temp 98.5°F | Resp 16 | Wt 163.0 lb

## 2018-01-19 DIAGNOSIS — G8929 Other chronic pain: Secondary | ICD-10-CM | POA: Diagnosis not present

## 2018-01-19 DIAGNOSIS — I25110 Atherosclerotic heart disease of native coronary artery with unstable angina pectoris: Secondary | ICD-10-CM

## 2018-01-19 DIAGNOSIS — M545 Low back pain: Secondary | ICD-10-CM

## 2018-01-19 DIAGNOSIS — M25561 Pain in right knee: Secondary | ICD-10-CM | POA: Diagnosis not present

## 2018-01-19 NOTE — Progress Notes (Signed)
Patient: Austin [name] Male    DOB: 08-Dec-1928   84 y.o.   MRN: [id number] Visit Date: 01/19/2018  Today's Provider: Wilhemena Durie, MD   Chief Complaint  Patient presents with  . Follow-up   Subjective:    HPI  Patient comes in today for a hospital follow up. He had a cervical spinal fusion on 12/30/17, and reports that he tolerated the surgery well. He was added on methocarbamol and celebrex for pain. He has been tolerating the medications well.  Overall he feels well.    Allergies  Allergen Reactions  . Penicillins Itching, Swelling and Rash    Has patient had a PCN reaction causing immediate rash, facial/tongue/throat swelling, SOB or lightheadedness with hypotension: Yes Has patient had a PCN reaction causing severe rash involving mucus membranes or skin necrosis: No Has patient had a PCN reaction that required hospitalization No Has patient had a PCN reaction occurring within the last 10 years: No If all of the above answers are "NO", then may proceed with Cephalosporin use.      Current Outpatient Medications:  .  acetaminophen (TYLENOL) 500 MG tablet, Take 1,000 mg by mouth daily as needed for moderate pain., Disp: , Rfl:  .  amLODipine (NORVASC) 5 MG tablet, Take 5 mg by mouth daily., Disp: , Rfl:  .  atorvastatin (LIPITOR) 20 MG tablet, Take 20 mg by mouth daily. , Disp: , Rfl:  .  Biotin 5000 MCG CAPS, Take 5,000 mcg by mouth daily. , Disp: , Rfl:  .  celecoxib (CELEBREX) 100 MG capsule, Take 1 capsule by mouth 2 (two) times daily., Disp: , Rfl:  .  Cholecalciferol (VITAMIN D3) 2000 UNITS capsule, Take 2,000 Units by mouth daily. , Disp: , Rfl:  .  Coenzyme Q10 (COQ-10) 200 MG CAPS, Take 200 mg by mouth daily., Disp: , Rfl:  .  dorzolamide-timolol (COSOPT) 22.3-6.8 MG/ML ophthalmic solution, Place 1 drop into both eyes 2 (two) times daily. , Disp: , Rfl:  .  isosorbide mononitrate (IMDUR) 60 MG 24 hr tablet, Take 60 mg by mouth daily. , Disp: , Rfl:  .   Melatonin 5 MG CAPS, Take 5 mg by mouth at bedtime as needed (sleep)., Disp: , Rfl:  .  Menthol, Topical Analgesic, (BIOFREEZE EX), Apply 1 application topically daily as needed (back pain)., Disp: , Rfl:  .  methocarbamol (ROBAXIN) 750 MG tablet, Take 1 tablet by mouth 4 (four) times daily as needed., Disp: , Rfl:  .  Misc Natural Products (GLUCOSAMINE CHOND DOUBLE STR) TABS, Take 1 tablet by mouth 2 (two) times daily., Disp: , Rfl:  .  Multiple Vitamins-Minerals (PRESERVISION AREDS 2) CAPS, Take 1 capsule by mouth 2 (two) times daily., Disp: , Rfl:  .  nitroGLYCERIN (NITROSTAT) 0.4 MG SL tablet, Place 0.4 mg under the tongue every 5 (five) minutes as needed for chest pain. , Disp: , Rfl:  .  Omega-3 Fatty Acids (FISH OIL) 1200 MG CAPS, Take 1,200 mg by mouth 2 (two) times daily., Disp: , Rfl:  .  omeprazole (PRILOSEC) 20 MG capsule, Take 20 mg by mouth daily. , Disp: , Rfl:  .  Polyethyl Glycol-Propyl Glycol (SYSTANE OP), Place 1 drop into the right eye daily as needed (blurriness)., Disp: , Rfl:  .  ranitidine (ZANTAC) 150 MG capsule, Take 1 capsule (150 mg total) by mouth every evening., Disp: 30 capsule, Rfl: 5 .  tamsulosin (FLOMAX) 0.4 MG CAPS capsule, TAKE 1 CAPSULE  BY MOUTH EVERY DAY, Disp: 30 capsule, Rfl: 11 .  Travoprost, BAK Free, (TRAVATAN Z) 0.004 % SOLN ophthalmic solution, Place 1 drop into both eyes at bedtime. , Disp: , Rfl:  .  valsartan-hydrochlorothiazide (DIOVAN HCT) 320-25 MG per tablet, Take 1 tablet by mouth daily. , Disp: , Rfl:  .  vitamin B-12 (CYANOCOBALAMIN) 1000 MCG tablet, Take 1,000 mcg by mouth daily., Disp: , Rfl:  .  oxyCODONE (OXY IR/ROXICODONE) 5 MG immediate release tablet, Take 1 tablet (5 mg total) by mouth every 3 (three) hours as needed for moderate pain ((score 4 to 6)). (Patient not taking: Reported on 01/19/2018), Disp: 30 tablet, Rfl: 0  Review of Systems  Constitutional: Negative for activity change, appetite change, chills, diaphoresis, fatigue, fever  and unexpected weight change.  Eyes: Negative.   Respiratory: Negative for shortness of breath.   Cardiovascular: Negative for chest pain, palpitations and leg swelling.  Gastrointestinal: Negative.   Endocrine: Negative.   Musculoskeletal: Positive for arthralgias and myalgias.  Allergic/Immunologic: Negative.   Psychiatric/Behavioral: Negative.     Social History   Tobacco Use  . Smoking status: Former Smoker    Years: 15.00    Types: Cigarettes  . Smokeless tobacco: Never Used  Substance Use Topics  . Alcohol use: Yes    Comment: 1-2 glasses of wine a night   Objective:   BP 122/60 (BP Location: Left Arm, Patient Position: Sitting, Cuff Size: Normal)   Pulse (!) 56   Temp 98.5 F (36.9 C)   Resp 16   Wt 163 lb (73.9 kg)   SpO2 100%   BMI 26.31 kg/m  Vitals:   01/19/18 1116  BP: 122/60  Pulse: (!) 56  Resp: 16  Temp: 98.5 F (36.9 C)  SpO2: 100%  Weight: 163 lb (73.9 kg)     Physical Exam  Constitutional: He is oriented to person, place, and time. He appears well-developed and well-nourished.  HENT:  Head: Normocephalic and atraumatic.  Right Ear: External ear normal.  Left Ear: External ear normal.  Nose: Nose normal.  Eyes: No scleral icterus.  Neck: No thyromegaly present.  Cardiovascular: Normal rate, regular rhythm and normal heart sounds.  Pulmonary/Chest: Effort normal and breath sounds normal.  Abdominal: Soft.  Musculoskeletal: He exhibits no edema.  Neurological: He is alert and oriented to person, place, and time.  Skin: Skin is warm and dry.  Psychiatric: He has a normal mood and affect. His behavior is normal. Judgment and thought content normal.        Assessment & Plan:       s/p cervical fusion CAD OA HTN HLD  I have done the exam and reviewed the above chart and it is accurate to the best of my knowledge. Development worker, community has been used in this note in any air is in the dictation or transcription are unintentional.  Wilhemena Durie, MD  [place]

## 2018-01-27 ENCOUNTER — Telehealth: Payer: Self-pay

## 2018-01-27 NOTE — Telephone Encounter (Signed)
Called pt to schedule AWV an pt declined. Pt states he has been in the office so much lately for other things and he wants to skip that this year. Pt states he does still want to schedule a CPE but will wait to do that at his next apt 02/22/18. FYI to PCP. -MM

## 2018-01-28 DIAGNOSIS — Z981 Arthrodesis status: Secondary | ICD-10-CM | POA: Diagnosis not present

## 2018-01-28 DIAGNOSIS — M47816 Spondylosis without myelopathy or radiculopathy, lumbar region: Secondary | ICD-10-CM | POA: Diagnosis not present

## 2018-01-28 DIAGNOSIS — G959 Disease of spinal cord, unspecified: Secondary | ICD-10-CM | POA: Diagnosis not present

## 2018-02-08 DIAGNOSIS — Z86018 Personal history of other benign neoplasm: Secondary | ICD-10-CM | POA: Diagnosis not present

## 2018-02-08 DIAGNOSIS — L918 Other hypertrophic disorders of the skin: Secondary | ICD-10-CM | POA: Diagnosis not present

## 2018-02-08 DIAGNOSIS — L578 Other skin changes due to chronic exposure to nonionizing radiation: Secondary | ICD-10-CM | POA: Diagnosis not present

## 2018-02-08 DIAGNOSIS — Z872 Personal history of diseases of the skin and subcutaneous tissue: Secondary | ICD-10-CM | POA: Diagnosis not present

## 2018-02-08 DIAGNOSIS — Z1283 Encounter for screening for malignant neoplasm of skin: Secondary | ICD-10-CM | POA: Diagnosis not present

## 2018-02-08 DIAGNOSIS — L570 Actinic keratosis: Secondary | ICD-10-CM | POA: Diagnosis not present

## 2018-02-22 ENCOUNTER — Encounter: Payer: Self-pay | Admitting: Family Medicine

## 2018-02-22 ENCOUNTER — Ambulatory Visit (INDEPENDENT_AMBULATORY_CARE_PROVIDER_SITE_OTHER): Payer: PPO | Admitting: Family Medicine

## 2018-02-22 VITALS — BP 112/62 | HR 58 | Temp 97.9°F | Wt 165.0 lb

## 2018-02-22 DIAGNOSIS — M5136 Other intervertebral disc degeneration, lumbar region: Secondary | ICD-10-CM | POA: Diagnosis not present

## 2018-02-22 DIAGNOSIS — D649 Anemia, unspecified: Secondary | ICD-10-CM | POA: Diagnosis not present

## 2018-02-22 DIAGNOSIS — M47816 Spondylosis without myelopathy or radiculopathy, lumbar region: Secondary | ICD-10-CM | POA: Diagnosis not present

## 2018-02-22 DIAGNOSIS — I25110 Atherosclerotic heart disease of native coronary artery with unstable angina pectoris: Secondary | ICD-10-CM | POA: Diagnosis not present

## 2018-02-22 DIAGNOSIS — I10 Essential (primary) hypertension: Secondary | ICD-10-CM

## 2018-02-22 DIAGNOSIS — M48062 Spinal stenosis, lumbar region with neurogenic claudication: Secondary | ICD-10-CM

## 2018-02-22 DIAGNOSIS — Z23 Encounter for immunization: Secondary | ICD-10-CM

## 2018-02-22 NOTE — Progress Notes (Signed)
Patient: Austin [name] Male    DOB: 15-May-1929   84 y.o.   MRN: [id number] Visit Date: 02/22/2018  Today's Provider: Wilhemena Durie, MD   Chief Complaint  Patient presents with  . Follow-up    Pt is due to have labs recheck.   Subjective:    Hypertension  This is a chronic problem. The problem is unchanged. The problem is controlled. Associated symptoms include malaise/fatigue. Pertinent negatives include no anxiety, blurred vision, chest pain, headaches, neck pain, orthopnea, palpitations, peripheral edema, PND, shortness of breath or sweats. There are no associated agents to hypertension. There are no compliance problems.      Pt comes in today to recheck labs.  At last check his CBC and Kidney functions were slightly abnormal.     Allergies  Allergen Reactions  . Penicillins Itching, Swelling and Rash    Has patient had a PCN reaction causing immediate rash, facial/tongue/throat swelling, SOB or lightheadedness with hypotension: Yes Has patient had a PCN reaction causing severe rash involving mucus membranes or skin necrosis: No Has patient had a PCN reaction that required hospitalization No Has patient had a PCN reaction occurring within the last 10 years: No If all of the above answers are "NO", then may proceed with Cephalosporin use.      Current Outpatient Medications:  .  acetaminophen (TYLENOL) 500 MG tablet, Take 1,000 mg by mouth daily as needed for moderate pain., Disp: , Rfl:  .  amLODipine (NORVASC) 5 MG tablet, Take 5 mg by mouth daily., Disp: , Rfl:  .  atorvastatin (LIPITOR) 20 MG tablet, Take 20 mg by mouth daily. , Disp: , Rfl:  .  Biotin 5000 MCG CAPS, Take 5,000 mcg by mouth daily. , Disp: , Rfl:  .  Cholecalciferol (VITAMIN D3) 2000 UNITS capsule, Take 2,000 Units by mouth daily. , Disp: , Rfl:  .  Coenzyme Q10 (COQ-10) 200 MG CAPS, Take 200 mg by mouth daily., Disp: , Rfl:  .  dorzolamide-timolol (COSOPT) 22.3-6.8 MG/ML ophthalmic solution,  Place 1 drop into both eyes 2 (two) times daily. , Disp: , Rfl:  .  isosorbide mononitrate (IMDUR) 60 MG 24 hr tablet, Take 60 mg by mouth daily. , Disp: , Rfl:  .  Melatonin 5 MG CAPS, Take 5 mg by mouth at bedtime as needed (sleep)., Disp: , Rfl:  .  Menthol, Topical Analgesic, (BIOFREEZE EX), Apply 1 application topically daily as needed (back pain)., Disp: , Rfl:  .  Multiple Vitamins-Minerals (PRESERVISION AREDS 2) CAPS, Take 1 capsule by mouth 2 (two) times daily., Disp: , Rfl:  .  nitroGLYCERIN (NITROSTAT) 0.4 MG SL tablet, Place 0.4 mg under the tongue every 5 (five) minutes as needed for chest pain. , Disp: , Rfl:  .  Omega-3 Fatty Acids (FISH OIL) 1200 MG CAPS, Take 1,200 mg by mouth 2 (two) times daily., Disp: , Rfl:  .  omeprazole (PRILOSEC) 20 MG capsule, Take 20 mg by mouth daily. , Disp: , Rfl:  .  Polyethyl Glycol-Propyl Glycol (SYSTANE OP), Place 1 drop into the right eye daily as needed (blurriness)., Disp: , Rfl:  .  tamsulosin (FLOMAX) 0.4 MG CAPS capsule, TAKE 1 CAPSULE BY MOUTH EVERY DAY, Disp: 30 capsule, Rfl: 11 .  valsartan-hydrochlorothiazide (DIOVAN HCT) 320-25 MG per tablet, Take 1 tablet by mouth daily. , Disp: , Rfl:  .  vitamin B-12 (CYANOCOBALAMIN) 1000 MCG tablet, Take 1,000 mcg by mouth daily., Disp: , Rfl:  .  celecoxib (CELEBREX) 100 MG capsule, Take 1 capsule by mouth 2 (two) times daily., Disp: , Rfl:  .  Misc Natural Products (GLUCOSAMINE CHOND DOUBLE STR) TABS, Take 1 tablet by mouth 2 (two) times daily., Disp: , Rfl:  .  oxyCODONE (OXY IR/ROXICODONE) 5 MG immediate release tablet, Take 1 tablet (5 mg total) by mouth every 3 (three) hours as needed for moderate pain ((score 4 to 6)). (Patient not taking: Reported on 02/22/2018), Disp: 30 tablet, Rfl: 0 .  ranitidine (ZANTAC) 150 MG capsule, Take 1 capsule (150 mg total) by mouth every evening. (Patient not taking: Reported on 02/22/2018), Disp: 30 capsule, Rfl: 5 .  Travoprost, BAK Free, (TRAVATAN Z) 0.004 % SOLN  ophthalmic solution, Place 1 drop into both eyes at bedtime. , Disp: , Rfl:   Review of Systems  Constitutional: Positive for fatigue and malaise/fatigue. Negative for activity change, appetite change, chills, diaphoresis, fever and unexpected weight change.  HENT: Negative.   Eyes: Negative.  Negative for blurred vision.  Respiratory: Negative.  Negative for shortness of breath.   Cardiovascular: Negative.  Negative for chest pain, palpitations, orthopnea and PND.  Gastrointestinal: Negative.   Endocrine: Negative.   Musculoskeletal: Positive for back pain. Negative for arthralgias, gait problem, joint swelling, myalgias, neck pain and neck stiffness.  Allergic/Immunologic: Negative.   Neurological: Negative for dizziness, light-headedness and headaches.  Hematological: Negative for adenopathy. Does not bruise/bleed easily.  Psychiatric/Behavioral: Negative.     Social History   Tobacco Use  . Smoking status: Former Smoker    Years: 15.00    Types: Cigarettes  . Smokeless tobacco: Never Used  Substance Use Topics  . Alcohol use: Yes    Comment: 1-2 glasses of wine a night   Objective:   BP 112/62 (BP Location: Left Arm, Patient Position: Sitting, Cuff Size: Normal)   Pulse (!) 58   Temp 97.9 F (36.6 C) (Oral)   Wt 165 lb (74.8 kg)   SpO2 97%   BMI 26.63 kg/m  Vitals:   02/22/18 0929  BP: 112/62  Pulse: (!) 58  Temp: 97.9 F (36.6 C)  TempSrc: Oral  SpO2: 97%  Weight: 165 lb (74.8 kg)     Physical Exam  Constitutional: He is oriented to person, place, and time. He appears well-developed and well-nourished.  HENT:  Head: Normocephalic and atraumatic.  Right Ear: External ear normal.  Left Ear: External ear normal.  Nose: Nose normal.  Eyes: Conjunctivae are normal. No scleral icterus.  Neck: No thyromegaly present.  Cardiovascular: Normal rate, regular rhythm and normal heart sounds.  Pulmonary/Chest: Effort normal and breath sounds normal.  Abdominal:  Soft.  Musculoskeletal: He exhibits no edema.  Lymphadenopathy:    He has no cervical adenopathy.  Neurological: He is alert and oriented to person, place, and time.  Skin: Skin is warm and dry.  Psychiatric: He has a normal mood and affect. His behavior is normal. Judgment and thought content normal.        Assessment & Plan:     1. Anemia, unspecified type  - CBC with Differential/Platelet - Renal Function Panel  2. Benign essential HTN RTC 6 months. - CBC with Differential/Platelet - Renal Function Panel  3. Need for influenza vaccination  - Flu vaccine HIGH DOSE PF (Fluzone High dose)  4. Coronary artery disease involving native coronary artery of native heart with unstable angina pectoris ([place])   5. Lumbar spondylosis   6. DDD (degenerative disc disease), lumbar   7. Lumbar central  spinal stenosis (Severe at L4-5) 8.s/p recent cervical surgery Scheduled for LS laminectomy     I have done the exam and reviewed the chart and it is accurate to the best of my knowledge. Development worker, community has been used and  any errors in dictation or transcription are unintentional. Miguel Aschoff M.D. [place], MD  [facility]

## 2018-02-23 LAB — RENAL FUNCTION PANEL
ALBUMIN: 4.4 g/dL (ref 3.5–4.7)
BUN / CREAT RATIO: 14 (ref 10–24)
BUN: 22 mg/dL (ref 8–27)
CALCIUM: 9.3 mg/dL (ref 8.6–10.2)
CHLORIDE: 100 mmol/L (ref 96–106)
CO2: 24 mmol/L (ref 20–29)
CREATININE: 1.61 mg/dL — AB (ref 0.76–1.27)
GFR calc Af Amer: 44 mL/min/{1.73_m2} — ABNORMAL LOW (ref >59)
GFR calc non Af Amer: 38 mL/min/{1.73_m2} — ABNORMAL LOW (ref >59)
Glucose: 82 mg/dL (ref 65–99)
PHOSPHORUS: 3.6 mg/dL (ref 2.5–4.5)
Potassium: 4.1 mmol/L (ref 3.5–5.2)
Sodium: 139 mmol/L (ref 134–144)

## 2018-02-23 LAB — CBC WITH DIFFERENTIAL/PLATELET
Basophils Absolute: 0 10*3/uL (ref 0.0–0.2)
Basos: 1 %
EOS (ABSOLUTE): 0.3 10*3/uL (ref 0.0–0.4)
EOS: 5 %
HEMATOCRIT: 34.5 % — AB (ref 37.5–51.0)
HEMOGLOBIN: 11.8 g/dL — AB (ref 13.0–17.7)
IMMATURE GRANS (ABS): 0 10*3/uL (ref 0.0–0.1)
IMMATURE GRANULOCYTES: 0 %
Lymphocytes Absolute: 1.3 10*3/uL (ref 0.7–3.1)
Lymphs: 23 %
MCH: 30.6 pg (ref 26.6–33.0)
MCHC: 34.2 g/dL (ref 31.5–35.7)
MCV: 89 fL (ref 79–97)
MONOCYTES: 9 %
Monocytes Absolute: 0.5 10*3/uL (ref 0.1–0.9)
NEUTROS PCT: 62 %
Neutrophils Absolute: 3.5 10*3/uL (ref 1.4–7.0)
Platelets: 182 10*3/uL (ref 150–450)
RBC: 3.86 x10E6/uL — AB (ref 4.14–5.80)
RDW: 14 % (ref 12.3–15.4)
WBC: 5.6 10*3/uL (ref 3.4–10.8)

## 2018-03-02 ENCOUNTER — Encounter
Admission: RE | Admit: 2018-03-02 | Discharge: 2018-03-02 | Disposition: A | Discharge status: Home / Self Care | Payer: PPO | Source: Ambulatory Visit | Attending: Neurosurgery | Admitting: Neurosurgery

## 2018-03-02 ENCOUNTER — Other Ambulatory Visit: Payer: Self-pay

## 2018-03-02 DIAGNOSIS — Z79899 Other long term (current) drug therapy: Secondary | ICD-10-CM | POA: Insufficient documentation

## 2018-03-02 DIAGNOSIS — I10 Essential (primary) hypertension: Secondary | ICD-10-CM | POA: Diagnosis not present

## 2018-03-02 DIAGNOSIS — M48061 Spinal stenosis, lumbar region without neurogenic claudication: Secondary | ICD-10-CM | POA: Diagnosis not present

## 2018-03-02 DIAGNOSIS — Z79891 Long term (current) use of opiate analgesic: Secondary | ICD-10-CM | POA: Diagnosis not present

## 2018-03-02 DIAGNOSIS — Z87891 Personal history of nicotine dependence: Secondary | ICD-10-CM | POA: Insufficient documentation

## 2018-03-02 DIAGNOSIS — Z01812 Encounter for preprocedural laboratory examination: Secondary | ICD-10-CM | POA: Insufficient documentation

## 2018-03-02 DIAGNOSIS — M47816 Spondylosis without myelopathy or radiculopathy, lumbar region: Secondary | ICD-10-CM | POA: Diagnosis not present

## 2018-03-02 DIAGNOSIS — M5136 Other intervertebral disc degeneration, lumbar region: Secondary | ICD-10-CM | POA: Insufficient documentation

## 2018-03-02 DIAGNOSIS — D649 Anemia, unspecified: Secondary | ICD-10-CM | POA: Insufficient documentation

## 2018-03-02 DIAGNOSIS — I25110 Atherosclerotic heart disease of native coronary artery with unstable angina pectoris: Secondary | ICD-10-CM | POA: Insufficient documentation

## 2018-03-02 HISTORY — DX: Angina pectoris, unspecified: I20.9

## 2018-03-02 LAB — URINALYSIS, ROUTINE W REFLEX MICROSCOPIC
BILIRUBIN URINE: NEGATIVE
Glucose, UA: NEGATIVE mg/dL
HGB URINE DIPSTICK: NEGATIVE
Ketones, ur: NEGATIVE mg/dL
Leukocytes, UA: NEGATIVE
NITRITE: NEGATIVE
PH: 6 (ref 5.0–8.0)
Protein, ur: NEGATIVE mg/dL
SPECIFIC GRAVITY, URINE: 1.013 (ref 1.005–1.030)

## 2018-03-02 LAB — CBC
HCT: 34.4 % — ABNORMAL LOW (ref 40.0–52.0)
Hemoglobin: 12.3 g/dL — ABNORMAL LOW (ref 13.0–18.0)
MCH: 32.2 pg (ref 26.0–34.0)
MCHC: 35.7 g/dL (ref 32.0–36.0)
MCV: 90.3 fL (ref 80.0–100.0)
PLATELETS: 180 10*3/uL (ref 150–440)
RBC: 3.81 MIL/uL — AB (ref 4.40–5.90)
RDW: 14.1 % (ref 11.5–14.5)
WBC: 7.4 10*3/uL (ref 3.8–10.6)

## 2018-03-02 LAB — BASIC METABOLIC PANEL
Anion gap: 8 (ref 5–15)
BUN: 28 mg/dL — AB (ref 8–23)
CALCIUM: 9.2 mg/dL (ref 8.9–10.3)
CO2: 25 mmol/L (ref 22–32)
Chloride: 103 mmol/L (ref 98–111)
Creatinine, Ser: 1.44 mg/dL — ABNORMAL HIGH (ref 0.61–1.24)
GFR calc Af Amer: 49 mL/min — ABNORMAL LOW (ref >60)
GFR, EST NON AFRICAN AMERICAN: 42 mL/min — AB (ref >60)
GLUCOSE: 108 mg/dL — AB (ref 70–99)
Potassium: 3.9 mmol/L (ref 3.5–5.1)
SODIUM: 136 mmol/L (ref 135–145)

## 2018-03-02 LAB — APTT: aPTT: 38 seconds — ABNORMAL HIGH (ref 24–36)

## 2018-03-02 LAB — SURGICAL PCR SCREEN
MRSA, PCR: NEGATIVE
Staphylococcus aureus: NEGATIVE

## 2018-03-02 LAB — PROTIME-INR
INR: 1.04
PROTHROMBIN TIME: 13.5 s (ref 11.4–15.2)

## 2018-03-02 NOTE — Patient Instructions (Signed)
Your procedure is scheduled on: Wednesday, October 9, 20A9  Report to [facility]    DO NOT STOP ON THE FIRST FLOOR TO REGISTER.  To find out your arrival time please call [phone] between 1PM - 3PM on Tuesday, March 02, 2018  Remember: Instructions that are not followed completely may result in serious medical risk,  up to and including death, or upon the discretion of your surgeon and anesthesiologist your  surgery may need to be rescheduled.     _X__ 1. Do not eat food after midnight the night before your procedure.                 No gum chewing or hard candies.                     NOTHING SOLID IN YOUR MOUTH AFTER MIDNIGHT.                   You may drink clear liquids up to 2 hours before you are scheduled to arrive for your surgery-                   DO not drink clear liquids within 2 hours of the start of your surgery.                  Clear Liquids include:  water, apple juice without pulp, clear carbohydrate                 drink such as Clearfast of Gatorade, Black Coffee or Tea (Do not add                 anything to coffee or tea).  __X__2.  On the morning of surgery brush your teeth with toothpaste and water,                   You may rinse your mouth with mouthwash if you wish.                     Do not swallow any toothpaste of mouthwash.     _X__ 3.  No Alcohol for 24 hours before or after surgery.   _X__ 4.  Do Not Smoke or use e-cigarettes For 24 Hours Prior to Your Surgery.                 Do not use any chewable tobacco products for at least 6 hours prior to                 surgery.  ____  5.  Bring all medications with you on the day of surgery if instructed.   ____  6.  Notify your doctor if there is any change in your medical condition      (cold, fever, infections).     Do not wear jewelry, make-up, hairpins, clips or nail polish. Do not wear lotions, powders, or perfumes. You may wear  deodorant. Do not shave 48 hours prior to surgery. Men may shave face and neck. Do not bring valuables to the hospital.    [facility] is not responsible for any belongings or valuables.  Contacts, dentures or bridgework may not be worn into surgery. Leave your suitcase in the car. After surgery it may be brought to your room. For patients admitted to the hospital, discharge time is determined by your treatment team.   Patients discharged the day  of surgery will not be allowed to drive home.   Please read over the following fact sheets that you were given:   PREPARING FOR SURGERY    _X___ Take these medicines the morning of surgery with A SIP OF WATER:    1. COSOPT EYE DROPS  2. TIMOLOL EYE DROPS  3.  OMEPRAZOLE  4.  5.  6.  ____ Fleet Enema (as directed)   __X__ Use CHG Soap as directed  ____ Use inhalers on the day of surgery  _X___ Stop ASPIRIN ACCORDING TO DR. Nehemiah Massed  _X___ Stop Anti-inflammatories AS OF TODAY   __X__ Stop supplements until after surgery.                THIS INCLUDES BIOTIN / COQ10 / GLUCOSAMINE / SALON PAS / FISH OIL / TURMERIC / AREDS PRESERVISION                   YOU MAY CONTINUE TAKING VITAMIN B12 AND VITAMIN D3 JUST DO NOT TAKE ON THE DAY OF SURGERY  ____ Bring C-Pap to the hospital.   CONTINUE ALL OTHER MEDICINES AS USUAL.  DO NOT TAKE DIOVAN ON THE MORNING OF SURGERY  WEAR COMFORTABLE CLOTHES

## 2018-03-03 NOTE — Pre-Procedure Instructions (Signed)
CLEARED BY DR Nehemiah Massed 03/02/18 ON CHART

## 2018-03-10 ENCOUNTER — Observation Stay
Admission: RE | Admit: 2018-03-10 | Discharge: 2018-03-11 | Disposition: A | Discharge status: Home / Self Care | Payer: PPO | Source: Ambulatory Visit | Attending: Neurosurgery | Admitting: Neurosurgery

## 2018-03-10 ENCOUNTER — Other Ambulatory Visit: Payer: Self-pay

## 2018-03-10 ENCOUNTER — Inpatient Hospital Stay: Payer: PPO | Admitting: Registered Nurse

## 2018-03-10 ENCOUNTER — Encounter: Payer: Self-pay | Admitting: *Deleted

## 2018-03-10 ENCOUNTER — Inpatient Hospital Stay: Payer: PPO

## 2018-03-10 ENCOUNTER — Encounter
Admission: RE | Disposition: A | Discharge status: Home / Self Care | Payer: Self-pay | Source: Ambulatory Visit | Attending: Neurosurgery

## 2018-03-10 DIAGNOSIS — Z87891 Personal history of nicotine dependence: Secondary | ICD-10-CM | POA: Insufficient documentation

## 2018-03-10 DIAGNOSIS — Z79899 Other long term (current) drug therapy: Secondary | ICD-10-CM | POA: Insufficient documentation

## 2018-03-10 DIAGNOSIS — M48061 Spinal stenosis, lumbar region without neurogenic claudication: Secondary | ICD-10-CM | POA: Diagnosis present

## 2018-03-10 DIAGNOSIS — G4733 Obstructive sleep apnea (adult) (pediatric): Secondary | ICD-10-CM | POA: Diagnosis not present

## 2018-03-10 DIAGNOSIS — M48062 Spinal stenosis, lumbar region with neurogenic claudication: Secondary | ICD-10-CM | POA: Diagnosis not present

## 2018-03-10 DIAGNOSIS — Z981 Arthrodesis status: Secondary | ICD-10-CM | POA: Diagnosis not present

## 2018-03-10 DIAGNOSIS — G9519 Other vascular myelopathies: Secondary | ICD-10-CM | POA: Diagnosis not present

## 2018-03-10 DIAGNOSIS — Z880 Allergy status to penicillin: Secondary | ICD-10-CM | POA: Diagnosis not present

## 2018-03-10 DIAGNOSIS — Z419 Encounter for procedure for purposes other than remedying health state, unspecified: Secondary | ICD-10-CM

## 2018-03-10 DIAGNOSIS — I739 Peripheral vascular disease, unspecified: Secondary | ICD-10-CM | POA: Diagnosis not present

## 2018-03-10 DIAGNOSIS — Z951 Presence of aortocoronary bypass graft: Secondary | ICD-10-CM | POA: Diagnosis not present

## 2018-03-10 DIAGNOSIS — I2510 Atherosclerotic heart disease of native coronary artery without angina pectoris: Secondary | ICD-10-CM | POA: Insufficient documentation

## 2018-03-10 DIAGNOSIS — G4730 Sleep apnea, unspecified: Secondary | ICD-10-CM | POA: Insufficient documentation

## 2018-03-10 DIAGNOSIS — E785 Hyperlipidemia, unspecified: Secondary | ICD-10-CM | POA: Insufficient documentation

## 2018-03-10 DIAGNOSIS — I252 Old myocardial infarction: Secondary | ICD-10-CM | POA: Diagnosis not present

## 2018-03-10 DIAGNOSIS — R29818 Other symptoms and signs involving the nervous system: Secondary | ICD-10-CM | POA: Diagnosis present

## 2018-03-10 DIAGNOSIS — M1990 Unspecified osteoarthritis, unspecified site: Secondary | ICD-10-CM | POA: Insufficient documentation

## 2018-03-10 DIAGNOSIS — M2578 Osteophyte, vertebrae: Secondary | ICD-10-CM | POA: Diagnosis not present

## 2018-03-10 DIAGNOSIS — Z8249 Family history of ischemic heart disease and other diseases of the circulatory system: Secondary | ICD-10-CM | POA: Diagnosis not present

## 2018-03-10 DIAGNOSIS — Z7982 Long term (current) use of aspirin: Secondary | ICD-10-CM | POA: Insufficient documentation

## 2018-03-10 DIAGNOSIS — K219 Gastro-esophageal reflux disease without esophagitis: Secondary | ICD-10-CM | POA: Diagnosis not present

## 2018-03-10 DIAGNOSIS — I10 Essential (primary) hypertension: Secondary | ICD-10-CM | POA: Insufficient documentation

## 2018-03-10 HISTORY — PX: LUMBAR LAMINECTOMY/DECOMPRESSION MICRODISCECTOMY: SHX5026

## 2018-03-10 SURGERY — LUMBAR LAMINECTOMY/DECOMPRESSION MICRODISCECTOMY 3 LEVELS
Anesthesia: General | Site: Back

## 2018-03-10 MED ORDER — BUPIVACAINE-EPINEPHRINE (PF) 0.5% -1:200000 IJ SOLN
INTRAMUSCULAR | Status: AC
  Filled 2018-03-10: qty 30

## 2018-03-10 MED ORDER — LIDOCAINE HCL (PF) 2 % IJ SOLN
INTRAMUSCULAR | Status: AC
  Filled 2018-03-10: qty 10

## 2018-03-10 MED ORDER — FENTANYL CITRATE (PF) 100 MCG/2ML IJ SOLN
INTRAMUSCULAR | Status: AC
  Filled 2018-03-10: qty 2

## 2018-03-10 MED ORDER — METHYLPREDNISOLONE ACETATE 40 MG/ML IJ SUSP
INTRAMUSCULAR | Status: AC
  Filled 2018-03-10: qty 1

## 2018-03-10 MED ORDER — SODIUM CHLORIDE FLUSH 0.9 % IV SOLN
INTRAVENOUS | Status: AC
  Filled 2018-03-10: qty 10

## 2018-03-10 MED ORDER — SENNOSIDES-DOCUSATE SODIUM 8.6-50 MG PO TABS
1.0000 | ORAL_TABLET | Freq: Every evening | ORAL | Status: DC | PRN
  Filled 2018-03-10: qty 1

## 2018-03-10 MED ORDER — HYDROMORPHONE HCL 1 MG/ML IJ SOLN: 0.5000 mg | INTRAMUSCULAR | Status: DC | PRN

## 2018-03-10 MED ORDER — VANCOMYCIN HCL IN DEXTROSE 1-5 GM/200ML-% IV SOLN
1000.0000 mg | Freq: Once | INTRAVENOUS | Status: AC
  Administered 2018-03-10: 1000 mg via INTRAVENOUS

## 2018-03-10 MED ORDER — BUPIVACAINE LIPOSOME 1.3 % IJ SUSP
INTRAMUSCULAR | Status: AC
  Filled 2018-03-10: qty 20

## 2018-03-10 MED ORDER — SODIUM CHLORIDE 0.9 % IV SOLN
INTRAVENOUS | Status: DC
  Administered 2018-03-10 – 2018-03-11 (×2): via INTRAVENOUS

## 2018-03-10 MED ORDER — LATANOPROST 0.005 % OP SOLN
1.0000 [drp] | Freq: Every day | OPHTHALMIC | Status: DC
  Administered 2018-03-10: 1 [drp] via OPHTHALMIC
  Filled 2018-03-10: qty 2.5

## 2018-03-10 MED ORDER — OXYCODONE HCL 5 MG PO TABS: 5.0000 mg | ORAL_TABLET | ORAL | Status: DC | PRN

## 2018-03-10 MED ORDER — BACITRACIN 50000 UNITS IM SOLR
INTRAMUSCULAR | Status: AC
  Filled 2018-03-10: qty 1

## 2018-03-10 MED ORDER — TAMSULOSIN HCL 0.4 MG PO CAPS
0.4000 mg | ORAL_CAPSULE | Freq: Every day | ORAL | Status: DC
  Administered 2018-03-10 – 2018-03-11 (×2): 0.4 mg via ORAL
  Filled 2018-03-10 (×2): qty 1

## 2018-03-10 MED ORDER — THROMBIN 5000 UNITS EX SOLR
CUTANEOUS | Status: AC
  Filled 2018-03-10: qty 5000

## 2018-03-10 MED ORDER — PHENOL 1.4 % MT LIQD
1.0000 | OROMUCOSAL | Status: DC | PRN
  Filled 2018-03-10: qty 177

## 2018-03-10 MED ORDER — GELATIN ABSORBABLE 12-7 MM EX MISC
CUTANEOUS | Status: DC | PRN
  Administered 2018-03-10: 1 via TOPICAL

## 2018-03-10 MED ORDER — PANTOPRAZOLE SODIUM 40 MG PO TBEC
40.0000 mg | DELAYED_RELEASE_TABLET | Freq: Every day | ORAL | Status: DC
  Administered 2018-03-11: 40 mg via ORAL
  Filled 2018-03-10: qty 1

## 2018-03-10 MED ORDER — SODIUM CHLORIDE 0.9 % IJ SOLN
INTRAMUSCULAR | Status: AC
  Filled 2018-03-10: qty 10

## 2018-03-10 MED ORDER — ACETAMINOPHEN 325 MG PO TABS: 650.0000 mg | ORAL_TABLET | ORAL | Status: DC | PRN

## 2018-03-10 MED ORDER — PROPOFOL 10 MG/ML IV BOLUS
INTRAVENOUS | Status: AC
  Filled 2018-03-10: qty 20

## 2018-03-10 MED ORDER — EPHEDRINE SULFATE 50 MG/ML IJ SOLN
INTRAMUSCULAR | Status: AC
  Filled 2018-03-10: qty 1

## 2018-03-10 MED ORDER — BUPIVACAINE HCL (PF) 0.5 % IJ SOLN
INTRAMUSCULAR | Status: AC
  Filled 2018-03-10: qty 30

## 2018-03-10 MED ORDER — GLYCOPYRROLATE 0.2 MG/ML IJ SOLN
INTRAMUSCULAR | Status: AC
  Filled 2018-03-10: qty 1

## 2018-03-10 MED ORDER — LIDOCAINE HCL (CARDIAC) PF 100 MG/5ML IV SOSY
PREFILLED_SYRINGE | INTRAVENOUS | Status: DC | PRN
  Administered 2018-03-10: 60 mg via INTRAVENOUS

## 2018-03-10 MED ORDER — SODIUM CHLORIDE 0.9 % IV SOLN
INTRAVENOUS | Status: DC | PRN
  Administered 2018-03-10: 10 ug/min via INTRAVENOUS

## 2018-03-10 MED ORDER — MIDAZOLAM HCL 2 MG/2ML IJ SOLN
INTRAMUSCULAR | Status: DC | PRN
  Administered 2018-03-10: 1 mg via INTRAVENOUS

## 2018-03-10 MED ORDER — EPHEDRINE SULFATE 50 MG/ML IJ SOLN
INTRAMUSCULAR | Status: DC | PRN
  Administered 2018-03-10: 10 mg via INTRAVENOUS

## 2018-03-10 MED ORDER — SUCCINYLCHOLINE CHLORIDE 20 MG/ML IJ SOLN
INTRAMUSCULAR | Status: AC
  Filled 2018-03-10: qty 1

## 2018-03-10 MED ORDER — ISOSORBIDE MONONITRATE ER 30 MG PO TB24
60.0000 mg | ORAL_TABLET | Freq: Every day | ORAL | Status: DC
  Administered 2018-03-10 – 2018-03-11 (×2): 60 mg via ORAL
  Filled 2018-03-10 (×2): qty 2

## 2018-03-10 MED ORDER — METHOCARBAMOL 1000 MG/10ML IJ SOLN
500.0000 mg | Freq: Four times a day (QID) | INTRAVENOUS | Status: DC | PRN
  Filled 2018-03-10: qty 5

## 2018-03-10 MED ORDER — ACETAMINOPHEN 10 MG/ML IV SOLN
INTRAVENOUS | Status: DC | PRN
  Administered 2018-03-10: 1000 mg via INTRAVENOUS

## 2018-03-10 MED ORDER — ROCURONIUM BROMIDE 100 MG/10ML IV SOLN
INTRAVENOUS | Status: DC | PRN
  Administered 2018-03-10: 5 mg via INTRAVENOUS

## 2018-03-10 MED ORDER — FENTANYL CITRATE (PF) 100 MCG/2ML IJ SOLN
INTRAMUSCULAR | Status: DC | PRN
  Administered 2018-03-10 (×2): 50 ug via INTRAVENOUS

## 2018-03-10 MED ORDER — SODIUM CHLORIDE 0.9 % IR SOLN
Status: DC | PRN
  Administered 2018-03-10: 1000 mL

## 2018-03-10 MED ORDER — ONDANSETRON HCL 4 MG/2ML IJ SOLN
INTRAMUSCULAR | Status: DC | PRN
  Administered 2018-03-10: 4 mg via INTRAVENOUS

## 2018-03-10 MED ORDER — METHOCARBAMOL 500 MG PO TABS
500.0000 mg | ORAL_TABLET | Freq: Four times a day (QID) | ORAL | Status: DC | PRN
  Administered 2018-03-10: 500 mg via ORAL
  Filled 2018-03-10: qty 1

## 2018-03-10 MED ORDER — KETAMINE HCL 50 MG/ML IJ SOLN
INTRAMUSCULAR | Status: DC | PRN
  Administered 2018-03-10: 10 mg via INTRAMUSCULAR
  Administered 2018-03-10: 40 mg via INTRAMUSCULAR
  Administered 2018-03-10 (×2): 10 mg via INTRAMUSCULAR

## 2018-03-10 MED ORDER — AMLODIPINE BESYLATE 5 MG PO TABS
5.0000 mg | ORAL_TABLET | Freq: Every day | ORAL | Status: DC
  Administered 2018-03-10 – 2018-03-11 (×2): 5 mg via ORAL
  Filled 2018-03-10 (×2): qty 1

## 2018-03-10 MED ORDER — BUPIVACAINE HCL (PF) 0.5 % IJ SOLN
INTRAMUSCULAR | Status: DC | PRN
  Administered 2018-03-10: 20 mL

## 2018-03-10 MED ORDER — ARTIFICIAL TEARS OPHTHALMIC OINT
TOPICAL_OINTMENT | Freq: Every day | OPHTHALMIC | Status: DC | PRN
  Filled 2018-03-10: qty 3.5

## 2018-03-10 MED ORDER — GLYCOPYRROLATE 0.2 MG/ML IJ SOLN
INTRAMUSCULAR | Status: DC | PRN
  Administered 2018-03-10: 0.2 mg via INTRAVENOUS

## 2018-03-10 MED ORDER — METHYLPREDNISOLONE ACETATE 40 MG/ML IJ SUSP
INTRAMUSCULAR | Status: DC | PRN
  Administered 2018-03-10: 40 mg

## 2018-03-10 MED ORDER — DEXAMETHASONE SODIUM PHOSPHATE 4 MG/ML IJ SOLN
4.0000 mg | Freq: Three times a day (TID) | INTRAMUSCULAR | Status: DC
  Administered 2018-03-10 – 2018-03-11 (×2): 4 mg via INTRAVENOUS
  Filled 2018-03-10 (×5): qty 1

## 2018-03-10 MED ORDER — KETAMINE HCL 50 MG/ML IJ SOLN
INTRAMUSCULAR | Status: AC
  Filled 2018-03-10: qty 10

## 2018-03-10 MED ORDER — BUPIVACAINE-EPINEPHRINE (PF) 0.5% -1:200000 IJ SOLN
INTRAMUSCULAR | Status: DC | PRN
  Administered 2018-03-10: 7 mL

## 2018-03-10 MED ORDER — ACETAMINOPHEN 500 MG PO TABS
1000.0000 mg | ORAL_TABLET | Freq: Four times a day (QID) | ORAL | Status: AC
  Administered 2018-03-10 – 2018-03-11 (×4): 1000 mg via ORAL
  Filled 2018-03-10 (×4): qty 2

## 2018-03-10 MED ORDER — ONDANSETRON HCL 4 MG/2ML IJ SOLN: 4.0000 mg | Freq: Four times a day (QID) | INTRAMUSCULAR | Status: DC | PRN

## 2018-03-10 MED ORDER — SODIUM CHLORIDE 0.9% FLUSH: 3.0000 mL | INTRAVENOUS | Status: DC | PRN

## 2018-03-10 MED ORDER — SODIUM CHLORIDE 0.9% FLUSH: 3.0000 mL | Freq: Two times a day (BID) | INTRAVENOUS | Status: DC

## 2018-03-10 MED ORDER — NITROGLYCERIN 0.4 MG SL SUBL: 0.4000 mg | SUBLINGUAL_TABLET | SUBLINGUAL | Status: DC | PRN

## 2018-03-10 MED ORDER — HYDROCHLOROTHIAZIDE 25 MG PO TABS
25.0000 mg | ORAL_TABLET | Freq: Every day | ORAL | Status: DC
  Administered 2018-03-10 – 2018-03-11 (×2): 25 mg via ORAL
  Filled 2018-03-10 (×2): qty 1

## 2018-03-10 MED ORDER — SODIUM CHLORIDE 0.9 % IV SOLN
INTRAVENOUS | Status: DC | PRN
  Administered 2018-03-10: 40 mL

## 2018-03-10 MED ORDER — SUCCINYLCHOLINE CHLORIDE 20 MG/ML IJ SOLN
INTRAMUSCULAR | Status: DC | PRN
  Administered 2018-03-10: 100 mg via INTRAVENOUS

## 2018-03-10 MED ORDER — MIDAZOLAM HCL 2 MG/2ML IJ SOLN
INTRAMUSCULAR | Status: AC
  Filled 2018-03-10: qty 2

## 2018-03-10 MED ORDER — ROCURONIUM BROMIDE 50 MG/5ML IV SOLN
INTRAVENOUS | Status: AC
  Filled 2018-03-10: qty 1

## 2018-03-10 MED ORDER — ACETAMINOPHEN 650 MG RE SUPP: 650.0000 mg | RECTAL | Status: DC | PRN

## 2018-03-10 MED ORDER — DEXAMETHASONE SODIUM PHOSPHATE 10 MG/ML IJ SOLN
INTRAMUSCULAR | Status: DC | PRN
  Administered 2018-03-10: 10 mg via INTRAVENOUS

## 2018-03-10 MED ORDER — THROMBIN 5000 UNITS EX SOLR
CUTANEOUS | Status: DC | PRN
  Administered 2018-03-10: 5000 [IU] via TOPICAL

## 2018-03-10 MED ORDER — ATORVASTATIN CALCIUM 20 MG PO TABS
20.0000 mg | ORAL_TABLET | Freq: Every day | ORAL | Status: DC
  Administered 2018-03-10 – 2018-03-11 (×2): 20 mg via ORAL
  Filled 2018-03-10 (×2): qty 1

## 2018-03-10 MED ORDER — DEXAMETHASONE SODIUM PHOSPHATE 10 MG/ML IJ SOLN
INTRAMUSCULAR | Status: AC
  Filled 2018-03-10: qty 1

## 2018-03-10 MED ORDER — IRBESARTAN 150 MG PO TABS
300.0000 mg | ORAL_TABLET | Freq: Every day | ORAL | Status: DC
  Administered 2018-03-10 – 2018-03-11 (×2): 300 mg via ORAL
  Filled 2018-03-10 (×2): qty 2

## 2018-03-10 MED ORDER — VANCOMYCIN HCL IN DEXTROSE 1-5 GM/200ML-% IV SOLN
INTRAVENOUS | Status: AC
  Administered 2018-03-10: 1000 mg via INTRAVENOUS
  Filled 2018-03-10: qty 200

## 2018-03-10 MED ORDER — ACETAMINOPHEN 10 MG/ML IV SOLN
INTRAVENOUS | Status: AC
  Filled 2018-03-10: qty 100

## 2018-03-10 MED ORDER — VALSARTAN-HYDROCHLOROTHIAZIDE 320-25 MG PO TABS: 1.0000 | ORAL_TABLET | Freq: Every day | ORAL | Status: DC

## 2018-03-10 MED ORDER — OXYCODONE HCL 5 MG PO TABS
10.0000 mg | ORAL_TABLET | ORAL | Status: DC | PRN
  Administered 2018-03-11: 10 mg via ORAL
  Filled 2018-03-10: qty 2

## 2018-03-10 MED ORDER — FAMOTIDINE 20 MG PO TABS
10.0000 mg | ORAL_TABLET | Freq: Every day | ORAL | Status: DC
  Administered 2018-03-10 – 2018-03-11 (×2): 10 mg via ORAL
  Filled 2018-03-10 (×2): qty 1

## 2018-03-10 MED ORDER — SODIUM CHLORIDE 0.9 % IV SOLN: 250.0000 mL | INTRAVENOUS | Status: DC

## 2018-03-10 MED ORDER — LACTATED RINGERS IV SOLN
INTRAVENOUS | Status: DC
  Administered 2018-03-10: 10:00:00 via INTRAVENOUS

## 2018-03-10 MED ORDER — PROPOFOL 10 MG/ML IV BOLUS
INTRAVENOUS | Status: DC | PRN
  Administered 2018-03-10: 140 mg via INTRAVENOUS

## 2018-03-10 MED ORDER — DORZOLAMIDE HCL-TIMOLOL MAL 2-0.5 % OP SOLN
1.0000 [drp] | Freq: Two times a day (BID) | OPHTHALMIC | Status: DC
  Administered 2018-03-10 – 2018-03-11 (×2): 1 [drp] via OPHTHALMIC
  Filled 2018-03-10: qty 10

## 2018-03-10 MED ORDER — MENTHOL 3 MG MT LOZG
1.0000 | LOZENGE | OROMUCOSAL | Status: DC | PRN
  Filled 2018-03-10: qty 9

## 2018-03-10 MED ORDER — SODIUM CHLORIDE FLUSH 0.9 % IV SOLN
INTRAVENOUS | Status: AC
  Filled 2018-03-10: qty 30

## 2018-03-10 MED ORDER — ONDANSETRON HCL 4 MG/2ML IJ SOLN
INTRAMUSCULAR | Status: AC
  Filled 2018-03-10: qty 2

## 2018-03-10 MED ORDER — FENTANYL CITRATE (PF) 100 MCG/2ML IJ SOLN: 25.0000 ug | INTRAMUSCULAR | Status: DC | PRN

## 2018-03-10 MED ORDER — ONDANSETRON HCL 4 MG/2ML IJ SOLN: 4.0000 mg | Freq: Once | INTRAMUSCULAR | Status: DC | PRN

## 2018-03-10 MED ORDER — ONDANSETRON HCL 4 MG PO TABS: 4.0000 mg | ORAL_TABLET | Freq: Four times a day (QID) | ORAL | Status: DC | PRN

## 2018-03-10 MED ORDER — PROPOFOL 500 MG/50ML IV EMUL
INTRAVENOUS | Status: DC | PRN
  Administered 2018-03-10: 40 ug/kg/min via INTRAVENOUS

## 2018-03-10 MED ORDER — PROPOFOL 500 MG/50ML IV EMUL
INTRAVENOUS | Status: AC
  Filled 2018-03-10: qty 50

## 2018-03-10 SURGICAL SUPPLY — 53 items
BULB RESERV EVAC DRAIN JP 100C (MISCELLANEOUS) ×3 IMPLANT
BUR NEURO DRILL SOFT 3.0X3.8M (BURR) ×3 IMPLANT
CANISTER SUCT 1200ML W/VALVE (MISCELLANEOUS) ×6 IMPLANT
CHLORAPREP W/TINT 26ML (MISCELLANEOUS) ×6 IMPLANT
CNTNR SPEC 2.5X3XGRAD LEK (MISCELLANEOUS) ×1
CONT SPEC 4OZ STER OR WHT (MISCELLANEOUS) ×2
CONTAINER SPEC 2.5X3XGRAD LEK (MISCELLANEOUS) ×1 IMPLANT
COUNTER NEEDLE 20/40 LG (NEEDLE) ×3 IMPLANT
COVER LIGHT HANDLE STERIS (MISCELLANEOUS) ×6 IMPLANT
COVER WAND RF STERILE (DRAPES) ×6 IMPLANT
CUP MEDICINE 2OZ PLAST GRAD ST (MISCELLANEOUS) ×6 IMPLANT
DERMABOND ADVANCED (GAUZE/BANDAGES/DRESSINGS) ×2
DERMABOND ADVANCED .7 DNX12 (GAUZE/BANDAGES/DRESSINGS) ×1 IMPLANT
DRAIN CHANNEL JP 10F RND 20C F (MISCELLANEOUS) ×3 IMPLANT
DRAPE C-ARM 42X72 X-RAY (DRAPES) ×6 IMPLANT
DRAPE LAPAROTOMY 100X77 ABD (DRAPES) ×3 IMPLANT
DRAPE MICROSCOPE SPINE 48X150 (DRAPES) ×3 IMPLANT
DRAPE SURG 17X11 SM STRL (DRAPES) ×12 IMPLANT
ELECT CAUTERY BLADE TIP 2.5 (TIP) ×3
ELECT EZSTD 165MM 6.5IN (MISCELLANEOUS) ×3
ELECTRODE CAUTERY BLDE TIP 2.5 (TIP) ×1 IMPLANT
ELECTRODE EZSTD 165MM 6.5IN (MISCELLANEOUS) ×1 IMPLANT
FRAME EYE SHIELD (PROTECTIVE WEAR) ×6 IMPLANT
GLOVE BIOGEL PI IND STRL 7.0 (GLOVE) ×1 IMPLANT
GLOVE BIOGEL PI INDICATOR 7.0 (GLOVE) ×2
GLOVE SURG SYN 6.5 ES PF (GLOVE) ×6 IMPLANT
GLOVE SURG SYN 8.5  E (GLOVE) ×6
GLOVE SURG SYN 8.5 E (GLOVE) ×3 IMPLANT
GOWN SRG XL LVL 3 NONREINFORCE (GOWNS) ×1 IMPLANT
GOWN STRL NON-REIN TWL XL LVL3 (GOWNS) ×2
GOWN STRL REUS W/TWL MED LVL3 (GOWN DISPOSABLE) ×3 IMPLANT
GRADUATE 1200CC STRL 31836 (MISCELLANEOUS) ×3 IMPLANT
KIT SPINAL PRONEVIEW (KITS) ×3 IMPLANT
KNIFE BAYONET SHORT DISCETOMY (MISCELLANEOUS) IMPLANT
MARKER SKIN DUAL TIP RULER LAB (MISCELLANEOUS) ×3 IMPLANT
NDL SAFETY ECLIPSE 18X1.5 (NEEDLE) IMPLANT
NEEDLE HYPO 18GX1.5 SHARP (NEEDLE)
NEEDLE HYPO 22GX1.5 SAFETY (NEEDLE) ×3 IMPLANT
NS IRRIG 1000ML POUR BTL (IV SOLUTION) ×3 IMPLANT
PACK LAMINECTOMY NEURO (CUSTOM PROCEDURE TRAY) ×3 IMPLANT
PAD ARMBOARD 7.5X6 YLW CONV (MISCELLANEOUS) ×3 IMPLANT
SPOGE SURGIFLO 8M (HEMOSTASIS) ×2
SPONGE SURGIFLO 8M (HEMOSTASIS) ×1 IMPLANT
SUT DVC VLOC 3-0 CL 6 P-12 (SUTURE) ×3 IMPLANT
SUT VIC AB 0 CT1 27 (SUTURE) ×2
SUT VIC AB 0 CT1 27XCR 8 STRN (SUTURE) ×1 IMPLANT
SUT VIC AB 2-0 CT1 18 (SUTURE) ×3 IMPLANT
SYR 20CC LL (SYRINGE) ×3 IMPLANT
SYR 3ML LL SCALE MARK (SYRINGE) ×3 IMPLANT
TOWEL OR 17X26 4PK STRL BLUE (TOWEL DISPOSABLE) ×9 IMPLANT
TUBE METRX 18MMX5CM (INSTRUMENTS) ×3 IMPLANT
TUBING CONNECTING 10 (TUBING) ×2 IMPLANT
TUBING CONNECTING 10' (TUBING) ×1

## 2018-03-10 NOTE — Progress Notes (Signed)
Nurse tech ambulated pt around nursing station without any complaints.

## 2018-03-10 NOTE — Transfer of Care (Signed)
Immediate Anesthesia Transfer of Care Note  Patient: Austin [name]  Procedure(s) Performed: LUMBAR LAMINECTOMY/DECOMPRESSION MICRODISCECTOMY 3 LEVELS-L2-5 (N/A Back)  Patient Location: PACU  Anesthesia Type:General  Level of Consciousness: awake, patient cooperative and responds to stimulation  Airway & Oxygen Therapy: Patient Spontanous Breathing and Patient connected to face mask oxygen  Post-op Assessment: Report given to RN, Post -op Vital signs reviewed and stable and Patient moving all extremities X 4  Post vital signs: Reviewed and stable  Last Vitals:  Vitals Value Taken Time  BP 144/66 03/10/2018  2:27 PM  Temp 36.1 C 03/10/2018  2:27 PM  Pulse 65 03/10/2018  2:31 PM  Resp 15 03/10/2018  2:31 PM  SpO2 99 % 03/10/2018  2:31 PM  Vitals shown include unvalidated device data.  Last Pain:  Vitals:   03/10/18 0935  TempSrc: Tympanic  PainSc: 2          Complications: No apparent anesthesia complications

## 2018-03-10 NOTE — Anesthesia Preprocedure Evaluation (Signed)
Anesthesia Evaluation  Patient identified by MRN, date of birth, ID band Patient awake    Reviewed: Allergy & Precautions, NPO status , Patient's Chart, lab work & pertinent test results  History of Anesthesia Complications Negative for: history of anesthetic complications  Airway Mallampati: II       Dental   Pulmonary sleep apnea , neg COPD, former smoker,    Pulmonary exam normal        Cardiovascular hypertension, Pt. on medications + angina + CAD, + Past MI, + CABG and + Peripheral Vascular Disease  Normal cardiovascular exam     Neuro/Psych neg Seizures  Neuromuscular disease negative psych ROS   GI/Hepatic Neg liver ROS, GERD  Medicated,  Endo/Other  neg diabetes  Renal/GU Renal InsufficiencyRenal disease (stones)     Musculoskeletal  (+) Arthritis , Osteoarthritis,    Abdominal Normal abdominal exam  (+)   Peds  Hematology  (+) anemia ,   Anesthesia Other Findings   Reproductive/Obstetrics                             Anesthesia Physical  Anesthesia Plan  ASA: III  Anesthesia Plan: General   Post-op Pain Management:    Induction: Intravenous  PONV Risk Score and Plan: 2 and Dexamethasone and Ondansetron  Airway Management Planned: Oral ETT  Additional Equipment:   Intra-op Plan:   Post-operative Plan: Extubation in OR  Informed Consent: I have reviewed the patients History and Physical, chart, labs and discussed the procedure including the risks, benefits and alternatives for the proposed anesthesia with the patient or authorized representative who has indicated his/her understanding and acceptance.     Plan Discussed with: CRNA and Surgeon  Anesthesia Plan Comments:         Anesthesia Quick Evaluation

## 2018-03-10 NOTE — Anesthesia Procedure Notes (Addendum)
Procedure Name: Intubation Date/Time: 03/10/2018 11:07 AM Performed by: Jonna Clark, CRNA Pre-anesthesia Checklist: Patient identified, Patient being monitored, Timeout performed, Emergency Drugs available and Suction available Patient Re-evaluated:Patient Re-evaluated prior to induction Oxygen Delivery Method: Circle system utilized Preoxygenation: Pre-oxygenation with 100% oxygen Induction Type: IV induction Ventilation: Mask ventilation without difficulty Laryngoscope Size: Miller and 2 Grade View: Grade I Tube type: Oral Tube size: 7.0 mm Number of attempts: 1 Airway Equipment and Method: Stylet Placement Confirmation: ETT inserted through vocal cords under direct vision,  positive ETCO2 and breath sounds checked- equal and bilateral Secured at: 22 cm Tube secured with: Tape Dental Injury: Teeth and Oropharynx as per pre-operative assessment

## 2018-03-10 NOTE — Anesthesia Post-op Follow-up Note (Signed)
Anesthesia QCDR form completed.        

## 2018-03-10 NOTE — Progress Notes (Signed)
Procedure: L2-5 lumbar laminectomy Procedure Date: 03/10/2018 Diagnosis; Neurogenic claudication  History: Austin [name] is here for L2-5 lumbar decompression for neurogenic claudication. Tolerated procedure well. Evaluated in post-op recovery and still disoriented from anesthesia but able to answer questions and obey commands. Denies leg pain, but complains that it usually presents walking. Denies any new lower extremity pain, numbness,or tingling. Denies back pain.    Physical Exam: Vitals:   03/10/18 0935  BP: (!) 163/68  Pulse: 81  Resp: 18  Temp: (!) 97.1 F (36.2 C)  SpO2: 100%    AA Ox3 Skin: glue and honeycomb dressing over incision site. Drain site intact and dressed. No bleeding Strength:5/5 throughout lower extremities Sensation: intact and symmetric lower extremities  Data:  No results for input(s): NA, K, CL, CO2, BUN, CREATININE, LABGLOM, GLUCOSE, CALCIUM in the last 168 hours. No results for input(s): AST, ALT, ALKPHOS in the last 168 hours.  Invalid input(s): TBILI   No results for input(s): WBC, HGB, HCT, PLT in the last 168 hours. No results for input(s): APTT, INR in the last 168 hours.       Other tests/results: No imaging reviewed  Assessment/Plan:  Austin [name] is POD0 s/p L2-5 lumbar decompression for neurogenic claudication. Denies lower extremity symptoms but will have him ambulate when tolerated to determine if symptoms have resolved.   - mobilize - pain control - DVT prophylaxis - PTOT - Monitor drain output   Marin Olp PA-C Department of Neurosurgery

## 2018-03-10 NOTE — Op Note (Signed)
Indications: Mr. Douthit is an 84 yo male who presented with neurogenic claudication who failed conservative management.  He elected for surgical intervention.  Findings: severe stenosis L2-5  Preoperative Diagnosis: Lumbar Stenosis with neurogenic claudication Postoperative Diagnosis: same   EBL: 100 ml IVF: 800 ml Drains: 1 subfascial Disposition: Extubated and Stable to PACU Complications: none  No foley catheter was placed.   Preoperative Note:   Risks of surgery discussed include: infection, bleeding, stroke, coma, death, paralysis, CSF leak, nerve/spinal cord injury, numbness, tingling, weakness, complex regional pain syndrome, recurrent stenosis and/or disc herniation, vascular injury, development of instability, neck/back pain, need for further surgery, persistent symptoms, development of deformity, and the risks of anesthesia. The patient understood these risks and agreed to proceed.  Operative Note:   1. L2-5 lumbar decompression including central laminectomy and bilateral medial facetectomies including foraminotomies  The patient was then brought from the preoperative center with intravenous access established.  The patient underwent general anesthesia and endotracheal tube intubation, and was then rotated on the [place] rail top where all pressure points were appropriately padded.  The skin was then thoroughly cleansed.  Perioperative antibiotic prophylaxis was administered.  Sterile prep and drapes were then applied and a timeout was then observed.  C-arm was brought into the field under sterile conditions and under lateral visualization the L2-5 interspaces were identified and marked.  The incision was marked on the right and injected with local anesthetic. Once this was complete a 6 cm incision was opened with the use of a #10 blade knife.    The metrx tubes were sequentially advanced and confirmed in position at L4-5. An 68mm by 85mm tube was locked in place to the bed  side attachment.  The microscope was then sterilely brought into the field and muscle creep was hemostased with a bipolar and resected with a pituitary rongeur.  A Bovie extender was then used to expose the spinous process and lamina.  Careful attention was placed to not violate the facet capsule. A 3 mm matchstick drill bit was then used to make a hemi-laminotomy trough until the ligamentum flavum was exposed.  This was extended to the base of the spinous process and to the contralateral side to remove all the central bone from each side.  Once this was complete and the underlying ligamentum flavum was visualized, it was dissected with a curette and resected with Kerrison rongeurs.  Extensive ligamentum hypertrophy was noted, requiring a substantial amount of time and care for removal.  The dura was identified and palpated. The kerrison rongeur was then used to remove the medial facet bilaterally until no compression was noted.  A balltip probe was used to confirm decompression of the right L5 nerve root.  Additional attention was paid to completion of the contralateral L4-5 foraminotomy until the left L5 nerve root was completely free.  Once this was complete, L4-5 central decompression including medial facetectomy and foraminotomy was confirmed and decompression on both sides was confirmed. No CSF leak was noted.  A Depo-Medrol soaked Gelfoam pledget was placed in the defect.  The wound was copiously irrigated. The tube system was then removed under microscopic visualization and hemostasis was obtained with a bipolar.  The metrx tubes were sequentially advanced and confirmed in position at L3-4. An 73mm by 21mm tube was locked in place to the bed side attachment.  Fluoroscopy was then removed from the field.  The microscope was then sterilely brought into the field and muscle creep was hemostased with a  bipolar and resected with a pituitary rongeur.  A Bovie extender was then used to expose the spinous  process and lamina.  Careful attention was placed to not violate the facet capsule. A 3 mm matchstick drill bit was then used to make a hemi-laminotomy trough until the ligamentum flavum was exposed.  This was extended to the base of the spinous process and to the contralateral side to remove all the central bone from each side.  Once this was complete and the underlying ligamentum flavum was visualized, it was dissected with a curette and resected with Kerrison rongeurs.  Extensive ligamentum hypertrophy was noted, requiring a substantial amount of time and care for removal.  The dura was identified and palpated. The kerrison rongeur was then used to remove the medial facet bilaterally until no compression was noted.  A balltip probe was used to confirm decompression of the right L4 nerve root.  Additional attention was paid to completion of the contralateral L3-4 foraminotomy until the left L4 nerve root was completely free.  Once this was complete, L3-4 central decompression including medial facetectomy and foraminotomy was confirmed and decompression on both sides was confirmed. No CSF leak was noted.  A Depo-Medrol soaked Gelfoam pledget was placed in the defect.  The wound was copiously irrigated. The tube system was then removed under microscopic visualization and hemostasis was obtained with a bipolar.    The metrx tubes were sequentially advanced and confirmed in position at L2-3. An 43mm by 37mm tube was locked in place to the bed side attachment.  Fluoroscopy was then removed from the field.  The microscope was then sterilely brought into the field and muscle creep was hemostased with a bipolar and resected with a pituitary rongeur.  A Bovie extender was then used to expose the spinous process and lamina.  Careful attention was placed to not violate the facet capsule. A 3 mm matchstick drill bit was then used to make a hemi-laminotomy trough until the ligamentum flavum was exposed.  This was  extended to the base of the spinous process and to the contralateral side to remove all the central bone from each side.  Once this was complete and the underlying ligamentum flavum was visualized, it was dissected with a curette and resected with Kerrison rongeurs.  Extensive ligamentum hypertrophy was noted, requiring a substantial amount of time and care for removal.  The dura was identified and palpated. The kerrison rongeur was then used to remove the medial facet bilaterally until no compression was noted.  A balltip probe was used to confirm decompression of the right L3 nerve root.  Additional attention was paid to completion of the contralateral L2-3 foraminotomy until the left L3 nerve root was completely free.  Once this was complete, L2-3 central decompression including medial facetectomy and foraminotomy was confirmed and decompression on both sides was confirmed. No CSF leak was noted.  A Depo-Medrol soaked Gelfoam pledget was placed in the defect.  The wound was copiously irrigated. The tube system was then removed under microscopic visualization and hemostasis was obtained with a bipolar.    A drain was placed under the fascia passing by each interspace.      The fascial layer was reapproximated with the use of a 0 Vicryl suture.  Subcutaneous tissue layer was reapproximated using 2-0 Vicryl suture.  3-0 monocryl was placed in subcuticular fashion. The skin was then cleansed and Dermabond was used to close the skin opening.  Patient was then rotated back to the  preoperative bed awakened from anesthesia and taken to recovery all counts are correct in this case.  I performed the entire procedure with the assistance of Marin Olp PA as an Pensions consultant.  Kaynan Klonowski K. Izora Ribas MD

## 2018-03-10 NOTE — Anesthesia Postprocedure Evaluation (Signed)
Anesthesia Post Note  Patient: Avis Epley  Procedure(s) Performed: LUMBAR LAMINECTOMY/DECOMPRESSION MICRODISCECTOMY 3 LEVELS-L2-5 (N/A Back)  Patient location during evaluation: PACU Anesthesia Type: General Level of consciousness: awake and alert and oriented Pain management: pain level controlled Vital Signs Assessment: post-procedure vital signs reviewed and stable Respiratory status: spontaneous breathing Cardiovascular status: blood pressure returned to baseline Anesthetic complications: no     Last Vitals:  Vitals:   03/10/18 1439 03/10/18 1442  BP:  115/81  Pulse: 61 62  Resp: 12 14  Temp:    SpO2: 100% 100%    Last Pain:  Vitals:   03/10/18 1442  TempSrc:   PainSc: 0-No pain                 Anvitha Hutmacher

## 2018-03-10 NOTE — H&P (Addendum)
History of Present Illness:  03/10/2018 Mr. Dagostino presents today for management of his lumbar stenosis causing neurogenic claudication.  He continues to be symptomatic, and would like to proceed with surgery.  01/28/2018  Mr. Thwaites is now 4 weeks status post anterior cervical discectomy and fusion. He is done very well from this. His arm and hand symptoms have essentially completely resolved. He is very happy with his result. He continues to have symptoms of lumbar stenosis with neurogenic claudication as described below.  11/19/2017 Mr. Thornton returns today to review his MRI scan. He continues to have the same symptoms as below. He has tingling and numbness in his left arm. He has some symptoms of cervical myelopathy. He continues to have severe pain down his legs and neurogenic claudication.  10/15/2017 Mr. Shakil Dirk is here today with a chief complaint of low back pain, occasional numbness/tingling in right foot. He has pain that goes into his posterior buttocks bilaterally. Also complains of tingling down arm to thumb and index finger that comes and goes. He states this alternates from right to left upper extremity.  He previously saw Dr. Mauri Pole in 2011 and was offered surgery, but opted for injections. Those worked very well until recently. He can only walk approximately 1 block without stopping. His posture has gotten progressively stooped over.   Duration: years, worsening this year Quality: dull, stabbing, sharp Severity: 4  Precipitating: aggravated by bending, prolonged standing, rolling over in bed Modifying factors: made better by sitting down, being still, laying on the floor with feet raised Weakness: none Timing: first thing in the morning and worsens as the day goes on Bowel/Bladder Dysfunction: none  Conservative measures:  Physical therapy: has not tried  Multimodal medical therapy including regular antiinflammatories: aleve, tylenol, oxycodone Injections: has tried  epidural steroid injections. He states they used to help, but the last one didn't help.  Past Surgery: denies  He also has issues with pain and tingling into his thumb and index finger bilaterally over the past few months.  Khy Pitre has no symptoms of cervical myelopathy.  The symptoms are causing a significant impact on the patient's life.   Review of Systems:  A 10 point review of systems is negative, except for the pertinent positives and negatives detailed in the HPI.  Past Medical History: Past Medical History:  Diagnosis Date  . ARMD (age related macular degeneration)  pt said he had ARMD taking ARED vitamin, does not know which eye  . CAD (coronary artery disease)  . Carotid atherosclerosis  . GERD (gastroesophageal reflux disease)  . Glaucoma suspect of both eyes  Using drops, but only a suspect per pt.  . Hyperlipidemia  . Hypertension  . Kidney stones  . PVD (peripheral vascular disease) (CMS-HCC)  . VHD (valvular heart disease)   Past Surgical History: Past Surgical History:  Procedure Laterality Date  . C3-6 ACDF 12/30/2017  Dr Meade Maw at [facility]  . COLONOSCOPY 03/21/1999  Adenomatous Polyps, FH Colon Polyps (Father)  . COLONOSCOPY 08/20/2001  PH Adenomatous Polyps, FH Colon Polyps (Father)  . COLONOSCOPY 08/07/2005  Adenomatous Polyps, FH Colon Polyps (Father): CBF 08/2010  . COLONOSCOPY 10/02/2014  PH Adenomatous Polyps, FH Colon Polyps (Father): No repeat due to age per RTE (dw)  . CORONARY ARTERY BYPASS GRAFT  . EGD 03/21/1999, 08/20/2001, 05/02/2002  No repeat per RTE  . knee surgery Right 07/2016  . LENS EYE SURGERY Right 04-04-2014  . LENS EYE SURGERY Left 04-25-2014  . TONSILLECTOMY  Allergies: Allergies as of 01/28/2018 - Reviewed 01/28/2018  Allergen Reaction Noted  . Penicillins Itching, Rash, and Swelling 08/05/2013   Medications: Outpatient Encounter Medications as of 01/28/2018  Medication Sig Dispense Refill  .  methocarbamol (ROBAXIN) 750 MG tablet Take 750 mg by mouth 2 (two) times daily as needed  . nitroGLYcerin (NITROSTAT) 0.4 MG SL tablet DISSOLVE ONE TABLET UNDER THE TONGUE AS NEEDED FOR CHEST PAIN. MAY REPEAT AS INDICATED BY YOUR DOCTOR. 25 tablet 1  . acetaminophen (TYLENOL) 500 MG tablet Take 1,000 mg by mouth every 8 (eight) hours as needed  . amLODIPine (NORVASC) 5 MG tablet TAKE 1 TABLET BY MOUTH DAILY 30 tablet 5  . aspirin 81 MG EC tablet Take 81 mg by mouth once daily.  Marland Kitchen atorvastatin (LIPITOR) 20 MG tablet Take 1 tablet (20 mg total) by mouth nightly 90 tablet 4  . biotin-silicon diox-L-cysteine 5,000 mcg -10 mg-50 mg TbER Take 1 tablet by mouth once daily.   . cholecalciferol (VITAMIN D3) 1,000 unit capsule Take 1,000 Units by mouth once daily.   . coQ10, ubiquinol, 100 mg Cap Take 1 capsule by mouth once daily.  . cyanocobalamin (VITAMIN B12) 1000 MCG tablet Take 1,000 mcg by mouth once daily.  . dorzolamide-timolol (COSOPT) 22.3-6.8 mg/mL ophthalmic solution 1 drop 2 (two) times daily.  . hard/soft/gas permeable prods (SYSTANE CONTACTS) Drop Apply to eye.  . isosorbide mononitrate (IMDUR) 60 MG ER tablet TAKE ONE TABLET BY MOUTH EVERY DAY AS DIRECTED 30 tablet 5  . [EXPIRED] methocarbamol (ROBAXIN) 750 MG tablet Take 1 tablet (750 mg total) by mouth 4 (four) times daily 40 tablet 0  . omega-3 fatty acids/fish oil 340-1,000 mg capsule Take 1 capsule by mouth once daily.   Marland Kitchen omeprazole (PRILOSEC) 20 MG DR capsule TAKE 1 CAPSULE BY MOUTH TWICE DAILY 60 capsule 5  . tamsulosin (FLOMAX) 0.4 mg capsule Take 0.4 mg by mouth once daily. Take 30 minutes after same meal each day.  . TRAVATAN Z 0.004 % ophthalmic solution Place 1 drop into both eyes nightly.   . valsartan-hydrochlorothiazide (DIOVAN-HCT) 320-25 mg tablet TAKE ONE TABLET EVERY DAY 30 tablet 5  . VIT C/E/ZN/COPPR/LUTEIN/ZEAXAN (PRESERVISION AREDS 2 ORAL) Take 1 tablet by mouth 2 (two) times daily.   . [DISCONTINUED] celecoxib  (CELEBREX) 100 MG capsule Take 1 capsule (100 mg total) by mouth 2 (two) times daily 60 capsule 0  . [DISCONTINUED] glucosamine &chondroit-mv-min3 (GLUCOTEN) 375-300-25-0.5 mg Tab Take 1 tablet by mouth 2 (two) times daily.  . [DISCONTINUED] oxyCODONE (ROXICODONE) 5 MG immediate release tablet Take 1 tablet by mouth every 4 (four) hours as needed   No facility-administered encounter medications on file as of 01/28/2018.   Social History: Social History   Tobacco Use  . Smoking status: Former Smoker  Types: Pipe, Landscape architect, Cigarettes  . Smokeless tobacco: Never Used  Substance Use Topics  . Alcohol use: Yes  Alcohol/week: 1.2 oz  Types: 2 Glasses of wine per week  Frequency: 4 or more times a week  Comment: 1-2 glasses of wine with dinner every night  . Drug use: No   Family Medical History: Family History  Problem Relation Age of Onset  . Coronary Artery Disease (Blocked arteries around heart) Mother  . Myocardial Infarction (Heart attack) Mother  . High blood pressure (Hypertension) Mother  . Cataracts Mother  . Heart failure Father  . Myocardial Infarction (Heart attack) Father  . Osteoporosis (Thinning of bones) Father  . Prostate cancer Brother  . Myocardial  Infarction (Heart attack) Maternal Uncle  2 uncles  . High blood pressure (Hypertension) Other  sibling  . Blindness Neg Hx  . Glaucoma Neg Hx  . Macular degeneration Neg Hx   Physical Examination: Vitals:   Vitals:   03/10/18 0935  BP: (!) 163/68  Pulse: 81  Resp: 18  Temp: (!) 97.1 F (36.2 C)  SpO2: 100%     General: Patient is well developed, well nourished, calm, collected, and in no apparent distress. Attention to examination is appropriate.  Psychiatric: Patient is non-anxious.  Head: Pupils equal, round, and reactive to light.  ENT: Oral mucosa appears well hydrated.  Neck: Supple. Full range of motion.  Respiratory: Patient is breathing without any difficulty.  Extremities: No  edema.  Vascular: Palpable dorsal pedal pulses.  Skin: On exposed skin, there are no abnormal skin lesions.  Heart sounds normal no MRG. Chest Clear to Auscultation Bilaterally.   NEUROLOGICAL:  General: In no acute distress.  Awake, alert, oriented to person, place, and time. Speech is clear and fluent. Fund of knowledge is appropriate.   Cranial Nerves: Pupils equal round and reactive to light. Facial tone is symmetric. Facial sensation is symmetric. Shoulder shrug is symmetric. Tongue protrusion is midline. There is no pronator drift.  ROM of spine: diminished extension. Palpation of spine: non tender.   Strength: Side Biceps Triceps Deltoid Interossei Grip Wrist Ext. Wrist Flex.  R 5 5 5 5 5 5 5   L 5 5 5 5 5 5 5    Side Iliopsoas Quads Hamstring PF DF EHL  R 5 5 5 5 5 5   L 5 5 5 5 5 5    Reflexes are 1+ and symmetric at the biceps, triceps, brachioradialis, patella and achilles. Bilateral upper and lower extremity sensation is intact to light touch. Clonus is not present. Toes are down-going. Gait is abnormal and severely stooped. Moderate difficulty with tandem gait. Hoffman's is absent.  Rapid alternating movements are normal.   Medical Decision Making  Imaging: MRI L spine 09/18/2017  IMPRESSION: 1. Degenerative disc bulge with facet hypertrophy at L2-3 through L4-5 with resultant severe diffuse spinal stenosis, most severe at the L4-5 level. 2. Multifactorial degenerative changes with resultant multilevel foraminal narrowing as above. Notable findings include moderate right L2 foraminal narrowing, moderate left with moderate to severe right L3 foraminal stenosis, moderate to severe left L4 foraminal narrowing, and moderate bilateral L5 foraminal stenosis.  Electronically Signed By: BenjaminMcClintock M.D. On: 09/18/2017 22:17  MRI C spine 11/04/2017 IMPRESSION: 1. Disc osteophyte complexes and ligamentum flavum thickening cause spinal stenosis with cord  flattening at C3-4 to C5-6. No cord signal abnormality. 2. Foraminal impingement throughout the cervical spine with the most severe stenoses seen bilaterally at C3-4, on the right at C4-5, and bilaterally at C5-6  Electronically Signed By: Avelina Laine M.D. On: 11/04/2017 13:44  I have personally reviewed the images and agree with the above interpretation. He has moderate to severe central stenosis from C3-6.  Assessment and Plan: Austin [name] is a pleasant 84 y.o. male with neurogenic claudication due to severe lumbar stenosis. This is between L2 and L5. He has tried physical therapy and extensive conservative management without improvement. At this point, I recommended minimally invasive decompression from L2-5. He would like to proceed with this.  Thank you for involving me in the care of this patient.    Meade Maw MD, [facility] Department of Neurosurgery

## 2018-03-11 ENCOUNTER — Encounter: Payer: Self-pay | Admitting: Neurosurgery

## 2018-03-11 DIAGNOSIS — M48062 Spinal stenosis, lumbar region with neurogenic claudication: Secondary | ICD-10-CM | POA: Diagnosis not present

## 2018-03-11 MED ORDER — METHOCARBAMOL 500 MG PO TABS: 500.0000 mg | ORAL_TABLET | Freq: Four times a day (QID) | ORAL | 0 refills | Status: DC | PRN

## 2018-03-11 MED ORDER — OXYCODONE HCL 5 MG PO TABS: 5.0000 mg | ORAL_TABLET | ORAL | 0 refills | Status: AC | PRN

## 2018-03-11 NOTE — Evaluation (Signed)
Occupational Therapy Evaluation Patient Details Name: Austin [name] MRN: [id number] DOB: 18-Oct-1928 Today's Date: 03/11/2018    History of Present Illness 84yo male pt POD#1 s/p L2-5 lumbar decompression for neurogenic claudication.    Clinical Impression   Pt seen for OT evaluation this date, POD#1 from above surgery. Prior to hospital admission, pt was independent with mobility, ADL, and IADL. No falls in past 12 months. However, has been having increased difficulty with all aspects of mobility and ADL due to back pain. Pt lives with spouse in a single family home with 1 step to enter and no handrails with spouse able to provide 24/7 assist/support as needed for pt. Currently pt is at supervision to modified independent level with all aspects of mobility and PRN min assist for LB ADL tasks from spouse. Pt/spouse educated in back precautions with handout provided, self care skills, bed mobility and functional transfer training, AE/DME for bathing, dressing, and toileting needs, and home/routines modifications and falls prevention strategies to maximize safety and functional independence while minimizing falls risk and maintaining precautions. Pt/spouse verbalized understanding of all education/training provided. Handout provided to support recall and carry over of learned precautions/techniques for bed mobility, functional transfers, and self care skills. No additional skilled OT needs at this time. Will discharge in house. Upon hospital discharge, pt safe to discharge home.    Follow Up Recommendations  No OT follow up    Equipment Recommendations  None recommended by OT    Recommendations for Other Services       Precautions / Restrictions Precautions Precautions: Back Precaution Booklet Issued: Yes (comment) Precaution Comments: no bending, arching, twisting - handout provided; no brace Restrictions Weight Bearing Restrictions: No      Mobility Bed Mobility Overal bed mobility:  Modified Independent             General bed mobility comments: pt able to return demo safe log roll technique during multiple trials after initial instruction to max adherence to back precautions  Transfers Overall transfer level: Modified independent Equipment used: None             General transfer comment: supervision for transfers with good body mechanics/safety    Balance Overall balance assessment: No apparent balance deficits (not formally assessed)                                         ADL either performed or assessed with clinical judgement   ADL Overall ADL's : Needs assistance/impaired Eating/Feeding: Sitting;Independent   Grooming: Sitting;Independent   Upper Body Bathing: Sitting;Supervision/ safety;With caregiver independent assisting   Lower Body Bathing: Sit to/from stand;Minimal assistance;With caregiver independent assisting   Upper Body Dressing : Sitting;Independent   Lower Body Dressing: Sit to/from stand;Minimal assistance;With caregiver independent assisting Lower Body Dressing Details (indicate cue type and reason): pt/spouse instructed in use of AE for LB dressing including positioning as well to maintain back precautions and max safety/indep Toilet Transfer: Comfort height toilet;Ambulation;Min guard           Functional mobility during ADLs: Min guard;Supervision/safety       Vision Baseline Vision/History: Wears glasses Wears Glasses: Reading only Patient Visual Report: No change from baseline       Perception     Praxis      Pertinent Vitals/Pain Pain Assessment: No/denies pain     Hand Dominance Right   Extremity/Trunk Assessment Upper Extremity  Assessment Upper Extremity Assessment: Overall WFL for tasks assessed   Lower Extremity Assessment Lower Extremity Assessment: Overall WFL for tasks assessed   Cervical / Trunk Assessment Cervical / Trunk Assessment: Normal   Communication  Communication Communication: HOH(wears heading aides)   Cognition Arousal/Alertness: Awake/alert Behavior During Therapy: WFL for tasks assessed/performed Overall Cognitive Status: Within Functional Limits for tasks assessed                                     General Comments  drain in place    Exercises Other Exercises Other Exercises: pt/spouse instructed in back precautions and how to implement during functional mobility and ADL/IADL tasks. Handout provided. Pt/spouse verbalized understanding to all education/training provided.    Shoulder Instructions      Home Living Family/patient expects to be discharged to:: Private residence Living Arrangements: Spouse/significant other Available Help at Discharge: Family Type of Home: House Home Access: Stairs to enter Technical brewer of Steps: 1 Entrance Stairs-Rails: None Home Layout: One level     Bathroom Shower/Tub: Occupational psychologist: Handicapped height     Home Equipment: Civil engineer, contracting;Adaptive equipment Adaptive Equipment: Reacher;Sock aid;Long-handled shoe horn        Prior Functioning/Environment Level of Independence: Independent        Comments: Pt indep prior to admission, no falls, enjoyed yardwork, golfing, and house projects, volunteers. Increased difficulty with these things 2/2 back pain.        OT Problem List:        OT Treatment/Interventions:      OT Goals(Current goals can be found in the care plan section) Acute Rehab OT Goals Patient Stated Goal: go home and get back to what I was doing before the back pain OT Goal Formulation: All assessment and education complete, DC therapy  OT Frequency:     Barriers to D/C:            Co-evaluation              AM-PAC PT "6 Clicks" Daily Activity     Outcome Measure Help from another person eating meals?: None Help from another person taking care of personal grooming?: None Help from another person  toileting, which includes using toliet, bedpan, or urinal?: None Help from another person bathing (including washing, rinsing, drying)?: A Little Help from another person to put on and taking off regular upper body clothing?: None Help from another person to put on and taking off regular lower body clothing?: A Little 6 Click Score: 22   End of Session Equipment Utilized During Treatment: Gait belt  Activity Tolerance: Patient tolerated treatment well Patient left: in bed;with call bell/phone within reach;with bed alarm set;with family/visitor present  OT Visit Diagnosis: Other abnormalities of gait and mobility (R26.89)                Time: 7517-0017 OT Time Calculation (min): 32 min Charges:  OT General Charges $OT Visit: 1 Visit OT Evaluation $OT Eval Low Complexity: 1 Low OT Treatments $Self Care/Home Management : 8-22 mins  Jeni Salles, MPH, MS, OTR/L ascom [phone] 03/11/18, 9:59 AM

## 2018-03-11 NOTE — Care Management (Signed)
RNCM met with patient and his wife. He is able to ambuate without assistance at baseline and currently per patient.  He has a cane available at home but doesn't use it.  He has been to outpatient PT in the past. He has no problems with transportation. No identified RNCM needs. MOON letter explained to patient. He had not questions.

## 2018-03-11 NOTE — Discharge Summary (Signed)
Procedure: L2-5 lumbar laminectomy Procedure Date: 03/10/2018 Diagnosis; Neurogenic claudication  History: POD1 update:  Drain output 30 ml since last drain emptying.  Continues to feel well without complaints.   POD1: POD1 and recovering well. Drain output overnight 80. He has ambulated, voided, and eaten without issue. Feels significant improvement in lower extremities. Denies leg pain/numbness/tingling/weakness. Denies back pain.   POD0: Austin [name] is here for L2-5 lumbar decompression for neurogenic claudication. Tolerated procedure well. Evaluated in post-op recovery and still disoriented from anesthesia but able to answer questions and obey commands. Denies leg pain, but complains that it usually presents walking. Denies any new lower extremity pain, numbness,or tingling. Denies back pain.    Physical Exam: Vitals:   03/11/18 0817 03/11/18 1158  BP: (!) 115/59 115/63  Pulse: 76 70  Resp: 15   Temp: 98 F (36.7 C) 98 F (36.7 C)  SpO2: 97% 97%    AA Ox3 Skin: glue and honeycomb dressing over incision site. Drain site intact and dressed. No bleeding Strength:5/5 throughout lower extremities Sensation: intact and symmetric lower extremities  Data:  No results for input(s): NA, K, CL, CO2, BUN, CREATININE, LABGLOM, GLUCOSE, CALCIUM in the last 168 hours. No results for input(s): AST, ALT, ALKPHOS in the last 168 hours.  Invalid input(s): TBILI   No results for input(s): WBC, HGB, HCT, PLT in the last 168 hours. No results for input(s): APTT, INR in the last 168 hours.       Other tests/results: No imaging reviewed  Assessment/Plan:  Austin [name] is POD0 s/p L2-5 lumbar decompression for neurogenic claudication. Pain adequately controlled. Symptoms prior to surgery seem to be resolved.  Drain removed without issue. Site dressed with 2x2 and tegaderm. Honeycomb over incision site removed - glue intact. Will continue pain control with tylenol, robaxin, and  oxycodone as needed. He will follow up in clinic in 2 weeks to monitor progress.   Marin Olp PA-C Department of Neurosurgery

## 2018-03-11 NOTE — Progress Notes (Signed)
Procedure: L2-5 lumbar laminectomy Procedure Date: 03/10/2018 Diagnosis; Neurogenic claudication  History: POD1: POD1 and recovering well. Drain output overnight 80. He has ambulated, voided, and eaten without issue. Feels significant improvement in lower extremities. Denies leg pain/numbness/tingling/weakness. Denies back pain.   POD0: Austin [name] is here for L2-5 lumbar decompression for neurogenic claudication. Tolerated procedure well. Evaluated in post-op recovery and still disoriented from anesthesia but able to answer questions and obey commands. Denies leg pain, but complains that it usually presents walking. Denies any new lower extremity pain, numbness,or tingling. Denies back pain.    Physical Exam: Vitals:   03/11/18 0317 03/11/18 0817  BP: (!) 102/58 (!) 115/59  Pulse: 86 76  Resp: 14 15  Temp: 98.7 F (37.1 C) 98 F (36.7 C)  SpO2: 96% 97%    AA Ox3 Skin: glue and honeycomb dressing over incision site. Drain site intact and dressed. No bleeding Strength:5/5 throughout lower extremities Sensation: intact and symmetric lower extremities  Data:  No results for input(s): NA, K, CL, CO2, BUN, CREATININE, LABGLOM, GLUCOSE, CALCIUM in the last 168 hours. No results for input(s): AST, ALT, ALKPHOS in the last 168 hours.  Invalid input(s): TBILI   No results for input(s): WBC, HGB, HCT, PLT in the last 168 hours. No results for input(s): APTT, INR in the last 168 hours.       Other tests/results: No imaging reviewed  Assessment/Plan:  Austin [name] is POD0 s/p L2-5 lumbar decompression for neurogenic claudication. Pain adequately controlled. Symptoms prior to surgery seem to be resolved.  Will monitor drain output through the day.   - mobilize - pain control - DVT prophylaxis - PTOT - Monitor drain output   Marin Olp PA-C Department of Neurosurgery

## 2018-03-11 NOTE — Progress Notes (Signed)
PT Cancellation Note  Patient Details Name: Austin [name] MRN: [id number] DOB: 15-Mar-1929   Cancelled Treatment:    Reason Eval/Treat Not Completed: discussion screen Pt was able to ambulate around the nurses' station last night w/o issue.  Spoke with his nurse this AM who states that he has continued to do well, spoke with PA as well who states he is doing fine and that a simple PT screen is suffice.   Spoke to pt and wife who are both justifiably confident about being able to manage at home.  Pt had just gotten breakfast and did not feel like getting up for another walk though he was confident that he could if he needed.  Pt has had multiple surgeries in the past that he has recovered well from, no further acute PT needs at this time.  Follow up per surgeon. Kreg Shropshire, DPT 03/11/2018, 8:55 AM

## 2018-03-11 NOTE — Care Management Obs Status (Signed)
[facility] NOTIFICATION   Patient Details  Name: Austin [name] MRN: [id number] Date of Birth: 1928-10-13   Medicare Observation Status Notification Given:  Yes    Marshell Garfinkel, RN 03/11/2018, 11:33 AM

## 2018-03-11 NOTE — Discharge Instructions (Signed)
Your surgeon has performed an operation on your lumbar spine (low back) to relieve pressure on one or more nerves. Many times, patients feel better immediately after surgery and can overdo it. Even if you feel well, it is important that you follow these activity guidelines. If you do not let your back heal properly from the surgery, you can increase the chance of a disc herniation and/or return of your symptoms. The following are instructions to help in your recovery once you have been discharged from the hospital.  * Do not take anti-inflammatory medications for 3 days after surgery (naproxen [Aleve], ibuprofen [Advil, Motrin], celecoxib [Celebrex], etc.) You may resume aspirin and omega 3 supplement after 7 days  Activity    No bending, lifting, or twisting (BLT). Avoid lifting objects heavier than 10 pounds (gallon milk jug).  Where possible, avoid household activities that involve lifting, bending, pushing, or pulling such as laundry, vacuuming, grocery shopping, and childcare. Try to arrange for help from friends and family for these activities while your back heals.  Increase physical activity slowly as tolerated.  Taking short walks is encouraged, but avoid strenuous exercise. Do not jog, run, bicycle, lift weights, or participate in any other exercises unless specifically allowed by your doctor. Avoid prolonged sitting, including car rides.  Talk to your doctor before resuming sexual activity.  You should not drive until cleared by your doctor.  Until released by your doctor, you should not return to work or school.  You should rest at home and let your body heal.   You may shower two days after your surgery.  After showering, lightly dab your incision dry. Do not take a tub bath or go swimming for 3 weeks, or until approved by your doctor at your follow-up appointment.  If you smoke, we strongly recommend that you quit.  Smoking has been proven to interfere with normal healing in  your back and will dramatically reduce the success rate of your surgery. Please contact [facility] (800-QUIT-NOW) and use the resources at www.[facility].com for assistance in stopping smoking.  Surgical Incision   If you have a dressing on your incision, you may remove it three days after your surgery. Keep your incision area clean and dry.  If you have staples or stitches on your incision, you should have a follow up scheduled for removal. If you do not have staples or stitches, you will have steri-strips (small pieces of surgical tape) or Dermabond glue. The steri-strips/glue should begin to peel away within about a week (it is fine if the steri-strips fall off before then). If the strips are still in place one week after your surgery, you may gently remove them.  Diet            You may return to your usual diet. Be sure to stay hydrated.  When to Contact us  Although your surgery and recovery will likely be uneventful, you may have some residual numbness, aches, and pains in your back and/or legs. This is normal and should improve in the next few weeks.  However, should you experience any of the following, contact us immediately:  New numbness or weakness  Pain that is progressively getting worse, and is not relieved by your pain medications or rest  Bleeding, redness, swelling, pain, or drainage from surgical incision  Chills or flu-like symptoms  Fever greater than 101.0 F (38.3 C)  Problems with bowel or bladder functions  Difficulty breathing or shortness of breath  Warmth, tenderness, or swelling  in your calf  Contact Information  During office hours (Monday-Friday 9 am to 5 pm), please call your physician at [phone]  After hours and weekends, please call the [facility] Operator at [phone] and ask for the Neurosurgery Resident On Call   For a life-threatening emergency, call 911

## 2018-03-15 ENCOUNTER — Ambulatory Visit: Payer: Self-pay | Admitting: Family Medicine

## 2018-03-22 DIAGNOSIS — H401132 Primary open-angle glaucoma, bilateral, moderate stage: Secondary | ICD-10-CM | POA: Diagnosis not present

## 2018-03-22 DIAGNOSIS — H353132 Nonexudative age-related macular degeneration, bilateral, intermediate dry stage: Secondary | ICD-10-CM | POA: Diagnosis not present

## 2018-03-25 DIAGNOSIS — Z981 Arthrodesis status: Secondary | ICD-10-CM | POA: Diagnosis not present

## 2018-03-25 DIAGNOSIS — M4322 Fusion of spine, cervical region: Secondary | ICD-10-CM | POA: Diagnosis not present

## 2018-03-25 DIAGNOSIS — G959 Disease of spinal cord, unspecified: Secondary | ICD-10-CM | POA: Diagnosis not present

## 2018-05-20 DIAGNOSIS — M4322 Fusion of spine, cervical region: Secondary | ICD-10-CM | POA: Diagnosis not present

## 2018-05-20 DIAGNOSIS — G959 Disease of spinal cord, unspecified: Secondary | ICD-10-CM | POA: Diagnosis not present

## 2018-05-20 DIAGNOSIS — Z981 Arthrodesis status: Secondary | ICD-10-CM | POA: Diagnosis not present

## 2018-06-01 ENCOUNTER — Ambulatory Visit (INDEPENDENT_AMBULATORY_CARE_PROVIDER_SITE_OTHER): Payer: PPO | Admitting: Vascular Surgery

## 2018-06-01 ENCOUNTER — Ambulatory Visit (INDEPENDENT_AMBULATORY_CARE_PROVIDER_SITE_OTHER): Payer: PPO

## 2018-06-01 ENCOUNTER — Encounter (INDEPENDENT_AMBULATORY_CARE_PROVIDER_SITE_OTHER): Payer: PPO

## 2018-06-01 ENCOUNTER — Encounter (INDEPENDENT_AMBULATORY_CARE_PROVIDER_SITE_OTHER): Payer: Self-pay | Admitting: Vascular Surgery

## 2018-06-01 ENCOUNTER — Other Ambulatory Visit: Payer: Self-pay

## 2018-06-01 VITALS — BP 155/64 | HR 61 | Resp 14 | Ht 66.0 in | Wt 169.0 lb

## 2018-06-01 DIAGNOSIS — I10 Essential (primary) hypertension: Secondary | ICD-10-CM | POA: Diagnosis not present

## 2018-06-01 DIAGNOSIS — E785 Hyperlipidemia, unspecified: Secondary | ICD-10-CM

## 2018-06-01 DIAGNOSIS — I6523 Occlusion and stenosis of bilateral carotid arteries: Secondary | ICD-10-CM | POA: Diagnosis not present

## 2018-06-01 DIAGNOSIS — G9519 Other vascular myelopathies: Secondary | ICD-10-CM

## 2018-06-01 DIAGNOSIS — M48062 Spinal stenosis, lumbar region with neurogenic claudication: Secondary | ICD-10-CM | POA: Diagnosis not present

## 2018-06-01 DIAGNOSIS — Z87891 Personal history of nicotine dependence: Secondary | ICD-10-CM

## 2018-06-01 NOTE — Assessment & Plan Note (Signed)
Somewhat better after his back surgery

## 2018-06-01 NOTE — Progress Notes (Signed)
MRN : [id number]  Austin [name] is a 84 y.o. (May 28, 1929) male who presents with chief complaint of  Chief Complaint  Patient presents with  . Follow-up    ultrasound  .  History of Present Illness: Patient returns in follow-up of his carotid disease.  Since his last visit, he has had a neck surgery and a lower back surgery.  He has done fairly well from both of these.  He has not had any focal neurologic symptoms.  His carotid duplex today shows velocities in the upper end of the 40 to 59% range bilaterally.  This is a little lower than previously seen and there has clearly not been significant progression from his previous study.  Current Outpatient Medications  Medication Sig Dispense Refill  . amLODipine (NORVASC) 5 MG tablet Take 5 mg by mouth daily.    Marland Kitchen atorvastatin (LIPITOR) 20 MG tablet Take 20 mg by mouth daily.     . Biotin 5000 MCG CAPS Take 5,000 mcg by mouth daily.     . Cholecalciferol (VITAMIN D3) 2000 UNITS capsule Take 2,000 Units by mouth 2 (two) times daily.     . Coenzyme Q10 (COQ-10) 200 MG CAPS Take 200 mg by mouth daily.    . dorzolamide-timolol (COSOPT) 22.3-6.8 MG/ML ophthalmic solution Place 1 drop into both eyes 2 (two) times daily.     . isosorbide mononitrate (IMDUR) 60 MG 24 hr tablet Take 60 mg by mouth daily.     . Melatonin 5 MG CAPS Take 5 mg by mouth at bedtime as needed (for sleep).     . Multiple Vitamins-Minerals (PRESERVISION AREDS 2) CAPS Take 1 capsule by mouth 2 (two) times daily.    . nitroGLYCERIN (NITROSTAT) 0.4 MG SL tablet Place 0.4 mg under the tongue every 5 (five) minutes as needed for chest pain.     . Omega-3 Fatty Acids (FISH OIL) 1200 MG CAPS Take 1,200 mg by mouth daily.     Marland Kitchen omeprazole (PRILOSEC) 20 MG capsule Take 20 mg by mouth daily.     Vladimir Faster Glycol-Propyl Glycol (SYSTANE OP) Place 1 drop into the right eye daily as needed (for blurriness).     . tamsulosin (FLOMAX) 0.4 MG CAPS capsule TAKE 1 CAPSULE BY MOUTH EVERY  DAY (Patient taking differently: Take 0.4 mg by mouth daily. ) 30 capsule 11  . Travoprost, BAK Free, (TRAVATAN Z) 0.004 % SOLN ophthalmic solution Place 1 drop into both eyes at bedtime.     . Turmeric 500 MG TABS Take 1,000 mg by mouth daily.    . valsartan-hydrochlorothiazide (DIOVAN HCT) 320-25 MG per tablet Take 1 tablet by mouth daily.     . vitamin B-12 (CYANOCOBALAMIN) 1000 MCG tablet Take 1,000 mcg by mouth daily.    . Glucosamine HCl (GLUCOSAMINE PO) Take 1 tablet by mouth 2 (two) times daily.    . Liniments (SALONPAS PAIN RELIEF PATCH EX) Apply 1 patch topically daily as needed (for pain).    . methocarbamol (ROBAXIN) 500 MG tablet Take 1 tablet (500 mg total) by mouth every 6 (six) hours as needed for muscle spasms. (Patient not taking: Reported on 06/01/2018) 90 tablet 0  . ranitidine (ZANTAC) 150 MG capsule Take 1 capsule (150 mg total) by mouth every evening. (Patient not taking: Reported on 02/22/2018) 30 capsule 5   No current facility-administered medications for this visit.     Past Medical History:  Diagnosis Date  . Absolute anemia 12/08/2014  . Allergy   .  Anginal pain ([place])    much less angina d/t lack of activity  . Arthritis    all over body  . Bradycardia 05/03/2015  . Calculus of kidney 12/08/2014  . Coronary artery disease   . GERD (gastroesophageal reflux disease)   . Glaucoma   . History of kidney stones   . HLD (hyperlipidemia)   . Hyperglycemia 08/27/2015  . Hypertension   . Myocardial infarction ([place]) 2011  . Peripheral vascular disease ([place])   . VHD (valvular heart disease)     Past Surgical History:  Procedure Laterality Date  . ANTERIOR CERVICAL DECOMP/DISCECTOMY FUSION N/A 12/30/2017   Procedure: ANTERIOR CERVICAL DECOMPRESSION/DISCECTOMY FUSION 3 LEVELS- C3-6;  Surgeon: Meade Maw, MD;  Location: [facility];  Service: Neurosurgery;  Laterality: N/A;  . CARDIAC CATHETERIZATION  2007  . CARDIAC CATHETERIZATION N/A 04/17/2015   Procedure: Left  Heart Cath and Coronary Angiography;  Surgeon: Corey Skains, MD;  Location: [facility];  Service: Cardiovascular;  Laterality: N/A;  . CARPAL TUNNEL RELEASE Bilateral    right 06/09/08 left 05/03/09  . CATARACT EXTRACTION W/ INTRAOCULAR LENS  IMPLANT, BILATERAL    . COLONOSCOPY N/A 10/02/2014   Procedure: COLONOSCOPY;  Surgeon: Manya Silvas, MD;  Location: [facility];  Service: Endoscopy;  Laterality: N/A;  . CORONARY ARTERY BYPASS GRAFT  2007  . ESOPHAGOGASTRODUODENOSCOPY    . EYE SURGERY    . HEMORROIDECTOMY    . JOINT REPLACEMENT Right 03/2017   partial knee replacement  . KNEE ARTHROSCOPY Right 08/05/2016   Procedure: ARTHROSCOPY KNEE;  Surgeon: Thornton Park, MD;  Location: [facility] ORS;  Service: Orthopedics;  Laterality: Right;  . KNEE ARTHROSCOPY WITH LATERAL RELEASE Right 08/05/2016   Procedure: KNEE ARTHROSCOPY WITH LATERAL RELEASE;  Surgeon: Thornton Park, MD;  Location: [facility];  Service: Orthopedics;  Laterality: Right;  . KNEE ARTHROSCOPY WITH MEDIAL MENISECTOMY Right 08/05/2016   Procedure: KNEE ARTHROSCOPY WITH MEDIAL MENISECTOMY;  Surgeon: Thornton Park, MD;  Location: [facility];  Service: Orthopedics;  Laterality: Right;  Partial  . LUMBAR EPIDURAL INJECTION  2011  . LUMBAR LAMINECTOMY/DECOMPRESSION MICRODISCECTOMY N/A 03/10/2018   Procedure: LUMBAR LAMINECTOMY/DECOMPRESSION MICRODISCECTOMY 3 LEVELS-L2-5;  Surgeon: Meade Maw, MD;  Location: [facility];  Service: Neurosurgery;  Laterality: N/A;  . MOUTH SURGERY     tooth implant  . SHOULDER SURGERY Right 2011   rotator cuff repair  . SYNOVECTOMY Right 08/05/2016   Procedure: SYNOVECTOMY;  Surgeon: Thornton Park, MD;  Location: [facility]S;  Service: Orthopedics;  Laterality: Right;  arthroscopic extensive synovectomy  . TONSILLECTOMY      Social History  Substance Use Topics  . Smoking status: Former Smoker    Years: 15.00    Types: Cigarettes  . Smokeless tobacco: Never Used  . Alcohol use Yes       Comment: 1-2 glasses of wine a night    Family History      Family History  Problem Relation Age of Onset  . Heart disease Mother   . Heart disease Father   . Heart disease Brother   . Heart disease Brother   . Heart disease Brother   . Heart disease Brother          Allergies  Allergen Reactions  . Penicillins Itching, Swelling and Rash    Has patient had a PCN reaction causing immediate rash, facial/tongue/throat swelling, SOB or lightheadedness with hypotension: Yes Has patient had a PCN reaction causing severe rash involving mucus membranes or skin necrosis: No Has patient  had a PCN reaction that required hospitalization No Has patient had a PCN reaction occurring within the last 10 years: No If all of the above answers are "NO", then may proceed with Cephalosporin use.      REVIEW OF SYSTEMS(Negative unless checked)  Constitutional: [] ?Weight loss[] ?Fever[] ?Chills Cardiac:[] ?Chest pain[] ?Chest pressure[] ?Palpitations [] ?Shortness of breath when laying flat [] ?Shortness of breath at rest [] ?Shortness of breath with exertion. Vascular: [] ?Pain in legs with walking[] ?Pain in legsat rest[] ?Pain in legs when laying flat [] ?Claudication [] ?Pain in feet when walking [] ?Pain in feet at rest [] ?Pain in feet when laying flat [] ?History of DVT [] ?Phlebitis [] ?Swelling in legs [] ?Varicose veins [] ?Non-healing ulcers Pulmonary: [] ?Uses home oxygen [] ?Productive cough[] ?Hemoptysis [] ?Wheeze [] ?COPD [] ?Asthma Neurologic: [] ?Dizziness [] ?Blackouts [] ?Seizures [] ?History of stroke [] ?History of TIA[] ?Aphasia [] ?Temporary blindness[] ?Dysphagia [] ?Weaknessor numbness in arms [] ?Weakness or numbnessin legs Musculoskeletal: [x] ?Arthritis [] ?Joint swelling [] ?Joint pain [] ?Low back pain Hematologic:[] ?Easy bruising[] ?Easy bleeding [] ?Hypercoagulable state [] ?Anemic  [] ?Hepatitis Gastrointestinal:[] ?Blood in stool[] ?Vomiting blood[] ?Gastroesophageal reflux/heartburn[] ?Difficulty swallowing. Genitourinary: [] ?Chronic kidney disease [] ?Difficulturination [] ?Frequenturination [] ?Burning with urination[] ?Blood in urine Skin: [] ?Rashes [] ?Ulcers [] ?Wounds Psychological: [] ?History of anxiety[] ?History of major depression.    Physical Examination  Vitals:   06/01/18 1357 06/01/18 1358  BP: (!) 152/65 (!) 155/64  Pulse:  61  Resp:  14  Weight:  169 lb (76.7 kg)  Height:  5\' 6"  (1.676 m)   Body mass index is 27.28 kg/m. Gen:  WD/WN, NAD Head: [place]/AT, No temporalis wasting. Ear/Nose/Throat: Hearing grossly intact, nares w/o erythema or drainage, trachea midline Eyes: Conjunctiva clear. Sclera non-icteric Neck: Supple.  Soft bilateral bruit  Pulmonary:  Good air movement, equal and clear to auscultation bilaterally.  Cardiac: RRR, No JVD Vascular:  Vessel Right Left  Radial Palpable Palpable                   Musculoskeletal: M/S 5/5 throughout.  No deformity or atrophy. No significant LE edema. Neurologic: CN 2-12 intact. Sensation grossly intact in extremities.  Symmetrical.  Speech is fluent. Motor exam as listed above. Psychiatric: Judgment intact, Mood & affect appropriate for pt's clinical situation. Dermatologic: No rashes or ulcers noted.  No cellulitis or open wounds.      CBC Lab Results  Component Value Date   WBC 7.4 03/02/2018   HGB 12.3 (L) 03/02/2018   HCT 34.4 (L) 03/02/2018   MCV 90.3 03/02/2018   PLT 180 03/02/2018    BMET    Component Value Date/Time   NA 136 03/02/2018 1216   NA 139 02/22/2018 1020   NA 137 05/09/2013 0912   K 3.9 03/02/2018 1216   K 4.4 05/09/2013 0912   CL 103 03/02/2018 1216   CL 103 05/09/2013 0912   CO2 25 03/02/2018 1216   CO2 29 05/09/2013 0912   GLUCOSE 108 (H) 03/02/2018 1216   GLUCOSE 93 05/09/2013 0912   BUN 28 (H) 03/02/2018 1216   BUN 22  02/22/2018 1020   BUN 26 (H) 05/09/2013 0912   CREATININE 1.44 (H) 03/02/2018 1216   CREATININE 1.50 (H) 03/05/2017 1036   CALCIUM 9.2 03/02/2018 1216   CALCIUM 9.2 05/09/2013 0912   GFRNONAA 42 (L) 03/02/2018 1216   GFRNONAA 43 (L) 05/09/2013 0912   GFRAA 49 (L) 03/02/2018 1216   GFRAA 50 (L) 05/09/2013 0912   CrCl cannot be calculated (Patient's most recent lab result is older than the maximum 21 days allowed.).  COAG Lab Results  Component Value Date   INR 1.04 03/02/2018   INR 1.10 12/23/2017   INR 1.03 07/28/2016  Radiology No results found.   Assessment/Plan Benign essential HTN blood pressure control important in reducing the progression of atherosclerotic disease. On appropriate oral medications.   HLD (hyperlipidemia) lipid control important in reducing the progression of atherosclerotic disease. Continue statin therapy  Neurogenic claudication Somewhat better after his back surgery  Carotid stenosis His carotid duplex today shows velocities in the upper end of the 40 to 59% range bilaterally.  This is a little lower than previously seen and there has clearly not been significant progression from his previous study. No role for surgery or intervention at this level.  Would recommend 59-month follow-up interval at this point with continued medical management.    Leotis Pain, MD  06/01/2018 2:50 PM    This note was created with Dragon medical transcription system.  Any errors from dictation are purely unintentional

## 2018-06-01 NOTE — Patient Instructions (Signed)
Carotid Artery Disease  The carotid arteries are arteries on both sides of the neck. They carry blood to the brain, face, and neck. Carotid artery disease happens when these arteries become smaller (narrow) or get blocked. If these arteries become smaller or get blocked, you are more likely to have a stroke or a warning stroke (transient ischemic attack). Follow these instructions at home:  Take over-the-counter and prescription medicines only as told by your doctor.  Make sure you understand all instructions about your medicines. Do not stop taking your medicines without talking to your doctor first.  Follow your doctor's diet instructions. It is important to follow a healthy diet. ? Eat foods that include plenty of: ? Fresh fruits. ? Vegetables. ? Lean meats. ? Avoid these foods: ? Foods that are high in fat. ? Foods that are high in salt (sodium). ? Foods that are fried. ? Foods that are processed. ? Foods that have few good nutrients (poor nutritional value).  Keep a healthy weight.  Stay active. Get at least 30 minutes of activity every day.  Do not smoke.  Limit alcohol use to: ? No more than 2 drinks a day for men. ? No more than 1 drink a day for women who are not pregnant.  Do not use illegal drugs.  Keep all follow-up visits as told by your doctor. This is important. Contact a doctor if: Get help right away if:  You have any symptoms of stroke or TIA. The acronym BEFAST is an easy way to remember the main warning signs of stroke. ? B = Balance problems. Signs include dizziness, sudden trouble walking, or loss of balance ? E = Eye problems. This includes trouble seeing or a sudden change in vision. ? F = Face changes. This includes sudden weakness or numbness of the face, or the face or eyelid drooping to one side. ? A = Arm weakness or numbness. This happens suddenly and usually on one side of the body. ? S = Speech problems. This includes trouble speaking or  trouble understanding. ? T = Time. Time to call 911 or seek emergency care. Do not wait to see if symptoms go away. Make note of the time your symptoms started.  Other signs of stroke may include: ? A sudden, severe headache with no known cause. ? Feeling sick to your stomach (nauseous) or throwing up (vomiting). ? Seizure. Call your local emergency services (911 in [place] Do notdrive yourself to the clinic or hospital. Summary  The carotid arteries are arteries on both sides of the neck.  If these arteries get smaller or get blocked, you are more likely to have a stroke or a warning stroke (transient ischemic attack).  Take over-the-counter and prescription medicines only as told by your doctor.  Keep all follow-up visits as told by your doctor. This is important. This information is not intended to replace advice given to you by your health care provider. Make sure you discuss any questions you have with your health care provider. Document Released: 05/05/2012 Document Revised: 05/14/2017 Document Reviewed: 05/14/2017 Elsevier Interactive Patient Education  2019 [employer]  

## 2018-06-01 NOTE — Assessment & Plan Note (Signed)
His carotid duplex today shows velocities in the upper end of the 40 to 59% range bilaterally.  This is a little lower than previously seen and there has clearly not been significant progression from his previous study. No role for surgery or intervention at this level.  Would recommend 36-month follow-up interval at this point with continued medical management.

## 2018-07-13 DIAGNOSIS — I6523 Occlusion and stenosis of bilateral carotid arteries: Secondary | ICD-10-CM | POA: Diagnosis not present

## 2018-07-13 DIAGNOSIS — E782 Mixed hyperlipidemia: Secondary | ICD-10-CM | POA: Diagnosis not present

## 2018-07-13 DIAGNOSIS — I25810 Atherosclerosis of coronary artery bypass graft(s) without angina pectoris: Secondary | ICD-10-CM | POA: Diagnosis not present

## 2018-07-13 DIAGNOSIS — I10 Essential (primary) hypertension: Secondary | ICD-10-CM | POA: Diagnosis not present

## 2018-07-22 DIAGNOSIS — H353132 Nonexudative age-related macular degeneration, bilateral, intermediate dry stage: Secondary | ICD-10-CM | POA: Diagnosis not present

## 2018-08-23 ENCOUNTER — Ambulatory Visit: Payer: PPO | Admitting: Family Medicine

## 2018-10-12 DIAGNOSIS — G959 Disease of spinal cord, unspecified: Secondary | ICD-10-CM | POA: Diagnosis not present

## 2018-10-12 DIAGNOSIS — M47812 Spondylosis without myelopathy or radiculopathy, cervical region: Secondary | ICD-10-CM | POA: Diagnosis not present

## 2018-10-12 DIAGNOSIS — Z981 Arthrodesis status: Secondary | ICD-10-CM | POA: Diagnosis not present

## 2018-11-06 ENCOUNTER — Other Ambulatory Visit: Payer: Self-pay | Admitting: Family Medicine

## 2018-11-22 ENCOUNTER — Telehealth: Payer: Self-pay | Admitting: Family Medicine

## 2018-11-22 NOTE — Chronic Care Management (AMB) (Signed)
Chronic Care Management   Note  11/22/2018 Name: ABDIKADIR FOHL MRN: [id number] DOB: [birth year]  Austin [name] is a 85 y.o. year old male who is a primary care patient of Jerrol Banana., MD. I reached out to Avis Epley by phone today in response to a referral sent by [name]'s health plan.    Mr. [name] was given information about Chronic Care Management services today including:  1. CCM service includes personalized support from designated clinical staff supervised by his physician, including individualized plan of care and coordination with other care providers 2. 24/7 contact phone numbers for assistance for urgent and routine care needs. 3. Service will only be billed when office clinical staff spend 20 minutes or more in a month to coordinate care. 4. Only one practitioner may furnish and bill the service in a calendar month. 5. The patient may stop CCM services at any time (effective at the end of the month) by phone call to the office staff. 6. The patient will be responsible for cost sharing (co-pay) of up to 20% of the service fee (after annual deductible is met).  Patient did not agree to enrollment in care management services and does not wish to consider at this time.  Follow up plan: The patient has been provided with contact information for the chronic care management team and has been advised to call with any health related questions or concerns.   Macy  [email or web address]   ??[phone]

## 2018-11-23 ENCOUNTER — Ambulatory Visit (INDEPENDENT_AMBULATORY_CARE_PROVIDER_SITE_OTHER): Payer: PPO | Admitting: Family Medicine

## 2018-11-23 ENCOUNTER — Encounter: Payer: Self-pay | Admitting: Family Medicine

## 2018-11-23 ENCOUNTER — Other Ambulatory Visit: Payer: Self-pay

## 2018-11-23 VITALS — BP 122/70 | HR 58 | Temp 98.6°F | Resp 16 | Ht 66.0 in | Wt 175.0 lb

## 2018-11-23 DIAGNOSIS — I25110 Atherosclerotic heart disease of native coronary artery with unstable angina pectoris: Secondary | ICD-10-CM | POA: Diagnosis not present

## 2018-11-23 DIAGNOSIS — M159 Polyosteoarthritis, unspecified: Secondary | ICD-10-CM

## 2018-11-23 DIAGNOSIS — S56522A Laceration of other extensor muscle, fascia and tendon at forearm level, left arm, initial encounter: Secondary | ICD-10-CM | POA: Diagnosis not present

## 2018-11-23 DIAGNOSIS — E785 Hyperlipidemia, unspecified: Secondary | ICD-10-CM

## 2018-11-23 DIAGNOSIS — G8929 Other chronic pain: Secondary | ICD-10-CM | POA: Diagnosis not present

## 2018-11-23 DIAGNOSIS — M25562 Pain in left knee: Secondary | ICD-10-CM | POA: Diagnosis not present

## 2018-11-23 DIAGNOSIS — I10 Essential (primary) hypertension: Secondary | ICD-10-CM

## 2018-11-23 DIAGNOSIS — D649 Anemia, unspecified: Secondary | ICD-10-CM

## 2018-11-23 NOTE — Progress Notes (Signed)
Patient: Austin [name] Male    DOB: Feb 23, 1929   85 y.o.   MRN: [id number] Visit Date: 11/23/2018  Today's Provider: Wilhemena Durie, MD   Chief Complaint  Patient presents with  . Hypertension  . Hyperlipidemia  . Arthritis   Subjective:   HPI  Hypertension, follow-up:  BP Readings from Last 3 Encounters:  11/23/18 122/70  06/01/18 (!) 155/64  03/11/18 115/63    He was last seen for hypertension 6 months ago.  BP at that visit was 155/64. Management since that visit includes no changes. He reports good compliance with treatment. He is not having side effects.  He is exercising. He is adherent to low salt diet.   Outside blood pressures are checked occasionally. He is experiencing none.  Patient denies exertional chest pressure/discomfort, lower extremity edema and palpitations.    Weight trend: stable Wt Readings from Last 3 Encounters:  11/23/18 175 lb (79.4 kg)  06/01/18 169 lb (76.7 kg)  03/10/18 166 lb 6 oz (75.5 kg)    Current diet: well balanced  Lipid/Cholesterol, Follow-up:   Last seen for this6 months ago.  Management changes since that visit include no changes. . Last Lipid Panel:    Component Value Date/Time   CHOL 123 12/02/2017 0905   TRIG 94 12/02/2017 0905   HDL 40 12/02/2017 0905   CHOLHDL 3.1 12/02/2017 0905   CHOLHDL 3.6 03/05/2017 1036   LDLCALC 64 12/02/2017 0905   LDLCALC 90 03/05/2017 1036    Risk factors for vascular disease include hypertension  He reports good compliance with treatment. He is not having side effects.  Current symptoms include none and have been stable. Weight trend: stable Prior visit with dietician: no Current diet: well balanced Current exercise: no regular exercise  Wt Readings from Last 3 Encounters:  11/23/18 175 lb (79.4 kg)  06/01/18 169 lb (76.7 kg)  03/10/18 166 lb 6 oz (75.5 kg)   He has stiffness and pain of left knee. He has a wound from earlier today from laceration of  left forearm.  Allergies  Allergen Reactions  . Penicillins Itching, Swelling, Rash and Other (See Comments)    Has patient had a PCN reaction causing immediate rash, facial/tongue/throat swelling, SOB or lightheadedness with hypotension: Yes Has patient had a PCN reaction causing severe rash involving mucus membranes or skin necrosis: No Has patient had a PCN reaction that required hospitalization No Has patient had a PCN reaction occurring within the last 10 years: No If all of the above answers are "NO", then may proceed with Cephalosporin use.      Current Outpatient Medications:  .  amLODipine (NORVASC) 5 MG tablet, Take 5 mg by mouth daily., Disp: , Rfl:  .  atorvastatin (LIPITOR) 20 MG tablet, Take 20 mg by mouth daily. , Disp: , Rfl:  .  Biotin 5000 MCG CAPS, Take 5,000 mcg by mouth daily. , Disp: , Rfl:  .  Cholecalciferol (VITAMIN D3) 2000 UNITS capsule, Take 2,000 Units by mouth 2 (two) times daily. , Disp: , Rfl:  .  Coenzyme Q10 (COQ-10) 200 MG CAPS, Take 200 mg by mouth daily., Disp: , Rfl:  .  dorzolamide-timolol (COSOPT) 22.3-6.8 MG/ML ophthalmic solution, Place 1 drop into both eyes 2 (two) times daily. , Disp: , Rfl:  .  Glucosamine HCl (GLUCOSAMINE PO), Take 1 tablet by mouth 2 (two) times daily., Disp: , Rfl:  .  isosorbide mononitrate (IMDUR) 60 MG 24 hr tablet, Take  60 mg by mouth daily. , Disp: , Rfl:  .  Liniments (SALONPAS PAIN RELIEF PATCH EX), Apply 1 patch topically daily as needed (for pain)., Disp: , Rfl:  .  Melatonin 5 MG CAPS, Take 5 mg by mouth at bedtime as needed (for sleep). , Disp: , Rfl:  .  Multiple Vitamins-Minerals (PRESERVISION AREDS 2) CAPS, Take 1 capsule by mouth 2 (two) times daily., Disp: , Rfl:  .  nitroGLYCERIN (NITROSTAT) 0.4 MG SL tablet, Place 0.4 mg under the tongue every 5 (five) minutes as needed for chest pain. , Disp: , Rfl:  .  Omega-3 Fatty Acids (FISH OIL) 1200 MG CAPS, Take 1,200 mg by mouth daily. , Disp: , Rfl:  .  omeprazole  (PRILOSEC) 20 MG capsule, Take 20 mg by mouth daily. , Disp: , Rfl:  .  Polyethyl Glycol-Propyl Glycol (SYSTANE OP), Place 1 drop into the right eye daily as needed (for blurriness). , Disp: , Rfl:  .  tamsulosin (FLOMAX) 0.4 MG CAPS capsule, TAKE 1 CAPSULE BY MOUTH EVERY DAY, Disp: 30 capsule, Rfl: 11 .  Travoprost, BAK Free, (TRAVATAN Z) 0.004 % SOLN ophthalmic solution, Place 1 drop into both eyes at bedtime. , Disp: , Rfl:  .  Turmeric 500 MG TABS, Take 1,000 mg by mouth daily., Disp: , Rfl:  .  valsartan-hydrochlorothiazide (DIOVAN HCT) 320-25 MG per tablet, Take 1 tablet by mouth daily. , Disp: , Rfl:  .  vitamin B-12 (CYANOCOBALAMIN) 1000 MCG tablet, Take 1,000 mcg by mouth daily., Disp: , Rfl:  .  methocarbamol (ROBAXIN) 500 MG tablet, Take 1 tablet (500 mg total) by mouth every 6 (six) hours as needed for muscle spasms. (Patient not taking: Reported on 06/01/2018), Disp: 90 tablet, Rfl: 0 .  ranitidine (ZANTAC) 150 MG capsule, Take 1 capsule (150 mg total) by mouth every evening. (Patient not taking: Reported on 02/22/2018), Disp: 30 capsule, Rfl: 5  Review of Systems  Constitutional: Negative for activity change, appetite change, diaphoresis, fatigue, fever and unexpected weight change.  Respiratory: Negative for cough and shortness of breath.   Cardiovascular: Negative for chest pain, palpitations and leg swelling.  Gastrointestinal: Negative.   Endocrine: Negative for cold intolerance, heat intolerance, polydipsia, polyphagia and polyuria.  Musculoskeletal: Positive for arthralgias. Negative for myalgias.  Allergic/Immunologic: Negative for environmental allergies.  Neurological: Negative for dizziness, light-headedness and headaches.  Psychiatric/Behavioral: Negative for agitation, self-injury, sleep disturbance and suicidal ideas. The patient is not nervous/anxious.     Social History   Tobacco Use  . Smoking status: Former Smoker    Years: 15.00    Types: Cigarettes    Quit  date: 1990    Years since quitting: 30.4  . Smokeless tobacco: Never Used  Substance Use Topics  . Alcohol use: Yes    Comment: 1-2 glasses of wine a night      Objective:   BP 122/70 (BP Location: Left Arm, Patient Position: Sitting, Cuff Size: Normal)   Pulse (!) 58   Temp 98.6 F (37 C)   Resp 16   Ht 5\' 6"  (1.676 m)   Wt 175 lb (79.4 kg)   SpO2 99%   BMI 28.25 kg/m  Vitals:   11/23/18 0829  BP: 122/70  Pulse: (!) 58  Resp: 16  Temp: 98.6 F (37 C)  SpO2: 99%  Weight: 175 lb (79.4 kg)  Height: 5\' 6"  (1.676 m)     Physical Exam Vitals signs reviewed.  Constitutional:      Appearance: He  is well-developed.  HENT:     Head: Normocephalic and atraumatic.     Right Ear: External ear normal.     Left Ear: External ear normal.     Nose: Nose normal.  Eyes:     General: No scleral icterus. Neck:     Thyroid: No thyromegaly.  Cardiovascular:     Rate and Rhythm: Normal rate and regular rhythm.     Heart sounds: Normal heart sounds.  Pulmonary:     Effort: Pulmonary effort is normal.     Breath sounds: Normal breath sounds.  Abdominal:     Palpations: Abdomen is soft.  Musculoskeletal:        General: Tenderness present.     Comments: Tender along medial and lateral joint line of left knee.  Skin:    General: Skin is warm and dry.     Comments: 5 cm left forearm laceration.Noninfected. No need for closure.  Neurological:     General: No focal deficit present.     Mental Status: He is alert and oriented to person, place, and time. Mental status is at baseline.  Psychiatric:        Mood and Affect: Mood normal.        Behavior: Behavior normal.        Thought Content: Thought content normal.        Judgment: Judgment normal.      No results found for any visits on 11/23/18.     Assessment & Plan    1. Benign essential HTN Controlled on norvasc. - Comprehensive metabolic panel  2. Coronary artery disease involving native coronary artery of native  heart with unstable angina pectoris ([place]) All risk factors treated. - TSH  3. Chronic pain of left knee  - Ambulatory referral to Physical Therapy  4. Osteoarthritis of multiple joints, unspecified osteoarthritis type  - Ambulatory referral to Physical Therapy  5. Hyperlipidemia, unspecified hyperlipidemia type On lipitor. - Lipid panel  6. Anemia, unspecified type  - CBC with Differential/Platelet 7.laceration of left forearm Update tetanus.    Genessis Flanary Cranford Mon, MD  [facility]

## 2018-11-24 LAB — LIPID PANEL
Chol/HDL Ratio: 2.7 ratio (ref 0.0–5.0)
Cholesterol, Total: 134 mg/dL (ref 100–199)
HDL: 49 mg/dL (ref >39)
LDL Calculated: 69 mg/dL (ref 0–99)
Triglycerides: 78 mg/dL (ref 0–149)
VLDL Cholesterol Cal: 16 mg/dL (ref 5–40)

## 2018-11-24 LAB — COMPREHENSIVE METABOLIC PANEL
ALT: 20 IU/L (ref 0–44)
AST: 22 IU/L (ref 0–40)
Albumin/Globulin Ratio: 2 (ref 1.2–2.2)
Albumin: 4.5 g/dL (ref 3.6–4.6)
Alkaline Phosphatase: 78 IU/L (ref 39–117)
BUN/Creatinine Ratio: 15 (ref 10–24)
BUN: 25 mg/dL (ref 8–27)
Bilirubin Total: 0.5 mg/dL (ref 0.0–1.2)
CO2: 19 mmol/L — ABNORMAL LOW (ref 20–29)
Calcium: 9.4 mg/dL (ref 8.6–10.2)
Chloride: 101 mmol/L (ref 96–106)
Creatinine, Ser: 1.64 mg/dL — ABNORMAL HIGH (ref 0.76–1.27)
GFR calc Af Amer: 43 mL/min/{1.73_m2} — ABNORMAL LOW (ref >59)
GFR calc non Af Amer: 37 mL/min/{1.73_m2} — ABNORMAL LOW (ref >59)
Globulin, Total: 2.3 g/dL (ref 1.5–4.5)
Glucose: 112 mg/dL — ABNORMAL HIGH (ref 65–99)
Potassium: 4.5 mmol/L (ref 3.5–5.2)
Sodium: 137 mmol/L (ref 134–144)
Total Protein: 6.8 g/dL (ref 6.0–8.5)

## 2018-11-24 LAB — CBC WITH DIFFERENTIAL/PLATELET
Basophils Absolute: 0.1 10*3/uL (ref 0.0–0.2)
Basos: 1 %
EOS (ABSOLUTE): 0.2 10*3/uL (ref 0.0–0.4)
Eos: 3 %
Hematocrit: 37.1 % — ABNORMAL LOW (ref 37.5–51.0)
Hemoglobin: 12.5 g/dL — ABNORMAL LOW (ref 13.0–17.7)
Immature Grans (Abs): 0 10*3/uL (ref 0.0–0.1)
Immature Granulocytes: 1 %
Lymphocytes Absolute: 1.4 10*3/uL (ref 0.7–3.1)
Lymphs: 25 %
MCH: 30.2 pg (ref 26.6–33.0)
MCHC: 33.7 g/dL (ref 31.5–35.7)
MCV: 90 fL (ref 79–97)
Monocytes Absolute: 0.5 10*3/uL (ref 0.1–0.9)
Monocytes: 9 %
Neutrophils Absolute: 3.5 10*3/uL (ref 1.4–7.0)
Neutrophils: 61 %
Platelets: 176 10*3/uL (ref 150–450)
RBC: 4.14 x10E6/uL (ref 4.14–5.80)
RDW: 12.1 % (ref 11.6–15.4)
WBC: 5.7 10*3/uL (ref 3.4–10.8)

## 2018-11-24 LAB — TSH: TSH: 1.59 u[IU]/mL (ref 0.450–4.500)

## 2019-01-03 ENCOUNTER — Other Ambulatory Visit: Payer: Self-pay

## 2019-01-03 ENCOUNTER — Ambulatory Visit (INDEPENDENT_AMBULATORY_CARE_PROVIDER_SITE_OTHER): Payer: PPO | Admitting: Nurse Practitioner

## 2019-01-03 ENCOUNTER — Encounter (INDEPENDENT_AMBULATORY_CARE_PROVIDER_SITE_OTHER): Payer: Self-pay | Admitting: Nurse Practitioner

## 2019-01-03 ENCOUNTER — Ambulatory Visit (INDEPENDENT_AMBULATORY_CARE_PROVIDER_SITE_OTHER): Payer: PPO

## 2019-01-03 VITALS — BP 123/57 | HR 60 | Resp 12 | Ht 66.0 in | Wt 172.0 lb

## 2019-01-03 DIAGNOSIS — I10 Essential (primary) hypertension: Secondary | ICD-10-CM

## 2019-01-03 DIAGNOSIS — K219 Gastro-esophageal reflux disease without esophagitis: Secondary | ICD-10-CM

## 2019-01-03 DIAGNOSIS — I6523 Occlusion and stenosis of bilateral carotid arteries: Secondary | ICD-10-CM

## 2019-01-03 NOTE — Progress Notes (Signed)
SUBJECTIVE:  Patient ID: Austin [name], male    DOB: 1928/06/29, 85 y.o.   MRN: [id number] Chief Complaint  Patient presents with  . Follow-up    HPI  Austin [name] is a 85 y.o. male The patient is seen for follow up evaluation of carotid stenosis. The carotid stenosis followed by ultrasound.   The patient denies amaurosis fugax. There is no recent history of TIA symptoms or focal motor deficits. There is no prior documented CVA.  The patient is taking enteric-coated aspirin 81 mg daily.  There is no history of migraine headaches. There is no history of seizures.  The patient has a history of coronary artery disease, no recent episodes of angina or shortness of breath. The patient denies PAD or claudication symptoms. There is a history of hyperlipidemia which is being treated with a statin.    Carotid Duplex done today shows 40-59% bilaterally.  No change compared to last study in 06/01/2018.  Past Medical History:  Diagnosis Date  . Absolute anemia 12/08/2014  . Allergy   . Anginal pain ([place])    much less angina d/t lack of activity  . Arthritis    all over body  . Bradycardia 05/03/2015  . Calculus of kidney 12/08/2014  . Coronary artery disease   . GERD (gastroesophageal reflux disease)   . Glaucoma   . History of kidney stones   . HLD (hyperlipidemia)   . Hyperglycemia 08/27/2015  . Hypertension   . Myocardial infarction ([place]) 2011  . Peripheral vascular disease ([place])   . VHD (valvular heart disease)     Past Surgical History:  Procedure Laterality Date  . ANTERIOR CERVICAL DECOMP/DISCECTOMY FUSION N/A 12/30/2017   Procedure: ANTERIOR CERVICAL DECOMPRESSION/DISCECTOMY FUSION 3 LEVELS- C3-6;  Surgeon: Meade Maw, MD;  Location: [facility] OR[facility];  Service: Neurosurgery;  Laterality: N/A;  . CARDIAC CATHETERIZATION  2007  . CARDIAC CATHETERIZATION N/A 04/17/2015   Procedure: Left Heart Cath and Coronary Angiography;  Surgeon: Corey Skains, MD;  Location: [facility];  Service: Cardiovascular;  Laterality: N/A;  . CARPAL TUNNEL RELEASE Bilateral    right 06/09/08 left 05/03/09  . CATARACT EXTRACTION W/ INTRAOCULAR LENS  IMPLANT, BILATERAL    . COLONOSCOPY N/A 10/02/2014   Procedure: COLONOSCOPY;  Surgeon: Manya Silvas, MD;  Location: [facility];  Service: Endoscopy;  Laterality: N/A;  . CORONARY ARTERY BYPASS GRAFT  2007  . ESOPHAGOGASTRODUODENOSCOPY    . EYE SURGERY    . HEMORROIDECTOMY    . JOINT REPLACEMENT Right 03/2017   partial knee replacement  . KNEE ARTHROSCOPY Right 08/05/2016   Procedure: ARTHROSCOPY KNEE;  Surgeon: Thornton Park, MD;  Location: [facility] OR[facility];  Service: Orthopedics;  Laterality: Right;  . KNEE ARTHROSCOPY WITH LATERAL RELEASE Right 08/05/2016   Procedure: KNEE ARTHROSCOPY WITH LATERAL RELEASE;  Surgeon: Thornton Park, MD;  Location: [facility];  Service: Orthopedics;  Laterality: Right;  . KNEE ARTHROSCOPY WITH MEDIAL MENISECTOMY Right 08/05/2016   Procedure: KNEE ARTHROSCOPY WITH MEDIAL MENISECTOMY;  Surgeon: Thornton Park, MD;  Location: [facility];  Service: Orthopedics;  Laterality: Right;  Partial  . LUMBAR EPIDURAL INJECTION  2011  . LUMBAR LAMINECTOMY/DECOMPRESSION MICRODISCECTOMY N/A 03/10/2018   Procedure: LUMBAR LAMINECTOMY/DECOMPRESSION MICRODISCECTOMY 3 LEVELS-L2-5;  Surgeon: Meade Maw, MD;  Location: [facility] ORS;  Service: Neurosurgery;  Laterality: N/A;  . MOUTH SURGERY     tooth implant  . SHOULDER SURGERY Right 2011   rotator cuff repair  . SYNOVECTOMY Right 08/05/2016   Procedure: SYNOVECTOMY;  Surgeon: Thornton Park, MD;  Location: [facility] ORS;  Service: Orthopedics;  Laterality: Right;  arthroscopic extensive synovectomy  . TONSILLECTOMY      Social History   Socioeconomic History  . Marital status: Married    Spouse name: Blanch Media  . Number of children: 1  . Years of education: Not on file  . Highest education level: Not on file  Occupational History  . Occupation: Hydrologist: RETIRED  Social Needs  . Financial resource strain: Not on file  . Food insecurity    Worry: Not on file    Inability: Not on file  . Transportation needs    Medical: Not on file    Non-medical: Not on file  Tobacco Use  . Smoking status: Former Smoker    Years: 15.00    Types: Cigarettes    Quit date: 1990    Years since quitting: 30.6  . Smokeless tobacco: Never Used  Substance and Sexual Activity  . Alcohol use: Yes    Comment: 1-2 glasses of wine a night  . Drug use: No  . Sexual activity: Not Currently  Lifestyle  . Physical activity    Days per week: Not on file    Minutes per session: Not on file  . Stress: Not on file  Relationships  . Social Herbalist on phone: Not on file    Gets together: Not on file    Attends religious service: Not on file    Active member of club or organization: Not on file    Attends meetings of clubs or organizations: Not on file    Relationship status: Not on file  . Intimate partner violence    Fear of current or ex partner: Not on file    Emotionally abused: Not on file    Physically abused: Not on file    Forced sexual activity: Not on file  Other Topics Concern  . Not on file  Social History Narrative  . Not on file    Family History  Problem Relation Age of Onset  . Heart disease Mother   . Heart disease Father   . Heart disease Brother   . Heart disease Brother   . Heart disease Brother   . Heart disease Brother   . Heart disease Brother     Allergies  Allergen Reactions  . Penicillins Itching, Swelling, Rash and Other (See Comments)    Has patient had a PCN reaction causing immediate rash, facial/tongue/throat swelling, SOB or lightheadedness with hypotension: Yes Has patient had a PCN reaction causing severe rash involving mucus membranes or skin necrosis: No Has patient had a PCN reaction that required hospitalization No Has patient had a PCN reaction occurring within the  last 10 years: No If all of the above answers are "NO", then may proceed with Cephalosporin use.      Review of Systems   Review of Systems: Negative Unless Checked Constitutional: [] Weight loss  [] Fever  [] Chills Cardiac: [] Chest pain   []  Atrial Fibrillation  [] Palpitations   [] Shortness of breath when laying flat   [x] Shortness of breath with exertion. [] Shortness of breath at rest Vascular:  [] Pain in legs with walking   [] Pain in legs with standing [] Pain in legs when laying flat   [x] Claudication    [] Pain in feet when laying flat    [] History of DVT   [] Phlebitis   [] Swelling in legs   [] Varicose veins   []   Non-healing ulcers Pulmonary:   [] Uses home oxygen   [] Productive cough   [] Hemoptysis   [] Wheeze  [] COPD   [] Asthma Neurologic:  [] Dizziness   [] Seizures  [] Blackouts [] History of stroke   [] History of TIA  [] Aphasia   [] Temporary Blindness   [] Weakness or numbness in arm   [] Weakness or numbness in leg Musculoskeletal:   [] Joint swelling   [] Joint pain   [] Low back pain  []  History of Knee Replacement [x] Arthritis [] back Surgeries  []  Spinal Stenosis    Hematologic:  [] Easy bruising  [] Easy bleeding   [] Hypercoagulable state   [] Anemic Gastrointestinal:  [] Diarrhea   [] Vomiting  [] Gastroesophageal reflux/heartburn   [] Difficulty swallowing. [] Abdominal pain Genitourinary:  [] Chronic kidney disease   [] Difficult urination  [] Anuric   [] Blood in urine [] Frequent urination  [] Burning with urination   [] Hematuria Skin:  [] Rashes   [] Ulcers [] Wounds Psychological:  [] History of anxiety   []  History of major depression  []  Memory Difficulties      OBJECTIVE:   Physical Exam  BP (!) 123/57 (BP Location: Left Arm, Patient Position: Sitting, Cuff Size: Normal)   Pulse 60   Resp 12   Ht 5\' 6"  (1.676 m)   Wt 172 lb (78 kg)   BMI 27.76 kg/m   Gen: WD/WN, NAD Head: [place]/AT, No temporalis wasting.  Ear/Nose/Throat: Hearing grossly intact, nares w/o erythema or drainage Eyes: PER,  EOMI, sclera nonicteric.  Neck: Supple, no masses.  No JVD.  Pulmonary:  Good air movement, no use of accessory muscles.  Cardiac: RRR Vascular:  Vessel Right Left  Radial Palpable Palpable   Gastrointestinal: soft, non-distended. No guarding/no peritoneal signs.  Musculoskeletal: M/S 5/5 throughout.  No deformity or atrophy.  Neurologic: Pain and light touch intact in extremities.  Symmetrical.  Speech is fluent. Motor exam as listed above. Psychiatric: Judgment intact, Mood & affect appropriate for pt's clinical situation. Dermatologic: No Venous rashes. No Ulcers Noted.  No changes consistent with cellulitis. Lymph : No Cervical lymphadenopathy, no lichenification or skin changes of chronic lymphedema.       ASSESSMENT AND PLAN:  1. Benign essential HTN Continue antihypertensive medications as already ordered, these medications have been reviewed and there are no changes at this time.   2. Bilateral carotid artery stenosis Recommend:  Given the patient's asymptomatic subcritical stenosis no further invasive testing or surgery at this time.  Duplex ultrasound shows 40-59% stenosis bilaterally.  Continue antiplatelet therapy as prescribed Continue management of CAD, HTN and Hyperlipidemia Healthy heart diet,  encouraged exercise at least 4 times per week Follow up in 12 months with duplex ultrasound and physical exam  - VAS US CAROTID; Future  3. Gastroesophageal reflux disease, esophagitis presence not specified Continue PPI as already ordered, this medication has been reviewed and there are no changes at this time.  Avoidence of caffeine and alcohol  Moderate elevation of the head of the bed    Current Outpatient Medications on File Prior to Visit  Medication Sig Dispense Refill  . amLODipine (NORVASC) 5 MG tablet Take 5 mg by mouth daily.    Marland Kitchen atorvastatin (LIPITOR) 20 MG tablet Take 20 mg by mouth daily.     . Biotin 5000 MCG CAPS Take 5,000 mcg by mouth daily.      . Cholecalciferol (VITAMIN D3) 2000 UNITS capsule Take 2,000 Units by mouth 2 (two) times daily.     . Coenzyme Q10 (COQ-10) 200 MG CAPS Take 200 mg by mouth daily.    . dorzolamide-timolol (  COSOPT) 22.3-6.8 MG/ML ophthalmic solution Place 1 drop into both eyes 2 (two) times daily.     . isosorbide mononitrate (IMDUR) 60 MG 24 hr tablet Take 60 mg by mouth daily.     . Melatonin 5 MG CAPS Take 5 mg by mouth at bedtime as needed (for sleep).     . nitroGLYCERIN (NITROSTAT) 0.4 MG SL tablet Place 0.4 mg under the tongue every 5 (five) minutes as needed for chest pain.     . Omega-3 Fatty Acids (FISH OIL) 1200 MG CAPS Take 1,200 mg by mouth daily.     Marland Kitchen omeprazole (PRILOSEC) 20 MG capsule Take 20 mg by mouth daily.     Vladimir Faster Glycol-Propyl Glycol (SYSTANE OP) Place 1 drop into the right eye daily as needed (for blurriness).     . tamsulosin (FLOMAX) 0.4 MG CAPS capsule TAKE 1 CAPSULE BY MOUTH EVERY DAY 30 capsule 11  . Travoprost, BAK Free, (TRAVATAN Z) 0.004 % SOLN ophthalmic solution Place 1 drop into both eyes at bedtime.     . Turmeric 500 MG TABS Take 1,000 mg by mouth daily.    . valsartan-hydrochlorothiazide (DIOVAN HCT) 320-25 MG per tablet Take 1 tablet by mouth daily.     . vitamin B-12 (CYANOCOBALAMIN) 1000 MCG tablet Take 1,000 mcg by mouth daily.    . Glucosamine HCl (GLUCOSAMINE PO) Take 1 tablet by mouth 2 (two) times daily.    . Liniments (SALONPAS PAIN RELIEF PATCH EX) Apply 1 patch topically daily as needed (for pain).    . methocarbamol (ROBAXIN) 500 MG tablet Take 1 tablet (500 mg total) by mouth every 6 (six) hours as needed for muscle spasms. (Patient not taking: Reported on 01/03/2019) 90 tablet 0  . Multiple Vitamins-Minerals (PRESERVISION AREDS 2) CAPS Take 1 capsule by mouth 2 (two) times daily.    . ranitidine (ZANTAC) 150 MG capsule Take 1 capsule (150 mg total) by mouth every evening. (Patient not taking: Reported on 02/22/2018) 30 capsule 5   No current  facility-administered medications on file prior to visit.     There are no Patient Instructions on file for this visit. No follow-ups on file.   Kris Hartmann, NP  This note was completed with Sales executive.  Any errors are purely unintentional.

## 2019-01-11 DIAGNOSIS — M1712 Unilateral primary osteoarthritis, left knee: Secondary | ICD-10-CM | POA: Diagnosis not present

## 2019-01-13 DIAGNOSIS — I38 Endocarditis, valve unspecified: Secondary | ICD-10-CM | POA: Diagnosis not present

## 2019-01-13 DIAGNOSIS — I739 Peripheral vascular disease, unspecified: Secondary | ICD-10-CM | POA: Diagnosis not present

## 2019-01-13 DIAGNOSIS — I6523 Occlusion and stenosis of bilateral carotid arteries: Secondary | ICD-10-CM | POA: Diagnosis not present

## 2019-01-13 DIAGNOSIS — I340 Nonrheumatic mitral (valve) insufficiency: Secondary | ICD-10-CM | POA: Diagnosis not present

## 2019-01-13 DIAGNOSIS — I10 Essential (primary) hypertension: Secondary | ICD-10-CM | POA: Diagnosis not present

## 2019-01-13 DIAGNOSIS — I25810 Atherosclerosis of coronary artery bypass graft(s) without angina pectoris: Secondary | ICD-10-CM | POA: Diagnosis not present

## 2019-01-18 DIAGNOSIS — M25562 Pain in left knee: Secondary | ICD-10-CM | POA: Diagnosis not present

## 2019-01-18 DIAGNOSIS — M25662 Stiffness of left knee, not elsewhere classified: Secondary | ICD-10-CM | POA: Diagnosis not present

## 2019-01-19 DIAGNOSIS — H40003 Preglaucoma, unspecified, bilateral: Secondary | ICD-10-CM | POA: Diagnosis not present

## 2019-01-21 DIAGNOSIS — M25562 Pain in left knee: Secondary | ICD-10-CM | POA: Diagnosis not present

## 2019-01-21 DIAGNOSIS — M25662 Stiffness of left knee, not elsewhere classified: Secondary | ICD-10-CM | POA: Diagnosis not present

## 2019-01-25 DIAGNOSIS — H40003 Preglaucoma, unspecified, bilateral: Secondary | ICD-10-CM | POA: Diagnosis not present

## 2019-01-26 DIAGNOSIS — M25662 Stiffness of left knee, not elsewhere classified: Secondary | ICD-10-CM | POA: Diagnosis not present

## 2019-01-26 DIAGNOSIS — M25562 Pain in left knee: Secondary | ICD-10-CM | POA: Diagnosis not present

## 2019-02-02 DIAGNOSIS — M25662 Stiffness of left knee, not elsewhere classified: Secondary | ICD-10-CM | POA: Diagnosis not present

## 2019-02-02 DIAGNOSIS — M25562 Pain in left knee: Secondary | ICD-10-CM | POA: Diagnosis not present

## 2019-02-08 ENCOUNTER — Ambulatory Visit: Payer: Self-pay

## 2019-02-09 ENCOUNTER — Ambulatory Visit (INDEPENDENT_AMBULATORY_CARE_PROVIDER_SITE_OTHER): Payer: PPO

## 2019-02-09 ENCOUNTER — Other Ambulatory Visit: Payer: Self-pay

## 2019-02-09 DIAGNOSIS — L578 Other skin changes due to chronic exposure to nonionizing radiation: Secondary | ICD-10-CM | POA: Diagnosis not present

## 2019-02-09 DIAGNOSIS — Z872 Personal history of diseases of the skin and subcutaneous tissue: Secondary | ICD-10-CM | POA: Diagnosis not present

## 2019-02-09 DIAGNOSIS — L570 Actinic keratosis: Secondary | ICD-10-CM | POA: Diagnosis not present

## 2019-02-09 DIAGNOSIS — Z23 Encounter for immunization: Secondary | ICD-10-CM

## 2019-02-09 DIAGNOSIS — Z86018 Personal history of other benign neoplasm: Secondary | ICD-10-CM | POA: Diagnosis not present

## 2019-03-24 NOTE — Progress Notes (Signed)
Patient: Austin [name] Male    DOB: 1929-02-17   85 y.o.   MRN: [id number] Visit Date: 03/28/2019  Today's Provider: Wilhemena Durie, MD   Chief Complaint  Patient presents with  . Hypertension  . Coronary Artery Disease   Subjective:     HPI   Benign essential HTN From 11/23/2018-Controlled on norvasc. Labs checked, stable.  BP Readings from Last 3 Encounters:  03/28/19 132/60  01/03/19 (!) 123/57  11/23/18 122/70    Coronary artery disease involving native coronary artery of native heart with unstable angina pectoris ([place]) From 11/23/2018-All risk factors treated. Labs checked, stable.  Chronic pain of left knee From 11/23/2018-Ambulatory referral to Physical Therapy.  Osteoarthritis of multiple joints, unspecified osteoarthritis type From 11/23/2018-Ambulatory referral to Physical Therapy.  Hyperlipidemia, unspecified hyperlipidemia type From 11/23/2018-On lipitor. Labs checked, stable.  Anemia, unspecified type From 11/23/2018- Labs checked, stable.    Allergies  Allergen Reactions  . Penicillins Itching, Swelling, Rash and Other (See Comments)    Has patient had a PCN reaction causing immediate rash, facial/tongue/throat swelling, SOB or lightheadedness with hypotension: Yes Has patient had a PCN reaction causing severe rash involving mucus membranes or skin necrosis: No Has patient had a PCN reaction that required hospitalization No Has patient had a PCN reaction occurring within the last 10 years: No If all of the above answers are "NO", then may proceed with Cephalosporin use.      Current Outpatient Medications:  .  amLODipine (NORVASC) 5 MG tablet, Take 5 mg by mouth daily., Disp: , Rfl:  .  atorvastatin (LIPITOR) 20 MG tablet, Take 20 mg by mouth daily. , Disp: , Rfl:  .  Biotin 5000 MCG CAPS, Take 5,000 mcg by mouth daily. , Disp: , Rfl:  .  Cholecalciferol (VITAMIN D3) 2000 UNITS capsule, Take 2,000 Units by mouth 2 (two) times daily.  , Disp: , Rfl:  .  Coenzyme Q10 (COQ-10) 200 MG CAPS, Take 200 mg by mouth daily., Disp: , Rfl:  .  dorzolamide-timolol (COSOPT) 22.3-6.8 MG/ML ophthalmic solution, Place 1 drop into both eyes 2 (two) times daily. , Disp: , Rfl:  .  Glucosamine HCl (GLUCOSAMINE PO), Take 1 tablet by mouth 2 (two) times daily., Disp: , Rfl:  .  isosorbide mononitrate (IMDUR) 60 MG 24 hr tablet, Take 60 mg by mouth daily. , Disp: , Rfl:  .  Liniments (SALONPAS PAIN RELIEF PATCH EX), Apply 1 patch topically daily as needed (for pain)., Disp: , Rfl:  .  Melatonin 5 MG CAPS, Take 5 mg by mouth at bedtime as needed (for sleep). , Disp: , Rfl:  .  Multiple Vitamins-Minerals (PRESERVISION AREDS 2) CAPS, Take 1 capsule by mouth 2 (two) times daily., Disp: , Rfl:  .  nitroGLYCERIN (NITROSTAT) 0.4 MG SL tablet, Place 0.4 mg under the tongue every 5 (five) minutes as needed for chest pain. , Disp: , Rfl:  .  Omega-3 Fatty Acids (FISH OIL) 1200 MG CAPS, Take 1,200 mg by mouth daily. , Disp: , Rfl:  .  omeprazole (PRILOSEC) 20 MG capsule, Take 20 mg by mouth daily. , Disp: , Rfl:  .  Polyethyl Glycol-Propyl Glycol (SYSTANE OP), Place 1 drop into the right eye daily as needed (for blurriness). , Disp: , Rfl:  .  tamsulosin (FLOMAX) 0.4 MG CAPS capsule, TAKE 1 CAPSULE BY MOUTH EVERY DAY, Disp: 30 capsule, Rfl: 11 .  Travoprost, BAK Free, (TRAVATAN Z) 0.004 % SOLN ophthalmic solution,  Place 1 drop into both eyes at bedtime. , Disp: , Rfl:  .  Turmeric 500 MG TABS, Take 1,000 mg by mouth daily., Disp: , Rfl:  .  valsartan-hydrochlorothiazide (DIOVAN HCT) 320-25 MG per tablet, Take 1 tablet by mouth daily. , Disp: , Rfl:  .  vitamin B-12 (CYANOCOBALAMIN) 1000 MCG tablet, Take 1,000 mcg by mouth daily., Disp: , Rfl:  .  methocarbamol (ROBAXIN) 500 MG tablet, Take 1 tablet (500 mg total) by mouth every 6 (six) hours as needed for muscle spasms. (Patient not taking: Reported on 01/03/2019), Disp: 90 tablet, Rfl: 0 .  ranitidine (ZANTAC)  150 MG capsule, Take 1 capsule (150 mg total) by mouth every evening. (Patient not taking: Reported on 02/22/2018), Disp: 30 capsule, Rfl: 5  Review of Systems  Constitutional: Negative for activity change, appetite change, chills, diaphoresis, fatigue, fever and unexpected weight change.  HENT: Negative.   Eyes: Negative.   Respiratory: Negative for chest tightness, shortness of breath and wheezing.   Cardiovascular: Negative for chest pain, palpitations and leg swelling.  Gastrointestinal: Negative.  Negative for abdominal pain, nausea and vomiting.  Endocrine: Negative.   Musculoskeletal: Positive for arthralgias and myalgias.  Allergic/Immunologic: Negative.   Hematological: Negative.   Psychiatric/Behavioral: Negative.     Social History   Tobacco Use  . Smoking status: Former Smoker    Years: 15.00    Types: Cigarettes    Quit date: 1990    Years since quitting: 30.8  . Smokeless tobacco: Never Used  Substance Use Topics  . Alcohol use: Yes    Comment: 1-2 glasses of wine a night      Objective:   BP 132/60   Pulse (!) 52   Temp 97.6 F (36.4 C)   Wt 170 lb (77.1 kg)   SpO2 99%   BMI 27.44 kg/m  Vitals:   03/28/19 0934  BP: 132/60  Pulse: (!) 52  Temp: 97.6 F (36.4 C)  SpO2: 99%  Weight: 170 lb (77.1 kg)  Body mass index is 27.44 kg/m.   Physical Exam Vitals signs reviewed.  Constitutional:      Appearance: He is well-developed.  HENT:     Head: Normocephalic and atraumatic.     Right Ear: External ear normal.     Left Ear: External ear normal.     Nose: Nose normal.  Eyes:     General: No scleral icterus. Neck:     Thyroid: No thyromegaly.  Cardiovascular:     Rate and Rhythm: Normal rate and regular rhythm.     Heart sounds: Normal heart sounds.  Pulmonary:     Effort: Pulmonary effort is normal.     Breath sounds: Normal breath sounds.  Abdominal:     Palpations: Abdomen is soft.  Skin:    General: Skin is warm and dry.   Neurological:     Mental Status: He is alert and oriented to person, place, and time.  Psychiatric:        Behavior: Behavior normal.        Thought Content: Thought content normal.        Judgment: Judgment normal.      No results found for any visits on 03/28/19.     Assessment & Plan    1. Coronary artery disease involving native coronary artery of native heart with unstable angina pectoris ([place]) All risk factors treated.  2. Benign essential HTN Blood pressures at home running 130 range over 60s  3. Osteoarthritis of  knee (Right) Status post partial replacement  4. Lumbar foraminal stenosis (Right L2-3; Bilateral L3-4, L4-5) Presently stable.  5. Borderline diabetes Labs and/or A1c 6.  Borderline anemia    Wilhemena Durie, MD  [facility]

## 2019-03-27 IMAGING — CR DG CERVICAL SPINE 2 OR 3 VIEWS
1 series · 3 of 3 positions shown · non-contrast
Comparison: Fluoroscopic images December 30, 2017.

CLINICAL DATA: Status post cervical spine fusion.

EXAM:
CERVICAL SPINE - 2-3 VIEW

[Series 1: dg cervical spine 2 or 3 views · 0.14mm/px · 3 of 3 slices shown]
[im 1/3]
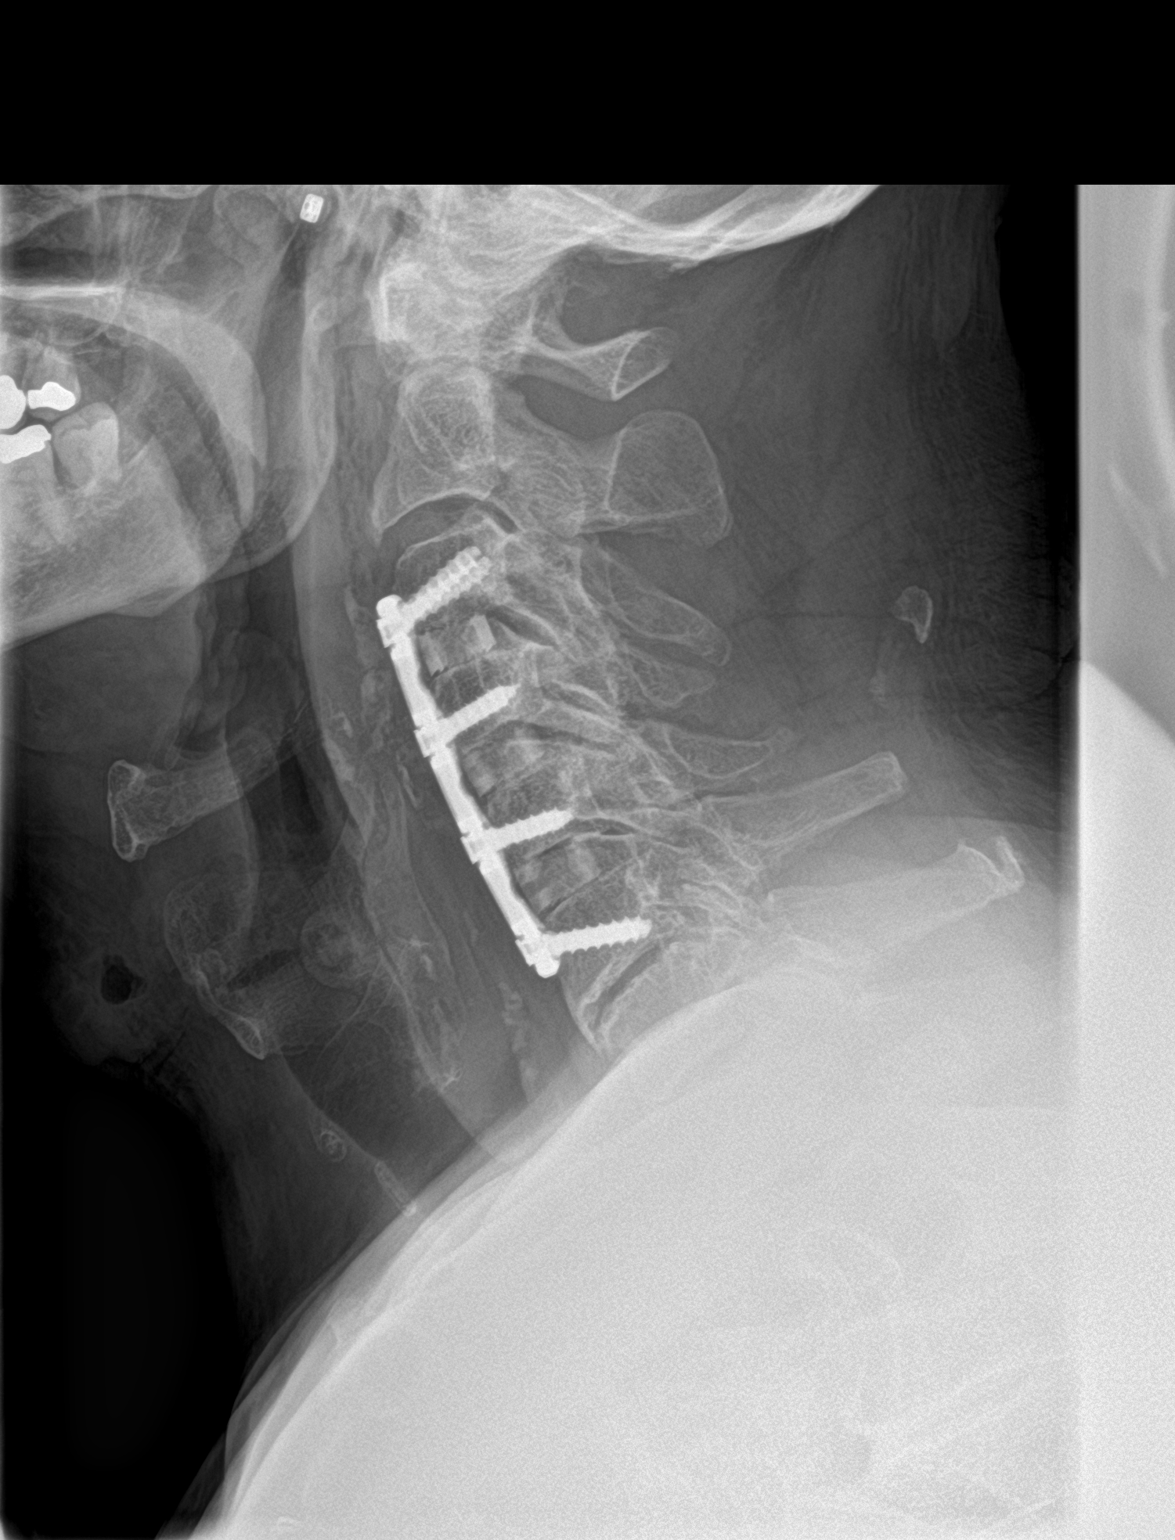
[im 2/3]
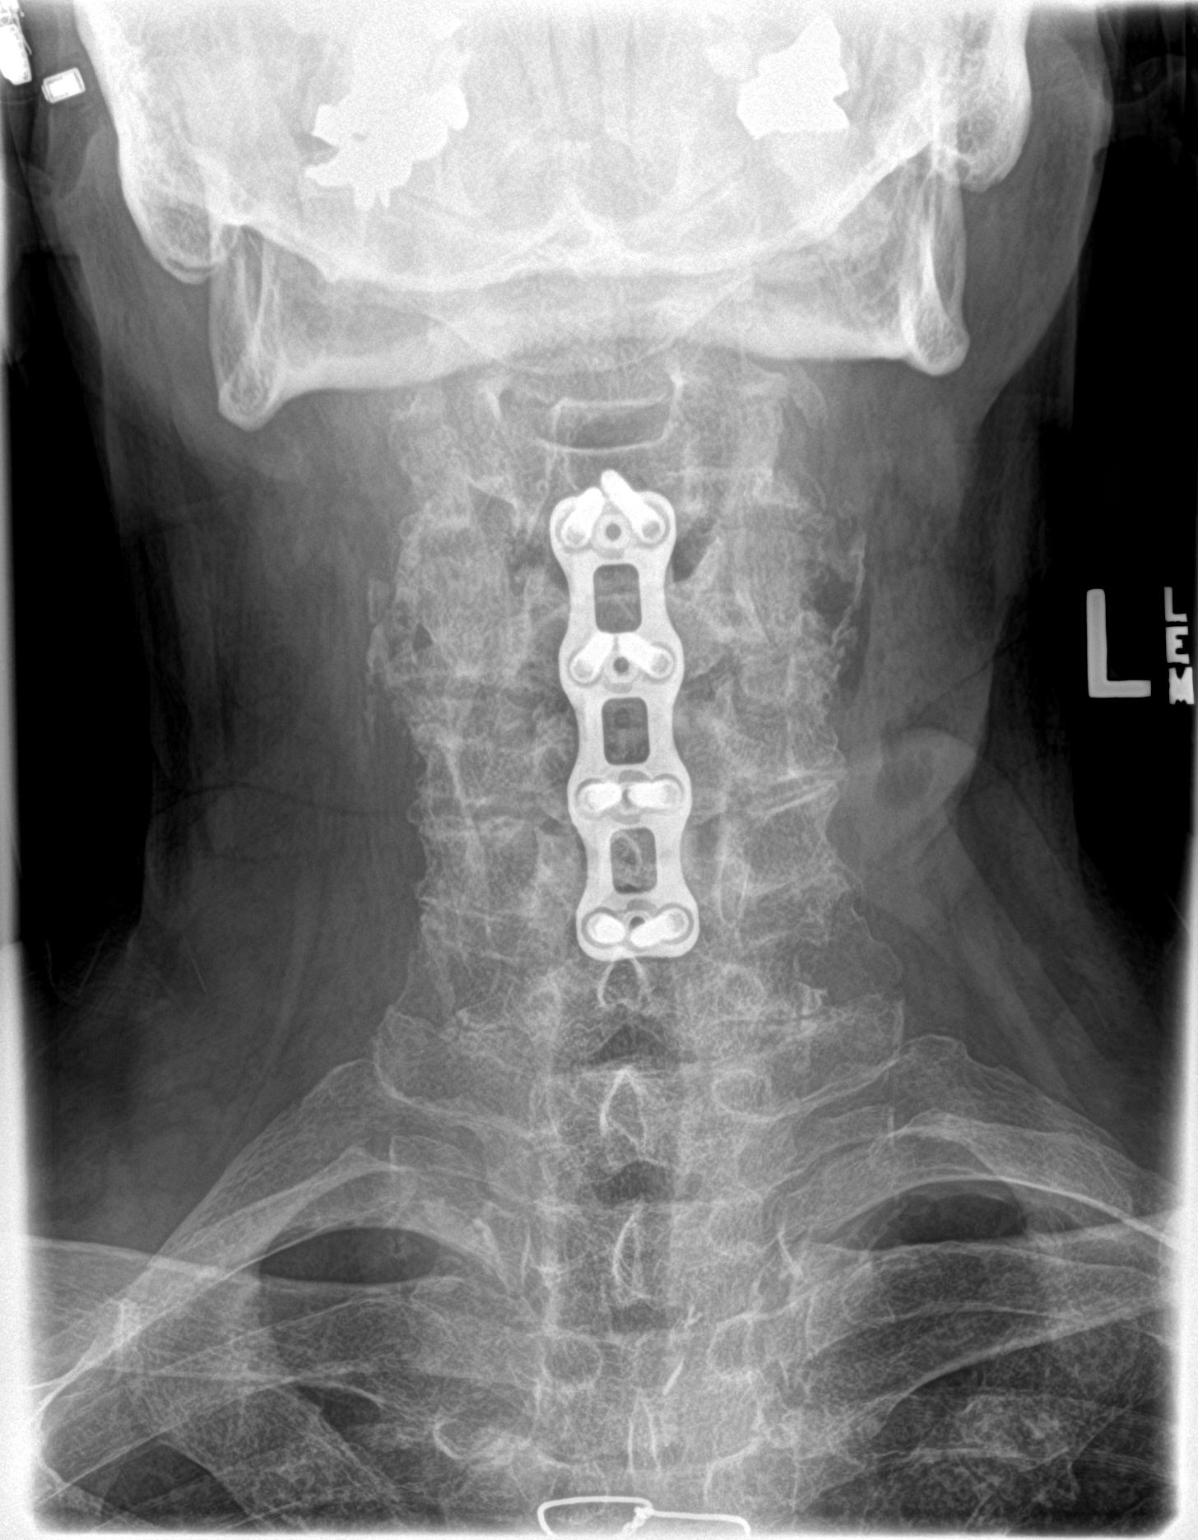
[im 3/3]
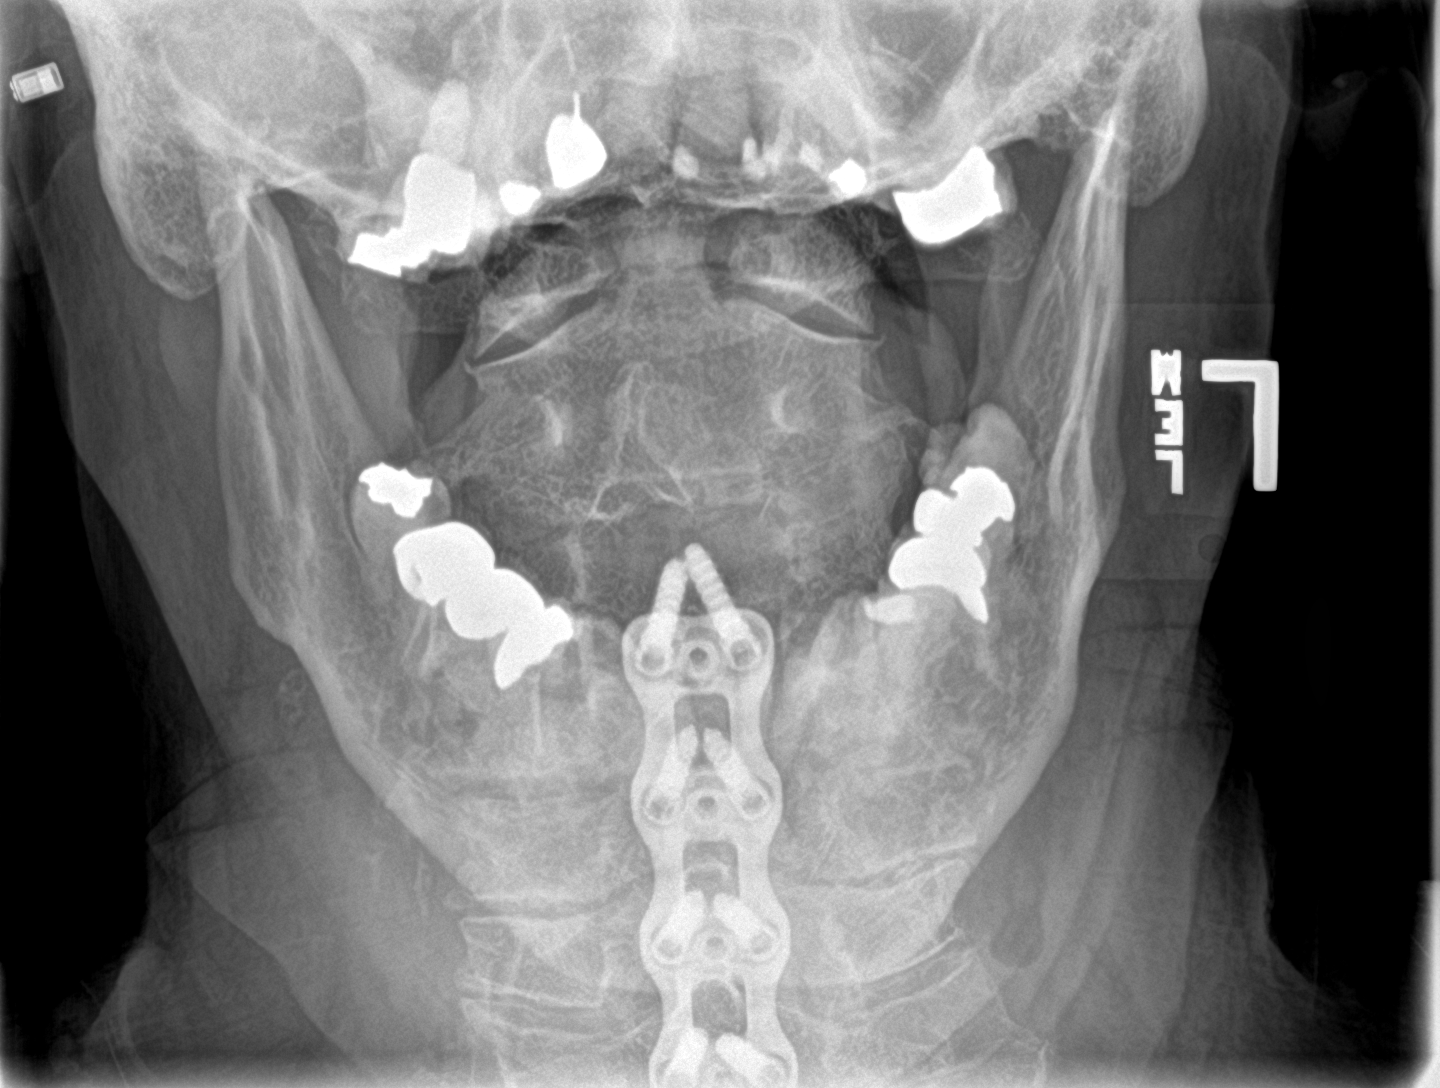

[3 of 3 positions shown; findings below may reference images not displayed]

FINDINGS: Status post surgical anterior fusion of C3-4, C4-5 and C5-6 with
interbody fusion. Moderate degenerative disc disease is also noted
at C6-7. Good alignment of vertebral bodies is noted.
IMPRESSION: Status post surgical anterior fusion of C3-4, C4-5 and C5-6.

## 2019-03-28 ENCOUNTER — Ambulatory Visit (INDEPENDENT_AMBULATORY_CARE_PROVIDER_SITE_OTHER): Payer: PPO | Admitting: Family Medicine

## 2019-03-28 ENCOUNTER — Encounter: Payer: Self-pay | Admitting: Family Medicine

## 2019-03-28 ENCOUNTER — Ambulatory Visit: Payer: Self-pay

## 2019-03-28 ENCOUNTER — Other Ambulatory Visit: Payer: Self-pay

## 2019-03-28 VITALS — BP 132/60 | HR 52 | Temp 97.6°F | Wt 170.0 lb

## 2019-03-28 DIAGNOSIS — R7303 Prediabetes: Secondary | ICD-10-CM | POA: Diagnosis not present

## 2019-03-28 DIAGNOSIS — I10 Essential (primary) hypertension: Secondary | ICD-10-CM | POA: Diagnosis not present

## 2019-03-28 DIAGNOSIS — M48061 Spinal stenosis, lumbar region without neurogenic claudication: Secondary | ICD-10-CM | POA: Diagnosis not present

## 2019-03-28 DIAGNOSIS — I25110 Atherosclerotic heart disease of native coronary artery with unstable angina pectoris: Secondary | ICD-10-CM | POA: Diagnosis not present

## 2019-03-28 DIAGNOSIS — D649 Anemia, unspecified: Secondary | ICD-10-CM | POA: Diagnosis not present

## 2019-03-28 DIAGNOSIS — M1711 Unilateral primary osteoarthritis, right knee: Secondary | ICD-10-CM | POA: Diagnosis not present

## 2019-04-20 ENCOUNTER — Other Ambulatory Visit: Payer: Self-pay

## 2019-04-20 ENCOUNTER — Ambulatory Visit (INDEPENDENT_AMBULATORY_CARE_PROVIDER_SITE_OTHER): Payer: PPO

## 2019-04-20 DIAGNOSIS — Z0000 Encounter for general adult medical examination without abnormal findings: Secondary | ICD-10-CM

## 2019-04-20 NOTE — Patient Instructions (Signed)
Austin [name] , Thank you for taking time to come for your Medicare Wellness Visit. I appreciate your ongoing commitment to your health goals. Please review the following plan we discussed and let me know if I can assist you in the future.   Screening recommendations/referrals: Colonoscopy: No longer required.  Recommended yearly ophthalmology/optometry visit for glaucoma screening and checkup Recommended yearly dental visit for hygiene and checkup  Vaccinations: Influenza vaccine: Up to date Pneumococcal vaccine: Completed series Tdap vaccine: Up to date, due 01/2021 Shingles vaccine: Pt declines today.     Advanced directives: Please bring a copy of your POA (Power of Attorney) and/or Living Will to your next appointment.   Conditions/risks identified: None.  Next appointment: 07/25/19 @ 9:40 AM with Dr Rosanna Randy.   Preventive Care 1 Years and Older, Male Preventive care refers to lifestyle choices and visits with your health care provider that can promote health and wellness. What does preventive care include?  A yearly physical exam. This is also called an annual well check.  Dental exams once or twice a year.  Routine eye exams. Ask your health care provider how often you should have your eyes checked.  Personal lifestyle choices, including:  Daily care of your teeth and gums.  Regular physical activity.  Eating a healthy diet.  Avoiding tobacco and drug use.  Limiting alcohol use.  Practicing safe sex.  Taking low doses of aspirin every day.  Taking vitamin and mineral supplements as recommended by your health care provider. What happens during an annual well check? The services and screenings done by your health care provider during your annual well check will depend on your age, overall health, lifestyle risk factors, and family history of disease. Counseling  Your health care provider may ask you questions about your:  Alcohol use.  Tobacco use.  Drug use.   Emotional well-being.  Home and relationship well-being.  Sexual activity.  Eating habits.  History of falls.  Memory and ability to understand (cognition).  Work and work Statistician. Screening  You may have the following tests or measurements:  Height, weight, and BMI.  Blood pressure.  Lipid and cholesterol levels. These may be checked every 5 years, or more frequently if you are over 21 years old.  Skin check.  Lung cancer screening. You may have this screening every year starting at age 49 if you have a 30-pack-year history of smoking and currently smoke or have quit within the past 15 years.  Fecal occult blood test (FOBT) of the stool. You may have this test every year starting at age 89.  Flexible sigmoidoscopy or colonoscopy. You may have a sigmoidoscopy every 5 years or a colonoscopy every 10 years starting at age 67.  Prostate cancer screening. Recommendations will vary depending on your family history and other risks.  Hepatitis C blood test.  Hepatitis B blood test.  Sexually transmitted disease (STD) testing.  Diabetes screening. This is done by checking your blood sugar (glucose) after you have not eaten for a while (fasting). You may have this done every 1-3 years.  Abdominal aortic aneurysm (AAA) screening. You may need this if you are a current or former smoker.  Osteoporosis. You may be screened starting at age 58 if you are at high risk. Talk with your health care provider about your test results, treatment options, and if necessary, the need for more tests. Vaccines  Your health care provider may recommend certain vaccines, such as:  Influenza vaccine. This is recommended every  year.  Tetanus, diphtheria, and acellular pertussis (Tdap, Td) vaccine. You may need a Td booster every 10 years.  Zoster vaccine. You may need this after age 30.  Pneumococcal 13-valent conjugate (PCV13) vaccine. One dose is recommended after age 30.  Pneumococcal  polysaccharide (PPSV23) vaccine. One dose is recommended after age 51. Talk to your health care provider about which screenings and vaccines you need and how often you need them. This information is not intended to replace advice given to you by your health care provider. Make sure you discuss any questions you have with your health care provider. Document Released: 06/15/2015 Document Revised: 02/06/2016 Document Reviewed: 03/20/2015 Elsevier Interactive Patient Education  2017 [place] Prevention in the Home Falls can cause injuries. They can happen to people of all ages. There are many things you can do to make your home safe and to help prevent falls. What can I do on the outside of my home?  Regularly fix the edges of walkways and driveways and fix any cracks.  Remove anything that might make you trip as you walk through a door, such as a raised step or threshold.  Trim any bushes or trees on the path to your home.  Use bright outdoor lighting.  Clear any walking paths of anything that might make someone trip, such as rocks or tools.  Regularly check to see if handrails are loose or broken. Make sure that both sides of any steps have handrails.  Any raised decks and porches should have guardrails on the edges.  Have any leaves, snow, or ice cleared regularly.  Use sand or salt on walking paths during winter.  Clean up any spills in your garage right away. This includes oil or grease spills. What can I do in the bathroom?  Use night lights.  Install grab bars by the toilet and in the tub and shower. Do not use towel bars as grab bars.  Use non-skid mats or decals in the tub or shower.  If you need to sit down in the shower, use a plastic, non-slip stool.  Keep the floor dry. Clean up any water that spills on the floor as soon as it happens.  Remove soap buildup in the tub or shower regularly.  Attach bath mats securely with double-sided non-slip rug tape.   Do not have throw rugs and other things on the floor that can make you trip. What can I do in the bedroom?  Use night lights.  Make sure that you have a light by your bed that is easy to reach.  Do not use any sheets or blankets that are too big for your bed. They should not hang down onto the floor.  Have a firm chair that has side arms. You can use this for support while you get dressed.  Do not have throw rugs and other things on the floor that can make you trip. What can I do in the kitchen?  Clean up any spills right away.  Avoid walking on wet floors.  Keep items that you use a lot in easy-to-reach places.  If you need to reach something above you, use a strong step stool that has a grab bar.  Keep electrical cords out of the way.  Do not use floor polish or wax that makes floors slippery. If you must use wax, use non-skid floor wax.  Do not have throw rugs and other things on the floor that can make you trip. What can  I do with my stairs?  Do not leave any items on the stairs.  Make sure that there are handrails on both sides of the stairs and use them. Fix handrails that are broken or loose. Make sure that handrails are as long as the stairways.  Check any carpeting to make sure that it is firmly attached to the stairs. Fix any carpet that is loose or worn.  Avoid having throw rugs at the top or bottom of the stairs. If you do have throw rugs, attach them to the floor with carpet tape.  Make sure that you have a light switch at the top of the stairs and the bottom of the stairs. If you do not have them, ask someone to add them for you. What else can I do to help prevent falls?  Wear shoes that:  Do not have high heels.  Have rubber bottoms.  Are comfortable and fit you well.  Are closed at the toe. Do not wear sandals.  If you use a stepladder:  Make sure that it is fully opened. Do not climb a closed stepladder.  Make sure that both sides of the  stepladder are locked into place.  Ask someone to hold it for you, if possible.  Clearly mark and make sure that you can see:  Any grab bars or handrails.  First and last steps.  Where the edge of each step is.  Use tools that help you move around (mobility aids) if they are needed. These include:  Canes.  Walkers.  Scooters.  Crutches.  Turn on the lights when you go into a dark area. Replace any light bulbs as soon as they burn out.  Set up your furniture so you have a clear path. Avoid moving your furniture around.  If any of your floors are uneven, fix them.  If there are any pets around you, be aware of where they are.  Review your medicines with your doctor. Some medicines can make you feel dizzy. This can increase your chance of falling. Ask your doctor what other things that you can do to help prevent falls. This information is not intended to replace advice given to you by your health care provider. Make sure you discuss any questions you have with your health care provider. Document Released: 03/15/2009 Document Revised: 10/25/2015 Document Reviewed: 06/23/2014 Elsevier Interactive Patient Education  2017 [employer].

## 2019-04-20 NOTE — Progress Notes (Signed)
Subjective:   Austin [name] is a 85 y.o. male who presents for Medicare Annual/Subsequent preventive examination.    This visit is being conducted through telemedicine due to the COVID-19 pandemic. This patient has given me verbal consent via doximity to conduct this visit, patient states they are participating from their home address. Some vital signs may be absent or patient reported.    Patient identification: identified by name, DOB, and current address  Review of Systems:  N/A  Cardiac Risk Factors include: advanced age (>13men, >1 women);dyslipidemia;hypertension;male gender;sedentary lifestyle     Objective:    Vitals: There were no vitals taken for this visit.  There is no height or weight on file to calculate BMI. Unable to obtain vitals due to visit being conducted via telephonically.   Advanced Directives 04/20/2019 03/10/2018 03/10/2018 03/02/2018 01/11/2018 12/30/2017 12/23/2017  Does Patient Have a Medical Advance Directive? Yes Yes Yes Yes No Yes Yes  Type of Paramedic of [place];Living will [place];Living will [place];Living will [place];Living will [place];Living will [place];Living will [place];Living will  Does patient want to make changes to medical advance directive? - No - Patient declined No - Patient declined No - Patient declined - No - Patient declined No - Patient declined  Copy of [place] in Chart? No - copy requested Yes Yes Yes Yes No - copy requested No - copy requested    Tobacco Social History   Tobacco Use  Smoking Status Former Smoker  . Years: 15.00  . Types: Cigarettes  . Quit date: 36  . Years since quitting: 30.9  Smokeless Tobacco Never Used     Counseling given: Not Answered   Clinical Intake:  Pre-visit preparation completed: Yes  Pain : No/denies pain Pain  Score: 0-No pain     Nutritional Risks: None Diabetes: No  How often do you need to have someone help you when you read instructions, pamphlets, or other written materials from your doctor or pharmacy?: 1 - Never  Interpreter Needed?: No  Information entered by :: [facility], LPN  Past Medical History:  Diagnosis Date  . Absolute anemia 12/08/2014  . Allergy   . Anginal pain ([place])    much less angina d/t lack of activity  . Arthritis    all over body  . Bradycardia 05/03/2015  . Calculus of kidney 12/08/2014  . Coronary artery disease   . GERD (gastroesophageal reflux disease)   . Glaucoma   . History of kidney stones   . HLD (hyperlipidemia)   . Hyperglycemia 08/27/2015  . Hypertension   . Myocardial infarction ([place]) 2011  . Peripheral vascular disease ([place])   . VHD (valvular heart disease)    Past Surgical History:  Procedure Laterality Date  . ANTERIOR CERVICAL DECOMP/DISCECTOMY FUSION N/A 12/30/2017   Procedure: ANTERIOR CERVICAL DECOMPRESSION/DISCECTOMY FUSION 3 LEVELS- C3-6;  Surgeon: Meade Maw, MD;  Location: [facility];  Service: Neurosurgery;  Laterality: N/A;  . CARDIAC CATHETERIZATION  2007  . CARDIAC CATHETERIZATION N/A 04/17/2015   Procedure: Left Heart Cath and Coronary Angiography;  Surgeon: Corey Skains, MD;  Location: [place] [facility];  Service: Cardiovascular;  Laterality: N/A;  . CARPAL TUNNEL RELEASE Bilateral    right 06/09/08 left 05/03/09  . CATARACT EXTRACTION W/ INTRAOCULAR LENS  IMPLANT, BILATERAL    . COLONOSCOPY N/A 10/02/2014   Procedure: COLONOSCOPY;  Surgeon: Manya Silvas,  MD;  Location: [facility];  Service: Endoscopy;  Laterality: N/A;  . CORONARY ARTERY BYPASS GRAFT  2007  . ESOPHAGOGASTRODUODENOSCOPY    . EYE SURGERY    . HEMORROIDECTOMY    . JOINT REPLACEMENT Right 03/2017   partial knee replacement  . KNEE ARTHROSCOPY Right 08/05/2016   Procedure: ARTHROSCOPY KNEE;  Surgeon: Thornton Park, MD;  Location: [facility] OR[facility];   Service: Orthopedics;  Laterality: Right;  . KNEE ARTHROSCOPY WITH LATERAL RELEASE Right 08/05/2016   Procedure: KNEE ARTHROSCOPY WITH LATERAL RELEASE;  Surgeon: Thornton Park, MD;  Location: [facility];  Service: Orthopedics;  Laterality: Right;  . KNEE ARTHROSCOPY WITH MEDIAL MENISECTOMY Right 08/05/2016   Procedure: KNEE ARTHROSCOPY WITH MEDIAL MENISECTOMY;  Surgeon: Thornton Park, MD;  Location: [facility];  Service: Orthopedics;  Laterality: Right;  Partial  . LUMBAR EPIDURAL INJECTION  2011  . LUMBAR LAMINECTOMY/DECOMPRESSION MICRODISCECTOMY N/A 03/10/2018   Procedure: LUMBAR LAMINECTOMY/DECOMPRESSION MICRODISCECTOMY 3 LEVELS-L2-5;  Surgeon: Meade Maw, MD;  Location: [facility];  Service: Neurosurgery;  Laterality: N/A;  . MOUTH SURGERY     tooth implant  . SHOULDER SURGERY Right 2011   rotator cuff repair  . SYNOVECTOMY Right 08/05/2016   Procedure: SYNOVECTOMY;  Surgeon: Thornton Park, MD;  Location: [facility];  Service: Orthopedics;  Laterality: Right;  arthroscopic extensive synovectomy  . TONSILLECTOMY     Family History  Problem Relation Age of Onset  . Heart disease Mother   . Heart disease Father   . Heart disease Brother   . Heart disease Brother   . Heart disease Brother   . Heart disease Brother   . Heart disease Brother    Social History   Socioeconomic History  . Marital status: Married    Spouse name: Blanch Media  . Number of children: 1  . Years of education: Not on file  . Highest education level: Some college, no degree  Occupational History  . Occupation: Engineer, water: RETIRED  Social Needs  . Financial resource strain: Not hard at all  . Food insecurity    Worry: Never true    Inability: Never true  . Transportation needs    Medical: No    Non-medical: No  Tobacco Use  . Smoking status: Former Smoker    Years: 15.00    Types: Cigarettes    Quit date: 1990    Years since quitting: 30.9  . Smokeless tobacco: Never Used   Substance and Sexual Activity  . Alcohol use: Yes    Alcohol/week: 7.0 - 14.0 standard drinks    Types: 7 - 14 Glasses of wine per week    Comment: 1-2 glasses of wine a night  . Drug use: No  . Sexual activity: Not Currently  Lifestyle  . Physical activity    Days per week: 0 days    Minutes per session: 0 min  . Stress: Not at all  Relationships  . Social Herbalist on phone: Patient refused    Gets together: Patient refused    Attends religious service: Patient refused    Active member of club or organization: Patient refused    Attends meetings of clubs or organizations: Patient refused    Relationship status: Patient refused  Other Topics Concern  . Not on file  Social History Narrative  . Not on file    Outpatient Encounter Medications as of 04/20/2019  Medication Sig  . amLODipine (NORVASC) 5 MG tablet Take 5 mg by mouth  daily.  . atorvastatin (LIPITOR) 20 MG tablet Take 20 mg by mouth daily.   . Biotin 5000 MCG CAPS Take 5,000 mcg by mouth daily.   . Cholecalciferol (VITAMIN D3) 2000 UNITS capsule Take 2,000 Units by mouth 2 (two) times daily.   . Coenzyme Q10 (COQ-10) 200 MG CAPS Take 200 mg by mouth daily.  . dorzolamide-timolol (COSOPT) 22.3-6.8 MG/ML ophthalmic solution Place 1 drop into both eyes 2 (two) times daily.   . isosorbide mononitrate (IMDUR) 60 MG 24 hr tablet Take 60 mg by mouth daily.   . Liniments (SALONPAS PAIN RELIEF PATCH EX) Apply 1 patch topically daily as needed (for pain).  . Melatonin 5 MG CAPS Take 5 mg by mouth at bedtime as needed (for sleep).   . Multiple Vitamins-Minerals (PRESERVISION AREDS 2) CAPS Take 1 capsule by mouth 2 (two) times daily.  . nitroGLYCERIN (NITROSTAT) 0.4 MG SL tablet Place 0.4 mg under the tongue every 5 (five) minutes as needed for chest pain.   . Omega-3 Fatty Acids (FISH OIL) 1200 MG CAPS Take 1,200 mg by mouth daily.   Marland Kitchen omeprazole (PRILOSEC) 20 MG capsule Take 20 mg by mouth daily.   Vladimir Faster  Glycol-Propyl Glycol (SYSTANE OP) Place 1 drop into the right eye daily as needed (for blurriness).   . tamsulosin (FLOMAX) 0.4 MG CAPS capsule TAKE 1 CAPSULE BY MOUTH EVERY DAY  . Travoprost, BAK Free, (TRAVATAN Z) 0.004 % SOLN ophthalmic solution Place 1 drop into both eyes at bedtime.   . Turmeric 500 MG TABS Take 1,000 mg by mouth daily.  . valsartan-hydrochlorothiazide (DIOVAN HCT) 320-25 MG per tablet Take 1 tablet by mouth daily.   . vitamin B-12 (CYANOCOBALAMIN) 1000 MCG tablet Take 1,000 mcg by mouth daily.  . Glucosamine HCl (GLUCOSAMINE PO) Take 1 tablet by mouth 2 (two) times daily.  . methocarbamol (ROBAXIN) 500 MG tablet Take 1 tablet (500 mg total) by mouth every 6 (six) hours as needed for muscle spasms. (Patient not taking: Reported on 01/03/2019)  . ranitidine (ZANTAC) 150 MG capsule Take 1 capsule (150 mg total) by mouth every evening. (Patient not taking: Reported on 02/22/2018)   No facility-administered encounter medications on file as of 04/20/2019.     Activities of Daily Living In your present state of health, do you have any difficulty performing the following activities: 04/20/2019  Hearing? Y  Comment Wears bilateral hearing aids.  Vision? N  Difficulty concentrating or making decisions? N  Walking or climbing stairs? N  Dressing or bathing? N  Doing errands, shopping? N  Preparing Food and eating ? N  Using the Toilet? N  In the past six months, have you accidently leaked urine? Y  Comment Rarely with urges.  Do you have problems with loss of bowel control? N  Managing your Medications? N  Managing your Finances? N  Housekeeping or managing your Housekeeping? N  Some recent data might be hidden    Patient Care Team: Jerrol Banana., MD as PCP - General (Family Medicine) Lovell Sheehan, MD as Consulting Physician (Orthopedic Surgery) Corey Skains, MD as Consulting Physician (Cardiology) Leandrew Koyanagi, MD as Referring Physician  (Ophthalmology) Lucky Cowboy Erskine Squibb, MD as Referring Physician (Vascular Surgery)   Assessment:   This is a routine wellness examination for [place].  Exercise Activities and Dietary recommendations Current Exercise Habits: The patient does not participate in regular exercise at present, Exercise limited by: None identified  Goals    . Reduce portion  size     Recommend to continue to monitor portion sizes and to decrease sodium intake.       Fall Risk: Fall Risk  04/20/2019 01/11/2018 12/22/2017 12/16/2017 10/07/2017  Falls in the past year? 0 No No No No  Number falls in past yr: 0 - - - -  Injury with Fall? 0 - - - -  Risk for fall due to : - - - - -  Risk for fall due to: Comment - - - - -    FALL RISK PREVENTION PERTAINING TO THE HOME:  Any stairs in or around the home? No  If so, are there any without handrails? N/A  Home free of loose throw rugs in walkways, pet beds, electrical cords, etc? Yes  Adequate lighting in your home to reduce risk of falls? Yes   ASSISTIVE DEVICES UTILIZED TO PREVENT FALLS:  Life alert? No  Use of a cane, walker or w/c? Yes  Grab bars in the bathroom? Yes  Shower chair or bench in shower? No  Elevated toilet seat or a handicapped toilet? Yes   TIMED UP AND GO:  Was the test performed? No .    Depression Screen PHQ 2/9 Scores 04/20/2019 01/11/2018 12/22/2017 10/07/2017  PHQ - 2 Score 0 0 0 0  Exception Documentation - - - -    Cognitive Function     6CIT Screen 04/20/2019 03/05/2017  What Year? 0 points 0 points  What month? 0 points 0 points  What time? 0 points 0 points  Count back from 20 0 points 0 points  Months in reverse 0 points 0 points  Repeat phrase 0 points 0 points  Total Score 0 0    Immunization History  Administered Date(s) Administered  . Fluad Quad(high Dose 65+) 02/09/2019  . Influenza, High Dose Seasonal PF 02/27/2016, 04/18/2017, 02/22/2018  . Influenza-Unspecified 03/03/2015  . Pneumococcal Conjugate-13  12/14/2013  . Pneumococcal Polysaccharide-23 04/04/1998, 03/25/2006  . Td 05/01/2008  . Tdap 02/25/2011  . Zoster 10/07/2011    Qualifies for Shingles Vaccine? Yes  Zostavax completed 10/07/11. Due for Shingrix. Pt has been advised to call insurance company to determine out of pocket expense. Advised may also receive vaccine at local pharmacy or Health Dept. Verbalized acceptance and understanding.  Tdap: A.utd  Flu Vaccine: Up to date  Pneumococcal Vaccine: Completed series  Screening Tests Health Maintenance  Topic Date Due  . TETANUS/TDAP  02/24/2021  . INFLUENZA VACCINE  Completed  . PNA vac Low Risk Adult  Completed   Cancer Screenings:  Colorectal Screening: No longer required.   Lung Cancer Screening: (Low Dose CT Chest recommended if Age 18-80 years, 30 pack-year currently smoking OR have quit w/in 15years.) does not qualify.   Additional Screening:  Vision Screening: Recommended annual ophthalmology exams for early detection of glaucoma and other disorders of the eye.  Dental Screening: Recommended annual dental exams for proper oral hygiene  Community Resource Referral:  CRR required this visit?  No        Plan:  I have personally reviewed and addressed the Medicare Annual Wellness questionnaire and have noted the following in the patient's chart:  A. Medical and social history B. Use of alcohol, tobacco or illicit drugs  C. Current medications and supplements D. Functional ability and status E.  Nutritional status F.  Physical activity G. Advance directives H. List of other physicians I.  Hospitalizations, surgeries, and ER visits in previous 12 months J.  Vitals K. Screenings such  as hearing and vision if needed, cognitive and depression L. Referrals and appointments   In addition, I have reviewed and discussed with patient certain preventive protocols, quality metrics, and best practice recommendations. A written personalized care plan for preventive  services as well as general preventive health recommendations were provided to patient.   Glendora Score, LPN  80/08/4713 Nurse Health Advisor   Nurse Notes: None.

## 2019-05-30 ENCOUNTER — Encounter: Payer: Self-pay | Admitting: Physician Assistant

## 2019-05-30 ENCOUNTER — Ambulatory Visit (INDEPENDENT_AMBULATORY_CARE_PROVIDER_SITE_OTHER): Payer: PPO | Admitting: Physician Assistant

## 2019-05-30 DIAGNOSIS — J069 Acute upper respiratory infection, unspecified: Secondary | ICD-10-CM

## 2019-05-30 NOTE — Progress Notes (Signed)
Patient: Austin [name] Male    DOB: 1929-05-26   85 y.o.   MRN: [id number] Visit Date: 05/30/2019  Today's Provider: Mar Daring, PA-C   No chief complaint on file.  Subjective:     Virtual Visit via Telephone Note  I connected with Austin [name] on 05/30/19 at  9:40 AM EST by telephone and verified that I am speaking with the correct person using two identifiers.  Location: Patient: Home Provider: [facility]   I discussed the limitations, risks, security and privacy concerns of performing an evaluation and management service by telephone and the availability of in person appointments. I also discussed with the patient that there may be a patient responsible charge related to this service. The patient expressed understanding and agreed to proceed.  URI  This is a new problem. The current episode started in the past 7 days (Christmas day). The problem has been gradually worsening. The maximum temperature recorded prior to his arrival was 100.4 - 100.9 F. Associated symptoms include coughing, headaches (on Saturday off and on), rhinorrhea and sinus pain. Pertinent negatives include no chest pain, congestion, ear pain, nausea, plugged ear sensation, sneezing, sore throat, swollen glands, vomiting or wheezing. Associated symptoms comments: Scratchy throat. Treatments tried: Aspirin,Mucinex, throat lozenges. The treatment provided mild relief.   Reports he took Mucinex yesterday and was able to cough up more and today has woken up feeling quite well and no fever. Denies loss of smell or taste. He denies any contacts with or suspected with covid-19.   Allergies  Allergen Reactions  . Penicillins Itching, Swelling, Rash and Other (See Comments)    Has patient had a PCN reaction causing immediate rash, facial/tongue/throat swelling, SOB or lightheadedness with hypotension: Yes Has patient had a PCN reaction causing severe rash involving mucus membranes or skin necrosis: No Has  patient had a PCN reaction that required hospitalization No Has patient had a PCN reaction occurring within the last 10 years: No If all of the above answers are "NO", then may proceed with Cephalosporin use.      Current Outpatient Medications:  .  amLODipine (NORVASC) 5 MG tablet, Take 5 mg by mouth daily., Disp: , Rfl:  .  atorvastatin (LIPITOR) 20 MG tablet, Take 20 mg by mouth daily. , Disp: , Rfl:  .  Biotin 5000 MCG CAPS, Take 5,000 mcg by mouth daily. , Disp: , Rfl:  .  Cholecalciferol (VITAMIN D3) 2000 UNITS capsule, Take 2,000 Units by mouth 2 (two) times daily. , Disp: , Rfl:  .  Coenzyme Q10 (COQ-10) 200 MG CAPS, Take 200 mg by mouth daily., Disp: , Rfl:  .  dorzolamide-timolol (COSOPT) 22.3-6.8 MG/ML ophthalmic solution, Place 1 drop into both eyes 2 (two) times daily. , Disp: , Rfl:  .  Glucosamine HCl (GLUCOSAMINE PO), Take 1 tablet by mouth 2 (two) times daily., Disp: , Rfl:  .  isosorbide mononitrate (IMDUR) 60 MG 24 hr tablet, Take 60 mg by mouth daily. , Disp: , Rfl:  .  Melatonin 5 MG CAPS, Take 5 mg by mouth at bedtime as needed (for sleep). , Disp: , Rfl:  .  Multiple Vitamins-Minerals (PRESERVISION AREDS 2) CAPS, Take 1 capsule by mouth 2 (two) times daily., Disp: , Rfl:  .  nitroGLYCERIN (NITROSTAT) 0.4 MG SL tablet, Place 0.4 mg under the tongue every 5 (five) minutes as needed for chest pain. , Disp: , Rfl:  .  Omega-3 Fatty Acids (FISH OIL) 1200  MG CAPS, Take 1,200 mg by mouth daily. , Disp: , Rfl:  .  omeprazole (PRILOSEC) 20 MG capsule, Take 20 mg by mouth daily. , Disp: , Rfl:  .  Polyethyl Glycol-Propyl Glycol (SYSTANE OP), Place 1 drop into the right eye daily as needed (for blurriness). , Disp: , Rfl:  .  tamsulosin (FLOMAX) 0.4 MG CAPS capsule, TAKE 1 CAPSULE BY MOUTH EVERY DAY, Disp: 30 capsule, Rfl: 11 .  Travoprost, BAK Free, (TRAVATAN Z) 0.004 % SOLN ophthalmic solution, Place 1 drop into both eyes at bedtime. , Disp: , Rfl:  .  Turmeric 500 MG TABS, Take  1,000 mg by mouth daily., Disp: , Rfl:  .  valsartan-hydrochlorothiazide (DIOVAN HCT) 320-25 MG per tablet, Take 1 tablet by mouth daily. , Disp: , Rfl:  .  vitamin B-12 (CYANOCOBALAMIN) 1000 MCG tablet, Take 1,000 mcg by mouth daily., Disp: , Rfl:  .  Liniments (SALONPAS PAIN RELIEF PATCH EX), Apply 1 patch topically daily as needed (for pain)., Disp: , Rfl:   Review of Systems  Constitutional: Positive for appetite change (had loss of appetite on 12/26 only), fatigue and fever (low grade; highest was 100.4 and was 100.2). Negative for activity change and chills.  HENT: Positive for postnasal drip, rhinorrhea and sinus pain. Negative for congestion, ear pain, sinus pressure, sneezing, sore throat and trouble swallowing.   Respiratory: Positive for cough. Negative for chest tightness, shortness of breath and wheezing.   Cardiovascular: Negative for chest pain, palpitations and leg swelling.  Gastrointestinal: Negative for nausea and vomiting.  Neurological: Positive for headaches (on Saturday off and on).    Social History   Tobacco Use  . Smoking status: Former Smoker    Years: 15.00    Types: Cigarettes    Quit date: 1990    Years since quitting: 31.0  . Smokeless tobacco: Never Used  Substance Use Topics  . Alcohol use: Yes    Alcohol/week: 7.0 - 14.0 standard drinks    Types: 7 - 14 Glasses of wine per week    Comment: 1-2 glasses of wine a night      Objective:   There were no vitals taken for this visit. There were no vitals filed for this visit.There is no height or weight on file to calculate BMI.   Physical Exam Vitals reviewed.  Constitutional:      General: He is not in acute distress. Pulmonary:     Effort: No respiratory distress.  Neurological:     Mental Status: He is alert.      No results found for any visits on 05/30/19.     Assessment & Plan     1. Viral URI with cough Patient declines Covid-19 testing at this time. Discussed isolation  protocols while symptomatic. He agrees. Continue symptomatic relief with mucinex and tylenol prn. Call if worsening.    I discussed the assessment and treatment plan with the patient. The patient was provided an opportunity to ask questions and all were answered. The patient agreed with the plan and demonstrated an understanding of the instructions.   The patient was advised to call back or seek an in-person evaluation if the symptoms worsen or if the condition fails to improve as anticipated.  I provided 12 minutes of non-face-to-face time during this encounter.    Mar Daring, PA-C  [facility]

## 2019-06-07 DIAGNOSIS — H903 Sensorineural hearing loss, bilateral: Secondary | ICD-10-CM | POA: Diagnosis not present

## 2019-06-09 DIAGNOSIS — H903 Sensorineural hearing loss, bilateral: Secondary | ICD-10-CM | POA: Diagnosis not present

## 2019-07-09 ENCOUNTER — Inpatient Hospital Stay: Payer: PPO

## 2019-07-09 ENCOUNTER — Other Ambulatory Visit: Payer: Self-pay

## 2019-07-09 ENCOUNTER — Encounter: Payer: Self-pay | Admitting: Intensive Care

## 2019-07-09 ENCOUNTER — Emergency Department: Payer: PPO

## 2019-07-09 ENCOUNTER — Inpatient Hospital Stay
Admission: EM | Admit: 2019-07-09 | Discharge: 2019-07-15 | DRG: 280 | Disposition: A | Discharge status: Home / Self Care | Payer: PPO | Attending: Internal Medicine | Admitting: Internal Medicine

## 2019-07-09 DIAGNOSIS — H409 Unspecified glaucoma: Secondary | ICD-10-CM | POA: Diagnosis not present

## 2019-07-09 DIAGNOSIS — I959 Hypotension, unspecified: Secondary | ICD-10-CM | POA: Diagnosis not present

## 2019-07-09 DIAGNOSIS — I252 Old myocardial infarction: Secondary | ICD-10-CM

## 2019-07-09 DIAGNOSIS — I130 Hypertensive heart and chronic kidney disease with heart failure and stage 1 through stage 4 chronic kidney disease, or unspecified chronic kidney disease: Secondary | ICD-10-CM | POA: Diagnosis not present

## 2019-07-09 DIAGNOSIS — E785 Hyperlipidemia, unspecified: Secondary | ICD-10-CM | POA: Diagnosis not present

## 2019-07-09 DIAGNOSIS — Z96651 Presence of right artificial knee joint: Secondary | ICD-10-CM | POA: Diagnosis present

## 2019-07-09 DIAGNOSIS — Z9841 Cataract extraction status, right eye: Secondary | ICD-10-CM

## 2019-07-09 DIAGNOSIS — Z951 Presence of aortocoronary bypass graft: Secondary | ICD-10-CM

## 2019-07-09 DIAGNOSIS — N170 Acute kidney failure with tubular necrosis: Secondary | ICD-10-CM | POA: Diagnosis not present

## 2019-07-09 DIAGNOSIS — R001 Bradycardia, unspecified: Secondary | ICD-10-CM | POA: Diagnosis not present

## 2019-07-09 DIAGNOSIS — J9601 Acute respiratory failure with hypoxia: Secondary | ICD-10-CM | POA: Diagnosis present

## 2019-07-09 DIAGNOSIS — Z9089 Acquired absence of other organs: Secondary | ICD-10-CM

## 2019-07-09 DIAGNOSIS — I10 Essential (primary) hypertension: Secondary | ICD-10-CM | POA: Diagnosis present

## 2019-07-09 DIAGNOSIS — Z961 Presence of intraocular lens: Secondary | ICD-10-CM | POA: Diagnosis present

## 2019-07-09 DIAGNOSIS — Z880 Allergy status to penicillin: Secondary | ICD-10-CM

## 2019-07-09 DIAGNOSIS — J984 Other disorders of lung: Secondary | ICD-10-CM

## 2019-07-09 DIAGNOSIS — N179 Acute kidney failure, unspecified: Secondary | ICD-10-CM

## 2019-07-09 DIAGNOSIS — Z20822 Contact with and (suspected) exposure to covid-19: Secondary | ICD-10-CM | POA: Diagnosis present

## 2019-07-09 DIAGNOSIS — R0602 Shortness of breath: Secondary | ICD-10-CM | POA: Diagnosis not present

## 2019-07-09 DIAGNOSIS — Z79899 Other long term (current) drug therapy: Secondary | ICD-10-CM

## 2019-07-09 DIAGNOSIS — I5033 Acute on chronic diastolic (congestive) heart failure: Secondary | ICD-10-CM | POA: Diagnosis not present

## 2019-07-09 DIAGNOSIS — Z87891 Personal history of nicotine dependence: Secondary | ICD-10-CM

## 2019-07-09 DIAGNOSIS — Z79890 Hormone replacement therapy (postmenopausal): Secondary | ICD-10-CM

## 2019-07-09 DIAGNOSIS — I2510 Atherosclerotic heart disease of native coronary artery without angina pectoris: Secondary | ICD-10-CM | POA: Diagnosis present

## 2019-07-09 DIAGNOSIS — Z8249 Family history of ischemic heart disease and other diseases of the circulatory system: Secondary | ICD-10-CM

## 2019-07-09 DIAGNOSIS — R7303 Prediabetes: Secondary | ICD-10-CM | POA: Diagnosis not present

## 2019-07-09 DIAGNOSIS — I4519 Other right bundle-branch block: Secondary | ICD-10-CM | POA: Diagnosis not present

## 2019-07-09 DIAGNOSIS — N1832 Chronic kidney disease, stage 3b: Secondary | ICD-10-CM | POA: Diagnosis not present

## 2019-07-09 DIAGNOSIS — I4891 Unspecified atrial fibrillation: Secondary | ICD-10-CM | POA: Diagnosis not present

## 2019-07-09 DIAGNOSIS — Z981 Arthrodesis status: Secondary | ICD-10-CM | POA: Diagnosis not present

## 2019-07-09 DIAGNOSIS — I509 Heart failure, unspecified: Secondary | ICD-10-CM | POA: Diagnosis not present

## 2019-07-09 DIAGNOSIS — K219 Gastro-esophageal reflux disease without esophagitis: Secondary | ICD-10-CM | POA: Diagnosis not present

## 2019-07-09 DIAGNOSIS — R079 Chest pain, unspecified: Secondary | ICD-10-CM

## 2019-07-09 DIAGNOSIS — I25110 Atherosclerotic heart disease of native coronary artery with unstable angina pectoris: Secondary | ICD-10-CM | POA: Diagnosis not present

## 2019-07-09 DIAGNOSIS — G4733 Obstructive sleep apnea (adult) (pediatric): Secondary | ICD-10-CM | POA: Diagnosis not present

## 2019-07-09 DIAGNOSIS — I6523 Occlusion and stenosis of bilateral carotid arteries: Secondary | ICD-10-CM | POA: Diagnosis not present

## 2019-07-09 DIAGNOSIS — M159 Polyosteoarthritis, unspecified: Secondary | ICD-10-CM | POA: Diagnosis not present

## 2019-07-09 DIAGNOSIS — I214 Non-ST elevation (NSTEMI) myocardial infarction: Secondary | ICD-10-CM | POA: Diagnosis not present

## 2019-07-09 DIAGNOSIS — Z9842 Cataract extraction status, left eye: Secondary | ICD-10-CM

## 2019-07-09 DIAGNOSIS — Z86010 Personal history of colonic polyps: Secondary | ICD-10-CM

## 2019-07-09 DIAGNOSIS — Z87442 Personal history of urinary calculi: Secondary | ICD-10-CM

## 2019-07-09 DIAGNOSIS — I350 Nonrheumatic aortic (valve) stenosis: Secondary | ICD-10-CM | POA: Diagnosis present

## 2019-07-09 DIAGNOSIS — I200 Unstable angina: Secondary | ICD-10-CM | POA: Diagnosis not present

## 2019-07-09 LAB — CBC
HCT: 37.7 % — ABNORMAL LOW (ref 39.0–52.0)
Hemoglobin: 12.5 g/dL — ABNORMAL LOW (ref 13.0–17.0)
MCH: 30.8 pg (ref 26.0–34.0)
MCHC: 33.2 g/dL (ref 30.0–36.0)
MCV: 92.9 fL (ref 80.0–100.0)
Platelets: 164 10*3/uL (ref 150–400)
RBC: 4.06 MIL/uL — ABNORMAL LOW (ref 4.22–5.81)
RDW: 13.1 % (ref 11.5–15.5)
WBC: 10 10*3/uL (ref 4.0–10.5)
nRBC: 0 % (ref 0.0–0.2)

## 2019-07-09 LAB — TROPONIN I (HIGH SENSITIVITY)
Troponin I (High Sensitivity): 41 ng/L — ABNORMAL HIGH (ref <18)
Troponin I (High Sensitivity): 51 ng/L — ABNORMAL HIGH (ref <18)
Troponin I (High Sensitivity): 240 ng/L (ref <18)

## 2019-07-09 LAB — COMPREHENSIVE METABOLIC PANEL
ALT: 24 U/L (ref 0–44)
AST: 22 U/L (ref 15–41)
Albumin: 4.1 g/dL (ref 3.5–5.0)
Alkaline Phosphatase: 82 U/L (ref 38–126)
Anion gap: 15 (ref 5–15)
BUN: 41 mg/dL — ABNORMAL HIGH (ref 8–23)
CO2: 18 mmol/L — ABNORMAL LOW (ref 22–32)
Calcium: 9.1 mg/dL (ref 8.9–10.3)
Chloride: 102 mmol/L (ref 98–111)
Creatinine, Ser: 2.07 mg/dL — ABNORMAL HIGH (ref 0.61–1.24)
GFR calc Af Amer: 32 mL/min — ABNORMAL LOW (ref >60)
GFR calc non Af Amer: 28 mL/min — ABNORMAL LOW (ref >60)
Glucose, Bld: 137 mg/dL — ABNORMAL HIGH (ref 70–99)
Potassium: 4.2 mmol/L (ref 3.5–5.1)
Sodium: 135 mmol/L (ref 135–145)
Total Bilirubin: 1.9 mg/dL — ABNORMAL HIGH (ref 0.3–1.2)
Total Protein: 7.8 g/dL (ref 6.5–8.1)

## 2019-07-09 LAB — RESPIRATORY PANEL BY RT PCR (FLU A&B, COVID)
Influenza A by PCR: NEGATIVE
Influenza B by PCR: NEGATIVE
SARS Coronavirus 2 by RT PCR: NEGATIVE

## 2019-07-09 LAB — HEPARIN LEVEL (UNFRACTIONATED): Heparin Unfractionated: <0.1 IU/mL — ABNORMAL LOW (ref 0.30–0.70)

## 2019-07-09 LAB — MRSA PCR SCREENING: MRSA by PCR: NEGATIVE

## 2019-07-09 LAB — TYPE AND SCREEN
ABO/RH(D): O NEG
Antibody Screen: NEGATIVE

## 2019-07-09 LAB — PROCALCITONIN: Procalcitonin: 0.18 ng/mL

## 2019-07-09 LAB — APTT: aPTT: 46 seconds — ABNORMAL HIGH (ref 24–36)

## 2019-07-09 LAB — GLUCOSE, CAPILLARY: Glucose-Capillary: 141 mg/dL — ABNORMAL HIGH (ref 70–99)

## 2019-07-09 LAB — PROTIME-INR
INR: 1.3 — ABNORMAL HIGH (ref 0.8–1.2)
Prothrombin Time: 15.7 seconds — ABNORMAL HIGH (ref 11.4–15.2)

## 2019-07-09 LAB — BRAIN NATRIURETIC PEPTIDE: B Natriuretic Peptide: 304 pg/mL — ABNORMAL HIGH (ref 0.0–100.0)

## 2019-07-09 MED ORDER — ATORVASTATIN CALCIUM 20 MG PO TABS: 20.0000 mg | ORAL_TABLET | Freq: Every day | ORAL | Status: DC

## 2019-07-09 MED ORDER — METOPROLOL TARTRATE 25 MG PO TABS
12.5000 mg | ORAL_TABLET | Freq: Two times a day (BID) | ORAL | Status: DC
  Administered 2019-07-09 – 2019-07-11 (×5): 12.5 mg via ORAL
  Filled 2019-07-09 (×6): qty 1

## 2019-07-09 MED ORDER — DORZOLAMIDE HCL-TIMOLOL MAL 2-0.5 % OP SOLN
1.0000 [drp] | Freq: Two times a day (BID) | OPHTHALMIC | Status: DC
  Administered 2019-07-09 – 2019-07-15 (×13): 1 [drp] via OPHTHALMIC
  Filled 2019-07-09: qty 10

## 2019-07-09 MED ORDER — SALONPAS PAIN RELIEF PATCH EX PTCH: MEDICATED_PATCH | Freq: Every day | CUTANEOUS | Status: DC | PRN

## 2019-07-09 MED ORDER — SULFAMETHOXAZOLE-TRIMETHOPRIM 400-80 MG PO TABS
1.0000 | ORAL_TABLET | Freq: Two times a day (BID) | ORAL | Status: DC
  Administered 2019-07-09 – 2019-07-12 (×6): 1 via ORAL
  Filled 2019-07-09 (×7): qty 1

## 2019-07-09 MED ORDER — MORPHINE SULFATE (PF) 2 MG/ML IV SOLN
INTRAVENOUS | Status: AC
  Filled 2019-07-09: qty 1

## 2019-07-09 MED ORDER — NITROGLYCERIN 0.4 MG SL SUBL: 0.4000 mg | SUBLINGUAL_TABLET | SUBLINGUAL | Status: DC | PRN

## 2019-07-09 MED ORDER — ORAL CARE MOUTH RINSE
15.0000 mL | Freq: Two times a day (BID) | OROMUCOSAL | Status: DC
  Administered 2019-07-10 – 2019-07-14 (×7): 15 mL via OROMUCOSAL

## 2019-07-09 MED ORDER — PANTOPRAZOLE SODIUM 40 MG IV SOLR
40.0000 mg | Freq: Two times a day (BID) | INTRAVENOUS | Status: DC
  Administered 2019-07-09 – 2019-07-11 (×5): 40 mg via INTRAVENOUS
  Filled 2019-07-09 (×6): qty 40

## 2019-07-09 MED ORDER — TAMSULOSIN HCL 0.4 MG PO CAPS
0.4000 mg | ORAL_CAPSULE | Freq: Every day | ORAL | Status: DC
  Administered 2019-07-09 – 2019-07-14 (×6): 0.4 mg via ORAL
  Filled 2019-07-09 (×7): qty 1

## 2019-07-09 MED ORDER — ASPIRIN EC 81 MG PO TBEC
81.0000 mg | DELAYED_RELEASE_TABLET | Freq: Every day | ORAL | Status: DC
  Administered 2019-07-10 – 2019-07-14 (×5): 81 mg via ORAL
  Filled 2019-07-09 (×6): qty 1

## 2019-07-09 MED ORDER — FUROSEMIDE 10 MG/ML IJ SOLN
20.0000 mg | Freq: Once | INTRAMUSCULAR | Status: AC
  Administered 2019-07-09: 12:00:00 20 mg via INTRAVENOUS
  Filled 2019-07-09: qty 4

## 2019-07-09 MED ORDER — MORPHINE SULFATE (PF) 4 MG/ML IV SOLN
4.0000 mg | Freq: Once | INTRAVENOUS | Status: AC
  Administered 2019-07-09: 4 mg via INTRAVENOUS
  Filled 2019-07-09: qty 1

## 2019-07-09 MED ORDER — FUROSEMIDE 10 MG/ML IJ SOLN
40.0000 mg | Freq: Four times a day (QID) | INTRAMUSCULAR | Status: DC
  Administered 2019-07-09 – 2019-07-11 (×6): 40 mg via INTRAVENOUS
  Filled 2019-07-09 (×8): qty 4

## 2019-07-09 MED ORDER — LATANOPROST 0.005 % OP SOLN
1.0000 [drp] | Freq: Every day | OPHTHALMIC | Status: DC
  Administered 2019-07-09 – 2019-07-14 (×6): 1 [drp] via OPHTHALMIC
  Filled 2019-07-09: qty 2.5

## 2019-07-09 MED ORDER — POLYVINYL ALCOHOL 1.4 % OP SOLN
Freq: Every day | OPHTHALMIC | Status: DC | PRN
  Filled 2019-07-09: qty 15

## 2019-07-09 MED ORDER — ASPIRIN 81 MG PO CHEW
CHEWABLE_TABLET | ORAL | Status: AC
  Filled 2019-07-09: qty 4

## 2019-07-09 MED ORDER — ACETAMINOPHEN 325 MG PO TABS
650.0000 mg | ORAL_TABLET | ORAL | Status: DC | PRN
  Administered 2019-07-10: 21:00:00 650 mg via ORAL
  Filled 2019-07-09 (×2): qty 2

## 2019-07-09 MED ORDER — HEPARIN BOLUS VIA INFUSION
4000.0000 [IU] | Freq: Once | INTRAVENOUS | Status: AC
  Administered 2019-07-09: 4000 [IU] via INTRAVENOUS
  Filled 2019-07-09: qty 4000

## 2019-07-09 MED ORDER — FUROSEMIDE 10 MG/ML IJ SOLN
40.0000 mg | Freq: Once | INTRAMUSCULAR | Status: AC
  Administered 2019-07-09: 40 mg via INTRAVENOUS

## 2019-07-09 MED ORDER — HEPARIN (PORCINE) 25000 UT/250ML-% IV SOLN
1650.0000 [IU]/h | INTRAVENOUS | Status: DC
  Administered 2019-07-09: 900 [IU]/h via INTRAVENOUS
  Administered 2019-07-10: 07:00:00 1100 [IU]/h via INTRAVENOUS
  Administered 2019-07-11: 1500 [IU]/h via INTRAVENOUS
  Administered 2019-07-12: 1650 [IU]/h via INTRAVENOUS
  Filled 2019-07-09 (×5): qty 250

## 2019-07-09 MED ORDER — MORPHINE SULFATE (PF) 2 MG/ML IV SOLN: 2.0000 mg | INTRAVENOUS | Status: DC | PRN

## 2019-07-09 MED ORDER — NITROGLYCERIN 0.4 MG SL SUBL
0.4000 mg | SUBLINGUAL_TABLET | SUBLINGUAL | Status: DC | PRN
  Administered 2019-07-09 (×2): 0.4 mg via SUBLINGUAL
  Filled 2019-07-09: qty 1

## 2019-07-09 MED ORDER — CHLORHEXIDINE GLUCONATE 0.12 % MT SOLN
15.0000 mL | Freq: Two times a day (BID) | OROMUCOSAL | Status: DC
  Administered 2019-07-09 – 2019-07-14 (×11): 15 mL via OROMUCOSAL
  Filled 2019-07-09 (×12): qty 15

## 2019-07-09 MED ORDER — ONDANSETRON HCL 4 MG/2ML IJ SOLN
4.0000 mg | Freq: Once | INTRAMUSCULAR | Status: AC
  Administered 2019-07-09: 11:00:00 4 mg via INTRAVENOUS
  Filled 2019-07-09: qty 2

## 2019-07-09 MED ORDER — MORPHINE SULFATE (PF) 2 MG/ML IV SOLN
2.0000 mg | INTRAVENOUS | Status: AC
  Administered 2019-07-09: 2 mg via INTRAVENOUS

## 2019-07-09 MED ORDER — NITROGLYCERIN IN D5W 200-5 MCG/ML-% IV SOLN
0.0000 ug/min | INTRAVENOUS | Status: DC
  Administered 2019-07-09: 18:00:00 5 ug/min via INTRAVENOUS
  Filled 2019-07-09: qty 250

## 2019-07-09 MED ORDER — OMEGA-3-ACID ETHYL ESTERS 1 G PO CAPS
1.0000 g | ORAL_CAPSULE | Freq: Two times a day (BID) | ORAL | Status: DC
  Administered 2019-07-10 – 2019-07-15 (×11): 1 g via ORAL
  Filled 2019-07-09 (×11): qty 1

## 2019-07-09 MED ORDER — FUROSEMIDE 10 MG/ML IJ SOLN
INTRAMUSCULAR | Status: AC
  Filled 2019-07-09: qty 4

## 2019-07-09 MED ORDER — ONDANSETRON HCL 4 MG/2ML IJ SOLN: 4.0000 mg | Freq: Four times a day (QID) | INTRAMUSCULAR | Status: DC | PRN

## 2019-07-09 MED ORDER — OCUVITE-LUTEIN PO CAPS
1.0000 | ORAL_CAPSULE | Freq: Two times a day (BID) | ORAL | Status: DC
  Administered 2019-07-10 – 2019-07-14 (×9): 1 via ORAL
  Filled 2019-07-09 (×14): qty 1

## 2019-07-09 MED ORDER — MELATONIN 5 MG PO TABS: 5.0000 mg | ORAL_TABLET | Freq: Every evening | ORAL | Status: DC | PRN

## 2019-07-09 MED ORDER — ATORVASTATIN CALCIUM 80 MG PO TABS
80.0000 mg | ORAL_TABLET | Freq: Every day | ORAL | Status: DC
  Administered 2019-07-10 – 2019-07-14 (×5): 80 mg via ORAL
  Filled 2019-07-09: qty 1
  Filled 2019-07-09: qty 4
  Filled 2019-07-09 (×2): qty 1
  Filled 2019-07-09 (×2): qty 4

## 2019-07-09 MED ORDER — ASPIRIN 81 MG PO CHEW
324.0000 mg | CHEWABLE_TABLET | Freq: Once | ORAL | Status: AC
  Administered 2019-07-09: 324 mg via ORAL

## 2019-07-09 NOTE — Assessment & Plan Note (Signed)
IV ppi therapy.  

## 2019-07-09 NOTE — Assessment & Plan Note (Signed)
We will continue pt on his home eye drops.   Facility-Administered Medications   dorzolamide-timolol (COSOPT) 22.3-6.8 MG/ML ophthalmic solution 1 drop     1 drop, Both Eyes, BID  07/09/2019    latanoprost (XALATAN) 0.005 % ophthalmic solution 1 drop     1 drop, Both Eyes, QHS  07/09/2019    polyvinyl alcohol (LIQUIFILM TEARS) 1.4 % ophthalmic solution     Right Eye, Daily PRN for blurriness  07/09/2019

## 2019-07-09 NOTE — H&P (Signed)
History and Physical    Austin [name] NUU:[id number] DOB: 1929/04/27 DOA: 07/09/2019   PCP: Jerrol Banana., MD   Outpatient Specialists: [facility].   Patient coming from: home  Chief Complaint: Chest Pain  HPI: Austin [name] is a 86 y.o. male with medical history significant of CAD/CABG/HTN/ANEMIA seen I ned for chest pain.  Chest pain that started several days ago when he took nitro  And it resolved. He has been having sob on exertion with minimal discomfort. After that episode Today am at 3 am he was up to go to bathroom and took one nitro and rested and Relaxed and resolved. Then today at 8:45 chest pain and sob 7/10, pressure, pt had nausea No vomiting, pain is left sided and radiated to left jaw area. No travel reported. Pt lives with wife. Pt has had CABG in past. He sees dr. Nehemiah [name] . Currently he has not chest pain or pressure. Pt takes ibuprofen and 3-4 times a week for headaches and history of arthritis in his back. Pt is currently a nonsmoker and quit is 1980's.  ED Course:  Blood pressure (!) 146/93, pulse (!) 117, temperature 98 F (36.7 C), temperature source Oral, resp. rate (!) 30, height 5\' 6"  (1.676 m), weight 78.4 kg, SpO2 91 %. Pt given nitro and started on heparin drip as pt has twi and positive troponin's. Cardiology was consulted.  Review of Systems: As per HPI otherwise 10 point review of systems negative.    Past Medical History:  Diagnosis Date  . Absolute anemia 12/08/2014  . Allergy   . Anginal pain ([place])    much less angina d/t lack of activity  . Arthritis    all over body  . Bradycardia 05/03/2015  . Calculus of kidney 12/08/2014  . Coronary artery disease   . GERD (gastroesophageal reflux disease)   . Glaucoma   . History of kidney stones   . HLD (hyperlipidemia)   . Hyperglycemia 08/27/2015  . Hypertension   . Myocardial infarction ([place]) 2011  . Peripheral vascular disease ([place])   . VHD (valvular heart disease)       Past Surgical History:  Procedure Laterality Date  . ANTERIOR CERVICAL DECOMP/DISCECTOMY FUSION N/A 12/30/2017   Procedure: ANTERIOR CERVICAL DECOMPRESSION/DISCECTOMY FUSION 3 LEVELS- C3-6;  Surgeon: Meade Maw, MD;  Location: [facility] OR[facility];  Service: Neurosurgery;  Laterality: N/A;  . CARDIAC CATHETERIZATION  2007  . CARDIAC CATHETERIZATION N/A 04/17/2015   Procedure: Left Heart Cath and Coronary Angiography;  Surgeon: Corey Skains, MD;  Location: [facility];  Service: Cardiovascular;  Laterality: N/A;  . CARPAL TUNNEL RELEASE Bilateral    right 06/09/08 left 05/03/09  . CATARACT EXTRACTION W/ INTRAOCULAR LENS  IMPLANT, BILATERAL    . COLONOSCOPY N/A 10/02/2014   Procedure: COLONOSCOPY;  Surgeon: Manya Silvas, MD;  Location: [facility];  Service: Endoscopy;  Laterality: N/A;  . CORONARY ARTERY BYPASS GRAFT  2007  . ESOPHAGOGASTRODUODENOSCOPY    . EYE SURGERY    . HEMORROIDECTOMY    . JOINT REPLACEMENT Right 03/2017   partial knee replacement  . KNEE ARTHROSCOPY Right 08/05/2016   Procedure: ARTHROSCOPY KNEE;  Surgeon: Thornton Park, MD;  Location: [facility] ORS;  Service: Orthopedics;  Laterality: Right;  . KNEE ARTHROSCOPY WITH LATERAL RELEASE Right 08/05/2016   Procedure: KNEE ARTHROSCOPY WITH LATERAL RELEASE;  Surgeon: Thornton Park, MD;  Location: [facility];  Service: Orthopedics;  Laterality: Right;  . KNEE ARTHROSCOPY WITH MEDIAL MENISECTOMY Right 08/05/2016  Procedure: KNEE ARTHROSCOPY WITH MEDIAL MENISECTOMY;  Surgeon: Thornton Park, MD;  Location: [facility];  Service: Orthopedics;  Laterality: Right;  Partial  . LUMBAR EPIDURAL INJECTION  2011  . LUMBAR LAMINECTOMY/DECOMPRESSION MICRODISCECTOMY N/A 03/10/2018   Procedure: LUMBAR LAMINECTOMY/DECOMPRESSION MICRODISCECTOMY 3 LEVELS-L2-5;  Surgeon: Meade Maw, MD;  Location: [facility];  Service: Neurosurgery;  Laterality: N/A;  . MOUTH SURGERY     tooth implant  . SHOULDER SURGERY Right 2011   rotator cuff  repair  . SYNOVECTOMY Right 08/05/2016   Procedure: SYNOVECTOMY;  Surgeon: Thornton Park, MD;  Location: [facility];  Service: Orthopedics;  Laterality: Right;  arthroscopic extensive synovectomy  . TONSILLECTOMY       reports that he quit smoking about 31 years ago. His smoking use included cigarettes. He quit after 15.00 years of use. He has never used smokeless tobacco. He reports current alcohol use of about 7.0 - 14.0 standard drinks of alcohol per week. He reports that he does not use drugs.  Allergies  Allergen Reactions  . Penicillins Itching, Swelling, Rash and Other (See Comments)    Has patient had a PCN reaction causing immediate rash, facial/tongue/throat swelling, SOB or lightheadedness with hypotension: Yes Has patient had a PCN reaction causing severe rash involving mucus membranes or skin necrosis: No Has patient had a PCN reaction that required hospitalization No Has patient had a PCN reaction occurring within the last 10 years: No If all of the above answers are "NO", then may proceed with Cephalosporin use.     Family History  Problem Relation Age of Onset  . Heart disease Mother   . Heart disease Father   . Heart disease Brother   . Heart disease Brother   . Heart disease Brother   . Heart disease Brother   . Heart disease Brother    Unacceptable: Noncontributory, unremarkable, or negative. Acceptable: Family history reviewed and not pertinent (If you reviewed it)  Prior to Admission medications   Medication Sig Start Date End Date Taking? Authorizing Provider  nitroGLYCERIN (NITROSTAT) 0.4 MG SL tablet Place 0.4 mg under the tongue every 5 (five) minutes as needed for chest pain.  02/25/11  Yes [provider]  amLODipine (NORVASC) 5 MG tablet Take 5 mg by mouth daily.    [provider]  atorvastatin (LIPITOR) 20 MG tablet Take 20 mg by mouth daily.  04/24/14   [provider]  Biotin 5000 MCG CAPS Take 5,000 mcg by mouth daily.      [provider]  Cholecalciferol (VITAMIN D3) 2000 UNITS capsule Take 2,000 Units by mouth 2 (two) times daily.     [provider]  Coenzyme Q10 (COQ-10) 200 MG CAPS Take 200 mg by mouth daily.    [provider]  dorzolamide-timolol (COSOPT) 22.3-6.8 MG/ML ophthalmic solution Place 1 drop into both eyes 2 (two) times daily.  07/03/15   [provider]  Glucosamine HCl (GLUCOSAMINE PO) Take 1 tablet by mouth 2 (two) times daily.    [provider]  isosorbide mononitrate (IMDUR) 60 MG 24 hr tablet Take 60 mg by mouth daily.  06/09/16   [provider]  Liniments (SALONPAS PAIN RELIEF PATCH EX) Apply 1 patch topically daily as needed (for pain).    [provider]  Melatonin 5 MG CAPS Take 5 mg by mouth at bedtime as needed (for sleep).     [provider]  Multiple Vitamins-Minerals (PRESERVISION AREDS 2) CAPS Take 1 capsule by mouth 2 (two) times daily.  [provider]  Omega-3 Fatty Acids (FISH OIL) 1200 MG CAPS Take 1,200 mg by mouth daily.     [provider]  omeprazole (PRILOSEC) 20 MG capsule Take 20 mg by mouth daily.  07/28/15   [provider]  Polyethyl Glycol-Propyl Glycol (SYSTANE OP) Place 1 drop into the right eye daily as needed (for blurriness).     [provider]  tamsulosin (FLOMAX) 0.4 MG CAPS capsule TAKE 1 CAPSULE BY MOUTH EVERY DAY Patient taking differently: Take 0.4 mg by mouth daily.  11/10/18   Jerrol Banana., MD  Travoprost, BAK Free, (TRAVATAN Z) 0.004 % SOLN ophthalmic solution Place 1 drop into both eyes at bedtime.  08/12/11   [provider]  Turmeric 500 MG TABS Take 1,000 mg by mouth daily.    [provider]  valsartan-hydrochlorothiazide (DIOVAN HCT) 320-25 MG per tablet Take 1 tablet by mouth daily.     [provider]  vitamin B-12 (CYANOCOBALAMIN) 1000 MCG tablet Take 1,000 mcg by mouth daily.    [provider]     Physical Exam: Vitals:   07/09/19 1523 07/09/19 1641 07/09/19 1643 07/09/19 1700  BP: (!) 154/87  (!) 158/72 (!) 146/93  Pulse:  (!) 117 (!) 117 (!) 117  Resp: 20 (!) 26 (!) 30 (!) 30  Temp: 98 F (36.7 C)     TempSrc: Oral     SpO2: 92% (!) 78% (!) 88% 91%  Weight: 78.4 kg     Height: 5\' 6"  (1.676 m)         Constitutional: NAD, calm, comfortable Vitals:   07/09/19 1523 07/09/19 1641 07/09/19 1643 07/09/19 1700  BP: (!) 154/87  (!) 158/72 (!) 146/93  Pulse:  (!) 117 (!) 117 (!) 117  Resp: 20 (!) 26 (!) 30 (!) 30  Temp: 98 F (36.7 C)     TempSrc: Oral     SpO2: 92% (!) 78% (!) 88% 91%  Weight: 78.4 kg     Height: 5\' 6"  (1.676 m)      Eyes: PERRL, lids and conjunctivae normal ENMT: Mucous membranes are moist. Posterior pharynx clear of any exudate or lesions.Normal dentition.  Neck: normal, supple, no masses, no thyromegaly Respiratory: clear to auscultation bilaterally, no wheezing, no crackles. Normal respiratory effort. No accessory muscle use.  Cardiovascular: Regular rate and rhythm, no murmurs / rubs / gallops. No extremity edema. 2+ pedal pulses. No carotid bruits.  Abdomen: no tenderness, no masses palpated. No hepatosplenomegaly. Bowel sounds positive.  Musculoskeletal: no clubbing / cyanosis. No joint deformity upper and lower extremities. Good ROM, no contractures. Normal muscle tone.  Skin: no rashes, lesions, ulcers. No induration Neurologic: CN 2-12 grossly intact. Sensation intact, DTR normal. Strength 5/5 in all 4.  Psychiatric: Normal judgment and insight. Alert and oriented x 3. Normal mood.   (Anything < 9 systems with 2 bullets each down codes to level 1) (If patient refuses exam can't bill higher level) (Make sure to document decubitus ulcers present on admission -- if possible -- and whether patient has chronic indwelling catheter at time of admission)  Labs on Admission: I have personally reviewed following labs and imaging  studies  CBC: Recent Labs  Lab 07/09/19 1057  WBC 10.0  HGB 12.5*  HCT 37.7*  MCV 92.9  PLT 427   Basic Metabolic Panel: Recent Labs  Lab 07/09/19 1057  NA 135  K 4.2  CL 102  CO2 18*  GLUCOSE 137*  BUN 41*  CREATININE 2.07*  CALCIUM 9.1   GFR: Estimated Creatinine Clearance: 24.3 mL/min (A) (by C-G formula based on SCr of 2.07 mg/dL (H)). Liver Function Tests: Recent Labs  Lab 07/09/19 1057  AST 22  ALT 24  ALKPHOS 82  BILITOT 1.9*  PROT 7.8  ALBUMIN 4.1   No results for input(s): LIPASE, AMYLASE in the last 168 hours. No results for input(s): AMMONIA in the last 168 hours. Coagulation Profile: Recent Labs  Lab 07/09/19 1113  INR 1.3*   Cardiac Enzymes: No results for input(s): CKTOTAL, CKMB, CKMBINDEX, TROPONINI in the last 168 hours. BNP (last 3 results) No results for input(s): PROBNP in the last 8760 hours. HbA1C: No results for input(s): HGBA1C in the last 72 hours. CBG: Recent Labs  Lab 07/09/19 1524  GLUCAP 141*   Lipid Profile: No results for input(s): CHOL, HDL, LDLCALC, TRIG, CHOLHDL, LDLDIRECT in the last 72 hours. Thyroid Function Tests: No results for input(s): TSH, T4TOTAL, FREET4, T3FREE, THYROIDAB in the last 72 hours. Anemia Panel: No results for input(s): VITAMINB12, FOLATE, FERRITIN, TIBC, IRON, RETICCTPCT in the last 72 hours. Urine analysis:    Component Value Date/Time   COLORURINE YELLOW (A) 03/02/2018 1216   APPEARANCEUR CLEAR (A) 03/02/2018 1216   LABSPEC 1.013 03/02/2018 1216   PHURINE 6.0 03/02/2018 1216   GLUCOSEU NEGATIVE 03/02/2018 1216   HGBUR NEGATIVE 03/02/2018 1216   BILIRUBINUR NEGATIVE 03/02/2018 1216   KETONESUR NEGATIVE 03/02/2018 1216   PROTEINUR NEGATIVE 03/02/2018 1216   NITRITE NEGATIVE 03/02/2018 1216   LEUKOCYTESUR NEGATIVE 03/02/2018 1216   Sepsis Labs: @LABRCNTIP (procalcitonin:4,lacticidven:4) ) Recent Results (from the past 240 hour(s))  Respiratory Panel by RT PCR (Flu A&B, Covid) -  Nasopharyngeal Swab     Status: None   Collection Time: 07/09/19 11:42 AM   Specimen: Nasopharyngeal Swab  Result Value Ref Range Status   SARS Coronavirus 2 by RT PCR NEGATIVE NEGATIVE Final    Comment: (NOTE) SARS-CoV-2 target nucleic acids are NOT DETECTED. The SARS-CoV-2 RNA is generally detectable in upper respiratoy specimens during the acute phase of infection. The lowest concentration of SARS-CoV-2 viral copies this assay can detect is 131 copies/mL. A negative result does not preclude SARS-Cov-2 infection and should not be used as the sole basis for treatment or other patient management decisions. A negative result may occur with  improper specimen collection/handling, submission of specimen other than nasopharyngeal swab, presence of viral mutation(s) within the areas targeted by this assay, and inadequate number of viral copies (<131 copies/mL). A negative result must be combined with clinical observations, patient history, and epidemiological information. The expected result is Negative. Fact Sheet for Patients:  PinkCheek.be Fact Sheet for Healthcare Providers:  GravelBags.it This test is not yet ap proved or cleared by the [place] FDA and  has been authorized for detection and/or diagnosis of SARS-CoV-2 by FDA under an Emergency Use Authorization (EUA). This EUA will remain  in effect (meaning this test can be used) for the duration of the COVID-19 declaration under Section 564(b)(1) of the Act, 21 U.S.C. section 360bbb-3(b)(1), unless the authorization is terminated or revoked sooner.    Influenza A by PCR NEGATIVE NEGATIVE Final   Influenza B by PCR NEGATIVE NEGATIVE Final    Comment: (NOTE) The Xpert Xpress SARS-CoV-2/FLU/RSV assay is intended as an aid in  the diagnosis of influenza from Nasopharyngeal swab specimens and  should not be used as a sole basis for treatment. Nasal washings and  aspirates  are unacceptable for Xpert Xpress SARS-CoV-2/FLU/RSV  testing. Fact Sheet for Patients: PinkCheek.be Fact Sheet for Healthcare Providers: GravelBags.it This test is not yet approved or cleared by the [place] FDA and  has been authorized for detection and/or diagnosis of SARS-CoV-2 by  FDA under an Emergency Use Authorization (EUA). This EUA will remain  in effect (meaning this test can be used) for the duration of the  Covid-19 declaration under Section 564(b)(1) of the Act, 21  U.S.C. section 360bbb-3(b)(1), unless the authorization is  terminated or revoked. Performed at [facility], [address] [place], [place] [address]   MRSA PCR Screening     Status: None   Collection Time: 07/09/19  3:29 PM   Specimen: Nasopharyngeal  Result Value Ref Range Status   MRSA by PCR NEGATIVE NEGATIVE Final    Comment:        The GeneXpert MRSA Assay (FDA approved for NASAL specimens only), is one component of a comprehensive MRSA colonization surveillance program. It is not intended to diagnose MRSA infection nor to guide or monitor treatment for MRSA infections. Performed at [facility], [address] [place], [place] [address]      Radiological Exams on Admission: DG Chest [facility] 1 View  Result Date: 07/09/2019 CLINICAL DATA:  Chest pain. EXAM: PORTABLE CHEST 1 VIEW COMPARISON:  August 23, 2010. FINDINGS: Stable cardiomegaly. Status post coronary bypass graft. Interstitial densities are noted throughout both lungs most consistent with pulmonary edema. No pneumothorax or pleural effusion is noted. Bony thorax is unremarkable. IMPRESSION: Findings consistent with bilateral pulmonary edema. Electronically Signed   By: Marijo Conception M.D.   On: 07/09/2019 11:35    EKG: sinus tach 109 / TWI in III/V1/V2/V3/ ST depression in leads V4/5/6.  Assessment/Plan Coronary artery disease involving native coronary  artery of native heart with unstable angina pectoris / NSTEMI [facility]) Assessment & Plan Pt is presenting with unstable angina that has been going on for past few weeks with worsening DOE and chest pain and pressure starting today am with abnormal ekg and troponin that is elevated. Cardiology was consulted in ed and plan is for pt to cont heparin  Drip in addition to statin and asa.  Most Recent 11/23/2018 12/02/2017 03/05/2017 02/27/2016            Lipid Profile   Total CHOL/HDL Ratio 2.7 (11/23/2018) 2.7 3.1 3.6 3.0   Cholesterol 167 (03/05/2017)   167    Cholesterol, Total 134 (11/23/2018) 134 123  147   HDL Cholesterol 49 (11/23/2018) 49 40 46 49   LDL (calc) 69 (11/23/2018) 69 64  83   Triglycerides 78 (11/23/2018) 78 94 216 76    Received report about pt declining with hypoxia and worsening chest pain , per intensivist Dr. Lanney Gins and we have requested pulmonary critical care to further stabilize and manage pt with cardiology. We will follow post cath.   Benign essential HTN Assessment & Plan Home regimen of amlodipine and valsartan hctz is held due to soft bp and to allow room for diuresis if needed.  Echo is pending. Also pt has AKI on CKD therefor valsartan is held restart with caution and creatinine labs .   Acid reflux Assessment & Plan I V PPI.   Borderline diabetes Assessment & Plan We will assess pt for diabetes.   Glaucoma Assessment & Plan We will continue pt on his home eye drops.   Facility-Administered Medications   dorzolamide-timolol (COSOPT) 22.3-6.8 MG/ML ophthalmic solution 1 drop     1 drop,  Both Eyes, BID  07/09/2019    latanoprost (XALATAN) 0.005 % ophthalmic solution 1 drop     1 drop, Both Eyes, QHS  07/09/2019    polyvinyl alcohol (LIQUIFILM TEARS) 1.4 % ophthalmic solution     Right Eye, Daily PRN for blurriness  07/09/2019      DVT prophylaxis: Heparin drip  Code Status: Full Family Communication: None at bedside  Disposition Plan: To be determined.   Consults called: Cardiology Dr.Fath . Admission status: Inpatient .  Para Skeans MD [facility] If 7PM-7AM, please contact night-coverage [email or web address] Password [facility]  07/09/2019, 5:17 PM

## 2019-07-09 NOTE — Progress Notes (Signed)
[place] for heparin Indication: chest pain/ACS  Allergies  Allergen Reactions  . Penicillins Itching, Swelling, Rash and Other (See Comments)    Has patient had a PCN reaction causing immediate rash, facial/tongue/throat swelling, SOB or lightheadedness with hypotension: Yes Has patient had a PCN reaction causing severe rash involving mucus membranes or skin necrosis: No Has patient had a PCN reaction that required hospitalization No Has patient had a PCN reaction occurring within the last 10 years: No If all of the above answers are "NO", then may proceed with Cephalosporin use.     Patient Measurements: Height: 5\' 6"  (167.6 cm) Weight: 170 lb (77.1 kg) IBW/kg (Calculated) : 63.8 Heparin Dosing Weight: 77 kg  Vital Signs: Temp: 98.6 F (37 C) (02/06 1054) Temp Source: Oral (02/06 1054) BP: 138/80 (02/06 1230) Pulse Rate: 114 (02/06 1230)  Labs: Recent Labs    07/09/19 1057 07/09/19 1113  HGB 12.5*  --   HCT 37.7*  --   PLT 164  --   APTT  --  46*  LABPROT  --  15.7*  INR  --  1.3*  CREATININE 2.07*  --   TROPONINIHS 41*  --     Estimated Creatinine Clearance: 24.1 mL/min (A) (by C-G formula based on SCr of 2.07 mg/dL (H)).   Medical History: Past Medical History:  Diagnosis Date  . Absolute anemia 12/08/2014  . Allergy   . Anginal pain ([place])    much less angina d/t lack of activity  . Arthritis    all over body  . Bradycardia 05/03/2015  . Calculus of kidney 12/08/2014  . Coronary artery disease   . GERD (gastroesophageal reflux disease)   . Glaucoma   . History of kidney stones   . HLD (hyperlipidemia)   . Hyperglycemia 08/27/2015  . Hypertension   . Myocardial infarction ([place]) 2011  . Peripheral vascular disease ([place])   . VHD (valvular heart disease)     Assessment: 86 year old male presented with chest pain. No known prior anticoagulation. Patient to start on heparin drip. Pharmacy consulted.  Goal of  Therapy:  Heparin level 0.3-0.7 units/ml Monitor platelets by anticoagulation protocol: Yes   Plan:  Heparin 4000 unit bolus followed by heparin drip at 900 units/hr. HL at 2000. CBC daily while on heparin drip.  Tawnya Crook, PharmD 07/09/2019,1:17 PM

## 2019-07-09 NOTE — Assessment & Plan Note (Addendum)
Home regimen of amlodipine and valsartan hctz is held due to soft bp and to allow room for diuresis if needed.  Echo is pending. Also pt has AKI on CKD therefor valsartan is held restart with caution and creatinine labs .

## 2019-07-09 NOTE — Assessment & Plan Note (Signed)
We will assess pt for diabetes.

## 2019-07-09 NOTE — Consult Note (Signed)
CRITICAL CARE PROGRESS NOTE    Name: LINLEY MOXLEY MRN: [id number] DOB: [birth year]     LOS: 0   SUBJECTIVE FINDINGS & SIGNIFICANT EVENTS   Patient description:  86 year old male with history of severe CAD status post CABG, essential hypertension, chronic anemia and came into the hospital for severe chest pain.  He also states 3 days of worsening dyspnea.  Was admitted by hospitalist service to stepdown for possible non-STEMI and cardiology consultation.  When patient came into stepdown unit he started complaining of worsening chest pain and noted to have severe desaturation with cyanosis on clinical exam.  Critical care consultation was placed due to patient clinically declining with hypoxemia and worsening chest pain    Lines / Drains: Peripheral IV x2  Cultures / Sepsis markers: Cultures x2  Antibiotics: None   Protocols / Consultants: Cardiology PCCM hospitalist  Tests / Events: Transthoracic echo    PAST MEDICAL HISTORY   Past Medical History:  Diagnosis Date  . Absolute anemia 12/08/2014  . Allergy   . Anginal pain ([place])    much less angina d/t lack of activity  . Arthritis    all over body  . Bradycardia 05/03/2015  . Calculus of kidney 12/08/2014  . Coronary artery disease   . GERD (gastroesophageal reflux disease)   . Glaucoma   . History of kidney stones   . HLD (hyperlipidemia)   . Hyperglycemia 08/27/2015  . Hypertension   . Myocardial infarction ([place]) 2011  . Peripheral vascular disease ([place])   . VHD (valvular heart disease)      SURGICAL HISTORY   Past Surgical History:  Procedure Laterality Date  . ANTERIOR CERVICAL DECOMP/DISCECTOMY FUSION N/A 12/30/2017   Procedure: ANTERIOR CERVICAL DECOMPRESSION/DISCECTOMY FUSION 3 LEVELS- C3-6;  Surgeon: Meade Maw, MD;   Location: [facility] OR[facility];  Service: Neurosurgery;  Laterality: N/A;  . CARDIAC CATHETERIZATION  2007  . CARDIAC CATHETERIZATION N/A 04/17/2015   Procedure: Left Heart Cath and Coronary Angiography;  Surgeon: Corey Skains, MD;  Location: [place] [facility];  Service: Cardiovascular;  Laterality: N/A;  . CARPAL TUNNEL RELEASE Bilateral    right 06/09/08 left 05/03/09  . CATARACT EXTRACTION W/ INTRAOCULAR LENS  IMPLANT, BILATERAL    . COLONOSCOPY N/A 10/02/2014   Procedure: COLONOSCOPY;  Surgeon: Manya Silvas, MD;  Location: [facility] ENDOSCOPY;  Service: Endoscopy;  Laterality: N/A;  . CORONARY ARTERY BYPASS GRAFT  2007  . ESOPHAGOGASTRODUODENOSCOPY    . EYE SURGERY    . HEMORROIDECTOMY    . JOINT REPLACEMENT Right 03/2017   partial knee replacement  . KNEE ARTHROSCOPY Right 08/05/2016   Procedure: ARTHROSCOPY KNEE;  Surgeon: Thornton Park, MD;  Location: [facility] ORS;  Service: Orthopedics;  Laterality: Right;  . KNEE ARTHROSCOPY WITH LATERAL RELEASE Right 08/05/2016   Procedure: KNEE ARTHROSCOPY WITH LATERAL RELEASE;  Surgeon: Thornton Park, MD;  Location: [facility];  Service: Orthopedics;  Laterality: Right;  . KNEE ARTHROSCOPY WITH MEDIAL MENISECTOMY Right 08/05/2016   Procedure: KNEE ARTHROSCOPY WITH MEDIAL MENISECTOMY;  Surgeon: Thornton Park, MD;  Location: [facility];  Service: Orthopedics;  Laterality: Right;  Partial  . LUMBAR EPIDURAL INJECTION  2011  . LUMBAR LAMINECTOMY/DECOMPRESSION MICRODISCECTOMY N/A 03/10/2018   Procedure: LUMBAR LAMINECTOMY/DECOMPRESSION MICRODISCECTOMY 3 LEVELS-L2-5;  Surgeon: Meade Maw, MD;  Location: [facility] OR[facility];  Service: Neurosurgery;  Laterality: N/A;  . MOUTH SURGERY     tooth implant  . SHOULDER SURGERY Right 2011   rotator cuff repair  . SYNOVECTOMY Right 08/05/2016   Procedure:  SYNOVECTOMY;  Surgeon: Thornton Park, MD;  Location: [facility] ORS;  Service: Orthopedics;  Laterality: Right;  arthroscopic extensive synovectomy  . TONSILLECTOMY       FAMILY  HISTORY   Family History  Problem Relation Age of Onset  . Heart disease Mother   . Heart disease Father   . Heart disease Brother   . Heart disease Brother   . Heart disease Brother   . Heart disease Brother   . Heart disease Brother      SOCIAL HISTORY   Social History   Tobacco Use  . Smoking status: Former Smoker    Years: 15.00    Types: Cigarettes    Quit date: 1990    Years since quitting: 31.1  . Smokeless tobacco: Never Used  Substance Use Topics  . Alcohol use: Yes    Alcohol/week: 7.0 - 14.0 standard drinks    Types: 7 - 14 Glasses of wine per week    Comment: 1-2 glasses of wine a night  . Drug use: No     MEDICATIONS   Current Medication:  Current Facility-Administered Medications:  .  acetaminophen (TYLENOL) tablet 650 mg, 650 mg, Oral, Q4H PRN, Para Skeans, MD .  Derrill Memo ON 07/10/2019] aspirin EC tablet 81 mg, 81 mg, Oral, Daily, Florina Ou V, MD .  atorvastatin (LIPITOR) tablet 80 mg, 80 mg, Oral, q1800, Florina Ou V, MD .  dorzolamide-timolol (COSOPT) 22.3-6.8 MG/ML ophthalmic solution 1 drop, 1 drop, Both Eyes, BID, Patel, Ekta V, MD .  furosemide (LASIX) injection 40 mg, 40 mg, Intravenous, Q6H, Fath, Kenneth A, MD .  heparin ADULT infusion 100 units/mL (25000 units/260mL sodium chloride 0.45%), 900 Units/hr, Intravenous, Continuous, Earleen Newport, MD, Last Rate: 9 mL/hr at 07/09/19 1140, 900 Units/hr at 07/09/19 1140 .  latanoprost (XALATAN) 0.005 % ophthalmic solution 1 drop, 1 drop, Both Eyes, QHS, Patel, Ekta V, MD .  Melatonin TABS 5 mg, 5 mg, Oral, QHS PRN, Para Skeans, MD .  metoprolol tartrate (LOPRESSOR) tablet 12.5 mg, 12.5 mg, Oral, BID, Fath, Javier Docker, MD .  morphine 2 MG/ML injection 2 mg, 2 mg, Intravenous, Q2H PRN, Teodoro Spray, MD .  morphine 2 MG/ML injection, , , ,  .  multivitamin-lutein (OCUVITE-LUTEIN) capsule 1 capsule, 1 capsule, Oral, BID, Florina Ou V, MD .  nitroGLYCERIN (NITROSTAT) SL tablet 0.4 mg, 0.4  mg, Sublingual, Q5 min PRN, Earleen Newport, MD, 0.4 mg at 07/09/19 1147 .  nitroGLYCERIN 50 mg in dextrose 5 % 250 mL (0.2 mg/mL) infusion, 0-200 mcg/min, Intravenous, Titrated, Fath, Javier Docker, MD .  omega-3 acid ethyl esters (LOVAZA) capsule 1 g, 1 g, Oral, BID, Posey Pronto, Ekta V, MD .  ondansetron (ZOFRAN) injection 4 mg, 4 mg, Intravenous, Q6H PRN, Florina Ou V, MD .  pantoprazole (PROTONIX) injection 40 mg, 40 mg, Intravenous, Q12H, Patel, Ekta V, MD .  polyvinyl alcohol (LIQUIFILM TEARS) 1.4 % ophthalmic solution, , Right Eye, Daily PRN, Florina Ou V, MD .  tamsulosin (FLOMAX) capsule 0.4 mg, 0.4 mg, Oral, Daily, Posey Pronto, Gretta Cool, MD    ALLERGIES   Penicillins    REVIEW OF SYSTEMS     10 point ROS conducted and is negative except for chest pain shortness of breath  PHYSICAL EXAMINATION   Vital Signs: Temp:  [98 F (36.7 C)-98.6 F (37 C)] 98 F (36.7 C) (02/06 1523) Pulse Rate:  [77-118] 118 (02/06 1713) Resp:  [13-30] 13 (02/06 1713) BP: (109-158)/(63-93) 146/93 (02/06 1700) SpO2:  [  78 %-100 %] 92 % (02/06 1713) Weight:  [77.1 kg-78.4 kg] 78.4 kg (02/06 1523)  GENERAL: Age-appropriate well-nourished HEAD: Normocephalic, atraumatic.  EYES: Pupils equal, round, reactive to light.  No scleral icterus.  MOUTH: Moist mucosal membrane. NECK: Supple. No thyromegaly. No nodules. No JVD.  PULMONARY: Bilateral crackles worse at the bases with decreased breath sounds bilaterally CARDIOVASCULAR: S1 and S2. Regular rate and rhythm. No murmurs, rubs, or gallops.  GASTROINTESTINAL: Soft, nontender, non-distended. No masses. Positive bowel sounds. No hepatosplenomegaly.  MUSCULOSKELETAL: No swelling, clubbing, or edema.  NEUROLOGIC: Mild distress due to acute illness SKIN:intact,warm,dry   PERTINENT DATA     Infusions: . heparin 900 Units/hr (07/09/19 1140)  . nitroGLYCERIN     Scheduled Medications: . [START ON 07/10/2019] aspirin EC  81 mg Oral Daily  . atorvastatin   80 mg Oral q1800  . dorzolamide-timolol  1 drop Both Eyes BID  . furosemide  40 mg Intravenous Q6H  . latanoprost  1 drop Both Eyes QHS  . metoprolol tartrate  12.5 mg Oral BID  . morphine      . multivitamin-lutein  1 capsule Oral BID  . omega-3 acid ethyl esters  1 g Oral BID  . pantoprazole (PROTONIX) IV  40 mg Intravenous Q12H  . tamsulosin  0.4 mg Oral Daily   PRN Medications: acetaminophen, Melatonin, morphine injection, nitroGLYCERIN, ondansetron (ZOFRAN) IV, polyvinyl alcohol Hemodynamic parameters:   Intake/Output: No intake/output data recorded.  Ventilator  Settings:     LAB RESULTS:  Basic Metabolic Panel: Recent Labs  Lab 07/09/19 1057  NA 135  K 4.2  CL 102  CO2 18*  GLUCOSE 137*  BUN 41*  CREATININE 2.07*  CALCIUM 9.1   Liver Function Tests: Recent Labs  Lab 07/09/19 1057  AST 22  ALT 24  ALKPHOS 82  BILITOT 1.9*  PROT 7.8  ALBUMIN 4.1   No results for input(s): LIPASE, AMYLASE in the last 168 hours. No results for input(s): AMMONIA in the last 168 hours. CBC: Recent Labs  Lab 07/09/19 1057  WBC 10.0  HGB 12.5*  HCT 37.7*  MCV 92.9  PLT 164   Cardiac Enzymes: No results for input(s): CKTOTAL, CKMB, CKMBINDEX, TROPONINI in the last 168 hours. BNP: Invalid input(s): POCBNP CBG: Recent Labs  Lab 07/09/19 1524  GLUCAP 141*     IMAGING RESULTS:  Imaging: DG Chest Port 1 View  Result Date: 07/09/2019 CLINICAL DATA:  Chest pain EXAM: PORTABLE CHEST 1 VIEW COMPARISON:  07/09/2019 FINDINGS: Bilateral airspace disease again noted, worsening on the right since prior study with slight improvement on the left. Cardiomegaly. Suspect small right pleural effusion. No acute bony abnormality. IMPRESSION: Bilateral airspace disease, improving on the left and worsening on the right. This could reflect asymmetric edema or infection. Small right effusion. Electronically Signed   By: Rolm Baptise M.D.   On: 07/09/2019 17:15   DG Chest Port 1  View  Result Date: 07/09/2019 CLINICAL DATA:  Chest pain. EXAM: PORTABLE CHEST 1 VIEW COMPARISON:  August 23, 2010. FINDINGS: Stable cardiomegaly. Status post coronary bypass graft. Interstitial densities are noted throughout both lungs most consistent with pulmonary edema. No pneumothorax or pleural effusion is noted. Bony thorax is unremarkable. IMPRESSION: Findings consistent with bilateral pulmonary edema. Electronically Signed   By: Marijo Conception M.D.   On: 07/09/2019 11:35       ASSESSMENT AND PLAN    -Multidisciplinary rounds held today  Acute Hypoxic Respiratory Failure -due to florid pulmonary edema secondary  to acute on chronic CHF as well as renal insufficiency -TTE in process - BIPAP support to unload LV and improve pulmonary mechanics -continue Bronchodilator Therapy -Morphine 2 mg every 6 hours as needed for severe chest pain   Non-ST elevation MI -History of severe CAD status post CABG -Cardiology on case-appreciate input -Status post heparin PPx dose/aspirin/nitroglycerin/morphine/metoprolol -oxygen as needed -Lasix 40 mg every 6 hours -follow up cardiac biomarkers ICU telemetry monitoring  -Chest pain oxygenation has improved  Renal Failure-acute on chronic stage II -follow chem 7 -follow UO -continue Foley Catheter-assess need daily -See nonessential nephrotoxins   Septic shock -use vasopressors to keep MAP>65 -follow ABG and LA -follow up cultures -emperic ABX -consider stress dose steroids   ID -continue IV abx as prescibed -follow up cultures  GI/Nutrition GI PROPHYLAXIS as indicated DIET-->TF's as tolerated Constipation protocol as indicated  ENDO - ICU hypoglycemic\Hyperglycemia protocol -check FSBS per protocol   ELECTROLYTES -follow labs as needed -replace as needed -pharmacy consultation   DVT/GI PRX ordered -SCDs  TRANSFUSIONS AS NEEDED MONITOR FSBS ASSESS the need for LABS as needed   Critical care provider statement:     Critical care time (minutes):  109   Critical care time was exclusive of:  Separately billable procedures and treating other patients   Critical care was necessary to treat or prevent imminent or life-threatening deterioration of the following conditions:   Acute hypoxemic respiratory failure, acute chest pain, non-STEMI, florid pulmonary edema, status post CABG multiple comorbid conditions   Critical care was time spent personally by me on the following activities:  Development of treatment plan with patient or surrogate, discussions with consultants, evaluation of patient's response to treatment, examination of patient, obtaining history from patient or surrogate, ordering and performing treatments and interventions, ordering and review of laboratory studies and re-evaluation of patient's condition.  I assumed direction of critical care for this patient from another provider in my specialty: no    This document was prepared using Dragon voice recognition software and may include unintentional dictation errors.    Ottie Glazier, M.D.  Division of [place]

## 2019-07-09 NOTE — ED Triage Notes (Signed)
Pt to triage states had an episode of CP around 3 AM

## 2019-07-09 NOTE — ED Triage Notes (Signed)
Patient c/o chest pain that started around 0300. Patient took nitro and reports it took pain completely away and then started having pain again around 0900. Reports pressure/achy with radiation to left neck

## 2019-07-09 NOTE — Consult Note (Signed)
Cardiology Consultation Note    Patient ID: Austin [name], MRN: [id number], DOB/AGE: [birth year] 86 y.o. Admit date: 07/09/2019   Date of Consult: 07/09/2019 Primary Physician: Jerrol Banana., MD Primary Cardiologist: Dr. Nehemiah Massed  Chief Complaint: chest pain Reason for Consultation: chest pain Requesting MD: Dr. Posey Pronto  HPI: Austin [name] is a 86 y.o. male with history of  CAD s/p CABG x 3 in 2007 with LIMA to LAD, SVG to OM1, and PDA (cath from 2016 with 3/3 functional grafts), mild aortic stenosis, HTN, HLD, and carotid artery stenosis (40-59% to bilateral ICAs), followed by [name], who presents emergency room today with chest pain that started approximately 3 AM today.  He describes nausea, diaphoresis and shortness of breath.  Pain was 7 on 10 on presentation which improved with sublingual nitroglycerin.  Stated his pain was similar to his angina.  EKG in the emergency room revealed ST depression in the lateral leads.  Initial troponin was 41 with a subsequent value of 51.  He was started on heparin.  BNP was 304.  Creatinine was 2.07 with a baseline of approximately 1.5.  Patient initially was stable doing fairly well although in the last period of time developed increasing shortness of breath and chest pressure with desaturation.  He has been treated with morphine and is receiving IV Lasix and oxygen therapy.  His pain persist.  Stat EKG shows ST depression in the lateral leads.  Chest x-ray is pending.  Echo is pending.     Past Medical History:  Diagnosis Date  . Absolute anemia 12/08/2014  . Allergy   . Anginal pain ([place])    much less angina d/t lack of activity  . Arthritis    all over body  . Bradycardia 05/03/2015  . Calculus of kidney 12/08/2014  . Coronary artery disease   . GERD (gastroesophageal reflux disease)   . Glaucoma   . History of kidney stones   . HLD (hyperlipidemia)   . Hyperglycemia 08/27/2015  . Hypertension   . Myocardial infarction ([place]) 2011  .  Peripheral vascular disease ([place])   . VHD (valvular heart disease)       Surgical History:  Past Surgical History:  Procedure Laterality Date  . ANTERIOR CERVICAL DECOMP/DISCECTOMY FUSION N/A 12/30/2017   Procedure: ANTERIOR CERVICAL DECOMPRESSION/DISCECTOMY FUSION 3 LEVELS- C3-6;  Surgeon: Meade Maw, MD;  Location: [facility] OR[facility];  Service: Neurosurgery;  Laterality: N/A;  . CARDIAC CATHETERIZATION  2007  . CARDIAC CATHETERIZATION N/A 04/17/2015   Procedure: Left Heart Cath and Coronary Angiography;  Surgeon: Corey Skains, MD;  Location: [facility] [facility];  Service: Cardiovascular;  Laterality: N/A;  . CARPAL TUNNEL RELEASE Bilateral    right 06/09/08 left 05/03/09  . CATARACT EXTRACTION W/ INTRAOCULAR LENS  IMPLANT, BILATERAL    . COLONOSCOPY N/A 10/02/2014   Procedure: COLONOSCOPY;  Surgeon: Manya Silvas, MD;  Location: [facility];  Service: Endoscopy;  Laterality: N/A;  . CORONARY ARTERY BYPASS GRAFT  2007  . ESOPHAGOGASTRODUODENOSCOPY    . EYE SURGERY    . HEMORROIDECTOMY    . JOINT REPLACEMENT Right 03/2017   partial knee replacement  . KNEE ARTHROSCOPY Right 08/05/2016   Procedure: ARTHROSCOPY KNEE;  Surgeon: Thornton Park, MD;  Location: [facility];  Service: Orthopedics;  Laterality: Right;  . KNEE ARTHROSCOPY WITH LATERAL RELEASE Right 08/05/2016   Procedure: KNEE ARTHROSCOPY WITH LATERAL RELEASE;  Surgeon: Thornton Park, MD;  Location: [facility];  Service: Orthopedics;  Laterality: Right;  .  KNEE ARTHROSCOPY WITH MEDIAL MENISECTOMY Right 08/05/2016   Procedure: KNEE ARTHROSCOPY WITH MEDIAL MENISECTOMY;  Surgeon: Thornton Park, MD;  Location: [facility];  Service: Orthopedics;  Laterality: Right;  Partial  . LUMBAR EPIDURAL INJECTION  2011  . LUMBAR LAMINECTOMY/DECOMPRESSION MICRODISCECTOMY N/A 03/10/2018   Procedure: LUMBAR LAMINECTOMY/DECOMPRESSION MICRODISCECTOMY 3 LEVELS-L2-5;  Surgeon: Meade Maw, MD;  Location: [facility];  Service: Neurosurgery;   Laterality: N/A;  . MOUTH SURGERY     tooth implant  . SHOULDER SURGERY Right 2011   rotator cuff repair  . SYNOVECTOMY Right 08/05/2016   Procedure: SYNOVECTOMY;  Surgeon: Thornton Park, MD;  Location: [facility];  Service: Orthopedics;  Laterality: Right;  arthroscopic extensive synovectomy  . TONSILLECTOMY       Home Meds: Prior to Admission medications   Medication Sig Start Date End Date Taking? Authorizing Provider  amLODipine (NORVASC) 5 MG tablet Take 5 mg by mouth daily.   Yes [provider]  atorvastatin (LIPITOR) 20 MG tablet Take 20 mg by mouth daily.  04/24/14  Yes [provider]  Biotin 5000 MCG CAPS Take 5,000 mcg by mouth daily.    Yes [provider]  Cholecalciferol (VITAMIN D3) 2000 UNITS capsule Take 2,000 Units by mouth 2 (two) times daily.    Yes [provider]  Coenzyme Q10 (COQ-10) 200 MG CAPS Take 200 mg by mouth daily.   Yes [provider]  dorzolamide-timolol (COSOPT) 22.3-6.8 MG/ML ophthalmic solution Place 1 drop into both eyes 2 (two) times daily.  07/03/15  Yes [provider]  isosorbide mononitrate (IMDUR) 60 MG 24 hr tablet Take 60 mg by mouth daily.  06/09/16  Yes [provider]  Melatonin 5 MG CAPS Take 5 mg by mouth at bedtime as needed (for sleep).    Yes [provider]  Multiple Vitamins-Minerals (PRESERVISION AREDS 2) CAPS Take 1 capsule by mouth 2 (two) times daily.   Yes [provider]  nitroGLYCERIN (NITROSTAT) 0.4 MG SL tablet Place 0.4 mg under the tongue every 5 (five) minutes as needed for chest pain.  02/25/11  Yes [provider]  Omega-3 Fatty Acids (FISH OIL) 1200 MG CAPS Take 1,200 mg by mouth daily.    Yes [provider]  omeprazole (PRILOSEC) 20 MG capsule Take 20 mg by mouth daily.  07/28/15  Yes [provider]  tamsulosin (FLOMAX) 0.4 MG CAPS capsule TAKE 1 CAPSULE BY MOUTH EVERY DAY Patient taking differently: Take 0.4 mg by  mouth daily.  11/10/18  Yes Jerrol Banana., MD  Travoprost, BAK Free, (TRAVATAN Z) 0.004 % SOLN ophthalmic solution Place 1 drop into both eyes at bedtime.  08/12/11  Yes [provider]  Turmeric 500 MG TABS Take 1,000 mg by mouth daily.   Yes [provider]  valsartan-hydrochlorothiazide (DIOVAN HCT) 320-25 MG per tablet Take 1 tablet by mouth daily.    Yes [provider]  vitamin B-12 (CYANOCOBALAMIN) 1000 MCG tablet Take 1,000 mcg by mouth daily.   Yes [provider]  Glucosamine HCl (GLUCOSAMINE PO) Take 1 tablet by mouth 2 (two) times daily.    [provider]  Liniments (SALONPAS PAIN RELIEF PATCH EX) Apply 1 patch topically daily as needed (for pain).    [provider]  Polyethyl Glycol-Propyl Glycol (SYSTANE OP) Place 1 drop into the right eye daily as needed (for blurriness).     [provider]    Inpatient Medications:  . [START ON 07/10/2019] aspirin EC  81  mg Oral Daily  . atorvastatin  80 mg Oral q1800  . dorzolamide-timolol  1 drop Both Eyes BID  . latanoprost  1 drop Both Eyes QHS  .  morphine injection  2 mg Intravenous STAT  . morphine      . multivitamin-lutein  1 capsule Oral BID  . omega-3 acid ethyl esters  1 g Oral BID  . pantoprazole (PROTONIX) IV  40 mg Intravenous Q12H  . tamsulosin  0.4 mg Oral Daily   . heparin 900 Units/hr (07/09/19 1140)    Allergies:  Allergies  Allergen Reactions  . Penicillins Itching, Swelling, Rash and Other (See Comments)    Has patient had a PCN reaction causing immediate rash, facial/tongue/throat swelling, SOB or lightheadedness with hypotension: Yes Has patient had a PCN reaction causing severe rash involving mucus membranes or skin necrosis: No Has patient had a PCN reaction that required hospitalization No Has patient had a PCN reaction occurring within the last 10 years: No If all of the above answers are "NO", then may proceed with Cephalosporin use.      Social History   Socioeconomic History  . Marital status: Married    Spouse name: Blanch Media  . Number of children: 1  . Years of education: Not on file  . Highest education level: Some college, no degree  Occupational History  . Occupation: Engineer, water: RETIRED  Tobacco Use  . Smoking status: Former Smoker    Years: 15.00    Types: Cigarettes    Quit date: 1990    Years since quitting: 31.1  . Smokeless tobacco: Never Used  Substance and Sexual Activity  . Alcohol use: Yes    Alcohol/week: 7.0 - 14.0 standard drinks    Types: 7 - 14 Glasses of wine per week    Comment: 1-2 glasses of wine a night  . Drug use: No  . Sexual activity: Not Currently  Other Topics Concern  . Not on file  Social History Narrative  . Not on file   Social Determinants of Health   Financial Resource Strain: Low Risk   . Difficulty of Paying Living Expenses: Not hard at all  Food Insecurity: No Food Insecurity  . Worried About Charity fundraiser in the Last Year: Never true  . Ran Out of Food in the Last Year: Never true  Transportation Needs: No Transportation Needs  . Lack of Transportation (Medical): No  . Lack of Transportation (Non-Medical): No  Physical Activity: Inactive  . Days of Exercise per Week: 0 days  . Minutes of Exercise per Session: 0 min  Stress: No Stress Concern Present  . Feeling of Stress : Not at all  Social Connections: Unknown  . Frequency of Communication with Friends and Family: Patient refused  . Frequency of Social Gatherings with Friends and Family: Patient refused  . Attends Religious Services: Patient refused  . Active Member of Clubs or Organizations: Patient refused  . Attends Archivist Meetings: Patient refused  . Marital Status: Patient refused  Intimate Partner Violence: Unknown  . Fear of Current or Ex-Partner: Patient refused  . Emotionally Abused: Patient refused  . Physically Abused: Patient refused  .  Sexually Abused: Patient refused     Family History  Problem Relation Age of Onset  . Heart disease Mother   . Heart disease Father   . Heart disease Brother   . Heart disease Brother   . Heart disease Brother   .  Heart disease Brother   . Heart disease Brother      Review of Systems: A 12-system review of systems was performed and is negative except as noted in the HPI.  Labs: No results for input(s): CKTOTAL, CKMB, TROPONINI in the last 72 hours. Lab Results  Component Value Date   WBC 10.0 07/09/2019   HGB 12.5 (L) 07/09/2019   HCT 37.7 (L) 07/09/2019   MCV 92.9 07/09/2019   PLT 164 07/09/2019    Recent Labs  Lab 07/09/19 1057  NA 135  K 4.2  CL 102  CO2 18*  BUN 41*  CREATININE 2.07*  CALCIUM 9.1  PROT 7.8  BILITOT 1.9*  ALKPHOS 82  ALT 24  AST 22  GLUCOSE 137*   Lab Results  Component Value Date   CHOL 134 11/23/2018   HDL 49 11/23/2018   LDLCALC 69 11/23/2018   TRIG 78 11/23/2018   No results found for: DDIMER  Radiology/Studies:  [facility] Chest Port 1 View  Result Date: 07/09/2019 CLINICAL DATA:  Chest pain. EXAM: PORTABLE CHEST 1 VIEW COMPARISON:  August 23, 2010. FINDINGS: Stable cardiomegaly. Status post coronary bypass graft. Interstitial densities are noted throughout both lungs most consistent with pulmonary edema. No pneumothorax or pleural effusion is noted. Bony thorax is unremarkable. IMPRESSION: Findings consistent with bilateral pulmonary edema. Electronically Signed   By: Marijo Conception M.D.   On: 07/09/2019 11:35    Wt Readings from Last 3 Encounters:  07/09/19 78.4 kg  03/28/19 77.1 kg  01/03/19 78 kg    EKG: Sinus rhythm with ST depression in the lateral leads  Physical Exam: Acutely short of breath Caucasian male complaining of chest tightness. Blood pressure (!) 154/87, pulse 77, temperature 98 F (36.7 C), temperature source Oral, resp. rate 20, height 5\' 6"  (1.676 m), weight 78.4 kg, SpO2 92 %. Body mass index is 27.9  kg/m. General: Well developed, well nourished, in no acute distress. Head: Normocephalic, atraumatic, sclera non-icteric, no xanthomas, nares are without discharge.  Neck: Negative for carotid bruits. JVD not elevated. Lungs: Crackles bilaterally. Heart: Sinus tachycardia.  ST depression in the lateral leads. Abdomen: Soft, non-tender, non-distended with normoactive bowel sounds. No hepatomegaly. No rebound/guarding. No obvious abdominal masses. Msk:  Strength and tone appear normal for age. Extremities: No clubbing or cyanosis. No edema.  Distal pedal pulses are 2+ and equal bilaterally. Neuro: Alert and oriented X 3. No facial asymmetry. No focal deficit. Moves all extremities spontaneously. Psych:  Responds to questions appropriately with a normal affect.     Assessment and Plan   86 y.o. male with history of  CAD s/p CABG x 3 in 2007 with LIMA to LAD, SVG to OM1, and PDA (cath from 2016 with 3/3 functional grafts), mild aortic stenosis, HTN, HLD, and carotid artery stenosis (40-59% to bilateral ICAs), followed by [name] who presented to the ER with chest pain starting at 3 AM this morning.  EKG showed ST depression in the lateral leads which was somewhat more normal than his baseline.  Initial troponin was 41 followed by 51.  Was placed on heparin.  Developed acute shortness of breath with hypoxia and chest pain.  ST depression deep and somewhat in the lateral leads.  Chest x-ray is pending.  Received IV Lasix.  Will treat with IV morphine and consider urgent cardiac cath if ischemia worsens.  Signed, Teodoro Spray MD 07/09/2019, 4:48 PM Pager: [phone]

## 2019-07-09 NOTE — ED Provider Notes (Signed)
[facility] Emergency Department Provider Note       Time seen: ----------------------------------------- 10:50 AM on 07/09/2019 -----------------------------------------   I have reviewed the triage vital signs and the nursing notes.  HISTORY   Chief Complaint Chest Pain    HPI Austin [name] is a 86 y.o. male with a history of anemia, angina, coronary artery disease, GERD, kidney stones, hyperglycemia, hypertension, peripheral vascular disease who presents to the ED for chest pain that started around 3 AM.  Patient does describe nausea, sweating and shortness of breath.  Pain is currently 7 out of 10.  He has had 2 doses of nitroglycerin one that helped, and the other that did not.  Pain is similar to prior MI.  Past Medical History:  Diagnosis Date  . Absolute anemia 12/08/2014  . Allergy   . Anginal pain ([place])    much less angina d/t lack of activity  . Arthritis    all over body  . Bradycardia 05/03/2015  . Calculus of kidney 12/08/2014  . Coronary artery disease   . GERD (gastroesophageal reflux disease)   . Glaucoma   . History of kidney stones   . HLD (hyperlipidemia)   . Hyperglycemia 08/27/2015  . Hypertension   . Myocardial infarction ([place]) 2011  . Peripheral vascular disease ([place])   . VHD (valvular heart disease)     Patient Active Problem List   Diagnosis Date Noted  . Neurogenic claudication 03/10/2018  . Cervical myelopathy ([place]) 12/30/2017  . SOBOE (shortness of breath on exertion) 12/14/2017  . Spinal stenosis, lumbar region, with neurogenic claudication 10/07/2017  . Chronic lower extremity pain (Right) 08/25/2017  . Spondylosis without myelopathy or radiculopathy, lumbosacral region 08/25/2017  . DDD (degenerative disc disease), lumbar 08/13/2017  . Status post right partial knee replacement 08/13/2017  . Chronic Lumbar radiculitis (intermittent/recurrent) (Bilateral) 01/29/2017  . Easy bruising 01/14/2017  . Lumbar central  spinal stenosis (Severe at L4-5) 12/30/2016  . Chronic pain syndrome 12/02/2016  . Full thickness rotator cuff tear 11/10/2016  . Stiffness of shoulder joint 11/10/2016  . Weakness of limb 11/10/2016  . Osteoarthritis of knee (Right) 11/10/2016  . Carotid stenosis 11/07/2016  . Chronic knee pain (Right) 07/30/2016  . Tear of medial meniscus of knee 07/16/2016  . Peripheral vascular disease ([place]) 08/14/2015  . Lumbar facet hypertrophy (multilevel) 08/14/2015  . Grade 1 Anterolisthesis of L3 over L4 08/14/2015  . Lumbar foraminal stenosis (Right L2-3; Bilateral L3-4, L4-5) 08/14/2015  . Chronic low back pain (Primary Source of Pain) (Bilateral) (R>L) 08/13/2015  . Lumbar spondylosis 08/13/2015  . Lumbar facet syndrome (Bilateral) (R>L) 08/13/2015  . Coronary artery disease involving native coronary artery of native heart with unstable angina pectoris ([place]) 01/08/2015  . Benign fibroma of prostate 12/08/2014  . Atherosclerosis of coronary artery 12/08/2014  . Cataract 12/08/2014  . Esophageal stenosis 12/08/2014  . Acid reflux 12/08/2014  . Glaucoma 12/08/2014  . Hemorrhoid 12/08/2014  . HLD (hyperlipidemia) 12/08/2014  . Obstructive apnea 12/08/2014  . Borderline diabetes 12/08/2014  . Esophagogastric ring 12/08/2014  . Heart valve disease 12/08/2014  . MI (mitral incompetence) 10/03/2014  . Cardiomyopathy, ischemic 10/03/2014  . History of colonic polyps 10/02/2014  . Benign essential HTN 09/20/2014    Past Surgical History:  Procedure Laterality Date  . ANTERIOR CERVICAL DECOMP/DISCECTOMY FUSION N/A 12/30/2017   Procedure: ANTERIOR CERVICAL DECOMPRESSION/DISCECTOMY FUSION 3 LEVELS- C3-6;  Surgeon: Meade Maw, MD;  Location: [facility] ORS;  Service: Neurosurgery;  Laterality: N/A;  .  CARDIAC CATHETERIZATION  2007  . CARDIAC CATHETERIZATION N/A 04/17/2015   Procedure: Left Heart Cath and Coronary Angiography;  Surgeon: Corey Skains, MD;  Location: [facility];   Service: Cardiovascular;  Laterality: N/A;  . CARPAL TUNNEL RELEASE Bilateral    right 06/09/08 left 05/03/09  . CATARACT EXTRACTION W/ INTRAOCULAR LENS  IMPLANT, BILATERAL    . COLONOSCOPY N/A 10/02/2014   Procedure: COLONOSCOPY;  Surgeon: Manya Silvas, MD;  Location: [facility] ENDOSCOPY;  Service: Endoscopy;  Laterality: N/A;  . CORONARY ARTERY BYPASS GRAFT  2007  . ESOPHAGOGASTRODUODENOSCOPY    . EYE SURGERY    . HEMORROIDECTOMY    . JOINT REPLACEMENT Right 03/2017   partial knee replacement  . KNEE ARTHROSCOPY Right 08/05/2016   Procedure: ARTHROSCOPY KNEE;  Surgeon: Thornton Park, MD;  Location: [facility] ORS;  Service: Orthopedics;  Laterality: Right;  . KNEE ARTHROSCOPY WITH LATERAL RELEASE Right 08/05/2016   Procedure: KNEE ARTHROSCOPY WITH LATERAL RELEASE;  Surgeon: Thornton Park, MD;  Location: [facility] ORS;  Service: Orthopedics;  Laterality: Right;  . KNEE ARTHROSCOPY WITH MEDIAL MENISECTOMY Right 08/05/2016   Procedure: KNEE ARTHROSCOPY WITH MEDIAL MENISECTOMY;  Surgeon: Thornton Park, MD;  Location: [facility] ORS;  Service: Orthopedics;  Laterality: Right;  Partial  . LUMBAR EPIDURAL INJECTION  2011  . LUMBAR LAMINECTOMY/DECOMPRESSION MICRODISCECTOMY N/A 03/10/2018   Procedure: LUMBAR LAMINECTOMY/DECOMPRESSION MICRODISCECTOMY 3 LEVELS-L2-5;  Surgeon: Meade Maw, MD;  Location: [facility] ORS;  Service: Neurosurgery;  Laterality: N/A;  . MOUTH SURGERY     tooth implant  . SHOULDER SURGERY Right 2011   rotator cuff repair  . SYNOVECTOMY Right 08/05/2016   Procedure: SYNOVECTOMY;  Surgeon: Thornton Park, MD;  Location: [facility] ORS;  Service: Orthopedics;  Laterality: Right;  arthroscopic extensive synovectomy  . TONSILLECTOMY      Allergies Penicillins  Social History Social History   Tobacco Use  . Smoking status: Former Smoker    Years: 15.00    Types: Cigarettes    Quit date: 1990    Years since quitting: 31.1  . Smokeless tobacco: Never Used  Substance Use Topics  . Alcohol use:  Yes    Alcohol/week: 7.0 - 14.0 standard drinks    Types: 7 - 14 Glasses of wine per week    Comment: 1-2 glasses of wine a night  . Drug use: No    Review of Systems Constitutional: Negative for fever. Cardiovascular: Positive for chest pain Respiratory: Negative for shortness of breath. Gastrointestinal: Negative for abdominal pain, vomiting and diarrhea. Musculoskeletal: Negative for back pain. Skin: Negative for rash. Neurological: Negative for headaches, focal weakness or numbness.  All systems negative/normal/unremarkable except as stated in the HPI  ____________________________________________   PHYSICAL EXAM:  VITAL SIGNS: ED Triage Vitals  Enc Vitals Group     BP      Pulse      Resp      Temp      Temp src      SpO2      Weight      Height      Head Circumference      Peak Flow      Pain Score      Pain Loc      Pain Edu?      Excl. in [place]?     Constitutional: Alert and oriented.  Mild to moderate distress from pain Eyes: Conjunctivae are normal. Normal extraocular movements. Cardiovascular: Rapid rate, regular rhythm. No murmurs, rubs, or gallops. Respiratory: Normal respiratory effort  without tachypnea nor retractions. Breath sounds are clear and equal bilaterally. No wheezes/rales/rhonchi. Gastrointestinal: Soft and nontender. Normal bowel sounds Musculoskeletal: Nontender with normal range of motion in extremities. No lower extremity tenderness nor edema. Neurologic:  Normal speech and language. No gross focal neurologic deficits are appreciated.  Skin:  Skin is warm, dry and intact. No rash noted. Psychiatric: Mood and affect are normal. Speech and behavior are normal.  ____________________________________________  EKG: Interpreted by me.  Sinus tachycardia with a rate of 109 bpm, rightward axis, incomplete right bundle blanch block, ST and T wave abnormalities concerning for ischemia, normal QT  Repeat EKG interpreted by me, sinus tachycardia with  a rate of 114 bpm, incomplete right bundle branch block, repolarization abnormality, normal QT  ____________________________________________  ED COURSE:  As part of my medical decision making, I reviewed the following data within the [place] History obtained from family if available, nursing notes, old chart and ekg, as well as notes from prior ED visits. Patient presented for chest pain, we will assess with labs and imaging as indicated at this time.   Procedures  Austin [name] was evaluated in Emergency Department on 07/09/2019 for the symptoms described in the history of present illness. He was evaluated in the context of the global COVID-19 pandemic, which necessitated consideration that the patient might be at risk for infection with the SARS-CoV-2 virus that causes COVID-19. Institutional protocols and algorithms that pertain to the evaluation of patients at risk for COVID-19 are in a state of rapid change based on information released by regulatory bodies including the CDC and federal and state organizations. These policies and algorithms were followed during the patient's care in the ED.  ____________________________________________   LABS (pertinent positives/negatives)  Labs Reviewed  CBC - Abnormal; Notable for the following components:      Result Value   RBC 4.06 (*)    Hemoglobin 12.5 (*)    HCT 37.7 (*)    All other components within normal limits  COMPREHENSIVE METABOLIC PANEL - Abnormal; Notable for the following components:   CO2 18 (*)    Glucose, Bld 137 (*)    BUN 41 (*)    Creatinine, Ser 2.07 (*)    Total Bilirubin 1.9 (*)    GFR calc non Af Amer 28 (*)    GFR calc Af Amer 32 (*)    All other components within normal limits  TROPONIN I (HIGH SENSITIVITY) - Abnormal; Notable for the following components:   Troponin I (High Sensitivity) 41 (*)    All other components within normal limits  APTT  PROTIME-INR  BRAIN NATRIURETIC PEPTIDE    CRITICAL CARE Performed by: Laurence Aly   Total critical care time: 30 minutes  Critical care time was exclusive of separately billable procedures and treating other patients.  Critical care was necessary to treat or prevent imminent or life-threatening deterioration.  Critical care was time spent personally by me on the following activities: development of treatment plan with patient and/or surrogate as well as nursing, discussions with consultants, evaluation of patient's response to treatment, examination of patient, obtaining history from patient or surrogate, ordering and performing treatments and interventions, ordering and review of laboratory studies, ordering and review of radiographic studies, pulse oximetry and re-evaluation of patient's condition.  RADIOLOGY Images were viewed by me  Chest x-ray  IMPRESSION:  Findings consistent with bilateral pulmonary edema.  ____________________________________________   DIFFERENTIAL DIAGNOSIS   Musculoskeletal pain, GERD, anxiety, MI, PE, unstable  angina  FINAL ASSESSMENT AND PLAN  Chest pain, unstable angina   Plan: The patient had presented for chest pain that started around 3 AM.  Patient's clinical symptoms and EKG findings are concerning for unstable angina with ongoing ischemia.  I ordered nitroglycerin for him as well as heparin on arrival.  He was ordered morphine and Zofran as well.  Patient's labs did reveal a troponin of 41 and some acute kidney injury compared to prior. Patient's imaging revealed findings suggestive of pulmonary edema.  We will write for some Lasix as well.  Patient/will be discussed with cardiology, I do think he has ongoing ischemia and would benefit from a heart cath sooner rather than later.   Laurence Aly, MD    Note: This note was generated in part or whole with voice recognition software. Voice recognition is usually quite accurate but there are transcription errors that can  and very often do occur. I apologize for any typographical errors that were not detected and corrected.     Earleen Newport, MD 07/09/19 1140

## 2019-07-09 NOTE — Assessment & Plan Note (Signed)
IV PPI. 

## 2019-07-09 NOTE — Assessment & Plan Note (Signed)
Pt is presenting with unstable angina that has been going on for past few weeks with worsening DOE and chest pain and pressure starting today am with abnormal ekg and troponin that is elevated. Cardiology was consulted in ed and plan is for pt to cont heparin  Drip in addition to statin and asa.  Most Recent 11/23/2018 12/02/2017 03/05/2017 02/27/2016            Lipid Profile   Total CHOL/HDL Ratio 2.7 (11/23/2018) 2.7 3.1 3.6 3.0   Cholesterol 167 (03/05/2017)   167    Cholesterol, Total 134 (11/23/2018) 134 123  147   HDL Cholesterol 49 (11/23/2018) 49 40 46 49   LDL (calc) 69 (11/23/2018) 69 64  83   Triglycerides 78 (11/23/2018) 78 94 216 76

## 2019-07-09 NOTE — ED Notes (Signed)
Attempted to call report again at this time- [employer] busy- states she will call back. Charge RN aware.

## 2019-07-09 NOTE — ED Notes (Signed)
Report to [place], [place]. RN transportation requested.

## 2019-07-09 NOTE — ED Notes (Signed)
Attempted to call report to CCU- RN not ready for report at this time. Extension provided for call-back.

## 2019-07-10 ENCOUNTER — Inpatient Hospital Stay
Admit: 2019-07-10 | Discharge: 2019-07-10 | Disposition: A | Discharge status: Home / Self Care | Payer: PPO | Attending: Internal Medicine | Admitting: Internal Medicine

## 2019-07-10 ENCOUNTER — Inpatient Hospital Stay: Payer: PPO

## 2019-07-10 DIAGNOSIS — I4891 Unspecified atrial fibrillation: Secondary | ICD-10-CM

## 2019-07-10 DIAGNOSIS — I214 Non-ST elevation (NSTEMI) myocardial infarction: Secondary | ICD-10-CM

## 2019-07-10 LAB — CBC
HCT: 34.3 % — ABNORMAL LOW (ref 39.0–52.0)
Hemoglobin: 11.3 g/dL — ABNORMAL LOW (ref 13.0–17.0)
MCH: 30.5 pg (ref 26.0–34.0)
MCHC: 32.9 g/dL (ref 30.0–36.0)
MCV: 92.5 fL (ref 80.0–100.0)
Platelets: 173 10*3/uL (ref 150–400)
RBC: 3.71 MIL/uL — ABNORMAL LOW (ref 4.22–5.81)
RDW: 13.1 % (ref 11.5–15.5)
WBC: 9.2 10*3/uL (ref 4.0–10.5)
nRBC: 0 % (ref 0.0–0.2)

## 2019-07-10 LAB — BASIC METABOLIC PANEL
Anion gap: 12 (ref 5–15)
Anion gap: 13 (ref 5–15)
BUN: 45 mg/dL — ABNORMAL HIGH (ref 8–23)
BUN: 48 mg/dL — ABNORMAL HIGH (ref 8–23)
CO2: 22 mmol/L (ref 22–32)
CO2: 21 mmol/L — ABNORMAL LOW (ref 22–32)
Calcium: 8.3 mg/dL — ABNORMAL LOW (ref 8.9–10.3)
Calcium: 8.4 mg/dL — ABNORMAL LOW (ref 8.9–10.3)
Chloride: 102 mmol/L (ref 98–111)
Chloride: 101 mmol/L (ref 98–111)
Creatinine, Ser: 2.39 mg/dL — ABNORMAL HIGH (ref 0.61–1.24)
Creatinine, Ser: 2.51 mg/dL — ABNORMAL HIGH (ref 0.61–1.24)
GFR calc Af Amer: 27 mL/min — ABNORMAL LOW (ref >60)
GFR calc Af Amer: 25 mL/min — ABNORMAL LOW (ref >60)
GFR calc non Af Amer: 23 mL/min — ABNORMAL LOW (ref >60)
GFR calc non Af Amer: 22 mL/min — ABNORMAL LOW (ref >60)
Glucose, Bld: 118 mg/dL — ABNORMAL HIGH (ref 70–99)
Glucose, Bld: 101 mg/dL — ABNORMAL HIGH (ref 70–99)
Potassium: 4 mmol/L (ref 3.5–5.1)
Potassium: 4.1 mmol/L (ref 3.5–5.1)
Sodium: 136 mmol/L (ref 135–145)
Sodium: 135 mmol/L (ref 135–145)

## 2019-07-10 LAB — HEPARIN LEVEL (UNFRACTIONATED)
Heparin Unfractionated: <0.1 IU/mL — ABNORMAL LOW (ref 0.30–0.70)
Heparin Unfractionated: 0.18 IU/mL — ABNORMAL LOW (ref 0.30–0.70)
Heparin Unfractionated: 0.1 IU/mL — ABNORMAL LOW (ref 0.30–0.70)

## 2019-07-10 LAB — BRAIN NATRIURETIC PEPTIDE: B Natriuretic Peptide: 537 pg/mL — ABNORMAL HIGH (ref 0.0–100.0)

## 2019-07-10 MED ORDER — CHLORHEXIDINE GLUCONATE CLOTH 2 % EX PADS
6.0000 | MEDICATED_PAD | Freq: Every day | CUTANEOUS | Status: DC
  Administered 2019-07-10 – 2019-07-14 (×4): 6 via TOPICAL

## 2019-07-10 MED ORDER — PERFLUTREN LIPID MICROSPHERE
1.0000 mL | INTRAVENOUS | Status: AC | PRN
  Administered 2019-07-10: 3 mL via INTRAVENOUS
  Filled 2019-07-10: qty 10

## 2019-07-10 MED ORDER — HEPARIN BOLUS VIA INFUSION
2000.0000 [IU] | Freq: Once | INTRAVENOUS | Status: AC
  Administered 2019-07-10: 2000 [IU] via INTRAVENOUS
  Filled 2019-07-10: qty 2000

## 2019-07-10 MED ORDER — HEPARIN BOLUS VIA INFUSION
2000.0000 [IU] | Freq: Once | INTRAVENOUS | Status: AC
  Administered 2019-07-10: 12:00:00 2000 [IU] via INTRAVENOUS
  Filled 2019-07-10: qty 2000

## 2019-07-10 MED ORDER — HEPARIN SODIUM (PORCINE) 5000 UNIT/ML IJ SOLN
INTRAMUSCULAR | Status: AC
  Filled 2019-07-10: qty 1

## 2019-07-10 NOTE — Progress Notes (Addendum)
Patient Name: Austin [name] Date of Encounter: 07/10/2019  Hospital Problem List     Principal Problem:   Coronary artery disease involving native coronary artery of native heart with unstable angina pectoris [facility]) Active Problems:   Benign essential HTN   Acid reflux   Glaucoma   Borderline diabetes    Atrial fibrillation [facility])    Patient Profile     86 y.o. male with history of  CAD s/p CABG x 3 in 2007 with LIMA to LAD, SVG to OM1, and PDA (cath from 2016 with 3/3 functional grafts), mild aortic stenosis, HTN, HLD, and carotid artery stenosis (40-59% to bilateral ICAs), followed by Dr. Lucky [name], who presents emergency room today with chest pain that started approximately 3 AM today.  He describes nausea, diaphoresis and shortness of breath.  Pain was 7 on 10 on presentation which improved with sublingual nitroglycerin.  Stated his pain was similar to his angina.  EKG in the emergency room revealed ST depression in the lateral leads.  Initial troponin was 41 with a subsequent value of 51.  He was started on heparin.  BNP was 304.  Creatinine was 2.07 with a baseline of approximately 1.5.  Last p.m. patient became extremely hypoxic and dyspneic with chest tightness.  Improved with BiPAP, diuresis.  This a.m. less dyspneic still breathing with BiPAP.  Echo revealed preserved LV function with EF approximately 50 to 55%.  No chest pain this morning.  Acute on chronic renal insufficiency.  Subjective   Less short of breath.  Anxious to get BiPAP off.  Hemodynamically stable, no chest pain  Inpatient Medications    . aspirin EC  81 mg Oral Daily  . atorvastatin  80 mg Oral q1800  . chlorhexidine  15 mL Mouth Rinse BID  . Chlorhexidine Gluconate Cloth  6 each Topical Daily  . dorzolamide-timolol  1 drop Both Eyes BID  . furosemide  40 mg Intravenous Q6H  . latanoprost  1 drop Both Eyes QHS  . mouth rinse  15 mL Mouth Rinse q12n4p  . metoprolol tartrate  12.5 mg Oral BID  .  multivitamin-lutein  1 capsule Oral BID  . omega-3 acid ethyl esters  1 g Oral BID  . pantoprazole (PROTONIX) IV  40 mg Intravenous Q12H  . sulfamethoxazole-trimethoprim  1 tablet Oral Q12H  . tamsulosin  0.4 mg Oral Daily    Vital Signs    Vitals:   07/10/19 0600 07/10/19 0700 07/10/19 0734 07/10/19 0800  BP: 109/61 102/66  (!) 110/50  Pulse: 81 60 81 74  Resp: 20 18  15   Temp:    98.3 F (36.8 C)  TempSrc:    Axillary  SpO2: 94% 95% 95% 95%  Weight:      Height:        Intake/Output Summary (Last 24 hours) at 07/10/2019 0928 Last data filed at 07/10/2019 0400 Gross per 24 hour  Intake 227.61 ml  Output 1750 ml  Net -1522.39 ml   Filed Weights   07/09/19 1052 07/09/19 1523 07/10/19 0126  Weight: 77.1 kg 78.4 kg 78.4 kg    Physical Exam    GEN: Well nourished, well developed, in no acute distress.  HEENT: normal.  Neck: Supple, no JVD, carotid bruits, or masses. Cardiac: RRR, no murmurs, rubs, or gallops. No clubbing, cyanosis, edema.  Radials/DP/PT 2+ and equal bilaterally.  Respiratory:  Respirations regular and unlabored, clear to auscultation bilaterally. GI: Soft, nontender, nondistended, BS + x 4. MS: no deformity  or atrophy. Skin: warm and dry, no rash. Neuro:  Strength and sensation are intact. Psych: Normal affect.  Labs    CBC Recent Labs    07/09/19 1057 07/10/19 0512  WBC 10.0 9.2  HGB 12.5* 11.3*  HCT 37.7* 34.3*  MCV 92.9 92.5  PLT 164 242   Basic Metabolic Panel Recent Labs    07/09/19 1057 07/10/19 0512  NA 135 136  K 4.2 4.0  CL 102 102  CO2 18* 22  GLUCOSE 137* 118*  BUN 41* 45*  CREATININE 2.07* 2.39*  CALCIUM 9.1 8.3*   Liver Function Tests Recent Labs    07/09/19 1057  AST 22  ALT 24  ALKPHOS 82  BILITOT 1.9*  PROT 7.8  ALBUMIN 4.1   No results for input(s): LIPASE, AMYLASE in the last 72 hours. Cardiac Enzymes No results for input(s): CKTOTAL, CKMB, CKMBINDEX, TROPONINI in the last 72 hours. BNP Recent Labs     07/09/19 1116 07/10/19 0512  BNP 304.0* 537.0*   D-Dimer No results for input(s): DDIMER in the last 72 hours. Hemoglobin A1C No results for input(s): HGBA1C in the last 72 hours. Fasting Lipid Panel No results for input(s): CHOL, HDL, LDLCALC, TRIG, CHOLHDL, LDLDIRECT in the last 72 hours. Thyroid Function Tests No results for input(s): TSH, T4TOTAL, T3FREE, THYROIDAB in the last 72 hours.  Invalid input(s): FREET3  Telemetry    Atrial fibrillation will variable but controlled ventricular response  ECG    Last p.m. EKG revealed probable atrial fibrillation with rapid ventricular response.  Radiology    DG Chest Port 1 View  Result Date: 07/09/2019 CLINICAL DATA:  Chest pain EXAM: PORTABLE CHEST 1 VIEW COMPARISON:  07/09/2019 FINDINGS: Bilateral airspace disease again noted, worsening on the right since prior study with slight improvement on the left. Cardiomegaly. Suspect small right pleural effusion. No acute bony abnormality. IMPRESSION: Bilateral airspace disease, improving on the left and worsening on the right. This could reflect asymmetric edema or infection. Small right effusion. Electronically Signed   By: Rolm Baptise M.D.   On: 07/09/2019 17:15   DG Chest Port 1 View  Result Date: 07/09/2019 CLINICAL DATA:  Chest pain. EXAM: PORTABLE CHEST 1 VIEW COMPARISON:  August 23, 2010. FINDINGS: Stable cardiomegaly. Status post coronary bypass graft. Interstitial densities are noted throughout both lungs most consistent with pulmonary edema. No pneumothorax or pleural effusion is noted. Bony thorax is unremarkable. IMPRESSION: Findings consistent with bilateral pulmonary edema. Electronically Signed   By: Marijo Conception M.D.   On: 07/09/2019 11:35    Assessment & Plan     86 y.o. male with history of  CAD s/p CABG x 3 in 2007 with LIMA to LAD, SVG to OM1, and PDA (cath from 2016 with 3/3 functional grafts), mild aortic stenosis, HTN, HLD, and carotid artery stenosis (40-59% to  bilateral ICAs), followed by Dr. Lucky [name] who presented to the ER with chest pain starting at 3 AM this morning.  EKG showed ST depression in the lateral leads which was somewhat more normal than his baseline.  Initial troponin was 41 followed by 51.  Was placed on heparin.  Developed acute shortness of breath with hypoxia and chest pain.  ST depression deep and somewhat in the lateral leads.  1.  Coronary artery disease   Status post coronary artery bypass grafting x3 in 2007 with a LIMA to the LAD, vein graft to OM1 and PDA.  Cardiac cath in 2016 revealed 3 functional grasp with mild aortic  stenosis.  Presented with chest tightness shortness of breath and hypoxia.  Had a mild troponin elevation peaking at 240.  Has no chest pain at present.  No ischemic changes on electrocardiogram.  Likely will need consideration for invasive evaluation prior to discharge however will need to optimize pulmonary function, renal function before consideration of this.  Would continue with heparin IV, control rate with metoprolol tartrate 12.5 mg twice daily for now.  If rate picks up will need to consider increasing to 25 mg twice daily.  We will continue with IV nitroglycerin to titrate pressure and for afterload reduction.  Continue with atorvastatin and aspirin.  Likely not an acute coronary event but demand ischemia with limited coronary reserve.  We will continue to follow.  2.  Respiratory failure   Continue with BiPAP for now weaning as tolerated.  We will continue to diurese following renal function, chest x-ray, hemodynamics and electrolytes.  BNP increased to 537 up from 304.  Will need to follow.  Appreciate Dr. Teodoro Kil care  3.  Acute on chronic renal insufficiency   Creatinine bumped this morning.  Diuresing fairly well.  Will need to follow renal function.  Likely AKI due to hypoxia and hemodynamics with events of yesterday.  We will continue to follow and adjust renally cleared drugs appropriately.  4.   Atrial fibrillation   Apparently new.  May have begun prior to admission and loss of atrial kick may have exacerbated his clinical decline.  We will continue with rate control with metoprolol for now and anticoagulate with heparin.  Prior to discharge will need chronic anticoagulation and consideration for further antiarrhythmic treatment control.  This likely would be amiodarone if renal and pulmonary status allow.  Would not start amiodarone at present given relative instability of his pulmonary and kidney status.  5.  Congestive heart failure   Echo revealed fairly preserved LV function EF around 50% globally with mild LVH.  A sclerotic to minimally stenotic aortic valve.  No obvious wall motion abnormality.  We will continue to carefully diurese.  EF about 40% so we will continue with metoprolol tartrate.  Would not afterload reduced at present given preserved LV function and acute renal insufficiency on last directed by nephrology.    Signed, Javier Docker Layth Cerezo MD 07/10/2019, 9:28 AM  Pager: [phone]

## 2019-07-10 NOTE — Progress Notes (Signed)
PROGRESS NOTE    Austin [name]  TDV:[id number] DOB: 03-May-1929 DOA: 07/09/2019 PCP: Jerrol Banana., MD    Assessment & Plan:   Principal Problem:   Coronary artery disease involving native coronary artery of native heart with unstable angina pectoris [facility]) Active Problems:   Benign essential HTN   Acid reflux   Glaucoma   Borderline diabetes   NSTEMI (non-ST elevated myocardial infarction) (HCC)   Atrial fibrillation (HCC)    Acute Hypoxic Respiratory Failure -due to florid pulmonary edema secondary to acute on chronic CHF as well as renal insufficiency.  Weaned off BiPAP to 4L today. -TTE in process -continue Bronchodilator Therapy -d/c IV Morphine  --continue diuresis with Lasix 40 mg every 6 hours --Strict I/O  Non-ST elevation MI -History of severe CAD status post CABG -Cardiology on case-appreciate input -Status post heparin gtt /aspirin/nitroglycerin/morphine/metoprolol -oxygen as needed --further ischemic eval per cards.  Renal Failure-acute on chronic stage II -follow UO -continue Foley Catheter-assess need daily   DVT prophylaxis: Heparin SQ Code Status: Full code  Family Communication: wife updated at bedside Disposition Plan: Likely home   Subjective and Interval History:  Pt reported feeling and breathing much better.  No more chest pain.  No fever, abdominal pain, N/V/D, dysuria.  Normal PO intake.  Good urine output.    Objective: Vitals:   07/11/19 0500 07/11/19 0600 07/11/19 0700 07/11/19 0800  BP: 119/65 125/61 123/69 (!) 143/32  Pulse: (!) 59 75 69 78  Resp: (!) 32 (!) 22 (!) 26 (!) 22  Temp:      TempSrc:      SpO2: 93% 100% (!) 85% 100%  Weight: 76.9 kg     Height:        Intake/Output Summary (Last 24 hours) at 07/11/2019 0953 Last data filed at 07/11/2019 0600 Gross per 24 hour  Intake 368.68 ml  Output 2100 ml  Net -1731.32 ml   Filed Weights   07/09/19 1523 07/10/19 0126 07/11/19 0500  Weight: 78.4 kg 78.4 kg 76.9  kg    Examination:   Constitutional: NAD, AAOx3, sitting up in chair talking with his wife HEENT: conjunctivae and lids normal, EOMI CV: irregularly irregular no M,R,G. Distal pulses +2.  No cyanosis.   RESP: No obvious crackles, normal respiratory effort, on 4L, but easily desat with talking GI: +BS, NTND Extremities: No effusions, edema, or tenderness in BLE SKIN: warm, dry and intact Neuro: II - XII grossly intact.  Sensation intact Psych: Normal mood and affect.  Appropriate judgement and reason   Data Reviewed: I have personally reviewed following labs and imaging studies  CBC: Recent Labs  Lab 07/09/19 1057 07/10/19 0512 07/11/19 0546  WBC 10.0 9.2 7.6  HGB 12.5* 11.3* 11.6*  HCT 37.7* 34.3* 34.2*  MCV 92.9 92.5 91.4  PLT 164 173 062   Basic Metabolic Panel: Recent Labs  Lab 07/09/19 1057 07/10/19 0512 07/10/19 0953 07/11/19 0546  NA 135 136 135 129*  K 4.2 4.0 4.1 3.4*  CL 102 102 101 94*  CO2 18* 22 21* 21*  GLUCOSE 137* 118* 101* 101*  BUN 41* 45* 48* 66*  CREATININE 2.07* 2.39* 2.51* 3.01*  CALCIUM 9.1 8.3* 8.4* 8.0*   GFR: Estimated Creatinine Clearance: 16.6 mL/min (A) (by C-G formula based on SCr of 3.01 mg/dL (H)). Liver Function Tests: Recent Labs  Lab 07/09/19 1057  AST 22  ALT 24  ALKPHOS 82  BILITOT 1.9*  PROT 7.8  ALBUMIN 4.1   No  results for input(s): LIPASE, AMYLASE in the last 168 hours. No results for input(s): AMMONIA in the last 168 hours. Coagulation Profile: Recent Labs  Lab 07/09/19 1113  INR 1.3*   Cardiac Enzymes: No results for input(s): CKTOTAL, CKMB, CKMBINDEX, TROPONINI in the last 168 hours. BNP (last 3 results) No results for input(s): PROBNP in the last 8760 hours. HbA1C: No results for input(s): HGBA1C in the last 72 hours. CBG: Recent Labs  Lab 07/09/19 1524  GLUCAP 141*   Lipid Profile: No results for input(s): CHOL, HDL, LDLCALC, TRIG, CHOLHDL, LDLDIRECT in the last 72 hours. Thyroid Function  Tests: No results for input(s): TSH, T4TOTAL, FREET4, T3FREE, THYROIDAB in the last 72 hours. Anemia Panel: No results for input(s): VITAMINB12, FOLATE, FERRITIN, TIBC, IRON, RETICCTPCT in the last 72 hours. Sepsis Labs: Recent Labs  Lab 07/09/19 1900  PROCALCITON 0.18    Recent Results (from the past 240 hour(s))  Respiratory Panel by RT PCR (Flu A&B, Covid) - Nasopharyngeal Swab     Status: None   Collection Time: 07/09/19 11:42 AM   Specimen: Nasopharyngeal Swab  Result Value Ref Range Status   SARS Coronavirus 2 by RT PCR NEGATIVE NEGATIVE Final    Comment: (NOTE) SARS-CoV-2 target nucleic acids are NOT DETECTED. The SARS-CoV-2 RNA is generally detectable in upper respiratoy specimens during the acute phase of infection. The lowest concentration of SARS-CoV-2 viral copies this assay can detect is 131 copies/mL. A negative result does not preclude SARS-Cov-2 infection and should not be used as the sole basis for treatment or other patient management decisions. A negative result may occur with  improper specimen collection/handling, submission of specimen other than nasopharyngeal swab, presence of viral mutation(s) within the areas targeted by this assay, and inadequate number of viral copies (<131 copies/mL). A negative result must be combined with clinical observations, patient history, and epidemiological information. The expected result is Negative. Fact Sheet for Patients:  PinkCheek.be Fact Sheet for Healthcare Providers:  GravelBags.it This test is not yet ap proved or cleared by the [place] FDA and  has been authorized for detection and/or diagnosis of SARS-CoV-2 by FDA under an Emergency Use Authorization (EUA). This EUA will remain  in effect (meaning this test can be used) for the duration of the COVID-19 declaration under Section 564(b)(1) of the Act, 21 U.S.C. section 360bbb-3(b)(1), unless the  authorization is terminated or revoked sooner.    Influenza A by PCR NEGATIVE NEGATIVE Final   Influenza B by PCR NEGATIVE NEGATIVE Final    Comment: (NOTE) The Xpert Xpress SARS-CoV-2/FLU/RSV assay is intended as an aid in  the diagnosis of influenza from Nasopharyngeal swab specimens and  should not be used as a sole basis for treatment. Nasal washings and  aspirates are unacceptable for Xpert Xpress SARS-CoV-2/FLU/RSV  testing. Fact Sheet for Patients: PinkCheek.be Fact Sheet for Healthcare Providers: GravelBags.it This test is not yet approved or cleared by the [place] FDA and  has been authorized for detection and/or diagnosis of SARS-CoV-2 by  FDA under an Emergency Use Authorization (EUA). This EUA will remain  in effect (meaning this test can be used) for the duration of the  Covid-19 declaration under Section 564(b)(1) of the Act, 21  U.S.C. section 360bbb-3(b)(1), unless the authorization is  terminated or revoked. Performed at [facility], [address] [place], [place] [address]   MRSA PCR Screening     Status: None   Collection Time: 07/09/19  3:29 PM   Specimen: Nasopharyngeal  Result Value Ref Range Status   MRSA by PCR NEGATIVE NEGATIVE Final    Comment:        The GeneXpert MRSA Assay (FDA approved for NASAL specimens only), is one component of a comprehensive MRSA colonization surveillance program. It is not intended to diagnose MRSA infection nor to guide or monitor treatment for MRSA infections. Performed at [facility], [address] [place], [place] [address]   CULTURE, BLOOD (ROUTINE X 2) w Reflex to ID Panel     Status: None (Preliminary result)   Collection Time: 07/09/19  7:00 PM   Specimen: BLOOD  Result Value Ref Range Status   Specimen Description BLOOD LEFT HAND  Final   Special Requests   Final    BOTTLES DRAWN AEROBIC AND ANAEROBIC Blood Culture  adequate volume   Culture   Final    NO GROWTH 2 DAYS Performed at [facility], [address] [place], [place] [address]    Report Status PENDING  Incomplete  CULTURE, BLOOD (ROUTINE X 2) w Reflex to ID Panel     Status: None (Preliminary result)   Collection Time: 07/09/19  7:15 PM   Specimen: BLOOD  Result Value Ref Range Status   Specimen Description BLOOD LEFT ANTECUBITAL  Final   Special Requests   Final    BOTTLES DRAWN AEROBIC AND ANAEROBIC Blood Culture adequate volume   Culture   Final    NO GROWTH 2 DAYS Performed at [facility], [address], [place], [place] [address]    Report Status PENDING  Incomplete      Radiology Studies: DG Chest Port 1 View  Result Date: 07/10/2019 CLINICAL DATA:  Chest pain and shortness of breath EXAM: PORTABLE CHEST 1 VIEW COMPARISON:  07/09/2019 FINDINGS: Cardiomegaly and CABG changes again noted. Bilateral interstitial and airspace opacities, RIGHT greater than LEFT, are slightly improved. There may be trace bilateral pleural effusions present. No pneumothorax. IMPRESSION: Slightly improved bilateral interstitial and airspace opacities, RIGHT greater than LEFT. Electronically Signed   By: Margarette Canada M.D.   On: 07/10/2019 10:58   DG Chest Port 1 View  Result Date: 07/09/2019 CLINICAL DATA:  Chest pain EXAM: PORTABLE CHEST 1 VIEW COMPARISON:  07/09/2019 FINDINGS: Bilateral airspace disease again noted, worsening on the right since prior study with slight improvement on the left. Cardiomegaly. Suspect small right pleural effusion. No acute bony abnormality. IMPRESSION: Bilateral airspace disease, improving on the left and worsening on the right. This could reflect asymmetric edema or infection. Small right effusion. Electronically Signed   By: Rolm Baptise M.D.   On: 07/09/2019 17:15   DG Chest Port 1 View  Result Date: 07/09/2019 CLINICAL DATA:  Chest pain. EXAM: PORTABLE CHEST 1 VIEW COMPARISON:  August 23, 2010.  FINDINGS: Stable cardiomegaly. Status post coronary bypass graft. Interstitial densities are noted throughout both lungs most consistent with pulmonary edema. No pneumothorax or pleural effusion is noted. Bony thorax is unremarkable. IMPRESSION: Findings consistent with bilateral pulmonary edema. Electronically Signed   By: Marijo Conception M.D.   On: 07/09/2019 11:35   ECHOCARDIOGRAM COMPLETE  Result Date: 07/11/2019   ECHOCARDIOGRAM REPORT   Patient Name:   Austin [name] Date of Exam: 07/10/2019 Medical Rec #:  [id number]        Height:       66.0 in Accession #:    [id number]       Weight:       172.8 lb Date of Birth:  23-Sep-1928         BSA:          1.88 m Patient Age:    46 years         BP:           140/60 mmHg Patient Gender: M                HR:           66 bpm. Exam Location:  [facility] Procedure: 2D Echo and Intracardiac Opacification Agent Indications:     NSTEMI I21.4  History:         Patient has no prior history of Echocardiogram examinations.  Sonographer:     Arville Go RDCS Referring Phys:  [place] Diagnosing Phys: Bartholome Bill MD  Sonographer Comments: Technically difficult study due to poor echo windows. IMPRESSIONS  1. Left ventricular ejection fraction, by visual estimation, is 50 to 55%. The left ventricle has low normal function. Left ventricular septal wall thickness was mildly increased. Mildly increased left ventricular posterior wall thickness. There is mildly increased left ventricular hypertrophy.  2. Definity contrast agent was given IV to delineate the left ventricular endocardial borders.  3. Left ventricular diastolic parameters are consistent with Grade I diastolic dysfunction (impaired relaxation).  4. The left ventricle has no regional wall motion abnormalities.  5. Global right ventricle has normal systolic function.The right ventricular size is normal. No increase in right ventricular wall thickness.  6. Left atrial size was mildly dilated.  7. Right atrial size  was normal.  8. The mitral valve is degenerative. Mild mitral valve regurgitation.  9. The tricuspid valve is not well visualized. 10. The tricuspid valve is not well visualized. Tricuspid valve regurgitation is trivial. 11. The aortic valve is abnormal. Aortic valve regurgitation is trivial. Mild to moderate aortic valve sclerosis/calcification without any evidence of aortic stenosis. 12. The pulmonic valve was not well visualized. Pulmonic valve regurgitation is trivial. 13. The atrial septum is grossly normal. FINDINGS  Left Ventricle: Left ventricular ejection fraction, by visual estimation, is 50 to 55%. The left ventricle has low normal function. Definity contrast agent was given IV to delineate the left ventricular endocardial borders. The left ventricle has no regional wall motion abnormalities. Mildly increased left ventricular posterior wall thickness. There is mildly increased left ventricular hypertrophy. Left ventricular diastolic parameters are consistent with Grade I diastolic dysfunction (impaired relaxation). Right Ventricle: The right ventricular size is normal. No increase in right ventricular wall thickness. Global RV systolic function is has normal systolic function. Left Atrium: Left atrial size was mildly dilated. Right Atrium: Right atrial size was normal in size Pericardium: There is no evidence of pericardial effusion. Mitral Valve: The mitral valve is degenerative in appearance. Mild mitral valve regurgitation. Tricuspid Valve: The tricuspid valve is not well visualized. Tricuspid valve regurgitation is trivial. Aortic Valve: The aortic valve is abnormal. Aortic valve regurgitation is trivial. Mild to moderate aortic valve sclerosis/calcification is present, without any evidence of aortic stenosis. Aortic valve mean gradient measures 9.0 mmHg. Aortic valve peak gradient measures 17.1 mmHg. Aortic valve area, by VTI measures 1.73 cm. Pulmonic Valve: The pulmonic valve was not well  visualized. Pulmonic valve regurgitation is trivial. Pulmonic regurgitation is trivial. Aorta: The aortic root is normal in size and structure. IAS/Shunts: The atrial septum is grossly normal.  LEFT VENTRICLE PLAX 2D LVIDd:         5.46 cm       Diastology LVIDs:  4.32 cm       LV e' lateral:   9.79 cm/s LV PW:         1.08 cm       LV E/e' lateral: 13.0 LV IVS:        1.43 cm LVOT diam:     2.10 cm LV SV:         61 ml LV SV Index:   31.60 LVOT Area:     3.46 cm  LV Volumes (MOD) LV area d, A2C:    38.40 cm LV area d, A4C:    28.20 cm LV area s, A2C:    27.50 cm LV area s, A4C:    17.70 cm LV major d, A2C:   7.94 cm LV major d, A4C:   6.99 cm LV major s, A2C:   7.12 cm LV major s, A4C:   6.66 cm LV vol d, MOD A2C: 150.0 ml LV vol d, MOD A4C: 92.8 ml LV vol s, MOD A2C: 85.6 ml LV vol s, MOD A4C: 38.3 ml LV SV MOD A2C:     64.4 ml LV SV MOD A4C:     92.8 ml LV SV MOD BP:      66.5 ml RIGHT VENTRICLE RV Basal diam:  2.63 cm RV S prime:     8.27 cm/s TAPSE (M-mode): 1.3 cm LEFT ATRIUM             Index       RIGHT ATRIUM           Index LA diam:        4.80 cm 2.55 cm/m  RA Area:     16.20 cm LA Vol (A2C):   73.7 ml 39.21 ml/m RA Volume:   40.10 ml  21.34 ml/m LA Vol (A4C):   40.9 ml 21.76 ml/m LA Biplane Vol: 57.6 ml 30.65 ml/m  AORTIC VALVE                    PULMONIC VALVE AV Area (Vmax):    1.56 cm     PV Vmax:       1.09 m/s AV Area (Vmean):   1.61 cm     PV Peak grad:  4.8 mmHg AV Area (VTI):     1.73 cm AV Vmax:           207.00 cm/s AV Vmean:          137.000 cm/s AV VTI:            0.374 m AV Peak Grad:      17.1 mmHg AV Mean Grad:      9.0 mmHg LVOT Vmax:         93.20 cm/s LVOT Vmean:        63.500 cm/s LVOT VTI:          0.187 m LVOT/AV VTI ratio: 0.50  AORTA Ao Root diam: 2.60 cm Ao Asc diam:  3.50 cm MITRAL VALVE MV Area (PHT): 3.60 cm             SHUNTS MV PHT:        61.19 msec           Systemic VTI:  0.19 m MV Decel Time: 211 msec             Systemic Diam: 2.10 cm MV E velocity:  127.00 cm/s 103 cm/s  Bartholome Bill MD Electronically signed by Bartholome Bill MD Signature Date/Time: 07/11/2019/7:24:55 AM  Final      Scheduled Meds: . aspirin EC  81 mg Oral Daily  . atorvastatin  80 mg Oral q1800  . chlorhexidine  15 mL Mouth Rinse BID  . Chlorhexidine Gluconate Cloth  6 each Topical Daily  . dorzolamide-timolol  1 drop Both Eyes BID  . latanoprost  1 drop Both Eyes QHS  . mouth rinse  15 mL Mouth Rinse q12n4p  . metoprolol tartrate  12.5 mg Oral BID  . multivitamin-lutein  1 capsule Oral BID  . omega-3 acid ethyl esters  1 g Oral BID  . pantoprazole (PROTONIX) IV  40 mg Intravenous Q12H  . sulfamethoxazole-trimethoprim  1 tablet Oral Q12H  . tamsulosin  0.4 mg Oral Daily   Continuous Infusions: . amiodarone     Followed by  . amiodarone    . heparin 1,500 Units/hr (07/11/19 0058)  . nitroGLYCERIN Stopped (07/10/19 0516)     LOS: 2 days     Enzo Bi, MD [facility] If 7PM-7AM, please contact night-coverage 07/11/2019, 9:53 AM

## 2019-07-10 NOTE — Progress Notes (Signed)
[place] for heparin Indication: chest pain/ACS  Allergies  Allergen Reactions  . Penicillins Itching, Swelling, Rash and Other (See Comments)    Has patient had a PCN reaction causing immediate rash, facial/tongue/throat swelling, SOB or lightheadedness with hypotension: Yes Has patient had a PCN reaction causing severe rash involving mucus membranes or skin necrosis: No Has patient had a PCN reaction that required hospitalization No Has patient had a PCN reaction occurring within the last 10 years: No If all of the above answers are "NO", then may proceed with Cephalosporin use.     Patient Measurements: Height: 5\' 6"  (167.6 cm) Weight: 172 lb 13.5 oz (78.4 kg) IBW/kg (Calculated) : 63.8 Heparin Dosing Weight: 77 kg  Vital Signs: Temp: 98.3 F (36.8 C) (02/07 0800) Temp Source: Axillary (02/07 0800) BP: 110/50 (02/07 0800) Pulse Rate: 74 (02/07 0800)  Labs: Recent Labs    07/09/19 1057 07/09/19 1113 07/09/19 1142 07/09/19 1354 07/09/19 1900 07/09/19 2233 07/10/19 0512 07/10/19 0953  HGB 12.5*  --   --   --   --   --  11.3*  --   HCT 37.7*  --   --   --   --   --  34.3*  --   PLT 164  --   --   --   --   --  173  --   APTT  --  46*  --   --   --   --   --   --   LABPROT  --  15.7*  --   --   --   --   --   --   INR  --  1.3*  --   --   --   --   --   --   HEPARINUNFRC  --   --  <0.10*  --   --  <0.10*  --  0.18*  CREATININE 2.07*  --   --   --   --   --  2.39* 2.51*  TROPONINIHS 41*  --   --  51* 240*  --   --   --     Estimated Creatinine Clearance: 20 mL/min (A) (by C-G formula based on SCr of 2.51 mg/dL (H)).   Medical History: Past Medical History:  Diagnosis Date  . Absolute anemia 12/08/2014  . Allergy   . Anginal pain ([place])    much less angina d/t lack of activity  . Arthritis    all over body  . Bradycardia 05/03/2015  . Calculus of kidney 12/08/2014  . Coronary artery disease   . GERD (gastroesophageal reflux  disease)   . Glaucoma   . History of kidney stones   . HLD (hyperlipidemia)   . Hyperglycemia 08/27/2015  . Hypertension   . Myocardial infarction ([place]) 2011  . Peripheral vascular disease ([place])   . VHD (valvular heart disease)    Assessment: 86 year old male presented with chest pain. No known prior anticoagulation. Patient to start on heparin drip. Pharmacy consulted.  2/6 2233 HL < 0.10, subtherapeutic.  Confirmed w/ RN no problems w/ infusion.  Goal of Therapy:  Heparin level 0.3-0.7 units/ml Monitor platelets by anticoagulation protocol: Yes   Plan:  2/7 0953 HL 0.18 subtherapeutic. Heparin 2000 unit bolus followed by increase in drip to 1250 units/hr. HL at 2100. CBC daily while on heparin drip.  Tawnya Crook, PharmD 07/10/2019,11:53 AM

## 2019-07-10 NOTE — Progress Notes (Addendum)
[place] for heparin Indication: chest pain/ACS  Allergies  Allergen Reactions  . Penicillins Itching, Swelling, Rash and Other (See Comments)    Has patient had a PCN reaction causing immediate rash, facial/tongue/throat swelling, SOB or lightheadedness with hypotension: Yes Has patient had a PCN reaction causing severe rash involving mucus membranes or skin necrosis: No Has patient had a PCN reaction that required hospitalization No Has patient had a PCN reaction occurring within the last 10 years: No If all of the above answers are "NO", then may proceed with Cephalosporin use.     Patient Measurements: Height: 5\' 6"  (167.6 cm) Weight: 172 lb 13.5 oz (78.4 kg) IBW/kg (Calculated) : 63.8 Heparin Dosing Weight: 77 kg  Vital Signs: Temp: 99.1 F (37.3 C) (02/06 2000) Temp Source: Oral (02/06 2000) BP: 126/70 (02/06 2200) Pulse Rate: 117 (02/06 2200)  Labs: Recent Labs    07/09/19 1057 07/09/19 1113 07/09/19 1142 07/09/19 1354 07/09/19 1900 07/09/19 2233  HGB 12.5*  --   --   --   --   --   HCT 37.7*  --   --   --   --   --   PLT 164  --   --   --   --   --   APTT  --  [place]*  --   --   --   --   LABPROT  --  15.7*  --   --   --   --   INR  --  1.3*  --   --   --   --   HEPARINUNFRC  --   --  <0.10*  --   --  <0.10*  CREATININE 2.07*  --   --   --   --   --   TROPONINIHS 41*  --   --  51* 240*  --     Estimated Creatinine Clearance: 24.3 mL/min (A) (by C-G formula based on SCr of 2.07 mg/dL (H)).   Medical History: Past Medical History:  Diagnosis Date  . Absolute anemia 12/08/2014  . Allergy   . Anginal pain ([place])    much less angina d/t lack of activity  . Arthritis    all over body  . Bradycardia 05/03/2015  . Calculus of kidney 12/08/2014  . Coronary artery disease   . GERD (gastroesophageal reflux disease)   . Glaucoma   . History of kidney stones   . HLD (hyperlipidemia)   . Hyperglycemia 08/27/2015  . Hypertension    . Myocardial infarction ([place]) 2011  . Peripheral vascular disease ([place])   . VHD (valvular heart disease)    Assessment: 86 year old Austin [name] presented with chest pain. No known prior anticoagulation. Patient to start on heparin drip. Pharmacy consulted.  2/6 2233 HL < 0.10, subtherapeutic.  Confirmed w/ RN no problems w/ infusion.  Goal of Therapy:  Heparin level 0.3-0.7 units/ml Monitor platelets by anticoagulation protocol: Yes   Plan:  Heparin 2000 unit rebolus and increase heparin drip to 1100 units/hr. Recheck HL in ~ 8 hours.  CBC daily while on heparin drip.  Ena Dawley, PharmD 07/10/2019,1:07 AM

## 2019-07-10 NOTE — Progress Notes (Signed)
Interior for heparin Indication: chest pain/ACS  Allergies  Allergen Reactions  . Penicillins Itching, Swelling, Rash and Other (See Comments)    Has patient had a PCN reaction causing immediate rash, facial/tongue/throat swelling, SOB or lightheadedness with hypotension: Yes Has patient had a PCN reaction causing severe rash involving mucus membranes or skin necrosis: No Has patient had a PCN reaction that required hospitalization No Has patient had a PCN reaction occurring within the last 10 years: No If all of the above answers are "NO", then may proceed with Cephalosporin use.     Patient Measurements: Height: 5\' 6"  (167.6 cm) Weight: 172 lb 13.5 oz (78.4 kg) IBW/kg (Calculated) : 63.8 Heparin Dosing Weight: 77 kg  Vital Signs: Temp: 99.1 F (37.3 C) (02/07 2000) Temp Source: Oral (02/07 2000) BP: 126/75 (02/07 2200) Pulse Rate: 76 (02/07 2300)  Labs: Recent Labs    07/09/19 1057 07/09/19 1113 07/09/19 1142 07/09/19 1354 07/09/19 1900 07/09/19 2233 07/10/19 0512 07/10/19 0953 07/10/19 2144  HGB 12.5*  --   --   --   --   --  11.3*  --   --   HCT 37.7*  --   --   --   --   --  34.3*  --   --   PLT 164  --   --   --   --   --  173  --   --   APTT  --  46*  --   --   --   --   --   --   --   LABPROT  --  15.7*  --   --   --   --   --   --   --   INR  --  1.3*  --   --   --   --   --   --   --   HEPARINUNFRC  --   --    < >  --   --  <0.10*  --  0.18* 0.10*  CREATININE 2.07*  --   --   --   --   --  2.39* 2.51*  --   TROPONINIHS 41*  --   --  51* 240*  --   --   --   --    < > = values in this interval not displayed.    Estimated Creatinine Clearance: 20 mL/min (A) (by C-G formula based on SCr of 2.51 mg/dL (H)).   Medical History: Past Medical History:  Diagnosis Date  . Absolute anemia 12/08/2014  . Allergy   . Anginal pain ([place])    much less angina d/t lack of activity  . Arthritis    all over body  . Bradycardia  05/03/2015  . Calculus of kidney 12/08/2014  . Coronary artery disease   . GERD (gastroesophageal reflux disease)   . Glaucoma   . History of kidney stones   . HLD (hyperlipidemia)   . Hyperglycemia 08/27/2015  . Hypertension   . Myocardial infarction ([place]) 2011  . Peripheral vascular disease ([place])   . VHD (valvular heart disease)    Assessment: 86 year old male presented with chest pain. No known prior anticoagulation. Patient to start on heparin drip. Pharmacy consulted.  2/6 2233 HL < 0.10, subtherapeutic.  Confirmed w/ RN no problems w/ infusion.  Goal of Therapy:  Heparin level 0.3-0.7 units/ml Monitor platelets by anticoagulation protocol: Yes   Plan:  2/7  2144 HL 0.10 subtherapeutic. Heparin 2000 unit bolus followed by increase in drip to 1500 units/hr. Recheck HL in 8 hours.  CBC daily while on heparin drip.  Ena Dawley, PharmD 07/10/2019,11:08 PM

## 2019-07-10 NOTE — Progress Notes (Signed)
*  PRELIMINARY RESULTS* Echocardiogram 2D Echocardiogram has been performed. Definity IV Contrast used on this study.  Austin [name] 07/10/2019, 8:21 AM

## 2019-07-11 LAB — PHOSPHORUS: Phosphorus: 5.1 mg/dL — ABNORMAL HIGH (ref 2.5–4.6)

## 2019-07-11 LAB — HEPARIN LEVEL (UNFRACTIONATED)
Heparin Unfractionated: 0.31 [IU]/mL (ref 0.30–0.70)
Heparin Unfractionated: 0.21 IU/mL — ABNORMAL LOW (ref 0.30–0.70)

## 2019-07-11 LAB — BASIC METABOLIC PANEL
Anion gap: 14 (ref 5–15)
BUN: 66 mg/dL — ABNORMAL HIGH (ref 8–23)
CO2: 21 mmol/L — ABNORMAL LOW (ref 22–32)
Calcium: 8 mg/dL — ABNORMAL LOW (ref 8.9–10.3)
Chloride: 94 mmol/L — ABNORMAL LOW (ref 98–111)
Creatinine, Ser: 3.01 mg/dL — ABNORMAL HIGH (ref 0.61–1.24)
GFR calc Af Amer: 20 mL/min — ABNORMAL LOW (ref >60)
GFR calc non Af Amer: 18 mL/min — ABNORMAL LOW (ref >60)
Glucose, Bld: 101 mg/dL — ABNORMAL HIGH (ref 70–99)
Potassium: 3.4 mmol/L — ABNORMAL LOW (ref 3.5–5.1)
Sodium: 129 mmol/L — ABNORMAL LOW (ref 135–145)

## 2019-07-11 LAB — BRAIN NATRIURETIC PEPTIDE: B Natriuretic Peptide: 337 pg/mL — ABNORMAL HIGH (ref 0.0–100.0)

## 2019-07-11 LAB — CBC
HCT: 34.2 % — ABNORMAL LOW (ref 39.0–52.0)
Hemoglobin: 11.6 g/dL — ABNORMAL LOW (ref 13.0–17.0)
MCH: 31 pg (ref 26.0–34.0)
MCHC: 33.9 g/dL (ref 30.0–36.0)
MCV: 91.4 fL (ref 80.0–100.0)
Platelets: 187 K/uL (ref 150–400)
RBC: 3.74 MIL/uL — ABNORMAL LOW (ref 4.22–5.81)
RDW: 13 % (ref 11.5–15.5)
WBC: 7.6 K/uL (ref 4.0–10.5)
nRBC: 0 % (ref 0.0–0.2)

## 2019-07-11 LAB — ECHOCARDIOGRAM COMPLETE
Height: 66 in
Weight: 2765.45 oz

## 2019-07-11 LAB — MAGNESIUM: Magnesium: 2.2 mg/dL (ref 1.7–2.4)

## 2019-07-11 MED ORDER — AMIODARONE LOAD VIA INFUSION
150.0000 mg | Freq: Once | INTRAVENOUS | Status: AC
  Administered 2019-07-11: 10:00:00 150 mg via INTRAVENOUS
  Filled 2019-07-11: qty 83.34

## 2019-07-11 MED ORDER — AMIODARONE HCL IN DEXTROSE 360-4.14 MG/200ML-% IV SOLN
60.0000 mg/h | INTRAVENOUS | Status: AC
  Administered 2019-07-11: 60 mg/h via INTRAVENOUS
  Filled 2019-07-11 (×2): qty 200

## 2019-07-11 MED ORDER — AMIODARONE HCL IN DEXTROSE 360-4.14 MG/200ML-% IV SOLN
30.0000 mg/h | INTRAVENOUS | Status: DC
  Administered 2019-07-11 (×2): 30 mg/h via INTRAVENOUS
  Filled 2019-07-11: qty 200

## 2019-07-11 MED ORDER — HEPARIN BOLUS VIA INFUSION
1200.0000 [IU] | INTRAVENOUS | Status: AC
  Administered 2019-07-11: 19:00:00 1200 [IU] via INTRAVENOUS
  Filled 2019-07-11: qty 1200

## 2019-07-11 MED ORDER — POTASSIUM CHLORIDE CRYS ER 20 MEQ PO TBCR
20.0000 meq | EXTENDED_RELEASE_TABLET | Freq: Once | ORAL | Status: AC
  Administered 2019-07-11: 10:00:00 20 meq via ORAL
  Filled 2019-07-11: qty 1

## 2019-07-11 NOTE — Progress Notes (Addendum)
Patient Name: Austin [name] Date of Encounter: 07/11/2019  Hospital Problem List     Principal Problem:   Coronary artery disease involving native coronary artery of native heart with unstable angina pectoris [facility]) Active Problems:   Benign essential HTN   Acid reflux   Glaucoma   Borderline diabetes   Atrial fibrillation [facility])    Patient Profile     86 y.o.malewith history ofCAD s/p CABG x 3 in 2007 with LIMA to LAD, SVG to OM1, and PDA (cath from 2016 with 3/3 functional grafts), mild aortic stenosis, HTN, HLD, and carotid artery stenosis (40-59% to bilateral ICAs), followed by Dr. Gabriel Carina presents emergency room today with chest pain that started approximately 3 AM today. He describes nausea, diaphoresis and shortness of breath. Pain was 7 on 10 on presentation which improved with sublingual nitroglycerin. Stated his pain was similar to his angina. EKG in the emergency room revealed ST depression in the lateral leads. Initial troponin was 41 with a subsequent value of 51. He was started on heparin. BNP was 304. Creatinine was 2.07 with a baseline of approximately 1.5.    Echo showed near normal LV function EF 50 to 55% with no obvious regional wall motion abnormality.  Sclerotic to minimally stenotic aortic valve with no significant AI MR or TR.  Respiratory status slowly improving.  Mild troponin bump.  BNP improved this a.m. to 337.  Renal function continues to decline.  Likely ATN.  Subjective   Less short of breath.  Still gets dyspneic when trying to do much exertion.  On nasal cannula oxygen.  Hemodynamically stable.  Still in atrial fibrillation with controlled ventricular response.  Inpatient Medications    . aspirin EC  81 mg Oral Daily  . atorvastatin  80 mg Oral q1800  . chlorhexidine  15 mL Mouth Rinse BID  . Chlorhexidine Gluconate Cloth  6 each Topical Daily  . dorzolamide-timolol  1 drop Both Eyes BID  . furosemide  40 mg Intravenous Q6H  .  latanoprost  1 drop Both Eyes QHS  . mouth rinse  15 mL Mouth Rinse q12n4p  . metoprolol tartrate  12.5 mg Oral BID  . multivitamin-lutein  1 capsule Oral BID  . omega-3 acid ethyl esters  1 g Oral BID  . pantoprazole (PROTONIX) IV  40 mg Intravenous Q12H  . sulfamethoxazole-trimethoprim  1 tablet Oral Q12H  . tamsulosin  0.4 mg Oral Daily    Vital Signs    Vitals:   07/11/19 0300 07/11/19 0400 07/11/19 0500 07/11/19 0600  BP: (!) 101/57 (!) 93/49 119/65 125/61  Pulse: (!) 25 70 (!) 59 75  Resp: 17 17 (!) 32 (!) 22  Temp:  97.6 F (36.4 C)    TempSrc:  Oral    SpO2: 93% 91% 93% 100%  Weight:   76.9 kg   Height:        Intake/Output Summary (Last 24 hours) at 07/11/2019 0758 Last data filed at 07/11/2019 0600 Gross per 24 hour  Intake 368.68 ml  Output 2100 ml  Net -1731.32 ml   Filed Weights   07/09/19 1523 07/10/19 0126 07/11/19 0500  Weight: 78.4 kg 78.4 kg 76.9 kg    Physical Exam    GEN: Well nourished, well developed, in no acute distress.  HEENT: normal.  Neck: Supple, no JVD, carotid bruits, or masses. Cardiac: Irregular regular rhythm Respiratory:  Respirations regular and unlabored, clear to auscultation bilaterally. GI: Soft, nontender, nondistended, BS + x 4. MS:  no deformity or atrophy. Skin: warm and dry, no rash. Neuro:  Strength and sensation are intact. Psych: Normal affect.  Labs    CBC Recent Labs    07/10/19 0512 07/11/19 0546  WBC 9.2 7.6  HGB 11.3* 11.6*  HCT 34.3* 34.2*  MCV 92.5 91.4  PLT 173 782   Basic Metabolic Panel Recent Labs    07/10/19 0953 07/11/19 0546  NA 135 129*  K 4.1 3.4*  CL 101 94*  CO2 21* 21*  GLUCOSE 101* 101*  BUN 48* 66*  CREATININE 2.51* 3.01*  CALCIUM 8.4* 8.0*   Liver Function Tests Recent Labs    07/09/19 1057  AST 22  ALT 24  ALKPHOS 82  BILITOT 1.9*  PROT 7.8  ALBUMIN 4.1   No results for input(s): LIPASE, AMYLASE in the last 72 hours. Cardiac Enzymes No results for input(s):  CKTOTAL, CKMB, CKMBINDEX, TROPONINI in the last 72 hours. BNP Recent Labs    07/09/19 1116 07/10/19 0512 07/11/19 0546  BNP 304.0* 537.0* 337.0*   D-Dimer No results for input(s): DDIMER in the last 72 hours. Hemoglobin A1C No results for input(s): HGBA1C in the last 72 hours. Fasting Lipid Panel No results for input(s): CHOL, HDL, LDLCALC, TRIG, CHOLHDL, LDLDIRECT in the last 72 hours. Thyroid Function Tests No results for input(s): TSH, T4TOTAL, T3FREE, THYROIDAB in the last 72 hours.  Invalid input(s): FREET3  Telemetry    Atrial fibrillation with controlled ventricular response  ECG    A. fib with variable ventricular response  Radiology    DG Chest Port 1 View  Result Date: 07/10/2019 CLINICAL DATA:  Chest pain and shortness of breath EXAM: PORTABLE CHEST 1 VIEW COMPARISON:  07/09/2019 FINDINGS: Cardiomegaly and CABG changes again noted. Bilateral interstitial and airspace opacities, RIGHT greater than LEFT, are slightly improved. There may be trace bilateral pleural effusions present. No pneumothorax. IMPRESSION: Slightly improved bilateral interstitial and airspace opacities, RIGHT greater than LEFT. Electronically Signed   By: Margarette Canada M.D.   On: 07/10/2019 10:58   DG Chest Port 1 View  Result Date: 07/09/2019 CLINICAL DATA:  Chest pain EXAM: PORTABLE CHEST 1 VIEW COMPARISON:  07/09/2019 FINDINGS: Bilateral airspace disease again noted, worsening on the right since prior study with slight improvement on the left. Cardiomegaly. Suspect small right pleural effusion. No acute bony abnormality. IMPRESSION: Bilateral airspace disease, improving on the left and worsening on the right. This could reflect asymmetric edema or infection. Small right effusion. Electronically Signed   By: Rolm Baptise M.D.   On: 07/09/2019 17:15   DG Chest Port 1 View  Result Date: 07/09/2019 CLINICAL DATA:  Chest pain. EXAM: PORTABLE CHEST 1 VIEW COMPARISON:  August 23, 2010. FINDINGS: Stable  cardiomegaly. Status post coronary bypass graft. Interstitial densities are noted throughout both lungs most consistent with pulmonary edema. No pneumothorax or pleural effusion is noted. Bony thorax is unremarkable. IMPRESSION: Findings consistent with bilateral pulmonary edema. Electronically Signed   By: Marijo Conception M.D.   On: 07/09/2019 11:35   ECHOCARDIOGRAM COMPLETE  Result Date: 07/11/2019   ECHOCARDIOGRAM REPORT   Patient Name:   Austin [name] Date of Exam: 07/10/2019 Medical Rec #:  [id number]        Height:       66.0 in Accession #:    [id number]       Weight:       172.8 lb Date of Birth:  07-Jun-1928         BSA:  1.88 m Patient Age:    79 years         BP:           140/60 mmHg Patient Gender: M                HR:           66 bpm. Exam Location:  [facility] Procedure: 2D Echo and Intracardiac Opacification Agent Indications:     NSTEMI I21.4  History:         Patient has no prior history of Echocardiogram examinations.  Sonographer:     Arville Go RDCS Referring Phys:  [place] Diagnosing Phys: Bartholome Bill MD  Sonographer Comments: Technically difficult study due to poor echo windows. IMPRESSIONS  1. Left ventricular ejection fraction, by visual estimation, is 50 to 55%. The left ventricle has low normal function. Left ventricular septal wall thickness was mildly increased. Mildly increased left ventricular posterior wall thickness. There is mildly increased left ventricular hypertrophy.  2. Definity contrast agent was given IV to delineate the left ventricular endocardial borders.  3. Left ventricular diastolic parameters are consistent with Grade I diastolic dysfunction (impaired relaxation).  4. The left ventricle has no regional wall motion abnormalities.  5. Global right ventricle has normal systolic function.The right ventricular size is normal. No increase in right ventricular wall thickness.  6. Left atrial size was mildly dilated.  7. Right atrial size was normal.  8.  The mitral valve is degenerative. Mild mitral valve regurgitation.  9. The tricuspid valve is not well visualized. 10. The tricuspid valve is not well visualized. Tricuspid valve regurgitation is trivial. 11. The aortic valve is abnormal. Aortic valve regurgitation is trivial. Mild to moderate aortic valve sclerosis/calcification without any evidence of aortic stenosis. 12. The pulmonic valve was not well visualized. Pulmonic valve regurgitation is trivial. 13. The atrial septum is grossly normal. FINDINGS  Left Ventricle: Left ventricular ejection fraction, by visual estimation, is 50 to 55%. The left ventricle has low normal function. Definity contrast agent was given IV to delineate the left ventricular endocardial borders. The left ventricle has no regional wall motion abnormalities. Mildly increased left ventricular posterior wall thickness. There is mildly increased left ventricular hypertrophy. Left ventricular diastolic parameters are consistent with Grade I diastolic dysfunction (impaired relaxation). Right Ventricle: The right ventricular size is normal. No increase in right ventricular wall thickness. Global RV systolic function is has normal systolic function. Left Atrium: Left atrial size was mildly dilated. Right Atrium: Right atrial size was normal in size Pericardium: There is no evidence of pericardial effusion. Mitral Valve: The mitral valve is degenerative in appearance. Mild mitral valve regurgitation. Tricuspid Valve: The tricuspid valve is not well visualized. Tricuspid valve regurgitation is trivial. Aortic Valve: The aortic valve is abnormal. Aortic valve regurgitation is trivial. Mild to moderate aortic valve sclerosis/calcification is present, without any evidence of aortic stenosis. Aortic valve mean gradient measures 9.0 mmHg. Aortic valve peak gradient measures 17.1 mmHg. Aortic valve area, by VTI measures 1.73 cm. Pulmonic Valve: The pulmonic valve was not well visualized. Pulmonic  valve regurgitation is trivial. Pulmonic regurgitation is trivial. Aorta: The aortic root is normal in size and structure. IAS/Shunts: The atrial septum is grossly normal.  LEFT VENTRICLE PLAX 2D LVIDd:         5.46 cm       Diastology LVIDs:         4.32 cm       LV e' lateral:  9.79 cm/s LV PW:         1.08 cm       LV E/e' lateral: 13.0 LV IVS:        1.43 cm LVOT diam:     2.10 cm LV SV:         61 ml LV SV Index:   31.60 LVOT Area:     3.46 cm  LV Volumes (MOD) LV area d, A2C:    38.40 cm LV area d, A4C:    28.20 cm LV area s, A2C:    27.50 cm LV area s, A4C:    17.70 cm LV major d, A2C:   7.94 cm LV major d, A4C:   6.99 cm LV major s, A2C:   7.12 cm LV major s, A4C:   6.66 cm LV vol d, MOD A2C: 150.0 ml LV vol d, MOD A4C: 92.8 ml LV vol s, MOD A2C: 85.6 ml LV vol s, MOD A4C: 38.3 ml LV SV MOD A2C:     64.4 ml LV SV MOD A4C:     92.8 ml LV SV MOD BP:      66.5 ml RIGHT VENTRICLE RV Basal diam:  2.63 cm RV S prime:     8.27 cm/s TAPSE (M-mode): 1.3 cm LEFT ATRIUM             Index       RIGHT ATRIUM           Index LA diam:        4.80 cm 2.55 cm/m  RA Area:     16.20 cm LA Vol (A2C):   73.7 ml 39.21 ml/m RA Volume:   40.10 ml  21.34 ml/m LA Vol (A4C):   40.9 ml 21.76 ml/m LA Biplane Vol: 57.6 ml 30.65 ml/m  AORTIC VALVE                    PULMONIC VALVE AV Area (Vmax):    1.56 cm     PV Vmax:       1.09 m/s AV Area (Vmean):   1.61 cm     PV Peak grad:  4.8 mmHg AV Area (VTI):     1.73 cm AV Vmax:           207.00 cm/s AV Vmean:          137.000 cm/s AV VTI:            0.374 m AV Peak Grad:      17.1 mmHg AV Mean Grad:      9.0 mmHg LVOT Vmax:         93.20 cm/s LVOT Vmean:        63.500 cm/s LVOT VTI:          0.187 m LVOT/AV VTI ratio: 0.50  AORTA Ao Root diam: 2.60 cm Ao Asc diam:  3.50 cm MITRAL VALVE MV Area (PHT): 3.60 cm             SHUNTS MV PHT:        61.19 msec           Systemic VTI:  0.19 m MV Decel Time: 211 msec             Systemic Diam: 2.10 cm MV E velocity: 127.00 cm/s 103 cm/s   Bartholome Bill MD Electronically signed by Bartholome Bill MD Signature Date/Time: 07/11/2019/7:24:55 AM    Final     Assessment & Plan  86 y.o.malewith history ofCAD s/p CABG x 3 in 2007 with LIMA to LAD, SVG to OM1, and PDA (cath from 2016 with 3/3 functional grafts), mild aortic stenosis, HTN, HLD, and carotid artery stenosis (40-59% to bilateral ICAs), followed by Dr. Juanetta Gosling presented to the ER with chest pain starting at 3 AM this morning. EKG showed ST depression in the lateral leads which was somewhat more normal than his baseline.  Currently shows atrial fibrillation with controlled ventricular responseinitial troponin was 41 followed by 51 with peaked at 240..  1.  Coronary artery disease              Status post coronary artery bypass grafting x3 in 2007 with a LIMA to the LAD, vein graft to OM1 and PDA.  Cardiac cath in 2016 revealed 3 functional grasp with mild aortic stenosis.  Presented with chest tightness shortness of breath and hypoxia.  Had a mild troponin elevation peaking at 240.  Has no chest pain at present.  No ischemic changes on electrocardiogram.  Likely will need consideration for invasive evaluation prior to discharge however will need to optimize pulmonary function, renal function before consideration of this.  Would continue with heparin IV, control rate with metoprolol tartrate 12.5 mg twice daily for now. .  We will continue with IV nitroglycerin to titrate pressure and for afterload reduction.  Continue with atorvastatin and aspirin.  Likely not an acute coronary event but demand ischemia with limited coronary reserve and at least mild aortic stenosis.  We will continue to follow defer any consideration for invasive evaluation until renal function stabilizes.  We will continue with heparin.  We will continue with aspirin, atorvastatin.  May need to hold beta-blocker while starting amiodarone if rate declines.  Hemodynamically stable.  2.  Respiratory failure               Oxygenation improving. We will continue to diurese following renal function, chest x-ray, hemodynamics and electrolytes.  BNP improved somewhat to 337 down from 537.  Appreciate Dr. Teodoro Kil care  3.  Acute on chronic renal insufficiency              Renal function decreased further. Diuresing fairly well and is -3.2 L since admission.  Will need to follow renal function.  Creatinine 3.01 this morning with a GFR of 18 down from 28 on  admission.  Likely acute on chronic kidney injury due to hypoxia and hemodynamics with events of yesterday.  We will continue to follow and adjust renally cleared drugs as needed.   4.  Atrial fibrillation              Apparently new.  May have begun prior to admission and loss of atrial kick may have exacerbated his clinical decline.  Will try to load with amiodarone to see if we can convert him back to normal rhythm which hopefully will improve his filling.  Prior to discharge will need chronic anticoagulation .    Will carefully loaded with amiodarone.  5.  Congestive heart failure              Echo revealed fairly preserved LV function EF around 50% globally with mild LVH.  A sclerotic to mildly stenotic aortic valve.  No obvious wall motion abnormality.  We will continue to carefully diurese.  EF about 50% so we will continue with metoprolol tartrate.  Would not afterload reduced at present given preserved LV function and acute renal insufficiency on last directed by nephrology.  Signed, Javier Docker Giovonni Poirier MD 07/11/2019, 7:58 AM  Pager: [phone]

## 2019-07-11 NOTE — Progress Notes (Signed)
[place] for heparin Indication: chest pain/ACS  Allergies  Allergen Reactions  . Penicillins Itching, Swelling, Rash and Other (See Comments)    Has patient had a PCN reaction causing immediate rash, facial/tongue/throat swelling, SOB or lightheadedness with hypotension: Yes Has patient had a PCN reaction causing severe rash involving mucus membranes or skin necrosis: No Has patient had a PCN reaction that required hospitalization No Has patient had a PCN reaction occurring within the last 10 years: No If all of the above answers are "NO", then may proceed with Cephalosporin use.     Patient Measurements: Height: 5\' 6"  (167.6 cm) Weight: 169 lb 8.5 oz (76.9 kg) IBW/kg (Calculated) : 63.8 Heparin Dosing Weight: 77 kg  Vital Signs: Temp: 98.2 F (36.8 C) (02/08 1200) Temp Source: Oral (02/08 0400) BP: 115/77 (02/08 1200) Pulse Rate: 72 (02/08 1200)  Labs: Recent Labs     0000 07/09/19 1057 07/09/19 1113 07/09/19 1142 07/09/19 1354 07/09/19 1900 07/09/19 2233 07/10/19 0512 07/10/19 0953 07/10/19 2144 07/11/19 0546  HGB   < > 12.5*  --   --   --   --   --  11.3*  --   --  11.6*  HCT  --  37.7*  --   --   --   --   --  34.3*  --   --  34.2*  PLT  --  164  --   --   --   --   --  173  --   --  187  APTT  --   --  46*  --   --   --   --   --   --   --   --   LABPROT  --   --  15.7*  --   --   --   --   --   --   --   --   INR  --   --  1.3*  --   --   --   --   --   --   --   --   HEPARINUNFRC  --   --   --    < >  --   --    < >  --  0.18* 0.10* 0.31  CREATININE   < > 2.07*  --   --   --   --   --  2.39* 2.51*  --  3.01*  TROPONINIHS  --  41*  --   --  51* 240*  --   --   --   --   --    < > = values in this interval not displayed.    Estimated Creatinine Clearance: 16.6 mL/min (A) (by C-G formula based on SCr of 3.01 mg/dL (H)).   Medical History: Past Medical History:  Diagnosis Date  . Absolute anemia 12/08/2014  .  Allergy   . Anginal pain ([place])    much less angina d/t lack of activity  . Arthritis    all over body  . Bradycardia 05/03/2015  . Calculus of kidney 12/08/2014  . Coronary artery disease   . GERD (gastroesophageal reflux disease)   . Glaucoma   . History of kidney stones   . HLD (hyperlipidemia)   . Hyperglycemia 08/27/2015  . Hypertension   . Myocardial infarction ([place]) 2011  . Peripheral vascular disease ([place])   . VHD (valvular heart disease)    Assessment: Pharmacy consulted  for heparin drip management for 86 yo male admitted with history of chest pain over past several days. Patient with past medical history significant for CAD, CABG, hypertension and anemia. Patient is not on anticoagulation as an outpatient. Patient with new onset atrial fibrillation and CHF. Patient with CHA2DS2-VASc Score of at least 4 (CHF, Age > 66, HTN). Heparin is infusing at 1500 units/hr.   Goal of Therapy:  Heparin level 0.3-0.7 units/ml Monitor platelets by anticoagulation protocol: Yes   Plan:  Heparin level in range. Will continue patient on heparin 1500 units/hr. Will obtain confirmatory level at 1600. CBC with am labs.   Pharmacy will continue to monitor and adjust per consult.    Kristen Fromm L, RPh 07/11/2019,3:42 PM

## 2019-07-11 NOTE — Progress Notes (Signed)
PROGRESS NOTE    Austin [name]  QQV:[id number] DOB: 1929-03-12 DOA: 07/09/2019 PCP: Jerrol Banana., MD    Assessment & Plan:   Principal Problem:   Coronary artery disease involving native coronary artery of native heart with unstable angina pectoris [facility]) Active Problems:   Benign essential HTN   Acid reflux   Glaucoma   Borderline diabetes   NSTEMI (non-ST elevated myocardial infarction) (HCC)   Atrial fibrillation (HCC)    Acute Hypoxic Respiratory Failure -due to florid pulmonary edema secondary to acute on chronic CHF as well as renal insufficiency.  Weaned off BiPAP, to 2L today. -TTE during this admission showed relatively normal LVEF and right heart function. -continue Bronchodilator Therapy --Hold diuretic today since Cr trending up --Strict I/O  Non-ST elevation MI -History of severe CAD status post CABG -Cardiology on case-appreciate input --continue heparin gtt -oxygen as needed --further ischemic eval per cards.  Renal Failure-acute on chronic stage II --Cr 2.07 on presentation, trending up to 3.01 today. --Hold diuretic for now   DVT prophylaxis: Heparin SQ Code Status: Full code  Family Communication: wife updated at bedside Disposition Plan: Likely home, but needs to see improvement in Cr.   Subjective and Interval History:  Pt reported breathing felt about the same, however, was down to 2L from 4L yesterday.  No fever, chest pain, abdominal pain, N/V/D, dysuria, increased swelling.   Objective: Vitals:   07/11/19 1600 07/11/19 1700 07/11/19 1807 07/11/19 1831  BP: 99/61 120/76 129/60   Pulse: (!) 56 68 69   Resp: 20 (!) 29 17   Temp: 97.8 F (36.6 C)  98.1 F (36.7 C)   TempSrc:   Oral   SpO2: 91% 99% 92%   Weight:    76.7 kg  Height:        Intake/Output Summary (Last 24 hours) at 07/11/2019 1935 Last data filed at 07/11/2019 1819 Gross per 24 hour  Intake 1282.83 ml  Output 2800 ml  Net -1517.17 ml   Filed Weights    07/10/19 0126 07/11/19 0500 07/11/19 1831  Weight: 78.4 kg 76.9 kg 76.7 kg    Examination:   Constitutional: NAD, AAOx3 HEENT: conjunctivae and lids normal, EOMI CV: irregularly irregular no M,R,G. Distal pulses +2.  No cyanosis.   RESP: No obvious crackles, normal respiratory effort, on 2L, but easily desat with talking GI: +BS, NTND Extremities: No effusions, edema, or tenderness in BLE SKIN: warm, dry and intact Neuro: II - XII grossly intact.  Sensation intact Psych: Normal mood and affect.  Appropriate judgement and reason   Data Reviewed: I have personally reviewed following labs and imaging studies  CBC: Recent Labs  Lab 07/09/19 1057 07/10/19 0512 07/11/19 0546  WBC 10.0 9.2 7.6  HGB 12.5* 11.3* 11.6*  HCT 37.7* 34.3* 34.2*  MCV 92.9 92.5 91.4  PLT 164 173 332   Basic Metabolic Panel: Recent Labs  Lab 07/09/19 1057 07/10/19 0512 07/10/19 0953 07/11/19 0546  NA 135 136 135 129*  K 4.2 4.0 4.1 3.4*  CL 102 102 101 94*  CO2 18* 22 21* 21*  GLUCOSE 137* 118* 101* 101*  BUN 41* 45* 48* 66*  CREATININE 2.07* 2.39* 2.51* 3.01*  CALCIUM 9.1 8.3* 8.4* 8.0*  MG  --   --   --  2.2  PHOS  --   --   --  5.1*   GFR: Estimated Creatinine Clearance: 16.6 mL/min (A) (by C-G formula based on SCr of 3.01 mg/dL (H)). Liver  Function Tests: Recent Labs  Lab 07/09/19 1057  AST 22  ALT 24  ALKPHOS 82  BILITOT 1.9*  PROT 7.8  ALBUMIN 4.1   No results for input(s): LIPASE, AMYLASE in the last 168 hours. No results for input(s): AMMONIA in the last 168 hours. Coagulation Profile: Recent Labs  Lab 07/09/19 1113  INR 1.3*   Cardiac Enzymes: No results for input(s): CKTOTAL, CKMB, CKMBINDEX, TROPONINI in the last 168 hours. BNP (last 3 results) No results for input(s): PROBNP in the last 8760 hours. HbA1C: No results for input(s): HGBA1C in the last 72 hours. CBG: Recent Labs  Lab 07/09/19 1524  GLUCAP 141*   Lipid Profile: No results for input(s): CHOL,  HDL, LDLCALC, TRIG, CHOLHDL, LDLDIRECT in the last 72 hours. Thyroid Function Tests: No results for input(s): TSH, T4TOTAL, FREET4, T3FREE, THYROIDAB in the last 72 hours. Anemia Panel: No results for input(s): VITAMINB12, FOLATE, FERRITIN, TIBC, IRON, RETICCTPCT in the last 72 hours. Sepsis Labs: Recent Labs  Lab 07/09/19 1900  PROCALCITON 0.18    Recent Results (from the past 240 hour(s))  Respiratory Panel by RT PCR (Flu A&B, Covid) - Nasopharyngeal Swab     Status: None   Collection Time: 07/09/19 11:42 AM   Specimen: Nasopharyngeal Swab  Result Value Ref Range Status   SARS Coronavirus 2 by RT PCR NEGATIVE NEGATIVE Final    Comment: (NOTE) SARS-CoV-2 target nucleic acids are NOT DETECTED. The SARS-CoV-2 RNA is generally detectable in upper respiratoy specimens during the acute phase of infection. The lowest concentration of SARS-CoV-2 viral copies this assay can detect is 131 copies/mL. A negative result does not preclude SARS-Cov-2 infection and should not be used as the sole basis for treatment or other patient management decisions. A negative result may occur with  improper specimen collection/handling, submission of specimen other than nasopharyngeal swab, presence of viral mutation(s) within the areas targeted by this assay, and inadequate number of viral copies (<131 copies/mL). A negative result must be combined with clinical observations, patient history, and epidemiological information. The expected result is Negative. Fact Sheet for Patients:  PinkCheek.be Fact Sheet for Healthcare Providers:  GravelBags.it This test is not yet ap proved or cleared by the [place] FDA and  has been authorized for detection and/or diagnosis of SARS-CoV-2 by FDA under an Emergency Use Authorization (EUA). This EUA will remain  in effect (meaning this test can be used) for the duration of the COVID-19 declaration under  Section 564(b)(1) of the Act, 21 U.S.C. section 360bbb-3(b)(1), unless the authorization is terminated or revoked sooner.    Influenza A by PCR NEGATIVE NEGATIVE Final   Influenza B by PCR NEGATIVE NEGATIVE Final    Comment: (NOTE) The Xpert Xpress SARS-CoV-2/FLU/RSV assay is intended as an aid in  the diagnosis of influenza from Nasopharyngeal swab specimens and  should not be used as a sole basis for treatment. Nasal washings and  aspirates are unacceptable for Xpert Xpress SARS-CoV-2/FLU/RSV  testing. Fact Sheet for Patients: PinkCheek.be Fact Sheet for Healthcare Providers: GravelBags.it This test is not yet approved or cleared by the [place] FDA and  has been authorized for detection and/or diagnosis of SARS-CoV-2 by  FDA under an Emergency Use Authorization (EUA). This EUA will remain  in effect (meaning this test can be used) for the duration of the  Covid-19 declaration under Section 564(b)(1) of the Act, 21  U.S.C. section 360bbb-3(b)(1), unless the authorization is  terminated or revoked. Performed at [facility], [place]  [address] [place], [place] [address]   MRSA PCR Screening     Status: None   Collection Time: 07/09/19  3:29 PM   Specimen: Nasopharyngeal  Result Value Ref Range Status   MRSA by PCR NEGATIVE NEGATIVE Final    Comment:        The GeneXpert MRSA Assay (FDA approved for NASAL specimens only), is one component of a comprehensive MRSA colonization surveillance program. It is not intended to diagnose MRSA infection nor to guide or monitor treatment for MRSA infections. Performed at [facility], [address] [place], [place] [address]   CULTURE, BLOOD (ROUTINE X 2) w Reflex to ID Panel     Status: None (Preliminary result)   Collection Time: 07/09/19  7:00 PM   Specimen: BLOOD  Result Value Ref Range Status   Specimen Description BLOOD LEFT HAND  Final   Special  Requests   Final    BOTTLES DRAWN AEROBIC AND ANAEROBIC Blood Culture adequate volume   Culture   Final    NO GROWTH 2 DAYS Performed at [facility], [address] [place], [place] [address]    Report Status PENDING  Incomplete  CULTURE, BLOOD (ROUTINE X 2) w Reflex to ID Panel     Status: None (Preliminary result)   Collection Time: 07/09/19  7:15 PM   Specimen: BLOOD  Result Value Ref Range Status   Specimen Description BLOOD LEFT ANTECUBITAL  Final   Special Requests   Final    BOTTLES DRAWN AEROBIC AND ANAEROBIC Blood Culture adequate volume   Culture   Final    NO GROWTH 2 DAYS Performed at [facility], [address], [place], [place] [address]    Report Status PENDING  Incomplete      Radiology Studies: DG Chest Port 1 View  Result Date: 07/10/2019 CLINICAL DATA:  Chest pain and shortness of breath EXAM: PORTABLE CHEST 1 VIEW COMPARISON:  07/09/2019 FINDINGS: Cardiomegaly and CABG changes again noted. Bilateral interstitial and airspace opacities, RIGHT greater than LEFT, are slightly improved. There may be trace bilateral pleural effusions present. No pneumothorax. IMPRESSION: Slightly improved bilateral interstitial and airspace opacities, RIGHT greater than LEFT. Electronically Signed   By: Margarette Canada M.D.   On: 07/10/2019 10:58   ECHOCARDIOGRAM COMPLETE  Result Date: 07/11/2019   ECHOCARDIOGRAM REPORT   Patient Name:   DESTINY TRICKEY Date of Exam: 07/10/2019 Medical Rec #:  [id number]        Height:       66.0 in Accession #:    [id number]       Weight:       172.8 lb Date of Birth:  1928/12/26         BSA:          1.88 m Patient Age:    86 years         BP:           140/60 mmHg Patient Gender: M                HR:           66 bpm. Exam Location:  [facility] Procedure: 2D Echo and Intracardiac Opacification Agent Indications:     NSTEMI I21.4  History:         Patient has no prior history of Echocardiogram examinations.  Sonographer:     Arville Go RDCS  Referring Phys:  [place] Diagnosing Phys: Bartholome Bill MD  Sonographer Comments:  Technically difficult study due to poor echo windows. IMPRESSIONS  1. Left ventricular ejection fraction, by visual estimation, is 50 to 55%. The left ventricle has low normal function. Left ventricular septal wall thickness was mildly increased. Mildly increased left ventricular posterior wall thickness. There is mildly increased left ventricular hypertrophy.  2. Definity contrast agent was given IV to delineate the left ventricular endocardial borders.  3. Left ventricular diastolic parameters are consistent with Grade I diastolic dysfunction (impaired relaxation).  4. The left ventricle has no regional wall motion abnormalities.  5. Global right ventricle has normal systolic function.The right ventricular size is normal. No increase in right ventricular wall thickness.  6. Left atrial size was mildly dilated.  7. Right atrial size was normal.  8. The mitral valve is degenerative. Mild mitral valve regurgitation.  9. The tricuspid valve is not well visualized. 10. The tricuspid valve is not well visualized. Tricuspid valve regurgitation is trivial. 11. The aortic valve is abnormal. Aortic valve regurgitation is trivial. Mild to moderate aortic valve sclerosis/calcification without any evidence of aortic stenosis. 12. The pulmonic valve was not well visualized. Pulmonic valve regurgitation is trivial. 13. The atrial septum is grossly normal. FINDINGS  Left Ventricle: Left ventricular ejection fraction, by visual estimation, is 50 to 55%. The left ventricle has low normal function. Definity contrast agent was given IV to delineate the left ventricular endocardial borders. The left ventricle has no regional wall motion abnormalities. Mildly increased left ventricular posterior wall thickness. There is mildly increased left ventricular hypertrophy. Left ventricular diastolic parameters are consistent with Grade I diastolic  dysfunction (impaired relaxation). Right Ventricle: The right ventricular size is normal. No increase in right ventricular wall thickness. Global RV systolic function is has normal systolic function. Left Atrium: Left atrial size was mildly dilated. Right Atrium: Right atrial size was normal in size Pericardium: There is no evidence of pericardial effusion. Mitral Valve: The mitral valve is degenerative in appearance. Mild mitral valve regurgitation. Tricuspid Valve: The tricuspid valve is not well visualized. Tricuspid valve regurgitation is trivial. Aortic Valve: The aortic valve is abnormal. Aortic valve regurgitation is trivial. Mild to moderate aortic valve sclerosis/calcification is present, without any evidence of aortic stenosis. Aortic valve mean gradient measures 9.0 mmHg. Aortic valve peak gradient measures 17.1 mmHg. Aortic valve area, by VTI measures 1.73 cm. Pulmonic Valve: The pulmonic valve was not well visualized. Pulmonic valve regurgitation is trivial. Pulmonic regurgitation is trivial. Aorta: The aortic root is normal in size and structure. IAS/Shunts: The atrial septum is grossly normal.  LEFT VENTRICLE PLAX 2D LVIDd:         5.46 cm       Diastology LVIDs:         4.32 cm       LV e' lateral:   9.79 cm/s LV PW:         1.08 cm       LV E/e' lateral: 13.0 LV IVS:        1.43 cm LVOT diam:     2.10 cm LV SV:         61 ml LV SV Index:   31.60 LVOT Area:     3.46 cm  LV Volumes (MOD) LV area d, A2C:    38.40 cm LV area d, A4C:    28.20 cm LV area s, A2C:    27.50 cm LV area s, A4C:    17.70 cm LV major d, A2C:   7.94 cm LV major d,  A4C:   6.99 cm LV major s, A2C:   7.12 cm LV major s, A4C:   6.66 cm LV vol d, MOD A2C: 150.0 ml LV vol d, MOD A4C: 92.8 ml LV vol s, MOD A2C: 85.6 ml LV vol s, MOD A4C: 38.3 ml LV SV MOD A2C:     64.4 ml LV SV MOD A4C:     92.8 ml LV SV MOD BP:      66.5 ml RIGHT VENTRICLE RV Basal diam:  2.63 cm RV S prime:     8.27 cm/s TAPSE (M-mode): 1.3 cm LEFT ATRIUM              Index       RIGHT ATRIUM           Index LA diam:        4.80 cm 2.55 cm/m  RA Area:     16.20 cm LA Vol (A2C):   73.7 ml 39.21 ml/m RA Volume:   40.10 ml  21.34 ml/m LA Vol (A4C):   40.9 ml 21.76 ml/m LA Biplane Vol: 57.6 ml 30.65 ml/m  AORTIC VALVE                    PULMONIC VALVE AV Area (Vmax):    1.56 cm     PV Vmax:       1.09 m/s AV Area (Vmean):   1.61 cm     PV Peak grad:  4.8 mmHg AV Area (VTI):     1.73 cm AV Vmax:           207.00 cm/s AV Vmean:          137.000 cm/s AV VTI:            0.374 m AV Peak Grad:      17.1 mmHg AV Mean Grad:      9.0 mmHg LVOT Vmax:         93.20 cm/s LVOT Vmean:        63.500 cm/s LVOT VTI:          0.187 m LVOT/AV VTI ratio: 0.50  AORTA Ao Root diam: 2.60 cm Ao Asc diam:  3.50 cm MITRAL VALVE MV Area (PHT): 3.60 cm             SHUNTS MV PHT:        61.19 msec           Systemic VTI:  0.19 m MV Decel Time: 211 msec             Systemic Diam: 2.10 cm MV E velocity: 127.00 cm/s 103 cm/s  Bartholome Bill MD Electronically signed by Bartholome Bill MD Signature Date/Time: 07/11/2019/7:24:55 AM    Final      Scheduled Meds: . aspirin EC  81 mg Oral Daily  . atorvastatin  80 mg Oral q1800  . chlorhexidine  15 mL Mouth Rinse BID  . Chlorhexidine Gluconate Cloth  6 each Topical Daily  . dorzolamide-timolol  1 drop Both Eyes BID  . latanoprost  1 drop Both Eyes QHS  . mouth rinse  15 mL Mouth Rinse q12n4p  . metoprolol tartrate  12.5 mg Oral BID  . multivitamin-lutein  1 capsule Oral BID  . omega-3 acid ethyl esters  1 g Oral BID  . pantoprazole (PROTONIX) IV  40 mg Intravenous Q12H  . sulfamethoxazole-trimethoprim  1 tablet Oral Q12H  . tamsulosin  0.4 mg Oral Daily   Continuous Infusions: .  amiodarone 30 mg/hr (07/11/19 1500)  . heparin 1,650 Units/hr (07/11/19 1844)  . nitroGLYCERIN Stopped (07/10/19 0516)     LOS: 2 days     Enzo Bi, MD [facility] If 7PM-7AM, please contact night-coverage 07/11/2019, 7:35 PM

## 2019-07-11 NOTE — Progress Notes (Signed)
Quiet day. Dr. Ubaldo Glassing in early. Patient started on Amiodarone for A. Fib. Changed from rate of 60mg /hour to 30mg /hour at 1500. No complaints of chest pain or discomfort. Heparin infusing at 1500 units per hour. Wife in to visit.

## 2019-07-11 NOTE — Progress Notes (Signed)
CRITICAL CARE PROGRESS NOTE    Name: Austin [name] MRN: [id number] DOB: [birth year]     LOS: 2   SUBJECTIVE FINDINGS & SIGNIFICANT EVENTS   Patient description:  86 year old male with history of severe CAD status post CABG, essential hypertension, chronic anemia and came into the hospital for severe chest pain.  He also states 3 days of worsening dyspnea.  Was admitted by hospitalist service to stepdown for possible non-STEMI and cardiology consultation.  When patient came into stepdown unit he started complaining of worsening chest pain and noted to have severe desaturation with cyanosis on clinical exam.  Critical care consultation was placed due to patient clinically declining with hypoxemia and worsening chest pain    Lines / Drains: Peripheral IV x2  Cultures / Sepsis markers: Cultures x2  Antibiotics: None   Protocols / Consultants: Cardiology PCCM hospitalist  Tests / Events: Transthoracic echo    PAST MEDICAL HISTORY   Past Medical History:  Diagnosis Date  . Absolute anemia 12/08/2014  . Allergy   . Anginal pain ([place])    much less angina d/t lack of activity  . Arthritis    all over body  . Bradycardia 05/03/2015  . Calculus of kidney 12/08/2014  . Coronary artery disease   . GERD (gastroesophageal reflux disease)   . Glaucoma   . History of kidney stones   . HLD (hyperlipidemia)   . Hyperglycemia 08/27/2015  . Hypertension   . Myocardial infarction ([place]) 2011  . Peripheral vascular disease ([place])   . VHD (valvular heart disease)      SURGICAL HISTORY   Past Surgical History:  Procedure Laterality Date  . ANTERIOR CERVICAL DECOMP/DISCECTOMY FUSION N/A 12/30/2017   Procedure: ANTERIOR CERVICAL DECOMPRESSION/DISCECTOMY FUSION 3 LEVELS- C3-6;  Surgeon: Meade Maw, MD;   Location: [facility] ORS;  Service: Neurosurgery;  Laterality: N/A;  . CARDIAC CATHETERIZATION  2007  . CARDIAC CATHETERIZATION N/A 04/17/2015   Procedure: Left Heart Cath and Coronary Angiography;  Surgeon: Corey Skains, MD;  Location: [place] [facility];  Service: Cardiovascular;  Laterality: N/A;  . CARPAL TUNNEL RELEASE Bilateral    right 06/09/08 left 05/03/09  . CATARACT EXTRACTION W/ INTRAOCULAR LENS  IMPLANT, BILATERAL    . COLONOSCOPY N/A 10/02/2014   Procedure: COLONOSCOPY;  Surgeon: Manya Silvas, MD;  Location: [facility] ENDOSCOPY;  Service: Endoscopy;  Laterality: N/A;  . CORONARY ARTERY BYPASS GRAFT  2007  . ESOPHAGOGASTRODUODENOSCOPY    . EYE SURGERY    . HEMORROIDECTOMY    . JOINT REPLACEMENT Right 03/2017   partial knee replacement  . KNEE ARTHROSCOPY Right 08/05/2016   Procedure: ARTHROSCOPY KNEE;  Surgeon: Thornton Park, MD;  Location: [facility] ORS;  Service: Orthopedics;  Laterality: Right;  . KNEE ARTHROSCOPY WITH LATERAL RELEASE Right 08/05/2016   Procedure: KNEE ARTHROSCOPY WITH LATERAL RELEASE;  Surgeon: Thornton Park, MD;  Location: [facility] ORS;  Service: Orthopedics;  Laterality: Right;  . KNEE ARTHROSCOPY WITH MEDIAL MENISECTOMY Right 08/05/2016   Procedure: KNEE ARTHROSCOPY WITH MEDIAL MENISECTOMY;  Surgeon: Thornton Park, MD;  Location: [facility] ORS;  Service: Orthopedics;  Laterality: Right;  Partial  . LUMBAR EPIDURAL INJECTION  2011  . LUMBAR LAMINECTOMY/DECOMPRESSION MICRODISCECTOMY N/A 03/10/2018   Procedure: LUMBAR LAMINECTOMY/DECOMPRESSION MICRODISCECTOMY 3 LEVELS-L2-5;  Surgeon: Meade Maw, MD;  Location: [facility] ORS;  Service: Neurosurgery;  Laterality: N/A;  . MOUTH SURGERY     tooth implant  . SHOULDER SURGERY Right 2011   rotator cuff repair  . SYNOVECTOMY Right 08/05/2016   Procedure:  SYNOVECTOMY;  Surgeon: Thornton Park, MD;  Location: [facility] ORS;  Service: Orthopedics;  Laterality: Right;  arthroscopic extensive synovectomy  . TONSILLECTOMY       FAMILY  HISTORY   Family History  Problem Relation Age of Onset  . Heart disease Mother   . Heart disease Father   . Heart disease Brother   . Heart disease Brother   . Heart disease Brother   . Heart disease Brother   . Heart disease Brother      SOCIAL HISTORY   Social History   Tobacco Use  . Smoking status: Former Smoker    Years: 15.00    Types: Cigarettes    Quit date: 1990    Years since quitting: 31.1  . Smokeless tobacco: Never Used  Substance Use Topics  . Alcohol use: Yes    Alcohol/week: 7.0 - 14.0 standard drinks    Types: 7 - 14 Glasses of wine per week    Comment: 1-2 glasses of wine a night  . Drug use: No     MEDICATIONS   Current Medication:  Current Facility-Administered Medications:  .  acetaminophen (TYLENOL) tablet 650 mg, 650 mg, Oral, Q4H PRN, Para Skeans, MD, 650 mg at 07/10/19 2122 .  aspirin EC tablet 81 mg, 81 mg, Oral, Daily, Para Skeans, MD, 81 mg at 07/10/19 0943 .  atorvastatin (LIPITOR) tablet 80 mg, 80 mg, Oral, q1800, Para Skeans, MD, 80 mg at 07/10/19 1729 .  chlorhexidine (PERIDEX) 0.12 % solution 15 mL, 15 mL, Mouth Rinse, BID, Darel Hong D, NP, 15 mL at 07/10/19 2121 .  Chlorhexidine Gluconate Cloth 2 % PADS 6 each, 6 each, Topical, Daily, Bradly Bienenstock, NP, 6 each at 07/10/19 [phone] .  dorzolamide-timolol (COSOPT) 22.3-6.8 MG/ML ophthalmic solution 1 drop, 1 drop, Both Eyes, BID, Florina Ou V, MD, 1 drop at 07/10/19 2122 .  furosemide (LASIX) injection 40 mg, 40 mg, Intravenous, Q6H, Teodoro Spray, MD, 40 mg at 07/11/19 0537 .  heparin ADULT infusion 100 units/mL (25000 units/260mL sodium chloride 0.45%), 1,500 Units/hr, Intravenous, Continuous, Hall, Scott A, RPH, Last Rate: 15 mL/hr at 07/11/19 0058, 1,500 Units/hr at 07/11/19 0058 .  latanoprost (XALATAN) 0.005 % ophthalmic solution 1 drop, 1 drop, Both Eyes, QHS, Para Skeans, MD, 1 drop at 07/10/19 2123 .  MEDLINE mouth rinse, 15 mL, Mouth Rinse, q12n4p, Darel Hong D, NP, 15 mL at 07/10/19 1729 .  Melatonin TABS 5 mg, 5 mg, Oral, QHS PRN, Florina Ou V, MD .  metoprolol tartrate (LOPRESSOR) tablet 12.5 mg, 12.5 mg, Oral, BID, Teodoro Spray, MD, 12.5 mg at 07/10/19 2121 .  morphine 2 MG/ML injection 2 mg, 2 mg, Intravenous, Q2H PRN, Teodoro Spray, MD .  multivitamin-lutein (OCUVITE-LUTEIN) capsule 1 capsule, 1 capsule, Oral, BID, Para Skeans, MD, 1 capsule at 07/10/19 2122 .  nitroGLYCERIN (NITROSTAT) SL tablet 0.4 mg, 0.4 mg, Sublingual, Q5 min PRN, Earleen Newport, MD, 0.4 mg at 07/09/19 1147 .  nitroGLYCERIN 50 mg in dextrose 5 % 250 mL (0.2 mg/mL) infusion, 0-200 mcg/min, Intravenous, Titrated, Fath, Javier Docker, MD, Stopped at 07/10/19 [phone] .  omega-3 acid ethyl esters (LOVAZA) capsule 1 g, 1 g, Oral, BID, Para Skeans, MD, 1 g at 07/10/19 2123 .  ondansetron (ZOFRAN) injection 4 mg, 4 mg, Intravenous, Q6H PRN, Florina Ou V, MD .  pantoprazole (PROTONIX) injection 40 mg, 40 mg, Intravenous, Q12H, Para Skeans, MD, 40 mg at 07/10/19 2120 .  polyvinyl alcohol (LIQUIFILM TEARS) 1.4 % ophthalmic solution, , Right Eye, Daily PRN, Para Skeans, MD .  sulfamethoxazole-trimethoprim (BACTRIM) 400-80 MG per tablet 1 tablet, 1 tablet, Oral, Q12H, Ottie Glazier, MD, 1 tablet at 07/10/19 2122 .  tamsulosin (FLOMAX) capsule 0.4 mg, 0.4 mg, Oral, Daily, Florina Ou V, MD, 0.4 mg at 07/10/19 6834    ALLERGIES   Penicillins    REVIEW OF SYSTEMS     10 point ROS conducted and is negative except for chest pain shortness of breath  PHYSICAL EXAMINATION   Vital Signs: Temp:  [97.6 F (36.4 C)-99.1 F (37.3 C)] 97.6 F (36.4 C) (02/08 0400) Pulse Rate:  [25-114] 75 (02/08 0600) Resp:  [15-32] 22 (02/08 0600) BP: (90-140)/(47-79) 125/61 (02/08 0600) SpO2:  [90 %-100 %] 100 % (02/08 0600) Weight:  [76.9 kg] 76.9 kg (02/08 0500)  GENERAL: Age-appropriate well-nourished HEAD: Normocephalic, atraumatic.  EYES: Pupils equal, round,  reactive to light.  No scleral icterus.  MOUTH: Moist mucosal membrane. NECK: Supple. No thyromegaly. No nodules. No JVD.  PULMONARY: Bilateral crackles worse at the bases with decreased breath sounds bilaterally CARDIOVASCULAR: S1 and S2. Regular rate and rhythm. No murmurs, rubs, or gallops.  GASTROINTESTINAL: Soft, nontender, non-distended. No masses. Positive bowel sounds. No hepatosplenomegaly.  MUSCULOSKELETAL: No swelling, clubbing, or edema.  NEUROLOGIC: Mild distress due to acute illness SKIN:intact,warm,dry   PERTINENT DATA     Infusions: . heparin 1,500 Units/hr (07/11/19 0058)  . nitroGLYCERIN Stopped (07/10/19 0516)   Scheduled Medications: . aspirin EC  81 mg Oral Daily  . atorvastatin  80 mg Oral q1800  . chlorhexidine  15 mL Mouth Rinse BID  . Chlorhexidine Gluconate Cloth  6 each Topical Daily  . dorzolamide-timolol  1 drop Both Eyes BID  . furosemide  40 mg Intravenous Q6H  . latanoprost  1 drop Both Eyes QHS  . mouth rinse  15 mL Mouth Rinse q12n4p  . metoprolol tartrate  12.5 mg Oral BID  . multivitamin-lutein  1 capsule Oral BID  . omega-3 acid ethyl esters  1 g Oral BID  . pantoprazole (PROTONIX) IV  40 mg Intravenous Q12H  . sulfamethoxazole-trimethoprim  1 tablet Oral Q12H  . tamsulosin  0.4 mg Oral Daily   PRN Medications: acetaminophen, Melatonin, morphine injection, nitroGLYCERIN, ondansetron (ZOFRAN) IV, polyvinyl alcohol Hemodynamic parameters:   Intake/Output: 02/07 0701 - 02/08 0700 In: 368.7 [I.V.:368.7] Out: 2100 [Urine:2100]  Ventilator  Settings:     LAB RESULTS:  Basic Metabolic Panel: Recent Labs  Lab 07/09/19 1057 07/09/19 1057 07/10/19 0512 07/10/19 0512 07/10/19 0953 07/11/19 0546  NA 135  --  136  --  135 129*  K 4.2   < > 4.0   < > 4.1 3.4*  CL 102  --  102  --  101 94*  CO2 18*  --  22  --  21* 21*  GLUCOSE 137*  --  118*  --  101* 101*  BUN 41*  --  45*  --  48* 66*  CREATININE 2.07*  --  2.39*  --  2.51*  3.01*  CALCIUM 9.1  --  8.3*  --  8.4* 8.0*   < > = values in this interval not displayed.   Liver Function Tests: Recent Labs  Lab 07/09/19 1057  AST 22  ALT 24  ALKPHOS 82  BILITOT 1.9*  PROT 7.8  ALBUMIN 4.1   No results for input(s): LIPASE, AMYLASE in the last 168 hours. No results for input(s):  AMMONIA in the last 168 hours. CBC: Recent Labs  Lab 07/09/19 1057 07/10/19 0512 07/11/19 0546  WBC 10.0 9.2 7.6  HGB 12.5* 11.3* 11.6*  HCT 37.7* 34.3* 34.2*  MCV 92.9 92.5 91.4  PLT 164 173 187   Cardiac Enzymes: No results for input(s): CKTOTAL, CKMB, CKMBINDEX, TROPONINI in the last 168 hours. BNP: Invalid input(s): POCBNP CBG: Recent Labs  Lab 07/09/19 1524  GLUCAP 141*     IMAGING RESULTS:  Imaging: DG Chest Port 1 View  Result Date: 07/10/2019 CLINICAL DATA:  Chest pain and shortness of breath EXAM: PORTABLE CHEST 1 VIEW COMPARISON:  07/09/2019 FINDINGS: Cardiomegaly and CABG changes again noted. Bilateral interstitial and airspace opacities, RIGHT greater than LEFT, are slightly improved. There may be trace bilateral pleural effusions present. No pneumothorax. IMPRESSION: Slightly improved bilateral interstitial and airspace opacities, RIGHT greater than LEFT. Electronically Signed   By: Margarette Canada M.D.   On: 07/10/2019 10:58   DG Chest Port 1 View  Result Date: 07/09/2019 CLINICAL DATA:  Chest pain EXAM: PORTABLE CHEST 1 VIEW COMPARISON:  07/09/2019 FINDINGS: Bilateral airspace disease again noted, worsening on the right since prior study with slight improvement on the left. Cardiomegaly. Suspect small right pleural effusion. No acute bony abnormality. IMPRESSION: Bilateral airspace disease, improving on the left and worsening on the right. This could reflect asymmetric edema or infection. Small right effusion. Electronically Signed   By: Rolm Baptise M.D.   On: 07/09/2019 17:15   DG Chest Port 1 View  Result Date: 07/09/2019 CLINICAL DATA:  Chest pain. EXAM:  PORTABLE CHEST 1 VIEW COMPARISON:  August 23, 2010. FINDINGS: Stable cardiomegaly. Status post coronary bypass graft. Interstitial densities are noted throughout both lungs most consistent with pulmonary edema. No pneumothorax or pleural effusion is noted. Bony thorax is unremarkable. IMPRESSION: Findings consistent with bilateral pulmonary edema. Electronically Signed   By: Marijo Conception M.D.   On: 07/09/2019 11:35   ECHOCARDIOGRAM COMPLETE  Result Date: 07/11/2019   ECHOCARDIOGRAM REPORT   Patient Name:   Austin [name] Date of Exam: 07/10/2019 Medical Rec #:  [id number]        Height:       66.0 in Accession #:    [id number]       Weight:       172.8 lb Date of Birth:  1929-01-03         BSA:          1.88 m Patient Age:    64 years         BP:           140/60 mmHg Patient Gender: M                HR:           66 bpm. Exam Location:  [facility] Procedure: 2D Echo and Intracardiac Opacification Agent Indications:     NSTEMI I21.4  History:         Patient has no prior history of Echocardiogram examinations.  Sonographer:     Arville Go RDCS Referring Phys:  [place] Diagnosing Phys: Bartholome Bill MD  Sonographer Comments: Technically difficult study due to poor echo windows. IMPRESSIONS  1. Left ventricular ejection fraction, by visual estimation, is 50 to 55%. The left ventricle has low normal function. Left ventricular septal wall thickness was mildly increased. Mildly increased left ventricular posterior wall thickness. There is mildly increased left ventricular hypertrophy.  2. Definity contrast agent was  given IV to delineate the left ventricular endocardial borders.  3. Left ventricular diastolic parameters are consistent with Grade I diastolic dysfunction (impaired relaxation).  4. The left ventricle has no regional wall motion abnormalities.  5. Global right ventricle has normal systolic function.The right ventricular size is normal. No increase in right ventricular wall thickness.  6. Left  atrial size was mildly dilated.  7. Right atrial size was normal.  8. The mitral valve is degenerative. Mild mitral valve regurgitation.  9. The tricuspid valve is not well visualized. 10. The tricuspid valve is not well visualized. Tricuspid valve regurgitation is trivial. 11. The aortic valve is abnormal. Aortic valve regurgitation is trivial. Mild to moderate aortic valve sclerosis/calcification without any evidence of aortic stenosis. 12. The pulmonic valve was not well visualized. Pulmonic valve regurgitation is trivial. 13. The atrial septum is grossly normal. FINDINGS  Left Ventricle: Left ventricular ejection fraction, by visual estimation, is 50 to 55%. The left ventricle has low normal function. Definity contrast agent was given IV to delineate the left ventricular endocardial borders. The left ventricle has no regional wall motion abnormalities. Mildly increased left ventricular posterior wall thickness. There is mildly increased left ventricular hypertrophy. Left ventricular diastolic parameters are consistent with Grade I diastolic dysfunction (impaired relaxation). Right Ventricle: The right ventricular size is normal. No increase in right ventricular wall thickness. Global RV systolic function is has normal systolic function. Left Atrium: Left atrial size was mildly dilated. Right Atrium: Right atrial size was normal in size Pericardium: There is no evidence of pericardial effusion. Mitral Valve: The mitral valve is degenerative in appearance. Mild mitral valve regurgitation. Tricuspid Valve: The tricuspid valve is not well visualized. Tricuspid valve regurgitation is trivial. Aortic Valve: The aortic valve is abnormal. Aortic valve regurgitation is trivial. Mild to moderate aortic valve sclerosis/calcification is present, without any evidence of aortic stenosis. Aortic valve mean gradient measures 9.0 mmHg. Aortic valve peak gradient measures 17.1 mmHg. Aortic valve area, by VTI measures 1.73 cm.  Pulmonic Valve: The pulmonic valve was not well visualized. Pulmonic valve regurgitation is trivial. Pulmonic regurgitation is trivial. Aorta: The aortic root is normal in size and structure. IAS/Shunts: The atrial septum is grossly normal.  LEFT VENTRICLE PLAX 2D LVIDd:         5.46 cm       Diastology LVIDs:         4.32 cm       LV e' lateral:   9.79 cm/s LV PW:         1.08 cm       LV E/e' lateral: 13.0 LV IVS:        1.43 cm LVOT diam:     2.10 cm LV SV:         61 ml LV SV Index:   31.60 LVOT Area:     3.46 cm  LV Volumes (MOD) LV area d, A2C:    38.40 cm LV area d, A4C:    28.20 cm LV area s, A2C:    27.50 cm LV area s, A4C:    17.70 cm LV major d, A2C:   7.94 cm LV major d, A4C:   6.99 cm LV major s, A2C:   7.12 cm LV major s, A4C:   6.66 cm LV vol d, MOD A2C: 150.0 ml LV vol d, MOD A4C: 92.8 ml LV vol s, MOD A2C: 85.6 ml LV vol s, MOD A4C: 38.3 ml LV SV MOD A2C:  64.4 ml LV SV MOD A4C:     92.8 ml LV SV MOD BP:      66.5 ml RIGHT VENTRICLE RV Basal diam:  2.63 cm RV S prime:     8.27 cm/s TAPSE (M-mode): 1.3 cm LEFT ATRIUM             Index       RIGHT ATRIUM           Index LA diam:        4.80 cm 2.55 cm/m  RA Area:     16.20 cm LA Vol (A2C):   73.7 ml 39.21 ml/m RA Volume:   40.10 ml  21.34 ml/m LA Vol (A4C):   40.9 ml 21.76 ml/m LA Biplane Vol: 57.6 ml 30.65 ml/m  AORTIC VALVE                    PULMONIC VALVE AV Area (Vmax):    1.56 cm     PV Vmax:       1.09 m/s AV Area (Vmean):   1.61 cm     PV Peak grad:  4.8 mmHg AV Area (VTI):     1.73 cm AV Vmax:           207.00 cm/s AV Vmean:          137.000 cm/s AV VTI:            0.374 m AV Peak Grad:      17.1 mmHg AV Mean Grad:      9.0 mmHg LVOT Vmax:         93.20 cm/s LVOT Vmean:        63.500 cm/s LVOT VTI:          0.187 m LVOT/AV VTI ratio: 0.50  AORTA Ao Root diam: 2.60 cm Ao Asc diam:  3.50 cm MITRAL VALVE MV Area (PHT): 3.60 cm             SHUNTS MV PHT:        61.19 msec           Systemic VTI:  0.19 m MV Decel Time: 211 msec              Systemic Diam: 2.10 cm MV E velocity: 127.00 cm/s 103 cm/s  Bartholome Bill MD Electronically signed by Bartholome Bill MD Signature Date/Time: 07/11/2019/7:24:55 AM    Final        ASSESSMENT AND PLAN    -Multidisciplinary rounds held today  Acute Hypoxic Respiratory Failure -due to florid pulmonary edema secondary to acute on chronic CHF as well as renal insufficiency -TTE in process - BIPAP support to unload LV and improve pulmonary mechanics -continue Bronchodilator Therapy -Morphine 2 mg every 6 hours as needed for severe chest pain   Non-ST elevation MI -History of severe CAD status post CABG -Cardiology on case-appreciate input -Status post heparin PPx dose/aspirin/nitroglycerin/morphine/metoprolol -oxygen as needed -Lasix 40 mg every 6 hours -follow up cardiac biomarkers ICU telemetry monitoring  -Chest pain oxygenation has improved  Renal Failure-acute on chronic stage II -follow chem 7 -follow UO -continue Foley Catheter-assess need daily -See nonessential nephrotoxins   Septic shock -use vasopressors to keep MAP>65 -follow ABG and LA -follow up cultures -emperic ABX -consider stress dose steroids   ID -continue IV abx as prescibed -follow up cultures  GI/Nutrition GI PROPHYLAXIS as indicated DIET-->TF's as tolerated Constipation protocol as indicated  ENDO - ICU hypoglycemic\Hyperglycemia protocol -check FSBS per protocol  ELECTROLYTES -follow labs as needed -replace as needed -pharmacy consultation   DVT/GI PRX ordered -SCDs  TRANSFUSIONS AS NEEDED MONITOR FSBS ASSESS the need for LABS as needed   Critical care provider statement:    Critical care time (minutes):  33   Critical care time was exclusive of:  Separately billable procedures and treating other patients   Critical care was necessary to treat or prevent imminent or life-threatening deterioration of the following conditions:   Acute hypoxemic respiratory failure, acute  chest pain, non-STEMI, florid pulmonary edema, status post CABG multiple comorbid conditions   Critical care was time spent personally by me on the following activities:  Development of treatment plan with patient or surrogate, discussions with consultants, evaluation of patient's response to treatment, examination of patient, obtaining history from patient or surrogate, ordering and performing treatments and interventions, ordering and review of laboratory studies and re-evaluation of patient's condition.  I assumed direction of critical care for this patient from another provider in my specialty: no    This document was prepared using Dragon voice recognition software and may include unintentional dictation errors.    Ottie Glazier, M.D.  Division of [place]

## 2019-07-11 NOTE — Plan of Care (Signed)
  Problem: Clinical Measurements: Goal: Ability to maintain clinical measurements within normal limits will improve Outcome: Progressing   Problem: Clinical Measurements: Goal: Respiratory complications will improve Outcome: Not Progressing Note: Patient requiring 2L via [place]

## 2019-07-11 NOTE — Progress Notes (Signed)
[employer] visited pt. while rounding on ICU; pt.'s wife Blanch Media) @ bedside; pt. came to [facility] Saturday experiencing what doctors identified as a heart attack; pt. reported he is now being monitored for irregular heart activity and he hopes to be transferred to a bed in a stepdown unit.  Wife shared both she and pt. had COVID earlier in December/January; pt. attends [employer] in [place] and has contacted church already to alert them to his hospitalization.  Both pt. and wife appear to be coping effectively; requested prayer @ end of visit.  Painter will follow up on stepdown unit if/when pt. is transferred.  No further needs expressed at this time.     07/11/19 1315  Clinical Encounter Type  Visited With Patient and family together  Visit Type Initial;Critical Care;Psychological support;Spiritual support;Social support  Referral From Other (Comment) (RT)  Spiritual Encounters  Spiritual Needs Prayer;Emotional  Stress Factors  Patient Stress Factors Health changes;Loss of control;Major life changes  Family Stress Factors Loss of control;Major life changes;Health changes

## 2019-07-11 NOTE — Progress Notes (Signed)
1100 Wife in to see patient. Patient up in room. Ate breakfast and lunch. No complaints of chest pain. Remains in A. Fib even though Amiodarone added .

## 2019-07-11 NOTE — Progress Notes (Signed)
[facility] for heparin Indication: chest pain/ACS  Allergies  Allergen Reactions  . Penicillins Itching, Swelling, Rash and Other (See Comments)    Has patient had a PCN reaction causing immediate rash, facial/tongue/throat swelling, SOB or lightheadedness with hypotension: Yes Has patient had a PCN reaction causing severe rash involving mucus membranes or skin necrosis: No Has patient had a PCN reaction that required hospitalization No Has patient had a PCN reaction occurring within the last 10 years: No If all of the above answers are "NO", then may proceed with Cephalosporin use.     Patient Measurements: Height: 5\' 6"  (167.6 cm) Weight: 169 lb 8.5 oz (76.9 kg) IBW/kg (Calculated) : 63.8 Heparin Dosing Weight: 77 kg  Vital Signs: Temp: 98.1 F (36.7 C) (02/08 1807) Temp Source: Oral (02/08 1807) BP: 129/60 (02/08 1807) Pulse Rate: 69 (02/08 1807)  Labs: Recent Labs     0000 07/09/19 1057 07/09/19 1113 07/09/19 1142 07/09/19 1354 07/09/19 1900 07/09/19 2233 07/10/19 0512 07/10/19 0953 07/10/19 0953 07/10/19 2144 07/11/19 0546 07/11/19 1637  HGB   < > 12.5*  --   --   --   --   --  11.3*  --   --   --  11.6*  --   HCT  --  37.7*  --   --   --   --   --  34.3*  --   --   --  34.2*  --   PLT  --  164  --   --   --   --   --  173  --   --   --  187  --   APTT  --   --  46*  --   --   --   --   --   --   --   --   --   --   LABPROT  --   --  15.7*  --   --   --   --   --   --   --   --   --   --   INR  --   --  1.3*  --   --   --   --   --   --   --   --   --   --   HEPARINUNFRC  --   --   --    < >  --   --    < >  --  0.18*   < > 0.10* 0.31 0.21*  CREATININE   < > 2.07*  --   --   --   --   --  2.39* 2.51*  --   --  3.01*  --   TROPONINIHS  --  41*  --   --  51* 240*  --   --   --   --   --   --   --    < > = values in this interval not displayed.    Estimated Creatinine Clearance: 16.6 mL/min (A) (by C-G formula based on SCr  of 3.01 mg/dL (H)).   Medical History: Past Medical History:  Diagnosis Date  . Absolute anemia 12/08/2014  . Allergy   . Anginal pain ([place])    much less angina d/t lack of activity  . Arthritis    all over body  . Bradycardia 05/03/2015  . Calculus of kidney 12/08/2014  . Coronary artery disease   .  GERD (gastroesophageal reflux disease)   . Glaucoma   . History of kidney stones   . HLD (hyperlipidemia)   . Hyperglycemia 08/27/2015  . Hypertension   . Myocardial infarction ([place]) 2011  . Peripheral vascular disease ([place])   . VHD (valvular heart disease)    Assessment: Pharmacy consulted for heparin drip management for 86 yo male admitted with history of chest pain over past several days. Patient with past medical history significant for CAD, CABG, hypertension and anemia. Patient is not on anticoagulation as an outpatient. Patient with new onset atrial fibrillation and CHF. Patient with CHA2DS2-VASc Score of at least 4 (CHF, Age > 48, HTN). Heparin is infusing at 1500 units/hr.   Heparin level in range. Will continue patient on heparin 1500 units/hr. Will obtain confirmatory level at 1600. CBC with am labs.   Goal of Therapy:  Heparin level 0.3-0.7 units/ml Monitor platelets by anticoagulation protocol: Yes   Plan:  2/8 @1637  HL= 0.21.subtherapetuic. Will order Heparin bolus of 1200 units x 1 and increase drip to 1650 units/hr. Will check HL in 8 hours.  Pharmacy will continue to monitor and adjust per consult.    Chinita Greenland PharmD Clinical Pharmacist 07/11/2019

## 2019-07-11 NOTE — Progress Notes (Signed)
Transferred to room 258 via bed. Report given to [facility]

## 2019-07-12 ENCOUNTER — Inpatient Hospital Stay: Payer: PPO

## 2019-07-12 LAB — CBC
HCT: 33.2 % — ABNORMAL LOW (ref 39.0–52.0)
Hemoglobin: 11.4 g/dL — ABNORMAL LOW (ref 13.0–17.0)
MCH: 30.9 pg (ref 26.0–34.0)
MCHC: 34.3 g/dL (ref 30.0–36.0)
MCV: 90 fL (ref 80.0–100.0)
Platelets: 199 10*3/uL (ref 150–400)
RBC: 3.69 MIL/uL — ABNORMAL LOW (ref 4.22–5.81)
RDW: 12.9 % (ref 11.5–15.5)
WBC: 7.1 10*3/uL (ref 4.0–10.5)
nRBC: 0 % (ref 0.0–0.2)

## 2019-07-12 LAB — BASIC METABOLIC PANEL
Anion gap: 13 (ref 5–15)
BUN: 75 mg/dL — ABNORMAL HIGH (ref 8–23)
CO2: 21 mmol/L — ABNORMAL LOW (ref 22–32)
Calcium: 7.7 mg/dL — ABNORMAL LOW (ref 8.9–10.3)
Chloride: 96 mmol/L — ABNORMAL LOW (ref 98–111)
Creatinine, Ser: 3.12 mg/dL — ABNORMAL HIGH (ref 0.61–1.24)
GFR calc Af Amer: 20 mL/min — ABNORMAL LOW (ref >60)
GFR calc non Af Amer: 17 mL/min — ABNORMAL LOW (ref >60)
Glucose, Bld: 114 mg/dL — ABNORMAL HIGH (ref 70–99)
Potassium: 3.6 mmol/L (ref 3.5–5.1)
Sodium: 130 mmol/L — ABNORMAL LOW (ref 135–145)

## 2019-07-12 LAB — HEPARIN LEVEL (UNFRACTIONATED): Heparin Unfractionated: 0.48 IU/mL (ref 0.30–0.70)

## 2019-07-12 LAB — MAGNESIUM: Magnesium: 2 mg/dL (ref 1.7–2.4)

## 2019-07-12 MED ORDER — AMIODARONE HCL 200 MG PO TABS
200.0000 mg | ORAL_TABLET | Freq: Every day | ORAL | Status: DC
  Administered 2019-07-12 – 2019-07-15 (×4): 200 mg via ORAL
  Filled 2019-07-12 (×4): qty 1

## 2019-07-12 MED ORDER — PANTOPRAZOLE SODIUM 40 MG PO TBEC
40.0000 mg | DELAYED_RELEASE_TABLET | Freq: Two times a day (BID) | ORAL | Status: DC
  Administered 2019-07-12: 09:00:00 40 mg via ORAL
  Filled 2019-07-12: qty 1

## 2019-07-12 MED ORDER — APIXABAN 2.5 MG PO TABS
2.5000 mg | ORAL_TABLET | Freq: Two times a day (BID) | ORAL | Status: DC
  Administered 2019-07-12 – 2019-07-15 (×7): 2.5 mg via ORAL
  Filled 2019-07-12 (×7): qty 1

## 2019-07-12 NOTE — Progress Notes (Signed)
PULMONARY PROGRESS NOTE    Name: Austin [name] MRN: [id number] DOB: 1928-06-04     LOS: 3   SUBJECTIVE FINDINGS & SIGNIFICANT EVENTS   Patient description:  86 year old male with history of severe CAD status post CABG, essential hypertension, chronic anemia and came into the hospital for severe chest pain.  He also states 3 days of worsening dyspnea.  Was admitted by hospitalist service to stepdown for possible non-STEMI and cardiology consultation.  When patient came into stepdown unit he started complaining of worsening chest pain and noted to have severe desaturation with cyanosis on clinical exam.  Critical care consultation was placed due to patient clinically declining with hypoxemia and worsening chest pain    Lines / Drains: Peripheral IV x2  Cultures / Sepsis markers: Cultures x2  Antibiotics: None   Protocols / Consultants: Cardiology PCCM hospitalist  Tests / Events: Transthoracic echo  07/12/19 - worsening GFR will ask for nephro eval today to optimize renal function prior to left heart cath. Respiratory status improved, will continue with chest physiotherapy to recruit residual atelectatic segments and regular PT/OT today.    PAST MEDICAL HISTORY   Past Medical History:  Diagnosis Date  . Absolute anemia 12/08/2014  . Allergy   . Anginal pain ([place])    much less angina d/t lack of activity  . Arthritis    all over body  . Bradycardia 05/03/2015  . Calculus of kidney 12/08/2014  . Coronary artery disease   . GERD (gastroesophageal reflux disease)   . Glaucoma   . History of kidney stones   . HLD (hyperlipidemia)   . Hyperglycemia 08/27/2015  . Hypertension   . Myocardial infarction ([place]) 2011  . Peripheral vascular disease ([place])   . VHD (valvular heart disease)      SURGICAL  HISTORY   Past Surgical History:  Procedure Laterality Date  . ANTERIOR CERVICAL DECOMP/DISCECTOMY FUSION N/A 12/30/2017   Procedure: ANTERIOR CERVICAL DECOMPRESSION/DISCECTOMY FUSION 3 LEVELS- C3-6;  Surgeon: Meade Maw, MD;  Location: [facility] ORS;  Service: Neurosurgery;  Laterality: N/A;  . CARDIAC CATHETERIZATION  2007  . CARDIAC CATHETERIZATION N/A 04/17/2015   Procedure: Left Heart Cath and Coronary Angiography;  Surgeon: Corey Skains, MD;  Location: [facility];  Service: Cardiovascular;  Laterality: N/A;  . CARPAL TUNNEL RELEASE Bilateral    right 06/09/08 left 05/03/09  . CATARACT EXTRACTION W/ INTRAOCULAR LENS  IMPLANT, BILATERAL    . COLONOSCOPY N/A 10/02/2014   Procedure: COLONOSCOPY;  Surgeon: Manya Silvas, MD;  Location: [facility] ENDOSCOPY;  Service: Endoscopy;  Laterality: N/A;  . CORONARY ARTERY BYPASS GRAFT  2007  . ESOPHAGOGASTRODUODENOSCOPY    . EYE SURGERY    . HEMORROIDECTOMY    . JOINT REPLACEMENT Right 03/2017   partial knee replacement  . KNEE ARTHROSCOPY Right 08/05/2016   Procedure: ARTHROSCOPY KNEE;  Surgeon: Thornton Park, MD;  Location: [facility] ORS;  Service: Orthopedics;  Laterality: Right;  . KNEE ARTHROSCOPY WITH LATERAL RELEASE Right 08/05/2016   Procedure: KNEE ARTHROSCOPY WITH LATERAL RELEASE;  Surgeon: Thornton Park, MD;  Location: [facility] ORS;  Service: Orthopedics;  Laterality: Right;  . KNEE ARTHROSCOPY WITH MEDIAL MENISECTOMY Right 08/05/2016   Procedure: KNEE ARTHROSCOPY WITH MEDIAL MENISECTOMY;  Surgeon: Thornton Park, MD;  Location: [facility] ORS;  Service: Orthopedics;  Laterality: Right;  Partial  . LUMBAR EPIDURAL INJECTION  2011  . LUMBAR LAMINECTOMY/DECOMPRESSION MICRODISCECTOMY N/A 03/10/2018   Procedure: LUMBAR LAMINECTOMY/DECOMPRESSION MICRODISCECTOMY 3 LEVELS-L2-5;  Surgeon: Meade Maw, MD;  Location[facility]  ORS;  Service: Neurosurgery;  Laterality: N/A;  . MOUTH SURGERY     tooth implant  . SHOULDER SURGERY Right 2011    rotator cuff repair  . SYNOVECTOMY Right 08/05/2016   Procedure: SYNOVECTOMY;  Surgeon: Thornton Park, MD;  Location: [facility] ORS;  Service: Orthopedics;  Laterality: Right;  arthroscopic extensive synovectomy  . TONSILLECTOMY       FAMILY HISTORY   Family History  Problem Relation Age of Onset  . Heart disease Mother   . Heart disease Father   . Heart disease Brother   . Heart disease Brother   . Heart disease Brother   . Heart disease Brother   . Heart disease Brother      SOCIAL HISTORY   Social History   Tobacco Use  . Smoking status: Former Smoker    Years: 15.00    Types: Cigarettes    Quit date: 1990    Years since quitting: 31.1  . Smokeless tobacco: Never Used  Substance Use Topics  . Alcohol use: Yes    Alcohol/week: 7.0 - 14.0 standard drinks    Types: 7 - 14 Glasses of wine per week    Comment: 1-2 glasses of wine a night  . Drug use: No     MEDICATIONS   Current Medication:  Current Facility-Administered Medications:  .  acetaminophen (TYLENOL) tablet 650 mg, 650 mg, Oral, Q4H PRN, Para Skeans, MD, 650 mg at 07/10/19 2122 .  amiodarone (PACERONE) tablet 200 mg, 200 mg, Oral, Daily, Teodoro Spray, MD, 200 mg at 07/12/19 0844 .  apixaban (ELIQUIS) tablet 2.5 mg, 2.5 mg, Oral, BID, Teodoro Spray, MD, 2.5 mg at 07/12/19 0844 .  aspirin EC tablet 81 mg, 81 mg, Oral, Daily, Para Skeans, MD, 81 mg at 07/12/19 0844 .  atorvastatin (LIPITOR) tablet 80 mg, 80 mg, Oral, q1800, Para Skeans, MD, 80 mg at 07/11/19 1725 .  chlorhexidine (PERIDEX) 0.12 % solution 15 mL, 15 mL, Mouth Rinse, BID, Darel Hong D, NP, 15 mL at 07/12/19 0845 .  Chlorhexidine Gluconate Cloth 2 % PADS 6 each, 6 each, Topical, Daily, Bradly Bienenstock, NP, 6 each at 07/10/19 [phone] .  dorzolamide-timolol (COSOPT) 22.3-6.8 MG/ML ophthalmic solution 1 drop, 1 drop, Both Eyes, BID, Para Skeans, MD, 1 drop at 07/12/19 0845 .  latanoprost (XALATAN) 0.005 % ophthalmic solution 1 drop, 1  drop, Both Eyes, QHS, Para Skeans, MD, 1 drop at 07/11/19 2204 .  MEDLINE mouth rinse, 15 mL, Mouth Rinse, q12n4p, Darel Hong D, NP, 15 mL at 07/11/19 1616 .  Melatonin TABS 5 mg, 5 mg, Oral, QHS PRN, Para Skeans, MD .  multivitamin-lutein (OCUVITE-LUTEIN) capsule 1 capsule, 1 capsule, Oral, BID, Para Skeans, MD, 1 capsule at 07/12/19 0844 .  nitroGLYCERIN (NITROSTAT) SL tablet 0.4 mg, 0.4 mg, Sublingual, Q5 min PRN, Earleen Newport, MD, 0.4 mg at 07/09/19 1147 .  omega-3 acid ethyl esters (LOVAZA) capsule 1 g, 1 g, Oral, BID, Para Skeans, MD, 1 g at 07/12/19 0844 .  ondansetron (ZOFRAN) injection 4 mg, 4 mg, Intravenous, Q6H PRN, Florina Ou V, MD .  pantoprazole (PROTONIX) EC tablet 40 mg, 40 mg, Oral, BID, Oswald Hillock, RPH, 40 mg at 07/12/19 0844 .  polyvinyl alcohol (LIQUIFILM TEARS) 1.4 % ophthalmic solution, , Right Eye, Daily PRN, Para Skeans, MD .  sulfamethoxazole-trimethoprim (BACTRIM) 400-80 MG per tablet 1 tablet, 1 tablet, Oral, Q12H, Ottie Glazier, MD, 1 tablet at  07/12/19 0843 .  tamsulosin (FLOMAX) capsule 0.4 mg, 0.4 mg, Oral, Daily, Florina Ou V, MD, 0.4 mg at 07/12/19 0844    ALLERGIES   Penicillins    REVIEW OF SYSTEMS     10 point ROS conducted and is negative except for chest pain shortness of breath  PHYSICAL EXAMINATION   Vital Signs: Temp:  [97.7 F (36.5 C)-98.7 F (37.1 C)] 98.2 F (36.8 C) (02/09 0806) Pulse Rate:  [56-84] 60 (02/09 0806) Resp:  [17-29] 18 (02/09 0806) BP: (99-143)/(47-84) 115/59 (02/09 0806) SpO2:  [90 %-99 %] 94 % (02/09 0806) Weight:  [74.1 kg-76.7 kg] 74.1 kg (02/09 0358)  GENERAL: Age-appropriate well-nourished HEAD: Normocephalic, atraumatic.  EYES: Pupils equal, round, reactive to light.  No scleral icterus.  MOUTH: Moist mucosal membrane. NECK: Supple. No thyromegaly. No nodules. No JVD.  PULMONARY: CTAB wit diminieshed bs bilaterally  CARDIOVASCULAR: S1 and S2. Regular rate and rhythm. No  murmurs, rubs, or gallops.  GASTROINTESTINAL: Soft, nontender, non-distended. No masses. Positive bowel sounds. No hepatosplenomegaly.  MUSCULOSKELETAL: No swelling, clubbing, or edema.  NEUROLOGIC: Mild distress due to acute illness SKIN:intact,warm,dry   PERTINENT DATA     Infusions:  Scheduled Medications: . amiodarone  200 mg Oral Daily  . apixaban  2.5 mg Oral BID  . aspirin EC  81 mg Oral Daily  . atorvastatin  80 mg Oral q1800  . chlorhexidine  15 mL Mouth Rinse BID  . Chlorhexidine Gluconate Cloth  6 each Topical Daily  . dorzolamide-timolol  1 drop Both Eyes BID  . latanoprost  1 drop Both Eyes QHS  . mouth rinse  15 mL Mouth Rinse q12n4p  . multivitamin-lutein  1 capsule Oral BID  . omega-3 acid ethyl esters  1 g Oral BID  . pantoprazole  40 mg Oral BID  . sulfamethoxazole-trimethoprim  1 tablet Oral Q12H  . tamsulosin  0.4 mg Oral Daily   PRN Medications: acetaminophen, Melatonin, nitroGLYCERIN, ondansetron (ZOFRAN) IV, polyvinyl alcohol Hemodynamic parameters:   Intake/Output: 02/08 0701 - 02/09 0700 In: 1274.2 [P.O.:600; I.V.:674.2] Out: 1950 [PNTIR:4431]  Ventilator  Settings:     LAB RESULTS:  Basic Metabolic Panel: Recent Labs  Lab 07/09/19 1057 07/09/19 1057 07/10/19 0512 07/10/19 0512 07/10/19 0953 07/10/19 0953 07/11/19 0546 07/12/19 0249  NA 135  --  136  --  135  --  129* 130*  K 4.2   < > 4.0   < > 4.1   < > 3.4* 3.6  CL 102  --  102  --  101  --  94* 96*  CO2 18*  --  22  --  21*  --  21* 21*  GLUCOSE 137*  --  118*  --  101*  --  101* 114*  BUN 41*  --  45*  --  48*  --  66* 75*  CREATININE 2.07*  --  2.39*  --  2.51*  --  3.01* 3.12*  CALCIUM 9.1  --  8.3*  --  8.4*  --  8.0* 7.7*  MG  --   --   --   --   --   --  2.2 2.0  PHOS  --   --   --   --   --   --  5.1*  --    < > = values in this interval not displayed.   Liver Function Tests: Recent Labs  Lab 07/09/19 1057  AST 22  ALT 24  ALKPHOS 82  BILITOT  1.9*  PROT 7.8    ALBUMIN 4.1   No results for input(s): LIPASE, AMYLASE in the last 168 hours. No results for input(s): AMMONIA in the last 168 hours. CBC: Recent Labs  Lab 07/09/19 1057 07/10/19 0512 07/11/19 0546 07/12/19 0249  WBC 10.0 9.2 7.6 7.1  HGB 12.5* 11.3* 11.6* 11.4*  HCT 37.7* 34.3* 34.2* 33.2*  MCV 92.9 92.5 91.4 90.0  PLT 164 173 187 199   Cardiac Enzymes: No results for input(s): CKTOTAL, CKMB, CKMBINDEX, TROPONINI in the last 168 hours. BNP: Invalid input(s): POCBNP CBG: Recent Labs  Lab 07/09/19 1524  GLUCAP 141*     IMAGING RESULTS:  Imaging: DG Chest Port 1 View  Result Date: 07/10/2019 CLINICAL DATA:  Chest pain and shortness of breath EXAM: PORTABLE CHEST 1 VIEW COMPARISON:  07/09/2019 FINDINGS: Cardiomegaly and CABG changes again noted. Bilateral interstitial and airspace opacities, RIGHT greater than LEFT, are slightly improved. There may be trace bilateral pleural effusions present. No pneumothorax. IMPRESSION: Slightly improved bilateral interstitial and airspace opacities, RIGHT greater than LEFT. Electronically Signed   By: Margarette Canada M.D.   On: 07/10/2019 10:58       ASSESSMENT AND PLAN    -Multidisciplinary rounds held today  Acute Hypoxic Respiratory Failure- improved now on 2Lnc -due to florid pulmonary edema secondary to acute on chronic CHF as well as renal insufficiency -TTE -reviewed - continue [place] for supplemental O2 - IS at bedside -metaneb BPH BID -continue Bronchodilator Therapy   Non-ST elevation MI -History of severe CAD status post CABG -Cardiology on case-appreciate input -Status post heparin PPx dose/aspirin/nitroglycerin/morphine/metoprolol -oxygen as needed -Lasix now stopped -follow up cardiac biomarkers ICU telemetry monitoring  -Chest pain oxygenation has improved  Renal Failure-acute on chronic stage II -follow chem 7 -follow UO -continue Foley Catheter-assess need daily -d/c nonessential nephrotoxins-stopped  antibiotics and protonix for now -nephrology consult- order placed    DVT/GI PRX ordered -SCDs  TRANSFUSIONS AS NEEDED MONITOR FSBS ASSESS the need for LABS as needed   This document was prepared using Dragon voice recognition software and may include unintentional dictation errors.    Ottie Glazier, M.D.  Division of [place]

## 2019-07-12 NOTE — Progress Notes (Addendum)
Patient Name: Austin [name] Date of Encounter: 07/12/2019  Hospital Problem List     Principal Problem:   Coronary artery disease involving native coronary artery of native heart with unstable angina pectoris [facility]) Active Problems:   Benign essential HTN   Acid reflux   Glaucoma   Borderline diabetes   Atrial fibrillation [facility])    Patient Profile     86 y.o.malewith history ofCAD s/p CABG x 3 in 2007 with LIMA to LAD, SVG to OM1, and PDA (cath from 2016 with 3/3 functional grafts), mild aortic stenosis, HTN, HLD, and carotid artery stenosis (40-59% to bilateral ICAs), followed by Dr. Gabriel Carina presents emergency room today with chest pain that started approximately 3 AM on day of admission. Marland Kitchen He reported nausea, diaphoresis and shortness of breath. Pain was 7 on 10 on presentation which improved with sublingual nitroglycerin. Stated his pain was similar to his angina. EKG in the emergency room revealed ST depression in the lateral leads. Initial troponin was 41 with a subsequent value of 51. He was started on heparin. BNP was 304. Creatinine was 2.07 with a baseline of approximately 1.5.  Echo showed near normal LV function EF 50 to 55% with no obvious regional wall motion abnormality.  Sclerotic to minimally stenotic aortic valve with no significant AI MR or TR.  Respiratory status  improving.  Mild troponin bump.  BNP improved to. to 337.  Renal function continues to decline.  Off diuresis.   Subjective   Feels better. Has some gastric discomfort.   Inpatient Medications    . amiodarone  200 mg Oral Daily  . aspirin EC  81 mg Oral Daily  . atorvastatin  80 mg Oral q1800  . chlorhexidine  15 mL Mouth Rinse BID  . Chlorhexidine Gluconate Cloth  6 each Topical Daily  . dorzolamide-timolol  1 drop Both Eyes BID  . latanoprost  1 drop Both Eyes QHS  . mouth rinse  15 mL Mouth Rinse q12n4p  . multivitamin-lutein  1 capsule Oral BID  . omega-3 acid ethyl esters  1 g  Oral BID  . pantoprazole (PROTONIX) IV  40 mg Intravenous Q12H  . sulfamethoxazole-trimethoprim  1 tablet Oral Q12H  . tamsulosin  0.4 mg Oral Daily    Vital Signs    Vitals:   07/11/19 1940 07/11/19 2200 07/12/19 0043 07/12/19 0358  BP: (!) 143/56  113/65 (!) 108/47  Pulse: 73 71 62 65  Resp:      Temp: 98.7 F (37.1 C)   97.7 F (36.5 C)  TempSrc: Oral   Oral  SpO2: 92%  94% 95%  Weight:    74.1 kg  Height:        Intake/Output Summary (Last 24 hours) at 07/12/2019 0742 Last data filed at 07/12/2019 0433 Gross per 24 hour  Intake 1274.24 ml  Output 1950 ml  Net -675.76 ml   Filed Weights   07/11/19 0500 07/11/19 1831 07/12/19 0358  Weight: 76.9 kg 76.7 kg 74.1 kg    Physical Exam    GEN: Well nourished, well developed, in no acute distress.  HEENT: normal.  Neck: Supple, no JVD, carotid bruits, or masses. Cardiac: irregularly irr Respiratory:  Respirations regular and unlabored, clear to auscultation bilaterally. GI: Soft, nontender, nondistended, BS + x 4. MS: no deformity or atrophy. Skin: warm and dry, no rash. Neuro:  Strength and sensation are intact. Psych: Normal affect.  Labs    CBC Recent Labs    07/11/19 0546  07/12/19 0249  WBC 7.6 7.1  HGB 11.6* 11.4*  HCT 34.2* 33.2*  MCV 91.4 90.0  PLT 187 720   Basic Metabolic Panel Recent Labs    07/11/19 0546 07/12/19 0249  NA 129* 130*  K 3.4* 3.6  CL 94* 96*  CO2 21* 21*  GLUCOSE 101* 114*  BUN 66* 75*  CREATININE 3.01* 3.12*  CALCIUM 8.0* 7.7*  MG 2.2 2.0  PHOS 5.1*  --    Liver Function Tests Recent Labs    07/09/19 1057  AST 22  ALT 24  ALKPHOS 82  BILITOT 1.9*  PROT 7.8  ALBUMIN 4.1   No results for input(s): LIPASE, AMYLASE in the last 72 hours. Cardiac Enzymes No results for input(s): CKTOTAL, CKMB, CKMBINDEX, TROPONINI in the last 72 hours. BNP Recent Labs    07/09/19 1116 07/10/19 0512 07/11/19 0546  BNP 304.0* 537.0* 337.0*   D-Dimer No results for input(s):  DDIMER in the last 72 hours. Hemoglobin A1C No results for input(s): HGBA1C in the last 72 hours. Fasting Lipid Panel No results for input(s): CHOL, HDL, LDLCALC, TRIG, CHOLHDL, LDLDIRECT in the last 72 hours. Thyroid Function Tests No results for input(s): TSH, T4TOTAL, T3FREE, THYROIDAB in the last 72 hours.  Invalid input(s): FREET3  Telemetry    afib with slow vr.   ECG    afib with rvr  Radiology    DG Chest Port 1 View  Result Date: 07/10/2019 CLINICAL DATA:  Chest pain and shortness of breath EXAM: PORTABLE CHEST 1 VIEW COMPARISON:  07/09/2019 FINDINGS: Cardiomegaly and CABG changes again noted. Bilateral interstitial and airspace opacities, RIGHT greater than LEFT, are slightly improved. There may be trace bilateral pleural effusions present. No pneumothorax. IMPRESSION: Slightly improved bilateral interstitial and airspace opacities, RIGHT greater than LEFT. Electronically Signed   By: Margarette Canada M.D.   On: 07/10/2019 10:58   DG Chest Port 1 View  Result Date: 07/09/2019 CLINICAL DATA:  Chest pain EXAM: PORTABLE CHEST 1 VIEW COMPARISON:  07/09/2019 FINDINGS: Bilateral airspace disease again noted, worsening on the right since prior study with slight improvement on the left. Cardiomegaly. Suspect small right pleural effusion. No acute bony abnormality. IMPRESSION: Bilateral airspace disease, improving on the left and worsening on the right. This could reflect asymmetric edema or infection. Small right effusion. Electronically Signed   By: Rolm Baptise M.D.   On: 07/09/2019 17:15   DG Chest Port 1 View  Result Date: 07/09/2019 CLINICAL DATA:  Chest pain. EXAM: PORTABLE CHEST 1 VIEW COMPARISON:  August 23, 2010. FINDINGS: Stable cardiomegaly. Status post coronary bypass graft. Interstitial densities are noted throughout both lungs most consistent with pulmonary edema. No pneumothorax or pleural effusion is noted. Bony thorax is unremarkable. IMPRESSION: Findings consistent with  bilateral pulmonary edema. Electronically Signed   By: Marijo Conception M.D.   On: 07/09/2019 11:35   ECHOCARDIOGRAM COMPLETE  Result Date: 07/11/2019   ECHOCARDIOGRAM REPORT   Patient Name:   Austin [name] Date of Exam: 07/10/2019 Medical Rec #:  [id number]        Height:       66.0 in Accession #:    [id number]       Weight:       172.8 lb Date of Birth:  08/08/1928         BSA:          1.88 m Patient Age:    30 years         BP:  140/60 mmHg Patient Gender: M                HR:           66 bpm. Exam Location:  [facility] Procedure: 2D Echo and Intracardiac Opacification Agent Indications:     NSTEMI I21.4  History:         Patient has no prior history of Echocardiogram examinations.  Sonographer:     Arville Go RDCS Referring Phys:  [place] Diagnosing Phys: Bartholome Bill MD  Sonographer Comments: Technically difficult study due to poor echo windows. IMPRESSIONS  1. Left ventricular ejection fraction, by visual estimation, is 50 to 55%. The left ventricle has low normal function. Left ventricular septal wall thickness was mildly increased. Mildly increased left ventricular posterior wall thickness. There is mildly increased left ventricular hypertrophy.  2. Definity contrast agent was given IV to delineate the left ventricular endocardial borders.  3. Left ventricular diastolic parameters are consistent with Grade I diastolic dysfunction (impaired relaxation).  4. The left ventricle has no regional wall motion abnormalities.  5. Global right ventricle has normal systolic function.The right ventricular size is normal. No increase in right ventricular wall thickness.  6. Left atrial size was mildly dilated.  7. Right atrial size was normal.  8. The mitral valve is degenerative. Mild mitral valve regurgitation.  9. The tricuspid valve is not well visualized. 10. The tricuspid valve is not well visualized. Tricuspid valve regurgitation is trivial. 11. The aortic valve is abnormal. Aortic valve  regurgitation is trivial. Mild to moderate aortic valve sclerosis/calcification without any evidence of aortic stenosis. 12. The pulmonic valve was not well visualized. Pulmonic valve regurgitation is trivial. 13. The atrial septum is grossly normal. FINDINGS  Left Ventricle: Left ventricular ejection fraction, by visual estimation, is 50 to 55%. The left ventricle has low normal function. Definity contrast agent was given IV to delineate the left ventricular endocardial borders. The left ventricle has no regional wall motion abnormalities. Mildly increased left ventricular posterior wall thickness. There is mildly increased left ventricular hypertrophy. Left ventricular diastolic parameters are consistent with Grade I diastolic dysfunction (impaired relaxation). Right Ventricle: The right ventricular size is normal. No increase in right ventricular wall thickness. Global RV systolic function is has normal systolic function. Left Atrium: Left atrial size was mildly dilated. Right Atrium: Right atrial size was normal in size Pericardium: There is no evidence of pericardial effusion. Mitral Valve: The mitral valve is degenerative in appearance. Mild mitral valve regurgitation. Tricuspid Valve: The tricuspid valve is not well visualized. Tricuspid valve regurgitation is trivial. Aortic Valve: The aortic valve is abnormal. Aortic valve regurgitation is trivial. Mild to moderate aortic valve sclerosis/calcification is present, without any evidence of aortic stenosis. Aortic valve mean gradient measures 9.0 mmHg. Aortic valve peak gradient measures 17.1 mmHg. Aortic valve area, by VTI measures 1.73 cm. Pulmonic Valve: The pulmonic valve was not well visualized. Pulmonic valve regurgitation is trivial. Pulmonic regurgitation is trivial. Aorta: The aortic root is normal in size and structure. IAS/Shunts: The atrial septum is grossly normal.  LEFT VENTRICLE PLAX 2D LVIDd:         5.46 cm       Diastology LVIDs:         4.32  cm       LV e' lateral:   9.79 cm/s LV PW:         1.08 cm       LV E/e' lateral: 13.0 LV IVS:  1.43 cm LVOT diam:     2.10 cm LV SV:         61 ml LV SV Index:   31.60 LVOT Area:     3.46 cm  LV Volumes (MOD) LV area d, A2C:    38.40 cm LV area d, A4C:    28.20 cm LV area s, A2C:    27.50 cm LV area s, A4C:    17.70 cm LV major d, A2C:   7.94 cm LV major d, A4C:   6.99 cm LV major s, A2C:   7.12 cm LV major s, A4C:   6.66 cm LV vol d, MOD A2C: 150.0 ml LV vol d, MOD A4C: 92.8 ml LV vol s, MOD A2C: 85.6 ml LV vol s, MOD A4C: 38.3 ml LV SV MOD A2C:     64.4 ml LV SV MOD A4C:     92.8 ml LV SV MOD BP:      66.5 ml RIGHT VENTRICLE RV Basal diam:  2.63 cm RV S prime:     8.27 cm/s TAPSE (M-mode): 1.3 cm LEFT ATRIUM             Index       RIGHT ATRIUM           Index LA diam:        4.80 cm 2.55 cm/m  RA Area:     16.20 cm LA Vol (A2C):   73.7 ml 39.21 ml/m RA Volume:   40.10 ml  21.34 ml/m LA Vol (A4C):   40.9 ml 21.76 ml/m LA Biplane Vol: 57.6 ml 30.65 ml/m  AORTIC VALVE                    PULMONIC VALVE AV Area (Vmax):    1.56 cm     PV Vmax:       1.09 m/s AV Area (Vmean):   1.61 cm     PV Peak grad:  4.8 mmHg AV Area (VTI):     1.73 cm AV Vmax:           207.00 cm/s AV Vmean:          137.000 cm/s AV VTI:            0.374 m AV Peak Grad:      17.1 mmHg AV Mean Grad:      9.0 mmHg LVOT Vmax:         93.20 cm/s LVOT Vmean:        63.500 cm/s LVOT VTI:          0.187 m LVOT/AV VTI ratio: 0.50  AORTA Ao Root diam: 2.60 cm Ao Asc diam:  3.50 cm MITRAL VALVE MV Area (PHT): 3.60 cm             SHUNTS MV PHT:        61.19 msec           Systemic VTI:  0.19 m MV Decel Time: 211 msec             Systemic Diam: 2.10 cm MV E velocity: 127.00 cm/s 103 cm/s  Bartholome Bill MD Electronically signed by Bartholome Bill MD Signature Date/Time: 07/11/2019/7:24:55 AM    Final     Assessment & Plan    86 y.o.malewith history ofCAD s/p CABG x 3 in 2007 with LIMA to LAD, SVG to OM1, and PDA (cath from 2016 with  3/3 functional grafts), mild aortic stenosis, HTN, HLD, and  carotid artery stenosis (40-59% to bilateral ICAs), followed by Dr. Juanetta Gosling presented to the ER with chest pain starting at 3 AM this morning. EKG showed ST depression in the lateral leads which was somewhat more normal than his baseline.  Currently shows atrial fibrillation with controlled ventricular responseinitial troponin was 41 followed by 51 with peaked at 240..  1. Coronary artery disease  Status post coronary artery bypass grafting x3 in 2007 with a LIMA to the LAD, vein graft to OM1 and PDA. Cardiac cath in 2016 revealed 3 functional grasp with mild aortic stenosis. Presented with chest tightness shortness of breath and hypoxia. Had a mild troponin elevation peaking at 240. Has no chest pain at present. No ischemic changes on electrocardiogram. Likely will need consideration for invasive evaluation prior to discharge however will need to optimize pulmonary function, renal function before consideration of this. Would continue with heparin IV, control rate with metoprolol tartrate 12.5 mg twice daily for now. . We will continue with IV nitroglycerin to titrate pressure and for afterload reduction. Continue with atorvastatin and aspirin. Likely not an acute coronary event but demand ischemia with limited coronary reserve and at least mild aortic stenosis. We will continue to follow defer any consideration for invasive evaluation until renal function stabilizes.  We will continue with heparin.  We will continue with aspirin, atorvastatin.  May need to hold beta-blocker while starting amiodarone if rate declines.  Hemodynamically stable.  2. Respiratory failure  Oxygenation improving.We will continue to diurese following renal function, chest x-ray, hemodynamics and electrolytes. BNP improved somewhat to 337 down from 537. Appreciate Dr. Teodoro Kil care  3. Acute on chronic renal  insufficiency  Renal function decreased further.Diuresing fairly well and is -3.2 L since admission. Will need to follow renal function.  Creatinine 3.01 this morning with a GFR of 18 down from 28 on admission.  Likely acute on chronic kidney injury due to hypoxia and hemodynamics with events of yesterday. We will continue to follow and adjust renally cleared drugs as needed.   4. Atrial fibrillation  Apparently new. May have begun prior to admission and loss of atrial kick may have exacerbated his clinical decline.  Will try to load with amiodarone to see if we can convert him back to normal rhythm which hopefully will improve his filling.  Prior to discharge will need chronic anticoagulation .   Will carefully loaded with amiodarone.  5. Congestive heart failure  Echo revealed fairly preserved LV function EF around 50% globally with mild LVH. A sclerotic to mildly stenotic aortic valve. No obvious wall motion abnormality. We will continue to carefully diurese. EF about 50% so we will continue with metoprolol tartrate. Would not afterload reduced at present given preserved LV function and acute renal insufficiency on last directed by nephrology.    86 y.o.malewith history ofCAD s/p CABG x 3 in 2007 with LIMA to LAD, SVG to OM1, and PDA (cath from 2016 with 3/3 functional grafts), mild aortic stenosis, HTN, HLD, and carotid artery stenosis (40-59% to bilateral ICAs), followed by Dr. Juanetta Gosling presented to the ER with chest pain starting at 3 AM this morning. EKG showed ST depression in the lateral leads which was somewhat more normal than his baseline.  Currently shows atrial fibrillation withslow ventricular responseinitial troponin was 41 followed by 51 with peaked at 240..  1. Coronary artery disease  Status post coronary artery bypass grafting x3 in 2007 with a LIMA to the LAD, vein graft to OM1 and PDA. Cardiac cath  in 2016 revealed 3 functional grasp with mild aortic stenosis. Presented with chest tightness shortness of breath and hypoxia. Had a mild troponin elevation peaking at 240. Has no chest pain at present. No ischemic changes on electrocardiogram.  Would continue with heparin IV, control rate with metoprolol tartrate 12.5 mg twice daily for now. . We will continue with IV nitroglycerin to titrate pressure and for afterload reduction. Continue with atorvastatin and aspirin. Likely not an acute coronary event but demand ischemia with afib with rvr and with limited coronary reserve and at least mild aortic stenosis.  Will need to defer invasive evaluation for now due to renal function. Will d/c heparin and treat with asa and will need to start eliquis at 2.5 mg bid due to renal function and age.   We will continue with aspirin, atorvastatin.   Hemodynamically stable.  2. Respiratory failure  Oxygenation improved.BNP improved somewhat to 337 down from 537. Appreciate Dr. Teodoro Kil care in the icu.   3. Acute on chronic renal insufficiency  Renal function decreased further.Diuresing fairly well and is -3.2 L since admission. Will need to follow renal function.  Creatinine 3.12 this morning with a GFR of 17 down from 28 on admission.  Likely acute on chronic kidney injury due to hypoxia and hemodynamics with events of yesterday. We will continue to follow and adjust renally cleared drugs as needed.   4. Atrial fibrillation  Apparently new. May have begun prior to admission and loss of atrial kick and rapid ventricular response likely exacerbated his clinical decline.  Had started iv amiodarone for load however, remains in afib with slow vr. Was taken off of the amiodarone drip. Will stop metoprolol for now and attempt  To resume amiodarone po at only 200 mg daily following heart rate and hemodynamics.   5. Congestive heart  failure  Echo revealed fairly preserved LV function EF around 50% globally with mild LVH. A sclerotic to mildly stenotic aortic valve. No obvious wall motion abnormality. Off diuretics. Will follow renal function and  volume status.  Has diuresed nearly 4 liters and appoximately 3 kg since admission. EF about 50% so we will continue with metoprolol tartrate. Would not afterload reduced at present given preserved LV function and acute renal insufficiency for now.   Signed, Javier Docker Fath MD 07/12/2019, 7:42 AM  Pager: [phone]

## 2019-07-12 NOTE — Progress Notes (Signed)
[address] [place], [place] [address] Phone [phone]. Fax [phone]  Date: 07/12/2019                  Patient Name:  Austin [name]  MRN: [id number]  DOB: [birth year]  Age / Sex: 86 y.o., male         PCP: Jerrol Banana., MD                 Service Requesting Consult: Bartholome Bill, MD / Enzo Bi, MD                 Reason for Consult: Acute Kidney Injury            History of Present Illness: Patient is a 86 y.o. male with medical problems of CAD s/p CABG x3 with unstable angina, hypertension, hyperlipidemia, atrial fibrillation, carotid artery stenosis, and mild aortic stenosis who was admitted to [facility] on 07/09/2019 for evaluation of chest pain. He later had worsening of the chest pain associated with hypoxia resulting in admission to ICU with diagnosis of NSTEMI. He was given 4 doses of Lasix on 07/10/19 and one dose on 07/11/19 for pleural effusions but it was discontinued due to increasing serum creatinine.  He denies any chest pain, SOB, cough, or lower extremity edema today.  He feels that his urine output has been normal. Notes mild dryness of his mouth.   Medications: Outpatient medications: Medications Prior to Admission  Medication Sig Dispense Refill Last Dose  . amLODipine (NORVASC) 5 MG tablet Take 5 mg by mouth daily.   07/08/2019 at 2200  . atorvastatin (LIPITOR) 20 MG tablet Take 20 mg by mouth daily.    07/08/2019 at 2000  . Biotin 5000 MCG CAPS Take 5,000 mcg by mouth daily.    07/08/2019 at 0900  . Cholecalciferol (VITAMIN D3) 2000 UNITS capsule Take 2,000 Units by mouth 2 (two) times daily.    07/08/2019 at 0900  . Coenzyme Q10 (COQ-10) 200 MG CAPS Take 200 mg by mouth daily.   07/08/2019 at 0900  . dorzolamide-timolol (COSOPT) 22.3-6.8 MG/ML ophthalmic solution Place 1 drop into both eyes 2 (two) times daily.    07/08/2019 at 2200  . isosorbide mononitrate (IMDUR) 60 MG 24 hr tablet Take 60 mg by mouth daily.    07/08/2019 at 2200  . Melatonin 5 MG  CAPS Take 5 mg by mouth at bedtime as needed (for sleep).    Past Week at prn  . Multiple Vitamins-Minerals (PRESERVISION AREDS 2) CAPS Take 1 capsule by mouth 2 (two) times daily.   07/08/2019 at 2000  . nitroGLYCERIN (NITROSTAT) 0.4 MG SL tablet Place 0.4 mg under the tongue every 5 (five) minutes as needed for chest pain.    07/09/2019 at 0900  . Omega-3 Fatty Acids (FISH OIL) 1200 MG CAPS Take 1,200 mg by mouth daily.    07/08/2019 at 0900  . omeprazole (PRILOSEC) 20 MG capsule Take 20 mg by mouth daily.    07/09/2019 at 0900  . tamsulosin (FLOMAX) 0.4 MG CAPS capsule TAKE 1 CAPSULE BY MOUTH EVERY DAY (Patient taking differently: Take 0.4 mg by mouth daily. ) 30 capsule 11 07/08/2019 at 2000  . Travoprost, BAK Free, (TRAVATAN Z) 0.004 % SOLN ophthalmic solution Place 1 drop into both eyes at bedtime.    07/08/2019 at 2200  . Turmeric 500 MG TABS Take 1,000 mg by mouth daily.   07/08/2019 at 0900  . valsartan-hydrochlorothiazide (DIOVAN HCT) 320-25 MG per  tablet Take 1 tablet by mouth daily.    07/09/2019 at 0900  . vitamin B-12 (CYANOCOBALAMIN) 1000 MCG tablet Take 1,000 mcg by mouth daily.   07/08/2019 at 0900  . Glucosamine HCl (GLUCOSAMINE PO) Take 1 tablet by mouth 2 (two) times daily.   Not Taking at Unknown time  . Liniments (SALONPAS PAIN RELIEF PATCH EX) Apply 1 patch topically daily as needed (for pain).   unknown at prn  . Polyethyl Glycol-Propyl Glycol (SYSTANE OP) Place 1 drop into the right eye daily as needed (for blurriness).    unknown at prn    Current medications: Current Facility-Administered Medications  Medication Dose Route Frequency Provider Last Rate Last Admin  . acetaminophen (TYLENOL) tablet 650 mg  650 mg Oral Q4H PRN Para Skeans, MD   650 mg at 07/10/19 2122  . amiodarone (PACERONE) tablet 200 mg  200 mg Oral Daily Teodoro Spray, MD   200 mg at 07/12/19 0844  . apixaban (ELIQUIS) tablet 2.5 mg  2.5 mg Oral BID Teodoro Spray, MD   2.5 mg at 07/12/19 0844  . aspirin EC tablet  81 mg  81 mg Oral Daily Para Skeans, MD   81 mg at 07/12/19 0844  . atorvastatin (LIPITOR) tablet 80 mg  80 mg Oral q1800 Para Skeans, MD   80 mg at 07/11/19 1725  . chlorhexidine (PERIDEX) 0.12 % solution 15 mL  15 mL Mouth Rinse BID Darel Hong D, NP   15 mL at 07/12/19 0845  . Chlorhexidine Gluconate Cloth 2 % PADS 6 each  6 each Topical Daily Bradly Bienenstock, NP   6 each at 07/10/19 [phone]  . dorzolamide-timolol (COSOPT) 22.3-6.8 MG/ML ophthalmic solution 1 drop  1 drop Both Eyes BID Para Skeans, MD   1 drop at 07/12/19 0845  . latanoprost (XALATAN) 0.005 % ophthalmic solution 1 drop  1 drop Both Eyes QHS Para Skeans, MD   1 drop at 07/11/19 2204  . MEDLINE mouth rinse  15 mL Mouth Rinse q12n4p Darel Hong D, NP   15 mL at 07/11/19 1616  . Melatonin TABS 5 mg  5 mg Oral QHS PRN Para Skeans, MD      . multivitamin-lutein (OCUVITE-LUTEIN) capsule 1 capsule  1 capsule Oral BID Para Skeans, MD   1 capsule at 07/12/19 0844  . nitroGLYCERIN (NITROSTAT) SL tablet 0.4 mg  0.4 mg Sublingual Q5 min PRN Earleen Newport, MD   0.4 mg at 07/09/19 1147  . omega-3 acid ethyl esters (LOVAZA) capsule 1 g  1 g Oral BID Para Skeans, MD   1 g at 07/12/19 0844  . ondansetron (ZOFRAN) injection 4 mg  4 mg Intravenous Q6H PRN Para Skeans, MD      . polyvinyl alcohol (LIQUIFILM TEARS) 1.4 % ophthalmic solution   Right Eye Daily PRN Para Skeans, MD      . tamsulosin (FLOMAX) capsule 0.4 mg  0.4 mg Oral Daily Para Skeans, MD   0.4 mg at 07/12/19 0844      Allergies: Allergies  Allergen Reactions  . Penicillins Itching, Swelling, Rash and Other (See Comments)    Has patient had a PCN reaction causing immediate rash, facial/tongue/throat swelling, SOB or lightheadedness with hypotension: Yes Has patient had a PCN reaction causing severe rash involving mucus membranes or skin necrosis: No Has patient had a PCN reaction that required hospitalization No Has patient had a PCN reaction  occurring within the last 10 years: No If all of the above answers are "NO", then may proceed with Cephalosporin use.       Past Medical History: Past Medical History:  Diagnosis Date  . Absolute anemia 12/08/2014  . Allergy   . Anginal pain ([place])    much less angina d/t lack of activity  . Arthritis    all over body  . Bradycardia 05/03/2015  . Calculus of kidney 12/08/2014  . Coronary artery disease   . GERD (gastroesophageal reflux disease)   . Glaucoma   . History of kidney stones   . HLD (hyperlipidemia)   . Hyperglycemia 08/27/2015  . Hypertension   . Myocardial infarction ([place]) 2011  . Peripheral vascular disease ([place])   . VHD (valvular heart disease)      Past Surgical History: Past Surgical History:  Procedure Laterality Date  . ANTERIOR CERVICAL DECOMP/DISCECTOMY FUSION N/A 12/30/2017   Procedure: ANTERIOR CERVICAL DECOMPRESSION/DISCECTOMY FUSION 3 LEVELS- C3-6;  Surgeon: Meade Maw, MD;  Location: [facility] ORS;  Service: Neurosurgery;  Laterality: N/A;  . CARDIAC CATHETERIZATION  2007  . CARDIAC CATHETERIZATION N/A 04/17/2015   Procedure: Left Heart Cath and Coronary Angiography;  Surgeon: Corey Skains, MD;  Location: [facility];  Service: Cardiovascular;  Laterality: N/A;  . CARPAL TUNNEL RELEASE Bilateral    right 06/09/08 left 05/03/09  . CATARACT EXTRACTION W/ INTRAOCULAR LENS  IMPLANT, BILATERAL    . COLONOSCOPY N/A 10/02/2014   Procedure: COLONOSCOPY;  Surgeon: Manya Silvas, MD;  Location: [facility] ENDOSCOPY;  Service: Endoscopy;  Laterality: N/A;  . CORONARY ARTERY BYPASS GRAFT  2007  . ESOPHAGOGASTRODUODENOSCOPY    . EYE SURGERY    . HEMORROIDECTOMY    . JOINT REPLACEMENT Right 03/2017   partial knee replacement  . KNEE ARTHROSCOPY Right 08/05/2016   Procedure: ARTHROSCOPY KNEE;  Surgeon: Thornton Park, MD;  Location: [facility] ORS;  Service: Orthopedics;  Laterality: Right;  . KNEE ARTHROSCOPY WITH LATERAL RELEASE Right 08/05/2016   Procedure:  KNEE ARTHROSCOPY WITH LATERAL RELEASE;  Surgeon: Thornton Park, MD;  Location: [facility] ORS;  Service: Orthopedics;  Laterality: Right;  . KNEE ARTHROSCOPY WITH MEDIAL MENISECTOMY Right 08/05/2016   Procedure: KNEE ARTHROSCOPY WITH MEDIAL MENISECTOMY;  Surgeon: Thornton Park, MD;  Location: [facility] ORS;  Service: Orthopedics;  Laterality: Right;  Partial  . LUMBAR EPIDURAL INJECTION  2011  . LUMBAR LAMINECTOMY/DECOMPRESSION MICRODISCECTOMY N/A 03/10/2018   Procedure: LUMBAR LAMINECTOMY/DECOMPRESSION MICRODISCECTOMY 3 LEVELS-L2-5;  Surgeon: Meade Maw, MD;  Location: [facility] ORS;  Service: Neurosurgery;  Laterality: N/A;  . MOUTH SURGERY     tooth implant  . SHOULDER SURGERY Right 2011   rotator cuff repair  . SYNOVECTOMY Right 08/05/2016   Procedure: SYNOVECTOMY;  Surgeon: Thornton Park, MD;  Location: [facility] ORS;  Service: Orthopedics;  Laterality: Right;  arthroscopic extensive synovectomy  . TONSILLECTOMY       Family History: Family History  Problem Relation Age of Onset  . Heart disease Mother   . Heart disease Father   . Heart disease Brother   . Heart disease Brother   . Heart disease Brother   . Heart disease Brother   . Heart disease Brother      Social History: Social History   Socioeconomic History  . Marital status: Married    Spouse name: Blanch Media  . Number of children: 1  . Years of education: Not on file  . Highest education level: Some college, no degree  Occupational History  . Occupation: whole  plumbing Health and safety inspector    Employer: RETIRED  Tobacco Use  . Smoking status: Former Smoker    Years: 15.00    Types: Cigarettes    Quit date: 1990    Years since quitting: 31.1  . Smokeless tobacco: Never Used  Substance and Sexual Activity  . Alcohol use: Yes    Alcohol/week: 7.0 - 14.0 standard drinks    Types: 7 - 14 Glasses of wine per week    Comment: 1-2 glasses of wine a night  . Drug use: No  . Sexual activity: Not Currently  Other Topics Concern  .  Not on file  Social History Narrative  . Not on file   Social Determinants of Health   Financial Resource Strain: Low Risk   . Difficulty of Paying Living Expenses: Not hard at all  Food Insecurity: No Food Insecurity  . Worried About Charity fundraiser in the Last Year: Never true  . Ran Out of Food in the Last Year: Never true  Transportation Needs: No Transportation Needs  . Lack of Transportation (Medical): No  . Lack of Transportation (Non-Medical): No  Physical Activity: Inactive  . Days of Exercise per Week: 0 days  . Minutes of Exercise per Session: 0 min  Stress: No Stress Concern Present  . Feeling of Stress : Not at all  Social Connections: Unknown  . Frequency of Communication with Friends and Family: Patient refused  . Frequency of Social Gatherings with Friends and Family: Patient refused  . Attends Religious Services: Patient refused  . Active Member of Clubs or Organizations: Patient refused  . Attends Archivist Meetings: Patient refused  . Marital Status: Patient refused  Intimate Partner Violence: Unknown  . Fear of Current or Ex-Partner: Patient refused  . Emotionally Abused: Patient refused  . Physically Abused: Patient refused  . Sexually Abused: Patient refused     Review of Systems: Gen: Denies fever, chills HEENT: Denies headache. (+) dryness of the mouth CV: Denies chest pain, lower extremity edema. Resp: Denies SOB, cough. GI: (+) mild abdominal discomfort, resolved with BM GU : Denies urinary retention or blood in urine MS: Denies joint pain or muscle aches Derm: Denies rashes  Vital Signs: Blood pressure (!) 119/52, pulse 65, temperature 98.2 F (36.8 C), temperature source Oral, resp. rate 18, height 5\' 6"  (1.676 m), weight 74.1 kg, SpO2 95 %.   Intake/Output Summary (Last 24 hours) at 07/12/2019 1552 Last data filed at 07/12/2019 1315 Gross per 24 hour  Intake 1583.02 ml  Output 850 ml  Net 733.02 ml    Weight  trends: Filed Weights   07/11/19 0500 07/11/19 1831 07/12/19 0358  Weight: 76.9 kg 76.7 kg 74.1 kg    Physical Exam: General:  Awake, alert, appears comfortable with wife at bedside.  HEENT Head: normocephalic/atraumatic, Ears: hearing aids in place, no obvious hearing loss  Lungs: Respiratory effort normal, lungs clear to auscultation bilaterally  Heart::  Irregularly irregular rhythm  Abdomen: Soft, non-tender, non-distended  Extremities:  No lower extremity edema  Neurologic: Alert and oriented to person, place, time, and situation  Skin: Slightly decreased skin turgor. Warm, dry, no lesions    Lab results: Basic Metabolic Panel: Recent Labs  Lab 07/10/19 0953 07/11/19 0546 07/12/19 0249  NA 135 129* 130*  K 4.1 3.4* 3.6  CL 101 94* 96*  CO2 21* 21* 21*  GLUCOSE 101* 101* 114*  BUN 48* 66* 75*  CREATININE 2.51* 3.01* 3.12*  CALCIUM 8.4* 8.0*  7.7*  MG  --  2.2 2.0  PHOS  --  5.1*  --     Liver Function Tests: Recent Labs  Lab 07/09/19 1057  AST 22  ALT 24  ALKPHOS 82  BILITOT 1.9*  PROT 7.8  ALBUMIN 4.1   No results for input(s): LIPASE, AMYLASE in the last 168 hours. No results for input(s): AMMONIA in the last 168 hours.  CBC: Recent Labs  Lab 07/11/19 0546 07/12/19 0249  WBC 7.6 7.1  HGB 11.6* 11.4*  HCT 34.2* 33.2*  MCV 91.4 90.0  PLT 187 199    Cardiac Enzymes: No results for input(s): CKTOTAL, TROPONINI in the last 168 hours.  BNP: Invalid input(s): POCBNP  CBG: Recent Labs  Lab 07/09/19 1524  GLUCAP 141*    Microbiology: Recent Results (from the past 720 hour(s))  Respiratory Panel by RT PCR (Flu A&B, Covid) - Nasopharyngeal Swab     Status: None   Collection Time: 07/09/19 11:42 AM   Specimen: Nasopharyngeal Swab  Result Value Ref Range Status   SARS Coronavirus 2 by RT PCR NEGATIVE NEGATIVE Final    Comment: (NOTE) SARS-CoV-2 target nucleic acids are NOT DETECTED. The SARS-CoV-2 RNA is generally detectable in upper  respiratoy specimens during the acute phase of infection. The lowest concentration of SARS-CoV-2 viral copies this assay can detect is 131 copies/mL. A negative result does not preclude SARS-Cov-2 infection and should not be used as the sole basis for treatment or other patient management decisions. A negative result may occur with  improper specimen collection/handling, submission of specimen other than nasopharyngeal swab, presence of viral mutation(s) within the areas targeted by this assay, and inadequate number of viral copies (<131 copies/mL). A negative result must be combined with clinical observations, patient history, and epidemiological information. The expected result is Negative. Fact Sheet for Patients:  PinkCheek.be Fact Sheet for Healthcare Providers:  GravelBags.it This test is not yet ap proved or cleared by the [place] FDA and  has been authorized for detection and/or diagnosis of SARS-CoV-2 by FDA under an Emergency Use Authorization (EUA). This EUA will remain  in effect (meaning this test can be used) for the duration of the COVID-19 declaration under Section 564(b)(1) of the Act, 21 U.S.C. section 360bbb-3(b)(1), unless the authorization is terminated or revoked sooner.    Influenza A by PCR NEGATIVE NEGATIVE Final   Influenza B by PCR NEGATIVE NEGATIVE Final    Comment: (NOTE) The Xpert Xpress SARS-CoV-2/FLU/RSV assay is intended as an aid in  the diagnosis of influenza from Nasopharyngeal swab specimens and  should not be used as a sole basis for treatment. Nasal washings and  aspirates are unacceptable for Xpert Xpress SARS-CoV-2/FLU/RSV  testing. Fact Sheet for Patients: PinkCheek.be Fact Sheet for Healthcare Providers: GravelBags.it This test is not yet approved or cleared by the [place] FDA and  has been authorized for  detection and/or diagnosis of SARS-CoV-2 by  FDA under an Emergency Use Authorization (EUA). This EUA will remain  in effect (meaning this test can be used) for the duration of the  Covid-19 declaration under Section 564(b)(1) of the Act, 21  U.S.C. section 360bbb-3(b)(1), unless the authorization is  terminated or revoked. Performed at [facility] [place],  [address]   MRSA PCR Screening     Status: None   Collection Time: 07/09/19  3:29 PM   Specimen: Nasopharyngeal  Result Value Ref Range Status   MRSA by PCR NEGATIVE NEGATIVE Final  Comment:        The GeneXpert MRSA Assay (FDA approved for NASAL specimens only), is one component of a comprehensive MRSA colonization surveillance program. It is not intended to diagnose MRSA infection nor to guide or monitor treatment for MRSA infections. Performed at [facility], [address] [place], [place] [address]   CULTURE, BLOOD (ROUTINE X 2) w Reflex to ID Panel     Status: None (Preliminary result)   Collection Time: 07/09/19  7:00 PM   Specimen: BLOOD  Result Value Ref Range Status   Specimen Description BLOOD LEFT HAND  Final   Special Requests   Final    BOTTLES DRAWN AEROBIC AND ANAEROBIC Blood Culture adequate volume   Culture   Final    NO GROWTH 3 DAYS Performed at [facility], [address] [place], [place] [address]    Report Status PENDING  Incomplete  CULTURE, BLOOD (ROUTINE X 2) w Reflex to ID Panel     Status: None (Preliminary result)   Collection Time: 07/09/19  7:15 PM   Specimen: BLOOD  Result Value Ref Range Status   Specimen Description BLOOD LEFT ANTECUBITAL  Final   Special Requests   Final    BOTTLES DRAWN AEROBIC AND ANAEROBIC Blood Culture adequate volume   Culture   Final    NO GROWTH 3 DAYS Performed at [facility], [address] [place], [place] [address]    Report Status PENDING  Incomplete     Coagulation  Studies: No results for input(s): LABPROT, INR in the last 72 hours.  Urinalysis: No results for input(s): COLORURINE, LABSPEC, PHURINE, GLUCOSEU, HGBUR, BILIRUBINUR, KETONESUR, PROTEINUR, UROBILINOGEN, NITRITE, LEUKOCYTESUR in the last 72 hours.  Invalid input(s): APPERANCEUR      Imaging:  No results found.   Assessment & Plan: Pt is a 86 y.o.   male with PMHx of CAD s/p CABG x3 with unstable angina, hypertension, hyperlipidemia, atrial fibrillation, carotid artery stenosis, mild aortic stenosis, peripheral vascular disease, CKD stage IIB was admitted on 07/09/2019 with NSTEMI.  Nephrology is consulted for AKI.  Lasix vs renal hypoperfusion is thought to be a contributing factor and the diuretic has now been discontinued after administration of 4 doses on 07/10/19 and 1 dose on 07/11/19.  #Acute kidney injury on chronic kidney disease stage IIIB - Creatinine 3.12/GFR 17 today, baseline creatinine ~1.6/GFR 37 on 11/23/2018. - Urinalysis, Renal Ultrasound ordered to rule out intrinsic or postrenal causes of AKI. - Agree with holding Lasix, valsartan, and any other nephrotoxic medications due to AKI on CKD stage IIIB.  No indication for acute dialysis at this time.       LOS: [place] 2/9/20213:52 PM    Note: This note was prepared with Dragon dictation. Any transcription errors are unintentional

## 2019-07-12 NOTE — Progress Notes (Signed)
[place] for heparin Indication: chest pain/ACS  Allergies  Allergen Reactions  . Penicillins Itching, Swelling, Rash and Other (See Comments)    Has patient had a PCN reaction causing immediate rash, facial/tongue/throat swelling, SOB or lightheadedness with hypotension: Yes Has patient had a PCN reaction causing severe rash involving mucus membranes or skin necrosis: No Has patient had a PCN reaction that required hospitalization No Has patient had a PCN reaction occurring within the last 10 years: No If all of the above answers are "NO", then may proceed with Cephalosporin use.     Patient Measurements: Height: 5\' 6"  (167.6 cm) Weight: 169 lb 1.6 oz (76.7 kg) IBW/kg (Calculated) : 63.8 Heparin Dosing Weight: 77 kg  Vital Signs: Temp: 98.7 F (37.1 C) (02/08 1940) Temp Source: Oral (02/08 1940) BP: 113/65 (02/09 0043) Pulse Rate: 62 (02/09 0043)  Labs: Recent Labs     0000 07/09/19 1057 07/09/19 1113 07/09/19 1142 07/09/19 1354 07/09/19 1900 07/09/19 2233 07/10/19 0512 07/10/19 0953 07/10/19 2144 07/11/19 0546 07/11/19 1637 07/12/19 0249  HGB   < > 12.5*  --   --   --   --    < > 11.3*  --   --  11.6*  --  11.4*  HCT   < > 37.7*  --   --   --   --   --  34.3*  --   --  34.2*  --  33.2*  PLT   < > 164  --   --   --   --   --  173  --   --  187  --  199  APTT  --   --  46*  --   --   --   --   --   --   --   --   --   --   LABPROT  --   --  15.7*  --   --   --   --   --   --   --   --   --   --   INR  --   --  1.3*  --   --   --   --   --   --   --   --   --   --   HEPARINUNFRC  --   --   --    < >  --   --    < >  --  0.18*   < > 0.31 0.21* 0.48  CREATININE   < > 2.07*  --   --   --   --    < > 2.39* 2.51*  --  3.01*  --  3.12*  TROPONINIHS  --  41*  --   --  51* 240*  --   --   --   --   --   --   --    < > = values in this interval not displayed.    Estimated Creatinine Clearance: 16 mL/min (A) (by C-G formula based on  SCr of 3.12 mg/dL (H)).   Medical History: Past Medical History:  Diagnosis Date  . Absolute anemia 12/08/2014  . Allergy   . Anginal pain ([place])    much less angina d/t lack of activity  . Arthritis    all over body  . Bradycardia 05/03/2015  . Calculus of kidney 12/08/2014  . Coronary artery disease   . GERD (  gastroesophageal reflux disease)   . Glaucoma   . History of kidney stones   . HLD (hyperlipidemia)   . Hyperglycemia 08/27/2015  . Hypertension   . Myocardial infarction ([place]) 2011  . Peripheral vascular disease ([place])   . VHD (valvular heart disease)    Assessment: Pharmacy consulted for heparin drip management for 86 yo male admitted with history of chest pain over past several days. Patient with past medical history significant for CAD, CABG, hypertension and anemia. Patient is not on anticoagulation as an outpatient. Patient with new onset atrial fibrillation and CHF. Patient with CHA2DS2-VASc Score of at least 4 (CHF, Age > 11, HTN). Heparin is infusing at 1500 units/hr.   Heparin level in range. Will continue patient on heparin 1500 units/hr. Will obtain confirmatory level at 1600. CBC with am labs.   Goal of Therapy:  Heparin level 0.3-0.7 units/ml Monitor platelets by anticoagulation protocol: Yes   Plan:  02/09 @ 0300 HL 0.48 therapeutic. Will continue current rate and will recheck HL at 1100, CBC stable will continue to monitor.   Tobie Lords, PharmD, BCPS Clinical Pharmacist 07/12/2019

## 2019-07-12 NOTE — Progress Notes (Addendum)
PROGRESS NOTE    Austin [name]  MWN:[id number] DOB: 1929/01/18 DOA: 07/09/2019 PCP: Jerrol Banana., MD    Assessment & Plan:   Principal Problem:   Coronary artery disease involving native coronary artery of native heart with unstable angina pectoris [facility]) Active Problems:   Benign essential HTN   Acid reflux   Glaucoma   Borderline diabetes   NSTEMI (non-ST elevated myocardial infarction) ([place])   Atrial fibrillation ([place])  Austin [name] is a 86 y.o. Caucasian male with medical problems of CAD, CABG, A Fib, who was admitted to [facility] on 07/09/2019 for evaluation of shortness of breath and chest pain.  Hospital course complicated by hypoxia and worsening chest pain requiring BiPAP and ICU monitoring.  Pt was transferred to hospitalist service on 07/11/19.   Acute Hypoxic Respiratory Failure, improved -due to florid pulmonary edema secondary to acute on chronic diastolic CHF as well as renal insufficiency.  Weaned off BiPAP to 4L and transferred to hospitalist service on 07/11/19. -TTE during this admission showed relatively normal LVEF and right heart function. --Was receiving aggressive IV diuretic.  Lasix d/c'ed on 2/8 due to Cr trending up. PLAN: -continue Bronchodilator Therapy --Hold diuretic since Cr trending up --Strict I/O --wean O2  Non-ST elevation MI -History of severe CAD status post CABG -Cardiology on case-appreciate input --further ischemic eval per cards.  Renal Failure-acute on chronic stage IIIb --Cr 2.07 on presentation, Baseline Cr 1.64/GFR 37.   trending up to 3's. Likely due to hypoperfusion from Afib and then aggressive diuretic. --Hold diuretic for now --Hold ARB --Nephrology consult  # Atrial fibrillation Per cards, "Apparently new. May have begun prior to admission and loss of atrial kick may have exacerbated his clinical decline." --Cardiology following --s/p amio load --heparin gtt transitioned to Eliquis   DVT prophylaxis: Heparin  SQ Code Status: Full code  Family Communication: wife updated at bedside Disposition Plan: Likely home, but needs to see improvement in Cr.    Subjective and Interval History:  Pt had no complaints, except of not being able to get out of bed.  No fever, chest pain, abdominal pain, N/V/D, dysuria, increased swelling.     Objective: Vitals:   07/12/19 0358 07/12/19 0806 07/12/19 1059 07/12/19 1954  BP: (!) 108/47 (!) 115/59 (!) 119/52 (!) 112/47  Pulse: 65 60 65 62  Resp:  18 18 18   Temp: 97.7 F (36.5 C) 98.2 F (36.8 C)  98.3 F (36.8 C)  TempSrc: Oral Oral  Oral  SpO2: 95% 94% 95% 98%  Weight: 74.1 kg     Height:        Intake/Output Summary (Last 24 hours) at 07/12/2019 2049 Last data filed at 07/12/2019 1900 Gross per 24 hour  Intake 920.15 ml  Output 950 ml  Net -29.85 ml   Filed Weights   07/11/19 0500 07/11/19 1831 07/12/19 0358  Weight: 76.9 kg 76.7 kg 74.1 kg    Examination:   Constitutional: NAD, AAOx3 HEENT: conjunctivae and lids normal, EOMI CV: irregularly irregular no M,R,G. Distal pulses +2.  No cyanosis.   RESP: No obvious crackles, normal respiratory effort, on 2L GI: +BS, NTND Extremities: No effusions, edema, or tenderness in BLE SKIN: warm, dry and intact Neuro: II - XII grossly intact.  Sensation intact Psych: Normal mood and affect.  Appropriate judgement and reason   Data Reviewed: I have personally reviewed following labs and imaging studies  CBC: Recent Labs  Lab 07/09/19 1057 07/10/19 0512 07/11/19 0546 07/12/19 0249  WBC 10.0 9.2 7.6 7.1  HGB 12.5* 11.3* 11.6* 11.4*  HCT 37.7* 34.3* 34.2* 33.2*  MCV 92.9 92.5 91.4 90.0  PLT 164 173 187 093   Basic Metabolic Panel: Recent Labs  Lab 07/09/19 1057 07/10/19 0512 07/10/19 0953 07/11/19 0546 07/12/19 0249  NA 135 136 135 129* 130*  K 4.2 4.0 4.1 3.4* 3.6  CL 102 102 101 94* 96*  CO2 18* 22 21* 21* 21*  GLUCOSE 137* 118* 101* 101* 114*  BUN 41* 45* 48* 66* 75*  CREATININE  2.07* 2.39* 2.51* 3.01* 3.12*  CALCIUM 9.1 8.3* 8.4* 8.0* 7.7*  MG  --   --   --  2.2 2.0  PHOS  --   --   --  5.1*  --    GFR: Estimated Creatinine Clearance: 14.8 mL/min (A) (by C-G formula based on SCr of 3.12 mg/dL (H)). Liver Function Tests: Recent Labs  Lab 07/09/19 1057  AST 22  ALT 24  ALKPHOS 82  BILITOT 1.9*  PROT 7.8  ALBUMIN 4.1   No results for input(s): LIPASE, AMYLASE in the last 168 hours. No results for input(s): AMMONIA in the last 168 hours. Coagulation Profile: Recent Labs  Lab 07/09/19 1113  INR 1.3*   Cardiac Enzymes: No results for input(s): CKTOTAL, CKMB, CKMBINDEX, TROPONINI in the last 168 hours. BNP (last 3 results) No results for input(s): PROBNP in the last 8760 hours. HbA1C: No results for input(s): HGBA1C in the last 72 hours. CBG: Recent Labs  Lab 07/09/19 1524  GLUCAP 141*   Lipid Profile: No results for input(s): CHOL, HDL, LDLCALC, TRIG, CHOLHDL, LDLDIRECT in the last 72 hours. Thyroid Function Tests: No results for input(s): TSH, T4TOTAL, FREET4, T3FREE, THYROIDAB in the last 72 hours. Anemia Panel: No results for input(s): VITAMINB12, FOLATE, FERRITIN, TIBC, IRON, RETICCTPCT in the last 72 hours. Sepsis Labs: Recent Labs  Lab 07/09/19 1900  PROCALCITON 0.18    Recent Results (from the past 240 hour(s))  Respiratory Panel by RT PCR (Flu A&B, Covid) - Nasopharyngeal Swab     Status: None   Collection Time: 07/09/19 11:42 AM   Specimen: Nasopharyngeal Swab  Result Value Ref Range Status   SARS Coronavirus 2 by RT PCR NEGATIVE NEGATIVE Final    Comment: (NOTE) SARS-CoV-2 target nucleic acids are NOT DETECTED. The SARS-CoV-2 RNA is generally detectable in upper respiratoy specimens during the acute phase of infection. The lowest concentration of SARS-CoV-2 viral copies this assay can detect is 131 copies/mL. A negative result does not preclude SARS-Cov-2 infection and should not be used as the sole basis for treatment  or other patient management decisions. A negative result may occur with  improper specimen collection/handling, submission of specimen other than nasopharyngeal swab, presence of viral mutation(s) within the areas targeted by this assay, and inadequate number of viral copies (<131 copies/mL). A negative result must be combined with clinical observations, patient history, and epidemiological information. The expected result is Negative. Fact Sheet for Patients:  PinkCheek.be Fact Sheet for Healthcare Providers:  GravelBags.it This test is not yet ap proved or cleared by the [place] FDA and  has been authorized for detection and/or diagnosis of SARS-CoV-2 by FDA under an Emergency Use Authorization (EUA). This EUA will remain  in effect (meaning this test can be used) for the duration of the COVID-19 declaration under Section 564(b)(1) of the Act, 21 U.S.C. section 360bbb-3(b)(1), unless the authorization is terminated or revoked sooner.    Influenza A by PCR NEGATIVE  NEGATIVE Final   Influenza B by PCR NEGATIVE NEGATIVE Final    Comment: (NOTE) The Xpert Xpress SARS-CoV-2/FLU/RSV assay is intended as an aid in  the diagnosis of influenza from Nasopharyngeal swab specimens and  should not be used as a sole basis for treatment. Nasal washings and  aspirates are unacceptable for Xpert Xpress SARS-CoV-2/FLU/RSV  testing. Fact Sheet for Patients: PinkCheek.be Fact Sheet for Healthcare Providers: GravelBags.it This test is not yet approved or cleared by the [place] FDA and  has been authorized for detection and/or diagnosis of SARS-CoV-2 by  FDA under an Emergency Use Authorization (EUA). This EUA will remain  in effect (meaning this test can be used) for the duration of the  Covid-19 declaration under Section 564(b)(1) of the Act, 21  U.S.C. section  360bbb-3(b)(1), unless the authorization is  terminated or revoked. Performed at [facility], [address] [place], [place] [address]   MRSA PCR Screening     Status: None   Collection Time: 07/09/19  3:29 PM   Specimen: Nasopharyngeal  Result Value Ref Range Status   MRSA by PCR NEGATIVE NEGATIVE Final    Comment:        The GeneXpert MRSA Assay (FDA approved for NASAL specimens only), is one component of a comprehensive MRSA colonization surveillance program. It is not intended to diagnose MRSA infection nor to guide or monitor treatment for MRSA infections. Performed at [facility], [address] [place], [place] [address]   CULTURE, BLOOD (ROUTINE X 2) w Reflex to ID Panel     Status: None (Preliminary result)   Collection Time: 07/09/19  7:00 PM   Specimen: BLOOD  Result Value Ref Range Status   Specimen Description BLOOD LEFT HAND  Final   Special Requests   Final    BOTTLES DRAWN AEROBIC AND ANAEROBIC Blood Culture adequate volume   Culture   Final    NO GROWTH 3 DAYS Performed at [facility], [address], [place], [place] [address]    Report Status PENDING  Incomplete  CULTURE, BLOOD (ROUTINE X 2) w Reflex to ID Panel     Status: None (Preliminary result)   Collection Time: 07/09/19  7:15 PM   Specimen: BLOOD  Result Value Ref Range Status   Specimen Description BLOOD LEFT ANTECUBITAL  Final   Special Requests   Final    BOTTLES DRAWN AEROBIC AND ANAEROBIC Blood Culture adequate volume   Culture   Final    NO GROWTH 3 DAYS Performed at [facility], [address] [place], [place] [address]    Report Status PENDING  Incomplete      Radiology Studies: US RENAL  Result Date: 07/12/2019 CLINICAL DATA:  Acute renal failure. EXAM: RENAL / URINARY TRACT ULTRASOUND COMPLETE COMPARISON:  None. FINDINGS: Right Kidney: Renal measurements: 9.4 x 4.6 x 4.1 cm = volume: 92 mL. Diffuse cortical thinning with prominent  renal sinus fat. Echogenicity within normal limits. No mass or hydronephrosis visualized. Left Kidney: Renal measurements: 10.4 x 5.6 x 5.5 cm = volume: 166 mL. Diffuse cortical thinning with prominent renal sinus fat. 3.1 cm upper pole cyst. Echogenicity within normal limits. No mass or hydronephrosis visualized. Bladder: Appears normal for degree of bladder distention. Other: None. IMPRESSION: Moderate bilateral renal cortical atrophy.  No hydronephrosis. Electronically Signed   By: Claudie Revering M.D.   On: 07/12/2019 16:49     Scheduled Meds: . amiodarone  200 mg Oral Daily  . apixaban  2.5 mg Oral BID  . aspirin EC  81 mg Oral Daily  . atorvastatin  80 mg Oral q1800  . chlorhexidine  15 mL Mouth Rinse BID  . Chlorhexidine Gluconate Cloth  6 each Topical Daily  . dorzolamide-timolol  1 drop Both Eyes BID  . latanoprost  1 drop Both Eyes QHS  . mouth rinse  15 mL Mouth Rinse q12n4p  . multivitamin-lutein  1 capsule Oral BID  . omega-3 acid ethyl esters  1 g Oral BID  . tamsulosin  0.4 mg Oral Daily   Continuous Infusions:    LOS: 3 days     Enzo Bi, MD [facility] If 7PM-7AM, please contact night-coverage 07/12/2019, 8:49 PM

## 2019-07-12 NOTE — Consult Note (Signed)
[address] [place], [place] [address] Phone [phone]. Fax [phone]  Date: 07/12/2019                  Patient Name:  Austin [name]  MRN: [id number]  DOB: [birth year]  Age / Sex: 86 y.o., male         PCP: Jerrol Banana., MD                 Service Requesting Consult: IM/ Enzo Bi, MD                 Reason for Consult: AKI            History of Present Illness: Patient is a 86 y.o. male with medical problems of CAD, CABG, A Fib, who was admitted to [facility] on 07/09/2019 for evaluation of shortness of breath and chest pain.  Hospital course complicated by hypoxia and worsening chest pain requiring ICU monitoring.  Patient is diagnosed with non-STEMI. Chest x-ray showed bilateral pulmonary edema therefore patient received multiple doses of lasix on 2/7 and once on 2/8, then dc'd Lab Results  Component Value Date   CREATININE 3.12 (H) 07/12/2019   CREATININE 3.01 (H) 07/11/2019   CREATININE 2.51 (H) 07/10/2019   Creatinine treatment is summarized above    Medications: Outpatient medications: Medications Prior to Admission  Medication Sig Dispense Refill Last Dose  . amLODipine (NORVASC) 5 MG tablet Take 5 mg by mouth daily.   07/08/2019 at 2200  . atorvastatin (LIPITOR) 20 MG tablet Take 20 mg by mouth daily.    07/08/2019 at 2000  . Biotin 5000 MCG CAPS Take 5,000 mcg by mouth daily.    07/08/2019 at 0900  . Cholecalciferol (VITAMIN D3) 2000 UNITS capsule Take 2,000 Units by mouth 2 (two) times daily.    07/08/2019 at 0900  . Coenzyme Q10 (COQ-10) 200 MG CAPS Take 200 mg by mouth daily.   07/08/2019 at 0900  . dorzolamide-timolol (COSOPT) 22.3-6.8 MG/ML ophthalmic solution Place 1 drop into both eyes 2 (two) times daily.    07/08/2019 at 2200  . isosorbide mononitrate (IMDUR) 60 MG 24 hr tablet Take 60 mg by mouth daily.    07/08/2019 at 2200  . Melatonin 5 MG CAPS Take 5 mg by mouth at bedtime as needed (for sleep).    Past Week at prn  . Multiple  Vitamins-Minerals (PRESERVISION AREDS 2) CAPS Take 1 capsule by mouth 2 (two) times daily.   07/08/2019 at 2000  . nitroGLYCERIN (NITROSTAT) 0.4 MG SL tablet Place 0.4 mg under the tongue every 5 (five) minutes as needed for chest pain.    07/09/2019 at 0900  . Omega-3 Fatty Acids (FISH OIL) 1200 MG CAPS Take 1,200 mg by mouth daily.    07/08/2019 at 0900  . omeprazole (PRILOSEC) 20 MG capsule Take 20 mg by mouth daily.    07/09/2019 at 0900  . tamsulosin (FLOMAX) 0.4 MG CAPS capsule TAKE 1 CAPSULE BY MOUTH EVERY DAY (Patient taking differently: Take 0.4 mg by mouth daily. ) 30 capsule 11 07/08/2019 at 2000  . Travoprost, BAK Free, (TRAVATAN Z) 0.004 % SOLN ophthalmic solution Place 1 drop into both eyes at bedtime.    07/08/2019 at 2200  . Turmeric 500 MG TABS Take 1,000 mg by mouth daily.   07/08/2019 at 0900  . valsartan-hydrochlorothiazide (DIOVAN HCT) 320-25 MG per tablet Take 1 tablet by mouth daily.    07/09/2019 at 0900  . vitamin B-12 (CYANOCOBALAMIN)  1000 MCG tablet Take 1,000 mcg by mouth daily.   07/08/2019 at 0900  . Glucosamine HCl (GLUCOSAMINE PO) Take 1 tablet by mouth 2 (two) times daily.   Not Taking at Unknown time  . Liniments (SALONPAS PAIN RELIEF PATCH EX) Apply 1 patch topically daily as needed (for pain).   unknown at prn  . Polyethyl Glycol-Propyl Glycol (SYSTANE OP) Place 1 drop into the right eye daily as needed (for blurriness).    unknown at prn    Current medications: Current Facility-Administered Medications  Medication Dose Route Frequency Provider Last Rate Last Admin  . acetaminophen (TYLENOL) tablet 650 mg  650 mg Oral Q4H PRN Para Skeans, MD   650 mg at 07/10/19 2122  . amiodarone (PACERONE) tablet 200 mg  200 mg Oral Daily Teodoro Spray, MD   200 mg at 07/12/19 0844  . apixaban (ELIQUIS) tablet 2.5 mg  2.5 mg Oral BID Teodoro Spray, MD   2.5 mg at 07/12/19 0844  . aspirin EC tablet 81 mg  81 mg Oral Daily Para Skeans, MD   81 mg at 07/12/19 0844  . atorvastatin  (LIPITOR) tablet 80 mg  80 mg Oral q1800 Para Skeans, MD   80 mg at 07/11/19 1725  . chlorhexidine (PERIDEX) 0.12 % solution 15 mL  15 mL Mouth Rinse BID Darel Hong D, NP   15 mL at 07/12/19 0845  . Chlorhexidine Gluconate Cloth 2 % PADS 6 each  6 each Topical Daily Bradly Bienenstock, NP   6 each at 07/10/19 [phone]  . dorzolamide-timolol (COSOPT) 22.3-6.8 MG/ML ophthalmic solution 1 drop  1 drop Both Eyes BID Para Skeans, MD   1 drop at 07/12/19 0845  . latanoprost (XALATAN) 0.005 % ophthalmic solution 1 drop  1 drop Both Eyes QHS Para Skeans, MD   1 drop at 07/11/19 2204  . MEDLINE mouth rinse  15 mL Mouth Rinse q12n4p Darel Hong D, NP   15 mL at 07/11/19 1616  . Melatonin TABS 5 mg  5 mg Oral QHS PRN Para Skeans, MD      . multivitamin-lutein (OCUVITE-LUTEIN) capsule 1 capsule  1 capsule Oral BID Para Skeans, MD   1 capsule at 07/12/19 0844  . nitroGLYCERIN (NITROSTAT) SL tablet 0.4 mg  0.4 mg Sublingual Q5 min PRN Earleen Newport, MD   0.4 mg at 07/09/19 1147  . omega-3 acid ethyl esters (LOVAZA) capsule 1 g  1 g Oral BID Para Skeans, MD   1 g at 07/12/19 0844  . ondansetron (ZOFRAN) injection 4 mg  4 mg Intravenous Q6H PRN Para Skeans, MD      . polyvinyl alcohol (LIQUIFILM TEARS) 1.4 % ophthalmic solution   Right Eye Daily PRN Para Skeans, MD      . tamsulosin (FLOMAX) capsule 0.4 mg  0.4 mg Oral Daily Para Skeans, MD   0.4 mg at 07/12/19 0844      Allergies: Allergies  Allergen Reactions  . Penicillins Itching, Swelling, Rash and Other (See Comments)    Has patient had a PCN reaction causing immediate rash, facial/tongue/throat swelling, SOB or lightheadedness with hypotension: Yes Has patient had a PCN reaction causing severe rash involving mucus membranes or skin necrosis: No Has patient had a PCN reaction that required hospitalization No Has patient had a PCN reaction occurring within the last 10 years: No If all of the above answers are "NO", then may  proceed  with Cephalosporin use.       Past Medical History: Past Medical History:  Diagnosis Date  . Absolute anemia 12/08/2014  . Allergy   . Anginal pain ([place])    much less angina d/t lack of activity  . Arthritis    all over body  . Bradycardia 05/03/2015  . Calculus of kidney 12/08/2014  . Coronary artery disease   . GERD (gastroesophageal reflux disease)   . Glaucoma   . History of kidney stones   . HLD (hyperlipidemia)   . Hyperglycemia 08/27/2015  . Hypertension   . Myocardial infarction ([place]) 2011  . Peripheral vascular disease ([place])   . VHD (valvular heart disease)      Past Surgical History: Past Surgical History:  Procedure Laterality Date  . ANTERIOR CERVICAL DECOMP/DISCECTOMY FUSION N/A 12/30/2017   Procedure: ANTERIOR CERVICAL DECOMPRESSION/DISCECTOMY FUSION 3 LEVELS- C3-6;  Surgeon: Meade Maw, MD;  Location: [facility] ORS;  Service: Neurosurgery;  Laterality: N/A;  . CARDIAC CATHETERIZATION  2007  . CARDIAC CATHETERIZATION N/A 04/17/2015   Procedure: Left Heart Cath and Coronary Angiography;  Surgeon: Corey Skains, MD;  Location: [facility];  Service: Cardiovascular;  Laterality: N/A;  . CARPAL TUNNEL RELEASE Bilateral    right 06/09/08 left 05/03/09  . CATARACT EXTRACTION W/ INTRAOCULAR LENS  IMPLANT, BILATERAL    . COLONOSCOPY N/A 10/02/2014   Procedure: COLONOSCOPY;  Surgeon: Manya Silvas, MD;  [facility];  Service: Endoscopy;  Laterality: N/A;  . CORONARY ARTERY BYPASS GRAFT  2007  . ESOPHAGOGASTRODUODENOSCOPY    . EYE SURGERY    . HEMORROIDECTOMY    . JOINT REPLACEMENT Right 03/2017   partial knee replacement  . KNEE ARTHROSCOPY Right 08/05/2016   Procedure: ARTHROSCOPY KNEE;  Surgeon: Thornton Park, MD;  Location: [facility] ORS;  Service: Orthopedics;  Laterality: Right;  . KNEE ARTHROSCOPY WITH LATERAL RELEASE Right 08/05/2016   Procedure: KNEE ARTHROSCOPY WITH LATERAL RELEASE;  Surgeon: Thornton Park, MD;  Location: [facility]  ORS;  Service: Orthopedics;  Laterality: Right;  . KNEE ARTHROSCOPY WITH MEDIAL MENISECTOMY Right 08/05/2016   Procedure: KNEE ARTHROSCOPY WITH MEDIAL MENISECTOMY;  Surgeon: Thornton Park, MD;  Location: [facility] ORS;  Service: Orthopedics;  Laterality: Right;  Partial  . LUMBAR EPIDURAL INJECTION  2011  . LUMBAR LAMINECTOMY/DECOMPRESSION MICRODISCECTOMY N/A 03/10/2018   Procedure: LUMBAR LAMINECTOMY/DECOMPRESSION MICRODISCECTOMY 3 LEVELS-L2-5;  Surgeon: Meade Maw, MD;  Location: [facility] ORS;  Service: Neurosurgery;  Laterality: N/A;  . MOUTH SURGERY     tooth implant  . SHOULDER SURGERY Right 2011   rotator cuff repair  . SYNOVECTOMY Right 08/05/2016   Procedure: SYNOVECTOMY;  Surgeon: Thornton Park, MD;  Location: [facility] ORS;  Service: Orthopedics;  Laterality: Right;  arthroscopic extensive synovectomy  . TONSILLECTOMY       Family History: Family History  Problem Relation Age of Onset  . Heart disease Mother   . Heart disease Father   . Heart disease Brother   . Heart disease Brother   . Heart disease Brother   . Heart disease Brother   . Heart disease Brother      Social History: Social History   Socioeconomic History  . Marital status: Married    Spouse name: Blanch Media  . Number of children: 1  . Years of education: Not on file  . Highest education level: Some college, no degree  Occupational History  . Occupation: Engineer, water: RETIRED  Tobacco Use  . Smoking status: Former Smoker  Years: 15.00    Types: Cigarettes    Quit date: 31    Years since quitting: 31.1  . Smokeless tobacco: Never Used  Substance and Sexual Activity  . Alcohol use: Yes    Alcohol/week: 7.0 - 14.0 standard drinks    Types: 7 - 14 Glasses of wine per week    Comment: 1-2 glasses of wine a night  . Drug use: No  . Sexual activity: Not Currently  Other Topics Concern  . Not on file  Social History Narrative  . Not on file   Social Determinants of  Health   Financial Resource Strain: Low Risk   . Difficulty of Paying Living Expenses: Not hard at all  Food Insecurity: No Food Insecurity  . Worried About Charity fundraiser in the Last Year: Never true  . Ran Out of Food in the Last Year: Never true  Transportation Needs: No Transportation Needs  . Lack of Transportation (Medical): No  . Lack of Transportation (Non-Medical): No  Physical Activity: Inactive  . Days of Exercise per Week: 0 days  . Minutes of Exercise per Session: 0 min  Stress: No Stress Concern Present  . Feeling of Stress : Not at all  Social Connections: Unknown  . Frequency of Communication with Friends and Family: Patient refused  . Frequency of Social Gatherings with Friends and Family: Patient refused  . Attends Religious Services: Patient refused  . Active Member of Clubs or Organizations: Patient refused  . Attends Archivist Meetings: Patient refused  . Marital Status: Patient refused  Intimate Partner Violence: Unknown  . Fear of Current or Ex-Partner: Patient refused  . Emotionally Abused: Patient refused  . Physically Abused: Patient refused  . Sexually Abused: Patient refused     Review of Systems: Gen: Denies any fevers or chills HEENT: No vision or hearing complaints CV: Chest pain and shortness of breath have improved significantly Resp: No cough or sputum production GI: Appetite is good.  No nausea, vomiting or diarrhea.  No blood in the stool GU : Has history of prostate problems.  No problems voiding at present no blood in the urine.  Has history of kidney stones in the past MS: No complaints Derm:    No complaints Psych: No complaints Heme: No complaints Neuro: No complaints Endocrine.  No complaints  Vital Signs: Blood pressure (!) 119/52, pulse 65, temperature 98.2 F (36.8 C), temperature source Oral, resp. rate 18, height 5\' 6"  (1.676 m), weight 74.1 kg, SpO2 95 %.   Intake/Output Summary (Last 24 hours) at  07/12/2019 1506 Last data filed at 07/12/2019 1315 Gross per 24 hour  Intake 1583.02 ml  Output 850 ml  Net 733.02 ml    Weight trends: Filed Weights   07/11/19 0500 07/11/19 1831 07/12/19 0358  Weight: 76.9 kg 76.7 kg 74.1 kg    Physical Exam: General:  No acute distress, lying in the bed  HEENT  dry oral mucous membranes, anicteric  Neck:  Supple, no JVD  Lungs:  Normal breathing effort, clear to auscultation  Heart::  Irregularly irregular rhythm, no rub  Abdomen:  Soft, nontender  Extremities:  No peripheral edema  Neurologic:  Alert, oriented  Skin:  Decreased turgor    Lab results: Basic Metabolic Panel: Recent Labs  Lab 07/10/19 0953 07/11/19 0546 07/12/19 0249  NA 135 129* 130*  K 4.1 3.4* 3.6  CL 101 94* 96*  CO2 21* 21* 21*  GLUCOSE 101* 101* 114*  BUN  48* 66* 75*  CREATININE 2.51* 3.01* 3.12*  CALCIUM 8.4* 8.0* 7.7*  MG  --  2.2 2.0  PHOS  --  5.1*  --     Liver Function Tests: Recent Labs  Lab 07/09/19 1057  AST 22  ALT 24  ALKPHOS 82  BILITOT 1.9*  PROT 7.8  ALBUMIN 4.1   No results for input(s): LIPASE, AMYLASE in the last 168 hours. No results for input(s): AMMONIA in the last 168 hours.  CBC: Recent Labs  Lab 07/11/19 0546 07/12/19 0249  WBC 7.6 7.1  HGB 11.6* 11.4*  HCT 34.2* 33.2*  MCV 91.4 90.0  PLT 187 199    Cardiac Enzymes: No results for input(s): CKTOTAL, TROPONINI in the last 168 hours.  BNP: Invalid input(s): POCBNP  CBG: Recent Labs  Lab 07/09/19 1524  GLUCAP 141*    Microbiology: Recent Results (from the past 720 hour(s))  Respiratory Panel by RT PCR (Flu A&B, Covid) - Nasopharyngeal Swab     Status: None   Collection Time: 07/09/19 11:42 AM   Specimen: Nasopharyngeal Swab  Result Value Ref Range Status   SARS Coronavirus 2 by RT PCR NEGATIVE NEGATIVE Final    Comment: (NOTE) SARS-CoV-2 target nucleic acids are NOT DETECTED. The SARS-CoV-2 RNA is generally detectable in upper respiratoy specimens  during the acute phase of infection. The lowest concentration of SARS-CoV-2 viral copies this assay can detect is 131 copies/mL. A negative result does not preclude SARS-Cov-2 infection and should not be used as the sole basis for treatment or other patient management decisions. A negative result may occur with  improper specimen collection/handling, submission of specimen other than nasopharyngeal swab, presence of viral mutation(s) within the areas targeted by this assay, and inadequate number of viral copies (<131 copies/mL). A negative result must be combined with clinical observations, patient history, and epidemiological information. The expected result is Negative. Fact Sheet for Patients:  PinkCheek.be Fact Sheet for Healthcare Providers:  GravelBags.it This test is not yet ap proved or cleared by the [place] FDA and  has been authorized for detection and/or diagnosis of SARS-CoV-2 by FDA under an Emergency Use Authorization (EUA). This EUA will remain  in effect (meaning this test can be used) for the duration of the COVID-19 declaration under Section 564(b)(1) of the Act, 21 U.S.C. section 360bbb-3(b)(1), unless the authorization is terminated or revoked sooner.    Influenza A by PCR NEGATIVE NEGATIVE Final   Influenza B by PCR NEGATIVE NEGATIVE Final    Comment: (NOTE) The Xpert Xpress SARS-CoV-2/FLU/RSV assay is intended as an aid in  the diagnosis of influenza from Nasopharyngeal swab specimens and  should not be used as a sole basis for treatment. Nasal washings and  aspirates are unacceptable for Xpert Xpress SARS-CoV-2/FLU/RSV  testing. Fact Sheet for Patients: PinkCheek.be Fact Sheet for Healthcare Providers: GravelBags.it This test is not yet approved or cleared by the [place] FDA and  has been authorized for detection and/or diagnosis of  SARS-CoV-2 by  FDA under an Emergency Use Authorization (EUA). This EUA will remain  in effect (meaning this test can be used) for the duration of the  Covid-19 declaration under Section 564(b)(1) of the Act, 21  U.S.C. section 360bbb-3(b)(1), unless the authorization is  terminated or revoked. Performed at [facility], [address] [place], [place] [address]   MRSA PCR Screening     Status: None   Collection Time: 07/09/19  3:29 PM   Specimen: Nasopharyngeal  Result Value  Ref Range Status   MRSA by PCR NEGATIVE NEGATIVE Final    Comment:        The GeneXpert MRSA Assay (FDA approved for NASAL specimens only), is one component of a comprehensive MRSA colonization surveillance program. It is not intended to diagnose MRSA infection nor to guide or monitor treatment for MRSA infections. Performed at [facility], [address] [place], [place] [address]   CULTURE, BLOOD (ROUTINE X 2) w Reflex to ID Panel     Status: None (Preliminary result)   Collection Time: 07/09/19  7:00 PM   Specimen: BLOOD  Result Value Ref Range Status   Specimen Description BLOOD LEFT HAND  Final   Special Requests   Final    BOTTLES DRAWN AEROBIC AND ANAEROBIC Blood Culture adequate volume   Culture   Final    NO GROWTH 3 DAYS Performed at [facility], [address] [place], [place] [address]    Report Status PENDING  Incomplete  CULTURE, BLOOD (ROUTINE X 2) w Reflex to ID Panel     Status: None (Preliminary result)   Collection Time: 07/09/19  7:15 PM   Specimen: BLOOD  Result Value Ref Range Status   Specimen Description BLOOD LEFT ANTECUBITAL  Final   Special Requests   Final    BOTTLES DRAWN AEROBIC AND ANAEROBIC Blood Culture adequate volume   Culture   Final    NO GROWTH 3 DAYS Performed at [facility], [address] [place], [place] [address]    Report Status PENDING  Incomplete     Coagulation Studies: No results for input(s):  LABPROT, INR in the last 72 hours.  Urinalysis: No results for input(s): COLORURINE, LABSPEC, PHURINE, GLUCOSEU, HGBUR, BILIRUBINUR, KETONESUR, PROTEINUR, UROBILINOGEN, NITRITE, LEUKOCYTESUR in the last 72 hours.  Invalid input(s): APPERANCEUR      Imaging:  No results found.   Assessment & Plan: Pt is a 86 y.o. caucasian  male with CAD (CABG 3v in [address], mild aortic stenosis, HTN, HLD, Carotid artery stenosis , Atrial Fibrillation was admitted on 07/09/2019 with NSTEMI.   1. AKI Acute kidney injury on chronic kidney disease stage IIIb.  AKI is likely secondary to prerenal state progressing to ATN in setting of hypotension, taking ARB valsartan and diuretic. Agree with holding ARB and diuretic.  - U/A - Renal U/S - Hold Valsartan due to low BP and fluctuating Creatinine Avoid hypotension.  Avoid nephrotoxins including aminoglycosides, nonsteroidals. May need cardiac catheterization which will require IV contrast exposure.  Discussed risks and benefits with the patient  2. CKD st 3 B Baseline Cr 1.64/GFR 37  CKD is likely secondary to atherosclerosis Continue to monitor        LOS: 3 Taisei Bonnette 2/9/20213:06 PM    Note: This note was prepared with Dragon dictation. Any transcription errors are unintentional

## 2019-07-12 NOTE — Progress Notes (Signed)
Patient HR fluctuating 55-65. NP Ouma notified. Per NP, decrease dose to 15 mg/hr. Rate changed per NP order.  Update: Patient HR continues to be 55-65. NP Ouma made aware. Per NP, discontinue amiodarone infusion. Amiodarone drip stopped. Will continue to monitor.   Austin [name]

## 2019-07-13 LAB — CBC
HCT: 32.2 % — ABNORMAL LOW (ref 39.0–52.0)
Hemoglobin: 11.1 g/dL — ABNORMAL LOW (ref 13.0–17.0)
MCH: 30.6 pg (ref 26.0–34.0)
MCHC: 34.5 g/dL (ref 30.0–36.0)
MCV: 88.7 fL (ref 80.0–100.0)
Platelets: 202 10*3/uL (ref 150–400)
RBC: 3.63 MIL/uL — ABNORMAL LOW (ref 4.22–5.81)
RDW: 12.9 % (ref 11.5–15.5)
WBC: 5.8 10*3/uL (ref 4.0–10.5)
nRBC: 0 % (ref 0.0–0.2)

## 2019-07-13 LAB — URINALYSIS, COMPLETE (UACMP) WITH MICROSCOPIC
Bacteria, UA: NONE SEEN
Bilirubin Urine: NEGATIVE
Glucose, UA: NEGATIVE mg/dL
Hgb urine dipstick: NEGATIVE
Ketones, ur: NEGATIVE mg/dL
Leukocytes,Ua: NEGATIVE
Nitrite: NEGATIVE
Protein, ur: NEGATIVE mg/dL
Specific Gravity, Urine: 1.01 (ref 1.005–1.030)
Squamous Epithelial / HPF: NONE SEEN (ref 0–5)
pH: 5 (ref 5.0–8.0)

## 2019-07-13 LAB — BASIC METABOLIC PANEL
Anion gap: 13 (ref 5–15)
BUN: 72 mg/dL — ABNORMAL HIGH (ref 8–23)
CO2: 20 mmol/L — ABNORMAL LOW (ref 22–32)
Calcium: 8.2 mg/dL — ABNORMAL LOW (ref 8.9–10.3)
Chloride: 96 mmol/L — ABNORMAL LOW (ref 98–111)
Creatinine, Ser: 3.33 mg/dL — ABNORMAL HIGH (ref 0.61–1.24)
GFR calc Af Amer: 18 mL/min — ABNORMAL LOW (ref >60)
GFR calc non Af Amer: 16 mL/min — ABNORMAL LOW (ref >60)
Glucose, Bld: 101 mg/dL — ABNORMAL HIGH (ref 70–99)
Potassium: 3.4 mmol/L — ABNORMAL LOW (ref 3.5–5.1)
Sodium: 129 mmol/L — ABNORMAL LOW (ref 135–145)

## 2019-07-13 LAB — MAGNESIUM: Magnesium: 2.2 mg/dL (ref 1.7–2.4)

## 2019-07-13 MED ORDER — POLYETHYLENE GLYCOL 3350 17 G PO PACK
17.0000 g | PACK | Freq: Every day | ORAL | Status: DC | PRN
  Administered 2019-07-13: 10:00:00 17 g via ORAL
  Filled 2019-07-13: qty 1

## 2019-07-13 MED ORDER — SENNOSIDES-DOCUSATE SODIUM 8.6-50 MG PO TABS
1.0000 | ORAL_TABLET | Freq: Two times a day (BID) | ORAL | Status: DC
  Administered 2019-07-13 – 2019-07-14 (×4): 1 via ORAL
  Filled 2019-07-13 (×5): qty 1

## 2019-07-13 MED ORDER — POTASSIUM CHLORIDE CRYS ER 20 MEQ PO TBCR
40.0000 meq | EXTENDED_RELEASE_TABLET | Freq: Once | ORAL | Status: AC
  Administered 2019-07-13: 40 meq via ORAL
  Filled 2019-07-13: qty 2

## 2019-07-13 NOTE — Progress Notes (Signed)
[place], [place] 07/13/19  Subjective:   Hospital day # 4 Overall doing fair Denies any acute complaints Serum creatinine is slightly higher than yesterday but clinically patient feels well    Renal: 02/09 0701 - 02/10 0700 In: 668.8 [P.O.:600; I.V.:68.8] Out: 700 [Urine:700] Lab Results  Component Value Date   CREATININE 3.33 (H) 07/13/2019   CREATININE 3.12 (H) 07/12/2019   CREATININE 3.01 (H) 07/11/2019     Objective:  Vital signs in last 24 hours:  Temp:  [98.1 F (36.7 C)-98.4 F (36.9 C)] 98.4 F (36.9 C) (02/10 0750) Pulse Rate:  [54-68] 62 (02/10 1019) Resp:  [17-18] 18 (02/10 0750) BP: (112-130)/(47-60) 113/56 (02/10 0750) SpO2:  [93 %-98 %] 95 % (02/10 0750) Weight:  [76 kg] 76 kg (02/10 0500)  Weight change: -0.68 kg Filed Weights   07/11/19 1831 07/12/19 0358 07/13/19 0500  Weight: 76.7 kg 74.1 kg 76 kg    Intake/Output:    Intake/Output Summary (Last 24 hours) at 07/13/2019 1152 Last data filed at 07/12/2019 1900 Gross per 24 hour  Intake 240 ml  Output 500 ml  Net -260 ml     Physical Exam: General:  No acute distress, sitting up in the bed  HEENT  anicteric, moist oral mucous membrane  Pulm/lungs  normal breathing effort, clear to auscultation  CVS/Heart  irregular rhythm, no rub  Abdomen:   Soft, nontender  Extremities:  No peripheral edema  Neurologic:  Alert, oriented  Skin:  Warm, dry    Basic Metabolic Panel:  Recent Labs  Lab 07/10/19 0512 07/10/19 0512 07/10/19 0953 07/10/19 0953 07/11/19 0546 07/12/19 0249 07/13/19 0627  NA 136  --  135  --  129* 130* 129*  K 4.0  --  4.1  --  3.4* 3.6 3.4*  CL 102  --  101  --  94* 96* 96*  CO2 22  --  21*  --  21* 21* 20*  GLUCOSE 118*  --  101*  --  101* 114* 101*  BUN 45*  --  48*  --  66* 75* 72*  CREATININE 2.39*  --  2.51*  --  3.01* 3.12* 3.33*  CALCIUM 8.3*   < > 8.4*   < > 8.0* 7.7* 8.2*  MG  --   --   --   --  2.2 2.0 2.2  PHOS  --   --    --   --  5.1*  --   --    < > = values in this interval not displayed.     CBC: Recent Labs  Lab 07/09/19 1057 07/10/19 0512 07/11/19 0546 07/12/19 0249 07/13/19 0627  WBC 10.0 9.2 7.6 7.1 5.8  HGB 12.5* 11.3* 11.6* 11.4* 11.1*  HCT 37.7* 34.3* 34.2* 33.2* 32.2*  MCV 92.9 92.5 91.4 90.0 88.7  PLT 164 173 187 199 202     No results found for: HEPBSAG, HEPBSAB, HEPBIGM    Microbiology:  Recent Results (from the past 240 hour(s))  Respiratory Panel by RT PCR (Flu A&B, Covid) - Nasopharyngeal Swab     Status: None   Collection Time: 07/09/19 11:42 AM   Specimen: Nasopharyngeal Swab  Result Value Ref Range Status   SARS Coronavirus 2 by RT PCR NEGATIVE NEGATIVE Final    Comment: (NOTE) SARS-CoV-2 target nucleic acids are NOT DETECTED. The SARS-CoV-2 RNA is generally detectable in upper respiratoy specimens during the acute phase of infection. The lowest concentration of SARS-CoV-2 viral copies this  assay can detect is 131 copies/mL. A negative result does not preclude SARS-Cov-2 infection and should not be used as the sole basis for treatment or other patient management decisions. A negative result may occur with  improper specimen collection/handling, submission of specimen other than nasopharyngeal swab, presence of viral mutation(s) within the areas targeted by this assay, and inadequate number of viral copies (<131 copies/mL). A negative result must be combined with clinical observations, patient history, and epidemiological information. The expected result is Negative. Fact Sheet for Patients:  PinkCheek.be Fact Sheet for Healthcare Providers:  GravelBags.it This test is not yet ap proved or cleared by the [place] FDA and  has been authorized for detection and/or diagnosis of SARS-CoV-2 by FDA under an Emergency Use Authorization (EUA). This EUA will remain  in effect (meaning this test can be used)  for the duration of the COVID-19 declaration under Section 564(b)(1) of the Act, 21 U.S.C. section 360bbb-3(b)(1), unless the authorization is terminated or revoked sooner.    Influenza A by PCR NEGATIVE NEGATIVE Final   Influenza B by PCR NEGATIVE NEGATIVE Final    Comment: (NOTE) The Xpert Xpress SARS-CoV-2/FLU/RSV assay is intended as an aid in  the diagnosis of influenza from Nasopharyngeal swab specimens and  should not be used as a sole basis for treatment. Nasal washings and  aspirates are unacceptable for Xpert Xpress SARS-CoV-2/FLU/RSV  testing. Fact Sheet for Patients: PinkCheek.be Fact Sheet for Healthcare Providers: GravelBags.it This test is not yet approved or cleared by the [place] FDA and  has been authorized for detection and/or diagnosis of SARS-CoV-2 by  FDA under an Emergency Use Authorization (EUA). This EUA will remain  in effect (meaning this test can be used) for the duration of the  Covid-19 declaration under Section 564(b)(1) of the Act, 21  U.S.C. section 360bbb-3(b)(1), unless the authorization is  terminated or revoked. Performed at [facility], [address] [place], [place] [address]   MRSA PCR Screening     Status: None   Collection Time: 07/09/19  3:29 PM   Specimen: Nasopharyngeal  Result Value Ref Range Status   MRSA by PCR NEGATIVE NEGATIVE Final    Comment:        The GeneXpert MRSA Assay (FDA approved for NASAL specimens only), is one component of a comprehensive MRSA colonization surveillance program. It is not intended to diagnose MRSA infection nor to guide or monitor treatment for MRSA infections. Performed at [facility], [address] [place], [place] [address]   CULTURE, BLOOD (ROUTINE X 2) w Reflex to ID Panel     Status: None (Preliminary result)   Collection Time: 07/09/19  7:00 PM   Specimen: BLOOD  Result Value Ref Range Status    Specimen Description BLOOD LEFT HAND  Final   Special Requests   Final    BOTTLES DRAWN AEROBIC AND ANAEROBIC Blood Culture adequate volume   Culture   Final    NO GROWTH 4 DAYS Performed at [facility], [address], [place], [place] [address]    Report Status PENDING  Incomplete  CULTURE, BLOOD (ROUTINE X 2) w Reflex to ID Panel     Status: None (Preliminary result)   Collection Time: 07/09/19  7:15 PM   Specimen: BLOOD  Result Value Ref Range Status   Specimen Description BLOOD LEFT ANTECUBITAL  Final   Special Requests   Final    BOTTLES DRAWN AEROBIC AND ANAEROBIC Blood Culture adequate volume   Culture  Final    NO GROWTH 4 DAYS Performed at [facility], [address] [place], [place] [address]    Report Status PENDING  Incomplete    Coagulation Studies: No results for input(s): LABPROT, INR in the last 72 hours.  Urinalysis: No results for input(s): COLORURINE, LABSPEC, PHURINE, GLUCOSEU, HGBUR, BILIRUBINUR, KETONESUR, PROTEINUR, UROBILINOGEN, NITRITE, LEUKOCYTESUR in the last 72 hours.  Invalid input(s): APPERANCEUR    Imaging: US RENAL  Result Date: 07/12/2019 CLINICAL DATA:  Acute renal failure. EXAM: RENAL / URINARY TRACT ULTRASOUND COMPLETE COMPARISON:  None. FINDINGS: Right Kidney: Renal measurements: 9.4 x 4.6 x 4.1 cm = volume: 92 mL. Diffuse cortical thinning with prominent renal sinus fat. Echogenicity within normal limits. No mass or hydronephrosis visualized. Left Kidney: Renal measurements: 10.4 x 5.6 x 5.5 cm = volume: 166 mL. Diffuse cortical thinning with prominent renal sinus fat. 3.1 cm upper pole cyst. Echogenicity within normal limits. No mass or hydronephrosis visualized. Bladder: Appears normal for degree of bladder distention. Other: None. IMPRESSION: Moderate bilateral renal cortical atrophy.  No hydronephrosis. Electronically Signed   By: Claudie Revering M.D.   On: 07/12/2019 16:49     Medications:    . amiodarone  200 mg  Oral Daily  . apixaban  2.5 mg Oral BID  . aspirin EC  81 mg Oral Daily  . atorvastatin  80 mg Oral q1800  . chlorhexidine  15 mL Mouth Rinse BID  . Chlorhexidine Gluconate Cloth  6 each Topical Daily  . dorzolamide-timolol  1 drop Both Eyes BID  . latanoprost  1 drop Both Eyes QHS  . mouth rinse  15 mL Mouth Rinse q12n4p  . multivitamin-lutein  1 capsule Oral BID  . omega-3 acid ethyl esters  1 g Oral BID  . senna-docusate  1 tablet Oral BID  . tamsulosin  0.4 mg Oral Daily   acetaminophen, Melatonin, nitroGLYCERIN, ondansetron (ZOFRAN) IV, polyethylene glycol, polyvinyl alcohol  Assessment/ Plan:  86 y.o. male withwith CAD (CABG 3v in 20070, mild aortic stenosis, HTN, HLD, Carotid artery stenosis, atrial fibrillation was admitted on 07/09/2019 for NSTEMI (non-ST elevated myocardial infarction) ([facility]) [I21.4]  1. AKI Acute kidney injury on chronic kidney disease stage IIIb.  AKI is likely secondary to prerenal state progressing to ATN in setting of hypotension, taking ARB valsartan and diuretic. Agree with holding ARB and diuretic.  - U/A= pending - Renal U/S= shows cortical thinning bilaterally - Hold Valsartan due to low BP and fluctuating Creatinine Avoid hypotension.  Avoid nephrotoxins including aminoglycosides, nonsteroidals. May need cardiac catheterization in future which will require IV contrast exposure.  Discussed risks and benefits with the patient  2. CKD st 3 B Baseline Cr 1.64/GFR 37  CKD is likely secondary to atherosclerosis Continue to monitor     LOS: [place] 2/10/202111:52 AM  [place], [place]  Note: This note was prepared with Dragon dictation. Any transcription errors are unintentional

## 2019-07-13 NOTE — Plan of Care (Signed)
  Problem: Clinical Measurements: Goal: Ability to maintain clinical measurements within normal limits will improve Outcome: Progressing Goal: Cardiovascular complication will be avoided Outcome: Progressing   Problem: Safety: Goal: Ability to remain free from injury will improve Outcome: Progressing   

## 2019-07-13 NOTE — Care Management Important Message (Signed)
Important Message  Patient Details  Name: Austin [name] MRN: [id number] Date of Birth: 12/30/1928   Medicare Important Message Given:  Yes     Dannette Barbara 07/13/2019, 11:43 AM

## 2019-07-13 NOTE — Progress Notes (Signed)
Subjective: Interval History: The patient is feeling well today, he took a sponge bath and shaved this morning. He would like to return home soon. He reports he drank a quart of water yesterday and will drink at least that amount today. He feels he is making a normal amount of urine. Denies shortness of breath or chest pain. Urine output: 768mL yesterday Serum creatinine: 3.12 yesterday --> 3.33 today  Objective: Vital signs in last 24 hours: Temp:  [97.7 F (36.5 C)-98.4 F (36.9 C)] 97.7 F (36.5 C) (02/10 1219) Pulse Rate:  [54-68] 59 (02/10 1219) Resp:  [17-18] 18 (02/10 1219) BP: (112-130)/(47-60) 116/54 (02/10 1219) SpO2:  [93 %-98 %] 94 % (02/10 1219) Weight:  [76 kg] 76 kg (02/10 0500) Weight change: -0.68 kg  Intake/Output from previous day: 02/09 0701 - 02/10 0700 In: 668.8 [P.O.:600; I.V.:68.8] Out: 700 [Urine:700] Intake/Output this shift: Total I/O In: 0  Out: 150 [Urine:150]  Physical Exam:  General: Patient is alert, awake, in no acute distress. Was out of bed bathing with assistance. Wife at bedside. HEENT: Head: normocephalic, atraumatic. Throat: Moist mucous membranes. Respiratory: Respiratory effort normal. Lungs clear to auscultation bilaterally. Cardiac: Irregularly irregular rhythm without murmur, rub, or gallop. Radial pulses 2+ bilaterally. Extremities: No peripheral edema Abdomen: Soft, non-tender Skin: Warm, dry, normal turgor Neuro: Alert and oriented to person, place, time and situation Psych: Normal mood and affect    Current Facility-Administered Medications (Cardiovascular):  .  amiodarone (PACERONE) tablet 200 mg .  atorvastatin (LIPITOR) tablet 80 mg .  nitroGLYCERIN (NITROSTAT) SL tablet 0.4 mg .  omega-3 acid ethyl esters (LOVAZA) capsule 1 g     Current Facility-Administered Medications (Analgesics):  .  acetaminophen (TYLENOL) tablet 650 mg .  aspirin EC tablet 81 mg   Current Facility-Administered Medications (Hematological):   .  apixaban (ELIQUIS) tablet 2.5 mg   Current Facility-Administered Medications (Other):  .  chlorhexidine (PERIDEX) 0.12 % solution 15 mL .  Chlorhexidine Gluconate Cloth 2 % PADS 6 each .  dorzolamide-timolol (COSOPT) 22.3-6.8 MG/ML ophthalmic solution 1 drop .  latanoprost (XALATAN) 0.005 % ophthalmic solution 1 drop .  MEDLINE mouth rinse .  Melatonin TABS 5 mg .  multivitamin-lutein (OCUVITE-LUTEIN) capsule 1 capsule .  ondansetron (ZOFRAN) injection 4 mg .  polyethylene glycol (MIRALAX / GLYCOLAX) packet 17 g .  polyvinyl alcohol (LIQUIFILM TEARS) 1.4 % ophthalmic solution .  senna-docusate (Senokot-S) tablet 1 tablet .  tamsulosin (FLOMAX) capsule 0.4 mg  No current outpatient medications on file.   Lab Results: Recent Labs    07/12/19 0249 07/13/19 0627  WBC 7.1 5.8  HGB 11.4* 11.1*  HCT 33.2* 32.2*  PLT 199 202   BMET:  Recent Labs    07/12/19 0249 07/13/19 0627  NA 130* 129*  K 3.6 3.4*  CL 96* 96*  CO2 21* 20*  GLUCOSE 114* 101*  BUN 75* 72*  CREATININE 3.12* 3.33*  CALCIUM 7.7* 8.2*   No results for input(s): PTH in the last 72 hours. Iron Studies: No results for input(s): IRON, TIBC, TRANSFERRIN, FERRITIN in the last 72 hours.  Studies/Results: US RENAL  Result Date: 07/12/2019 CLINICAL DATA:  Acute renal failure. EXAM: RENAL / URINARY TRACT ULTRASOUND COMPLETE COMPARISON:  None. FINDINGS: Right Kidney: Renal measurements: 9.4 x 4.6 x 4.1 cm = volume: 92 mL. Diffuse cortical thinning with prominent renal sinus fat. Echogenicity within normal limits. No mass or hydronephrosis visualized. Left Kidney: Renal measurements: 10.4 x 5.6 x 5.5 cm = volume: 166  mL. Diffuse cortical thinning with prominent renal sinus fat. 3.1 cm upper pole cyst. Echogenicity within normal limits. No mass or hydronephrosis visualized. Bladder: Appears normal for degree of bladder distention. Other: None. IMPRESSION: Moderate bilateral renal cortical atrophy.  No hydronephrosis.  Electronically Signed   By: Claudie Revering M.D.   On: 07/12/2019 16:49    I have reviewed the patient's current medications.  Assessment/Plan: Pt is a 86 y.o.   male with PMHx of CAD s/p CABG x3 with unstable angina, hypertension, hyperlipidemia, atrial fibrillation, carotid artery stenosis, mild aortic stenosis, peripheral vascular disease, CKD stage IIB was admitted on 07/09/2019 with NSTEMI. Hospital course was complicated by worsening chest pain and hypoxemia soon after admission that required ICU monitoring.  Nephrology is consulted for AKI.  Lasix vs renal hypoperfusion is thought to be a contributing factor and the diuretic has now been discontinued after administration of 4 doses on 07/10/19 and 1 dose on 07/11/19.  #Acute kidney injury on chronic kidney disease stage IIIB - Creatinine 3.12-->3.33 today. Baseline creatinine ~1.6/GFR 37 on 11/23/2018. Continue to monitor creatinine level - Urinalysis ordered, awaiting results.  - Renal US reveals moderate bilateral renal cortical atrophy, no hydronephrosis. This is consistent with diagnosis of CKD. - Encourage PO fluids. Drank about 1 quart of water yesterday - Agree with holding Lasix, valsartan, any nephrotoxic medications due to AKI on CKD stage IIIB.   LOS: 4 days   Maryln Gottron 07/13/2019,2:32 PM

## 2019-07-13 NOTE — Progress Notes (Addendum)
Patient Name: Austin [name] Date of Encounter: 07/13/2019  Hospital Problem List     Principal Problem:   Coronary artery disease involving native coronary artery of native heart with unstable angina pectoris [facility]) Active Problems:   Benign essential HTN   Acid reflux   Glaucoma   Borderline diabetes     Atrial fibrillation [facility])    Patient Profile     86 y.o.malewith history ofCAD s/p CABG x 3 in 2007 with LIMA to LAD, SVG to OM1, and PDA (cath from 2016 with 3/3 functional grafts), mild aortic stenosis, HTN, HLD, and carotid artery stenosis (40-59% to bilateral ICAs), followed by Dr. Gabriel Carina presents emergency room today with chest pain that started approximately 3 AM on day of admission. Marland Kitchen He reported nausea, diaphoresis and shortness of breath. Pain was 7 on 10 on presentation which improved with sublingual nitroglycerin. Stated his pain was similar to his angina. EKG in the emergency room revealed ST depression in the lateral leads. Initial troponin was 41 with a subsequent value of 51. He was started on heparin. BNP was 304. Creatinine was 2.07 with a baseline of approximately 1.5.Echo showed near normal LV function EF 50 to 55% with no obvious regional wall motion abnormality. Sclerotic to minimally stenotic aortic valve with no significant AI MR or TR. Respiratory status  improving. Mild troponin bump. BNP improved to. to 337. Renal function continues to decline. He is currently off of diuresis.    Subjective   No chest pain. Resting comfortably on 2 liters oxygen.   Inpatient Medications    . amiodarone  200 mg Oral Daily  . apixaban  2.5 mg Oral BID  . aspirin EC  81 mg Oral Daily  . atorvastatin  80 mg Oral q1800  . chlorhexidine  15 mL Mouth Rinse BID  . Chlorhexidine Gluconate Cloth  6 each Topical Daily  . dorzolamide-timolol  1 drop Both Eyes BID  . latanoprost  1 drop Both Eyes QHS  . mouth rinse  15 mL Mouth Rinse q12n4p  .  multivitamin-lutein  1 capsule Oral BID  . omega-3 acid ethyl esters  1 g Oral BID  . tamsulosin  0.4 mg Oral Daily    Vital Signs    Vitals:   07/12/19 1059 07/12/19 1954 07/13/19 0308 07/13/19 0500  BP: (!) 119/52 (!) 112/47 130/60   Pulse: 65 62 68   Resp: 18 18 17    Temp:  98.3 F (36.8 C) 98.1 F (36.7 C)   TempSrc:  Oral    SpO2: 95% 98% 93%   Weight:    76 kg  Height:        Intake/Output Summary (Last 24 hours) at 07/13/2019 0727 Last data filed at 07/12/2019 1900 Gross per 24 hour  Intake 668.78 ml  Output 700 ml  Net -31.22 ml   Filed Weights   07/11/19 1831 07/12/19 0358 07/13/19 0500  Weight: 76.7 kg 74.1 kg 76 kg    Physical Exam    GEN: Well nourished, well developed, in no acute distress.  HEENT: normal.  Neck: Supple, no JVD, carotid bruits, or masses. Cardiac: RRR, no murmurs, rubs, or gallops. No clubbing, cyanosis, edema.  Radials/DP/PT 2+ and equal bilaterally.  Respiratory:  Respirations regular and unlabored, clear to auscultation bilaterally. GI: Soft, nontender, nondistended, BS + x 4. MS: no deformity or atrophy. Skin: warm and dry, no rash. Neuro:  Strength and sensation are intact. Psych: Normal affect.  Labs    CBC  Recent Labs    07/12/19 0249 07/13/19 0627  WBC 7.1 5.8  HGB 11.4* 11.1*  HCT 33.2* 32.2*  MCV 90.0 88.7  PLT 199 785   Basic Metabolic Panel Recent Labs    07/11/19 0546 07/11/19 0546 07/12/19 0249 07/13/19 0627  NA 129*   < > 130* 129*  K 3.4*   < > 3.6 3.4*  CL 94*   < > 96* 96*  CO2 21*   < > 21* 20*  GLUCOSE 101*   < > 114* 101*  BUN 66*   < > 75* 72*  CREATININE 3.01*   < > 3.12* 3.33*  CALCIUM 8.0*   < > 7.7* 8.2*  MG 2.2   < > 2.0 2.2  PHOS 5.1*  --   --   --    < > = values in this interval not displayed.   Liver Function Tests No results for input(s): AST, ALT, ALKPHOS, BILITOT, PROT, ALBUMIN in the last 72 hours. No results for input(s): LIPASE, AMYLASE in the last 72 hours. Cardiac  Enzymes No results for input(s): CKTOTAL, CKMB, CKMBINDEX, TROPONINI in the last 72 hours. BNP Recent Labs    07/11/19 0546  BNP 337.0*   D-Dimer No results for input(s): DDIMER in the last 72 hours. Hemoglobin A1C No results for input(s): HGBA1C in the last 72 hours. Fasting Lipid Panel No results for input(s): CHOL, HDL, LDLCALC, TRIG, CHOLHDL, LDLDIRECT in the last 72 hours. Thyroid Function Tests No results for input(s): TSH, T4TOTAL, T3FREE, THYROIDAB in the last 72 hours.  Invalid input(s): FREET3  Telemetry    afib  ECG    afib with rvr  Radiology    US RENAL  Result Date: 07/12/2019 CLINICAL DATA:  Acute renal failure. EXAM: RENAL / URINARY TRACT ULTRASOUND COMPLETE COMPARISON:  None. FINDINGS: Right Kidney: Renal measurements: 9.4 x 4.6 x 4.1 cm = volume: 92 mL. Diffuse cortical thinning with prominent renal sinus fat. Echogenicity within normal limits. No mass or hydronephrosis visualized. Left Kidney: Renal measurements: 10.4 x 5.6 x 5.5 cm = volume: 166 mL. Diffuse cortical thinning with prominent renal sinus fat. 3.1 cm upper pole cyst. Echogenicity within normal limits. No mass or hydronephrosis visualized. Bladder: Appears normal for degree of bladder distention. Other: None. IMPRESSION: Moderate bilateral renal cortical atrophy.  No hydronephrosis. Electronically Signed   By: Claudie Revering M.D.   On: 07/12/2019 16:49   DG Chest Port 1 View  Result Date: 07/10/2019 CLINICAL DATA:  Chest pain and shortness of breath EXAM: PORTABLE CHEST 1 VIEW COMPARISON:  07/09/2019 FINDINGS: Cardiomegaly and CABG changes again noted. Bilateral interstitial and airspace opacities, RIGHT greater than LEFT, are slightly improved. There may be trace bilateral pleural effusions present. No pneumothorax. IMPRESSION: Slightly improved bilateral interstitial and airspace opacities, RIGHT greater than LEFT. Electronically Signed   By: Margarette Canada M.D.   On: 07/10/2019 10:58   DG Chest Port 1  View  Result Date: 07/09/2019 CLINICAL DATA:  Chest pain EXAM: PORTABLE CHEST 1 VIEW COMPARISON:  07/09/2019 FINDINGS: Bilateral airspace disease again noted, worsening on the right since prior study with slight improvement on the left. Cardiomegaly. Suspect small right pleural effusion. No acute bony abnormality. IMPRESSION: Bilateral airspace disease, improving on the left and worsening on the right. This could reflect asymmetric edema or infection. Small right effusion. Electronically Signed   By: Rolm Baptise M.D.   On: 07/09/2019 17:15   DG Chest Port 1 View  Result Date: 07/09/2019 CLINICAL  DATA:  Chest pain. EXAM: PORTABLE CHEST 1 VIEW COMPARISON:  August 23, 2010. FINDINGS: Stable cardiomegaly. Status post coronary bypass graft. Interstitial densities are noted throughout both lungs most consistent with pulmonary edema. No pneumothorax or pleural effusion is noted. Bony thorax is unremarkable. IMPRESSION: Findings consistent with bilateral pulmonary edema. Electronically Signed   By: Marijo Conception M.D.   On: 07/09/2019 11:35   ECHOCARDIOGRAM COMPLETE  Result Date: 07/11/2019   ECHOCARDIOGRAM REPORT   Patient Name:   Austin [name] Date of Exam: 07/10/2019 Medical Rec #:  [id number]        Height:       66.0 in Accession #:    [id number]       Weight:       172.8 lb Date of Birth:  14-Nov-1928         BSA:          1.88 m Patient Age:    66 years         BP:           140/60 mmHg Patient Gender: M                HR:           66 bpm. Exam Location:  [facility] Procedure: 2D Echo and Intracardiac Opacification Agent Indications:     NSTEMI I21.4  History:         Patient has no prior history of Echocardiogram examinations.  Sonographer:     Arville Go RDCS Referring Phys:  [place] Diagnosing Phys: Bartholome Bill MD  Sonographer Comments: Technically difficult study due to poor echo windows. IMPRESSIONS  1. Left ventricular ejection fraction, by visual estimation, is 50 to 55%. The left ventricle  has low normal function. Left ventricular septal wall thickness was mildly increased. Mildly increased left ventricular posterior wall thickness. There is mildly increased left ventricular hypertrophy.  2. Definity contrast agent was given IV to delineate the left ventricular endocardial borders.  3. Left ventricular diastolic parameters are consistent with Grade I diastolic dysfunction (impaired relaxation).  4. The left ventricle has no regional wall motion abnormalities.  5. Global right ventricle has normal systolic function.The right ventricular size is normal. No increase in right ventricular wall thickness.  6. Left atrial size was mildly dilated.  7. Right atrial size was normal.  8. The mitral valve is degenerative. Mild mitral valve regurgitation.  9. The tricuspid valve is not well visualized. 10. The tricuspid valve is not well visualized. Tricuspid valve regurgitation is trivial. 11. The aortic valve is abnormal. Aortic valve regurgitation is trivial. Mild to moderate aortic valve sclerosis/calcification without any evidence of aortic stenosis. 12. The pulmonic valve was not well visualized. Pulmonic valve regurgitation is trivial. 13. The atrial septum is grossly normal. FINDINGS  Left Ventricle: Left ventricular ejection fraction, by visual estimation, is 50 to 55%. The left ventricle has low normal function. Definity contrast agent was given IV to delineate the left ventricular endocardial borders. The left ventricle has no regional wall motion abnormalities. Mildly increased left ventricular posterior wall thickness. There is mildly increased left ventricular hypertrophy. Left ventricular diastolic parameters are consistent with Grade I diastolic dysfunction (impaired relaxation). Right Ventricle: The right ventricular size is normal. No increase in right ventricular wall thickness. Global RV systolic function is has normal systolic function. Left Atrium: Left atrial size was mildly dilated. Right  Atrium: Right atrial size was normal in size Pericardium: There is no  evidence of pericardial effusion. Mitral Valve: The mitral valve is degenerative in appearance. Mild mitral valve regurgitation. Tricuspid Valve: The tricuspid valve is not well visualized. Tricuspid valve regurgitation is trivial. Aortic Valve: The aortic valve is abnormal. Aortic valve regurgitation is trivial. Mild to moderate aortic valve sclerosis/calcification is present, without any evidence of aortic stenosis. Aortic valve mean gradient measures 9.0 mmHg. Aortic valve peak gradient measures 17.1 mmHg. Aortic valve area, by VTI measures 1.73 cm. Pulmonic Valve: The pulmonic valve was not well visualized. Pulmonic valve regurgitation is trivial. Pulmonic regurgitation is trivial. Aorta: The aortic root is normal in size and structure. IAS/Shunts: The atrial septum is grossly normal.  LEFT VENTRICLE PLAX 2D LVIDd:         5.46 cm       Diastology LVIDs:         4.32 cm       LV e' lateral:   9.79 cm/s LV PW:         1.08 cm       LV E/e' lateral: 13.0 LV IVS:        1.43 cm LVOT diam:     2.10 cm LV SV:         61 ml LV SV Index:   31.60 LVOT Area:     3.46 cm  LV Volumes (MOD) LV area d, A2C:    38.40 cm LV area d, A4C:    28.20 cm LV area s, A2C:    27.50 cm LV area s, A4C:    17.70 cm LV major d, A2C:   7.94 cm LV major d, A4C:   6.99 cm LV major s, A2C:   7.12 cm LV major s, A4C:   6.66 cm LV vol d, MOD A2C: 150.0 ml LV vol d, MOD A4C: 92.8 ml LV vol s, MOD A2C: 85.6 ml LV vol s, MOD A4C: 38.3 ml LV SV MOD A2C:     64.4 ml LV SV MOD A4C:     92.8 ml LV SV MOD BP:      66.5 ml RIGHT VENTRICLE RV Basal diam:  2.63 cm RV S prime:     8.27 cm/s TAPSE (M-mode): 1.3 cm LEFT ATRIUM             Index       RIGHT ATRIUM           Index LA diam:        4.80 cm 2.55 cm/m  RA Area:     16.20 cm LA Vol (A2C):   73.7 ml 39.21 ml/m RA Volume:   40.10 ml  21.34 ml/m LA Vol (A4C):   40.9 ml 21.76 ml/m LA Biplane Vol: 57.6 ml 30.65 ml/m  AORTIC  VALVE                    PULMONIC VALVE AV Area (Vmax):    1.56 cm     PV Vmax:       1.09 m/s AV Area (Vmean):   1.61 cm     PV Peak grad:  4.8 mmHg AV Area (VTI):     1.73 cm AV Vmax:           207.00 cm/s AV Vmean:          137.000 cm/s AV VTI:            0.374 m AV Peak Grad:      17.1 mmHg AV Mean Grad:  9.0 mmHg LVOT Vmax:         93.20 cm/s LVOT Vmean:        63.500 cm/s LVOT VTI:          0.187 m LVOT/AV VTI ratio: 0.50  AORTA Ao Root diam: 2.60 cm Ao Asc diam:  3.50 cm MITRAL VALVE MV Area (PHT): 3.60 cm             SHUNTS MV PHT:        61.19 msec           Systemic VTI:  0.19 m MV Decel Time: 211 msec             Systemic Diam: 2.10 cm MV E velocity: 127.00 cm/s 103 cm/s  Bartholome Bill MD Electronically signed by Bartholome Bill MD Signature Date/Time: 07/11/2019/7:24:55 AM    Final     Assessment & Plan       86 y.o.malewith history ofCAD s/p CABG x 3 in 2007 with LIMA to LAD, SVG to OM1, and PDA (cath from 2016 with 3/3 functional grafts), mild aortic stenosis, HTN, HLD, and carotid artery stenosis (40-59% to bilateral ICAs), followed by Dr. Juanetta Gosling presented to the ER with chest pain starting at 3 AM this morning. EKG showed ST depression in the lateral leads which was somewhat more normal than his baseline.Currently shows atrial fibrillation withslow ventricular responseinitial troponin was 41 followed by 51with peaked at 240..  1. Coronary artery disease  Status post coronary artery bypass grafting x3 in 2007 with a LIMA to the LAD, vein graft to OM1 and PDA. Cardiac cath in 2016 revealed 3 functional grasp with mild aortic stenosis. Presented with chest tightness shortness of breath and hypoxia. Had a mild troponin elevation peaking at 240. Has no chest pain at present. No ischemic changes on electrocardiogram.  Would continue with heparin IV, control rate with metoprolol tartrate 12.5 mg twice daily for now. . We will continue with IV nitroglycerin  to titrate pressure and for afterload reduction. Continue with atorvastatin and aspirin. Likely not an acute coronary event but demand ischemia with afib with rvr and with limited coronary reserveand at least mild aortic stenosis.Not a candidate for  invasive evaluation for now due to renal function. Will continue to  treat with asa and will need to start eliquis at 2.5 mg bid due to renal function and age.  We will continue with atorvastatin. Hemodynamically stable. Off of beta blocker for now due to bradycardia.   2. Respiratory failure  Oxygenation improved.BNPimproved somewhat to 337 down from 537.   3. Acute on chronic renal insufficiency  Renal function decreased further.Diuresing fairly welland is -3.2 L since admission. Will need to follow renal function.Creatinine 3.33 this morning with a GFR of 16 down from 22 onadmission. Likelyacute on chronic kidney injurydue to hypoxia and hemodynamics with events of yesterday. We will continue to follow and adjust renally cleared drugsas needed. Appreciate Dr. Candiss Norse input. Not candidate for contrast studies at present.   4. Atrial fibrillation  Apparently new. May have begun prior to admission and loss of atrial kick and rapid ventricular response likely exacerbated his clinical decline.Had started iv amiodarone for load however, remains in afib with slow vr. Was taken off of the amiodarone drip. Will treat with  amiodarone po at  200 mg daily following heart rate and hemodynamics.  Continue with Eliquis 2.5 bid.   5. Congestive heart failure  Echo revealed fairly preserved LV function EF around 50% globally with  mild LVH. A sclerotic tomildlystenotic aortic valve. No obvious wall motion abnormality. Off diuretics. Will follow renal function and  volume status.  Has diuresed nearly 4 liters and appoximately 3 kg since admission. EF about50% so we will continue  with metoprolol tartrate. Would not afterload reduced at present given preserved LV function and acute renal insufficiency for now.   Signed, Javier Docker Teighan Aubert MD 07/13/2019, 7:27 AM  Pager: [phone]

## 2019-07-13 NOTE — Progress Notes (Signed)
PROGRESS NOTE    Austin [name]  ULA:[id number] DOB: May 20, 1929 DOA: 07/09/2019 PCP: Jerrol Banana., MD   Brief Narrative: Austin [name] is a 86 y.o.Caucasian malewith medical problems of CAD, CABG, A Fib, who was admitted to [facility] on2/6/2021for evaluation of shortness of breath and chest pain. Hospital course complicated by hypoxia and worsening chest pain requiring BiPAP and ICU monitoring.  Pt was transferred to hospitalist service on 07/11/19.  2/10: Patient seen and examined.  Patient's wife at bedside.  Remains stable and appears comfortable on 2 L nasal cannula.  Creatinine slightly trending up to 3.33 today.  Nephrology and cardiology input appreciated    Assessment & Plan:   Principal Problem:   Coronary artery disease involving native coronary artery of native heart with unstable angina pectoris ([place]) Active Problems:   Benign essential HTN   Acid reflux   Glaucoma   Borderline diabetes   NSTEMI (non-ST elevated myocardial infarction) (HCC)   Atrial fibrillation (HCC)  Acute Hypoxic Respiratory Failure, improved -due to florid pulmonary edema secondary to acute on chronic diastolic CHF as well as renal insufficiency.  Weaned off BiPAP to 4L and transferred to hospitalist service on 07/11/19. -TTE during this admission showed relatively normal LVEF and right heart function. --Was receiving aggressive IV diuretic.  Lasix d/c'ed on 2/8 due to Cr trending up. PLAN: -continue Bronchodilator Therapy --Hold diuretic since Cr trending up --Strict I/O --wean O2  Non-ST elevation MI -History of severe CAD status post CABG At this time given reduced GFR there are no immediate plans for cardiac catheterization given the imminent risk of worsening renal failure with contrast administration We will continue goal-directed medical therapy as per cardiology  Renal Failure-acute on chronic stage IIIb --Cr 2.07 on presentation, Baseline Cr 1.64/GFR 37.    trending up  to 3's. Likely due to hypoperfusion from Afib and then aggressive diuretic. --Hold diuretic for now --Hold ARB --Nephrology consult Continue to avoid nephrotoxins  Atrial fibrillation Per cards, "Apparently new. May have begun prior to admission and loss of atrial kick may have exacerbated his clinical decline." --Cardiology following --s/p amio load, now p.o. --heparin gtt transitioned to Eliquis   DVT prophylaxis: Eliquis Code Status: Full Family Communication: Wife at bedside Disposition Plan: Home, anticipate 24 to 48 hours once kidney function start to improve and confirmation of no immediate plans for ischemic evaluation per cardiology  Consultants:   Nephrology- [facility]  Cardiology-[facility]  Procedures:   None  Antimicrobials:   None   Subjective: Patient seen and examined No acute overnight events No new complaints Appears comfortable on 2 L nasal cannula  Objective: Vitals:   07/13/19 0500 07/13/19 0750 07/13/19 1019 07/13/19 1219  BP:  (!) 113/56  (!) 116/54  Pulse:  (!) 54 62 (!) 59  Resp:  18  18  Temp:  98.4 F (36.9 C)  97.7 F (36.5 C)  TempSrc:  Oral  Oral  SpO2:  95%  94%  Weight: 76 kg     Height:        Intake/Output Summary (Last 24 hours) at 07/13/2019 1558 Last data filed at 07/13/2019 1412 Gross per 24 hour  Intake 0 ml  Output 450 ml  Net -450 ml   Filed Weights   07/11/19 1831 07/12/19 0358 07/13/19 0500  Weight: 76.7 kg 74.1 kg 76 kg    Examination:  Constitutional: NAD, AAOx3 HEENT: conjunctivae and lids normal, EOMI CV: irregularly irregular no M,R,G. Distal pulses +2.  No cyanosis.   RESP: No obvious crackles, normal respiratory effort, on 2L GI: +BS, NTND Extremities: No effusions, edema, or tenderness in BLE SKIN: warm, dry and intact Neuro: II - XII grossly intact.  Sensation intact Psych: Normal mood and affect.  Appropriate judgement and reason    Data Reviewed: I have personally  reviewed following labs and imaging studies  CBC: Recent Labs  Lab 07/09/19 1057 07/10/19 0512 07/11/19 0546 07/12/19 0249 07/13/19 0627  WBC 10.0 9.2 7.6 7.1 5.8  HGB 12.5* 11.3* 11.6* 11.4* 11.1*  HCT 37.7* 34.3* 34.2* 33.2* 32.2*  MCV 92.9 92.5 91.4 90.0 88.7  PLT 164 173 187 199 163   Basic Metabolic Panel: Recent Labs  Lab 07/10/19 0512 07/10/19 0953 07/11/19 0546 07/12/19 0249 07/13/19 0627  NA 136 135 129* 130* 129*  K 4.0 4.1 3.4* 3.6 3.4*  CL 102 101 94* 96* 96*  CO2 22 21* 21* 21* 20*  GLUCOSE 118* 101* 101* 114* 101*  BUN 45* 48* 66* 75* 72*  CREATININE 2.39* 2.51* 3.01* 3.12* 3.33*  CALCIUM 8.3* 8.4* 8.0* 7.7* 8.2*  MG  --   --  2.2 2.0 2.2  PHOS  --   --  5.1*  --   --    GFR: Estimated Creatinine Clearance: 13.8 mL/min (A) (by C-G formula based on SCr of 3.33 mg/dL (H)). Liver Function Tests: Recent Labs  Lab 07/09/19 1057  AST 22  ALT 24  ALKPHOS 82  BILITOT 1.9*  PROT 7.8  ALBUMIN 4.1   No results for input(s): LIPASE, AMYLASE in the last 168 hours. No results for input(s): AMMONIA in the last 168 hours. Coagulation Profile: Recent Labs  Lab 07/09/19 1113  INR 1.3*   Cardiac Enzymes: No results for input(s): CKTOTAL, CKMB, CKMBINDEX, TROPONINI in the last 168 hours. BNP (last 3 results) No results for input(s): PROBNP in the last 8760 hours. HbA1C: No results for input(s): HGBA1C in the last 72 hours. CBG: Recent Labs  Lab 07/09/19 1524  GLUCAP 141*   Lipid Profile: No results for input(s): CHOL, HDL, LDLCALC, TRIG, CHOLHDL, LDLDIRECT in the last 72 hours. Thyroid Function Tests: No results for input(s): TSH, T4TOTAL, FREET4, T3FREE, THYROIDAB in the last 72 hours. Anemia Panel: No results for input(s): VITAMINB12, FOLATE, FERRITIN, TIBC, IRON, RETICCTPCT in the last 72 hours. Sepsis Labs: Recent Labs  Lab 07/09/19 1900  PROCALCITON 0.18    Recent Results (from the past 240 hour(s))  Respiratory Panel by RT PCR (Flu  A&B, Covid) - Nasopharyngeal Swab     Status: None   Collection Time: 07/09/19 11:42 AM   Specimen: Nasopharyngeal Swab  Result Value Ref Range Status   SARS Coronavirus 2 by RT PCR NEGATIVE NEGATIVE Final    Comment: (NOTE) SARS-CoV-2 target nucleic acids are NOT DETECTED. The SARS-CoV-2 RNA is generally detectable in upper respiratoy specimens during the acute phase of infection. The lowest concentration of SARS-CoV-2 viral copies this assay can detect is 131 copies/mL. A negative result does not preclude SARS-Cov-2 infection and should not be used as the sole basis for treatment or other patient management decisions. A negative result may occur with  improper specimen collection/handling, submission of specimen other than nasopharyngeal swab, presence of viral mutation(s) within the areas targeted by this assay, and inadequate number of viral copies (<131 copies/mL). A negative result must be combined with clinical observations, patient history, and epidemiological information. The expected result is Negative. Fact Sheet for Patients:  PinkCheek.be Fact Sheet for Healthcare  Providers:  GravelBags.it This test is not yet ap proved or cleared by the [place] and  has been authorized for detection and/or diagnosis of SARS-CoV-2 by FDA under an Emergency Use Authorization (EUA). This EUA will remain  in effect (meaning this test can be used) for the duration of the COVID-19 declaration under Section 564(b)(1) of the Act, 21 U.S.C. section 360bbb-3(b)(1), unless the authorization is terminated or revoked sooner.    Influenza A by PCR NEGATIVE NEGATIVE Final   Influenza B by PCR NEGATIVE NEGATIVE Final    Comment: (NOTE) The Xpert Xpress SARS-CoV-2/FLU/RSV assay is intended as an aid in  the diagnosis of influenza from Nasopharyngeal swab specimens and  should not be used as a sole basis for treatment. Nasal washings  and  aspirates are unacceptable for Xpert Xpress SARS-CoV-2/FLU/RSV  testing. Fact Sheet for Patients: PinkCheek.be Fact Sheet for Healthcare Providers: GravelBags.it This test is not yet approved or cleared by the [place] FDA and  has been authorized for detection and/or diagnosis of SARS-CoV-2 by  FDA under an Emergency Use Authorization (EUA). This EUA will remain  in effect (meaning this test can be used) for the duration of the  Covid-19 declaration under Section 564(b)(1) of the Act, 21  U.S.C. section 360bbb-3(b)(1), unless the authorization is  terminated or revoked. Performed at [facility], [address] [place], [place] [address]   MRSA PCR Screening     Status: None   Collection Time: 07/09/19  3:29 PM   Specimen: Nasopharyngeal  Result Value Ref Range Status   MRSA by PCR NEGATIVE NEGATIVE Final    Comment:        The GeneXpert MRSA Assay (FDA approved for NASAL specimens only), is one component of a comprehensive MRSA colonization surveillance program. It is not intended to diagnose MRSA infection nor to guide or monitor treatment for MRSA infections. Performed at [facility], [address] [place], [place] [address]   CULTURE, BLOOD (ROUTINE X 2) w Reflex to ID Panel     Status: None (Preliminary result)   Collection Time: 07/09/19  7:00 PM   Specimen: BLOOD  Result Value Ref Range Status   Specimen Description BLOOD LEFT HAND  Final   Special Requests   Final    BOTTLES DRAWN AEROBIC AND ANAEROBIC Blood Culture adequate volume   Culture   Final    NO GROWTH 4 DAYS Performed at [facility] [address] [place], [place] [address]    Report Status PENDING  Incomplete  CULTURE, BLOOD (ROUTINE X 2) w Reflex to ID Panel     Status: None (Preliminary result)   Collection Time: 07/09/19  7:15 PM   Specimen: BLOOD  Result Value Ref Range Status   Specimen  Description BLOOD LEFT ANTECUBITAL  Final   Special Requests   Final    BOTTLES DRAWN AEROBIC AND ANAEROBIC Blood Culture adequate volume   Culture   Final    NO GROWTH 4 DAYS Performed at [facility], [address], [place], [place] [address]    Report Status PENDING  Incomplete         Radiology Studies: US RENAL  Result Date: 07/12/2019 CLINICAL DATA:  Acute renal failure. EXAM: RENAL / URINARY TRACT ULTRASOUND COMPLETE COMPARISON:  None. FINDINGS: Right Kidney: Renal measurements: 9.4 x 4.6 x 4.1 cm = volume: 92 mL. Diffuse cortical thinning with prominent renal sinus fat. Echogenicity within normal limits. No mass or hydronephrosis visualized. Left  Kidney: Renal measurements: 10.4 x 5.6 x 5.5 cm = volume: 166 mL. Diffuse cortical thinning with prominent renal sinus fat. 3.1 cm upper pole cyst. Echogenicity within normal limits. No mass or hydronephrosis visualized. Bladder: Appears normal for degree of bladder distention. Other: None. IMPRESSION: Moderate bilateral renal cortical atrophy.  No hydronephrosis. Electronically Signed   By: Claudie Revering M.D.   On: 07/12/2019 16:49        Scheduled Meds: . amiodarone  200 mg Oral Daily  . apixaban  2.5 mg Oral BID  . aspirin EC  81 mg Oral Daily  . atorvastatin  80 mg Oral q1800  . chlorhexidine  15 mL Mouth Rinse BID  . Chlorhexidine Gluconate Cloth  6 each Topical Daily  . dorzolamide-timolol  1 drop Both Eyes BID  . latanoprost  1 drop Both Eyes QHS  . mouth rinse  15 mL Mouth Rinse q12n4p  . multivitamin-lutein  1 capsule Oral BID  . omega-3 acid ethyl esters  1 g Oral BID  . senna-docusate  1 tablet Oral BID  . tamsulosin  0.4 mg Oral Daily   Continuous Infusions:   LOS: 4 days    Time spent: 35 minutes    Sidney Ace, MD [facility] Pager [phone]  If 7PM-7AM, please contact night-coverage 07/13/2019, 3:58 PM

## 2019-07-14 LAB — CULTURE, BLOOD (ROUTINE X 2)
Culture: NO GROWTH
Culture: NO GROWTH
Special Requests: ADEQUATE
Special Requests: ADEQUATE

## 2019-07-14 LAB — BASIC METABOLIC PANEL
Anion gap: 12 (ref 5–15)
BUN: 74 mg/dL — ABNORMAL HIGH (ref 8–23)
CO2: 22 mmol/L (ref 22–32)
Calcium: 8.6 mg/dL — ABNORMAL LOW (ref 8.9–10.3)
Chloride: 99 mmol/L (ref 98–111)
Creatinine, Ser: 3.15 mg/dL — ABNORMAL HIGH (ref 0.61–1.24)
GFR calc Af Amer: 19 mL/min — ABNORMAL LOW (ref >60)
GFR calc non Af Amer: 17 mL/min — ABNORMAL LOW (ref >60)
Glucose, Bld: 109 mg/dL — ABNORMAL HIGH (ref 70–99)
Potassium: 4 mmol/L (ref 3.5–5.1)
Sodium: 133 mmol/L — ABNORMAL LOW (ref 135–145)

## 2019-07-14 LAB — CBC
HCT: 34.6 % — ABNORMAL LOW (ref 39.0–52.0)
Hemoglobin: 11.6 g/dL — ABNORMAL LOW (ref 13.0–17.0)
MCH: 30.6 pg (ref 26.0–34.0)
MCHC: 33.5 g/dL (ref 30.0–36.0)
MCV: 91.3 fL (ref 80.0–100.0)
Platelets: 248 10*3/uL (ref 150–400)
RBC: 3.79 MIL/uL — ABNORMAL LOW (ref 4.22–5.81)
RDW: 13 % (ref 11.5–15.5)
WBC: 5.9 10*3/uL (ref 4.0–10.5)
nRBC: 0 % (ref 0.0–0.2)

## 2019-07-14 LAB — MAGNESIUM: Magnesium: 2.5 mg/dL — ABNORMAL HIGH (ref 1.7–2.4)

## 2019-07-14 NOTE — Progress Notes (Signed)
Subjective: Interval History: The patient is feeling well and his wife agrees that he seems to be doing much better today. He was out of bed grooming and brushing his teeth this morning with no issues. Denies any chest pain or shortness of breath. He plans to walk the halls with his wife this afternoon. He is looking forward to returning home.  Creatinine improving from 3.33 yesterday --> 3.15 Urinalysis was negative. Urine output 475 mL yesterday.   Objective: Vital signs in last 24 hours: Temp:  [97.7 F (36.5 C)-98.6 F (37 C)] 97.8 F (36.6 C) (02/11 0751) Pulse Rate:  [47-66] 47 (02/11 0751) Resp:  [18-20] 18 (02/11 0751) BP: (114-127)/(54-86) 125/57 (02/11 0751) SpO2:  [94 %-97 %] 97 % (02/11 0751) Weight:  [75.3 kg] 75.3 kg (02/11 0350) Weight change: -0.771 kg  Intake/Output from previous day: 02/10 0701 - 02/11 0700 In: 0  Out: 475 [Urine:475] Intake/Output this shift: Total I/O In: 240 [P.O.:240] Out: -   Physical Exam: General: Awake, alert, resting comfortably in bed. Wife at bedside. HEENT: Head: atraumatic, normocephalic. Eyes: Conjunctivae clear. Respiratory: Lungs clear to auscultation bilaterally. Cardiac: Irregularly irregular, no murmurs. Radial pulses 2+ bilaterally. Abdomen: Soft, nontender, nondistended. Extremities: No peripheral edema. Skin: Warm, dry, no lesions. Neuro: Alert and oriented x4. Psych: Mood and affect normal.   Lab Results: Recent Labs    07/13/19 0627 07/14/19 0335  WBC 5.8 5.9  HGB 11.1* 11.6*  HCT 32.2* 34.6*  PLT 202 248   BMET:  Recent Labs    07/13/19 0627 07/14/19 0335  NA 129* 133*  K 3.4* 4.0  CL 96* 99  CO2 20* 22  GLUCOSE 101* 109*  BUN 72* 74*  CREATININE 3.33* 3.15*  CALCIUM 8.2* 8.6*   No results for input(s): PTH in the last 72 hours. Iron Studies: No results for input(s): IRON, TIBC, TRANSFERRIN, FERRITIN in the last 72 hours.  Studies/Results: US RENAL  Result Date: 07/12/2019 CLINICAL DATA:   Acute renal failure. EXAM: RENAL / URINARY TRACT ULTRASOUND COMPLETE COMPARISON:  None. FINDINGS: Right Kidney: Renal measurements: 9.4 x 4.6 x 4.1 cm = volume: 92 mL. Diffuse cortical thinning with prominent renal sinus fat. Echogenicity within normal limits. No mass or hydronephrosis visualized. Left Kidney: Renal measurements: 10.4 x 5.6 x 5.5 cm = volume: 166 mL. Diffuse cortical thinning with prominent renal sinus fat. 3.1 cm upper pole cyst. Echogenicity within normal limits. No mass or hydronephrosis visualized. Bladder: Appears normal for degree of bladder distention. Other: None. IMPRESSION: Moderate bilateral renal cortical atrophy.  No hydronephrosis. Electronically Signed   By: Claudie Revering M.D.   On: 07/12/2019 16:49       Current Facility-Administered Medications (Cardiovascular):  .  amiodarone (PACERONE) tablet 200 mg .  atorvastatin (LIPITOR) tablet 80 mg .  nitroGLYCERIN (NITROSTAT) SL tablet 0.4 mg .  omega-3 acid ethyl esters (LOVAZA) capsule 1 g     Current Facility-Administered Medications (Analgesics):  .  acetaminophen (TYLENOL) tablet 650 mg .  aspirin EC tablet 81 mg   Current Facility-Administered Medications (Hematological):  .  apixaban (ELIQUIS) tablet 2.5 mg   Current Facility-Administered Medications (Other):  .  chlorhexidine (PERIDEX) 0.12 % solution 15 mL .  Chlorhexidine Gluconate Cloth 2 % PADS 6 each .  dorzolamide-timolol (COSOPT) 22.3-6.8 MG/ML ophthalmic solution 1 drop .  latanoprost (XALATAN) 0.005 % ophthalmic solution 1 drop .  MEDLINE mouth rinse .  Melatonin TABS 5 mg .  multivitamin-lutein (OCUVITE-LUTEIN) capsule 1 capsule .  ondansetron (  ZOFRAN) injection 4 mg .  polyethylene glycol (MIRALAX / GLYCOLAX) packet 17 g .  polyvinyl alcohol (LIQUIFILM TEARS) 1.4 % ophthalmic solution .  senna-docusate (Senokot-S) tablet 1 tablet .  tamsulosin (FLOMAX) capsule 0.4 mg  No current outpatient medications on  file.   Assessment/Plan: Pt is a 86 y.o.   male with PMHx of CAD s/p CABG x3 with unstable angina, hypertension, hyperlipidemia, atrial fibrillation, carotid artery stenosis, mild aortic stenosis, peripheral vascular disease, CKD stage IIB was admitted on 07/09/2019 with NSTEMI. Hospital course was complicated by worsening chest pain and hypoxemia soon after admission that required ICU monitoring.  Nephrology is consulted for AKI.  Lasix vs renal hypoperfusion is thought to be a contributing factor and the diuretic has now been discontinued after administration of 4 doses on 07/10/19 and 1 dose on 07/11/19.  #Acute kidney injury on CKD stage IIIB - Creatinine trending down. 3.33-->3.15. Baseline creatinine ~1.6/GFR 37 on 11/23/2018. - Urinalysis negative: no proteinuria or muddy brown casts. - Renal US revealed moderate bilateral renal cortical atrophy, no hydronephrosis. This is consistent with diagnosis of CKD. - Agree with holding Lasix, valsartan, any nephrotoxic medications due to AKI on CKD stage IIIB.  Patient and his wife feel comfortable with discharge tomorrow. From a nephrology standpoint he is stable for discharge tomorrow, will await input from cardiology and medicine teams.   LOS: 5 days   Maryln Gottron 07/14/2019,11:52 AM

## 2019-07-14 NOTE — Progress Notes (Signed)
PROGRESS NOTE    Austin [name]  FTD:[id number] DOB: 1928/10/22 DOA: 07/09/2019 PCP: Jerrol Banana., MD   Brief Narrative: Austin [name] is a 86 y.o.Caucasian malewith medical problems of CAD, CABG, A Fib, who was admitted to [facility] on2/6/2021for evaluation of shortness of breath and chest pain. Hospital course complicated by hypoxia and worsening chest pain requiring BiPAP and ICU monitoring.  Pt was transferred to hospitalist service on 07/11/19.  2/10: Patient seen and examined.  Patient's wife at bedside.  Remains stable and appears comfortable on 2 L nasal cannula.  Creatinine slightly trending up to 3.33 today.  Nephrology and cardiology input appreciated  2/11: Patient seen and examined.  Patient's wife is at bedside.  Creatinine improved to 3.15 today.  Appreciate nephrology and cardiology input.  Patient is on room air at this time.  Nasal cannula has been weaned off.    Assessment & Plan:   Principal Problem:   Coronary artery disease involving native coronary artery of native heart with unstable angina pectoris (HCC) Active Problems:   Benign essential HTN   Acid reflux   Glaucoma   Borderline diabetes   Atrial fibrillation (HCC)  Acute Hypoxic Respiratory Failure, improved -due to florid pulmonary edema secondary to acute on chronic diastolic CHF as well as renal insufficiency.  Weaned off BiPAP to 4L and transferred to hospitalist service on 07/11/19. -TTE during this admission showed relatively normal LVEF and right heart function. --Was receiving aggressive IV diuretic.  Lasix d/c'ed on 2/8 due to Cr trending up. --Supplemental oxygen weaned off as of 07/14/2019 PLAN: -continue Bronchodilator Therapy --Hold diuretic since Cr trending up --Strict I/O  -History of severe CAD status post CABG At this time given reduced GFR there are no immediate plans for cardiac catheterization given the imminent risk of worsening renal failure with contrast  administration We will continue goal-directed medical therapy as per cardiology  Renal Failure-acute on chronic stage IIIb --Cr 2.07 on presentation, Baseline Cr 1.64/GFR 37.    trending up to 3's. Likely due to hypoperfusion from Afib and then aggressive diuretic. --Hold diuretic for now --Hold ARB --Nephrology consult Continue to avoid nephrotoxins Discharge planning pending any further inpatient management per nephrology  Atrial fibrillation Per cards, "Apparently new. May have begun prior to admission and loss of atrial kick may have exacerbated his clinical decline." --Cardiology following --s/p amio load, now p.o. --heparin gtt transitioned to Eliquis   DVT prophylaxis: Eliquis Code Status: Full Family Communication: Wife at bedside Disposition Plan: Home, anticipate 24 to 48 hours once kidney function start to improve and confirmation of no immediate plans for ischemic evaluation per cardiology  Consultants:   Nephrology- [facility]  Cardiology-[facility]  Procedures:   None  Antimicrobials:   None   Subjective: Patient seen and examined No acute overnight events No new complaints Titrated off nasal cannula, now on room air  Objective: Vitals:   07/14/19 0202 07/14/19 0350 07/14/19 0751 07/14/19 1207  BP:  127/64 (!) 125/57 (!) 125/56  Pulse: (!) 53 (!) 56 (!) 47 (!) 48  Resp:  20 18 19   Temp:  97.8 F (36.6 C) 97.8 F (36.6 C) 98.6 F (37 C)  TempSrc:  Oral Oral Oral  SpO2:  95% 97% 98%  Weight:  75.3 kg    Height:        Intake/Output Summary (Last 24 hours) at 07/14/2019 1339 Last data filed at 07/14/2019 0915 Gross per 24 hour  Intake 240 ml  Output 475 ml  Net -235 ml   Filed Weights   07/12/19 0358 07/13/19 0500 07/14/19 0350  Weight: 74.1 kg 76 kg 75.3 kg    Examination:  Constitutional: NAD, AAOx3 HEENT: conjunctivae and lids normal, EOMI CV: irregularly irregular no M,R,G. Distal pulses +2.  No cyanosis.    RESP: No obvious crackles, normal respiratory effort, on 2L GI: +BS, NTND Extremities: No effusions, edema, or tenderness in BLE SKIN: warm, dry and intact Neuro: II - XII grossly intact.  Sensation intact Psych: Normal mood and affect.  Appropriate judgement and reason    Data Reviewed: I have personally reviewed following labs and imaging studies  CBC: Recent Labs  Lab 07/10/19 0512 07/11/19 0546 07/12/19 0249 07/13/19 0627 07/14/19 0335  WBC 9.2 7.6 7.1 5.8 5.9  HGB 11.3* 11.6* 11.4* 11.1* 11.6*  HCT 34.3* 34.2* 33.2* 32.2* 34.6*  MCV 92.5 91.4 90.0 88.7 91.3  PLT 173 187 199 202 366   Basic Metabolic Panel: Recent Labs  Lab 07/10/19 0953 07/11/19 0546 07/12/19 0249 07/13/19 0627 07/14/19 0335  NA 135 129* 130* 129* 133*  K 4.1 3.4* 3.6 3.4* 4.0  CL 101 94* 96* 96* 99  CO2 21* 21* 21* 20* 22  GLUCOSE 101* 101* 114* 101* 109*  BUN 48* 66* 75* 72* 74*  CREATININE 2.51* 3.01* 3.12* 3.33* 3.15*  CALCIUM 8.4* 8.0* 7.7* 8.2* 8.6*  MG  --  2.2 2.0 2.2 2.5*  PHOS  --  5.1*  --   --   --    GFR: Estimated Creatinine Clearance: 14.6 mL/min (A) (by C-G formula based on SCr of 3.15 mg/dL (H)). Liver Function Tests: Recent Labs  Lab 07/09/19 1057  AST 22  ALT 24  ALKPHOS 82  BILITOT 1.9*  PROT 7.8  ALBUMIN 4.1   No results for input(s): LIPASE, AMYLASE in the last 168 hours. No results for input(s): AMMONIA in the last 168 hours. Coagulation Profile: Recent Labs  Lab 07/09/19 1113  INR 1.3*   Cardiac Enzymes: No results for input(s): CKTOTAL, CKMB, CKMBINDEX, TROPONINI in the last 168 hours. BNP (last 3 results) No results for input(s): PROBNP in the last 8760 hours. HbA1C: No results for input(s): HGBA1C in the last 72 hours. CBG: Recent Labs  Lab 07/09/19 1524  GLUCAP 141*   Lipid Profile: No results for input(s): CHOL, HDL, LDLCALC, TRIG, CHOLHDL, LDLDIRECT in the last 72 hours. Thyroid Function Tests: No results for input(s): TSH, T4TOTAL,  FREET4, T3FREE, THYROIDAB in the last 72 hours. Anemia Panel: No results for input(s): VITAMINB12, FOLATE, FERRITIN, TIBC, IRON, RETICCTPCT in the last 72 hours. Sepsis Labs: Recent Labs  Lab 07/09/19 1900  PROCALCITON 0.18    Recent Results (from the past 240 hour(s))  Respiratory Panel by RT PCR (Flu A&B, Covid) - Nasopharyngeal Swab     Status: None   Collection Time: 07/09/19 11:42 AM   Specimen: Nasopharyngeal Swab  Result Value Ref Range Status   SARS Coronavirus 2 by RT PCR NEGATIVE NEGATIVE Final    Comment: (NOTE) SARS-CoV-2 target nucleic acids are NOT DETECTED. The SARS-CoV-2 RNA is generally detectable in upper respiratoy specimens during the acute phase of infection. The lowest concentration of SARS-CoV-2 viral copies this assay can detect is 131 copies/mL. A negative result does not preclude SARS-Cov-2 infection and should not be used as the sole basis for treatment or other patient management decisions. A negative result may occur with  improper specimen collection/handling, submission of specimen other than nasopharyngeal swab,  presence of viral mutation(s) within the areas targeted by this assay, and inadequate number of viral copies (<131 copies/mL). A negative result must be combined with clinical observations, patient history, and epidemiological information. The expected result is Negative. Fact Sheet for Patients:  PinkCheek.be Fact Sheet for Healthcare Providers:  GravelBags.it This test is not yet ap proved or cleared by the [place] FDA and  has been authorized for detection and/or diagnosis of SARS-CoV-2 by FDA under an Emergency Use Authorization (EUA). This EUA will remain  in effect (meaning this test can be used) for the duration of the COVID-19 declaration under Section 564(b)(1) of the Act, 21 U.S.C. section 360bbb-3(b)(1), unless the authorization is terminated or revoked sooner.     Influenza A by PCR NEGATIVE NEGATIVE Final   Influenza B by PCR NEGATIVE NEGATIVE Final    Comment: (NOTE) The Xpert Xpress SARS-CoV-2/FLU/RSV assay is intended as an aid in  the diagnosis of influenza from Nasopharyngeal swab specimens and  should not be used as a sole basis for treatment. Nasal washings and  aspirates are unacceptable for Xpert Xpress SARS-CoV-2/FLU/RSV  testing. Fact Sheet for Patients: PinkCheek.be Fact Sheet for Healthcare Providers: GravelBags.it This test is not yet approved or cleared by the [place] FDA and  has been authorized for detection and/or diagnosis of SARS-CoV-2 by  FDA under an Emergency Use Authorization (EUA). This EUA will remain  in effect (meaning this test can be used) for the duration of the  Covid-19 declaration under Section 564(b)(1) of the Act, 21  U.S.C. section 360bbb-3(b)(1), unless the authorization is  terminated or revoked. Performed at [facility], [address] [place], [place] [address]   MRSA PCR Screening     Status: None   Collection Time: 07/09/19  3:29 PM   Specimen: Nasopharyngeal  Result Value Ref Range Status   MRSA by PCR NEGATIVE NEGATIVE Final    Comment:        The GeneXpert MRSA Assay (FDA approved for NASAL specimens only), is one component of a comprehensive MRSA colonization surveillance program. It is not intended to diagnose MRSA infection nor to guide or monitor treatment for MRSA infections. Performed at [facility], [address] [place], [place] [address]   CULTURE, BLOOD (ROUTINE X 2) w Reflex to ID Panel     Status: None   Collection Time: 07/09/19  7:00 PM   Specimen: BLOOD  Result Value Ref Range Status   Specimen Description BLOOD LEFT HAND  Final   Special Requests   Final    BOTTLES DRAWN AEROBIC AND ANAEROBIC Blood Culture adequate volume   Culture   Final    NO GROWTH 5 DAYS Performed at  [facility], [address] [place], [place] [address]    Report Status 07/14/2019 FINAL  Final  CULTURE, BLOOD (ROUTINE X 2) w Reflex to ID Panel     Status: None   Collection Time: 07/09/19  7:15 PM   Specimen: BLOOD  Result Value Ref Range Status   Specimen Description BLOOD LEFT ANTECUBITAL  Final   Special Requests   Final    BOTTLES DRAWN AEROBIC AND ANAEROBIC Blood Culture adequate volume   Culture   Final    NO GROWTH 5 DAYS Performed at [facility], [address], [place], [place] [address]    Report Status 07/14/2019 FINAL  Final         Radiology Studies: US RENAL  Result Date: 07/12/2019 CLINICAL DATA:  Acute renal failure. EXAM: RENAL / URINARY TRACT ULTRASOUND COMPLETE COMPARISON:  None. FINDINGS: Right Kidney: Renal measurements: 9.4 x 4.6 x 4.1 cm = volume: 92 mL. Diffuse cortical thinning with prominent renal sinus fat. Echogenicity within normal limits. No mass or hydronephrosis visualized. Left Kidney: Renal measurements: 10.4 x 5.6 x 5.5 cm = volume: 166 mL. Diffuse cortical thinning with prominent renal sinus fat. 3.1 cm upper pole cyst. Echogenicity within normal limits. No mass or hydronephrosis visualized. Bladder: Appears normal for degree of bladder distention. Other: None. IMPRESSION: Moderate bilateral renal cortical atrophy.  No hydronephrosis. Electronically Signed   By: Claudie Revering M.D.   On: 07/12/2019 16:49        Scheduled Meds: . amiodarone  200 mg Oral Daily  . apixaban  2.5 mg Oral BID  . aspirin EC  81 mg Oral Daily  . atorvastatin  80 mg Oral q1800  . chlorhexidine  15 mL Mouth Rinse BID  . Chlorhexidine Gluconate Cloth  6 each Topical Daily  . dorzolamide-timolol  1 drop Both Eyes BID  . latanoprost  1 drop Both Eyes QHS  . mouth rinse  15 mL Mouth Rinse q12n4p  . multivitamin-lutein  1 capsule Oral BID  . omega-3 acid ethyl esters  1 g Oral BID  . senna-docusate  1 tablet Oral BID  . tamsulosin  0.4 mg Oral  Daily   Continuous Infusions:   LOS: 5 days    Time spent: 35 minutes    Sidney Ace, MD [facility] Pager [phone]  If 7PM-7AM, please contact night-coverage 07/14/2019, 1:39 PM

## 2019-07-14 NOTE — Progress Notes (Signed)
   Patient is in A-Fib at baseline this admission per tele monitor. HR down to low 30s, on assessment patient sleeping, NAD. On arousal rechecked patient's HR back to low 50s. Patient denies CP/SOB.

## 2019-07-14 NOTE — Progress Notes (Signed)
Patient Name: Austin [name] Date of Encounter: 07/14/2019  Hospital Problem List     Principal Problem:   Coronary artery disease involving native coronary artery of native heart with unstable angina pectoris [facility]) Active Problems:   Benign essential HTN   Acid reflux   Glaucoma   Borderline diabetes   Atrial fibrillation [facility])    Patient Profile     86 y.o.malewith history ofCAD s/p CABG x 3 in 2007 with LIMA to LAD, SVG to OM1, and PDA (cath from 2016 with 3/3 functional grafts), mild aortic stenosis, HTN, HLD, and carotid artery stenosis (40-59% to bilateral ICAs), followed by Dr. Gabriel Carina presents emergency room today with chest pain that started approximately 3 AMon day of admission.. Hereportednausea, diaphoresis and shortness of breath. Pain was 7 on 10 on presentation which improved with sublingual nitroglycerin. Stated his pain was similar to his angina. EKG in the emergency room revealed ST depression in the lateral leads. Initial troponin was 41 with a subsequent value of 51. He was started on heparin. BNP was 304. Creatinine was 2.07 with a baseline of approximately 1.5.Echo showed near normal LV function EF 50 to 55% with no obvious regional wall motion abnormality. Sclerotic to minimally stenotic aortic valve with no significant AI MR or TR. Respiratory status improving. Mild troponin bump. BNP improvedto. to 337. Renal function has improved slightly today. He is currently off of diuresis and doing ok. On room air. .  Subjective   No chest pain. Resting comfortably on 2 liters oxygen.   Inpatient Medications    . amiodarone  200 mg Oral Daily  . apixaban  2.5 mg Oral BID  . aspirin EC  81 mg Oral Daily  . atorvastatin  80 mg Oral q1800  . chlorhexidine  15 mL Mouth Rinse BID  . Chlorhexidine Gluconate Cloth  6 each Topical Daily  . dorzolamide-timolol  1 drop Both Eyes BID  . latanoprost  1 drop Both Eyes QHS  . mouth rinse  15 mL  Mouth Rinse q12n4p  . multivitamin-lutein  1 capsule Oral BID  . omega-3 acid ethyl esters  1 g Oral BID  . senna-docusate  1 tablet Oral BID  . tamsulosin  0.4 mg Oral Daily    Vital Signs    Vitals:   07/13/19 2036 07/14/19 0202 07/14/19 0350 07/14/19 0751  BP: (!) 114/58  127/64 (!) 125/57  Pulse: 66 (!) 53 (!) 56 (!) 47  Resp:   20 18  Temp: 98.2 F (36.8 C)  97.8 F (36.6 C) 97.8 F (36.6 C)  TempSrc: Oral  Oral Oral  SpO2: 94%  95% 97%  Weight:   75.3 kg   Height:        Intake/Output Summary (Last 24 hours) at 07/14/2019 0826 Last data filed at 07/14/2019 0436 Gross per 24 hour  Intake 0 ml  Output 475 ml  Net -475 ml   Filed Weights   07/12/19 0358 07/13/19 0500 07/14/19 0350  Weight: 74.1 kg 76 kg 75.3 kg    Physical Exam    GEN: Well nourished, well developed, in no acute distress.  HEENT: normal.  Neck: Supple, no JVD, carotid bruits, or masses. Cardiac: irreg, irreg.  Respiratory:  Respirations regular and unlabored, clear to auscultation bilaterally. GI: Soft, nontender, nondistended, BS + x 4. MS: no deformity or atrophy. Skin: warm and dry, no rash. Neuro:  Strength and sensation are intact. Psych: Normal affect.  Labs    CBC Recent Labs  07/13/19 0627 07/14/19 0335  WBC 5.8 5.9  HGB 11.1* 11.6*  HCT 32.2* 34.6*  MCV 88.7 91.3  PLT 202 001   Basic Metabolic Panel Recent Labs    07/13/19 0627 07/14/19 0335  NA 129* 133*  K 3.4* 4.0  CL 96* 99  CO2 20* 22  GLUCOSE 101* 109*  BUN 72* 74*  CREATININE 3.33* 3.15*  CALCIUM 8.2* 8.6*  MG 2.2 2.5*   Liver Function Tests No results for input(s): AST, ALT, ALKPHOS, BILITOT, PROT, ALBUMIN in the last 72 hours. No results for input(s): LIPASE, AMYLASE in the last 72 hours. Cardiac Enzymes No results for input(s): CKTOTAL, CKMB, CKMBINDEX, TROPONINI in the last 72 hours. BNP No results for input(s): BNP in the last 72 hours. D-Dimer No results for input(s): DDIMER in the last 72  hours. Hemoglobin A1C No results for input(s): HGBA1C in the last 72 hours. Fasting Lipid Panel No results for input(s): CHOL, HDL, LDLCALC, TRIG, CHOLHDL, LDLDIRECT in the last 72 hours. Thyroid Function Tests No results for input(s): TSH, T4TOTAL, T3FREE, THYROIDAB in the last 72 hours.  Invalid input(s): FREET3  Telemetry    afib  ECG    afib with rvr  Radiology    US RENAL  Result Date: 07/12/2019 CLINICAL DATA:  Acute renal failure. EXAM: RENAL / URINARY TRACT ULTRASOUND COMPLETE COMPARISON:  None. FINDINGS: Right Kidney: Renal measurements: 9.4 x 4.6 x 4.1 cm = volume: 92 mL. Diffuse cortical thinning with prominent renal sinus fat. Echogenicity within normal limits. No mass or hydronephrosis visualized. Left Kidney: Renal measurements: 10.4 x 5.6 x 5.5 cm = volume: 166 mL. Diffuse cortical thinning with prominent renal sinus fat. 3.1 cm upper pole cyst. Echogenicity within normal limits. No mass or hydronephrosis visualized. Bladder: Appears normal for degree of bladder distention. Other: None. IMPRESSION: Moderate bilateral renal cortical atrophy.  No hydronephrosis. Electronically Signed   By: Claudie Revering M.D.   On: 07/12/2019 16:49   DG Chest Port 1 View  Result Date: 07/10/2019 CLINICAL DATA:  Chest pain and shortness of breath EXAM: PORTABLE CHEST 1 VIEW COMPARISON:  07/09/2019 FINDINGS: Cardiomegaly and CABG changes again noted. Bilateral interstitial and airspace opacities, RIGHT greater than LEFT, are slightly improved. There may be trace bilateral pleural effusions present. No pneumothorax. IMPRESSION: Slightly improved bilateral interstitial and airspace opacities, RIGHT greater than LEFT. Electronically Signed   By: Margarette Canada M.D.   On: 07/10/2019 10:58   DG Chest Port 1 View  Result Date: 07/09/2019 CLINICAL DATA:  Chest pain EXAM: PORTABLE CHEST 1 VIEW COMPARISON:  07/09/2019 FINDINGS: Bilateral airspace disease again noted, worsening on the right since prior study  with slight improvement on the left. Cardiomegaly. Suspect small right pleural effusion. No acute bony abnormality. IMPRESSION: Bilateral airspace disease, improving on the left and worsening on the right. This could reflect asymmetric edema or infection. Small right effusion. Electronically Signed   By: Rolm Baptise M.D.   On: 07/09/2019 17:15   DG Chest Port 1 View  Result Date: 07/09/2019 CLINICAL DATA:  Chest pain. EXAM: PORTABLE CHEST 1 VIEW COMPARISON:  August 23, 2010. FINDINGS: Stable cardiomegaly. Status post coronary bypass graft. Interstitial densities are noted throughout both lungs most consistent with pulmonary edema. No pneumothorax or pleural effusion is noted. Bony thorax is unremarkable. IMPRESSION: Findings consistent with bilateral pulmonary edema. Electronically Signed   By: Marijo Conception M.D.   On: 07/09/2019 11:35   ECHOCARDIOGRAM COMPLETE  Result Date: 07/11/2019   ECHOCARDIOGRAM REPORT  Patient Name:   TEION BALLIN Date of Exam: 07/10/2019 Medical Rec #:  [id number]        Height:       66.0 in Accession #:    [id number]       Weight:       172.8 lb Date of Birth:  [birth year]         BSA:          1.88 m Patient Age:    33 years         BP:           140/60 mmHg Patient Gender: M                HR:           66 bpm. Exam Location:  [facility] Procedure: 2D Echo and Intracardiac Opacification Agent Indications:     NSTEMI I21.4  History:         Patient has no prior history of Echocardiogram examinations.  Sonographer:     Arville Go RDCS Referring Phys:  [place] Diagnosing Phys: Bartholome Bill MD  Sonographer Comments: Technically difficult study due to poor echo windows. IMPRESSIONS  1. Left ventricular ejection fraction, by visual estimation, is 50 to 55%. The left ventricle has low normal function. Left ventricular septal wall thickness was mildly increased. Mildly increased left ventricular posterior wall thickness. There is mildly increased left ventricular hypertrophy.   2. Definity contrast agent was given IV to delineate the left ventricular endocardial borders.  3. Left ventricular diastolic parameters are consistent with Grade I diastolic dysfunction (impaired relaxation).  4. The left ventricle has no regional wall motion abnormalities.  5. Global right ventricle has normal systolic function.The right ventricular size is normal. No increase in right ventricular wall thickness.  6. Left atrial size was mildly dilated.  7. Right atrial size was normal.  8. The mitral valve is degenerative. Mild mitral valve regurgitation.  9. The tricuspid valve is not well visualized. 10. The tricuspid valve is not well visualized. Tricuspid valve regurgitation is trivial. 11. The aortic valve is abnormal. Aortic valve regurgitation is trivial. Mild to moderate aortic valve sclerosis/calcification without any evidence of aortic stenosis. 12. The pulmonic valve was not well visualized. Pulmonic valve regurgitation is trivial. 13. The atrial septum is grossly normal. FINDINGS  Left Ventricle: Left ventricular ejection fraction, by visual estimation, is 50 to 55%. The left ventricle has low normal function. Definity contrast agent was given IV to delineate the left ventricular endocardial borders. The left ventricle has no regional wall motion abnormalities. Mildly increased left ventricular posterior wall thickness. There is mildly increased left ventricular hypertrophy. Left ventricular diastolic parameters are consistent with Grade I diastolic dysfunction (impaired relaxation). Right Ventricle: The right ventricular size is normal. No increase in right ventricular wall thickness. Global RV systolic function is has normal systolic function. Left Atrium: Left atrial size was mildly dilated. Right Atrium: Right atrial size was normal in size Pericardium: There is no evidence of pericardial effusion. Mitral Valve: The mitral valve is degenerative in appearance. Mild mitral valve regurgitation.  Tricuspid Valve: The tricuspid valve is not well visualized. Tricuspid valve regurgitation is trivial. Aortic Valve: The aortic valve is abnormal. Aortic valve regurgitation is trivial. Mild to moderate aortic valve sclerosis/calcification is present, without any evidence of aortic stenosis. Aortic valve mean gradient measures 9.0 mmHg. Aortic valve peak gradient measures 17.1 mmHg. Aortic valve area, by VTI measures 1.73 cm. Pulmonic Valve:  The pulmonic valve was not well visualized. Pulmonic valve regurgitation is trivial. Pulmonic regurgitation is trivial. Aorta: The aortic root is normal in size and structure. IAS/Shunts: The atrial septum is grossly normal.  LEFT VENTRICLE PLAX 2D LVIDd:         5.46 cm       Diastology LVIDs:         4.32 cm       LV e' lateral:   9.79 cm/s LV PW:         1.08 cm       LV E/e' lateral: 13.0 LV IVS:        1.43 cm LVOT diam:     2.10 cm LV SV:         61 ml LV SV Index:   31.60 LVOT Area:     3.46 cm  LV Volumes (MOD) LV area d, A2C:    38.40 cm LV area d, A4C:    28.20 cm LV area s, A2C:    27.50 cm LV area s, A4C:    17.70 cm LV major d, A2C:   7.94 cm LV major d, A4C:   6.99 cm LV major s, A2C:   7.12 cm LV major s, A4C:   6.66 cm LV vol d, MOD A2C: 150.0 ml LV vol d, MOD A4C: 92.8 ml LV vol s, MOD A2C: 85.6 ml LV vol s, MOD A4C: 38.3 ml LV SV MOD A2C:     64.4 ml LV SV MOD A4C:     92.8 ml LV SV MOD BP:      66.5 ml RIGHT VENTRICLE RV Basal diam:  2.63 cm RV S prime:     8.27 cm/s TAPSE (M-mode): 1.3 cm LEFT ATRIUM             Index       RIGHT ATRIUM           Index LA diam:        4.80 cm 2.55 cm/m  RA Area:     16.20 cm LA Vol (A2C):   73.7 ml 39.21 ml/m RA Volume:   40.10 ml  21.34 ml/m LA Vol (A4C):   40.9 ml 21.76 ml/m LA Biplane Vol: 57.6 ml 30.65 ml/m  AORTIC VALVE                    PULMONIC VALVE AV Area (Vmax):    1.56 cm     PV Vmax:       1.09 m/s AV Area (Vmean):   1.61 cm     PV Peak grad:  4.8 mmHg AV Area (VTI):     1.73 cm AV Vmax:            207.00 cm/s AV Vmean:          137.000 cm/s AV VTI:            0.374 m AV Peak Grad:      17.1 mmHg AV Mean Grad:      9.0 mmHg LVOT Vmax:         93.20 cm/s LVOT Vmean:        63.500 cm/s LVOT VTI:          0.187 m LVOT/AV VTI ratio: 0.50  AORTA Ao Root diam: 2.60 cm Ao Asc diam:  3.50 cm MITRAL VALVE MV Area (PHT): 3.60 cm             SHUNTS MV PHT:  61.19 msec           Systemic VTI:  0.19 m MV Decel Time: 211 msec             Systemic Diam: 2.10 cm MV E velocity: 127.00 cm/s 103 cm/s  Bartholome Bill MD Electronically signed by Bartholome Bill MD Signature Date/Time: 07/11/2019/7:24:55 AM    Final     Assessment & Plan     86 y.o.malewith history ofCAD s/p CABG x 3 in 2007 with LIMA to LAD, SVG to OM1, and PDA (cath from 2016 with 3/3 functional grafts), mild aortic stenosis, HTN, HLD, and carotid artery stenosis (40-59% to bilateral ICAs), followed by Dr. Juanetta Gosling presented to the ER with chest pain starting at 3 AM this morning. EKG showed ST depression in the lateral leads which was somewhat more normal than his baseline.Currently shows atrial fibrillation withslowventricular responseinitial troponin was 41 followed by 51with peaked at 240..  1. Coronary artery disease  Status post coronary artery bypass grafting x3 in 2007 with a LIMA to the LAD, vein graft to OM1 and PDA. Cardiac cath in 2016 revealed 3 functional grasp with mild aortic stenosis. Presented with chest tightness shortness of breath and hypoxia. Had a mild troponin elevation peaking at 240. Has no chest pain at present. No ischemic changes on electrocardiogram.  Would continue with heparin IV, control rate with metoprolol tartrate 12.5 mg twice daily for now. . We will continue with IV nitroglycerin to titrate pressure and for afterload reduction. Continue with atorvastatin and aspirin. Likely not an acute coronary event but demand ischemiawith afib with rvr andwith limited coronary reserveand at  least mild aortic stenosis.Not a candidate for  invasive evaluation for now due to renal function. Will continue to  treat with asa and  eliquis at 2.5 mg bid due to renal function and age.We will continue with atorvastatin. Hemodynamically stable. Off of beta blocker for now due to bradycardia.   2. Respiratory failure  Oxygenation improved and now on room air.BNPimproved . 3. Acute on chronic renal insufficiency  Renal function decreased further.Diuresing fairly welland is -3.2 L since admission. Will need to follow renal function.Creatinine 3.33this morning with a GFR of 16down from 22 onadmission. Likelyacute on chronic kidney injurydue to hypoxia and hemodynamics with events of yesterday. We will continue to follow and adjust renally cleared drugsas needed. Appreciate Dr. Candiss Norse input. Not candidate for contrast studies at present.   4. Atrial fibrillation  Apparently new. May have begun prior to admission and loss of atrial kickand rapid ventricular response likelyexacerbated his clinical decline. Will treat with  amiodarone po at  200 mg daily following heart rate and hemodynamics.  Continue with Eliquis 2.5 bid.   5. Congestive heart failure  Echo revealed fairly preserved LV function EF around 50% globally with mild LVH. A sclerotic tomildlystenotic aortic valve. No obvious wall motion abnormality.Off diuretics. Will follow renal function and volume status. Has diuresed nearly 4 liters and appoximately 3 kg since admission. EF about50% so we will continue with metoprolol tartrate. Would not afterload reduced at present given preserved LV function and acute renal insufficiencyfor now.  OK for discharge in am if stable. Out patient follow up with Dr. Nehemiah Massed has been ordered. Discharge on amiodarone 200 daiily, atorvastatin 80 mg daily, asa 81 mg daily, apixaban 2.5 mg bid,  Signed, Javier Docker.  Spyridon Hornstein MD 07/14/2019, 8:26 AM  Pager: [phone]

## 2019-07-15 LAB — BASIC METABOLIC PANEL
Anion gap: 12 (ref 5–15)
BUN: 62 mg/dL — ABNORMAL HIGH (ref 8–23)
CO2: 18 mmol/L — ABNORMAL LOW (ref 22–32)
Calcium: 8.8 mg/dL — ABNORMAL LOW (ref 8.9–10.3)
Chloride: 103 mmol/L (ref 98–111)
Creatinine, Ser: 2.6 mg/dL — ABNORMAL HIGH (ref 0.61–1.24)
GFR calc Af Amer: 24 mL/min — ABNORMAL LOW (ref >60)
GFR calc non Af Amer: 21 mL/min — ABNORMAL LOW (ref >60)
Glucose, Bld: 103 mg/dL — ABNORMAL HIGH (ref 70–99)
Potassium: 4.1 mmol/L (ref 3.5–5.1)
Sodium: 133 mmol/L — ABNORMAL LOW (ref 135–145)

## 2019-07-15 LAB — CBC
HCT: 34.7 % — ABNORMAL LOW (ref 39.0–52.0)
Hemoglobin: 11.7 g/dL — ABNORMAL LOW (ref 13.0–17.0)
MCH: 30.3 pg (ref 26.0–34.0)
MCHC: 33.7 g/dL (ref 30.0–36.0)
MCV: 89.9 fL (ref 80.0–100.0)
Platelets: 236 10*3/uL (ref 150–400)
RBC: 3.86 MIL/uL — ABNORMAL LOW (ref 4.22–5.81)
RDW: 12.8 % (ref 11.5–15.5)
WBC: 6.9 10*3/uL (ref 4.0–10.5)
nRBC: 0 % (ref 0.0–0.2)

## 2019-07-15 LAB — MAGNESIUM: Magnesium: 2.4 mg/dL (ref 1.7–2.4)

## 2019-07-15 MED ORDER — AMIODARONE HCL 200 MG PO TABS: 200.0000 mg | ORAL_TABLET | Freq: Every day | ORAL | 0 refills | Status: DC

## 2019-07-15 MED ORDER — APIXABAN 2.5 MG PO TABS: 2.5000 mg | ORAL_TABLET | Freq: Two times a day (BID) | ORAL | 0 refills | Status: DC

## 2019-07-15 MED ORDER — ASPIRIN 81 MG PO TBEC: 81.0000 mg | DELAYED_RELEASE_TABLET | Freq: Every day | ORAL | 0 refills | Status: DC

## 2019-07-15 NOTE — Discharge Summary (Signed)
Physician Discharge Summary  Austin [name] IRC:[id number] DOB: 03-Nov-1928 DOA: 07/09/2019  PCP: Jerrol Banana., MD  Admit date: 07/09/2019 Discharge date: 07/15/2019  Admitted From: Home Disposition: Home  Recommendations for Outpatient Follow-up:  1. Follow up with PCP in 1-2 weeks 2. Follow-up with cardiology as directed 3. Follow-up with nephrology as directed  Home Health: No Equipment/Devices: None  Discharge Condition: Stable CODE STATUS: Full Diet recommendation: Heart Healthy  Brief/Interim Summary: blayze haen y.o.Caucasianmalewith medical problems of CAD, CABG, A Fib, who was [facility] on[facility]6/2021for evaluation of shortness of breath and chest pain. Hospital course complicated by hypoxia and worsening chest pain requiringBiPAP andICU monitoring.Pt was transferred to hospitalist service on 07/11/19.  2/10: Patient seen and examined.  Patient's wife at bedside.  Remains stable and appears comfortable on 2 L nasal cannula.  Creatinine slightly trending up to 3.33 today.  Nephrology and cardiology input appreciated  2/11: Patient seen and examined.  Patient's wife is at bedside.  Creatinine improved to 3.15 today.  Appreciate nephrology and cardiology input.  Patient is on room air at this time.  Nasal cannula has been weaned off.  2/12: Patient seen and examined on the day of discharge.  Patient's wife not at bedside at time of my evaluation.  Creatinine improved to 2.6.  Discussed with nephrology.  Cleared for discharge.  Appreciate signout recommendations from cardiology.  Patient stable for discharge home at this time.  Nasal cannula weaned off, patient on room air  Discharge Diagnoses:  Principal Problem:   Coronary artery disease involving native coronary artery of native heart with unstable angina pectoris ([place]) Active Problems:   Benign essential HTN   Acid reflux   Glaucoma   Borderline diabetes   Atrial fibrillation  (HCC)  Acute Hypoxic Respiratory Failure, resolved -due to florid pulmonary edema secondary to acute on chronicdiastolicCHF as well as renal insufficiency. Weaned off BiPAP to [place] transferred to hospitalist service on 07/11/19. -TTE during this admission showed relatively normal LVEF and right heart function. --Was receiving aggressive IV diuretic. Lasix d/c'ed on 2/8 due to Cr trending up. --Supplemental oxygen weaned off as of 07/14/2019 --Diuretic on hold post discharge in the setting of AKI --Follow-up with cardiology and nephrology  -History of severe CAD status post CABG At this time given reduced GFR there are no immediate plans for cardiac catheterization given the imminent risk of worsening renal failure with contrast administration We will continue goal-directed medical therapy as per cardiology  Renal Failure-acute on chronic stageIIIb --Cr 2.07 on presentation,Baseline Cr 1.64/GFR 37. trending up to 3's.Likely due to hypoperfusion from Afib and then aggressive diuretic. Improved to 2.6 on the day of discharge All nephrotoxins held at time of discharge including ARB and diuretic Follow-up outpatient with nephrology   Atrial fibrillation Per cards, "Apparently new. May have begun prior to admission and loss of atrial kick may have exacerbated his clinical decline." --Cardiology following --s/p amio load, now p.o. --heparin gtt transitioned to Eliquis --Eliquis prescribed on discharge   Discharge Instructions  Discharge Instructions    Diet - low sodium heart healthy   Complete by: As directed    Increase activity slowly   Complete by: As directed      Allergies as of 07/15/2019      Reactions   Penicillins Itching, Swelling, Rash, Other (See Comments)   Has patient had a PCN reaction causing immediate rash, facial/tongue/throat swelling, SOB or lightheadedness with hypotension: Yes Has patient had a PCN reaction  causing severe rash involving mucus  membranes or skin necrosis: No Has patient had a PCN reaction that required hospitalization No Has patient had a PCN reaction occurring within the last 10 years: No If all of the above answers are "NO", then may proceed with Cephalosporin use.      Medication List    STOP taking these medications   Diovan HCT 320-25 MG tablet Generic drug: valsartan-hydrochlorothiazide     TAKE these medications   amiodarone 200 MG tablet Commonly known as: PACERONE Take 1 tablet (200 mg total) by mouth daily. Start taking on: July 16, 2019   amLODipine 5 MG tablet Commonly known as: NORVASC Take 5 mg by mouth daily.   apixaban 2.5 MG Tabs tablet Commonly known as: ELIQUIS Take 1 tablet (2.5 mg total) by mouth 2 (two) times daily.   aspirin 81 MG EC tablet Take 1 tablet (81 mg total) by mouth daily. Start taking on: July 16, 2019   atorvastatin 20 MG tablet Commonly known as: LIPITOR Take 20 mg by mouth daily.   Biotin 5000 MCG Caps Take 5,000 mcg by mouth daily.   CoQ-10 200 MG Caps Take 200 mg by mouth daily.   dorzolamide-timolol 22.3-6.8 MG/ML ophthalmic solution Commonly known as: COSOPT Place 1 drop into both eyes 2 (two) times daily.   Fish Oil 1200 MG Caps Take 1,200 mg by mouth daily.   GLUCOSAMINE PO Take 1 tablet by mouth 2 (two) times daily.   isosorbide mononitrate 60 MG 24 hr tablet Commonly known as: IMDUR Take 60 mg by mouth daily.   Melatonin 5 MG Caps Take 5 mg by mouth at bedtime as needed (for sleep).   Nitrostat 0.4 MG SL tablet Generic drug: nitroGLYCERIN Place 0.4 mg under the tongue every 5 (five) minutes as needed for chest pain.   omeprazole 20 MG capsule Commonly known as: PRILOSEC Take 20 mg by mouth daily.   PreserVision AREDS 2 Caps Take 1 capsule by mouth 2 (two) times daily.   SALONPAS PAIN RELIEF PATCH EX Apply 1 patch topically daily as needed (for pain).   SYSTANE OP Place 1 drop into the right eye daily as needed (for  blurriness).   tamsulosin 0.4 MG Caps capsule Commonly known as: FLOMAX TAKE 1 CAPSULE BY MOUTH EVERY DAY   Travatan Z 0.004 % Soln ophthalmic solution Generic drug: Travoprost (BAK Free) Place 1 drop into both eyes at bedtime.   Turmeric 500 MG Tabs Take 1,000 mg by mouth daily.   vitamin B-12 1000 MCG tablet Commonly known as: CYANOCOBALAMIN Take 1,000 mcg by mouth daily.   Vitamin D3 50 MCG (2000 UT) capsule Take 2,000 Units by mouth 2 (two) times daily.       Allergies  Allergen Reactions  . Penicillins Itching, Swelling, Rash and Other (See Comments)    Has patient had a PCN reaction causing immediate rash, facial/tongue/throat swelling, SOB or lightheadedness with hypotension: Yes Has patient had a PCN reaction causing severe rash involving mucus membranes or skin necrosis: No Has patient had a PCN reaction that required hospitalization No Has patient had a PCN reaction occurring within the last 10 years: No If all of the above answers are "NO", then may proceed with Cephalosporin use.     Consultations:  Cardiology-[facility]  Nephrology-[facility]   Procedures/Studies: US RENAL  Result Date: 07/12/2019 CLINICAL DATA:  Acute renal failure. EXAM: RENAL / URINARY TRACT ULTRASOUND COMPLETE COMPARISON:  None. FINDINGS: Right Kidney: Renal measurements: 9.4  x 4.6 x 4.1 cm = volume: 92 mL. Diffuse cortical thinning with prominent renal sinus fat. Echogenicity within normal limits. No mass or hydronephrosis visualized. Left Kidney: Renal measurements: 10.4 x 5.6 x 5.5 cm = volume: 166 mL. Diffuse cortical thinning with prominent renal sinus fat. 3.1 cm upper pole cyst. Echogenicity within normal limits. No mass or hydronephrosis visualized. Bladder: Appears normal for degree of bladder distention. Other: None. IMPRESSION: Moderate bilateral renal cortical atrophy.  No hydronephrosis. Electronically Signed   By: Claudie Revering M.D.   On: 07/12/2019 16:49    DG Chest Port 1 View  Result Date: 07/10/2019 CLINICAL DATA:  Chest pain and shortness of breath EXAM: PORTABLE CHEST 1 VIEW COMPARISON:  07/09/2019 FINDINGS: Cardiomegaly and CABG changes again noted. Bilateral interstitial and airspace opacities, RIGHT greater than LEFT, are slightly improved. There may be trace bilateral pleural effusions present. No pneumothorax. IMPRESSION: Slightly improved bilateral interstitial and airspace opacities, RIGHT greater than LEFT. Electronically Signed   By: Margarette Canada M.D.   On: 07/10/2019 10:58   DG Chest Port 1 View  Result Date: 07/09/2019 CLINICAL DATA:  Chest pain EXAM: PORTABLE CHEST 1 VIEW COMPARISON:  07/09/2019 FINDINGS: Bilateral airspace disease again noted, worsening on the right since prior study with slight improvement on the left. Cardiomegaly. Suspect small right pleural effusion. No acute bony abnormality. IMPRESSION: Bilateral airspace disease, improving on the left and worsening on the right. This could reflect asymmetric edema or infection. Small right effusion. Electronically Signed   By: Rolm Baptise M.D.   On: 07/09/2019 17:15   DG Chest Port 1 View  Result Date: 07/09/2019 CLINICAL DATA:  Chest pain. EXAM: PORTABLE CHEST 1 VIEW COMPARISON:  August 23, 2010. FINDINGS: Stable cardiomegaly. Status post coronary bypass graft. Interstitial densities are noted throughout both lungs most consistent with pulmonary edema. No pneumothorax or pleural effusion is noted. Bony thorax is unremarkable. IMPRESSION: Findings consistent with bilateral pulmonary edema. Electronically Signed   By: Marijo Conception M.D.   On: 07/09/2019 11:35   ECHOCARDIOGRAM COMPLETE  Result Date: 07/11/2019   ECHOCARDIOGRAM REPORT   Patient Name:   Austin [name] Date of Exam: 07/10/2019 Medical Rec #:  [id number]        Height:       66.0 in Accession #:    [id number]       Weight:       172.8 lb Date of Birth:  1929-05-29         BSA:          1.88 m Patient Age:    86 years          BP:           140/60 mmHg Patient Gender: M                HR:           66 bpm. Exam Location:  [facility] Procedure: 2D Echo and Intracardiac Opacification Agent Indications:     NSTEMI I21.4  History:         Patient has no prior history of Echocardiogram examinations.  Sonographer:     Arville Go RDCS Referring Phys:  [place] Diagnosing Phys: Bartholome Bill MD  Sonographer Comments: Technically difficult study due to poor echo windows. IMPRESSIONS  1. Left ventricular ejection fraction, by visual estimation, is 50 to 55%. The left ventricle has low normal function. Left ventricular septal wall thickness was mildly increased. Mildly increased left  ventricular posterior wall thickness. There is mildly increased left ventricular hypertrophy.  2. Definity contrast agent was given IV to delineate the left ventricular endocardial borders.  3. Left ventricular diastolic parameters are consistent with Grade I diastolic dysfunction (impaired relaxation).  4. The left ventricle has no regional wall motion abnormalities.  5. Global right ventricle has normal systolic function.The right ventricular size is normal. No increase in right ventricular wall thickness.  6. Left atrial size was mildly dilated.  7. Right atrial size was normal.  8. The mitral valve is degenerative. Mild mitral valve regurgitation.  9. The tricuspid valve is not well visualized. 10. The tricuspid valve is not well visualized. Tricuspid valve regurgitation is trivial. 11. The aortic valve is abnormal. Aortic valve regurgitation is trivial. Mild to moderate aortic valve sclerosis/calcification without any evidence of aortic stenosis. 12. The pulmonic valve was not well visualized. Pulmonic valve regurgitation is trivial. 13. The atrial septum is grossly normal. FINDINGS  Left Ventricle: Left ventricular ejection fraction, by visual estimation, is 50 to 55%. The left ventricle has low normal function. Definity contrast agent was given IV to  delineate the left ventricular endocardial borders. The left ventricle has no regional wall motion abnormalities. Mildly increased left ventricular posterior wall thickness. There is mildly increased left ventricular hypertrophy. Left ventricular diastolic parameters are consistent with Grade I diastolic dysfunction (impaired relaxation). Right Ventricle: The right ventricular size is normal. No increase in right ventricular wall thickness. Global RV systolic function is has normal systolic function. Left Atrium: Left atrial size was mildly dilated. Right Atrium: Right atrial size was normal in size Pericardium: There is no evidence of pericardial effusion. Mitral Valve: The mitral valve is degenerative in appearance. Mild mitral valve regurgitation. Tricuspid Valve: The tricuspid valve is not well visualized. Tricuspid valve regurgitation is trivial. Aortic Valve: The aortic valve is abnormal. Aortic valve regurgitation is trivial. Mild to moderate aortic valve sclerosis/calcification is present, without any evidence of aortic stenosis. Aortic valve mean gradient measures 9.0 mmHg. Aortic valve peak gradient measures 17.1 mmHg. Aortic valve area, by VTI measures 1.73 cm. Pulmonic Valve: The pulmonic valve was not well visualized. Pulmonic valve regurgitation is trivial. Pulmonic regurgitation is trivial. Aorta: The aortic root is normal in size and structure. IAS/Shunts: The atrial septum is grossly normal.  LEFT VENTRICLE PLAX 2D LVIDd:         5.46 cm       Diastology LVIDs:         4.32 cm       LV e' lateral:   9.79 cm/s LV PW:         1.08 cm       LV E/e' lateral: 13.0 LV IVS:        1.43 cm LVOT diam:     2.10 cm LV SV:         61 ml LV SV Index:   31.60 LVOT Area:     3.46 cm  LV Volumes (MOD) LV area d, A2C:    38.40 cm LV area d, A4C:    28.20 cm LV area s, A2C:    27.50 cm LV area s, A4C:    17.70 cm LV major d, A2C:   7.94 cm LV major d, A4C:   6.99 cm LV major s, A2C:   7.12 cm LV major s, A4C:    6.66 cm LV vol d, MOD A2C: 150.0 ml LV vol d, MOD A4C: 92.8 ml LV vol s, MOD  A2C: 85.6 ml LV vol s, MOD A4C: 38.3 ml LV SV MOD A2C:     64.4 ml LV SV MOD A4C:     92.8 ml LV SV MOD BP:      66.5 ml RIGHT VENTRICLE RV Basal diam:  2.63 cm RV S prime:     8.27 cm/s TAPSE (M-mode): 1.3 cm LEFT ATRIUM             Index       RIGHT ATRIUM           Index LA diam:        4.80 cm 2.55 cm/m  RA Area:     16.20 cm LA Vol (A2C):   73.7 ml 39.21 ml/m RA Volume:   40.10 ml  21.34 ml/m LA Vol (A4C):   40.9 ml 21.76 ml/m LA Biplane Vol: 57.6 ml 30.65 ml/m  AORTIC VALVE                    PULMONIC VALVE AV Area (Vmax):    1.56 cm     PV Vmax:       1.09 m/s AV Area (Vmean):   1.61 cm     PV Peak grad:  4.8 mmHg AV Area (VTI):     1.73 cm AV Vmax:           207.00 cm/s AV Vmean:          137.000 cm/s AV VTI:            0.374 m AV Peak Grad:      17.1 mmHg AV Mean Grad:      9.0 mmHg LVOT Vmax:         93.20 cm/s LVOT Vmean:        63.500 cm/s LVOT VTI:          0.187 m LVOT/AV VTI ratio: 0.50  AORTA Ao Root diam: 2.60 cm Ao Asc diam:  3.50 cm MITRAL VALVE MV Area (PHT): 3.60 cm             SHUNTS MV PHT:        61.19 msec           Systemic VTI:  0.19 m MV Decel Time: 211 msec             Systemic Diam: 2.10 cm MV E velocity: 127.00 cm/s 103 cm/s  Bartholome Bill MD Electronically signed by Bartholome Bill MD Signature Date/Time: 07/11/2019/7:24:55 AM    Final     (Echo, Carotid, EGD, Colonoscopy, ERCP)    Subjective: Seen and examined on the day of discharge No complaints Pain-free Medically stable for discharge home  Discharge Exam: Vitals:   07/15/19 0750 07/15/19 1139  BP: 135/79 (!) 152/65  Pulse: 63 61  Resp: 16 17  Temp: 98.3 F (36.8 C) 98.1 F (36.7 C)  SpO2: 98% 99%   Vitals:   07/14/19 1953 07/15/19 0428 07/15/19 0750 07/15/19 1139  BP: 112/64 (!) 126/55 135/79 (!) 152/65  Pulse: (!) 52 (!) 55 63 61  Resp: 18 16 16 17   Temp: 98.2 F (36.8 C) 98.1 F (36.7 C) 98.3 F (36.8 C) 98.1 F (36.7  C)  TempSrc: Oral Oral Oral Oral  SpO2: 98% 98% 98% 99%  Weight:  74.5 kg    Height:        General: Pt is alert, awake, not in acute distress Cardiovascular: RRR, S1/S2 +, no rubs, no gallops Respiratory: CTA bilaterally, no  wheezing, no rhonchi Abdominal: Soft, NT, ND, bowel sounds + Extremities: no edema, no cyanosis    The results of significant diagnostics from this hospitalization (including imaging, microbiology, ancillary and laboratory) are listed below for reference.     Microbiology: Recent Results (from the past 240 hour(s))  Respiratory Panel by RT PCR (Flu A&B, Covid) - Nasopharyngeal Swab     Status: None   Collection Time: 07/09/19 11:42 AM   Specimen: Nasopharyngeal Swab  Result Value Ref Range Status   SARS Coronavirus 2 by RT PCR NEGATIVE NEGATIVE Final    Comment: (NOTE) SARS-CoV-2 target nucleic acids are NOT DETECTED. The SARS-CoV-2 RNA is generally detectable in upper respiratoy specimens during the acute phase of infection. The lowest concentration of SARS-CoV-2 viral copies this assay can detect is 131 copies/mL. A negative result does not preclude SARS-Cov-2 infection and should not be used as the sole basis for treatment or other patient management decisions. A negative result may occur with  improper specimen collection/handling, submission of specimen other than nasopharyngeal swab, presence of viral mutation(s) within the areas targeted by this assay, and inadequate number of viral copies (<131 copies/mL). A negative result must be combined with clinical observations, patient history, and epidemiological information. The expected result is Negative. Fact Sheet for Patients:  PinkCheek.be Fact Sheet for Healthcare Providers:  GravelBags.it This test is not yet ap proved or cleared by the [place] FDA and  has been authorized for detection and/or diagnosis of SARS-CoV-2 by FDA under  an Emergency Use Authorization (EUA). This EUA will remain  in effect (meaning this test can be used) for the duration of the COVID-19 declaration under Section 564(b)(1) of the Act, 21 U.S.C. section 360bbb-3(b)(1), unless the authorization is terminated or revoked sooner.    Influenza A by PCR NEGATIVE NEGATIVE Final   Influenza B by PCR NEGATIVE NEGATIVE Final    Comment: (NOTE) The Xpert Xpress SARS-CoV-2/FLU/RSV assay is intended as an aid in  the diagnosis of influenza from Nasopharyngeal swab specimens and  should not be used as a sole basis for treatment. Nasal washings and  aspirates are unacceptable for Xpert Xpress SARS-CoV-2/FLU/RSV  testing. Fact Sheet for Patients: PinkCheek.be Fact Sheet for Healthcare Providers: GravelBags.it This test is not yet approved or cleared by the [place] FDA and  has been authorized for detection and/or diagnosis of SARS-CoV-2 by  FDA under an Emergency Use Authorization (EUA). This EUA will remain  in effect (meaning this test can be used) for the duration of the  Covid-19 declaration under Section 564(b)(1) of the Act, 21  U.S.C. section 360bbb-3(b)(1), unless the authorization is  terminated or revoked. Performed at [facility], [address] [place], [place] [address]   MRSA PCR Screening     Status: None   Collection Time: 07/09/19  3:29 PM   Specimen: Nasopharyngeal  Result Value Ref Range Status   MRSA by PCR NEGATIVE NEGATIVE Final    Comment:        The GeneXpert MRSA Assay (FDA approved for NASAL specimens only), is one component of a comprehensive MRSA colonization surveillance program. It is not intended to diagnose MRSA infection nor to guide or monitor treatment for MRSA infections. Performed at [facility], [address] [place], [place] [address]   CULTURE, BLOOD (ROUTINE X 2) w Reflex to ID Panel     Status: None    Collection Time: 07/09/19  7:00 PM   Specimen: BLOOD  Result Value Ref Range  Status   Specimen Description BLOOD LEFT HAND  Final   Special Requests   Final    BOTTLES DRAWN AEROBIC AND ANAEROBIC Blood Culture adequate volume   Culture   Final    NO GROWTH 5 DAYS Performed at [facility], [address] [place], [place] [address]    Report Status 07/14/2019 FINAL  Final  CULTURE, BLOOD (ROUTINE X 2) w Reflex to ID Panel     Status: None   Collection Time: 07/09/19  7:15 PM   Specimen: BLOOD  Result Value Ref Range Status   Specimen Description BLOOD LEFT ANTECUBITAL  Final   Special Requests   Final    BOTTLES DRAWN AEROBIC AND ANAEROBIC Blood Culture adequate volume   Culture   Final    NO GROWTH 5 DAYS Performed at [facility], [address] [place], [place] [address]    Report Status 07/14/2019 FINAL  Final     Labs: BNP (last 3 results) Recent Labs    07/09/19 1116 07/10/19 0512 07/11/19 0546  BNP 304.0* 537.0* 633.3*   Basic Metabolic Panel: Recent Labs  Lab 07/11/19 0546 07/12/19 0249 07/13/19 0627 07/14/19 0335 07/15/19 0531  NA 129* 130* 129* 133* 133*  K 3.4* 3.6 3.4* 4.0 4.1  CL 94* 96* 96* 99 103  CO2 21* 21* 20* 22 18*  GLUCOSE 101* 114* 101* 109* 103*  BUN 66* 75* 72* 74* 62*  CREATININE 3.01* 3.12* 3.33* 3.15* 2.60*  CALCIUM 8.0* 7.7* 8.2* 8.6* 8.8*  MG 2.2 2.0 2.2 2.5* 2.4  PHOS 5.1*  --   --   --   --    Liver Function Tests: Recent Labs  Lab 07/09/19 1057  AST 22  ALT 24  ALKPHOS 82  BILITOT 1.9*  PROT 7.8  ALBUMIN 4.1   No results for input(s): LIPASE, AMYLASE in the last 168 hours. No results for input(s): AMMONIA in the last 168 hours. CBC: Recent Labs  Lab 07/11/19 0546 07/12/19 0249 07/13/19 0627 07/14/19 0335 07/15/19 0531  WBC 7.6 7.1 5.8 5.9 6.9  HGB 11.6* 11.4* 11.1* 11.6* 11.7*  HCT 34.2* 33.2* 32.2* 34.6* 34.7*  MCV 91.4 90.0 88.7 91.3 89.9  PLT 187 199 202 248 236   Cardiac  Enzymes: No results for input(s): CKTOTAL, CKMB, CKMBINDEX, TROPONINI in the last 168 hours. BNP: Invalid input(s): POCBNP CBG: Recent Labs  Lab 07/09/19 1524  GLUCAP 141*   D-Dimer No results for input(s): DDIMER in the last 72 hours. Hgb A1c No results for input(s): HGBA1C in the last 72 hours. Lipid Profile No results for input(s): CHOL, HDL, LDLCALC, TRIG, CHOLHDL, LDLDIRECT in the last 72 hours. Thyroid function studies No results for input(s): TSH, T4TOTAL, T3FREE, THYROIDAB in the last 72 hours.  Invalid input(s): FREET3 Anemia work up No results for input(s): VITAMINB12, FOLATE, FERRITIN, TIBC, IRON, RETICCTPCT in the last 72 hours. Urinalysis    Component Value Date/Time   COLORURINE YELLOW (A) 07/13/2019 1406   APPEARANCEUR CLEAR (A) 07/13/2019 1406   LABSPEC 1.010 07/13/2019 1406   PHURINE 5.0 07/13/2019 1406   GLUCOSEU NEGATIVE 07/13/2019 1406   HGBUR NEGATIVE 07/13/2019 1406   [place] 07/13/2019 1406   [place] 07/13/2019 1406   PROTEINUR NEGATIVE 07/13/2019 1406   NITRITE NEGATIVE 07/13/2019 1406   LEUKOCYTESUR NEGATIVE 07/13/2019 1406   Sepsis Labs Invalid input(s): PROCALCITONIN,  WBC,  LACTICIDVEN Microbiology Recent Results (from the past 240 hour(s))  Respiratory Panel by RT PCR (Flu A&B, Covid) -  Nasopharyngeal Swab     Status: None   Collection Time: 07/09/19 11:42 AM   Specimen: Nasopharyngeal Swab  Result Value Ref Range Status   SARS Coronavirus 2 by RT PCR NEGATIVE NEGATIVE Final    Comment: (NOTE) SARS-CoV-2 target nucleic acids are NOT DETECTED. The SARS-CoV-2 RNA is generally detectable in upper respiratoy specimens during the acute phase of infection. The lowest concentration of SARS-CoV-2 viral copies this assay can detect is 131 copies/mL. A negative result does not preclude SARS-Cov-2 infection and should not be used as the sole basis for treatment or other patient management decisions. A negative result may  occur with  improper specimen collection/handling, submission of specimen other than nasopharyngeal swab, presence of viral mutation(s) within the areas targeted by this assay, and inadequate number of viral copies (<131 copies/mL). A negative result must be combined with clinical observations, patient history, and epidemiological information. The expected result is Negative. Fact Sheet for Patients:  PinkCheek.be Fact Sheet for Healthcare Providers:  GravelBags.it This test is not yet ap proved or cleared by the [place] FDA and  has been authorized for detection and/or diagnosis of SARS-CoV-2 by FDA under an Emergency Use Authorization (EUA). This EUA will remain  in effect (meaning this test can be used) for the duration of the COVID-19 declaration under Section 564(b)(1) of the Act, 21 U.S.C. section 360bbb-3(b)(1), unless the authorization is terminated or revoked sooner.    Influenza A by PCR NEGATIVE NEGATIVE Final   Influenza B by PCR NEGATIVE NEGATIVE Final    Comment: (NOTE) The Xpert Xpress SARS-CoV-2/FLU/RSV assay is intended as an aid in  the diagnosis of influenza from Nasopharyngeal swab specimens and  should not be used as a sole basis for treatment. Nasal washings and  aspirates are unacceptable for Xpert Xpress SARS-CoV-2/FLU/RSV  testing. Fact Sheet for Patients: PinkCheek.be Fact Sheet for Healthcare Providers: GravelBags.it This test is not yet approved or cleared by the [place] FDA and  has been authorized for detection and/or diagnosis of SARS-CoV-2 by  FDA under an Emergency Use Authorization (EUA). This EUA will remain  in effect (meaning this test can be used) for the duration of the  Covid-19 declaration under Section 564(b)(1) of the Act, 21  U.S.C. section 360bbb-3(b)(1), unless the authorization is  terminated or  revoked. Performed at [facility], [address] [place], [place] [address]   MRSA PCR Screening     Status: None   Collection Time: 07/09/19  3:29 PM   Specimen: Nasopharyngeal  Result Value Ref Range Status   MRSA by PCR NEGATIVE NEGATIVE Final    Comment:        The GeneXpert MRSA Assay (FDA approved for NASAL specimens only), is one component of a comprehensive MRSA colonization surveillance program. It is not intended to diagnose MRSA infection nor to guide or monitor treatment for MRSA infections. Performed at [facility], [address] [place], [place] [address]   CULTURE, BLOOD (ROUTINE X 2) w Reflex to ID Panel     Status: None   Collection Time: 07/09/19  7:00 PM   Specimen: BLOOD  Result Value Ref Range Status   Specimen Description BLOOD LEFT HAND  Final   Special Requests   Final    BOTTLES DRAWN AEROBIC AND ANAEROBIC Blood Culture adequate volume   Culture   Final    NO GROWTH 5 DAYS Performed at [facility], [address], [place], [place] [address]    Report Status  07/14/2019 FINAL  Final  CULTURE, BLOOD (ROUTINE X 2) w Reflex to ID Panel     Status: None   Collection Time: 07/09/19  7:15 PM   Specimen: BLOOD  Result Value Ref Range Status   Specimen Description BLOOD LEFT ANTECUBITAL  Final   Special Requests   Final    BOTTLES DRAWN AEROBIC AND ANAEROBIC Blood Culture adequate volume   Culture   Final    NO GROWTH 5 DAYS Performed at [facility], [address], [place], [place] [address]    Report Status 07/14/2019 FINAL  Final     Time coordinating discharge: Over 30 minutes  SIGNED:   Sidney Ace, MD  [facility] 07/15/2019, 2:57 PM Pager   If 7PM-7AM, please contact night-coverage

## 2019-07-15 NOTE — Progress Notes (Signed)
Subjective: Interval History: The patient feels well today and looks forward to returning home as soon as possible. Denies any shortness of breath, chest pain, or any other complaints. Reports he has noticed his urine becoming more clear as he has increased his fluid intake over the past few days.  Creatinine continues to improve, 2.6 today from 3.3 two days ago.  Objective: Vital signs in last 24 hours: Temp:  [98.1 F (36.7 C)-98.6 F (37 C)] 98.3 F (36.8 C) (02/12 0750) Pulse Rate:  [43-63] 63 (02/12 0750) Resp:  [16-19] 16 (02/12 0750) BP: (112-138)/(55-79) 135/79 (02/12 0750) SpO2:  [96 %-98 %] 98 % (02/12 0750) Weight:  [74.5 kg] 74.5 kg (02/12 0428) Weight change: -0.771 kg  Intake/Output from previous day: 02/11 0701 - 02/12 0700 In: 240 [P.O.:240] Out: 400 [Urine:400] Intake/Output this shift: No intake/output data recorded.  Physical Exam General: Alert, awake, resting comfortably in the bed. HEENT: Head: atraumatic, normocephalic. Eyes: conjunctivae clear. Respiratory: Respiratory effort normal. Lungs clear to auscultation bilaterally. Cardiac: Irregularly irregular rhythm. Radial pulses symmetrical. Abdomen: Active bowel sounds. Extremities: No edema. Skin: Warm, dry, no lesions. Neuro: Alert and oriented x4. Psych: Mood and affect normal.   Lab Results: Recent Labs    07/14/19 0335 07/15/19 0531  WBC 5.9 6.9  HGB 11.6* 11.7*  HCT 34.6* 34.7*  PLT 248 236   BMET:  Recent Labs    07/14/19 0335 07/15/19 0531  NA 133* 133*  K 4.0 4.1  CL 99 103  CO2 22 18*  GLUCOSE 109* 103*  BUN 74* 62*  CREATININE 3.15* 2.60*  CALCIUM 8.6* 8.8*   No results for input(s): PTH in the last 72 hours. Iron Studies: No results for input(s): IRON, TIBC, TRANSFERRIN, FERRITIN in the last 72 hours.  Studies/Results: No results found.     Current Facility-Administered Medications (Cardiovascular):  .  amiodarone (PACERONE) tablet 200 mg .  atorvastatin  (LIPITOR) tablet 80 mg .  nitroGLYCERIN (NITROSTAT) SL tablet 0.4 mg .  omega-3 acid ethyl esters (LOVAZA) capsule 1 g     Current Facility-Administered Medications (Analgesics):  .  acetaminophen (TYLENOL) tablet 650 mg .  aspirin EC tablet 81 mg   Current Facility-Administered Medications (Hematological):  .  apixaban (ELIQUIS) tablet 2.5 mg   Current Facility-Administered Medications (Other):  .  chlorhexidine (PERIDEX) 0.12 % solution 15 mL .  Chlorhexidine Gluconate Cloth 2 % PADS 6 each .  dorzolamide-timolol (COSOPT) 22.3-6.8 MG/ML ophthalmic solution 1 drop .  latanoprost (XALATAN) 0.005 % ophthalmic solution 1 drop .  MEDLINE mouth rinse .  Melatonin TABS 5 mg .  multivitamin-lutein (OCUVITE-LUTEIN) capsule 1 capsule .  ondansetron (ZOFRAN) injection 4 mg .  polyethylene glycol (MIRALAX / GLYCOLAX) packet 17 g .  polyvinyl alcohol (LIQUIFILM TEARS) 1.4 % ophthalmic solution .  senna-docusate (Senokot-S) tablet 1 tablet .  tamsulosin (FLOMAX) capsule 0.4 mg  No current outpatient medications on file.   Assessment/Plan: Pt is a44 y.o.malewith PMHx of CAD s/p CABG x3with unstable angina,hypertension, hyperlipidemia, atrial fibrillation,carotid artery stenosis, mild aortic stenosis,peripheral vascular disease, CKD stage IIBwas admitted on 2/6/2021with NSTEMI.Hospital course was complicated by worsening chest pain and hypoxemia soon after admission that required ICU monitoring.Nephrology is consulted for AKI. Lasix vs renal hypoperfusion is thought to be a contributing factor and the diuretic was discontinued after administration of 4 doses on 07/10/19 and 1 dose on 07/11/19.  #Acute kidney injury on CKD stage IIIB - Creatinine trending down. 3.33-->3.15-->2.60. Baseline creatinine ~1.6/GFR 37 on 11/23/2018. -  Urinalysis negative: no proteinuria or muddy brown casts. - Renal US revealed moderate bilateral renal cortical atrophy, no hydronephrosis.This is  consistent with diagnosis of CKD. - Agree with holding Lasix, valsartan, any nephrotoxic medications due to AKI on CKD stage IIIB. - Blood pressure has been stable throughout this hospitalization. - Plan for discharge today. Patient will follow up outpatient with cardiology (Dr. Nehemiah Massed) and nephrology.   LOS: 6 days   Maryln Gottron 07/15/2019,10:08 AM

## 2019-07-15 NOTE — Care Management Important Message (Signed)
Important Message  Patient Details  Name: Austin [name] MRN: [id number] Date of Birth: 05/14/1929   Medicare Important Message Given:  Yes     Dannette Barbara 07/15/2019, 1:38 PM

## 2019-07-15 NOTE — Progress Notes (Signed)
Meds to Beds Update:  Pt Medications Eliquis and Amiodarone filled by employee pharmacy and delivered to pt.

## 2019-07-18 ENCOUNTER — Telehealth: Payer: Self-pay

## 2019-07-18 NOTE — Telephone Encounter (Signed)
Transition Care Management Follow-up Telephone Call  Date of discharge and from where: [facility] on 07/15/19  How have you been since you were released from the hospital? Doing well, is still weak but is back to sleeping good and eating normal. Declines pain, fever, SOB or n/v/d.  Any questions or concerns? No   Items Reviewed:  Did the pt receive and understand the discharge instructions provided? Yes   Medications obtained and verified? No, declined reviewing all meds until next apt. Did verify new and cancelled medications.  Any new allergies since your discharge? No   Dietary orders reviewed? Yes  Do you have support at home? Yes   Other (ie: DME, Home Health, etc) N/A  Functional Questionnaire: (I = Independent and D = Dependent)  Bathing/Dressing- I   Meal Prep- I  Eating- I  Maintaining continence- I  Transferring/Ambulation- I, uses a cane when goes outside of home as a precautionary.  Managing Meds- I   Follow up appointments reviewed:    PCP Hospital f/u appt confirmed? Yes  Scheduled to see Dr Rosanna Randy on 07/25/19 @ 9:40 AM.  [facility] f/u appt confirmed? Yes    Are transportation arrangements needed? No   If their condition worsens, is the pt aware to call  their PCP or go to the ED? Yes  Was the patient provided with contact information for the PCP's office or ED? Yes  Was the pt encouraged to call back with questions or concerns? Yes

## 2019-07-18 NOTE — Telephone Encounter (Signed)
No HFU is scheduled, however a 4 month f/u is scheduled 07/25/19 @ 9:40 AM.

## 2019-07-18 NOTE — Telephone Encounter (Signed)
That's fine

## 2019-07-19 DIAGNOSIS — N179 Acute kidney failure, unspecified: Secondary | ICD-10-CM | POA: Diagnosis not present

## 2019-07-19 DIAGNOSIS — N1832 Chronic kidney disease, stage 3b: Secondary | ICD-10-CM | POA: Diagnosis not present

## 2019-07-19 DIAGNOSIS — I10 Essential (primary) hypertension: Secondary | ICD-10-CM | POA: Diagnosis not present

## 2019-07-20 NOTE — Progress Notes (Signed)
Patient: Austin [name] Male    DOB: December 17, 1928   86 y.o.   MRN: [id number] Visit Date: 07/25/2019  Today's Provider: Wilhemena Durie, MD   Chief Complaint  Patient presents with  . Follow-up  . Coronary Artery Disease  . Hypertension  . Hyperlipidemia   Subjective:     HPI  This is transition of care visit for NSTEMI and CAD with maximal medical management.  He has chronic CKD now followed by nephrology.  He is having no further chest tightness is slowly getting his energy back.  He has mild dyspnea with exertion but this is stable and not worsening. Coronary artery disease involving native coronary artery of native heart with unstable angina pectoris ([place]) From 03/28/2019-All risk factors treated.  Benign essential HTN From 11/23/2018-Blood pressures at home running 130 range over 60s.  Osteoarthritis of knee (Right) From 11/23/2018-Status post partial replacement  Borderline diabetes From 11/23/2018-Labs checked showing-stable.   Borderline anemia From 11/23/2018-Labs checked showing-stable.   Hyperlipidemia, unspecified hyperlipidemia type From 11/23/2018-On lipitor. Labs checked showing-stable.    Allergies  Allergen Reactions  . Penicillins Itching, Swelling, Rash and Other (See Comments)    Has patient had a PCN reaction causing immediate rash, facial/tongue/throat swelling, SOB or lightheadedness with hypotension: Yes Has patient had a PCN reaction causing severe rash involving mucus membranes or skin necrosis: No Has patient had a PCN reaction that required hospitalization No Has patient had a PCN reaction occurring within the last 10 years: No If all of the above answers are "NO", then may proceed with Cephalosporin use.      Current Outpatient Medications:  .  amiodarone (PACERONE) 200 MG tablet, Take 1 tablet (200 mg total) by mouth daily., Disp: 30 tablet, Rfl: 0 .  amLODipine (NORVASC) 5 MG tablet, Take 5 mg by mouth daily., Disp: , Rfl:    .  apixaban (ELIQUIS) 2.5 MG TABS tablet, Take 1 tablet (2.5 mg total) by mouth 2 (two) times daily., Disp: 60 tablet, Rfl: 0 .  aspirin EC 81 MG EC tablet, Take 1 tablet (81 mg total) by mouth daily., Disp: 90 tablet, Rfl: 0 .  atorvastatin (LIPITOR) 20 MG tablet, Take 20 mg by mouth daily. , Disp: , Rfl:  .  Biotin 5000 MCG CAPS, Take 5,000 mcg by mouth daily. , Disp: , Rfl:  .  Cholecalciferol (VITAMIN D3) 2000 UNITS capsule, Take 2,000 Units by mouth 2 (two) times daily. , Disp: , Rfl:  .  Coenzyme Q10 (COQ-10) 200 MG CAPS, Take 200 mg by mouth daily., Disp: , Rfl:  .  dorzolamide-timolol (COSOPT) 22.3-6.8 MG/ML ophthalmic solution, Place 1 drop into both eyes 2 (two) times daily. , Disp: , Rfl:  .  Glucosamine HCl (GLUCOSAMINE PO), Take 1 tablet by mouth 2 (two) times daily., Disp: , Rfl:  .  isosorbide mononitrate (IMDUR) 60 MG 24 hr tablet, Take 60 mg by mouth daily. , Disp: , Rfl:  .  Liniments (SALONPAS PAIN RELIEF PATCH EX), Apply 1 patch topically daily as needed (for pain)., Disp: , Rfl:  .  Melatonin 5 MG CAPS, Take 5 mg by mouth at bedtime as needed (for sleep). , Disp: , Rfl:  .  Multiple Vitamins-Minerals (PRESERVISION AREDS 2) CAPS, Take 1 capsule by mouth 2 (two) times daily., Disp: , Rfl:  .  nitroGLYCERIN (NITROSTAT) 0.4 MG SL tablet, Place 0.4 mg under the tongue every 5 (five) minutes as needed for chest pain. , Disp: ,  Rfl:  .  Omega-3 Fatty Acids (FISH OIL) 1200 MG CAPS, Take 1,200 mg by mouth daily. , Disp: , Rfl:  .  omeprazole (PRILOSEC) 20 MG capsule, Take 20 mg by mouth daily. , Disp: , Rfl:  .  Polyethyl Glycol-Propyl Glycol (SYSTANE OP), Place 1 drop into the right eye daily as needed (for blurriness). , Disp: , Rfl:  .  tamsulosin (FLOMAX) 0.4 MG CAPS capsule, TAKE 1 CAPSULE BY MOUTH EVERY DAY (Patient taking differently: Take 0.4 mg by mouth daily. ), Disp: 30 capsule, Rfl: 11 .  Travoprost, BAK Free, (TRAVATAN Z) 0.004 % SOLN ophthalmic solution, Place 1 drop into  both eyes at bedtime. , Disp: , Rfl:  .  Turmeric 500 MG TABS, Take 1,000 mg by mouth daily., Disp: , Rfl:  .  vitamin B-12 (CYANOCOBALAMIN) 1000 MCG tablet, Take 1,000 mcg by mouth daily., Disp: , Rfl:   Review of Systems  Constitutional: Negative for appetite change, chills and fever.  Eyes: Negative.   Respiratory: Negative for chest tightness, shortness of breath and wheezing.   Cardiovascular: Negative for chest pain and palpitations.  Gastrointestinal: Negative for abdominal pain, nausea and vomiting.  Endocrine: Negative.   Allergic/Immunologic: Negative.   Neurological: Negative.   Hematological: Negative.   Psychiatric/Behavioral: Negative.     Social History   Tobacco Use  . Smoking status: Former Smoker    Years: 15.00    Types: Cigarettes    Quit date: 1990    Years since quitting: 31.1  . Smokeless tobacco: Never Used  Substance Use Topics  . Alcohol use: Yes    Alcohol/week: 7.0 - 14.0 standard drinks    Types: 7 - 14 Glasses of wine per week    Comment: 1-2 glasses of wine a night      Objective:   BP (!) 129/55 (BP Location: Right Arm, Patient Position: Sitting, Cuff Size: Large)   Pulse (!) 56   Temp (!) 97.5 F (36.4 C) (Other (Comment))   Resp 18   Ht 5\' 6"  (1.676 m)   Wt 174 lb (78.9 kg)   SpO2 98%   BMI 28.08 kg/m  Vitals:   07/25/19 1000  BP: (!) 129/55  Pulse: (!) 56  Resp: 18  Temp: (!) 97.5 F (36.4 C)  TempSrc: Other (Comment)  SpO2: 98%  Weight: 174 lb (78.9 kg)  Height: 5\' 6"  (1.676 m)  Body mass index is 28.08 kg/m.   Physical Exam Vitals reviewed.  Constitutional:      Appearance: He is well-developed.  HENT:     Head: Normocephalic and atraumatic.     Right Ear: External ear normal.     Left Ear: External ear normal.     Nose: Nose normal.  Eyes:     General: No scleral icterus. Neck:     Thyroid: No thyromegaly.  Cardiovascular:     Rate and Rhythm: Normal rate and regular rhythm.     Heart sounds: Normal heart  sounds.  Pulmonary:     Effort: Pulmonary effort is normal.     Breath sounds: Normal breath sounds.  Abdominal:     Palpations: Abdomen is soft.  Skin:    General: Skin is warm and dry.  Neurological:     Mental Status: He is alert and oriented to person, place, and time.  Psychiatric:        Behavior: Behavior normal.        Thought Content: Thought content normal.  Judgment: Judgment normal.      No results found for any visits on 07/25/19.     Assessment & Plan     1. Coronary artery disease involving native coronary artery of native heart with unstable angina pectoris ([place]) Maximal medical therapy.  Patient is status post CABG more than a decade ago.  Clinically he is improving.  2. Benign essential HTN Good control.  Watch for symptomatic hypotension.  Push fluids.  3. NSTEMI (non-ST elevated myocardial infarction) ([place]) All risk factors treated.  4. Chronic atrial fibrillation (HCC) On amiodarone and Eliquis  5. Cardiomyopathy, ischemic   6. Obstructive apnea   7. Chronic Lumbar radiculitis (intermittent/recurrent) (Bilateral)   8. Neurogenic claudication   9. Hyperlipidemia, unspecified hyperlipidemia type On atorvastatin  10. Borderline diabetes Follow routine labs.     Follow up in 1-2 months.     I,Courtland Reas,acting as a scribe for Wilhemena Durie, MD.,have documented all relevant documentation on the behalf of Wilhemena Durie, MD,as directed by  Wilhemena Durie, MD while in the presence of Wilhemena Durie, MD.      Wilhemena Durie, MD  [facility]

## 2019-07-25 ENCOUNTER — Encounter: Payer: Self-pay | Admitting: Family Medicine

## 2019-07-25 ENCOUNTER — Other Ambulatory Visit: Payer: Self-pay

## 2019-07-25 ENCOUNTER — Ambulatory Visit (INDEPENDENT_AMBULATORY_CARE_PROVIDER_SITE_OTHER): Payer: PPO | Admitting: Family Medicine

## 2019-07-25 VITALS — BP 129/55 | HR 56 | Temp 97.5°F | Resp 18 | Ht 66.0 in | Wt 174.0 lb

## 2019-07-25 DIAGNOSIS — I255 Ischemic cardiomyopathy: Secondary | ICD-10-CM | POA: Diagnosis not present

## 2019-07-25 DIAGNOSIS — I10 Essential (primary) hypertension: Secondary | ICD-10-CM | POA: Diagnosis not present

## 2019-07-25 DIAGNOSIS — I25110 Atherosclerotic heart disease of native coronary artery with unstable angina pectoris: Secondary | ICD-10-CM | POA: Diagnosis not present

## 2019-07-25 DIAGNOSIS — G9519 Other vascular myelopathies: Secondary | ICD-10-CM

## 2019-07-25 DIAGNOSIS — R29818 Other symptoms and signs involving the nervous system: Secondary | ICD-10-CM

## 2019-07-25 DIAGNOSIS — I214 Non-ST elevation (NSTEMI) myocardial infarction: Secondary | ICD-10-CM | POA: Diagnosis not present

## 2019-07-25 DIAGNOSIS — M48062 Spinal stenosis, lumbar region with neurogenic claudication: Secondary | ICD-10-CM | POA: Diagnosis not present

## 2019-07-25 DIAGNOSIS — M5416 Radiculopathy, lumbar region: Secondary | ICD-10-CM | POA: Diagnosis not present

## 2019-07-25 DIAGNOSIS — R7303 Prediabetes: Secondary | ICD-10-CM

## 2019-07-25 DIAGNOSIS — E785 Hyperlipidemia, unspecified: Secondary | ICD-10-CM | POA: Diagnosis not present

## 2019-07-25 DIAGNOSIS — G4733 Obstructive sleep apnea (adult) (pediatric): Secondary | ICD-10-CM

## 2019-07-25 DIAGNOSIS — I482 Chronic atrial fibrillation, unspecified: Secondary | ICD-10-CM

## 2019-07-27 DIAGNOSIS — I252 Old myocardial infarction: Secondary | ICD-10-CM | POA: Diagnosis not present

## 2019-07-27 DIAGNOSIS — I25810 Atherosclerosis of coronary artery bypass graft(s) without angina pectoris: Secondary | ICD-10-CM | POA: Diagnosis not present

## 2019-07-27 DIAGNOSIS — I4891 Unspecified atrial fibrillation: Secondary | ICD-10-CM | POA: Diagnosis not present

## 2019-07-27 DIAGNOSIS — I5032 Chronic diastolic (congestive) heart failure: Secondary | ICD-10-CM | POA: Diagnosis not present

## 2019-07-27 DIAGNOSIS — I10 Essential (primary) hypertension: Secondary | ICD-10-CM | POA: Diagnosis not present

## 2019-07-27 DIAGNOSIS — R000 Tachycardia, unspecified: Secondary | ICD-10-CM | POA: Diagnosis not present

## 2019-07-27 DIAGNOSIS — I480 Paroxysmal atrial fibrillation: Secondary | ICD-10-CM | POA: Diagnosis not present

## 2019-07-28 ENCOUNTER — Ambulatory Visit: Payer: PPO | Attending: Internal Medicine

## 2019-07-28 DIAGNOSIS — Z23 Encounter for immunization: Secondary | ICD-10-CM | POA: Insufficient documentation

## 2019-07-28 NOTE — Progress Notes (Signed)
   Covid-19 Vaccination Clinic  Name:  Austin [name]    MRN: [id number] DOB: 08-08-1928  07/28/2019  Mr. Wessinger was observed post Covid-19 immunization for 30 minutes based on pre-vaccination screening without incidence. He was provided with Vaccine Information Sheet and instruction to access the V-Safe system.   Mr. Behar was instructed to call 911 with any severe reactions post vaccine: Marland Kitchen Difficulty breathing  . Swelling of your face and throat  . A fast heartbeat  . A bad rash all over your body  . Dizziness and weakness    Immunizations Administered    Name Date Dose VIS Date Route   Pfizer COVID-19 Vaccine 07/28/2019 10:18 AM 0.3 mL 05/13/2019 Intramuscular   Manufacturer: [place]   Lot: [id number]   [place]: [id number]

## 2019-07-29 DIAGNOSIS — I480 Paroxysmal atrial fibrillation: Secondary | ICD-10-CM | POA: Insufficient documentation

## 2019-08-02 DIAGNOSIS — H353132 Nonexudative age-related macular degeneration, bilateral, intermediate dry stage: Secondary | ICD-10-CM | POA: Diagnosis not present

## 2019-08-03 DIAGNOSIS — I25708 Atherosclerosis of coronary artery bypass graft(s), unspecified, with other forms of angina pectoris: Secondary | ICD-10-CM | POA: Diagnosis not present

## 2019-08-03 DIAGNOSIS — I255 Ischemic cardiomyopathy: Secondary | ICD-10-CM | POA: Diagnosis not present

## 2019-08-03 DIAGNOSIS — I10 Essential (primary) hypertension: Secondary | ICD-10-CM | POA: Diagnosis not present

## 2019-08-03 DIAGNOSIS — I480 Paroxysmal atrial fibrillation: Secondary | ICD-10-CM | POA: Diagnosis not present

## 2019-08-10 ENCOUNTER — Ambulatory Visit: Payer: Self-pay

## 2019-08-10 NOTE — Telephone Encounter (Signed)
Incoming call from Patient with a complaint of chest Pain. Especially at night when laying down reviewed protocol with Patient.  States that he has already seen his cardiologist and Kidney Dr.  Has a history of minor Heart attack and Kidney  Issues.  Patient was advised to go to Urgent care for mild to moderate  SOB.  Declined really wishes to talk with Dr. Rosanna Randy.  Pain maily occrs at night when Laying down.  Patient awaits return call from Dr.  Rosanna Randy.     Reason for Disposition . Difficulty breathing  Answer Assessment - Initial Assessment Questions 1. LOCATION: "Where does it hurt?"     middle 2. RADIATION: "Does the pain go anywhere else?" (e.g., into neck, jaw, arms, back)     Denies 3. ONSET: "When did the chest pain begin?" (Minutes, hours or days)      4. PATTERN "Does the pain come and go, or has it been constant since it started?"  "Does it get worse with exertion?"     constant 5. DURATION: "How long does it last" (e.g., seconds, minutes, hours)     *No Answer* 6. SEVERITY: "How bad is the pain?"  (e.g., Scale 1-10; mild, moderate, or severe)    - MILD (1-3): doesn't interfere with normal activities     - MODERATE (4-7): interferes with normal activities or awakens from sleep    - SEVERE (8-10): excruciating pain, unable to do any normal activities        Mild to [facility] RISK FACTORS: "Do you have any history of heart problems or risk factors for heart disease?" (e.g., angina, prior heart attack; diabetes, high blood pressure, high cholesterol, smoker, or strong family history of heart disease)     *No Answer* 8. PULMONARY RISK FACTORS: "Do you have any history of lung disease?"  (e.g., blood clots in lung, asthma, emphysema, birth control pills)     Years ago 56. CAUSE: "What do you think is causing the chest pain?"     *No Answer* 10. OTHER SYMPTOMS: "Do you have any other symptoms?" (e.g., dizziness, nausea, vomiting, sweating, fever, difficulty breathing, cough)    nausea 11. PREGNANCY: "Is there any chance you are pregnant?" "When was your last menstrual period?"      Na  Protocols used: CHEST PAIN-A-AH

## 2019-08-11 DIAGNOSIS — I10 Essential (primary) hypertension: Secondary | ICD-10-CM | POA: Diagnosis not present

## 2019-08-11 DIAGNOSIS — R600 Localized edema: Secondary | ICD-10-CM | POA: Diagnosis not present

## 2019-08-11 DIAGNOSIS — N1832 Chronic kidney disease, stage 3b: Secondary | ICD-10-CM | POA: Diagnosis not present

## 2019-08-23 ENCOUNTER — Ambulatory Visit: Payer: PPO

## 2019-08-23 DIAGNOSIS — I480 Paroxysmal atrial fibrillation: Secondary | ICD-10-CM | POA: Diagnosis not present

## 2019-08-23 DIAGNOSIS — I25708 Atherosclerosis of coronary artery bypass graft(s), unspecified, with other forms of angina pectoris: Secondary | ICD-10-CM | POA: Diagnosis not present

## 2019-08-25 DIAGNOSIS — I10 Essential (primary) hypertension: Secondary | ICD-10-CM | POA: Diagnosis not present

## 2019-08-25 DIAGNOSIS — E782 Mixed hyperlipidemia: Secondary | ICD-10-CM | POA: Diagnosis not present

## 2019-08-25 DIAGNOSIS — I25810 Atherosclerosis of coronary artery bypass graft(s) without angina pectoris: Secondary | ICD-10-CM | POA: Diagnosis not present

## 2019-08-30 ENCOUNTER — Ambulatory Visit: Payer: PPO | Attending: Internal Medicine

## 2019-08-30 DIAGNOSIS — Z23 Encounter for immunization: Secondary | ICD-10-CM

## 2019-08-30 NOTE — Progress Notes (Signed)
   Covid-19 Vaccination Clinic  Name:  Austin [name]    MRN: [id number] DOB: 1929/01/12  08/30/2019  Austin [name] was observed post Covid-19 immunization for 15 minutes without incident. He was provided with Vaccine Information Sheet and instruction to access the V-Safe system.   Austin [name] was instructed to call 911 with any severe reactions post vaccine: Marland Kitchen Difficulty breathing  . Swelling of face and throat  . A fast heartbeat  . A bad rash all over body  . Dizziness and weakness   Immunizations Administered    Name Date Dose VIS Date Route   Pfizer COVID-19 Vaccine 08/30/2019  2:20 PM 0.3 mL 05/13/2019 Intramuscular   Manufacturer: [place]   Lot: [id number]   [place]: [id number]

## 2019-09-19 ENCOUNTER — Telehealth: Payer: Self-pay

## 2019-09-19 NOTE — Telephone Encounter (Signed)
Copied from [place] [id number]. Topic: General - Call Back - No Documentation >> Sep 19, 2019  4:32 PM Erick Blinks wrote: Reason for CRM: Pt has questions about his cholesterol test, please advise Best contact: [phone]

## 2019-09-20 DIAGNOSIS — E782 Mixed hyperlipidemia: Secondary | ICD-10-CM | POA: Diagnosis not present

## 2019-09-20 NOTE — Telephone Encounter (Signed)
Returned call to patient.

## 2019-09-20 NOTE — Telephone Encounter (Signed)
NA no voicemail 

## 2019-09-27 DIAGNOSIS — I10 Essential (primary) hypertension: Secondary | ICD-10-CM | POA: Diagnosis not present

## 2019-09-27 DIAGNOSIS — N1832 Chronic kidney disease, stage 3b: Secondary | ICD-10-CM | POA: Diagnosis not present

## 2019-09-27 DIAGNOSIS — R600 Localized edema: Secondary | ICD-10-CM | POA: Diagnosis not present

## 2019-09-30 NOTE — Progress Notes (Signed)
Established patient visit  I,Austin [name],acting as a scribe for Wilhemena Durie, MD.,have documented all relevant documentation on the behalf of Wilhemena Durie, MD,as directed by  Wilhemena Durie, MD while in the presence of Wilhemena Durie, MD.   Patient: Austin [name]   DOB: August 26, 1928   86 y.o. Male  MRN: [id number] Visit Date: 10/05/2019  Today's healthcare provider: Wilhemena Durie, MD   No chief complaint on file.  Subjective    HPI  Patient presents for follow-up.  Overall he is feeling fairly well.  He is getting weaker with time.  Less energy overall.  No progressive chest pain shortness of breath DOE PND orthopnea. Prediabetes, Follow-up  Lab Results  Component Value Date   HGBA1C 5.7 09/29/2016   HGBA1C 5.4 02/27/2016   HGBA1C 6.2 08/27/2015   GLUCOSE 103 (H) 07/15/2019   GLUCOSE 109 (H) 07/14/2019   GLUCOSE 101 (H) 07/13/2019    Last seen for for this 2 months ago.  Management since that visit includes following routine labs. Current symptoms include none and have been unchanged.  Prior visit with dietician: no Current diet: well balanced Current exercise: none  Pertinent Labs:    Component Value Date/Time   CHOL 134 11/23/2018 0915   TRIG 78 11/23/2018 0915   CHOLHDL 2.7 11/23/2018 0915   CHOLHDL 3.6 03/05/2017 1036   CREATININE 2.60 (H) 07/15/2019 0531   CREATININE 1.50 (H) 03/05/2017 1036    Wt Readings from Last 3 Encounters:  07/25/19 174 lb (78.9 kg)  07/15/19 164 lb 3.2 oz (74.5 kg)  03/28/19 170 lb (77.1 kg)    --------------------------------------------------------------------  Past Medical History:  Diagnosis Date  . Absolute anemia 12/08/2014  . Allergy   . Anginal pain ([place])    much less angina d/t lack of activity  . Arthritis    all over body  . Bradycardia 05/03/2015  . Calculus of kidney 12/08/2014  . Coronary artery disease   . GERD (gastroesophageal reflux disease)   . Glaucoma   . History of kidney  stones   . HLD (hyperlipidemia)   . Hyperglycemia 08/27/2015  . Hypertension   . Myocardial infarction ([place]) 2011  . Peripheral vascular disease ([place])   . VHD (valvular heart disease)        Medications: Outpatient Medications Prior to Visit  Medication Sig  . amiodarone (PACERONE) 200 MG tablet Take 1 tablet (200 mg total) by mouth daily.  Marland Kitchen amLODipine (NORVASC) 5 MG tablet Take 5 mg by mouth daily.  Marland Kitchen apixaban (ELIQUIS) 2.5 MG TABS tablet Take 1 tablet (2.5 mg total) by mouth 2 (two) times daily.  Marland Kitchen aspirin EC 81 MG EC tablet Take 1 tablet (81 mg total) by mouth daily.  Marland Kitchen atorvastatin (LIPITOR) 20 MG tablet Take 20 mg by mouth daily.   . Biotin 5000 MCG CAPS Take 5,000 mcg by mouth daily.   . Cholecalciferol (VITAMIN D3) 2000 UNITS capsule Take 2,000 Units by mouth 2 (two) times daily.   . Coenzyme Q10 (COQ-10) 200 MG CAPS Take 200 mg by mouth daily.  . dorzolamide-timolol (COSOPT) 22.3-6.8 MG/ML ophthalmic solution Place 1 drop into both eyes 2 (two) times daily.   . Glucosamine HCl (GLUCOSAMINE PO) Take 1 tablet by mouth 2 (two) times daily.  . isosorbide mononitrate (IMDUR) 60 MG 24 hr tablet Take 60 mg by mouth daily.   . Liniments (SALONPAS PAIN RELIEF PATCH EX) Apply 1 patch topically daily as needed (for pain).  Marland Kitchen  Melatonin 5 MG CAPS Take 5 mg by mouth at bedtime as needed (for sleep).   . Multiple Vitamins-Minerals (PRESERVISION AREDS 2) CAPS Take 1 capsule by mouth 2 (two) times daily.  . nitroGLYCERIN (NITROSTAT) 0.4 MG SL tablet Place 0.4 mg under the tongue every 5 (five) minutes as needed for chest pain.   . Omega-3 Fatty Acids (FISH OIL) 1200 MG CAPS Take 1,200 mg by mouth daily.   Marland Kitchen omeprazole (PRILOSEC) 20 MG capsule Take 20 mg by mouth daily.   Vladimir Faster Glycol-Propyl Glycol (SYSTANE OP) Place 1 drop into the right eye daily as needed (for blurriness).   . tamsulosin (FLOMAX) 0.4 MG CAPS capsule TAKE 1 CAPSULE BY MOUTH EVERY DAY (Patient taking differently: Take 0.4  mg by mouth daily. )  . Travoprost, BAK Free, (TRAVATAN Z) 0.004 % SOLN ophthalmic solution Place 1 drop into both eyes at bedtime.   . Turmeric 500 MG TABS Take 1,000 mg by mouth daily.  . vitamin B-12 (CYANOCOBALAMIN) 1000 MCG tablet Take 1,000 mcg by mouth daily.   No facility-administered medications prior to visit.    Review of Systems  Constitutional: Positive for fatigue. Negative for appetite change, chills and fever.  HENT: Negative.   Eyes: Negative.   Respiratory: Negative for chest tightness, shortness of breath and wheezing.   Cardiovascular: Negative for chest pain and palpitations.  Gastrointestinal: Negative for abdominal pain, nausea and vomiting.  Endocrine: Negative.   Musculoskeletal: Positive for back pain.  Allergic/Immunologic: Negative.   Hematological: Negative.   Psychiatric/Behavioral: Negative.     Last metabolic panel Lab Results  Component Value Date   GLUCOSE 103 (H) 07/15/2019   NA 133 (L) 07/15/2019   K 4.1 07/15/2019   CL 103 07/15/2019   CO2 18 (L) 07/15/2019   BUN 62 (H) 07/15/2019   CREATININE 2.60 (H) 07/15/2019   GFRNONAA 21 (L) 07/15/2019   GFRAA 24 (L) 07/15/2019   CALCIUM 8.8 (L) 07/15/2019   PHOS 5.1 (H) 07/11/2019   PROT 7.8 07/09/2019   ALBUMIN 4.1 07/09/2019   LABGLOB 2.3 11/23/2018   AGRATIO 2.0 11/23/2018   BILITOT 1.9 (H) 07/09/2019   ALKPHOS 82 07/09/2019   AST 22 07/09/2019   ALT 24 07/09/2019   ANIONGAP 12 07/15/2019      Objective    There were no vitals taken for this visit. BP Readings from Last 3 Encounters:  10/05/19 (!) 142/55  07/25/19 (!) 129/55  07/15/19 (!) 152/65   Wt Readings from Last 3 Encounters:  10/05/19 166 lb (75.3 kg)  07/25/19 174 lb (78.9 kg)  07/15/19 164 lb 3.2 oz (74.5 kg)      Physical Exam Vitals reviewed.  Constitutional:      Appearance: He is well-developed.  HENT:     Head: Normocephalic and atraumatic.     Right Ear: External ear normal.     Left Ear: External ear  normal.     Nose: Nose normal.  Eyes:     General: No scleral icterus. Neck:     Thyroid: No thyromegaly.  Cardiovascular:     Rate and Rhythm: Regular rhythm. Bradycardia present.     Heart sounds: Normal heart sounds.  Pulmonary:     Effort: Pulmonary effort is normal.     Breath sounds: Normal breath sounds.  Abdominal:     Palpations: Abdomen is soft.  Skin:    General: Skin is warm and dry.  Neurological:     Mental Status: He is alert and  oriented to person, place, and time.  Psychiatric:        Behavior: Behavior normal.        Thought Content: Thought content normal.        Judgment: Judgment normal.   ECG reveals sinus bradycardia with a rate of about 48.  Nonspecific ST-T wave changes.   No results found for any visits on 10/05/19.  Assessment & Plan     1. Coronary artery disease involving native coronary artery of native heart with unstable angina pectoris ([place]) Clinically stable at this time. - EKG 12-Lead  2. Cardiomyopathy, ischemic All risk factors treated   3. Chronic atrial fibrillation (HCC) On amiodarone and Eliquis  4. Benign essential HTN/CKD Controlled on amlodipine  5. Chronic Lumbar radiculitis (intermittent/recurrent) (Bilateral)   6. Status post right partial knee replacement   7. Hyperlipidemia, unspecified hyperlipidemia type   8. Borderline diabetes Completely stable.  Follow routine sugars 9.  Bradycardia We will have him see cardiology sooner rather than this late summer appointment.  It is possible that this bradycardia is significant enough that 86 years old to cause him problems.  I think is reasonable to at least talk about what options he has. No follow-ups on file.      I, Wilhemena Durie, MD, have reviewed all documentation for this visit. The documentation on 10/07/19 for the exam, diagnosis, procedures, and orders are all accurate and complete.    Juanisha Bautch Cranford Mon, MD  [facility] [phone] (phone) [phone] (fax)  [place]

## 2019-10-05 ENCOUNTER — Other Ambulatory Visit: Payer: Self-pay

## 2019-10-05 ENCOUNTER — Ambulatory Visit (INDEPENDENT_AMBULATORY_CARE_PROVIDER_SITE_OTHER): Payer: PPO | Admitting: Family Medicine

## 2019-10-05 ENCOUNTER — Encounter: Payer: Self-pay | Admitting: Family Medicine

## 2019-10-05 VITALS — BP 142/55 | HR 54 | Temp 97.5°F | Resp 18 | Ht 66.0 in | Wt 166.0 lb

## 2019-10-05 DIAGNOSIS — R7303 Prediabetes: Secondary | ICD-10-CM

## 2019-10-05 DIAGNOSIS — I25110 Atherosclerotic heart disease of native coronary artery with unstable angina pectoris: Secondary | ICD-10-CM

## 2019-10-05 DIAGNOSIS — M5416 Radiculopathy, lumbar region: Secondary | ICD-10-CM | POA: Diagnosis not present

## 2019-10-05 DIAGNOSIS — I10 Essential (primary) hypertension: Secondary | ICD-10-CM | POA: Diagnosis not present

## 2019-10-05 DIAGNOSIS — R001 Bradycardia, unspecified: Secondary | ICD-10-CM | POA: Diagnosis not present

## 2019-10-05 DIAGNOSIS — I255 Ischemic cardiomyopathy: Secondary | ICD-10-CM

## 2019-10-05 DIAGNOSIS — I482 Chronic atrial fibrillation, unspecified: Secondary | ICD-10-CM

## 2019-10-05 DIAGNOSIS — E785 Hyperlipidemia, unspecified: Secondary | ICD-10-CM

## 2019-10-05 DIAGNOSIS — Z96651 Presence of right artificial knee joint: Secondary | ICD-10-CM | POA: Diagnosis not present

## 2019-10-07 ENCOUNTER — Other Ambulatory Visit: Payer: Self-pay | Admitting: Family Medicine

## 2019-10-07 NOTE — Telephone Encounter (Signed)
Requested medication (s) are due for refill today: yes  Requested medication (s) are on the active medication list: yes  Last refill:  09/09/19  Future visit scheduled: yes  Notes to clinic: insurance requesting alternative medication   Requested Prescriptions  Pending Prescriptions Disp Refills   tamsulosin (FLOMAX) 0.4 MG CAPS capsule [Pharmacy Med Name: TAMSULOSIN HCL 0.4 MG CAP] 30 capsule 11    Sig: TAKE 1 CAPSULE BY MOUTH ONCE DAILY      Urology: Alpha-Adrenergic Blocker Failed - 10/07/2019  2:07 PM      Failed - Last BP in normal range    BP Readings from Last 1 Encounters:  10/05/19 (!) 142/55          Passed - Valid encounter within last 12 months    Recent Outpatient Visits           2 days ago Coronary artery disease involving native coronary artery of native heart with unstable angina pectoris [facility])   [facility] Jerrol Banana., MD   2 months ago Coronary artery disease involving native coronary artery of native heart with unstable angina pectoris [facility])   [facility] Jerrol Banana., MD   4 months ago Viral URI with cough   [place] [place], [place]   6 months ago Coronary artery disease involving native coronary artery of native heart with unstable angina pectoris [facility])   [facility] Jerrol Banana., MD   10 months ago Benign essential HTN   [facility] Jerrol Banana., MD       Future Appointments             In 4 months Jerrol Banana., MD [facility] [place]

## 2019-10-10 NOTE — Telephone Encounter (Signed)
Insurance requesting alternative medication

## 2019-11-02 DIAGNOSIS — I10 Essential (primary) hypertension: Secondary | ICD-10-CM | POA: Diagnosis not present

## 2019-11-02 DIAGNOSIS — I2510 Atherosclerotic heart disease of native coronary artery without angina pectoris: Secondary | ICD-10-CM | POA: Diagnosis not present

## 2019-12-20 DIAGNOSIS — R600 Localized edema: Secondary | ICD-10-CM | POA: Diagnosis not present

## 2019-12-20 DIAGNOSIS — I10 Essential (primary) hypertension: Secondary | ICD-10-CM | POA: Diagnosis not present

## 2019-12-20 DIAGNOSIS — N184 Chronic kidney disease, stage 4 (severe): Secondary | ICD-10-CM | POA: Diagnosis not present

## 2019-12-26 DIAGNOSIS — I10 Essential (primary) hypertension: Secondary | ICD-10-CM | POA: Diagnosis not present

## 2019-12-26 DIAGNOSIS — I25810 Atherosclerosis of coronary artery bypass graft(s) without angina pectoris: Secondary | ICD-10-CM | POA: Diagnosis not present

## 2019-12-26 DIAGNOSIS — I493 Ventricular premature depolarization: Secondary | ICD-10-CM | POA: Diagnosis not present

## 2019-12-26 DIAGNOSIS — I6523 Occlusion and stenosis of bilateral carotid arteries: Secondary | ICD-10-CM | POA: Diagnosis not present

## 2019-12-26 DIAGNOSIS — I480 Paroxysmal atrial fibrillation: Secondary | ICD-10-CM | POA: Diagnosis not present

## 2019-12-26 DIAGNOSIS — E782 Mixed hyperlipidemia: Secondary | ICD-10-CM | POA: Diagnosis not present

## 2020-01-03 ENCOUNTER — Encounter (INDEPENDENT_AMBULATORY_CARE_PROVIDER_SITE_OTHER): Payer: PPO

## 2020-01-03 ENCOUNTER — Ambulatory Visit (INDEPENDENT_AMBULATORY_CARE_PROVIDER_SITE_OTHER): Payer: PPO | Admitting: Vascular Surgery

## 2020-01-04 ENCOUNTER — Encounter (INDEPENDENT_AMBULATORY_CARE_PROVIDER_SITE_OTHER): Payer: Self-pay | Admitting: Nurse Practitioner

## 2020-01-04 ENCOUNTER — Ambulatory Visit (INDEPENDENT_AMBULATORY_CARE_PROVIDER_SITE_OTHER): Payer: PPO

## 2020-01-04 ENCOUNTER — Ambulatory Visit (INDEPENDENT_AMBULATORY_CARE_PROVIDER_SITE_OTHER): Payer: PPO | Admitting: Nurse Practitioner

## 2020-01-04 ENCOUNTER — Other Ambulatory Visit: Payer: Self-pay

## 2020-01-04 VITALS — BP 126/57 | HR 44 | Resp 15 | Wt 156.8 lb

## 2020-01-04 DIAGNOSIS — I771 Stricture of artery: Secondary | ICD-10-CM | POA: Diagnosis not present

## 2020-01-04 DIAGNOSIS — N1832 Chronic kidney disease, stage 3b: Secondary | ICD-10-CM

## 2020-01-04 DIAGNOSIS — I6523 Occlusion and stenosis of bilateral carotid arteries: Secondary | ICD-10-CM | POA: Diagnosis not present

## 2020-01-04 DIAGNOSIS — E785 Hyperlipidemia, unspecified: Secondary | ICD-10-CM

## 2020-01-04 DIAGNOSIS — I10 Essential (primary) hypertension: Secondary | ICD-10-CM | POA: Diagnosis not present

## 2020-01-04 NOTE — Progress Notes (Signed)
Subjective:    Patient ID: Austin [name], male    DOB: 03/01/1929, 86 y.o.   MRN: [id number] Chief Complaint  Patient presents with  . Follow-up    ultrasound follow up    The patient is seen for follow up evaluation of carotid stenosis. The carotid stenosis followed by ultrasound.   The patient denies amaurosis fugax. There is no recent history of TIA symptoms or focal motor deficits. There is no prior documented CVA.  The patient is taking enteric-coated aspirin 81 mg daily.  There is no history of migraine headaches. There is no history of seizures.  The patient has a history of coronary artery disease, no recent episodes of angina or shortness of breath. The patient denies PAD or claudication symptoms. There is a history of hyperlipidemia which is being treated with a statin.    Carotid Duplex done today shows 60 to 79% stenosis bilaterally which does show an increase in the bilateral internal carotid artery velocities when compared to the previous exam on 01/03/2019.  The patient also has evidence of a left subclavian stenosis with turbulent flow detected as well as elevated velocities within the artery.   Review of Systems  Constitutional: Positive for fatigue.  Musculoskeletal: Positive for arthralgias.  All other systems reviewed and are negative.      Objective:   Physical Exam Vitals reviewed.  HENT:     Head: Normocephalic.  Neck:     Vascular: Carotid bruit present.  Cardiovascular:     Rate and Rhythm: Normal rate. Rhythm irregular.     Pulses: Normal pulses.  Pulmonary:     Effort: Pulmonary effort is normal.     Breath sounds: Normal breath sounds.  Musculoskeletal:        General: Normal range of motion.  Skin:    General: Skin is warm and dry.  Neurological:     Mental Status: He is alert and oriented to person, place, and time.  Psychiatric:        Mood and Affect: Mood normal.        Behavior: Behavior normal.        Thought Content: Thought  content normal.        Judgment: Judgment normal.     BP (!) 126/57 (BP Location: Right Arm)   Pulse (!) 44   Resp 15   Wt 156 lb 12.8 oz (71.1 kg)   BMI 25.31 kg/m   Past Medical History:  Diagnosis Date  . Absolute anemia 12/08/2014  . Allergy   . Anginal pain ([place])    much less angina d/t lack of activity  . Arthritis    all over body  . Bradycardia 05/03/2015  . Calculus of kidney 12/08/2014  . Coronary artery disease   . GERD (gastroesophageal reflux disease)   . Glaucoma   . History of kidney stones   . HLD (hyperlipidemia)   . Hyperglycemia 08/27/2015  . Hypertension   . Myocardial infarction ([place]) 2011  . Peripheral vascular disease ([place])   . VHD (valvular heart disease)     Social History   Socioeconomic History  . Marital status: Married    Spouse name: Blanch Media  . Number of children: 1  . Years of education: Not on file  . Highest education level: Some college, no degree  Occupational History  . Occupation: Engineer, water: RETIRED  Tobacco Use  . Smoking status: Former Smoker    Years: 15.00  Types: Cigarettes    Quit date: 1990    Years since quitting: 31.6  . Smokeless tobacco: Never Used  Vaping Use  . Vaping Use: Never used  Substance and Sexual Activity  . Alcohol use: Yes    Alcohol/week: 7.0 - 14.0 standard drinks    Types: 7 - 14 Glasses of wine per week    Comment: 1-2 glasses of wine a night  . Drug use: No  . Sexual activity: Not Currently  Other Topics Concern  . Not on file  Social History Narrative  . Not on file   Social Determinants of Health   Financial Resource Strain: Low Risk   . Difficulty of Paying Living Expenses: Not hard at all  Food Insecurity: No Food Insecurity  . Worried About Charity fundraiser in the Last Year: Never true  . Ran Out of Food in the Last Year: Never true  Transportation Needs: No Transportation Needs  . Lack of Transportation (Medical): No  . Lack of Transportation  (Non-Medical): No  Physical Activity: Inactive  . Days of Exercise per Week: 0 days  . Minutes of Exercise per Session: 0 min  Stress: No Stress Concern Present  . Feeling of Stress : Not at all  Social Connections: Unknown  . Frequency of Communication with Friends and Family: Patient refused  . Frequency of Social Gatherings with Friends and Family: Patient refused  . Attends Religious Services: Patient refused  . Active Member of Clubs or Organizations: Patient refused  . Attends Archivist Meetings: Patient refused  . Marital Status: Patient refused  Intimate Partner Violence: Unknown  . Fear of Current or Ex-Partner: Patient refused  . Emotionally Abused: Patient refused  . Physically Abused: Patient refused  . Sexually Abused: Patient refused    Past Surgical History:  Procedure Laterality Date  . ANTERIOR CERVICAL DECOMP/DISCECTOMY FUSION N/A 12/30/2017   Procedure: ANTERIOR CERVICAL DECOMPRESSION/DISCECTOMY FUSION 3 LEVELS- C3-6;  Surgeon: Meade Maw, MD;  Location: [facility] ORS;  Service: Neurosurgery;  Laterality: N/A;  . CARDIAC CATHETERIZATION  2007  . CARDIAC CATHETERIZATION N/A 04/17/2015   Procedure: Left Heart Cath and Coronary Angiography;  Surgeon: Corey Skains, MD;  Location: [place] [facility];  Service: Cardiovascular;  Laterality: N/A;  . CARPAL TUNNEL RELEASE Bilateral    right 06/09/08 left 05/03/09  . CATARACT EXTRACTION W/ INTRAOCULAR LENS  IMPLANT, BILATERAL    . COLONOSCOPY N/A 10/02/2014   Procedure: COLONOSCOPY;  Surgeon: Manya Silvas, MD;  [facility] ENDOSCOPY;  Service: Endoscopy;  Laterality: N/A;  . CORONARY ARTERY BYPASS GRAFT  2007  . ESOPHAGOGASTRODUODENOSCOPY    . EYE SURGERY    . HEMORROIDECTOMY    . JOINT REPLACEMENT Right 03/2017   partial knee replacement  . KNEE ARTHROSCOPY Right 08/05/2016   Procedure: ARTHROSCOPY KNEE;  Surgeon: Thornton Park, MD;  Location: [facility] ORS;  Service: Orthopedics;  Laterality: Right;   . KNEE ARTHROSCOPY WITH LATERAL RELEASE Right 08/05/2016   Procedure: KNEE ARTHROSCOPY WITH LATERAL RELEASE;  Surgeon: Thornton Park, MD;  Location: [facility] ORS;  Service: Orthopedics;  Laterality: Right;  . KNEE ARTHROSCOPY WITH MEDIAL MENISECTOMY Right 08/05/2016   Procedure: KNEE ARTHROSCOPY WITH MEDIAL MENISECTOMY;  Surgeon: Thornton Park, MD;  Location: [facility] ORS;  Service: Orthopedics;  Laterality: Right;  Partial  . LUMBAR EPIDURAL INJECTION  2011  . LUMBAR LAMINECTOMY/DECOMPRESSION MICRODISCECTOMY N/A 03/10/2018   Procedure: LUMBAR LAMINECTOMY/DECOMPRESSION MICRODISCECTOMY 3 LEVELS-L2-5;  Surgeon: Meade Maw, MD;  Location: [facility] ORS;  Service: Neurosurgery;  Laterality: N/A;  . MOUTH SURGERY     tooth implant  . SHOULDER SURGERY Right 2011   rotator cuff repair  . SYNOVECTOMY Right 08/05/2016   Procedure: SYNOVECTOMY;  Surgeon: Thornton Park, MD;  Location: [facility] ORS;  Service: Orthopedics;  Laterality: Right;  arthroscopic extensive synovectomy  . TONSILLECTOMY      Family History  Problem Relation Age of Onset  . Heart disease Mother   . Heart disease Father   . Heart disease Brother   . Heart disease Brother   . Heart disease Brother   . Heart disease Brother   . Heart disease Brother     Allergies  Allergen Reactions  . Penicillins Itching, Swelling, Rash and Other (See Comments)    Has patient had a PCN reaction causing immediate rash, facial/tongue/throat swelling, SOB or lightheadedness with hypotension: Yes Has patient had a PCN reaction causing severe rash involving mucus membranes or skin necrosis: No Has patient had a PCN reaction that required hospitalization No Has patient had a PCN reaction occurring within the last 10 years: No If all of the above answers are "NO", then may proceed with Cephalosporin use.  Other reaction(s): Other (see comments) Has patient had a PCN reaction causing immediate rash, facial/tongue/throat swelling, SOB or lightheadedness  with hypotension: Yes Has patient had a PCN reaction causing severe rash involving mucus membranes or skin necrosis: No Has patient had a PCN reaction that required hospitalization No Has patient had a PCN reaction occurring within the last 10 years: No If all of the above answers are "NO", then may proceed with Cephalosporin use. Has patient had a PCN reaction causing immediate rash, facial/tongue/throat swelling, SOB or lightheadedness with hypotension: Yes Has patient had a PCN reaction causing severe rash involving mucus membranes or skin necrosis: No Has patient had a PCN reaction that required hospitalization No Has patient had a PCN reaction occurring within the last 10 years: No If all of the above answers are "NO", then may proceed with Cephalosporin use. Has patient had a PCN reaction causing immediate rash, facial/tongue/throat swelling, SOB or lightheadedness with hypotension: Yes Has patient had a PCN reaction causing severe rash involving mucus membranes or skin necrosis: No Has patient had a PCN reaction that required hospitalization No Has patient had a PCN reaction occurring within the last 10 years: No If all of the above answers are "NO", then may proceed with Cephalosporin use.       Assessment & Plan:   1. Bilateral carotid artery stenosis Recommend:  Given the patient's asymptomatic subcritical stenosis no further invasive testing or surgery at this time.  Duplex ultrasound shows 60-79% stenosis bilaterally.  Continue antiplatelet therapy as prescribed Continue management of CAD, HTN and Hyperlipidemia Healthy heart diet,  encouraged exercise at least 4 times per week Follow up in 6 months with duplex ultrasound and physical exam   2. Benign essential HTN Continue antihypertensive medications as already ordered, these medications have been reviewed and there are no changes at this time.   3. Hyperlipidemia, unspecified hyperlipidemia type Continue statin as  ordered and reviewed, no changes at this time   4. Stage 3b chronic kidney disease Post the patient's recent heart attack in February he has had some worsening of his chronic kidney disease.  The patient notes that he may have to begin dialysis soon.  We discussed peritoneal dialysis versus hemodialysis.  We also discussed the demonstrated AV fistula versus a graft.  Patient is advised that if his chronic  kidney disease progresses and dialysis is needed, to send a referral so that we may begin the proper work-up.   5. Stenosis of left subclavian artery (HCC) Noninvasive studies also indicate stenosis of the left subclavian artery.  Patient is currently asymptomatic and denies any dizziness, numbness or tingling of the upper extremity.  Based on this we will continue to follow with his biannual carotid duplex studies.  Patient is also advised that when taking his blood pressure the right upper extremity is a more accurate representation versus his left.     Current Outpatient Medications on File Prior to Visit  Medication Sig Dispense Refill  . amiodarone (PACERONE) 200 MG tablet Take 1 tablet (200 mg total) by mouth daily. 30 tablet 0  . amLODipine (NORVASC) 5 MG tablet Take 5 mg by mouth daily.    Marland Kitchen apixaban (ELIQUIS) 2.5 MG TABS tablet Take 1 tablet (2.5 mg total) by mouth 2 (two) times daily. 60 tablet 0  . aspirin EC 81 MG EC tablet Take 1 tablet (81 mg total) by mouth daily. 90 tablet 0  . atorvastatin (LIPITOR) 20 MG tablet Take 20 mg by mouth daily.     . Biotin 5000 MCG CAPS Take 5,000 mcg by mouth daily.     . Cholecalciferol (VITAMIN D3) 2000 UNITS capsule Take 2,000 Units by mouth 2 (two) times daily.     . Coenzyme Q10 (COQ-10) 200 MG CAPS Take 200 mg by mouth daily.    . dorzolamide-timolol (COSOPT) 22.3-6.8 MG/ML ophthalmic solution Place 1 drop into both eyes 2 (two) times daily.     . isosorbide mononitrate (IMDUR) 60 MG 24 hr tablet Take 60 mg by mouth daily.     . Liniments  (SALONPAS PAIN RELIEF PATCH EX) Apply 1 patch topically daily as needed (for pain).    . Melatonin 5 MG CAPS Take 5 mg by mouth at bedtime as needed (for sleep).     . Multiple Vitamins-Minerals (PRESERVISION AREDS 2) CAPS Take 1 capsule by mouth 2 (two) times daily.    . nitroGLYCERIN (NITROSTAT) 0.4 MG SL tablet Place 0.4 mg under the tongue every 5 (five) minutes as needed for chest pain.     . Omega-3 Fatty Acids (FISH OIL) 1200 MG CAPS Take 1,200 mg by mouth daily.     Marland Kitchen omeprazole (PRILOSEC) 20 MG capsule Take 20 mg by mouth daily.     Vladimir Faster Glycol-Propyl Glycol (SYSTANE OP) Place 1 drop into the right eye daily as needed (for blurriness).     . tamsulosin (FLOMAX) 0.4 MG CAPS capsule TAKE 1 CAPSULE BY MOUTH ONCE DAILY 30 capsule 11  . torsemide (DEMADEX) 20 MG tablet Take 20 mg by mouth daily.    . Travoprost, BAK Free, (TRAVATAN Z) 0.004 % SOLN ophthalmic solution Place 1 drop into both eyes at bedtime.     . Turmeric 500 MG TABS Take 1,000 mg by mouth daily.    . vitamin B-12 (CYANOCOBALAMIN) 1000 MCG tablet Take 1,000 mcg by mouth daily.    . Glucosamine HCl (GLUCOSAMINE PO) Take 1 tablet by mouth 2 (two) times daily. (Patient not taking: Reported on 01/04/2020)     No current facility-administered medications on file prior to visit.    There are no Patient Instructions on file for this visit. No follow-ups on file.   Kris Hartmann, NP

## 2020-01-30 DIAGNOSIS — H40003 Preglaucoma, unspecified, bilateral: Secondary | ICD-10-CM | POA: Diagnosis not present

## 2020-02-02 NOTE — Progress Notes (Signed)
Trena Platt Cummings,acting as a scribe for Wilhemena Durie, MD.,have documented all relevant documentation on the behalf of Wilhemena Durie, MD,as directed by  Wilhemena Durie, MD while in the presence of Wilhemena Durie, MD.  Established patient visit   Patient: Austin [name]   DOB: Sep 04, 1928   86 y.o. Male  MRN: [id number] Visit Date: 02/07/2020  Today's healthcare provider: Wilhemena Durie, MD   Chief Complaint  Patient presents with  . Hyperlipidemia  . Hypertension   Subjective    HPI  Overall patient is feeling well and things are stable.  He is taking his medications and is having no problems. Hypertension, follow-up  BP Readings from Last 3 Encounters:  02/07/20 (!) 113/51  01/04/20 (!) 126/57  10/05/19 (!) 142/55   Wt Readings from Last 3 Encounters:  02/07/20 157 lb 6.4 oz (71.4 kg)  01/04/20 156 lb 12.8 oz (71.1 kg)  10/05/19 166 lb (75.3 kg)     He was last seen for hypertension 4 months ago.  BP at that visit was 142/55. Management since that visit includes; Controlled on amlodipine. He reports excellent compliance with treatment. He is not having side effects.  He is exercising. He is adherent to low salt diet.   Outside blood pressures are being checked.  He does not smoke.  Use of agents associated with hypertension: none.   --------------------------------------------------------------------------------------------------- Lipid/Cholesterol, follow-up  Last Lipid Panel: Lab Results  Component Value Date   CHOL 134 11/23/2018   LDLCALC 69 11/23/2018   HDL 49 11/23/2018   TRIG 78 11/23/2018    He was last seen for this 4 months ago.  Management since that visit includes .  He reports excellent compliance with treatment. He is not having side effects.    He is following a Low fat, Low Sodium diet. Current exercise: bicycling and walking  Last metabolic panel Lab Results  Component Value Date   GLUCOSE 103 (H)  07/15/2019   NA 133 (L) 07/15/2019   K 4.1 07/15/2019   BUN 62 (H) 07/15/2019   CREATININE 2.60 (H) 07/15/2019   GFRNONAA 21 (L) 07/15/2019   GFRAA 24 (L) 07/15/2019   CALCIUM 8.8 (L) 07/15/2019   AST 22 07/09/2019   ALT 24 07/09/2019   The ASCVD Risk score Mikey Bussing DC Jr., et al., 2013) failed to calculate for the following reasons:   The 2013 ASCVD risk score is only valid for ages 50 to 24   The patient has a prior MI or stroke diagnosis  ---------------------------------------------------------------------------------------------------  Chronic atrial fibrillation ([place]) From 10/05/2019-On amiodarone and Eliquis  Borderline diabetes From 10/05/2019-Completely stable.  Follow routine sugars.  Bradycardia From 10/05/2019-We will have him see cardiology sooner rather than this late summer appointment.  It is possible that this bradycardia is significant enough that 86 years old to cause him problems.  I think is reasonable to at least talk about what options he has.  Social History   Tobacco Use  . Smoking status: Former Smoker    Years: 15.00    Types: Cigarettes    Quit date: 1990    Years since quitting: 31.7  . Smokeless tobacco: Never Used  Vaping Use  . Vaping Use: Never used  Substance Use Topics  . Alcohol use: Yes    Alcohol/week: 7.0 - 14.0 standard drinks    Types: 7 - 14 Glasses of wine per week    Comment: 1-2 glasses of wine a night  . Drug  use: No       Medications: Outpatient Medications Prior to Visit  Medication Sig  . amLODipine (NORVASC) 5 MG tablet Take 5 mg by mouth daily.  Marland Kitchen aspirin EC 81 MG EC tablet Take 1 tablet (81 mg total) by mouth daily.  Marland Kitchen atorvastatin (LIPITOR) 20 MG tablet Take 20 mg by mouth daily.   . Biotin 5000 MCG CAPS Take 5,000 mcg by mouth daily.   . Cholecalciferol (VITAMIN D3) 2000 UNITS capsule Take 2,000 Units by mouth 2 (two) times daily.   . Coenzyme Q10 (COQ-10) 200 MG CAPS Take 200 mg by mouth daily.  .  dorzolamide-timolol (COSOPT) 22.3-6.8 MG/ML ophthalmic solution Place 1 drop into both eyes 2 (two) times daily.   . isosorbide mononitrate (IMDUR) 60 MG 24 hr tablet Take 60 mg by mouth daily.   . Liniments (SALONPAS PAIN RELIEF PATCH EX) Apply 1 patch topically daily as needed (for pain).  . Melatonin 5 MG CAPS Take 5 mg by mouth at bedtime as needed (for sleep).   . Multiple Vitamins-Minerals (PRESERVISION AREDS 2) CAPS Take 1 capsule by mouth 2 (two) times daily.  . nitroGLYCERIN (NITROSTAT) 0.4 MG SL tablet Place 0.4 mg under the tongue every 5 (five) minutes as needed for chest pain.   . Omega-3 Fatty Acids (FISH OIL) 1200 MG CAPS Take 1,200 mg by mouth daily.   Marland Kitchen omeprazole (PRILOSEC) 20 MG capsule Take 20 mg by mouth daily.   Vladimir Faster Glycol-Propyl Glycol (SYSTANE OP) Place 1 drop into the right eye daily as needed (for blurriness).   . tamsulosin (FLOMAX) 0.4 MG CAPS capsule TAKE 1 CAPSULE BY MOUTH ONCE DAILY  . torsemide (DEMADEX) 20 MG tablet Take 20 mg by mouth daily.  . Travoprost, BAK Free, (TRAVATAN Z) 0.004 % SOLN ophthalmic solution Place 1 drop into both eyes at bedtime.   . Turmeric 500 MG TABS Take 1,000 mg by mouth daily.  . vitamin B-12 (CYANOCOBALAMIN) 1000 MCG tablet Take 1,000 mcg by mouth daily.  Marland Kitchen amiodarone (PACERONE) 200 MG tablet Take 1 tablet (200 mg total) by mouth daily.  Marland Kitchen apixaban (ELIQUIS) 2.5 MG TABS tablet Take 1 tablet (2.5 mg total) by mouth 2 (two) times daily.  . Glucosamine HCl (GLUCOSAMINE PO) Take 1 tablet by mouth 2 (two) times daily. (Patient not taking: Reported on 01/04/2020)   No facility-administered medications prior to visit.    Review of Systems  Last lipids Lab Results  Component Value Date   CHOL 134 11/23/2018   HDL 49 11/23/2018   LDLCALC 69 11/23/2018   TRIG 78 11/23/2018   CHOLHDL 2.7 11/23/2018      Objective    BP (!) 113/51 (BP Location: Left Arm, Patient Position: Sitting, Cuff Size: Large)   Pulse 62   Temp 98.2 F  (36.8 C) (Oral)   Ht 5\' 6"  (1.676 m)   Wt 157 lb 6.4 oz (71.4 kg)   BMI 25.41 kg/m  BP Readings from Last 3 Encounters:  02/07/20 (!) 113/51  01/04/20 (!) 126/57  10/05/19 (!) 142/55   Wt Readings from Last 3 Encounters:  02/07/20 157 lb 6.4 oz (71.4 kg)  01/04/20 156 lb 12.8 oz (71.1 kg)  10/05/19 166 lb (75.3 kg)      Physical Exam Vitals reviewed.  Constitutional:      Appearance: Normal appearance.  HENT:     Head: Normocephalic and atraumatic.     Right Ear: External ear normal.     Left Ear: External  ear normal.  Eyes:     General: No scleral icterus.    Conjunctiva/sclera: Conjunctivae normal.  Cardiovascular:     Rate and Rhythm: Normal rate and regular rhythm.     Pulses: Normal pulses.     Heart sounds: Normal heart sounds.  Pulmonary:     Effort: Pulmonary effort is normal.     Breath sounds: Normal breath sounds.  Abdominal:     Palpations: Abdomen is soft.  Musculoskeletal:     Right lower leg: No edema.     Left lower leg: No edema.  Skin:    General: Skin is warm and dry.  Neurological:     General: No focal deficit present.     Mental Status: He is alert and oriented to person, place, and time.  Psychiatric:        Mood and Affect: Mood normal.        Behavior: Behavior normal.        Thought Content: Thought content normal.        Judgment: Judgment normal.       No results found for any visits on 02/07/20.  Assessment & Plan     1. Benign essential HTN Good control.  2. Hyperlipidemia, unspecified hyperlipidemia type On atorvastatin 20  3. Need for influenza vaccination  - Flu Vaccine QUAD High Dose(Fluad)  4. Hyperglycemia Controlled in recent years. - POCT HgB A1C 5.9 today  5. Stage 3b chronic kidney disease   6. Coronary artery disease involving native coronary artery of native heart with unstable angina pectoris ([place]) All risk factors treated  7. Cardiomyopathy, ischemic   8. Chronic atrial fibrillation (HCC) On  Eliquis   No follow-ups on file.      I, Wilhemena Durie, MD, have reviewed all documentation for this visit. The documentation on 02/17/20 for the exam, diagnosis, procedures, and orders are all accurate and complete.    Itzel Mckibbin Cranford Mon, MD  [facility] [phone] (phone) [phone] (fax)  [place]

## 2020-02-07 ENCOUNTER — Other Ambulatory Visit: Payer: Self-pay

## 2020-02-07 ENCOUNTER — Ambulatory Visit (INDEPENDENT_AMBULATORY_CARE_PROVIDER_SITE_OTHER): Payer: PPO | Admitting: Family Medicine

## 2020-02-07 ENCOUNTER — Encounter: Payer: Self-pay | Admitting: Family Medicine

## 2020-02-07 VITALS — BP 113/51 | HR 62 | Temp 98.2°F | Ht 66.0 in | Wt 157.4 lb

## 2020-02-07 DIAGNOSIS — I255 Ischemic cardiomyopathy: Secondary | ICD-10-CM | POA: Diagnosis not present

## 2020-02-07 DIAGNOSIS — I482 Chronic atrial fibrillation, unspecified: Secondary | ICD-10-CM | POA: Diagnosis not present

## 2020-02-07 DIAGNOSIS — Z23 Encounter for immunization: Secondary | ICD-10-CM | POA: Diagnosis not present

## 2020-02-07 DIAGNOSIS — N1832 Chronic kidney disease, stage 3b: Secondary | ICD-10-CM

## 2020-02-07 DIAGNOSIS — I10 Essential (primary) hypertension: Secondary | ICD-10-CM | POA: Diagnosis not present

## 2020-02-07 DIAGNOSIS — R739 Hyperglycemia, unspecified: Secondary | ICD-10-CM

## 2020-02-07 DIAGNOSIS — E785 Hyperlipidemia, unspecified: Secondary | ICD-10-CM | POA: Diagnosis not present

## 2020-02-07 DIAGNOSIS — I25110 Atherosclerotic heart disease of native coronary artery with unstable angina pectoris: Secondary | ICD-10-CM

## 2020-02-07 LAB — POCT GLYCOSYLATED HEMOGLOBIN (HGB A1C)
Estimated Average Glucose: 123
Hemoglobin A1C: 5.9 % — AB (ref 4.0–5.6)

## 2020-02-09 DIAGNOSIS — L578 Other skin changes due to chronic exposure to nonionizing radiation: Secondary | ICD-10-CM | POA: Diagnosis not present

## 2020-02-09 DIAGNOSIS — Z872 Personal history of diseases of the skin and subcutaneous tissue: Secondary | ICD-10-CM | POA: Diagnosis not present

## 2020-02-09 DIAGNOSIS — L570 Actinic keratosis: Secondary | ICD-10-CM | POA: Diagnosis not present

## 2020-02-09 DIAGNOSIS — Z86018 Personal history of other benign neoplasm: Secondary | ICD-10-CM | POA: Diagnosis not present

## 2020-02-10 DIAGNOSIS — H40003 Preglaucoma, unspecified, bilateral: Secondary | ICD-10-CM | POA: Diagnosis not present

## 2020-02-27 DIAGNOSIS — H353211 Exudative age-related macular degeneration, right eye, with active choroidal neovascularization: Secondary | ICD-10-CM | POA: Diagnosis not present

## 2020-04-02 DIAGNOSIS — H353211 Exudative age-related macular degeneration, right eye, with active choroidal neovascularization: Secondary | ICD-10-CM | POA: Diagnosis not present

## 2020-04-19 DIAGNOSIS — E782 Mixed hyperlipidemia: Secondary | ICD-10-CM | POA: Diagnosis not present

## 2020-04-19 DIAGNOSIS — I10 Essential (primary) hypertension: Secondary | ICD-10-CM | POA: Diagnosis not present

## 2020-04-19 DIAGNOSIS — I5032 Chronic diastolic (congestive) heart failure: Secondary | ICD-10-CM | POA: Diagnosis not present

## 2020-04-19 DIAGNOSIS — I6523 Occlusion and stenosis of bilateral carotid arteries: Secondary | ICD-10-CM | POA: Diagnosis not present

## 2020-04-19 DIAGNOSIS — I480 Paroxysmal atrial fibrillation: Secondary | ICD-10-CM | POA: Diagnosis not present

## 2020-04-19 DIAGNOSIS — I25810 Atherosclerosis of coronary artery bypass graft(s) without angina pectoris: Secondary | ICD-10-CM | POA: Diagnosis not present

## 2020-04-19 DIAGNOSIS — N1832 Chronic kidney disease, stage 3b: Secondary | ICD-10-CM | POA: Diagnosis not present

## 2020-04-23 DIAGNOSIS — I10 Essential (primary) hypertension: Secondary | ICD-10-CM | POA: Diagnosis not present

## 2020-04-23 DIAGNOSIS — R600 Localized edema: Secondary | ICD-10-CM | POA: Diagnosis not present

## 2020-04-23 DIAGNOSIS — N184 Chronic kidney disease, stage 4 (severe): Secondary | ICD-10-CM | POA: Diagnosis not present

## 2020-05-07 DIAGNOSIS — H353211 Exudative age-related macular degeneration, right eye, with active choroidal neovascularization: Secondary | ICD-10-CM | POA: Diagnosis not present

## 2020-05-07 NOTE — Progress Notes (Signed)
Subjective:   Austin [name] is a 86 y.o. male who presents for Medicare Annual/Subsequent preventive examination.  I connected with Austin [name] today by telephone and verified that I am speaking with the correct person using two identifiers. Location patient: home Location provider: work Persons participating in the virtual visit: patient, provider.   I discussed the limitations, risks, security and privacy concerns of performing an evaluation and management service by telephone and the availability of in person appointments. I also discussed with the patient that there may be a patient responsible charge related to this service. The patient expressed understanding and verbally consented to this telephonic visit.    Interactive audio and video telecommunications were attempted between this provider and patient, however failed, due to patient having technical difficulties OR patient did not have access to video capability.  We continued and completed visit with audio only.   Review of Systems    N/A  Cardiac Risk Factors include: advanced age (>68men, >93 women);male gender;hypertension;dyslipidemia     Objective:    There were no vitals filed for this visit. There is no height or weight on file to calculate BMI.  Advanced Directives 05/08/2020 07/09/2019 07/09/2019 04/20/2019 03/10/2018 03/10/2018 03/02/2018  Does Patient Have a Medical Advance Directive? Yes Yes Yes Yes Yes Yes Yes  Type of Paramedic of [place];Living will Living will Living will [place];Living will [place];Living will [place];Living will [place];Living will  Does patient want to make changes to medical advance directive? - No - Patient declined - - No - Patient declined No - Patient declined No - Patient declined  Copy of [place] in Chart? No - copy requested - - No - copy requested Yes  Yes Yes  Would patient like information on creating a medical advance directive? - No - Patient declined No - Patient declined - - - -    Current Medications (verified) Outpatient Encounter Medications as of 05/08/2020  Medication Sig  . amiodarone (PACERONE) 200 MG tablet Take 1 tablet (200 mg total) by mouth daily.  Marland Kitchen amLODipine (NORVASC) 5 MG tablet Take 5 mg by mouth daily.  Marland Kitchen atorvastatin (LIPITOR) 20 MG tablet Take 20 mg by mouth daily.   . Biotin 5000 MCG CAPS Take 5,000 mcg by mouth daily.   . Cholecalciferol (VITAMIN D3) 2000 UNITS capsule Take 2,000 Units by mouth 2 (two) times daily.   . Coenzyme Q10 (COQ-10) 200 MG CAPS Take 200 mg by mouth daily.  . dorzolamide-timolol (COSOPT) 22.3-6.8 MG/ML ophthalmic solution Place 1 drop into both eyes 2 (two) times daily.   . isosorbide mononitrate (IMDUR) 60 MG 24 hr tablet Take 60 mg by mouth daily.   . Melatonin 5 MG CAPS Take 5 mg by mouth at bedtime as needed (for sleep).   . Multiple Vitamins-Minerals (PRESERVISION AREDS 2) CAPS Take 1 capsule by mouth 2 (two) times daily.  . nitroGLYCERIN (NITROSTAT) 0.4 MG SL tablet Place 0.4 mg under the tongue every 5 (five) minutes as needed for chest pain.   Marland Kitchen omeprazole (PRILOSEC) 20 MG capsule Take 20 mg by mouth daily.   Vladimir Faster Glycol-Propyl Glycol (SYSTANE OP) Place 1 drop into the right eye daily as needed (for blurriness).   . tamsulosin (FLOMAX) 0.4 MG CAPS capsule TAKE 1 CAPSULE BY MOUTH ONCE DAILY  . torsemide (DEMADEX) 20 MG tablet Take 20 mg by mouth daily.  . Travoprost, BAK Free, (  TRAVATAN Z) 0.004 % SOLN ophthalmic solution Place 1 drop into both eyes at bedtime.   . Turmeric 500 MG TABS Take 1,000 mg by mouth daily.  . vitamin B-12 (CYANOCOBALAMIN) 1000 MCG tablet Take 1,000 mcg by mouth daily.  Marland Kitchen apixaban (ELIQUIS) 2.5 MG TABS tablet Take 1 tablet (2.5 mg total) by mouth 2 (two) times daily.  Marland Kitchen aspirin EC 81 MG EC tablet Take 1 tablet (81 mg total) by mouth daily. (Patient  not taking: Reported on 05/08/2020)  . Glucosamine HCl (GLUCOSAMINE PO) Take 1 tablet by mouth 2 (two) times daily. (Patient not taking: Reported on 01/04/2020)  . Liniments (SALONPAS PAIN RELIEF PATCH EX) Apply 1 patch topically daily as needed (for pain). (Patient not taking: Reported on 05/08/2020)  . Omega-3 Fatty Acids (FISH OIL) 1200 MG CAPS Take 1,200 mg by mouth daily.  (Patient not taking: Reported on 05/08/2020)   No facility-administered encounter medications on file as of 05/08/2020.    Allergies (verified) Penicillins   History: Past Medical History:  Diagnosis Date  . Absolute anemia 12/08/2014  . Allergy   . Anginal pain ([place])    much less angina d/t lack of activity  . Arthritis    all over body  . Bradycardia 05/03/2015  . Calculus of kidney 12/08/2014  . Coronary artery disease   . GERD (gastroesophageal reflux disease)   . Glaucoma   . History of kidney stones   . HLD (hyperlipidemia)   . Hyperglycemia 08/27/2015  . Hypertension   . Macular degeneration   . Myocardial infarction ([place]) 2011  . Peripheral vascular disease ([place])   . VHD (valvular heart disease)    Past Surgical History:  Procedure Laterality Date  . ANTERIOR CERVICAL DECOMP/DISCECTOMY FUSION N/A 12/30/2017   Procedure: ANTERIOR CERVICAL DECOMPRESSION/DISCECTOMY FUSION 3 LEVELS- C3-6;  Surgeon: Meade Maw, MD;  Location: [facility] ORS;  Service: Neurosurgery;  Laterality: N/A;  . CARDIAC CATHETERIZATION  2007  . CARDIAC CATHETERIZATION N/A 04/17/2015   Procedure: Left Heart Cath and Coronary Angiography;  Surgeon: Corey Skains, MD;  Location: [facility];  Service: Cardiovascular;  Laterality: N/A;  . CARPAL TUNNEL RELEASE Bilateral    right 06/09/08 left 05/03/09  . CATARACT EXTRACTION W/ INTRAOCULAR LENS  IMPLANT, BILATERAL    . COLONOSCOPY N/A 10/02/2014   Procedure: COLONOSCOPY;  Surgeon: Manya Silvas, MD;  Location[facility] ENDOSCOPY;  Service: Endoscopy;  Laterality: N/A;  . CORONARY  ARTERY BYPASS GRAFT  2007  . ESOPHAGOGASTRODUODENOSCOPY    . EYE SURGERY    . HEMORROIDECTOMY    . JOINT REPLACEMENT Right 03/2017   partial knee replacement  . KNEE ARTHROSCOPY Right 08/05/2016   Procedure: ARTHROSCOPY KNEE;  Surgeon: Thornton Park, MD;  Location: [facility] ORS;  Service: Orthopedics;  Laterality: Right;  . KNEE ARTHROSCOPY WITH LATERAL RELEASE Right 08/05/2016   Procedure: KNEE ARTHROSCOPY WITH LATERAL RELEASE;  Surgeon: Thornton Park, MD;  Location: [facility] ORS;  Service: Orthopedics;  Laterality: Right;  . KNEE ARTHROSCOPY WITH MEDIAL MENISECTOMY Right 08/05/2016   Procedure: KNEE ARTHROSCOPY WITH MEDIAL MENISECTOMY;  Surgeon: Thornton Park, MD;  Location: [facility] ORS;  Service: Orthopedics;  Laterality: Right;  Partial  . LUMBAR EPIDURAL INJECTION  2011  . LUMBAR LAMINECTOMY/DECOMPRESSION MICRODISCECTOMY N/A 03/10/2018   Procedure: LUMBAR LAMINECTOMY/DECOMPRESSION MICRODISCECTOMY 3 LEVELS-L2-5;  Surgeon: Meade Maw, MD;  Location: [facility] ORS;  Service: Neurosurgery;  Laterality: N/A;  . MOUTH SURGERY     tooth implant  . SHOULDER SURGERY Right 2011   rotator cuff  repair  . SYNOVECTOMY Right 08/05/2016   Procedure: SYNOVECTOMY;  Surgeon: Thornton Park, MD;  Location: [facility] ORS;  Service: Orthopedics;  Laterality: Right;  arthroscopic extensive synovectomy  . TONSILLECTOMY     Family History  Problem Relation Age of Onset  . Heart disease Mother   . Heart disease Father   . Heart disease Brother   . Heart disease Brother   . Heart disease Brother   . Heart disease Brother   . Heart disease Brother    Social History   Socioeconomic History  . Marital status: Married    Spouse name: Blanch Media  . Number of children: 1  . Years of education: Not on file  . Highest education level: Some college, no degree  Occupational History  . Occupation: Engineer, water: RETIRED  Tobacco Use  . Smoking status: Former Smoker    Years: 15.00    Types:  Cigarettes    Quit date: 1990    Years since quitting: 31.9  . Smokeless tobacco: Never Used  Vaping Use  . Vaping Use: Never used  Substance and Sexual Activity  . Alcohol use: Yes    Alcohol/week: 2.0 standard drinks    Types: 2 Glasses of wine per week  . Drug use: No  . Sexual activity: Not Currently  Other Topics Concern  . Not on file  Social History Narrative  . Not on file   Social Determinants of Health   Financial Resource Strain: Low Risk   . Difficulty of Paying Living Expenses: Not hard at all  Food Insecurity: No Food Insecurity  . Worried About Charity fundraiser in the Last Year: Never true  . Ran Out of Food in the Last Year: Never true  Transportation Needs: No Transportation Needs  . Lack of Transportation (Medical): No  . Lack of Transportation (Non-Medical): No  Physical Activity: Sufficiently Active  . Days of Exercise per Week: 5 days  . Minutes of Exercise per Session: 30 min  Stress: No Stress Concern Present  . Feeling of Stress : Not at all  Social Connections: Moderately Integrated  . Frequency of Communication with Friends and Family: More than three times a week  . Frequency of Social Gatherings with Friends and Family: More than three times a week  . Attends Religious Services: Never  . Active Member of Clubs or Organizations: Yes  . Attends Archivist Meetings: More than 4 times per year  . Marital Status: Married    Tobacco Counseling Counseling given: Not Answered   Clinical Intake:  Pre-visit preparation completed: Yes  Pain : No/denies pain     Nutritional Risks: None Diabetes: No  How often do you need to have someone help you when you read instructions, pamphlets, or other written materials from your doctor or pharmacy?: 1 - Never  Diabetic? Pre-diabetic  Interpreter Needed?: No  Information entered by :: [facility], LPN   Activities of Daily Living In your present state of health, do you have any  difficulty performing the following activities: 05/08/2020 07/09/2019  Hearing? N Y  Comment Wears bilateral hearing aids. -  Vision? N N  Comment Wear eye glasses daily. -  Difficulty concentrating or making decisions? N N  Walking or climbing stairs? N N  Dressing or bathing? N N  Doing errands, shopping? N N  Preparing Food and eating ? N -  Using the Toilet? N -  In the past six months, have you  accidently leaked urine? N -  Do you have problems with loss of bowel control? N -  Managing your Medications? N -  Managing your Finances? N -  Housekeeping or managing your Housekeeping? N -  Some recent data might be hidden    Patient Care Team: Jerrol Banana., MD as PCP - General (Family Medicine) Lovell Sheehan, MD as Consulting Physician (Orthopedic Surgery) Corey Skains, MD as Consulting Physician (Cardiology) Leandrew Koyanagi, MD as Referring Physician (Ophthalmology) Lucky Cowboy Erskine Squibb, MD as Referring Physician (Vascular Surgery) Murlean Iba, MD (Nephrology)  Indicate any recent Medical Services you may have received from other than [facility] providers in the past year (date may be approximate).     Assessment:   This is a routine wellness examination for [place].  Hearing/Vision screen No exam data present  Dietary issues and exercise activities discussed: Current Exercise Habits: Home exercise routine, Type of exercise: walking;Other - see comments (rides a stationary bike), Time (Minutes): 30, Frequency (Times/Week): 5, Weekly Exercise (Minutes/Week): 150, Intensity: Mild, Exercise limited by: None identified  Goals    . Prevent falls     Recommend to remove any items from the home that may cause slips or trips.      Depression Screen PHQ 2/9 Scores 05/08/2020 04/20/2019 01/11/2018 12/22/2017 10/07/2017 09/16/2017 03/05/2017  PHQ - 2 Score 0 0 0 0 0 0 0  Exception Documentation - - - - - - -    Fall Risk Fall Risk  05/08/2020 04/20/2019 01/11/2018 12/22/2017  12/16/2017  Falls in the past year? 1 0 No No No  Number falls in past yr: 0 0 - - -  Injury with Fall? 0 0 - - -  Risk for fall due to : - - - - -  Risk for fall due to: Comment - - - - -  Follow up Falls prevention discussed - - - -    FALL RISK PREVENTION PERTAINING TO THE HOME:  Any stairs in or around the home? No  If so, are there any without handrails? No  Home free of loose throw rugs in walkways, pet beds, electrical cords, etc? Yes  Adequate lighting in your home to reduce risk of falls? Yes   ASSISTIVE DEVICES UTILIZED TO PREVENT FALLS:  Life alert? No  Use of a cane, walker or w/c? No  Grab bars in the bathroom? Yes  Shower chair or bench in shower? Yes  Elevated toilet seat or a handicapped toilet? Yes    Cognitive Function:      6CIT Screen 05/08/2020 04/20/2019 03/05/2017  What Year? 0 points 0 points 0 points  What month? 0 points 0 points 0 points  What time? 0 points 0 points 0 points  Count back from 20 0 points 0 points 0 points  Months in reverse 0 points 0 points 0 points  Repeat phrase 0 points 0 points 0 points  Total Score 0 0 0    Immunizations Immunization History  Administered Date(s) Administered  . Fluad Quad(high Dose 65+) 02/09/2019, 02/07/2020  . Influenza, High Dose Seasonal PF 02/27/2016, 04/18/2017, 02/22/2018  . Influenza-Unspecified 03/03/2015  . PFIZER SARS-COV-2 Vaccination 07/28/2019, 08/30/2019  . Pneumococcal Conjugate-13 12/14/2013  . Pneumococcal Polysaccharide-23 04/04/1998, 03/25/2006  . Td 05/01/2008  . Tdap 02/25/2011  . Zoster 10/07/2011  . Zoster Recombinat (Shingrix) 03/13/2020    TDAP status: Up to date  Flu Vaccine status: Up to date  Pneumococcal vaccine status: Up to date  Covid-19 vaccine status:  Completed vaccines  Qualifies for Shingles Vaccine? Yes   Zostavax completed Yes   Shingrix Completed?: No.    Education has been provided regarding the importance of this vaccine. Patient has been advised to  call insurance company to determine out of pocket expense if they have not yet received this vaccine. Advised may also receive vaccine at local pharmacy or Health Dept. Verbalized acceptance and understanding.  Screening Tests Health Maintenance  Topic Date Due  . TETANUS/TDAP  02/24/2021  . INFLUENZA VACCINE  Completed  . COVID-19 Vaccine  Completed  . PNA vac Low Risk Adult  Completed    Health Maintenance  There are no preventive care reminders to display for this patient.  Colorectal cancer screening: No longer required.   Lung Cancer Screening: (Low Dose CT Chest recommended if Age 39-80 years, 30 pack-year currently smoking OR have quit w/in 15years.) does not qualify.    Additional Screening:   Vision Screening: Recommended annual ophthalmology exams for early detection of glaucoma and other disorders of the eye. Is the patient up to date with their annual eye exam?  Yes  Who is the provider or what is the name of the office in which the patient attends annual eye exams? Dr Wallace Going @ [facility] If pt is not established with a provider, would they like to be referred to a provider to establish care? No .   Dental Screening: Recommended annual dental exams for proper oral hygiene  Community Resource Referral / Chronic Care Management: CRR required this visit?  No   CCM required this visit?  No      Plan:     I have personally reviewed and noted the following in the patient's chart:   . Medical and social history . Use of alcohol, tobacco or illicit drugs  . Current medications and supplements . Functional ability and status . Nutritional status . Physical activity . Advanced directives . List of other physicians . Hospitalizations, surgeries, and ER visits in previous 12 months . Vitals . Screenings to include cognitive, depression, and falls . Referrals and appointments  In addition, I have reviewed and discussed with patient certain preventive protocols,  quality metrics, and best practice recommendations. A written personalized care plan for preventive services as well as general preventive health recommendations were provided to patient.     Corrado Hymon [place], [place]   32/08/4008   Nurse Notes: None.

## 2020-05-08 ENCOUNTER — Other Ambulatory Visit: Payer: Self-pay

## 2020-05-08 ENCOUNTER — Ambulatory Visit (INDEPENDENT_AMBULATORY_CARE_PROVIDER_SITE_OTHER): Payer: PPO

## 2020-05-08 DIAGNOSIS — Z0000 Encounter for general adult medical examination without abnormal findings: Secondary | ICD-10-CM

## 2020-05-08 NOTE — Patient Instructions (Signed)
Austin [name] , Thank you for taking time to come for your Medicare Wellness Visit. I appreciate your ongoing commitment to your health goals. Please review the following plan we discussed and let me know if I can assist you in the future.   Screening recommendations/referrals: Colonoscopy: No longer required.  Recommended yearly ophthalmology/optometry visit for glaucoma screening and checkup Recommended yearly dental visit for hygiene and checkup  Vaccinations: Influenza vaccine: Done 02/07/20 Pneumococcal vaccine: Completed series Tdap vaccine: Up to date, due 01/2021 Shingles vaccine: Up to date, second Shingrix dose #2 due 05/13/20    Advanced directives: Please bring a copy of your POA (Power of [place]) and/or Living Will to your next appointment.   Conditions/risks identified: Fall risk preventatives discussed today.   Next appointment: 06/13/20 @ 10:20 AM with Dr Rosanna Randy   Preventive Care 4 Years and Older, Male Preventive care refers to lifestyle choices and visits with your health care provider that can promote health and wellness. What does preventive care include?  A yearly physical exam. This is also called an annual well check.  Dental exams once or twice a year.  Routine eye exams. Ask your health care provider how often you should have your eyes checked.  Personal lifestyle choices, including:  Daily care of your teeth and gums.  Regular physical activity.  Eating a healthy diet.  Avoiding tobacco and drug use.  Limiting alcohol use.  Practicing safe sex.  Taking low doses of aspirin every day.  Taking vitamin and mineral supplements as recommended by your health care provider. What happens during an annual well check? The services and screenings done by your health care provider during your annual well check will depend on your age, overall health, lifestyle risk factors, and family history of disease. Counseling  Your health care provider may ask you  questions about your:  Alcohol use.  Tobacco use.  Drug use.  Emotional well-being.  Home and relationship well-being.  Sexual activity.  Eating habits.  History of falls.  Memory and ability to understand (cognition).  Work and work Statistician. Screening  You may have the following tests or measurements:  Height, weight, and BMI.  Blood pressure.  Lipid and cholesterol levels. These may be checked every 5 years, or more frequently if you are over 61 years old.  Skin check.  Lung cancer screening. You may have this screening every year starting at age 56 if you have a 30-pack-year history of smoking and currently smoke or have quit within the past 15 years.  Fecal occult blood test (FOBT) of the stool. You may have this test every year starting at age 36.  Flexible sigmoidoscopy or colonoscopy. You may have a sigmoidoscopy every 5 years or a colonoscopy every 10 years starting at age 21.  Prostate cancer screening. Recommendations will vary depending on your family history and other risks.  Hepatitis C blood test.  Hepatitis B blood test.  Sexually transmitted disease (STD) testing.  Diabetes screening. This is done by checking your blood sugar (glucose) after you have not eaten for a while (fasting). You may have this done every 1-3 years.  Abdominal aortic aneurysm (AAA) screening. You may need this if you are a current or former smoker.  Osteoporosis. You may be screened starting at age 20 if you are at high risk. Talk with your health care provider about your test results, treatment options, and if necessary, the need for more tests. Vaccines  Your health care provider may recommend certain vaccines,  such as:  Influenza vaccine. This is recommended every year.  Tetanus, diphtheria, and acellular pertussis (Tdap, Td) vaccine. You may need a Td booster every 10 years.  Zoster vaccine. You may need this after age 33.  Pneumococcal 13-valent conjugate  (PCV13) vaccine. One dose is recommended after age 21.  Pneumococcal polysaccharide (PPSV23) vaccine. One dose is recommended after age 107. Talk to your health care provider about which screenings and vaccines you need and how often you need them. This information is not intended to replace advice given to you by your health care provider. Make sure you discuss any questions you have with your health care provider. Document Released: 06/15/2015 Document Revised: 02/06/2016 Document Reviewed: 03/20/2015 Elsevier Interactive Patient Education  2017 [place] Prevention in the Home Falls can cause injuries. They can happen to people of all ages. There are many things you can do to make your home safe and to help prevent falls. What can I do on the outside of my home?  Regularly fix the edges of walkways and driveways and fix any cracks.  Remove anything that might make you trip as you walk through a door, such as a raised step or threshold.  Trim any bushes or trees on the path to your home.  Use bright outdoor lighting.  Clear any walking paths of anything that might make someone trip, such as rocks or tools.  Regularly check to see if handrails are loose or broken. Make sure that both sides of any steps have handrails.  Any raised decks and porches should have guardrails on the edges.  Have any leaves, snow, or ice cleared regularly.  Use sand or salt on walking paths during winter.  Clean up any spills in your garage right away. This includes oil or grease spills. What can I do in the bathroom?  Use night lights.  Install grab bars by the toilet and in the tub and shower. Do not use towel bars as grab bars.  Use non-skid mats or decals in the tub or shower.  If you need to sit down in the shower, use a plastic, non-slip stool.  Keep the floor dry. Clean up any water that spills on the floor as soon as it happens.  Remove soap buildup in the tub or shower  regularly.  Attach bath mats securely with double-sided non-slip rug tape.  Do not have throw rugs and other things on the floor that can make you trip. What can I do in the bedroom?  Use night lights.  Make sure that you have a light by your bed that is easy to reach.  Do not use any sheets or blankets that are too big for your bed. They should not hang down onto the floor.  Have a firm chair that has side arms. You can use this for support while you get dressed.  Do not have throw rugs and other things on the floor that can make you trip. What can I do in the kitchen?  Clean up any spills right away.  Avoid walking on wet floors.  Keep items that you use a lot in easy-to-reach places.  If you need to reach something above you, use a strong step stool that has a grab bar.  Keep electrical cords out of the way.  Do not use floor polish or wax that makes floors slippery. If you must use wax, use non-skid floor wax.  Do not have throw rugs and other things on  the floor that can make you trip. What can I do with my stairs?  Do not leave any items on the stairs.  Make sure that there are handrails on both sides of the stairs and use them. Fix handrails that are broken or loose. Make sure that handrails are as long as the stairways.  Check any carpeting to make sure that it is firmly attached to the stairs. Fix any carpet that is loose or worn.  Avoid having throw rugs at the top or bottom of the stairs. If you do have throw rugs, attach them to the floor with carpet tape.  Make sure that you have a light switch at the top of the stairs and the bottom of the stairs. If you do not have them, ask someone to add them for you. What else can I do to help prevent falls?  Wear shoes that:  Do not have high heels.  Have rubber bottoms.  Are comfortable and fit you well.  Are closed at the toe. Do not wear sandals.  If you use a stepladder:  Make sure that it is fully  opened. Do not climb a closed stepladder.  Make sure that both sides of the stepladder are locked into place.  Ask someone to hold it for you, if possible.  Clearly mark and make sure that you can see:  Any grab bars or handrails.  First and last steps.  Where the edge of each step is.  Use tools that help you move around (mobility aids) if they are needed. These include:  Canes.  Walkers.  Scooters.  Crutches.  Turn on the lights when you go into a dark area. Replace any light bulbs as soon as they burn out.  Set up your furniture so you have a clear path. Avoid moving your furniture around.  If any of your floors are uneven, fix them.  If there are any pets around you, be aware of where they are.  Review your medicines with your doctor. Some medicines can make you feel dizzy. This can increase your chance of falling. Ask your doctor what other things that you can do to help prevent falls. This information is not intended to replace advice given to you by your health care provider. Make sure you discuss any questions you have with your health care provider. Document Released: 03/15/2009 Document Revised: 10/25/2015 Document Reviewed: 06/23/2014 Elsevier Interactive Patient Education  2017 [employer].

## 2020-05-10 DIAGNOSIS — H40003 Preglaucoma, unspecified, bilateral: Secondary | ICD-10-CM | POA: Diagnosis not present

## 2020-05-16 DIAGNOSIS — I10 Essential (primary) hypertension: Secondary | ICD-10-CM | POA: Diagnosis not present

## 2020-05-16 DIAGNOSIS — R600 Localized edema: Secondary | ICD-10-CM | POA: Diagnosis not present

## 2020-05-16 DIAGNOSIS — N184 Chronic kidney disease, stage 4 (severe): Secondary | ICD-10-CM | POA: Diagnosis not present

## 2020-05-22 IMAGING — US US CAROTID DUPLEX BILAT
1 series · 13 of 24 positions shown · non-contrast
Comparison: 03/16/2012

CLINICAL DATA: Bilateral carotid artery stenosis

EXAM:
BILATERAL CAROTID DUPLEX ULTRASOUND
TECHNIQUE: Gray scale imaging, color Doppler and duplex ultrasound were
performed of bilateral carotid and vertebral arteries in the neck.

[Series 1: us carotid duplex bilat · 13 of 64 slices shown]
[im 1/64]
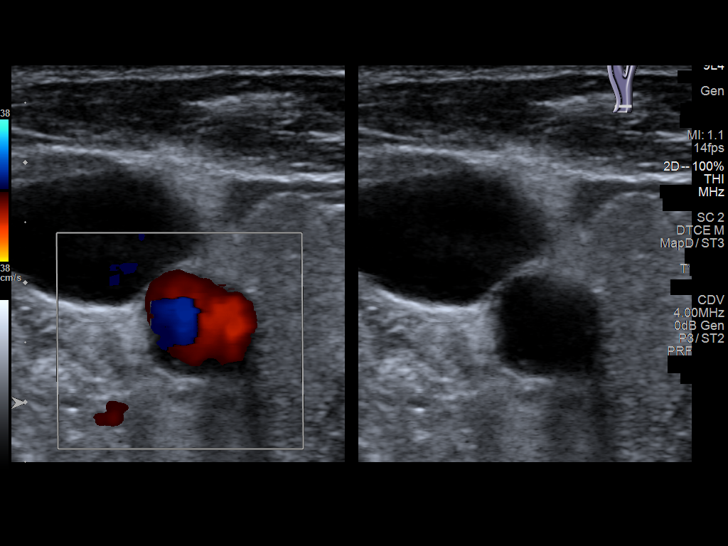
[im 6/64]
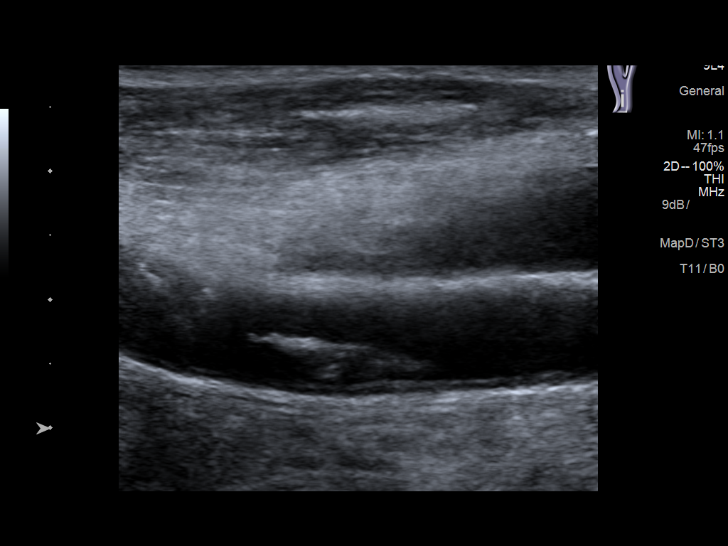
[im 11/64]
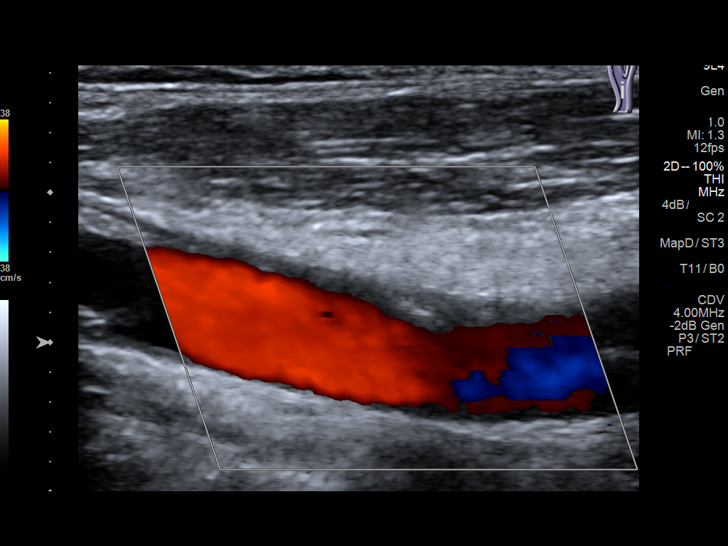
[im 17/64]
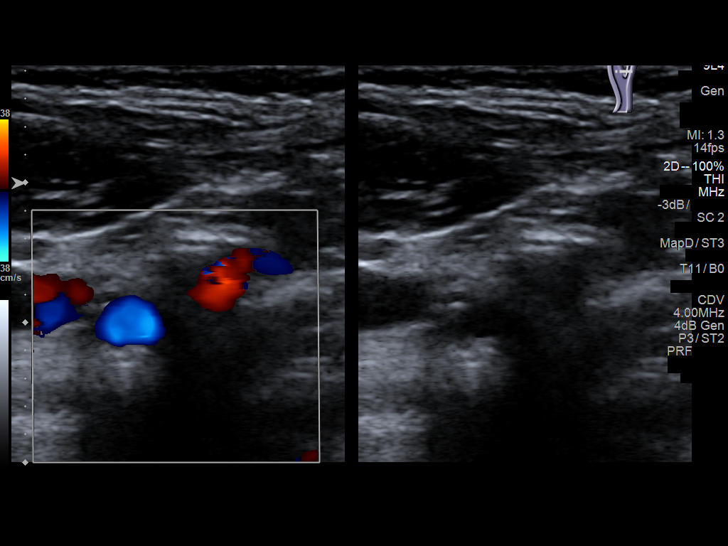
[im 22/64]
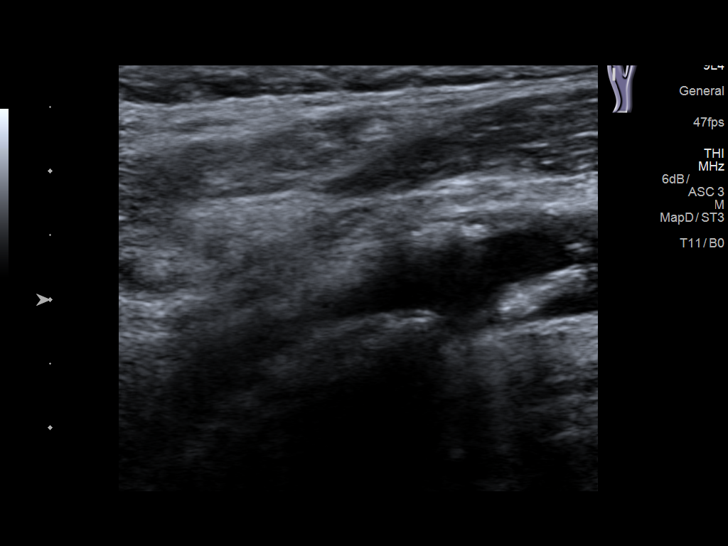
[im 28/64]
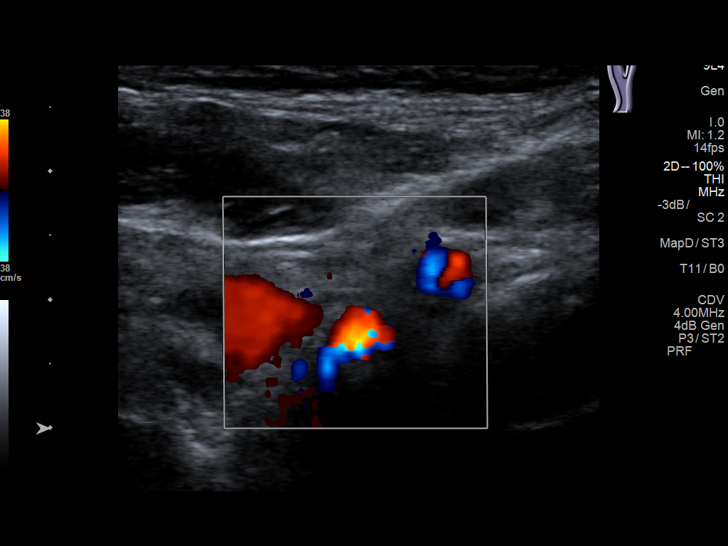
[im 33/64]
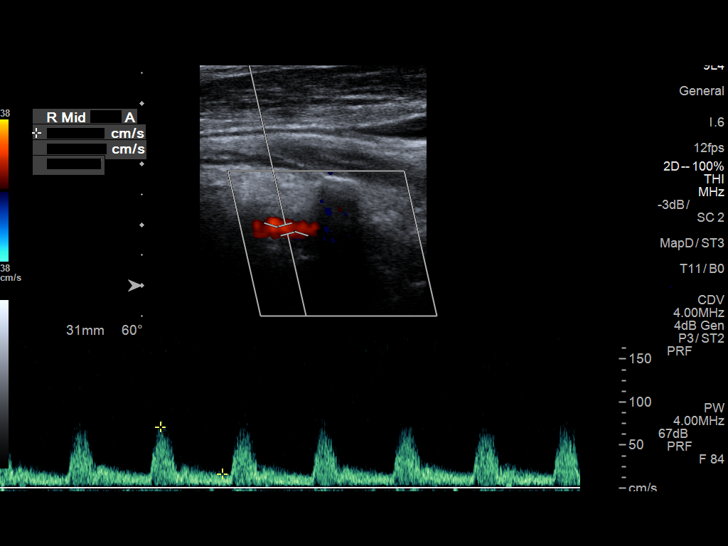
[im 36/64]
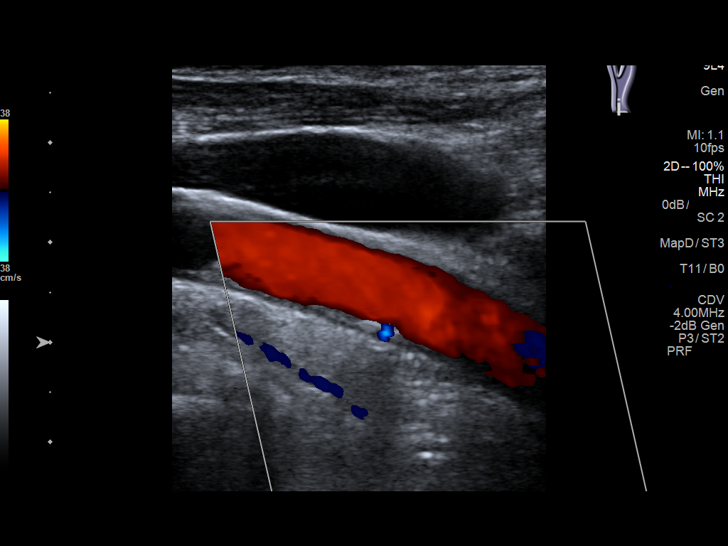
[im 42/64]
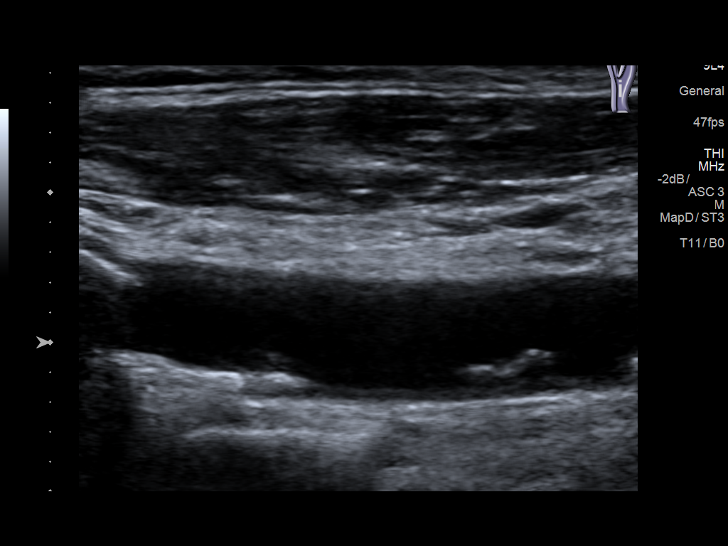
[im 47/64]
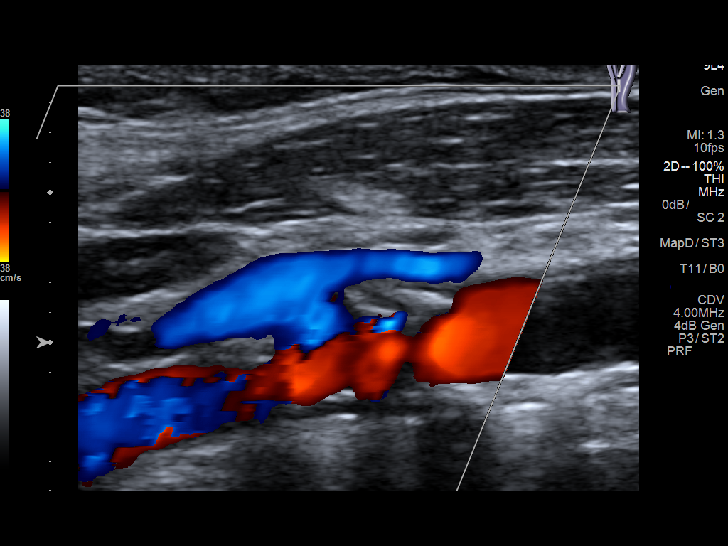
[im 53/64]
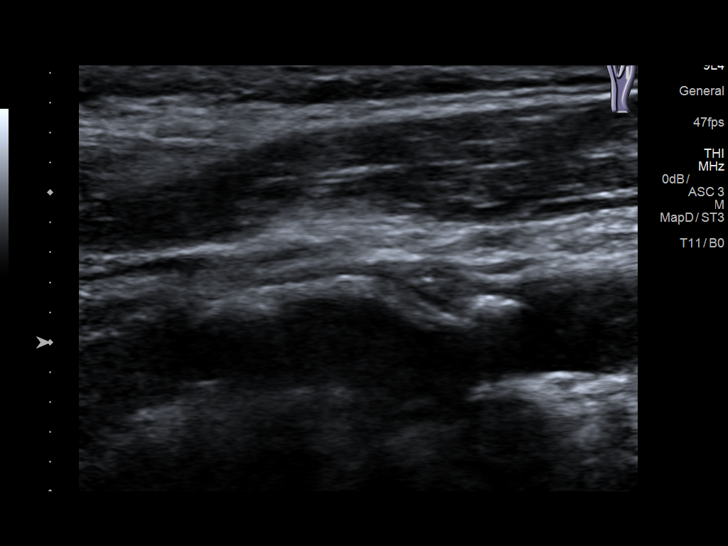
[im 58/64]
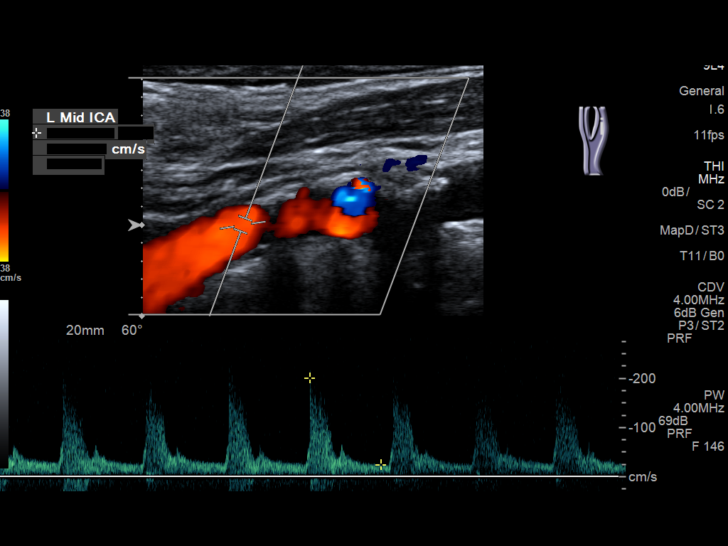
[im 64/64]
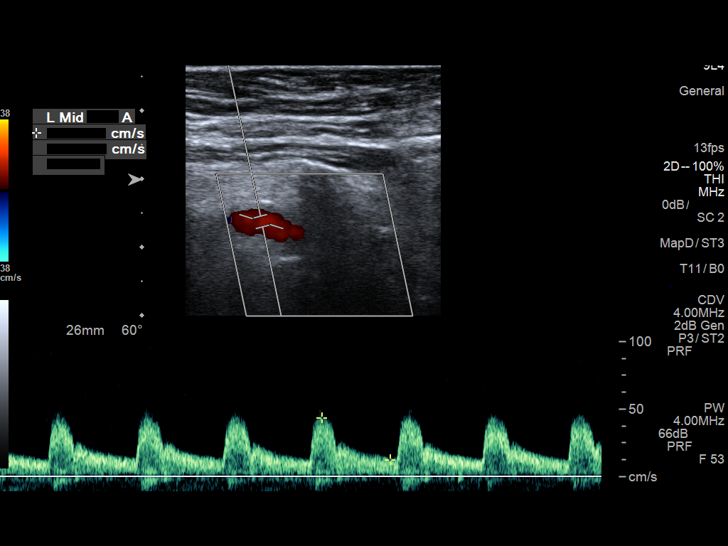

[13 of 24 positions shown; findings below may reference images not displayed]

FINDINGS: Criteria: Quantification of carotid stenosis is based on velocity
parameters that correlate the residual internal carotid diameter
with NASCET-based stenosis levels, using the diameter of the distal
internal carotid lumen as the denominator for stenosis measurement.

The following velocity measurements were obtained:

RIGHT

ICA:  240 cm/sec

CCA:  117 cm/sec

SYSTOLIC ICA/CCA RATIO:

DIASTOLIC ICA/CCA RATIO:

ECA:  271 cm/sec

LEFT

ICA:  235 cm/sec

CCA:  118 cm/sec

SYSTOLIC ICA/CCA RATIO:

DIASTOLIC ICA/CCA RATIO:

ECA:  345 cm/sec

RIGHT CAROTID ARTERY: There is irregular calcified plaque in the mid
and upper common carotid as well as the bulb. Low resistance
internal carotid Doppler pattern is preserved.

RIGHT VERTEBRAL ARTERY:  Antegrade.

LEFT CAROTID ARTERY: There is smooth calcified plaque in the mid
common carotid. There is extensive irregular calcified plaque in the
bulb. Low resistance internal carotid Doppler pattern.

LEFT VERTEBRAL ARTERY:  Antegrade.
IMPRESSION: Greater than 70% stenosis in the right and left internal carotid
arteries.

Antegrade vertebral arteries bilaterally.

## 2020-06-13 ENCOUNTER — Encounter: Payer: Self-pay | Admitting: Family Medicine

## 2020-06-13 ENCOUNTER — Ambulatory Visit (INDEPENDENT_AMBULATORY_CARE_PROVIDER_SITE_OTHER): Payer: PPO | Admitting: Family Medicine

## 2020-06-13 ENCOUNTER — Other Ambulatory Visit: Payer: Self-pay

## 2020-06-13 VITALS — BP 122/42 | HR 46 | Temp 97.8°F | Resp 15 | Wt 159.0 lb

## 2020-06-13 DIAGNOSIS — H353111 Nonexudative age-related macular degeneration, right eye, early dry stage: Secondary | ICD-10-CM | POA: Diagnosis not present

## 2020-06-13 DIAGNOSIS — I25110 Atherosclerotic heart disease of native coronary artery with unstable angina pectoris: Secondary | ICD-10-CM | POA: Diagnosis not present

## 2020-06-13 DIAGNOSIS — E785 Hyperlipidemia, unspecified: Secondary | ICD-10-CM

## 2020-06-13 DIAGNOSIS — I10 Essential (primary) hypertension: Secondary | ICD-10-CM

## 2020-06-13 DIAGNOSIS — I25119 Atherosclerotic heart disease of native coronary artery with unspecified angina pectoris: Secondary | ICD-10-CM

## 2020-06-13 DIAGNOSIS — R739 Hyperglycemia, unspecified: Secondary | ICD-10-CM

## 2020-06-13 NOTE — Progress Notes (Signed)
Established patient visit   Patient: Austin [name]   DOB: 10-Sep-1928   87 y.o. Male  MRN: [id number] Visit Date: 06/13/2020  Today's healthcare provider: Wilhemena Durie, MD   Chief Complaint  Patient presents with  . Hyperglycemia  . Hypertension  . Hyperlipidemia  . Coronary Artery Disease   Subjective    HPI  Everything is stable and patient feels well. Trying to remain active. Dr Lucky Cowboy Who follows his carotid disease. He has macular degeneration and his right eye. Prediabetes, Follow-up  Lab Results  Component Value Date   HGBA1C 5.9 (A) 02/07/2020   HGBA1C 5.7 09/29/2016   HGBA1C 5.4 02/27/2016   GLUCOSE 103 (H) 07/15/2019   GLUCOSE 109 (H) 07/14/2019   GLUCOSE 101 (H) 07/13/2019    Last seen for for this 4 months ago.  Management since that visit includes none. Current symptoms include polydipsia and visual disturbances and have been unchanged.  Prior visit with dietician: no Current diet: patient reports 2 meals a day and snack Current exercise: none  Pertinent Labs:    Component Value Date/Time   CHOL 134 11/23/2018 0915   TRIG 78 11/23/2018 0915   CHOLHDL 2.7 11/23/2018 0915   CHOLHDL 3.6 03/05/2017 1036   CREATININE 2.60 (H) 07/15/2019 0531   CREATININE 1.50 (H) 03/05/2017 1036    Wt Readings from Last 3 Encounters:  06/13/20 159 lb (72.1 kg)  02/07/20 157 lb 6.4 oz (71.4 kg)  01/04/20 156 lb 12.8 oz (71.1 kg)    ----------------------------------------------------------------------------------------- Hypertension, follow-up  BP Readings from Last 3 Encounters:  06/13/20 (!) 122/42  02/07/20 (!) 113/51  01/04/20 (!) 126/57   Wt Readings from Last 3 Encounters:  06/13/20 159 lb (72.1 kg)  02/07/20 157 lb 6.4 oz (71.4 kg)  01/04/20 156 lb 12.8 oz (71.1 kg)     He was last seen for hypertension 4 months ago.  BP at that visit was 113/51. Management since that visit includes none, condition well  Controlled .  He reports  excellent compliance with treatment. He is not having side effects.  He is following a Low Sodium diet. He is not exercising. He does not smoke.  Use of agents associated with hypertension: none.   Outside blood pressures are patient reports systolic ranging in the 196'Q and diastolic readings between 40-60. Symptoms: No chest pain No chest pressure  No palpitations No syncope  Yes dyspnea No orthopnea  No paroxysmal nocturnal dyspnea Yes lower extremity edema   Pertinent labs: Lab Results  Component Value Date   CHOL 134 11/23/2018   HDL 49 11/23/2018   LDLCALC 69 11/23/2018   TRIG 78 11/23/2018   CHOLHDL 2.7 11/23/2018   Lab Results  Component Value Date   NA 133 (L) 07/15/2019   K 4.1 07/15/2019   CREATININE 2.60 (H) 07/15/2019   GFRNONAA 21 (L) 07/15/2019   GFRAA 24 (L) 07/15/2019   GLUCOSE 103 (H) 07/15/2019     The ASCVD Risk score Mikey Bussing DC Jr., et al., 2013) failed to calculate for the following reasons:   The 2013 ASCVD risk score is only valid for ages 53 to 48   The patient has a prior MI or stroke diagnosis   --------------------------------------------------------------------------------------------------- Lipid/Cholesterol, Follow-up  Last lipid panel Other pertinent labs  Lab Results  Component Value Date   CHOL 134 11/23/2018   HDL 49 11/23/2018   LDLCALC 69 11/23/2018   TRIG 78 11/23/2018   CHOLHDL 2.7 11/23/2018  Lab Results  Component Value Date   ALT 24 07/09/2019   AST 22 07/09/2019   PLT 236 07/15/2019   TSH 1.590 11/23/2018     He was last seen for this 3 months ago.  Management since that visit includes none. Symptoms: No chest pain No chest pressure/discomfort  Yes dyspnea Yes lower extremity edema  No numbness or tingling of extremity No orthopnea  No palpitations No paroxysmal nocturnal dyspnea  No speech difficulty No syncope   Current diet: 2 meals a day and  1 snack Current exercise: none  The ASCVD Risk score ([place]., et al., 2013) failed to calculate for the following reasons:   The 2013 ASCVD risk score is only valid for ages 74 to 68   The patient has a prior MI or stroke diagnosis  --------------------------------------------------------------------------------------------------- Coronary artery disease, follow up  The patient was last seen for CAD on 02/07/20. Changes made at last visit include none. He is not taking daily aspirin, cardiologist made patient stop due to bruising  He reports excellent compliance with treatment. He is not having side effects.  He is not having to take nitroglycerine. He is experiencing none. Patient reports that he still has some shortness of breath on exertion He is not experiencing chest heaviness, chest pressure/discomfort or chest tightness. He is able to carry groceries,     is able to climb stairs,      is not able to cut grass,      is not able to work in the yard without having above symptoms.  His last vist with his cardiologist was 04/19/2020 with Dr. Nehemiah Massed.  Lipid Panel     Component Value Date/Time   CHOL 134 11/23/2018 0915   TRIG 78 11/23/2018 0915   HDL 49 11/23/2018 0915   CHOLHDL 2.7 11/23/2018 0915   CHOLHDL 3.6 03/05/2017 1036   LDLCALC 69 11/23/2018 0915   LDLCALC 90 03/05/2017 1829   Last metabolic panel Lab Results  Component Value Date   GLUCOSE 103 (H) 07/15/2019   NA 133 (L) 07/15/2019   K 4.1 07/15/2019   CL 103 07/15/2019   CO2 18 (L) 07/15/2019   BUN 62 (H) 07/15/2019   CREATININE 2.60 (H) 07/15/2019   GFRNONAA 21 (L) 07/15/2019   GFRAA 24 (L) 07/15/2019   CALCIUM 8.8 (L) 07/15/2019   PHOS 5.1 (H) 07/11/2019   PROT 7.8 07/09/2019   ALBUMIN 4.1 07/09/2019   LABGLOB 2.3 11/23/2018   AGRATIO 2.0 11/23/2018   BILITOT 1.9 (H) 07/09/2019   ALKPHOS 82 07/09/2019   AST 22 07/09/2019   ALT 24 07/09/2019   ANIONGAP 12 07/15/2019     -----------------------------------------------------------------------------------------      Medications: Outpatient Medications Prior to Visit  Medication Sig  . amiodarone (PACERONE) 200 MG tablet Take 1 tablet (200 mg total) by mouth daily.  Marland Kitchen amLODipine (NORVASC) 5 MG tablet Take 5 mg by mouth daily.  Marland Kitchen apixaban (ELIQUIS) 2.5 MG TABS tablet Take 1 tablet (2.5 mg total) by mouth 2 (two) times daily.  Marland Kitchen aspirin EC 81 MG EC tablet Take 1 tablet (81 mg total) by mouth daily. (Patient not taking: Reported on 05/08/2020)  . atorvastatin (LIPITOR) 20 MG tablet Take 20 mg by mouth daily.   . Biotin 5000 MCG CAPS Take 5,000 mcg by mouth daily.   . Cholecalciferol (VITAMIN D3) 2000 UNITS capsule Take 2,000 Units by mouth 2 (two) times daily.   . Coenzyme Q10 (COQ-10) 200 MG CAPS Take  200 mg by mouth daily.  . dorzolamide-timolol (COSOPT) 22.3-6.8 MG/ML ophthalmic solution Place 1 drop into both eyes 2 (two) times daily.   . Glucosamine HCl (GLUCOSAMINE PO) Take 1 tablet by mouth 2 (two) times daily. (Patient not taking: Reported on 01/04/2020)  . isosorbide mononitrate (IMDUR) 60 MG 24 hr tablet Take 60 mg by mouth daily.   . Liniments (SALONPAS PAIN RELIEF PATCH EX) Apply 1 patch topically daily as needed (for pain). (Patient not taking: Reported on 05/08/2020)  . Melatonin 5 MG CAPS Take 5 mg by mouth at bedtime as needed (for sleep).   . Multiple Vitamins-Minerals (PRESERVISION AREDS 2) CAPS Take 1 capsule by mouth 2 (two) times daily.  . nitroGLYCERIN (NITROSTAT) 0.4 MG SL tablet Place 0.4 mg under the tongue every 5 (five) minutes as needed for chest pain.   . Omega-3 Fatty Acids (FISH OIL) 1200 MG CAPS Take 1,200 mg by mouth daily.  (Patient not taking: Reported on 05/08/2020)  . omeprazole (PRILOSEC) 20 MG capsule Take 20 mg by mouth daily.   Vladimir Faster Glycol-Propyl Glycol (SYSTANE OP) Place 1 drop into the right eye daily as needed (for blurriness).   . tamsulosin (FLOMAX) 0.4 MG CAPS  capsule TAKE 1 CAPSULE BY MOUTH ONCE DAILY  . torsemide (DEMADEX) 20 MG tablet Take 20 mg by mouth daily.  . Travoprost, BAK Free, (TRAVATAN Z) 0.004 % SOLN ophthalmic solution Place 1 drop into both eyes at bedtime.   . Turmeric 500 MG TABS Take 1,000 mg by mouth daily.  . vitamin B-12 (CYANOCOBALAMIN) 1000 MCG tablet Take 1,000 mcg by mouth daily.   No facility-administered medications prior to visit.    Review of Systems  Last lipids Lab Results  Component Value Date   CHOL 140 06/20/2020   HDL 42 06/20/2020   LDLCALC 84 06/20/2020   TRIG 72 06/20/2020   CHOLHDL 3.3 06/20/2020      Objective    BP (!) 122/42   Pulse (!) 46   Temp 97.8 F (36.6 C) (Oral)   Resp 15   Wt 159 lb (72.1 kg)   SpO2 100%   BMI 25.66 kg/m  BP Readings from Last 3 Encounters:  06/13/20 (!) 122/42  02/07/20 (!) 113/51  01/04/20 (!) 126/57   Wt Readings from Last 3 Encounters:  06/13/20 159 lb (72.1 kg)  02/07/20 157 lb 6.4 oz (71.4 kg)  01/04/20 156 lb 12.8 oz (71.1 kg)      Physical Exam Vitals reviewed.  Constitutional:      Appearance: Normal appearance.  HENT:     Head: Normocephalic and atraumatic.     Right Ear: External ear normal.     Left Ear: External ear normal.  Eyes:     General: No scleral icterus.    Conjunctiva/sclera: Conjunctivae normal.  Cardiovascular:     Rate and Rhythm: Normal rate and regular rhythm.     Pulses: Normal pulses.     Heart sounds: Normal heart sounds.  Pulmonary:     Effort: Pulmonary effort is normal.     Breath sounds: Normal breath sounds.  Abdominal:     Palpations: Abdomen is soft.  Musculoskeletal:     Right lower leg: No edema.     Left lower leg: No edema.  Skin:    General: Skin is warm and dry.  Neurological:     General: No focal deficit present.     Mental Status: He is alert and oriented to person, place, and time.  Psychiatric:        Mood and Affect: Mood normal.        Behavior: Behavior normal.        Thought  Content: Thought content normal.        Judgment: Judgment normal.       No results found for any visits on 06/13/20.  Assessment & Plan     1. Benign essential HTN BP this summer. Good control.  No hypotension or syncopal symptoms. - TSH  2. Hyperlipidemia, unspecified hyperlipidemia type On atorvastatin 20, last LDL 69 - Hemoglobin A1c - Lipid panel  3. Atherosclerosis of coronary artery with angina pectoris, unspecified vessel or lesion type, unspecified whether native or transplanted heart ([place]) With carotid disease. - Hepatic function panel  4. Coronary artery disease involving native coronary artery of native heart with unstable angina pectoris ([place]) All risk factors treated. - TSH  5. Hyperglycemia  - Hemoglobin A1c  6. Early dry stage nonexudative age-related macular degeneration of right eye    No follow-ups on file.      I, Wilhemena Durie, MD, have reviewed all documentation for this visit. The documentation on 06/23/20 for the exam, diagnosis, procedures, and orders are all accurate and complete.     Cranford Mon, MD  [facility] [phone] (phone) [phone] (fax)  [place]

## 2020-06-14 DIAGNOSIS — H40001 Preglaucoma, unspecified, right eye: Secondary | ICD-10-CM | POA: Diagnosis not present

## 2020-06-20 DIAGNOSIS — R739 Hyperglycemia, unspecified: Secondary | ICD-10-CM | POA: Diagnosis not present

## 2020-06-20 DIAGNOSIS — I10 Essential (primary) hypertension: Secondary | ICD-10-CM | POA: Diagnosis not present

## 2020-06-20 DIAGNOSIS — E785 Hyperlipidemia, unspecified: Secondary | ICD-10-CM | POA: Diagnosis not present

## 2020-06-20 DIAGNOSIS — I25119 Atherosclerotic heart disease of native coronary artery with unspecified angina pectoris: Secondary | ICD-10-CM | POA: Diagnosis not present

## 2020-06-20 DIAGNOSIS — I25110 Atherosclerotic heart disease of native coronary artery with unstable angina pectoris: Secondary | ICD-10-CM | POA: Diagnosis not present

## 2020-06-21 LAB — HEPATIC FUNCTION PANEL
ALT: 14 IU/L (ref 0–44)
AST: 14 IU/L (ref 0–40)
Albumin: 4.2 g/dL (ref 3.6–4.6)
Alkaline Phosphatase: 131 IU/L — ABNORMAL HIGH (ref 44–121)
Bilirubin Total: 0.6 mg/dL (ref 0.0–1.2)
Bilirubin, Direct: 0.21 mg/dL (ref 0.00–0.40)
Total Protein: 6.8 g/dL (ref 6.0–8.5)

## 2020-06-21 LAB — HEMOGLOBIN A1C
Est. average glucose Bld gHb Est-mCnc: 114 mg/dL
Hgb A1c MFr Bld: 5.6 % (ref 4.8–5.6)

## 2020-06-21 LAB — LIPID PANEL
Chol/HDL Ratio: 3.3 ratio (ref 0.0–5.0)
Cholesterol, Total: 140 mg/dL (ref 100–199)
HDL: 42 mg/dL (ref >39)
LDL Chol Calc (NIH): 84 mg/dL (ref 0–99)
Triglycerides: 72 mg/dL (ref 0–149)
VLDL Cholesterol Cal: 14 mg/dL (ref 5–40)

## 2020-06-21 LAB — TSH: TSH: 9.33 u[IU]/mL — ABNORMAL HIGH (ref 0.450–4.500)

## 2020-06-26 ENCOUNTER — Telehealth: Payer: Self-pay

## 2020-06-26 NOTE — Telephone Encounter (Signed)
Pt advised.   Thanks,   -Charnetta Wulff  

## 2020-06-26 NOTE — Telephone Encounter (Signed)
-----   Message from Jerrol Banana., MD sent at 06/22/2020  4:28 PM EST ----- Labs stable.  TSH on next office visit.

## 2020-07-03 DIAGNOSIS — H353211 Exudative age-related macular degeneration, right eye, with active choroidal neovascularization: Secondary | ICD-10-CM | POA: Diagnosis not present

## 2020-07-04 ENCOUNTER — Other Ambulatory Visit (INDEPENDENT_AMBULATORY_CARE_PROVIDER_SITE_OTHER): Payer: Self-pay | Admitting: Nurse Practitioner

## 2020-07-04 DIAGNOSIS — I6523 Occlusion and stenosis of bilateral carotid arteries: Secondary | ICD-10-CM

## 2020-07-06 ENCOUNTER — Other Ambulatory Visit: Payer: Self-pay

## 2020-07-06 ENCOUNTER — Ambulatory Visit (INDEPENDENT_AMBULATORY_CARE_PROVIDER_SITE_OTHER): Payer: PPO | Admitting: Vascular Surgery

## 2020-07-06 ENCOUNTER — Encounter (INDEPENDENT_AMBULATORY_CARE_PROVIDER_SITE_OTHER): Payer: Self-pay | Admitting: Vascular Surgery

## 2020-07-06 ENCOUNTER — Ambulatory Visit (INDEPENDENT_AMBULATORY_CARE_PROVIDER_SITE_OTHER): Payer: PPO

## 2020-07-06 VITALS — BP 121/57 | HR 57 | Ht 66.0 in | Wt 154.0 lb

## 2020-07-06 DIAGNOSIS — I10 Essential (primary) hypertension: Secondary | ICD-10-CM | POA: Diagnosis not present

## 2020-07-06 DIAGNOSIS — I6523 Occlusion and stenosis of bilateral carotid arteries: Secondary | ICD-10-CM

## 2020-07-06 DIAGNOSIS — N1832 Chronic kidney disease, stage 3b: Secondary | ICD-10-CM

## 2020-07-06 DIAGNOSIS — E785 Hyperlipidemia, unspecified: Secondary | ICD-10-CM

## 2020-07-06 NOTE — Assessment & Plan Note (Signed)
blood pressure control important in reducing the progression of atherosclerotic disease. On appropriate oral medications.  

## 2020-07-06 NOTE — Assessment & Plan Note (Signed)
lipid control important in reducing the progression of atherosclerotic disease. Continue statin therapy  

## 2020-07-06 NOTE — Progress Notes (Signed)
MRN : [id number]  Austin [name] is a 87 y.o. ([birth year]) male who presents with chief complaint of  Chief Complaint  Patient presents with  . Follow-up  . Carotid  .  History of Present Illness: Patient returns in follow-up of his carotid disease.  He is doing well today.  He has no complaints.  He denies any focal neurologic symptoms. Specifically, the patient denies amaurosis fugax, speech or swallowing difficulties, or arm or leg weakness or numbness.  Carotid duplex demonstrates stable 60 to 79% bilateral ICA stenosis.  Current Outpatient Medications  Medication Sig Dispense Refill  . amiodarone (PACERONE) 200 MG tablet Take 1 tablet (200 mg total) by mouth daily. 30 tablet 0  . amLODipine (NORVASC) 5 MG tablet Take 2.5 mg by mouth daily.    Marland Kitchen apixaban (ELIQUIS) 2.5 MG TABS tablet Take 1 tablet (2.5 mg total) by mouth 2 (two) times daily. 60 tablet 0  . aspirin EC 81 MG EC tablet Take 1 tablet (81 mg total) by mouth daily. (Patient not taking: No sig reported) 90 tablet 0  . atorvastatin (LIPITOR) 20 MG tablet Take 20 mg by mouth daily.     . Biotin 5000 MCG CAPS Take 5,000 mcg by mouth daily.     . Cholecalciferol (VITAMIN D3) 2000 UNITS capsule Take 2,000 Units by mouth 2 (two) times daily.     . Coenzyme Q10 (COQ-10) 200 MG CAPS Take 200 mg by mouth daily.    . dorzolamide-timolol (COSOPT) 22.3-6.8 MG/ML ophthalmic solution Place 1 drop into both eyes 2 (two) times daily.     . Glucosamine HCl (GLUCOSAMINE PO) Take 1 tablet by mouth 2 (two) times daily.    . isosorbide mononitrate (IMDUR) 60 MG 24 hr tablet Take 60 mg by mouth daily.     . Liniments (SALONPAS PAIN RELIEF PATCH EX) Apply 1 patch topically daily as needed (for pain).    . Melatonin 5 MG CAPS Take 5 mg by mouth at bedtime as needed (for sleep).     . Multiple Vitamins-Minerals (PRESERVISION AREDS 2) CAPS Take 1 capsule by mouth 2 (two) times daily.    . nitroGLYCERIN (NITROSTAT) 0.4 MG SL tablet Place 0.4 mg  under the tongue every 5 (five) minutes as needed for chest pain.     . Omega-3 Fatty Acids (FISH OIL) 1200 MG CAPS Take 1,200 mg by mouth daily.    Marland Kitchen omeprazole (PRILOSEC) 20 MG capsule Take 20 mg by mouth daily.     Vladimir Faster Glycol-Propyl Glycol (SYSTANE OP) Place 1 drop into the right eye daily as needed (for blurriness).     . tamsulosin (FLOMAX) 0.4 MG CAPS capsule TAKE 1 CAPSULE BY MOUTH ONCE DAILY 30 capsule 11  . torsemide (DEMADEX) 20 MG tablet Take 20 mg by mouth daily.    . Travoprost, BAK Free, (TRAVATAN) 0.004 % SOLN ophthalmic solution Place 1 drop into both eyes at bedtime.     . Turmeric 500 MG TABS Take 1,000 mg by mouth daily.    . vitamin B-12 (CYANOCOBALAMIN) 1000 MCG tablet Take 1,000 mcg by mouth daily.     No current facility-administered medications for this visit.    Past Medical History:  Diagnosis Date  . Absolute anemia 12/08/2014  . Allergy   . Anginal pain ([place])    much less angina d/t lack of activity  . Arthritis    all over body  . Bradycardia 05/03/2015  . Calculus of kidney 12/08/2014  .  Coronary artery disease   . GERD (gastroesophageal reflux disease)   . Glaucoma   . History of kidney stones   . HLD (hyperlipidemia)   . Hyperglycemia 08/27/2015  . Hypertension   . Macular degeneration   . Myocardial infarction ([place]) 2011  . Peripheral vascular disease ([place])   . VHD (valvular heart disease)     Past Surgical History:  Procedure Laterality Date  . ANTERIOR CERVICAL DECOMP/DISCECTOMY FUSION N/A 12/30/2017   Procedure: ANTERIOR CERVICAL DECOMPRESSION/DISCECTOMY FUSION 3 LEVELS- C3-6;  Surgeon: Meade Maw, MD;  Location: [facility];  Service: Neurosurgery;  Laterality: N/A;  . CARDIAC CATHETERIZATION  2007  . CARDIAC CATHETERIZATION N/A 04/17/2015   Procedure: Left Heart Cath and Coronary Angiography;  Surgeon: Corey Skains, MD;  Location: [facility];  Service: Cardiovascular;  Laterality: N/A;  . CARPAL TUNNEL RELEASE  Bilateral    right 06/09/08 left 05/03/09  . CATARACT EXTRACTION W/ INTRAOCULAR LENS  IMPLANT, BILATERAL    . COLONOSCOPY N/A 10/02/2014   Procedure: COLONOSCOPY;  Surgeon: Manya Silvas, MD;  Location: [facility];  Service: Endoscopy;  Laterality: N/A;  . CORONARY ARTERY BYPASS GRAFT  2007  . ESOPHAGOGASTRODUODENOSCOPY    . EYE SURGERY    . HEMORROIDECTOMY    . JOINT REPLACEMENT Right 03/2017   partial knee replacement  . KNEE ARTHROSCOPY Right 08/05/2016   Procedure: ARTHROSCOPY KNEE;  Surgeon: Thornton Park, MD;  Location: [facility];  Service: Orthopedics;  Laterality: Right;  . KNEE ARTHROSCOPY WITH LATERAL RELEASE Right 08/05/2016   Procedure: KNEE ARTHROSCOPY WITH LATERAL RELEASE;  Surgeon: Thornton Park, MD;  Location: [facility];  Service: Orthopedics;  Laterality: Right;  . KNEE ARTHROSCOPY WITH MEDIAL MENISECTOMY Right 08/05/2016   Procedure: KNEE ARTHROSCOPY WITH MEDIAL MENISECTOMY;  Surgeon: Thornton Park, MD;  Location: [facility];  Service: Orthopedics;  Laterality: Right;  Partial  . LUMBAR EPIDURAL INJECTION  2011  . LUMBAR LAMINECTOMY/DECOMPRESSION MICRODISCECTOMY N/A 03/10/2018   Procedure: LUMBAR LAMINECTOMY/DECOMPRESSION MICRODISCECTOMY 3 LEVELS-L2-5;  Surgeon: Meade Maw, MD;  Location: [facility];  Service: Neurosurgery;  Laterality: N/A;  . MOUTH SURGERY     tooth implant  . SHOULDER SURGERY Right 2011   rotator cuff repair  . SYNOVECTOMY Right 08/05/2016   Procedure: SYNOVECTOMY;  Surgeon: Thornton Park, MD;  Location: [facility];  Service: Orthopedics;  Laterality: Right;  arthroscopic extensive synovectomy  . TONSILLECTOMY       Social History   Tobacco Use  . Smoking status: Former Smoker    Years: 15.00    Types: Cigarettes    Quit date: 1990    Years since quitting: 32.1  . Smokeless tobacco: Never Used  Vaping Use  . Vaping Use: Never used  Substance Use Topics  . Alcohol use: Yes    Alcohol/week: 2.0 standard drinks    Types: 2 Glasses of  wine per week  . Drug use: No      Family History  Problem Relation Age of Onset  . Heart disease Mother   . Heart disease Father   . Heart disease Brother   . Heart disease Brother   . Heart disease Brother   . Heart disease Brother   . Heart disease Brother     Allergies  Allergen Reactions  . Penicillins Itching, Swelling, Rash and Other (See Comments)    Has patient had a PCN reaction causing immediate rash, facial/tongue/throat swelling, SOB or lightheadedness with hypotension: Yes Has patient had a PCN reaction causing severe rash involving mucus  membranes or skin necrosis: No Has patient had a PCN reaction that required hospitalization No Has patient had a PCN reaction occurring within the last 10 years: No If all of the above answers are "NO", then may proceed with Cephalosporin use.  Other reaction(s): Other (see comments) Has patient had a PCN reaction causing immediate rash, facial/tongue/throat swelling, SOB or lightheadedness with hypotension: Yes Has patient had a PCN reaction causing severe rash involving mucus membranes or skin necrosis: No Has patient had a PCN reaction that required hospitalization No Has patient had a PCN reaction occurring within the last 10 years: No If all of the above answers are "NO", then may proceed with Cephalosporin use. Has patient had a PCN reaction causing immediate rash, facial/tongue/throat swelling, SOB or lightheadedness with hypotension: Yes Has patient had a PCN reaction causing severe rash involving mucus membranes or skin necrosis: No Has patient had a PCN reaction that required hospitalization No Has patient had a PCN reaction occurring within the last 10 years: No If all of the above answers are "NO", then may proceed with Cephalosporin use. Has patient had a PCN reaction causing immediate rash, facial/tongue/throat swelling, SOB or lightheadedness with hypotension: Yes Has patient had a PCN reaction causing severe rash  involving mucus membranes or skin necrosis: No Has patient had a PCN reaction that required hospitalization No Has patient had a PCN reaction occurring within the last 10 years: No If all of the above answers are "NO", then may proceed with Cephalosporin use.     REVIEW OF SYSTEMS (Negative unless checked)  Constitutional: [] Weight loss  [] Fever  [] Chills Cardiac: [x] Chest pain   [] Chest pressure   [] Palpitations   [] Shortness of breath when laying flat   [] Shortness of breath at rest   [] Shortness of breath with exertion. Vascular:  [] Pain in legs with walking   [] Pain in legs at rest   [] Pain in legs when laying flat   [] Claudication   [] Pain in feet when walking  [] Pain in feet at rest  [] Pain in feet when laying flat   [] History of DVT   [] Phlebitis   [] Swelling in legs   [] Varicose veins   [] Non-healing ulcers Pulmonary:   [] Uses home oxygen   [] Productive cough   [] Hemoptysis   [] Wheeze  [] COPD   [] Asthma Neurologic:  [] Dizziness  [] Blackouts   [] Seizures   [] History of stroke   [] History of TIA  [] Aphasia   [] Temporary blindness   [] Dysphagia   [] Weakness or numbness in arms   [] Weakness or numbness in legs Musculoskeletal:  [x] Arthritis   [] Joint swelling   [] Joint pain   [] Low back pain Hematologic:  [] Easy bruising  [] Easy bleeding   [] Hypercoagulable state   [x] Anemic  [] Hepatitis Gastrointestinal:  [] Blood in stool   [] Vomiting blood  [x] Gastroesophageal reflux/heartburn   [] Difficulty swallowing. Genitourinary:  [] Chronic kidney disease   [] Difficult urination  [] Frequent urination  [] Burning with urination   [] Blood in urine Skin:  [] Rashes   [] Ulcers   [] Wounds Psychological:  [] History of anxiety   []  History of major depression.  Physical Examination  Vitals:   07/06/20 1108  BP: (!) 121/57  Pulse: (!) 57  Weight: 154 lb (69.9 kg)  Height: 5\' 6"  (1.676 m)   Body mass index is 24.86 kg/m. Gen:  WD/WN, NAD. Appears younger than stated age. Head: /AT, No temporalis  wasting. Ear/Nose/Throat: Hearing grossly intact, nares w/o erythema or drainage, trachea midline Eyes: Conjunctiva clear. Sclera non-icteric Neck: Supple.  Soft bilateral  bruit  Pulmonary:  Good air movement, equal and clear to auscultation bilaterally.  Cardiac: RRR, No JVD Vascular:  Vessel Right Left  Radial Palpable Palpable       Musculoskeletal: M/S 5/5 throughout.  No deformity or atrophy. No edema. Neurologic: CN 2-12 intact. Sensation grossly intact in extremities.  Symmetrical.  Speech is fluent. Motor exam as listed above. Psychiatric: Judgment intact, Mood & affect appropriate for pt's clinical situation. Dermatologic: No rashes or ulcers noted.  No cellulitis or open wounds.      CBC Lab Results  Component Value Date   WBC 6.9 07/15/2019   HGB 11.7 (L) 07/15/2019   HCT 34.7 (L) 07/15/2019   MCV 89.9 07/15/2019   PLT 236 07/15/2019    BMET    Component Value Date/Time   NA 133 (L) 07/15/2019 0531   NA 137 11/23/2018 0915   NA 137 05/09/2013 0912   K 4.1 07/15/2019 0531   K 4.4 05/09/2013 0912   CL 103 07/15/2019 0531   CL 103 05/09/2013 0912   CO2 18 (L) 07/15/2019 0531   CO2 29 05/09/2013 0912   GLUCOSE 103 (H) 07/15/2019 0531   GLUCOSE 93 05/09/2013 0912   BUN 62 (H) 07/15/2019 0531   BUN 25 11/23/2018 0915   BUN 26 (H) 05/09/2013 0912   CREATININE 2.60 (H) 07/15/2019 0531   CREATININE 1.50 (H) 03/05/2017 1036   CALCIUM 8.8 (L) 07/15/2019 0531   CALCIUM 9.2 05/09/2013 0912   GFRNONAA 21 (L) 07/15/2019 0531   GFRNONAA 43 (L) 05/09/2013 0912   GFRAA 24 (L) 07/15/2019 0531   GFRAA 50 (L) 05/09/2013 0912   CrCl cannot be calculated (Patient's most recent lab result is older than the maximum 21 days allowed.).  COAG Lab Results  Component Value Date   INR 1.3 (H) 07/09/2019   INR 1.04 03/02/2018   INR 1.10 12/23/2017    Radiology No results found.   Assessment/Plan Benign essential HTN blood pressure control important in reducing the  progression of atherosclerotic disease. On appropriate oral medications.   Stage 3b chronic kidney disease Limit contrast.  His most recent GFR was down into the 20s.  CT angiogram would not be prudent at any point, and if we need further evaluation a catheter-based angiogram will need to be considered.  HLD (hyperlipidemia) lipid control important in reducing the progression of atherosclerotic disease. Continue statin therapy   Carotid stenosis Carotid duplex demonstrates stable 60 to 79% bilateral ICA stenosis.  Continue current medical regimen.  Recheck in 1 year.    Leotis Pain, MD  07/06/2020 12:22 PM    This note was created with Dragon medical transcription system.  Any errors from dictation are purely unintentional

## 2020-07-06 NOTE — Assessment & Plan Note (Signed)
Carotid duplex demonstrates stable 60 to 79% bilateral ICA stenosis.  Continue current medical regimen.  Recheck in 1 year.

## 2020-07-06 NOTE — Assessment & Plan Note (Signed)
Limit contrast.  His most recent GFR was down into the 20s.  CT angiogram would not be prudent at any point, and if we need further evaluation a catheter-based angiogram will need to be considered.

## 2020-07-23 DIAGNOSIS — N184 Chronic kidney disease, stage 4 (severe): Secondary | ICD-10-CM | POA: Diagnosis not present

## 2020-07-30 DIAGNOSIS — R600 Localized edema: Secondary | ICD-10-CM | POA: Diagnosis not present

## 2020-07-30 DIAGNOSIS — N184 Chronic kidney disease, stage 4 (severe): Secondary | ICD-10-CM | POA: Diagnosis not present

## 2020-07-30 DIAGNOSIS — I10 Essential (primary) hypertension: Secondary | ICD-10-CM | POA: Diagnosis not present

## 2020-08-14 DIAGNOSIS — H353211 Exudative age-related macular degeneration, right eye, with active choroidal neovascularization: Secondary | ICD-10-CM | POA: Diagnosis not present

## 2020-08-15 DIAGNOSIS — Z79899 Other long term (current) drug therapy: Secondary | ICD-10-CM | POA: Diagnosis not present

## 2020-08-15 DIAGNOSIS — I5032 Chronic diastolic (congestive) heart failure: Secondary | ICD-10-CM | POA: Diagnosis not present

## 2020-08-15 DIAGNOSIS — E782 Mixed hyperlipidemia: Secondary | ICD-10-CM | POA: Diagnosis not present

## 2020-08-15 DIAGNOSIS — I4892 Unspecified atrial flutter: Secondary | ICD-10-CM | POA: Diagnosis not present

## 2020-08-15 DIAGNOSIS — I25708 Atherosclerosis of coronary artery bypass graft(s), unspecified, with other forms of angina pectoris: Secondary | ICD-10-CM | POA: Diagnosis not present

## 2020-08-15 DIAGNOSIS — I10 Essential (primary) hypertension: Secondary | ICD-10-CM | POA: Diagnosis not present

## 2020-08-30 DIAGNOSIS — I4892 Unspecified atrial flutter: Secondary | ICD-10-CM | POA: Diagnosis not present

## 2020-09-25 DIAGNOSIS — H353211 Exudative age-related macular degeneration, right eye, with active choroidal neovascularization: Secondary | ICD-10-CM | POA: Diagnosis not present

## 2020-10-12 DIAGNOSIS — H40001 Preglaucoma, unspecified, right eye: Secondary | ICD-10-CM | POA: Diagnosis not present

## 2020-10-30 DIAGNOSIS — H353211 Exudative age-related macular degeneration, right eye, with active choroidal neovascularization: Secondary | ICD-10-CM | POA: Diagnosis not present

## 2020-11-03 ENCOUNTER — Other Ambulatory Visit: Payer: Self-pay | Admitting: Family Medicine

## 2020-11-03 NOTE — Telephone Encounter (Signed)
Requested Prescriptions  Pending Prescriptions Disp Refills  . tamsulosin (FLOMAX) 0.4 MG CAPS capsule [Pharmacy Med Name: TAMSULOSIN HCL 0.4 MG CAP] 90 capsule 0    Sig: TAKE 1 CAPSULE BY MOUTH ONCE DAILY     Urology: Alpha-Adrenergic Blocker Passed - 11/03/2020  8:14 AM      Passed - Last BP in normal range    BP Readings from Last 1 Encounters:  07/06/20 (!) 121/57         Passed - Valid encounter within last 12 months    Recent Outpatient Visits          4 months ago Benign essential HTN   [facility] Jerrol Banana., MD   9 months ago Benign essential HTN   [facility] Jerrol Banana., MD   1 year ago Coronary artery disease involving native coronary artery of native heart with unstable angina pectoris [facility])   [facility] Jerrol Banana., MD   1 year ago Coronary artery disease involving native coronary artery of native heart with unstable angina pectoris [facility])   [facility] Jerrol Banana., MD   1 year ago Viral URI with cough   [facility] [place], [place], [place]      Future Appointments            In 1 month Jerrol Banana., MD [facility], [place]

## 2020-11-30 DIAGNOSIS — H401122 Primary open-angle glaucoma, left eye, moderate stage: Secondary | ICD-10-CM | POA: Diagnosis not present

## 2020-12-04 DIAGNOSIS — I4892 Unspecified atrial flutter: Secondary | ICD-10-CM | POA: Diagnosis not present

## 2020-12-04 DIAGNOSIS — I25708 Atherosclerosis of coronary artery bypass graft(s), unspecified, with other forms of angina pectoris: Secondary | ICD-10-CM | POA: Diagnosis not present

## 2020-12-06 DIAGNOSIS — R82998 Other abnormal findings in urine: Secondary | ICD-10-CM | POA: Diagnosis not present

## 2020-12-06 DIAGNOSIS — N184 Chronic kidney disease, stage 4 (severe): Secondary | ICD-10-CM | POA: Diagnosis not present

## 2020-12-06 DIAGNOSIS — N1832 Chronic kidney disease, stage 3b: Secondary | ICD-10-CM | POA: Diagnosis not present

## 2020-12-06 DIAGNOSIS — R600 Localized edema: Secondary | ICD-10-CM | POA: Diagnosis not present

## 2020-12-06 DIAGNOSIS — I10 Essential (primary) hypertension: Secondary | ICD-10-CM | POA: Diagnosis not present

## 2020-12-10 DIAGNOSIS — I350 Nonrheumatic aortic (valve) stenosis: Secondary | ICD-10-CM | POA: Diagnosis not present

## 2020-12-10 DIAGNOSIS — I25810 Atherosclerosis of coronary artery bypass graft(s) without angina pectoris: Secondary | ICD-10-CM | POA: Diagnosis not present

## 2020-12-10 DIAGNOSIS — E782 Mixed hyperlipidemia: Secondary | ICD-10-CM | POA: Diagnosis not present

## 2020-12-10 DIAGNOSIS — I5032 Chronic diastolic (congestive) heart failure: Secondary | ICD-10-CM | POA: Diagnosis not present

## 2020-12-10 DIAGNOSIS — I10 Essential (primary) hypertension: Secondary | ICD-10-CM | POA: Diagnosis not present

## 2020-12-10 DIAGNOSIS — I493 Ventricular premature depolarization: Secondary | ICD-10-CM | POA: Diagnosis not present

## 2020-12-10 DIAGNOSIS — I4892 Unspecified atrial flutter: Secondary | ICD-10-CM | POA: Diagnosis not present

## 2020-12-10 NOTE — Progress Notes (Signed)
Complete physical exam   Patient: Austin [name]   DOB: 26-Feb-1929   87 y.o. Male  MRN: [id number] Visit Date: 12/11/2020  Today's healthcare provider: Wilhemena Durie, MD   Chief Complaint  Patient presents with   Annual Exam   Subjective    Austin [name] is a 87 y.o. male who presents today for a complete physical exam.  He reports consuming a general diet. Home exercise routine includes walking, yardwork. He generally feels well. He reports sleeping well. He does not have additional problems to discuss today.    Past Medical History:  Diagnosis Date   Absolute anemia 12/08/2014   Allergy    Anginal pain ([place])    much less angina d/t lack of activity   Arthritis    all over body   Bradycardia 05/03/2015   Calculus of kidney 12/08/2014   Coronary artery disease    GERD (gastroesophageal reflux disease)    Glaucoma    History of kidney stones    HLD (hyperlipidemia)    Hyperglycemia 08/27/2015   Hypertension    Macular degeneration    Myocardial infarction [facility]) 2011   Peripheral vascular disease ([place])    VHD (valvular heart disease)    Past Surgical History:  Procedure Laterality Date   ANTERIOR CERVICAL DECOMP/DISCECTOMY FUSION N/A 12/30/2017   Procedure: ANTERIOR CERVICAL DECOMPRESSION/DISCECTOMY FUSION 3 LEVELS- C3-6;  Surgeon: Meade Maw, MD;  Location: [facility] ORS;  Service: Neurosurgery;  Laterality: N/A;   CARDIAC CATHETERIZATION  2007   CARDIAC CATHETERIZATION N/A 04/17/2015   Procedure: Left Heart Cath and Coronary Angiography;  Surgeon: Corey Skains, MD;  Location: [facility];  Service: Cardiovascular;  Laterality: N/A;   CARPAL TUNNEL RELEASE Bilateral    right 06/09/08 left 05/03/09   CATARACT EXTRACTION W/ INTRAOCULAR LENS  IMPLANT, BILATERAL     COLONOSCOPY N/A 10/02/2014   Procedure: COLONOSCOPY;  Surgeon: Manya Silvas, MD;  Location: [facility] ENDOSCOPY;  Service: Endoscopy;  Laterality: N/A;   CORONARY ARTERY BYPASS GRAFT  2007    ESOPHAGOGASTRODUODENOSCOPY     EYE SURGERY     HEMORROIDECTOMY     JOINT REPLACEMENT Right 03/2017   partial knee replacement   KNEE ARTHROSCOPY Right 08/05/2016   Procedure: ARTHROSCOPY KNEE;  Surgeon: Thornton Park, MD;  Location: [facility] ORS;  Service: Orthopedics;  Laterality: Right;   KNEE ARTHROSCOPY WITH LATERAL RELEASE Right 08/05/2016   Procedure: KNEE ARTHROSCOPY WITH LATERAL RELEASE;  Surgeon: Thornton Park, MD;  Location: [facility] ORS;  Service: Orthopedics;  Laterality: Right;   KNEE ARTHROSCOPY WITH MEDIAL MENISECTOMY Right 08/05/2016   Procedure: KNEE ARTHROSCOPY WITH MEDIAL MENISECTOMY;  Surgeon: Thornton Park, MD;  Location: [facility] ORS;  Service: Orthopedics;  Laterality: Right;  Partial   LUMBAR EPIDURAL INJECTION  2011   LUMBAR LAMINECTOMY/DECOMPRESSION MICRODISCECTOMY N/A 03/10/2018   Procedure: LUMBAR LAMINECTOMY/DECOMPRESSION MICRODISCECTOMY 3 LEVELS-L2-5;  Surgeon: Meade Maw, MD;  Location: [facility] ORS;  Service: Neurosurgery;  Laterality: N/A;   MOUTH SURGERY     tooth implant   SHOULDER SURGERY Right 2011   rotator cuff repair   SYNOVECTOMY Right 08/05/2016   Procedure: SYNOVECTOMY;  Surgeon: Thornton Park, MD;  Location: [facility] ORS;  Service: Orthopedics;  Laterality: Right;  arthroscopic extensive synovectomy   TONSILLECTOMY     Social History   Socioeconomic History   Marital status: Married    Spouse name: Blanch Media   Number of children: 1   Years of education: Not on file   Highest education  level: Some college, no degree  Occupational History   Occupation: Engineer, water: RETIRED  Tobacco Use   Smoking status: Former    Years: 15.00    Pack years: 0.00    Types: Cigarettes    Quit date: 1990    Years since quitting: 32.5   Smokeless tobacco: Never  Vaping Use   Vaping Use: Never used  Substance and Sexual Activity   Alcohol use: Yes    Alcohol/week: 2.0 standard drinks    Types: 2 Glasses of wine per week   Drug use: No    Sexual activity: Not Currently  Other Topics Concern   Not on file  Social History Narrative   Not on file   Social Determinants of Health   Financial Resource Strain: Low Risk    Difficulty of Paying Living Expenses: Not hard at all  Food Insecurity: No Food Insecurity   Worried About Charity fundraiser in the Last Year: Never true   [place] in the Last Year: Never true  Transportation Needs: No Transportation Needs   Lack of Transportation (Medical): No   Lack of Transportation (Non-Medical): No  Physical Activity: Sufficiently Active   Days of Exercise per Week: 5 days   Minutes of Exercise per Session: 30 min  Stress: No Stress Concern Present   Feeling of Stress : Not at all  Social Connections: Moderately Integrated   Frequency of Communication with Friends and Family: More than three times a week   Frequency of Social Gatherings with Friends and Family: More than three times a week   Attends Religious Services: Never   Marine scientist or Organizations: Yes   Attends Music therapist: More than 4 times per year   Marital Status: Married  Human resources officer Violence: Not At Risk   Fear of Current or Ex-Partner: No   Emotionally Abused: No   Physically Abused: No   Sexually Abused: No   Family Status  Relation Name Status   Mother  Deceased at age 52       cause of death MI and complications from CABG   Father  Deceased       CHF   Brother  Alive   Mat Uncle  Other       two uncles that died from MI in thier 110's   Brother  Deceased at age 62       cause of death MI   Brother  Deceased   Brother  Deceased at age 56   Brother  Alive   Family History  Problem Relation Age of Onset   Heart disease Mother    Heart disease Father    Heart disease Brother    Heart disease Brother    Heart disease Brother    Heart disease Brother    Heart disease Brother    Allergies  Allergen Reactions   Penicillins Itching, Swelling, Rash and  Other (See Comments)    Has patient had a PCN reaction causing immediate rash, facial/tongue/throat swelling, SOB or lightheadedness with hypotension: Yes Has patient had a PCN reaction causing severe rash involving mucus membranes or skin necrosis: No Has patient had a PCN reaction that required hospitalization No Has patient had a PCN reaction occurring within the last 10 years: No If all of the above answers are "NO", then may proceed with Cephalosporin use.  Other reaction(s): Other (see comments) Has patient had a PCN reaction  causing immediate rash, facial/tongue/throat swelling, SOB or lightheadedness with hypotension: Yes Has patient had a PCN reaction causing severe rash involving mucus membranes or skin necrosis: No Has patient had a PCN reaction that required hospitalization No Has patient had a PCN reaction occurring within the last 10 years: No If all of the above answers are "NO", then may proceed with Cephalosporin use. Has patient had a PCN reaction causing immediate rash, facial/tongue/throat swelling, SOB or lightheadedness with hypotension: Yes Has patient had a PCN reaction causing severe rash involving mucus membranes or skin necrosis: No Has patient had a PCN reaction that required hospitalization No Has patient had a PCN reaction occurring within the last 10 years: No If all of the above answers are "NO", then may proceed with Cephalosporin use. Has patient had a PCN reaction causing immediate rash, facial/tongue/throat swelling, SOB or lightheadedness with hypotension: Yes Has patient had a PCN reaction causing severe rash involving mucus membranes or skin necrosis: No Has patient had a PCN reaction that required hospitalization No Has patient had a PCN reaction occurring within the last 10 years: No If all of the above answers are "NO", then may proceed with Cephalosporin use.    Patient Care Team: Jerrol Banana., MD as PCP - General (Family  Medicine) Lovell Sheehan, MD as Consulting Physician (Orthopedic Surgery) Corey Skains, MD as Consulting Physician (Cardiology) Leandrew Koyanagi, MD as Referring Physician (Ophthalmology) Lucky Cowboy Erskine Squibb, MD as Referring Physician (Vascular Surgery) Murlean Iba, MD (Nephrology)   Medications: Outpatient Medications Prior to Visit  Medication Sig   amLODipine (NORVASC) 5 MG tablet Take 2.5 mg by mouth daily.   amiodarone (PACERONE) 200 MG tablet Take 1 tablet (200 mg total) by mouth daily.   apixaban (ELIQUIS) 2.5 MG TABS tablet Take 1 tablet (2.5 mg total) by mouth 2 (two) times daily.   aspirin EC 81 MG EC tablet Take 1 tablet (81 mg total) by mouth daily. (Patient not taking: No sig reported)   atorvastatin (LIPITOR) 20 MG tablet Take 20 mg by mouth daily.    Biotin 5000 MCG CAPS Take 5,000 mcg by mouth daily.    Cholecalciferol (VITAMIN D3) 2000 UNITS capsule Take 2,000 Units by mouth 2 (two) times daily.    Coenzyme Q10 (COQ-10) 200 MG CAPS Take 200 mg by mouth daily.   dorzolamide-timolol (COSOPT) 22.3-6.8 MG/ML ophthalmic solution Place 1 drop into both eyes 2 (two) times daily.    Glucosamine HCl (GLUCOSAMINE PO) Take 1 tablet by mouth 2 (two) times daily.   isosorbide mononitrate (IMDUR) 60 MG 24 hr tablet Take 60 mg by mouth daily.    Liniments (SALONPAS PAIN RELIEF PATCH EX) Apply 1 patch topically daily as needed (for pain).   Melatonin 5 MG CAPS Take 5 mg by mouth at bedtime as needed (for sleep).    Multiple Vitamins-Minerals (PRESERVISION AREDS 2) CAPS Take 1 capsule by mouth 2 (two) times daily.   nitroGLYCERIN (NITROSTAT) 0.4 MG SL tablet Place 0.4 mg under the tongue every 5 (five) minutes as needed for chest pain.    Omega-3 Fatty Acids (FISH OIL) 1200 MG CAPS Take 1,200 mg by mouth daily.   omeprazole (PRILOSEC) 20 MG capsule Take 20 mg by mouth daily.    Polyethyl Glycol-Propyl Glycol (SYSTANE OP) Place 1 drop into the right eye daily as needed (for  blurriness).    tamsulosin (FLOMAX) 0.4 MG CAPS capsule TAKE 1 CAPSULE BY MOUTH ONCE DAILY   torsemide (DEMADEX) 20 MG  tablet Take 20 mg by mouth daily.   Travoprost, BAK Free, (TRAVATAN) 0.004 % SOLN ophthalmic solution Place 1 drop into both eyes at bedtime.    Turmeric 500 MG TABS Take 1,000 mg by mouth daily.   vitamin B-12 (CYANOCOBALAMIN) 1000 MCG tablet Take 1,000 mcg by mouth daily.   No facility-administered medications prior to visit.    Review of Systems  All other systems reviewed and are negative.     Objective    BP 113/85   Pulse (!) 55   Temp 97.9 F (36.6 C)   Resp 16   Ht 5\' 6"  (1.676 m)   Wt 159 lb (72.1 kg)   BMI 25.66 kg/m     Physical Exam Vitals reviewed.  Constitutional:      General: He is not in acute distress.    Appearance: He is well-developed. He is not diaphoretic.  HENT:     Head: Normocephalic and atraumatic.     Right Ear: External ear normal.     Left Ear: External ear normal.     Nose: Nose normal.     Mouth/Throat:     Pharynx: No oropharyngeal exudate.  Eyes:     General: No scleral icterus.       Right eye: No discharge.        Left eye: No discharge.     Conjunctiva/sclera: Conjunctivae normal.     Pupils: Pupils are equal, round, and reactive to light.  Neck:     Thyroid: No thyromegaly.     Vascular: No JVD.     Trachea: No tracheal deviation.  Cardiovascular:     Rate and Rhythm: Normal rate and regular rhythm.     Heart sounds: Normal heart sounds. No murmur heard.   No friction rub. No gallop.  Pulmonary:     Effort: Pulmonary effort is normal. No respiratory distress.     Breath sounds: Normal breath sounds. No stridor. No wheezing or rales.  Chest:     Chest wall: No tenderness.  Abdominal:     General: Bowel sounds are normal. There is no distension.     Palpations: Abdomen is soft. There is no mass.     Tenderness: There is no abdominal tenderness. There is no guarding or rebound.  Musculoskeletal:         General: No tenderness or deformity. Normal range of motion.     Cervical back: Normal range of motion and neck supple.  Lymphadenopathy:     Cervical: No cervical adenopathy.  Skin:    General: Skin is warm and dry.     Coloration: Skin is not pale.     Findings: No erythema or rash.  Neurological:     General: No focal deficit present.     Mental Status: He is alert and oriented to person, place, and time.     Cranial Nerves: No cranial nerve deficit.     Motor: No abnormal muscle tone.     Coordination: Coordination normal.     Deep Tendon Reflexes: Reflexes are normal and symmetric. Reflexes normal.  Psychiatric:        Mood and Affect: Mood normal.        Behavior: Behavior normal.        Thought Content: Thought content normal.        Judgment: Judgment normal.      Last depression screening scores PHQ 2/9 Scores 12/11/2020 05/08/2020 04/20/2019  PHQ - 2 Score 0 0 0  Exception  Documentation - - -   Last fall risk screening Fall Risk  12/11/2020  Falls in the past year? 0  Number falls in past yr: 0  Injury with Fall? 0  Risk for fall due to : No Fall Risks  Risk for fall due to: Comment -  Follow up Falls evaluation completed   Last Audit-C alcohol use screening Alcohol Use Disorder Test (AUDIT) 12/11/2020  1. How often do you have a drink containing alcohol? 2  2. How many drinks containing alcohol do you have on a typical day when you are drinking? 0  3. How often do you have six or more drinks on one occasion? 0  AUDIT-C Score 2  4. How often during the last year have you found that you were not able to stop drinking once you had started? -  5. How often during the last year have you failed to do what was normally expected from you because of drinking? -  6. How often during the last year have you needed a first drink in the morning to get yourself going after a heavy drinking session? -  7. How often during the last year have you had a feeling of guilt of remorse  after drinking? -  8. How often during the last year have you been unable to remember what happened the night before because you had been drinking? -  9. Have you or someone else been injured as a result of your drinking? -  10. Has a relative or friend or a doctor or another health worker been concerned about your drinking or suggested you cut down? -  Alcohol Use Disorder Identification Test Final Score (AUDIT) -   A score of 3 or more in women, and 4 or more in men indicates increased risk for alcohol abuse, EXCEPT if all of the points are from question 1   No results found for any visits on 12/11/20.  Assessment & Plan    Routine Health Maintenance and Physical Exam  Exercise Activities and Dietary recommendations  Goals      Prevent falls     Recommend to remove any items from the home that may cause slips or trips.         Immunization History  Administered Date(s) Administered   Fluad Quad(high Dose 65+) 02/09/2019, 02/07/2020   Influenza, High Dose Seasonal PF 02/27/2016, 04/18/2017, 02/22/2018   Influenza-Unspecified 03/03/2015   PFIZER(Purple Top)SARS-COV-2 Vaccination 07/28/2019, 08/30/2019   Pneumococcal Conjugate-13 12/14/2013   Pneumococcal Polysaccharide-23 04/04/1998, 03/25/2006   Td 05/01/2008   Tdap 02/25/2011   Zoster Recombinat (Shingrix) 03/13/2020, 08/31/2020   Zoster, Live 10/07/2011    Health Maintenance  Topic Date Due   COVID-19 Vaccine (3 - Pfizer risk series) 09/27/2019   INFLUENZA VACCINE  12/31/2020   TETANUS/TDAP  02/24/2021   PNA vac Low Risk Adult  Completed   Zoster Vaccines- Shingrix  Completed   HPV VACCINES  Aged Out    Discussed health benefits of physical activity, and encouraged him to engage in regular exercise appropriate for his age and condition.  1. Annual physical exam   2. Coronary artery disease involving native coronary artery of native heart with unstable angina pectoris [facility]) Is post CABG in 2007 by Dr.  Darcey Nora  3. Cardiomyopathy, ischemic   4. Chronic atrial fibrillation (HCC) On Eliquis 2.5 twice daily - TSH  5. Stage 3b chronic kidney disease (HCC)  - CBC with Differential/Platelet - Comprehensive metabolic panel  6. Gastroesophageal reflux  disease, unspecified whether esophagitis present  - CBC with Differential/Platelet  7. Hyperlipidemia, unspecified hyperlipidemia type On atorvastatin 20 - Comprehensive metabolic panel - Lipid panel - TSH   No follow-ups on file.     I, Wilhemena Durie, MD, have reviewed all documentation for this visit. The documentation on 12/15/20 for the exam, diagnosis, procedures, and orders are all accurate and complete.    Woodfin Kiss Cranford Mon, MD  [facility] [phone] (phone) [phone] (fax)  [place]

## 2020-12-11 ENCOUNTER — Encounter: Payer: Self-pay | Admitting: Family Medicine

## 2020-12-11 ENCOUNTER — Other Ambulatory Visit: Payer: Self-pay

## 2020-12-11 ENCOUNTER — Ambulatory Visit (INDEPENDENT_AMBULATORY_CARE_PROVIDER_SITE_OTHER): Payer: PPO | Admitting: Family Medicine

## 2020-12-11 VITALS — BP 113/85 | HR 55 | Temp 97.9°F | Resp 16 | Ht 66.0 in | Wt 159.0 lb

## 2020-12-11 DIAGNOSIS — Z0000 Encounter for general adult medical examination without abnormal findings: Secondary | ICD-10-CM

## 2020-12-11 DIAGNOSIS — N1832 Chronic kidney disease, stage 3b: Secondary | ICD-10-CM

## 2020-12-11 DIAGNOSIS — I255 Ischemic cardiomyopathy: Secondary | ICD-10-CM | POA: Diagnosis not present

## 2020-12-11 DIAGNOSIS — E785 Hyperlipidemia, unspecified: Secondary | ICD-10-CM

## 2020-12-11 DIAGNOSIS — I25110 Atherosclerotic heart disease of native coronary artery with unstable angina pectoris: Secondary | ICD-10-CM | POA: Diagnosis not present

## 2020-12-11 DIAGNOSIS — K219 Gastro-esophageal reflux disease without esophagitis: Secondary | ICD-10-CM | POA: Diagnosis not present

## 2020-12-11 DIAGNOSIS — I482 Chronic atrial fibrillation, unspecified: Secondary | ICD-10-CM | POA: Diagnosis not present

## 2020-12-13 DIAGNOSIS — I10 Essential (primary) hypertension: Secondary | ICD-10-CM | POA: Diagnosis not present

## 2020-12-13 DIAGNOSIS — R600 Localized edema: Secondary | ICD-10-CM | POA: Diagnosis not present

## 2020-12-13 DIAGNOSIS — N184 Chronic kidney disease, stage 4 (severe): Secondary | ICD-10-CM | POA: Diagnosis not present

## 2020-12-18 DIAGNOSIS — H353211 Exudative age-related macular degeneration, right eye, with active choroidal neovascularization: Secondary | ICD-10-CM | POA: Diagnosis not present

## 2021-01-03 DIAGNOSIS — H401122 Primary open-angle glaucoma, left eye, moderate stage: Secondary | ICD-10-CM | POA: Diagnosis not present

## 2021-01-04 ENCOUNTER — Ambulatory Visit (INDEPENDENT_AMBULATORY_CARE_PROVIDER_SITE_OTHER): Payer: PPO | Admitting: Vascular Surgery

## 2021-01-04 ENCOUNTER — Other Ambulatory Visit: Payer: Self-pay

## 2021-01-04 ENCOUNTER — Ambulatory Visit (INDEPENDENT_AMBULATORY_CARE_PROVIDER_SITE_OTHER): Payer: PPO

## 2021-01-04 DIAGNOSIS — I6523 Occlusion and stenosis of bilateral carotid arteries: Secondary | ICD-10-CM

## 2021-01-14 DIAGNOSIS — N184 Chronic kidney disease, stage 4 (severe): Secondary | ICD-10-CM | POA: Diagnosis not present

## 2021-01-15 ENCOUNTER — Encounter (INDEPENDENT_AMBULATORY_CARE_PROVIDER_SITE_OTHER): Payer: Self-pay | Admitting: *Deleted

## 2021-01-29 DIAGNOSIS — H353211 Exudative age-related macular degeneration, right eye, with active choroidal neovascularization: Secondary | ICD-10-CM | POA: Diagnosis not present

## 2021-02-01 ENCOUNTER — Other Ambulatory Visit: Payer: Self-pay | Admitting: Family Medicine

## 2021-02-12 DIAGNOSIS — L570 Actinic keratosis: Secondary | ICD-10-CM | POA: Diagnosis not present

## 2021-02-12 DIAGNOSIS — D225 Melanocytic nevi of trunk: Secondary | ICD-10-CM | POA: Diagnosis not present

## 2021-02-12 DIAGNOSIS — L578 Other skin changes due to chronic exposure to nonionizing radiation: Secondary | ICD-10-CM | POA: Diagnosis not present

## 2021-02-12 DIAGNOSIS — Z86018 Personal history of other benign neoplasm: Secondary | ICD-10-CM | POA: Diagnosis not present

## 2021-02-12 DIAGNOSIS — D485 Neoplasm of uncertain behavior of skin: Secondary | ICD-10-CM | POA: Diagnosis not present

## 2021-03-04 DIAGNOSIS — H40001 Preglaucoma, unspecified, right eye: Secondary | ICD-10-CM | POA: Diagnosis not present

## 2021-03-12 DIAGNOSIS — R600 Localized edema: Secondary | ICD-10-CM | POA: Diagnosis not present

## 2021-03-12 DIAGNOSIS — N184 Chronic kidney disease, stage 4 (severe): Secondary | ICD-10-CM | POA: Diagnosis not present

## 2021-03-12 DIAGNOSIS — I10 Essential (primary) hypertension: Secondary | ICD-10-CM | POA: Diagnosis not present

## 2021-03-13 ENCOUNTER — Ambulatory Visit (INDEPENDENT_AMBULATORY_CARE_PROVIDER_SITE_OTHER): Payer: PPO

## 2021-03-13 ENCOUNTER — Other Ambulatory Visit: Payer: Self-pay

## 2021-03-13 DIAGNOSIS — Z23 Encounter for immunization: Secondary | ICD-10-CM | POA: Diagnosis not present

## 2021-03-14 ENCOUNTER — Ambulatory Visit (INDEPENDENT_AMBULATORY_CARE_PROVIDER_SITE_OTHER): Payer: PPO | Admitting: Family Medicine

## 2021-03-14 ENCOUNTER — Encounter: Payer: Self-pay | Admitting: Family Medicine

## 2021-03-14 VITALS — BP 118/57 | HR 59 | Temp 98.3°F | Resp 16 | Wt 154.0 lb

## 2021-03-14 DIAGNOSIS — G8929 Other chronic pain: Secondary | ICD-10-CM

## 2021-03-14 DIAGNOSIS — N1832 Chronic kidney disease, stage 3b: Secondary | ICD-10-CM

## 2021-03-14 DIAGNOSIS — G9519 Other vascular myelopathies: Secondary | ICD-10-CM | POA: Diagnosis not present

## 2021-03-14 DIAGNOSIS — M5416 Radiculopathy, lumbar region: Secondary | ICD-10-CM

## 2021-03-14 DIAGNOSIS — M545 Low back pain, unspecified: Secondary | ICD-10-CM

## 2021-03-14 DIAGNOSIS — I25110 Atherosclerotic heart disease of native coronary artery with unstable angina pectoris: Secondary | ICD-10-CM | POA: Diagnosis not present
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
## 2021-03-18 DIAGNOSIS — Z7901 Long term (current) use of anticoagulants: Secondary | ICD-10-CM | POA: Diagnosis not present

## 2021-03-18 DIAGNOSIS — Z951 Presence of aortocoronary bypass graft: Secondary | ICD-10-CM | POA: Diagnosis not present

## 2021-03-18 DIAGNOSIS — I509 Heart failure, unspecified: Secondary | ICD-10-CM | POA: Diagnosis not present

## 2021-03-18 DIAGNOSIS — Z9181 History of falling: Secondary | ICD-10-CM | POA: Diagnosis not present

## 2021-03-18 DIAGNOSIS — I130 Hypertensive heart and chronic kidney disease with heart failure and stage 1 through stage 4 chronic kidney disease, or unspecified chronic kidney disease: Secondary | ICD-10-CM | POA: Diagnosis not present

## 2021-03-18 DIAGNOSIS — N189 Chronic kidney disease, unspecified: Secondary | ICD-10-CM | POA: Diagnosis not present

## 2021-03-18 DIAGNOSIS — Z7982 Long term (current) use of aspirin: Secondary | ICD-10-CM | POA: Diagnosis not present

## 2021-03-18 DIAGNOSIS — I25118 Atherosclerotic heart disease of native coronary artery with other forms of angina pectoris: Secondary | ICD-10-CM | POA: Diagnosis not present

## 2021-03-18 DIAGNOSIS — Z87891 Personal history of nicotine dependence: Secondary | ICD-10-CM | POA: Diagnosis not present

## 2021-03-18 DIAGNOSIS — Z6824 Body mass index (BMI) 24.0-24.9, adult: Secondary | ICD-10-CM | POA: Diagnosis not present

## 2021-03-19 DIAGNOSIS — I10 Essential (primary) hypertension: Secondary | ICD-10-CM | POA: Diagnosis not present

## 2021-03-19 DIAGNOSIS — N184 Chronic kidney disease, stage 4 (severe): Secondary | ICD-10-CM | POA: Diagnosis not present

## 2021-03-19 DIAGNOSIS — R600 Localized edema: Secondary | ICD-10-CM | POA: Diagnosis not present

## 2021-03-20 ENCOUNTER — Telehealth: Payer: Self-pay

## 2021-03-20 NOTE — Telephone Encounter (Signed)
Copied from [place] [id number]. Topic: General - Other >> Mar 20, 2021  2:48 PM Antonieta Iba C wrote: Reason for CRM: NP Valma Cava, with  [facility] - [phone] is calling in to make provider aware that pt will also be receiving their assistance at times that the PCP isn't available. [facility] make house calls and weekend calls.   No call back is needed.

## 2021-03-25 DIAGNOSIS — H353211 Exudative age-related macular degeneration, right eye, with active choroidal neovascularization: Secondary | ICD-10-CM | POA: Diagnosis not present

## 2021-03-28 DIAGNOSIS — H40001 Preglaucoma, unspecified, right eye: Secondary | ICD-10-CM | POA: Diagnosis not present

## 2021-04-05 DIAGNOSIS — H401122 Primary open-angle glaucoma, left eye, moderate stage: Secondary | ICD-10-CM | POA: Diagnosis not present

## 2021-05-03 ENCOUNTER — Other Ambulatory Visit: Payer: Self-pay | Admitting: Family Medicine

## 2021-05-04 NOTE — Telephone Encounter (Signed)
Requested Prescriptions  Pending Prescriptions Disp Refills  . tamsulosin (FLOMAX) 0.4 MG CAPS capsule [Pharmacy Med Name: TAMSULOSIN HCL 0.4 MG CAP] 90 capsule 0    Sig: TAKE 1 CAPSULE BY MOUTH ONCE DAILY     Urology: Alpha-Adrenergic Blocker Passed - 05/03/2021 11:44 AM      Passed - Last BP in normal range    BP Readings from Last 1 Encounters:  03/14/21 (!) 118/57         Passed - Valid encounter within last 12 months    Recent Outpatient Visits          1 month ago Coronary artery disease involving native coronary artery of native heart with unstable angina pectoris [facility])   [facility] Jerrol Banana., MD   4 months ago Annual physical exam   [facility] Jerrol Banana., MD   10 months ago Benign essential HTN   [facility] Jerrol Banana., MD   1 year ago Benign essential HTN   [facility] Jerrol Banana., MD   1 year ago Coronary artery disease involving native coronary artery of native heart with unstable angina pectoris [facility]      Future Appointments            In 1 week Jerrol Banana., MD [facility], [place]

## 2021-05-07 DIAGNOSIS — I25700 Atherosclerosis of coronary artery bypass graft(s), unspecified, with unstable angina pectoris: Secondary | ICD-10-CM | POA: Diagnosis not present

## 2021-05-07 DIAGNOSIS — I4892 Unspecified atrial flutter: Secondary | ICD-10-CM | POA: Diagnosis not present

## 2021-05-07 DIAGNOSIS — I10 Essential (primary) hypertension: Secondary | ICD-10-CM | POA: Diagnosis not present

## 2021-05-07 DIAGNOSIS — Z01818 Encounter for other preprocedural examination: Secondary | ICD-10-CM | POA: Diagnosis not present

## 2021-05-07 DIAGNOSIS — I350 Nonrheumatic aortic (valve) stenosis: Secondary | ICD-10-CM | POA: Diagnosis not present

## 2021-05-08 DIAGNOSIS — I209 Angina pectoris, unspecified: Secondary | ICD-10-CM | POA: Diagnosis present

## 2021-05-13 ENCOUNTER — Ambulatory Visit: Payer: Self-pay | Admitting: Family Medicine

## 2021-05-16 ENCOUNTER — Other Ambulatory Visit: Payer: Self-pay

## 2021-05-16 ENCOUNTER — Ambulatory Visit
Admission: RE | Admit: 2021-05-16 | Discharge: 2021-05-16 | Disposition: A | Discharge status: Home / Self Care | Payer: PPO | Source: Ambulatory Visit | Attending: Internal Medicine | Admitting: Internal Medicine

## 2021-05-16 ENCOUNTER — Encounter: Payer: Self-pay | Admitting: Internal Medicine

## 2021-05-16 ENCOUNTER — Encounter
Admission: RE | Disposition: A | Discharge status: Home / Self Care | Payer: Self-pay | Source: Ambulatory Visit | Attending: Internal Medicine

## 2021-05-16 DIAGNOSIS — I2582 Chronic total occlusion of coronary artery: Secondary | ICD-10-CM | POA: Insufficient documentation

## 2021-05-16 DIAGNOSIS — I25790 Atherosclerosis of other coronary artery bypass graft(s) with unstable angina pectoris: Secondary | ICD-10-CM | POA: Diagnosis not present

## 2021-05-16 DIAGNOSIS — I209 Angina pectoris, unspecified: Secondary | ICD-10-CM | POA: Diagnosis present

## 2021-05-16 DIAGNOSIS — R079 Chest pain, unspecified: Secondary | ICD-10-CM

## 2021-05-16 DIAGNOSIS — I25709 Atherosclerosis of coronary artery bypass graft(s), unspecified, with unspecified angina pectoris: Secondary | ICD-10-CM | POA: Insufficient documentation

## 2021-05-16 HISTORY — PX: LEFT HEART CATH AND CORONARY ANGIOGRAPHY: CATH118249

## 2021-05-16 SURGERY — LEFT HEART CATH AND CORONARY ANGIOGRAPHY
Anesthesia: Moderate Sedation | Laterality: Left

## 2021-05-16 MED ORDER — HEPARIN (PORCINE) IN NACL 1000-0.9 UT/500ML-% IV SOLN
INTRAVENOUS | Status: AC
  Filled 2021-05-16: qty 1000

## 2021-05-16 MED ORDER — ACETAMINOPHEN 325 MG PO TABS: 650.0000 mg | ORAL_TABLET | ORAL | Status: DC | PRN

## 2021-05-16 MED ORDER — SODIUM CHLORIDE 0.9% FLUSH: 3.0000 mL | Freq: Two times a day (BID) | INTRAVENOUS | Status: DC

## 2021-05-16 MED ORDER — SODIUM CHLORIDE 0.9 % IV SOLN: 250.0000 mL | INTRAVENOUS | Status: DC | PRN

## 2021-05-16 MED ORDER — SODIUM CHLORIDE 0.9% FLUSH: 3.0000 mL | INTRAVENOUS | Status: DC | PRN

## 2021-05-16 MED ORDER — LIDOCAINE HCL 1 % IJ SOLN
INTRAMUSCULAR | Status: AC
  Filled 2021-05-16: qty 20

## 2021-05-16 MED ORDER — HEPARIN (PORCINE) IN NACL 1000-0.9 UT/500ML-% IV SOLN
INTRAVENOUS | Status: DC | PRN
  Administered 2021-05-16 (×2): 500 mL

## 2021-05-16 MED ORDER — FENTANYL CITRATE (PF) 100 MCG/2ML IJ SOLN
INTRAMUSCULAR | Status: DC | PRN
  Administered 2021-05-16: 25 ug via INTRAVENOUS

## 2021-05-16 MED ORDER — SODIUM CHLORIDE 0.9 % WEIGHT BASED INFUSION: 1.0000 mL/kg/h | INTRAVENOUS | Status: DC

## 2021-05-16 MED ORDER — ASPIRIN 81 MG PO CHEW
CHEWABLE_TABLET | ORAL | Status: AC
  Administered 2021-05-16: 81 mg via ORAL
  Filled 2021-05-16: qty 1

## 2021-05-16 MED ORDER — MIDAZOLAM HCL 2 MG/2ML IJ SOLN
INTRAMUSCULAR | Status: AC
  Filled 2021-05-16: qty 2

## 2021-05-16 MED ORDER — LIDOCAINE HCL (PF) 1 % IJ SOLN
INTRAMUSCULAR | Status: DC | PRN
  Administered 2021-05-16: 20 mL

## 2021-05-16 MED ORDER — IOHEXOL 300 MG/ML  SOLN
INTRAMUSCULAR | Status: DC | PRN
  Administered 2021-05-16: 80 mL

## 2021-05-16 MED ORDER — ONDANSETRON HCL 4 MG/2ML IJ SOLN: 4.0000 mg | Freq: Four times a day (QID) | INTRAMUSCULAR | Status: DC | PRN

## 2021-05-16 MED ORDER — SODIUM CHLORIDE 0.9 % WEIGHT BASED INFUSION
3.0000 mL/kg/h | INTRAVENOUS | Status: AC
  Administered 2021-05-16: 3 mL/kg/h via INTRAVENOUS

## 2021-05-16 MED ORDER — FENTANYL CITRATE (PF) 100 MCG/2ML IJ SOLN
INTRAMUSCULAR | Status: AC
  Filled 2021-05-16: qty 2

## 2021-05-16 MED ORDER — MIDAZOLAM HCL 2 MG/2ML IJ SOLN
INTRAMUSCULAR | Status: DC | PRN
  Administered 2021-05-16: 1 mg via INTRAVENOUS

## 2021-05-16 MED ORDER — LABETALOL HCL 5 MG/ML IV SOLN: 10.0000 mg | INTRAVENOUS | Status: DC | PRN

## 2021-05-16 MED ORDER — ASPIRIN 81 MG PO CHEW: 81.0000 mg | CHEWABLE_TABLET | ORAL | Status: AC

## 2021-05-16 MED ORDER — HYDRALAZINE HCL 20 MG/ML IJ SOLN: 10.0000 mg | INTRAMUSCULAR | Status: DC | PRN

## 2021-05-16 SURGICAL SUPPLY — 12 items
CATH INFINITI 5 FR RCB (CATHETERS) ×2 IMPLANT
CATH INFINITI 5FR MULTPACK ANG (CATHETERS) ×2 IMPLANT
DEVICE CLOSURE MYNXGRIP 5F (Vascular Products) ×2 IMPLANT
DRAPE BRACHIAL (DRAPES) IMPLANT
NDL PERC 18GX7CM (NEEDLE) IMPLANT
NEEDLE PERC 18GX7CM (NEEDLE) ×3 IMPLANT
PACK CARDIAC CATH (CUSTOM PROCEDURE TRAY) ×3 IMPLANT
PROTECTION STATION PRESSURIZED (MISCELLANEOUS) ×3
SET ATX SIMPLICITY (MISCELLANEOUS) ×2 IMPLANT
SHEATH AVANTI 5FR X 11CM (SHEATH) ×2 IMPLANT
STATION PROTECTION PRESSURIZED (MISCELLANEOUS) IMPLANT
WIRE GUIDERIGHT .035X150 (WIRE) ×2 IMPLANT

## 2021-05-20 ENCOUNTER — Encounter: Payer: Self-pay | Admitting: Internal Medicine

## 2021-05-20 DIAGNOSIS — H353211 Exudative age-related macular degeneration, right eye, with active choroidal neovascularization: Secondary | ICD-10-CM | POA: Diagnosis not present

## 2021-05-21 ENCOUNTER — Ambulatory Visit (INDEPENDENT_AMBULATORY_CARE_PROVIDER_SITE_OTHER): Payer: PPO

## 2021-05-21 DIAGNOSIS — Z0000 Encounter for general adult medical examination without abnormal findings: Secondary | ICD-10-CM

## 2021-05-21 NOTE — Progress Notes (Signed)
Virtual Visit via Telephone Note  I connected with  Austin [name] on 05/21/21 at  8:20 AM EST by telephone and verified that I am speaking with the correct person using two identifiers.  Location: Patient: home  Provider: [facility] Persons participating in the virtual visit: Austin [name]   I discussed the limitations, risks, security and privacy concerns of performing an evaluation and management service by telephone and the availability of in person appointments. The patient expressed understanding and agreed to proceed.  Interactive audio and video telecommunications were attempted between this nurse and patient, however failed, due to patient having technical difficulties OR patient did not have access to video capability.  We continued and completed visit with audio only.  Some vital signs may be absent or patient reported.   Austin David, LPN  Subjective:   Austin [name] is a 87 y.o. male who presents for Medicare Annual/Subsequent preventive examination.  Review of Systems     Cardiac Risk Factors include: advanced age (>4men, >7 women);male gender     Objective:    Today's Vitals   05/21/21 0836  PainSc: 0-No pain   There is no height or weight on file to calculate BMI.  Advanced Directives 05/21/2021 05/16/2021 05/08/2020 07/09/2019 07/09/2019 04/20/2019 03/10/2018  Does Patient Have a Medical Advance Directive? No Yes Yes Yes Yes Yes Yes  Type of Advance Directive - [place];Living will [place];Living will Living will Living will [place];Living will [place];Living will  Does patient want to make changes to medical advance directive? - - - No - Patient declined - - No - Patient declined  Copy of [place] in Chart? - - No - copy requested - - No - copy requested Yes  Would patient like information on creating a medical advance directive? No - Patient  declined - - No - Patient declined No - Patient declined - -    Current Medications (verified) Outpatient Encounter Medications as of 05/21/2021  Medication Sig   acetaminophen (TYLENOL) 500 MG tablet Take 1,000 mg by mouth every 6 (six) hours as needed for moderate pain or headache.   atorvastatin (LIPITOR) 20 MG tablet Take 20 mg by mouth daily.    Biotin 5000 MCG CAPS Take 5,000 mcg by mouth daily.    Cholecalciferol (VITAMIN D3) 2000 UNITS capsule Take 2,000 Units by mouth 2 (two) times daily.    Coenzyme Q10 (COQ-10) 100 MG CAPS Take 100 mg by mouth daily.   dorzolamide-timolol (COSOPT) 22.3-6.8 MG/ML ophthalmic solution Place 1 drop into both eyes 2 (two) times daily.    isosorbide mononitrate (IMDUR) 60 MG 24 hr tablet Take 60 mg by mouth daily.    Melatonin 5 MG CAPS Take 5 mg by mouth at bedtime as needed (for sleep).    nitroGLYCERIN (NITROSTAT) 0.4 MG SL tablet Place 0.4 mg under the tongue every 5 (five) minutes as needed for chest pain.    omeprazole (PRILOSEC) 20 MG capsule Take 20 mg by mouth daily.    Polyethyl Glycol-Propyl Glycol (SYSTANE OP) Place 1 drop into the right eye daily as needed (burning).   tamsulosin (FLOMAX) 0.4 MG CAPS capsule TAKE 1 CAPSULE BY MOUTH ONCE DAILY   torsemide (DEMADEX) 20 MG tablet Take 10-20 mg by mouth See admin instructions. Alternate taking 10 mg one day and 20 mg the next   Travoprost, BAK Free, (TRAVATAN) 0.004 % SOLN ophthalmic solution Place 1 drop  into both eyes at bedtime.    TURMERIC PO Take 1,000 mg by mouth daily.   apixaban (ELIQUIS) 2.5 MG TABS tablet Take 1 tablet (2.5 mg total) by mouth 2 (two) times daily.   No facility-administered encounter medications on file as of 05/21/2021.    Allergies (verified) Penicillins   History: Past Medical History:  Diagnosis Date   Absolute anemia 12/08/2014   Allergy    Anginal pain (HCC)    much less angina d/t lack of activity   Arthritis    all over body   Bradycardia 05/03/2015    Calculus of kidney 12/08/2014   Coronary artery disease    GERD (gastroesophageal reflux disease)    Glaucoma    History of kidney stones    HLD (hyperlipidemia)    Hyperglycemia 08/27/2015   Hypertension    Macular degeneration    Myocardial infarction [facility]) 2011   Peripheral vascular disease ([place])    VHD (valvular heart disease)    Past Surgical History:  Procedure Laterality Date   ANTERIOR CERVICAL DECOMP/DISCECTOMY FUSION N/A 12/30/2017   Procedure: ANTERIOR CERVICAL DECOMPRESSION/DISCECTOMY FUSION 3 LEVELS- C3-6;  Surgeon: Austin Maw, MD;  Location: [facility] ORS;  Service: Neurosurgery;  Laterality: N/A;   CARDIAC CATHETERIZATION  2007   CARDIAC CATHETERIZATION N/A 04/17/2015   Procedure: Left Heart Cath and Coronary Angiography;  Surgeon: Austin Skains, MD;  Location: [place] [facility];  Service: Cardiovascular;  Laterality: N/A;   CARPAL TUNNEL RELEASE Bilateral    right 06/09/08 left 05/03/09   CATARACT EXTRACTION W/ INTRAOCULAR LENS  IMPLANT, BILATERAL     COLONOSCOPY N/A 10/02/2014   Procedure: COLONOSCOPY;  Surgeon: Austin Silvas, MD;  Location[facility];  Service: Endoscopy;  Laterality: N/A;   CORONARY ARTERY BYPASS GRAFT  2007   ESOPHAGOGASTRODUODENOSCOPY     EYE SURGERY     HEMORROIDECTOMY     JOINT REPLACEMENT Right 03/2017   partial knee replacement   KNEE ARTHROSCOPY Right 08/05/2016   Procedure: ARTHROSCOPY KNEE;  Surgeon: Austin Park, MD;  Location: [facility] ORS;  Service: Orthopedics;  Laterality: Right;   KNEE ARTHROSCOPY WITH LATERAL RELEASE Right 08/05/2016   Procedure: KNEE ARTHROSCOPY WITH LATERAL RELEASE;  Surgeon: Austin Park, MD;  Location: [facility] ORS;  Service: Orthopedics;  Laterality: Right;   KNEE ARTHROSCOPY WITH MEDIAL MENISECTOMY Right 08/05/2016   Procedure: KNEE ARTHROSCOPY WITH MEDIAL MENISECTOMY;  Surgeon: Austin Park, MD;  Location: [facility] OR[facility];  Service: Orthopedics;  Laterality: Right;  Partial   LEFT HEART CATH AND CORONARY  ANGIOGRAPHY Left 05/16/2021   Procedure: LEFT HEART CATH AND CORONARY ANGIOGRAPHY;  Surgeon: Austin Skains, MD;  Location: [place] [facility];  Service: Cardiovascular;  Laterality: Left;   LUMBAR EPIDURAL INJECTION  2011   LUMBAR LAMINECTOMY/DECOMPRESSION MICRODISCECTOMY N/A 03/10/2018   Procedure: LUMBAR LAMINECTOMY/DECOMPRESSION MICRODISCECTOMY 3 LEVELS-L2-5;  Surgeon: Austin Maw, MD;  Location: [facility] OR[facility];  Service: Neurosurgery;  Laterality: N/A;   MOUTH SURGERY     tooth implant   SHOULDER SURGERY Right 2011   rotator cuff repair   SYNOVECTOMY Right 08/05/2016   Procedure: SYNOVECTOMY;  Surgeon: Austin Park, MD;  Location: [facility] OR[facility];  Service: Orthopedics;  Laterality: Right;  arthroscopic extensive synovectomy   TONSILLECTOMY     Family History  Problem Relation Age of Onset   Heart disease Mother    Heart disease Father    Heart disease Brother    Heart disease Brother    Heart disease Brother    Heart disease Brother  Heart disease Brother    Social History   Socioeconomic History   Marital status: Married    Spouse name: Blanch Media   Number of children: 1   Years of education: Not on file   Highest education level: Some college, no degree  Occupational History   Occupation: Engineer, water: RETIRED  Tobacco Use   Smoking status: Former    Years: 15.00    Types: Cigarettes    Quit date: 1990    Years since quitting: 32.9   Smokeless tobacco: Never  Vaping Use   Vaping Use: Never used  Substance and Sexual Activity   Alcohol use: Yes    Alcohol/week: 2.0 standard drinks    Types: 2 Glasses of wine per week   Drug use: No   Sexual activity: Not Currently  Other Topics Concern   Not on file  Social History Narrative   Not on file   Social Determinants of Health   Financial Resource Strain: Low Risk    Difficulty of Paying Living Expenses: Not hard at all  Food Insecurity: No Food Insecurity   Worried About Ship broker in the Last Year: Never true   [place] in the Last Year: Never true  Transportation Needs: No Transportation Needs   Lack of Transportation (Medical): No   Lack of Transportation (Non-Medical): No  Physical Activity: Sufficiently Active   Days of Exercise per Week: 7 days   Minutes of Exercise per Session: 30 min  Stress: No Stress Concern Present   Feeling of Stress : Not at all  Social Connections: Moderately Integrated   Frequency of Communication with Friends and Family: More than three times a week   Frequency of Social Gatherings with Friends and Family: Three times a week   Attends Religious Services: More than 4 times per year   Active Member of Clubs or Organizations: No   Attends Archivist Meetings: Never   Marital Status: Married    Tobacco Counseling Counseling given: Not Answered   Clinical Intake:  Pre-visit preparation completed: Yes  Pain : No/denies pain Pain Score: 0-No pain     Nutritional Risks: None Diabetes: No  How often do you need to have someone help you when you read instructions, pamphlets, or other written materials from your doctor or pharmacy?: 1 - Never  Diabetic?no  Interpreter Needed?: No  Information entered by :: Kirke Shaggy, LPN   Activities of Daily Living In your present state of health, do you have any difficulty performing the following activities: 05/21/2021 05/16/2021  Hearing? Y Y  Comment - wears hearing aids bilaterally  Vision? N N  Difficulty concentrating or making decisions? N N  Walking or climbing stairs? N N  Dressing or bathing? N N  Doing errands, shopping? N -  Preparing Food and eating ? N -  Using the Toilet? N -  In the past six months, have you accidently leaked urine? N -  Do you have problems with loss of bowel control? N -  Managing your Medications? N -  Managing your Finances? N -  Housekeeping or managing your Housekeeping? N -  Some recent data might be hidden     Patient Care Team: Jerrol Banana., MD as PCP - General (Family Medicine) Lovell Sheehan, MD as Consulting Physician (Orthopedic Surgery) Austin Skains, MD as Consulting Physician (Cardiology) Leandrew Koyanagi, MD as Referring Physician (Ophthalmology) Lucky Cowboy Erskine Squibb, MD  as Referring Physician (Vascular Surgery) Murlean Iba, MD (Nephrology)  Indicate any recent Medical Services you may have received from other than [facility] providers in the past year (date may be approximate).     Assessment:   This is a routine wellness examination for [place].  Hearing/Vision screen No results found.  Dietary issues and exercise activities discussed: Current Exercise Habits: Home exercise routine, Type of exercise: walking;Other - see comments (stationary bike), Time (Minutes): 30, Frequency (Times/Week): 7, Weekly Exercise (Minutes/Week): 210, Intensity: Mild   Goals Addressed             This Visit's Progress    DIET - EAT MORE FRUITS AND VEGETABLES         Depression Screen PHQ 2/9 Scores 05/21/2021 12/11/2020 05/08/2020 04/20/2019 01/11/2018 12/22/2017 10/07/2017  PHQ - 2 Score 0 0 0 0 0 0 0  Exception Documentation - - - - - - -    Fall Risk Fall Risk  05/21/2021 12/11/2020 05/08/2020 04/20/2019 01/11/2018  Falls in the past year? 1 0 1 0 No  Number falls in past yr: 0 0 0 0 -  Injury with Fall? 0 0 0 0 -  Risk for fall due to : History of fall(s) No Fall Risks - - -  Risk for fall due to: Comment - - - - -  Follow up Falls prevention discussed Falls evaluation completed Falls prevention discussed - -    FALL RISK PREVENTION PERTAINING TO THE HOME:  Any stairs in or around the home? No  If so, are there any without handrails? No  Home free of loose throw rugs in walkways, pet beds, electrical cords, etc? Yes  Adequate lighting in your home to reduce risk of falls? Yes   ASSISTIVE DEVICES UTILIZED TO PREVENT FALLS:  Life alert? No  Use of a cane, walker or w/c?  Yes  Grab bars in the bathroom? Yes  Shower chair or bench in shower? No  Elevated toilet seat or a handicapped toilet? No    Cognitive Function:Normal cognitive status assessed by direct observation by this Nurse Health Advisor. No abnormalities found.       6CIT Screen 05/08/2020 04/20/2019 03/05/2017  What Year? 0 points 0 points 0 points  What month? 0 points 0 points 0 points  What time? 0 points 0 points 0 points  Count back from 20 0 points 0 points 0 points  Months in reverse 0 points 0 points 0 points  Repeat phrase 0 points 0 points 0 points  Total Score 0 0 0    Immunizations Immunization History  Administered Date(s) Administered   Fluad Quad(high Dose 65+) 02/09/2019, 02/07/2020, 03/13/2021   Influenza, High Dose Seasonal PF 02/27/2016, 04/18/2017, 02/22/2018   Influenza-Unspecified 03/03/2015   PFIZER(Purple Top)SARS-COV-2 Vaccination 07/28/2019, 08/30/2019   Pneumococcal Conjugate-13 12/14/2013   Pneumococcal Polysaccharide-23 04/04/1998, 03/25/2006   Td 05/01/2008   Tdap 02/25/2011   Zoster Recombinat (Shingrix) 03/13/2020, 08/31/2020   Zoster, Live 10/07/2011    TDAP status: Due, Education has been provided regarding the importance of this vaccine. Advised may receive this vaccine at local pharmacy or Health Dept. Aware to provide a copy of the vaccination record if obtained from local pharmacy or Health Dept. Verbalized acceptance and understanding.  Flu Vaccine status: Up to date  Pneumococcal vaccine status: Up to date  Covid-19 vaccine status: Completed vaccines  Qualifies for Shingles Vaccine? Yes   Zostavax completed Yes   Shingrix Completed?: Yes  Screening Tests Health Maintenance  Topic  Date Due   COVID-19 Vaccine (3 - Pfizer risk series) 09/27/2019   TETANUS/TDAP  02/24/2021   Pneumonia Vaccine 87+ Years old  Completed   INFLUENZA VACCINE  Completed   Zoster Vaccines- Shingrix  Completed   HPV VACCINES  Aged Out    Health  Maintenance  Health Maintenance Due  Topic Date Due   COVID-19 Vaccine (3 - Pfizer risk series) 09/27/2019   TETANUS/TDAP  02/24/2021    Colorectal cancer screening: No longer required.   Lung Cancer Screening: (Low Dose CT Chest recommended if Age 32-80 years, 30 pack-year currently smoking OR have quit w/in 15years.) does not qualify.   Additional Screening:  Hepatitis C Screening: does not qualify; Completed no  Vision Screening: Recommended annual ophthalmology exams for early detection of glaucoma and other disorders of the eye. Is the patient up to date with their annual eye exam?  Yes  Who is the provider or what is the name of the office in which the patient attends annual eye exams? [facility] If pt is not established with a provider, would they like to be referred to a provider to establish care? No .   Dental Screening: Recommended annual dental exams for proper oral hygiene  Community Resource Referral / Chronic Care Management: CRR required this visit?  No   CCM required this visit?  No      Plan:     I have personally reviewed and noted the following in the patients chart:   Medical and social history Use of alcohol, tobacco or illicit drugs  Current medications and supplements including opioid prescriptions. Patient is not currently taking opioid prescriptions. Functional ability and status Nutritional status Physical activity Advanced directives List of other physicians Hospitalizations, surgeries, and ER visits in previous 12 months Vitals Screenings to include cognitive, depression, and falls Referrals and appointments  In addition, I have reviewed and discussed with patient certain preventive protocols, quality metrics, and best practice recommendations. A written personalized care plan for preventive services as well as general preventive health recommendations were provided to patient.     Austin David, LPN   20/23/3435   Nurse  Notes: none

## 2021-05-21 NOTE — Patient Instructions (Signed)
[name] , Thank you for taking time to come for your Medicare Wellness Visit. I appreciate your ongoing commitment to your health goals. Please review the following plan we discussed and let me know if I can assist you in the future.   Screening recommendations/referrals: Colonoscopy: aged out Recommended yearly ophthalmology/optometry visit for glaucoma screening and checkup Recommended yearly dental visit for hygiene and checkup  Vaccinations: Influenza vaccine: 03/13/21 Pneumococcal vaccine: 12/14/13 Tdap vaccine: 02/25/11 Shingles vaccine: Zoster 10/07/11   Shingrix 03/13/20, 08/31/20   Covid-19: 07/28/19, 08/30/19  Advanced directives: none  Conditions/risks identified: none  Next appointment: Follow up in one year for your annual wellness visit. 05/22/22 @ 8:20 a.m.  Preventive Care 5 Years and Older, Male Preventive care refers to lifestyle choices and visits with your health care provider that can promote health and wellness. What does preventive care include? A yearly physical exam. This is also called an annual well check. Dental exams once or twice a year. Routine eye exams. Ask your health care provider how often you should have your eyes checked. Personal lifestyle choices, including: Daily care of your teeth and gums. Regular physical activity. Eating a healthy diet. Avoiding tobacco and drug use. Limiting alcohol use. Practicing safe sex. Taking low doses of aspirin every day. Taking vitamin and mineral supplements as recommended by your health care provider. What happens during an annual well check? The services and screenings done by your health care provider during your annual well check will depend on your age, overall health, lifestyle risk factors, and family history of disease. Counseling  Your health care provider may ask you questions about your: Alcohol use. Tobacco use. Drug use. Emotional well-being. Home and relationship well-being. Sexual  activity. Eating habits. History of falls. Memory and ability to understand (cognition). Work and work Statistician. Screening  You may have the following tests or measurements: Height, weight, and BMI. Blood pressure. Lipid and cholesterol levels. These may be checked every 5 years, or more frequently if you are over 21 years old. Skin check. Lung cancer screening. You may have this screening every year starting at age 8 if you have a 30-pack-year history of smoking and currently smoke or have quit within the past 15 years. Fecal occult blood test (FOBT) of the stool. You may have this test every year starting at age 28. Flexible sigmoidoscopy or colonoscopy. You may have a sigmoidoscopy every 5 years or a colonoscopy every 10 years starting at age 85. Prostate cancer screening. Recommendations will vary depending on your family history and other risks. Hepatitis C blood test. Hepatitis B blood test. Sexually transmitted disease (STD) testing. Diabetes screening. This is done by checking your blood sugar (glucose) after you have not eaten for a while (fasting). You may have this done every 1-3 years. Abdominal aortic aneurysm (AAA) screening. You may need this if you are a current or former smoker. Osteoporosis. You may be screened starting at age 78 if you are at high risk. Talk with your health care provider about your test results, treatment options, and if necessary, the need for more tests. Vaccines  Your health care provider may recommend certain vaccines, such as: Influenza vaccine. This is recommended every year. Tetanus, diphtheria, and acellular pertussis (Tdap, Td) vaccine. You may need a Td booster every 10 years. Zoster vaccine. You may need this after age 77. Pneumococcal 13-valent conjugate (PCV13) vaccine. One dose is recommended after age 60. Pneumococcal polysaccharide (PPSV23) vaccine. One dose is recommended after age 62. Talk  to your health care provider about which  screenings and vaccines you need and how often you need them. This information is not intended to replace advice given to you by your health care provider. Make sure you discuss any questions you have with your health care provider. Document Released: 06/15/2015 Document Revised: 02/06/2016 Document Reviewed: 03/20/2015 Elsevier Interactive Patient Education  2017 [place] Prevention in the Home Falls can cause injuries. They can happen to people of all ages. There are many things you can do to make your home safe and to help prevent falls. What can I do on the outside of my home? Regularly fix the edges of walkways and driveways and fix any cracks. Remove anything that might make you trip as you walk through a door, such as a raised step or threshold. Trim any bushes or trees on the path to your home. Use bright outdoor lighting. Clear any walking paths of anything that might make someone trip, such as rocks or tools. Regularly check to see if handrails are loose or broken. Make sure that both sides of any steps have handrails. Any raised decks and porches should have guardrails on the edges. Have any leaves, snow, or ice cleared regularly. Use sand or salt on walking paths during winter. Clean up any spills in your garage right away. This includes oil or grease spills. What can I do in the bathroom? Use night lights. Install grab bars by the toilet and in the tub and shower. Do not use towel bars as grab bars. Use non-skid mats or decals in the tub or shower. If you need to sit down in the shower, use a plastic, non-slip stool. Keep the floor dry. Clean up any water that spills on the floor as soon as it happens. Remove soap buildup in the tub or shower regularly. Attach bath mats securely with double-sided non-slip rug tape. Do not have throw rugs and other things on the floor that can make you trip. What can I do in the bedroom? Use night lights. Make sure that you have a  light by your bed that is easy to reach. Do not use any sheets or blankets that are too big for your bed. They should not hang down onto the floor. Have a firm chair that has side arms. You can use this for support while you get dressed. Do not have throw rugs and other things on the floor that can make you trip. What can I do in the kitchen? Clean up any spills right away. Avoid walking on wet floors. Keep items that you use a lot in easy-to-reach places. If you need to reach something above you, use a strong step stool that has a grab bar. Keep electrical cords out of the way. Do not use floor polish or wax that makes floors slippery. If you must use wax, use non-skid floor wax. Do not have throw rugs and other things on the floor that can make you trip. What can I do with my stairs? Do not leave any items on the stairs. Make sure that there are handrails on both sides of the stairs and use them. Fix handrails that are broken or loose. Make sure that handrails are as long as the stairways. Check any carpeting to make sure that it is firmly attached to the stairs. Fix any carpet that is loose or worn. Avoid having throw rugs at the top or bottom of the stairs. If you do have throw rugs,  attach them to the floor with carpet tape. Make sure that you have a light switch at the top of the stairs and the bottom of the stairs. If you do not have them, ask someone to add them for you. What else can I do to help prevent falls? Wear shoes that: Do not have high heels. Have rubber bottoms. Are comfortable and fit you well. Are closed at the toe. Do not wear sandals. If you use a stepladder: Make sure that it is fully opened. Do not climb a closed stepladder. Make sure that both sides of the stepladder are locked into place. Ask someone to hold it for you, if possible. Clearly mark and make sure that you can see: Any grab bars or handrails. First and last steps. Where the edge of each step  is. Use tools that help you move around (mobility aids) if they are needed. These include: Canes. Walkers. Scooters. Crutches. Turn on the lights when you go into a dark area. Replace any light bulbs as soon as they burn out. Set up your furniture so you have a clear path. Avoid moving your furniture around. If any of your floors are uneven, fix them. If there are any pets around you, be aware of where they are. Review your medicines with your doctor. Some medicines can make you feel dizzy. This can increase your chance of falling. Ask your doctor what other things that you can do to help prevent falls. This information is not intended to replace advice given to you by your health care provider. Make sure you discuss any questions you have with your health care provider. Document Released: 03/15/2009 Document Revised: 10/25/2015 Document Reviewed: 06/23/2014 Elsevier Interactive Patient Education  2017 [employer].

## 2021-06-13 DIAGNOSIS — I6523 Occlusion and stenosis of bilateral carotid arteries: Secondary | ICD-10-CM | POA: Diagnosis not present

## 2021-06-13 DIAGNOSIS — I4892 Unspecified atrial flutter: Secondary | ICD-10-CM | POA: Diagnosis not present

## 2021-06-13 DIAGNOSIS — I350 Nonrheumatic aortic (valve) stenosis: Secondary | ICD-10-CM | POA: Diagnosis not present

## 2021-06-13 DIAGNOSIS — I38 Endocarditis, valve unspecified: Secondary | ICD-10-CM | POA: Diagnosis not present

## 2021-06-13 DIAGNOSIS — I5032 Chronic diastolic (congestive) heart failure: Secondary | ICD-10-CM | POA: Diagnosis not present

## 2021-06-13 DIAGNOSIS — N1832 Chronic kidney disease, stage 3b: Secondary | ICD-10-CM | POA: Diagnosis not present

## 2021-06-13 DIAGNOSIS — E782 Mixed hyperlipidemia: Secondary | ICD-10-CM | POA: Diagnosis not present

## 2021-06-13 DIAGNOSIS — I480 Paroxysmal atrial fibrillation: Secondary | ICD-10-CM | POA: Diagnosis not present

## 2021-06-13 DIAGNOSIS — I10 Essential (primary) hypertension: Secondary | ICD-10-CM | POA: Diagnosis not present

## 2021-06-13 DIAGNOSIS — I25700 Atherosclerosis of coronary artery bypass graft(s), unspecified, with unstable angina pectoris: Secondary | ICD-10-CM | POA: Diagnosis not present

## 2021-06-13 DIAGNOSIS — I739 Peripheral vascular disease, unspecified: Secondary | ICD-10-CM | POA: Diagnosis not present

## 2021-06-13 DIAGNOSIS — I493 Ventricular premature depolarization: Secondary | ICD-10-CM | POA: Diagnosis not present

## 2021-06-25 DIAGNOSIS — M1712 Unilateral primary osteoarthritis, left knee: Secondary | ICD-10-CM | POA: Diagnosis not present

## 2021-07-02 DIAGNOSIS — I25708 Atherosclerosis of coronary artery bypass graft(s), unspecified, with other forms of angina pectoris: Secondary | ICD-10-CM | POA: Diagnosis not present

## 2021-07-02 DIAGNOSIS — N1832 Chronic kidney disease, stage 3b: Secondary | ICD-10-CM | POA: Diagnosis not present

## 2021-07-02 DIAGNOSIS — E782 Mixed hyperlipidemia: Secondary | ICD-10-CM | POA: Diagnosis not present

## 2021-07-02 DIAGNOSIS — I350 Nonrheumatic aortic (valve) stenosis: Secondary | ICD-10-CM | POA: Diagnosis not present

## 2021-07-02 DIAGNOSIS — I4892 Unspecified atrial flutter: Secondary | ICD-10-CM | POA: Diagnosis not present

## 2021-07-02 DIAGNOSIS — I10 Essential (primary) hypertension: Secondary | ICD-10-CM | POA: Diagnosis not present

## 2021-07-08 DIAGNOSIS — H353211 Exudative age-related macular degeneration, right eye, with active choroidal neovascularization: Secondary | ICD-10-CM | POA: Diagnosis not present

## 2021-07-11 ENCOUNTER — Other Ambulatory Visit (INDEPENDENT_AMBULATORY_CARE_PROVIDER_SITE_OTHER): Payer: Self-pay | Admitting: Nurse Practitioner

## 2021-07-11 DIAGNOSIS — I6523 Occlusion and stenosis of bilateral carotid arteries: Secondary | ICD-10-CM

## 2021-07-12 ENCOUNTER — Encounter (INDEPENDENT_AMBULATORY_CARE_PROVIDER_SITE_OTHER): Payer: Self-pay | Admitting: Vascular Surgery

## 2021-07-12 ENCOUNTER — Ambulatory Visit (INDEPENDENT_AMBULATORY_CARE_PROVIDER_SITE_OTHER): Payer: PPO | Admitting: Vascular Surgery

## 2021-07-12 ENCOUNTER — Ambulatory Visit (INDEPENDENT_AMBULATORY_CARE_PROVIDER_SITE_OTHER): Payer: PPO

## 2021-07-12 ENCOUNTER — Other Ambulatory Visit: Payer: Self-pay

## 2021-07-12 VITALS — BP 119/55 | HR 60 | Resp 16 | Wt 151.8 lb

## 2021-07-12 DIAGNOSIS — N1832 Chronic kidney disease, stage 3b: Secondary | ICD-10-CM | POA: Diagnosis not present

## 2021-07-12 DIAGNOSIS — E785 Hyperlipidemia, unspecified: Secondary | ICD-10-CM | POA: Diagnosis not present

## 2021-07-12 DIAGNOSIS — I10 Essential (primary) hypertension: Secondary | ICD-10-CM

## 2021-07-12 DIAGNOSIS — I6523 Occlusion and stenosis of bilateral carotid arteries: Secondary | ICD-10-CM

## 2021-07-12 NOTE — Progress Notes (Signed)
MRN : [id number]  Austin [name] is a 88 y.o. (1928/06/20) male who presents with chief complaint of  Chief Complaint  Patient presents with   Follow-up    Ultrasound follow up  .  History of Present Illness: Patient returns today in follow-up of his carotid disease.  He is doing well today without specific complaints.  No focal neurologic symptoms. Specifically, the patient denies amaurosis fugax, speech or swallowing difficulties, or arm or leg weakness or numbness.  Carotid duplex demonstrates velocities in the upper end of the 40 to 59% range bilaterally.  Current Outpatient Medications  Medication Sig Dispense Refill   acetaminophen (TYLENOL) 500 MG tablet Take 1,000 mg by mouth every 6 (six) hours as needed for moderate pain or headache.     apixaban (ELIQUIS) 2.5 MG TABS tablet Take 1 tablet (2.5 mg total) by mouth 2 (two) times daily. 60 tablet 0   atorvastatin (LIPITOR) 20 MG tablet Take 20 mg by mouth daily.      Biotin 5000 MCG CAPS Take 5,000 mcg by mouth daily.      Cholecalciferol (VITAMIN D3) 2000 UNITS capsule Take 2,000 Units by mouth 2 (two) times daily.      Coenzyme Q10 (COQ-10) 100 MG CAPS Take 100 mg by mouth daily.     dorzolamide-timolol (COSOPT) 22.3-6.8 MG/ML ophthalmic solution Place 1 drop into both eyes 2 (two) times daily.      isosorbide mononitrate (IMDUR) 60 MG 24 hr tablet Take 60 mg by mouth daily.      Melatonin 5 MG CAPS Take 5 mg by mouth at bedtime as needed (for sleep).      nitroGLYCERIN (NITROSTAT) 0.4 MG SL tablet Place 0.4 mg under the tongue every 5 (five) minutes as needed for chest pain.      omeprazole (PRILOSEC) 20 MG capsule Take 20 mg by mouth daily.      Polyethyl Glycol-Propyl Glycol (SYSTANE OP) Place 1 drop into the right eye daily as needed (burning).     tamsulosin (FLOMAX) 0.4 MG CAPS capsule TAKE 1 CAPSULE BY MOUTH ONCE DAILY 90 capsule 0   torsemide (DEMADEX) 20 MG tablet Take 10-20 mg by mouth See admin instructions.  Alternate taking 10 mg one day and 20 mg the next     Travoprost, BAK Free, (TRAVATAN) 0.004 % SOLN ophthalmic solution Place 1 drop into both eyes at bedtime.      TURMERIC PO Take 1,000 mg by mouth daily.     No current facility-administered medications for this visit.    Past Medical History:  Diagnosis Date   Absolute anemia 12/08/2014   Allergy    Anginal pain ([place])    much less angina d/t lack of activity   Arthritis    all over body   Bradycardia 05/03/2015   Calculus of kidney 12/08/2014   Coronary artery disease    GERD (gastroesophageal reflux disease)    Glaucoma    History of kidney stones    HLD (hyperlipidemia)    Hyperglycemia 08/27/2015   Hypertension    Macular degeneration    Myocardial infarction [facility]) 2011   Peripheral vascular disease ([place])    VHD (valvular heart disease)     Past Surgical History:  Procedure Laterality Date   ANTERIOR CERVICAL DECOMP/DISCECTOMY FUSION N/A 12/30/2017   Procedure: ANTERIOR CERVICAL DECOMPRESSION/DISCECTOMY FUSION 3 LEVELS- C3-6;  Surgeon: Meade Maw, MD;  Location: [facility] ORS;  Service: Neurosurgery;  Laterality: N/A;   CARDIAC CATHETERIZATION  2007  CARDIAC CATHETERIZATION N/A 04/17/2015   Procedure: Left Heart Cath and Coronary Angiography;  Surgeon: Corey Skains, MD;  Location: [place] [facility];  Service: Cardiovascular;  Laterality: N/A;   CARPAL TUNNEL RELEASE Bilateral    right 06/09/08 left 05/03/09   CATARACT EXTRACTION W/ INTRAOCULAR LENS  IMPLANT, BILATERAL     COLONOSCOPY N/A 10/02/2014   Procedure: COLONOSCOPY;  Surgeon: Manya Silvas, MD;  Location: [facility];  Service: Endoscopy;  Laterality: N/A;   CORONARY ARTERY BYPASS GRAFT  2007   ESOPHAGOGASTRODUODENOSCOPY     EYE SURGERY     HEMORROIDECTOMY     JOINT REPLACEMENT Right 03/2017   partial knee replacement   KNEE ARTHROSCOPY Right 08/05/2016   Procedure: ARTHROSCOPY KNEE;  Surgeon: Thornton Park, MD;  Location: [facility] ORS;  Service:  Orthopedics;  Laterality: Right;   KNEE ARTHROSCOPY WITH LATERAL RELEASE Right 08/05/2016   Procedure: KNEE ARTHROSCOPY WITH LATERAL RELEASE;  Surgeon: Thornton Park, MD;  Location: [facility] OR[facility];  Service: Orthopedics;  Laterality: Right;   KNEE ARTHROSCOPY WITH MEDIAL MENISECTOMY Right 08/05/2016   Procedure: KNEE ARTHROSCOPY WITH MEDIAL MENISECTOMY;  Surgeon: Thornton Park, MD;  Location: [facility] OR[facility];  Service: Orthopedics;  Laterality: Right;  Partial   LEFT HEART CATH AND CORONARY ANGIOGRAPHY Left 05/16/2021   Procedure: LEFT HEART CATH AND CORONARY ANGIOGRAPHY;  Surgeon: Corey Skains, MD;  Location: [place] [facility];  Service: Cardiovascular;  Laterality: Left;   LUMBAR EPIDURAL INJECTION  2011   LUMBAR LAMINECTOMY/DECOMPRESSION MICRODISCECTOMY N/A 03/10/2018   Procedure: LUMBAR LAMINECTOMY/DECOMPRESSION MICRODISCECTOMY 3 LEVELS-L2-5;  Surgeon: Meade Maw, MD;  Location: [facility] OR[facility];  Service: Neurosurgery;  Laterality: N/A;   MOUTH SURGERY     tooth implant   SHOULDER SURGERY Right 2011   rotator cuff repair   SYNOVECTOMY Right 08/05/2016   Procedure: SYNOVECTOMY;  Surgeon: Thornton Park, MD;  Location: [facility] OR[facility];  Service: Orthopedics;  Laterality: Right;  arthroscopic extensive synovectomy   TONSILLECTOMY       Social History   Tobacco Use   Smoking status: Former    Years: 15.00    Types: Cigarettes    Quit date: 1990    Years since quitting: 33.1   Smokeless tobacco: Never  Vaping Use   Vaping Use: Never used  Substance Use Topics   Alcohol use: Yes    Alcohol/week: 2.0 standard drinks    Types: 2 Glasses of wine per week   Drug use: No      Family History  Problem Relation Age of Onset   Heart disease Mother    Heart disease Father    Heart disease Brother    Heart disease Brother    Heart disease Brother    Heart disease Brother    Heart disease Brother      Allergies  Allergen Reactions   Penicillins Itching, Swelling and Rash    REVIEW OF  SYSTEMS (Negative unless checked)   Constitutional: [] Weight loss  [] Fever  [] Chills Cardiac: [x] Chest pain   [] Chest pressure   [] Palpitations   [] Shortness of breath when laying flat   [] Shortness of breath at rest   [] Shortness of breath with exertion. Vascular:  [] Pain in legs with walking   [] Pain in legs at rest   [] Pain in legs when laying flat   [] Claudication   [] Pain in feet when walking  [] Pain in feet at rest  [] Pain in feet when laying flat   [] History of DVT   [] Phlebitis   [] Swelling in legs   []   Varicose veins   [] Non-healing ulcers Pulmonary:   [] Uses home oxygen   [] Productive cough   [] Hemoptysis   [] Wheeze  [] COPD   [] Asthma Neurologic:  [] Dizziness  [] Blackouts   [] Seizures   [] History of stroke   [] History of TIA  [] Aphasia   [] Temporary blindness   [] Dysphagia   [] Weakness or numbness in arms   [] Weakness or numbness in legs Musculoskeletal:  [x] Arthritis   [] Joint swelling   [] Joint pain   [] Low back pain Hematologic:  [] Easy bruising  [] Easy bleeding   [] Hypercoagulable state   [x] Anemic  [] Hepatitis Gastrointestinal:  [] Blood in stool   [] Vomiting blood  [x] Gastroesophageal reflux/heartburn   [] Difficulty swallowing. Genitourinary:  [] Chronic kidney disease   [] Difficult urination  [] Frequent urination  [] Burning with urination   [] Blood in urine Skin:  [] Rashes   [] Ulcers   [] Wounds Psychological:  [] History of anxiety   []  History of major depression.  Physical Examination  Vitals:   07/12/21 1118  BP: (!) 119/55  Pulse: 60  Resp: 16  Weight: 151 lb 12.8 oz (68.9 kg)   Body mass index is 24.5 kg/m. Gen:  WD/WN, NAD. Appears far younger than stated age. Head: [place]/AT, No temporalis wasting. Ear/Nose/Throat: Hearing grossly intact, nares w/o erythema or drainage, trachea midline Eyes: Conjunctiva clear. Sclera non-icteric Neck: Supple.  Soft left carotid bruit  Pulmonary:  Good air movement, equal and clear to auscultation bilaterally.  Cardiac: RRR, No  JVD Vascular:  Vessel Right Left  Radial  Palpable       Musculoskeletal: M/S 5/5 throughout.  No deformity or atrophy. No LE edema. Neurologic: CN 2-12 intact. Sensation grossly intact in extremities.  Symmetrical.  Speech is fluent. Motor exam as listed above. Psychiatric: Judgment intact, Mood & affect appropriate for pt's clinical situation. Dermatologic: No rashes or ulcers noted.  No cellulitis or open wounds.     CBC Lab Results  Component Value Date   WBC 6.9 07/15/2019   HGB 11.7 (L) 07/15/2019   HCT 34.7 (L) 07/15/2019   MCV 89.9 07/15/2019   PLT 236 07/15/2019    BMET    Component Value Date/Time   NA 133 (L) 07/15/2019 0531   NA 137 11/23/2018 0915   NA 137 05/09/2013 0912   K 4.1 07/15/2019 0531   K 4.4 05/09/2013 0912   CL 103 07/15/2019 0531   CL 103 05/09/2013 0912   CO2 18 (L) 07/15/2019 0531   CO2 29 05/09/2013 0912   GLUCOSE 103 (H) 07/15/2019 0531   GLUCOSE 93 05/09/2013 0912   BUN 62 (H) 07/15/2019 0531   BUN 25 11/23/2018 0915   BUN 26 (H) 05/09/2013 0912   CREATININE 2.60 (H) 07/15/2019 0531   CREATININE 1.50 (H) 03/05/2017 1036   CALCIUM 8.8 (L) 07/15/2019 0531   CALCIUM 9.2 05/09/2013 0912   GFRNONAA 21 (L) 07/15/2019 0531   GFRNONAA 43 (L) 05/09/2013 0912   GFRAA 24 (L) 07/15/2019 0531   GFRAA 50 (L) 05/09/2013 0912   CrCl cannot be calculated (Patient's most recent lab result is older than the maximum 21 days allowed.).  COAG Lab Results  Component Value Date   INR 1.3 (H) 07/09/2019   INR 1.04 03/02/2018   INR 1.10 12/23/2017    Radiology No results found.   Assessment/Plan Benign essential HTN blood pressure control important in reducing the progression of atherosclerotic disease. On appropriate oral medications.     Stage 3b chronic kidney disease Limit contrast.  His most recent GFR was down  into the 20s.  CT angiogram would not be prudent at any point, and if we need further evaluation a catheter-based angiogram  will need to be considered.   HLD (hyperlipidemia) lipid control important in reducing the progression of atherosclerotic disease. Continue statin therapy  Carotid stenosis Carotid duplex demonstrates velocities in the upper end of the 40 to 59% range bilaterally. Continue current medical regimen.  Recheck in 1 year.    Leotis Pain, MD  07/12/2021 11:59 AM    This note was created with Dragon medical transcription system.  Any errors from dictation are purely unintentional

## 2021-07-12 NOTE — Assessment & Plan Note (Signed)
Carotid duplex demonstrates velocities in the upper end of the 40 to 59% range bilaterally. Continue current medical regimen.  Recheck in 1 year.

## 2021-07-15 DIAGNOSIS — I4892 Unspecified atrial flutter: Secondary | ICD-10-CM | POA: Diagnosis not present

## 2021-07-16 DIAGNOSIS — N184 Chronic kidney disease, stage 4 (severe): Secondary | ICD-10-CM | POA: Diagnosis not present

## 2021-07-16 DIAGNOSIS — I10 Essential (primary) hypertension: Secondary | ICD-10-CM | POA: Diagnosis not present

## 2021-07-16 DIAGNOSIS — R600 Localized edema: Secondary | ICD-10-CM | POA: Diagnosis not present

## 2021-07-22 DIAGNOSIS — R600 Localized edema: Secondary | ICD-10-CM | POA: Diagnosis not present

## 2021-07-22 DIAGNOSIS — I10 Essential (primary) hypertension: Secondary | ICD-10-CM | POA: Diagnosis not present

## 2021-07-22 DIAGNOSIS — N184 Chronic kidney disease, stage 4 (severe): Secondary | ICD-10-CM | POA: Diagnosis not present

## 2021-08-02 ENCOUNTER — Other Ambulatory Visit: Payer: Self-pay | Admitting: Family Medicine

## 2021-08-02 NOTE — Telephone Encounter (Signed)
Requested medication (s) are due for refill today:   Yes ? ?Requested medication (s) are on the active medication list:   Yes ? ?Future visit scheduled:   Yes ? ? ?Last ordered: 05/04/2021 #90, 0 refills ? ?Returned because protocol criteria not met.   PSA needed.    ? ?Requested Prescriptions  ?Pending Prescriptions Disp Refills  ? tamsulosin (FLOMAX) 0.4 MG CAPS capsule [Pharmacy Med Name: TAMSULOSIN HCL 0.4 MG CAP] 90 capsule 0  ?  Sig: TAKE 1 CAPSULE BY MOUTH ONCE DAILY  ?  ? Urology: Alpha-Adrenergic Blocker Failed - 08/02/2021  1:04 PM  ?  ?  Failed - PSA in normal range and within 360 days  ?  No results found for: LABPSA, PSA, PSA1, ULTRAPSA  ?  ?  ?  Passed - Last BP in normal range  ?  BP Readings from Last 1 Encounters:  ?07/12/21 (!) 119/55  ?  ?  ?  ?  Passed - Valid encounter within last 12 months  ?  Recent Outpatient Visits   ? ?      ? 4 months ago Coronary artery disease involving native coronary artery of native heart with unstable angina pectoris (HCC)  ? [facility] Gilbert, Richard L Jr., MD  ? 7 months ago Annual physical exam  ? [facility] Gilbert, Richard L Jr., MD  ? 1 year ago Benign essential HTN  ? [facility] Gilbert, Richard L Jr., MD  ? 1 year ago Benign essential HTN  ? [facility] Gilbert, Richard L Jr., MD  ? 1 year ago Coronary artery disease involving native coronary artery of native heart with unstable angina pectoris (HCC)  ? [facility] Gilbert, Richard L Jr., MD  ? ?  ?  ?Future Appointments   ? ?        ? In 1 month Gilbert, Richard L Jr., MD [facility]  ? ?  ? ?  ?  ?  ? ?

## 2021-08-12 DIAGNOSIS — I4892 Unspecified atrial flutter: Secondary | ICD-10-CM | POA: Diagnosis not present

## 2021-08-12 DIAGNOSIS — L821 Other seborrheic keratosis: Secondary | ICD-10-CM | POA: Diagnosis not present

## 2021-08-12 DIAGNOSIS — I480 Paroxysmal atrial fibrillation: Secondary | ICD-10-CM | POA: Diagnosis not present

## 2021-08-12 DIAGNOSIS — Z872 Personal history of diseases of the skin and subcutaneous tissue: Secondary | ICD-10-CM | POA: Diagnosis not present

## 2021-08-12 DIAGNOSIS — I25810 Atherosclerosis of coronary artery bypass graft(s) without angina pectoris: Secondary | ICD-10-CM | POA: Diagnosis not present

## 2021-08-12 DIAGNOSIS — I350 Nonrheumatic aortic (valve) stenosis: Secondary | ICD-10-CM | POA: Diagnosis not present

## 2021-08-12 DIAGNOSIS — L578 Other skin changes due to chronic exposure to nonionizing radiation: Secondary | ICD-10-CM | POA: Diagnosis not present

## 2021-08-12 DIAGNOSIS — I10 Essential (primary) hypertension: Secondary | ICD-10-CM | POA: Diagnosis not present

## 2021-08-12 DIAGNOSIS — E782 Mixed hyperlipidemia: Secondary | ICD-10-CM | POA: Diagnosis not present

## 2021-08-12 DIAGNOSIS — I495 Sick sinus syndrome: Secondary | ICD-10-CM | POA: Diagnosis not present

## 2021-08-12 DIAGNOSIS — Z86018 Personal history of other benign neoplasm: Secondary | ICD-10-CM | POA: Diagnosis not present

## 2021-08-26 DIAGNOSIS — H353211 Exudative age-related macular degeneration, right eye, with active choroidal neovascularization: Secondary | ICD-10-CM | POA: Diagnosis not present

## 2021-08-27 ENCOUNTER — Other Ambulatory Visit: Payer: Self-pay

## 2021-08-27 ENCOUNTER — Observation Stay
Admission: RE | Admit: 2021-08-27 | Discharge: 2021-08-28 | Disposition: A | Discharge status: Home / Self Care | Payer: PPO | Attending: Cardiology | Admitting: Cardiology

## 2021-08-27 ENCOUNTER — Encounter
Admission: RE | Disposition: A | Discharge status: Home / Self Care | Payer: Self-pay | Source: Home / Self Care | Attending: Cardiology

## 2021-08-27 ENCOUNTER — Encounter: Payer: Self-pay | Admitting: Cardiology

## 2021-08-27 DIAGNOSIS — I2510 Atherosclerotic heart disease of native coronary artery without angina pectoris: Secondary | ICD-10-CM | POA: Insufficient documentation

## 2021-08-27 DIAGNOSIS — Z8616 Personal history of COVID-19: Secondary | ICD-10-CM | POA: Insufficient documentation

## 2021-08-27 DIAGNOSIS — I5030 Unspecified diastolic (congestive) heart failure: Secondary | ICD-10-CM | POA: Insufficient documentation

## 2021-08-27 DIAGNOSIS — Z951 Presence of aortocoronary bypass graft: Secondary | ICD-10-CM | POA: Insufficient documentation

## 2021-08-27 DIAGNOSIS — R001 Bradycardia, unspecified: Secondary | ICD-10-CM | POA: Diagnosis not present

## 2021-08-27 DIAGNOSIS — Z006 Encounter for examination for normal comparison and control in clinical research program: Secondary | ICD-10-CM | POA: Diagnosis not present

## 2021-08-27 DIAGNOSIS — Z87891 Personal history of nicotine dependence: Secondary | ICD-10-CM | POA: Diagnosis not present

## 2021-08-27 DIAGNOSIS — I110 Hypertensive heart disease with heart failure: Secondary | ICD-10-CM | POA: Insufficient documentation

## 2021-08-27 DIAGNOSIS — I495 Sick sinus syndrome: Principal | ICD-10-CM | POA: Diagnosis present

## 2021-08-27 HISTORY — PX: PACEMAKER LEADLESS INSERTION: EP1219

## 2021-08-27 HISTORY — DX: Unspecified atrial fibrillation: I48.91

## 2021-08-27 SURGERY — PACEMAKER LEADLESS INSERTION
Anesthesia: Moderate Sedation

## 2021-08-27 MED ORDER — TAMSULOSIN HCL 0.4 MG PO CAPS
0.4000 mg | ORAL_CAPSULE | Freq: Every evening | ORAL | Status: DC
  Administered 2021-08-27: 0.4 mg via ORAL
  Filled 2021-08-27: qty 1

## 2021-08-27 MED ORDER — IOHEXOL 300 MG/ML  SOLN
INTRAMUSCULAR | Status: DC | PRN
Start: 2021-08-27 — End: 2021-08-27
  Administered 2021-08-27: 9 mL

## 2021-08-27 MED ORDER — METOPROLOL TARTRATE 50 MG PO TABS
50.0000 mg | ORAL_TABLET | Freq: Two times a day (BID) | ORAL | Status: DC
  Administered 2021-08-27 – 2021-08-28 (×2): 50 mg via ORAL
  Filled 2021-08-27 (×2): qty 1

## 2021-08-27 MED ORDER — HEPARIN SODIUM (PORCINE) 1000 UNIT/ML IJ SOLN
INTRAMUSCULAR | Status: DC | PRN
  Administered 2021-08-27: 4000 [IU] via INTRAVENOUS

## 2021-08-27 MED ORDER — ACETAMINOPHEN 325 MG PO TABS
650.0000 mg | ORAL_TABLET | ORAL | Status: DC | PRN
  Administered 2021-08-27: 650 mg via ORAL
  Filled 2021-08-27: qty 2

## 2021-08-27 MED ORDER — ATORVASTATIN CALCIUM 20 MG PO TABS
40.0000 mg | ORAL_TABLET | Freq: Every evening | ORAL | Status: DC
  Administered 2021-08-27: 40 mg via ORAL
  Filled 2021-08-27: qty 2

## 2021-08-27 MED ORDER — CHLORHEXIDINE GLUCONATE CLOTH 2 % EX PADS
6.0000 | MEDICATED_PAD | Freq: Every day | CUTANEOUS | Status: DC
  Administered 2021-08-27 – 2021-08-28 (×2): 6 via TOPICAL

## 2021-08-27 MED ORDER — TORSEMIDE 20 MG PO TABS
20.0000 mg | ORAL_TABLET | Freq: Every day | ORAL | Status: DC
  Administered 2021-08-27 – 2021-08-28 (×2): 20 mg via ORAL
  Filled 2021-08-27 (×2): qty 1

## 2021-08-27 MED ORDER — AMLODIPINE BESYLATE 5 MG PO TABS
5.0000 mg | ORAL_TABLET | Freq: Every day | ORAL | Status: DC
  Administered 2021-08-28: 5 mg via ORAL
  Filled 2021-08-27: qty 1

## 2021-08-27 MED ORDER — LIDOCAINE HCL 1 % IJ SOLN
INTRAMUSCULAR | Status: AC
  Filled 2021-08-27: qty 20

## 2021-08-27 MED ORDER — ISOSORBIDE MONONITRATE ER 60 MG PO TB24
60.0000 mg | ORAL_TABLET | Freq: Every day | ORAL | Status: DC
  Administered 2021-08-27 – 2021-08-28 (×2): 60 mg via ORAL
  Filled 2021-08-27 (×2): qty 1

## 2021-08-27 MED ORDER — SODIUM CHLORIDE 0.9 % IV SOLN: 250.0000 mL | INTRAVENOUS | Status: DC | PRN

## 2021-08-27 MED ORDER — MELATONIN 5 MG PO TABS
2.5000 mg | ORAL_TABLET | Freq: Every day | ORAL | Status: DC
  Administered 2021-08-27: 2.5 mg via ORAL
  Filled 2021-08-27: qty 1

## 2021-08-27 MED ORDER — HEPARIN SODIUM (PORCINE) 1000 UNIT/ML IJ SOLN
INTRAMUSCULAR | Status: AC
  Filled 2021-08-27: qty 10

## 2021-08-27 MED ORDER — DORZOLAMIDE HCL-TIMOLOL MAL 2-0.5 % OP SOLN
1.0000 [drp] | Freq: Two times a day (BID) | OPHTHALMIC | Status: DC
  Administered 2021-08-27 – 2021-08-28 (×2): 1 [drp] via OPHTHALMIC
  Filled 2021-08-27: qty 10

## 2021-08-27 MED ORDER — ONDANSETRON HCL 4 MG/2ML IJ SOLN: 4.0000 mg | Freq: Four times a day (QID) | INTRAMUSCULAR | Status: DC | PRN

## 2021-08-27 MED ORDER — FENTANYL CITRATE (PF) 100 MCG/2ML IJ SOLN
INTRAMUSCULAR | Status: DC | PRN
  Administered 2021-08-27 (×2): 25 ug via INTRAVENOUS

## 2021-08-27 MED ORDER — MIDAZOLAM HCL 2 MG/2ML IJ SOLN
INTRAMUSCULAR | Status: DC | PRN
  Administered 2021-08-27: 1 mg via INTRAVENOUS

## 2021-08-27 MED ORDER — SODIUM CHLORIDE 0.9 % IV SOLN
INTRAVENOUS | Status: DC
  Administered 2021-08-27: 1000 mL via INTRAVENOUS

## 2021-08-27 MED ORDER — LIDOCAINE HCL (PF) 1 % IJ SOLN
INTRAMUSCULAR | Status: DC | PRN
  Administered 2021-08-27: 30 mL

## 2021-08-27 MED ORDER — VANCOMYCIN HCL IN DEXTROSE 1-5 GM/200ML-% IV SOLN
1000.0000 mg | INTRAVENOUS | Status: DC
  Filled 2021-08-27: qty 200

## 2021-08-27 MED ORDER — SODIUM CHLORIDE 0.9% FLUSH
3.0000 mL | Freq: Two times a day (BID) | INTRAVENOUS | Status: DC
  Administered 2021-08-27 – 2021-08-28 (×2): 3 mL via INTRAVENOUS

## 2021-08-27 MED ORDER — NITROGLYCERIN 0.4 MG SL SUBL
0.4000 mg | SUBLINGUAL_TABLET | SUBLINGUAL | Status: DC | PRN
  Administered 2021-08-27: 0.4 mg via SUBLINGUAL
  Filled 2021-08-27: qty 1

## 2021-08-27 MED ORDER — FENTANYL CITRATE (PF) 100 MCG/2ML IJ SOLN
INTRAMUSCULAR | Status: AC
Start: 2021-08-27 — End: ?
  Filled 2021-08-27: qty 2

## 2021-08-27 MED ORDER — HEPARIN (PORCINE) IN NACL 2000-0.9 UNIT/L-% IV SOLN
INTRAVENOUS | Status: DC | PRN
  Administered 2021-08-27: 1000 mL

## 2021-08-27 MED ORDER — LATANOPROST 0.005 % OP SOLN
1.0000 [drp] | Freq: Every day | OPHTHALMIC | Status: DC
  Administered 2021-08-27: 1 [drp] via OPHTHALMIC
  Filled 2021-08-27: qty 2.5

## 2021-08-27 MED ORDER — PANTOPRAZOLE SODIUM 40 MG PO TBEC
40.0000 mg | DELAYED_RELEASE_TABLET | Freq: Every day | ORAL | Status: DC
  Administered 2021-08-27 – 2021-08-28 (×2): 40 mg via ORAL
  Filled 2021-08-27 (×2): qty 1

## 2021-08-27 MED ORDER — SODIUM CHLORIDE 0.9% FLUSH: 3.0000 mL | INTRAVENOUS | Status: DC | PRN

## 2021-08-27 MED ORDER — SODIUM CHLORIDE 0.45 % IV SOLN: INTRAVENOUS | Status: DC

## 2021-08-27 MED ORDER — MIDAZOLAM HCL 2 MG/2ML IJ SOLN
INTRAMUSCULAR | Status: AC
  Filled 2021-08-27: qty 2

## 2021-08-27 SURGICAL SUPPLY — 17 items
DILATOR VESSEL 38 20CM 12FR (INTRODUCER) ×1 IMPLANT
DILATOR VESSEL 38 20CM 14FR (INTRODUCER) ×1 IMPLANT
DILATOR VESSEL 38 20CM 18FR (INTRODUCER) ×1 IMPLANT
DILATOR VESSEL 38 20CM 8FR (INTRODUCER) ×1 IMPLANT
MICRA AV TRANSCATH PACING SYS (Pacemaker) ×2 IMPLANT
MICRA INTRODUCER SHEATH (SHEATH) ×2
NDL PERC 18GX7CM (NEEDLE) IMPLANT
NEEDLE PERC 18GX7CM (NEEDLE) ×2 IMPLANT
PACK CARDIAC CATH (CUSTOM PROCEDURE TRAY) ×2 IMPLANT
PAD ELECT DEFIB RADIOL ZOLL (MISCELLANEOUS) ×1 IMPLANT
PROTECTION STATION PRESSURIZED (MISCELLANEOUS) ×2
SHEATH AVANTI 7FRX11 (SHEATH) ×1 IMPLANT
SHEATH INTRODUCER MICRA (SHEATH) IMPLANT
STATION PROTECTION PRESSURIZED (MISCELLANEOUS) IMPLANT
SUT SILK 0 FSL (SUTURE) ×1 IMPLANT
SYSTEM PACING TRNSCTH AV MICRA (Pacemaker) IMPLANT
WIRE AMPLATZ SS-J .035X180CM (WIRE) ×1 IMPLANT

## 2021-08-28 ENCOUNTER — Encounter: Payer: Self-pay | Admitting: Cardiology

## 2021-08-28 DIAGNOSIS — I495 Sick sinus syndrome: Secondary | ICD-10-CM | POA: Diagnosis not present

## 2021-08-28 LAB — BASIC METABOLIC PANEL
Anion gap: 9 (ref 5–15)
BUN: 40 mg/dL — ABNORMAL HIGH (ref 8–23)
CO2: 20 mmol/L — ABNORMAL LOW (ref 22–32)
Calcium: 8.5 mg/dL — ABNORMAL LOW (ref 8.9–10.3)
Chloride: 104 mmol/L (ref 98–111)
Creatinine, Ser: 1.99 mg/dL — ABNORMAL HIGH (ref 0.61–1.24)
GFR, Estimated: 31 mL/min — ABNORMAL LOW (ref >60)
Glucose, Bld: 135 mg/dL — ABNORMAL HIGH (ref 70–99)
Potassium: 4.2 mmol/L (ref 3.5–5.1)
Sodium: 133 mmol/L — ABNORMAL LOW (ref 135–145)

## 2021-08-28 MED ORDER — METOPROLOL TARTRATE 50 MG PO TABS: 25.0000 mg | ORAL_TABLET | Freq: Two times a day (BID) | ORAL | 1 refills | Status: DC

## 2021-08-28 NOTE — Care Management Obs Status (Signed)
MEDICARE OBSERVATION STATUS NOTIFICATION ? ? ?Patient Details  ?Name: Austin [name] ?MRN: [id number] ?Date of Birth: Apr 22, 1929 ? ? ?Medicare Observation Status Notification Given:  Yes ? ? ? ?Candie Chroman, LCSW ?08/28/2021, 9:41 AM ?

## 2021-08-28 NOTE — Discharge Summary (Signed)
? ?Physician Discharge Summary  ? ? ? ? ?Patient ID: ?Austin [name] ?MRN: [id number] ?DOB/AGE: Dec 15, 1928 88 y.o. ? ?Admit date: 08/27/2021 ?Discharge date: 08/28/2021 ? ?Primary Discharge Diagnosis sick sinus syndrome ?Secondary Discharge Diagnosis same, s/p micra leadless pacemaker  ? ?Significant Diagnostic Studies: none ? ?Consults: none ? ?Hospital Course: The patient was brought to the cath lab and underwent  micra leadless pacemaker placement with Dr. Isaias Cowman on 08/28/2021. The patient tolerated with procedure well without complications. The device was interrogated post procedure and is functioning appropriately with good threshold and lead impedance. On 08/28/2021 the incision site was examined and found to be without significant erythema, tenderness to palpation, or apparent aneurysm. Figure of eight suture was removed at the beside by me and was covered with dry gauze and tegaderm dressing. The patient was given care instructions and warning symptoms to look out at the incision site and will follow up in office in 1 week, or sooner if needed.   ? ? ?Discharge Exam: ?Blood pressure 107/64, pulse 62, temperature 98 ?F (36.7 ?C), temperature source Oral, resp. rate 18, height '5\' 6"'$  (1.676 m), weight 68 kg, SpO2 93 %.   ? ?PHYSICAL EXAM ?General: Pleasant elderly caucasian male, well nourished, in no acute distress. Sitting upright in recliner. ?HEENT:  Normocephalic and atraumatic. ?Neck:   No JVD.  ?Lungs: Normal respiratory effort on room air.  ?Heart: HRRR .  ?Abdomen: non-distended appearing.  ?Msk: Normal strength and tone for age. ?Extremities: No clubbing, cyanosis, edema.  ?Access: Right groin with figure of 8 suture, without tenderness, swelling, erythema or apparent aneurysm. Suture removed and site covered with 2x2 and tegaderm. ?Neuro: Alert and oriented x3 ?Psych:  mood appropriate for situation.   ? ?Labs: ?  ?Lab Results  ?Component Value Date  ? WBC 6.9 07/15/2019  ? HGB 11.7 (L)  07/15/2019  ? HCT 34.7 (L) 07/15/2019  ? MCV 89.9 07/15/2019  ? PLT 236 07/15/2019  ?  ?Recent Labs  ?Lab 08/28/21 ?9509  ?NA 133*  ?K 4.2  ?CL 104  ?CO2 20*  ?BUN 40*  ?CREATININE 1.99*  ?CALCIUM 8.5*  ?GLUCOSE 135*  ? ? ?EKG: Afib with V paced beats, rate 70. ? ?FOLLOW UP PLANS AND APPOINTMENTS ? ?Allergies as of 08/28/2021   ? ?   Reactions  ? Penicillins Itching, Swelling, Rash  ? ?  ? ?  ?Medication List  ?  ? ?STOP taking these medications   ? ?acetaminophen 500 MG tablet ?Commonly known as: TYLENOL ?  ? ?  ? ?TAKE these medications   ? ?amLODipine 5 MG tablet ?Commonly known as: NORVASC ?Take 5 mg by mouth daily. ?  ?apixaban 2.5 MG Tabs tablet ?Commonly known as: ELIQUIS ?Take 1 tablet (2.5 mg total) by mouth 2 (two) times daily. ?  ?atorvastatin 20 MG tablet ?Commonly known as: LIPITOR ?Take 20 mg by mouth daily. ?  ?Biotin 5000 MCG Caps ?Take 5,000 mcg by mouth daily. ?  ?COQ-10 PO ?Take 200 mg by mouth daily. ?  ?dorzolamide-timolol 22.3-6.8 MG/ML ophthalmic solution ?Commonly known as: COSOPT ?Place 1 drop into both eyes 2 (two) times daily. ?  ?isosorbide mononitrate 60 MG 24 hr tablet ?Commonly known as: IMDUR ?Take 60 mg by mouth daily. ?  ?latanoprost 0.005 % ophthalmic solution ?Commonly known as: XALATAN ?Place 1 drop into both eyes at bedtime. ?  ?Melatonin 5 MG Caps ?Take 5 mg by mouth at bedtime as needed (for sleep). ?  ?metoprolol tartrate 50  MG tablet ?Commonly known as: LOPRESSOR ?Take 0.5 tablets (25 mg total) by mouth 2 (two) times daily. ?  ?nitroGLYCERIN 0.4 MG SL tablet ?Commonly known as: NITROSTAT ?Place 0.4 mg under the tongue every 5 (five) minutes as needed for chest pain. ?  ?omeprazole 20 MG capsule ?Commonly known as: PRILOSEC ?Take 20 mg by mouth daily. ?  ?PRESERVISION AREDS PO ?Take 1 tablet by mouth 2 (two) times daily. ?  ?SYSTANE OP ?Place 1 drop into the right eye daily as needed (burning). ?  ?tamsulosin 0.4 MG Caps capsule ?Commonly known as: FLOMAX ?TAKE 1 CAPSULE BY  MOUTH ONCE DAILY ?  ?torsemide 20 MG tablet ?Commonly known as: DEMADEX ?Take 10 mg by mouth daily. ?  ?TURMERIC PO ?Take 1,000 mg by mouth daily. ?  ?Vitamin B-12 5000 MCG Subl ?Place 5,000 mcg under the tongue daily. ?  ?Vitamin D3 50 MCG (2000 UT) capsule ?Take 2,000 Units by mouth 2 (two) times daily. ?  ? ?  ? ? Follow-up Information   ? ? Corey Skains, MD. Go in 1 week(s).   ?Specialty: Cardiology ?Contact information: ?[address] ?[facility] ?[place] [address] ?[phone] ? ? ?  ?  ? ?  ?  ? ?  ? ? ?BRING ALL MEDICATIONS WITH YOU TO FOLLOW UP APPOINTMENTS ? ?Time spent with patient: >62mns ?Signed: ? ?LAlanson PulsTang PA-C ?08/28/2021, 8:00 AM ? ?  ?

## 2021-08-28 NOTE — Discharge Instructions (Signed)
Please avoid showering/submerging yourself in water for the next week. If your bandage gets wet or starts to fall off, replace it with another piece of gauze and the Tegaderm (clear bandage I provided). You should try to keep it covered for a week. You will follow up with Dr. Kowalski in the office in about 1 week. If you have bleeding from the site, lie down and apply firm pressure for 20 minutes, if it continues to bleed, call our office ([phone]). Avoid heavy lifting, squatting (other than sitting) and strenuous activity for 1 week. You will get a call from [employer] to set up your pacemaker and the [employer] device. You do not need to bring this device with you to appointments. It is used to monitor your pacemaker from home.  

## 2021-08-28 NOTE — TOC CM/SW Note (Signed)
Patient has orders to discharge home today. Chart reviewed. PCP is Miguel Aschoff, MD. On room air. No wounds. No TOC needs identified. CSW signing off. ? ?Dayton Scrape, [place] ?[phone] ? ?

## 2021-09-03 NOTE — Progress Notes (Signed)
?  ? ? ?Established patient visit ? ?I,April Miller,acting as a scribe for Wilhemena Durie, MD.,have documented all relevant documentation on the behalf of Wilhemena Durie, MD,as directed by  Wilhemena Durie, MD while in the presence of Wilhemena Durie, MD. ? ? ?Patient: Austin [name]   DOB: [birth year]   88 y.o. Male  MRN: [id number] ?Visit Date: 09/09/2021 ? ?Today's healthcare provider: Wilhemena Durie, MD  ? ?Chief Complaint  ?Patient presents with  ? Follow-up  ? Hypertension  ? Coronary Artery Disease  ? ?Subjective  ?  ?HPI  ?Patient had a pacemaker placed couple of weeks ago and is doing well with this.  His main complaint is dyspnea on exertion for the past few weeks.  No chest pain and no PND or orthopnea.  He sees Dr. Nehemiah Massed in 2 days in the office.  He has been on an extra Lasix dose for the past 3 days.  He is not in any distress in the office .  Overall he is feeling okay other than the dyspnea on exertion. ? ?Hypertension, follow-up ? ?BP Readings from Last 3 Encounters:  ?09/09/21 (!) 144/70  ?08/28/21 107/64  ?07/12/21 (!) 119/55  ? Wt Readings from Last 3 Encounters:  ?09/09/21 158 lb (71.7 kg)  ?08/27/21 150 lb (68 kg)  ?07/12/21 151 lb 12.8 oz (68.9 kg)  ?  ? ?Management since that visit includes on metoprolol. ? ?He reports good compliance with treatment. ?He is not having side effects. none ?He is following a Regular, Low Sodium diet. ?He is not exercising. ?He does not smoke. ? ?Use of agents associated with hypertension: none.  ? ?Outside blood pressures are 108/54-153-68. ? ?Pertinent labs ?Lab Results  ?Component Value Date  ? CHOL 140 06/20/2020  ? HDL 42 06/20/2020  ? [place] 84 06/20/2020  ? TRIG 72 06/20/2020  ? CHOLHDL 3.3 06/20/2020  ? Lab Results  ?Component Value Date  ? NA 133 (L) 08/28/2021  ? K 4.2 08/28/2021  ? CREATININE 1.99 (H) 08/28/2021  ? GFRNONAA 31 (L) 08/28/2021  ? GLUCOSE 135 (H) 08/28/2021  ? TSH 9.330 (H) 06/20/2020  ?  ? ?The ASCVD Risk score  (Arnett DK, et al., 2019) failed to calculate for the following reasons: ?  The 2019 ASCVD risk score is only valid for ages 71 to 78 ?  The patient has a prior MI or stroke diagnosis ? ?--------------------------------------------------------------------------------------------------- ? ?Coronary artery disease, follow up ? ?The patient was last seen for CAD on 12/11/2020. ?Changes made at last visit include; none. ?He is not taking daily aspirin. ? ?He reports good compliance with treatment. ?He is not having side effects. none ?He is having to take nitroglycerine. ?He is experiencing none. ?He is not experiencing none. ? ?His last vist with his cardiologist was 08/12/2021 with Serafina Royals. ? ?Last metabolic panel ?Lab Results  ?Component Value Date  ? GLUCOSE 135 (H) 08/28/2021  ? NA 133 (L) 08/28/2021  ? K 4.2 08/28/2021  ? CL 104 08/28/2021  ? CO2 20 (L) 08/28/2021  ? BUN 40 (H) 08/28/2021  ? CREATININE 1.99 (H) 08/28/2021  ? GFRNONAA 31 (L) 08/28/2021  ? GFRAA 24 (L) 07/15/2019  ? CALCIUM 8.5 (L) 08/28/2021  ? PHOS 5.1 (H) 07/11/2019  ? PROT 6.8 06/20/2020  ? ALBUMIN 4.2 06/20/2020  ? LABGLOB 2.3 11/23/2018  ? AGRATIO 2.0 11/23/2018  ? BILITOT 0.6 06/20/2020  ? ALKPHOS 131 (H) 06/20/2020  ? AST 14 06/20/2020  ?  ALT 14 06/20/2020  ? ANIONGAP 9 08/28/2021  ? ? ?-----------------------------------------------------------------------------------------   ? ?Medications: ?Outpatient Medications Prior to Visit  ?Medication Sig  ? amLODipine (NORVASC) 5 MG tablet Take 5 mg by mouth daily.  ? apixaban (ELIQUIS) 2.5 MG TABS tablet Take 1 tablet (2.5 mg total) by mouth 2 (two) times daily.  ? atorvastatin (LIPITOR) 20 MG tablet Take 20 mg by mouth daily.   ? Biotin 5000 MCG CAPS Take 5,000 mcg by mouth daily.   ? Cholecalciferol (VITAMIN D3) 2000 UNITS capsule Take 2,000 Units by mouth 2 (two) times daily.   ? Coenzyme Q10 (COQ-10 PO) Take 200 mg by mouth daily.  ? Cyanocobalamin (VITAMIN B-12) 5000 MCG SUBL Place 5,000  mcg under the tongue daily.  ? dorzolamide-timolol (COSOPT) 22.3-6.8 MG/ML ophthalmic solution Place 1 drop into both eyes 2 (two) times daily.   ? isosorbide mononitrate (IMDUR) 60 MG 24 hr tablet Take 60 mg by mouth daily.   ? latanoprost (XALATAN) 0.005 % ophthalmic solution Place 1 drop into both eyes at bedtime.  ? Melatonin 5 MG CAPS Take 5 mg by mouth at bedtime as needed (for sleep).   ? metoprolol tartrate (LOPRESSOR) 50 MG tablet Take 0.5 tablets (25 mg total) by mouth 2 (two) times daily.  ? nitroGLYCERIN (NITROSTAT) 0.4 MG SL tablet Place 0.4 mg under the tongue every 5 (five) minutes as needed for chest pain.   ? omeprazole (PRILOSEC) 20 MG capsule Take 20 mg by mouth daily.   ? Polyethyl Glycol-Propyl Glycol (SYSTANE OP) Place 1 drop into the right eye daily as needed (burning).  ? tamsulosin (FLOMAX) 0.4 MG CAPS capsule TAKE 1 CAPSULE BY MOUTH ONCE DAILY  ? torsemide (DEMADEX) 20 MG tablet Take 10 mg by mouth daily.  ? TURMERIC PO Take 1,000 mg by mouth daily.  ? [DISCONTINUED] Multiple Vitamins-Minerals (PRESERVISION AREDS PO) Take 1 tablet by mouth 2 (two) times daily. (Patient not taking: Reported on 09/09/2021)  ? ?No facility-administered medications prior to visit.  ? ? ?Review of Systems  ?Constitutional:  Negative for appetite change, chills and fever.  ?Respiratory:  Negative for chest tightness, shortness of breath and wheezing.   ?Cardiovascular:  Negative for chest pain and palpitations.  ?Gastrointestinal:  Negative for abdominal pain, nausea and vomiting.  ? ?Last CBC ?Lab Results  ?Component Value Date  ? WBC 6.9 07/15/2019  ? HGB 11.7 (L) 07/15/2019  ? HCT 34.7 (L) 07/15/2019  ? MCV 89.9 07/15/2019  ? MCH 30.3 07/15/2019  ? RDW 12.8 07/15/2019  ? PLT 236 07/15/2019  ? ?  ?  Objective  ?  ?BP (!) 144/70 (BP Location: Right Arm, Patient Position: Sitting, Cuff Size: Normal)   Pulse 62   Temp 98.5 ?F (36.9 ?C) (Temporal)   Resp 18   Wt 158 lb (71.7 kg)   SpO2 98%   BMI 25.50 kg/m?   ?BP Readings from Last 3 Encounters:  ?09/09/21 (!) 144/70  ?08/28/21 107/64  ?07/12/21 (!) 119/55  ? ?Wt Readings from Last 3 Encounters:  ?09/09/21 158 lb (71.7 kg)  ?08/27/21 150 lb (68 kg)  ?07/12/21 151 lb 12.8 oz (68.9 kg)  ? ?  ? ?Physical Exam ?Vitals reviewed.  ?Constitutional:   ?   General: He is not in acute distress. ?   Appearance: He is well-developed.  ?HENT:  ?   Head: Normocephalic and atraumatic.  ?   Right Ear: Hearing normal.  ?   Left Ear: Hearing normal.  ?  Nose: Nose normal.  ?Eyes:  ?   General: Lids are normal. No scleral icterus.    ?   Right eye: No discharge.     ?   Left eye: No discharge.  ?   Conjunctiva/sclera: Conjunctivae normal.  ?Cardiovascular:  ?   Rate and Rhythm: Normal rate and regular rhythm.  ?   Heart sounds: Normal heart sounds.  ?Pulmonary:  ?   Effort: Pulmonary effort is normal. No respiratory distress.  ?   Comments: Decreased breath sounds in the left base ?Musculoskeletal:  ?   Right lower leg: No edema.  ?   Left lower leg: No edema.  ?   Comments: No swelling noted with support hose on.  ?Skin: ?   Findings: No lesion or rash.  ?Neurological:  ?   General: No focal deficit present.  ?   Mental Status: He is alert and oriented to person, place, and time.  ?Psychiatric:     ?   Mood and Affect: Mood normal.     ?   Speech: Speech normal.     ?   Behavior: Behavior normal.     ?   Thought Content: Thought content normal.     ?   Judgment: Judgment normal.  ?  ? ? ?No results found for any visits on 09/09/21. ? Assessment & Plan  ?  ? ?1. Coronary artery disease involving native coronary artery of native heart with unstable angina pectoris ([place]) ?All risk factors treated.  He sees cardiology in 2 days. ? ?2. Congestive heart failure, unspecified HF chronicity, unspecified heart failure type ([place]) ?Clinically the patient appears to have a little CHF volume overload.  I will continue his double Lasix dose until he sees cardiology in 2 days.  I am not pursuing any  further intervention today as he has had the symptoms for a few weeks.  He is comfortable with this plan.  Weight is up 8 pounds from his last visit. ?- DG Chest 2 View ? ?3. Benign essential HTN ?Follow blood press

## 2021-09-06 DIAGNOSIS — I495 Sick sinus syndrome: Secondary | ICD-10-CM | POA: Diagnosis not present

## 2021-09-06 DIAGNOSIS — Z950 Presence of cardiac pacemaker: Secondary | ICD-10-CM | POA: Diagnosis not present

## 2021-09-06 DIAGNOSIS — I480 Paroxysmal atrial fibrillation: Secondary | ICD-10-CM | POA: Diagnosis not present

## 2021-09-09 ENCOUNTER — Ambulatory Visit
Admission: RE | Admit: 2021-09-09 | Discharge: 2021-09-09 | Disposition: A | Discharge status: Home / Self Care | Payer: PPO | Attending: Family Medicine | Admitting: Family Medicine

## 2021-09-09 ENCOUNTER — Ambulatory Visit (INDEPENDENT_AMBULATORY_CARE_PROVIDER_SITE_OTHER): Payer: PPO | Admitting: Family Medicine

## 2021-09-09 ENCOUNTER — Ambulatory Visit
Admission: RE | Admit: 2021-09-09 | Discharge: 2021-09-09 | Disposition: A | Discharge status: Home / Self Care | Payer: PPO | Source: Ambulatory Visit | Attending: Family Medicine | Admitting: Family Medicine

## 2021-09-09 ENCOUNTER — Encounter: Payer: Self-pay | Admitting: Family Medicine

## 2021-09-09 VITALS — BP 144/70 | HR 62 | Temp 98.5°F | Resp 18 | Wt 158.0 lb

## 2021-09-09 DIAGNOSIS — I10 Essential (primary) hypertension: Secondary | ICD-10-CM

## 2021-09-09 DIAGNOSIS — I509 Heart failure, unspecified: Secondary | ICD-10-CM | POA: Insufficient documentation

## 2021-09-09 DIAGNOSIS — N1832 Chronic kidney disease, stage 3b: Secondary | ICD-10-CM

## 2021-09-09 DIAGNOSIS — I25110 Atherosclerotic heart disease of native coronary artery with unstable angina pectoris: Secondary | ICD-10-CM | POA: Diagnosis not present

## 2021-09-09 DIAGNOSIS — I482 Chronic atrial fibrillation, unspecified: Secondary | ICD-10-CM | POA: Diagnosis not present

## 2021-09-09 DIAGNOSIS — J90 Pleural effusion, not elsewhere classified: Secondary | ICD-10-CM | POA: Diagnosis not present

## 2021-09-09 DIAGNOSIS — I517 Cardiomegaly: Secondary | ICD-10-CM | POA: Diagnosis not present

## 2021-09-09 DIAGNOSIS — R7303 Prediabetes: Secondary | ICD-10-CM | POA: Diagnosis not present

## 2021-09-09 DIAGNOSIS — I255 Ischemic cardiomyopathy: Secondary | ICD-10-CM | POA: Diagnosis not present

## 2021-09-09 DIAGNOSIS — I495 Sick sinus syndrome: Secondary | ICD-10-CM | POA: Diagnosis not present

## 2021-09-11 DIAGNOSIS — R600 Localized edema: Secondary | ICD-10-CM | POA: Diagnosis not present

## 2021-09-11 DIAGNOSIS — I350 Nonrheumatic aortic (valve) stenosis: Secondary | ICD-10-CM | POA: Diagnosis not present

## 2021-09-11 DIAGNOSIS — I10 Essential (primary) hypertension: Secondary | ICD-10-CM | POA: Diagnosis not present

## 2021-09-11 DIAGNOSIS — I4892 Unspecified atrial flutter: Secondary | ICD-10-CM | POA: Diagnosis not present

## 2021-09-11 DIAGNOSIS — E782 Mixed hyperlipidemia: Secondary | ICD-10-CM | POA: Diagnosis not present

## 2021-09-11 DIAGNOSIS — I480 Paroxysmal atrial fibrillation: Secondary | ICD-10-CM | POA: Diagnosis not present

## 2021-09-11 DIAGNOSIS — I5032 Chronic diastolic (congestive) heart failure: Secondary | ICD-10-CM | POA: Diagnosis not present

## 2021-09-11 DIAGNOSIS — N184 Chronic kidney disease, stage 4 (severe): Secondary | ICD-10-CM | POA: Diagnosis not present

## 2021-09-11 DIAGNOSIS — N1832 Chronic kidney disease, stage 3b: Secondary | ICD-10-CM | POA: Diagnosis not present

## 2021-09-11 DIAGNOSIS — I25810 Atherosclerosis of coronary artery bypass graft(s) without angina pectoris: Secondary | ICD-10-CM | POA: Diagnosis not present

## 2021-09-26 DIAGNOSIS — R600 Localized edema: Secondary | ICD-10-CM | POA: Diagnosis not present

## 2021-09-26 DIAGNOSIS — I10 Essential (primary) hypertension: Secondary | ICD-10-CM | POA: Diagnosis not present

## 2021-09-26 DIAGNOSIS — N184 Chronic kidney disease, stage 4 (severe): Secondary | ICD-10-CM | POA: Diagnosis not present

## 2021-10-02 DIAGNOSIS — N184 Chronic kidney disease, stage 4 (severe): Secondary | ICD-10-CM | POA: Diagnosis not present

## 2021-10-02 DIAGNOSIS — I10 Essential (primary) hypertension: Secondary | ICD-10-CM | POA: Diagnosis not present

## 2021-10-02 DIAGNOSIS — R600 Localized edema: Secondary | ICD-10-CM | POA: Diagnosis not present

## 2021-10-04 DIAGNOSIS — H401122 Primary open-angle glaucoma, left eye, moderate stage: Secondary | ICD-10-CM | POA: Diagnosis not present

## 2021-10-09 DIAGNOSIS — I480 Paroxysmal atrial fibrillation: Secondary | ICD-10-CM | POA: Diagnosis not present

## 2021-10-14 DIAGNOSIS — H353211 Exudative age-related macular degeneration, right eye, with active choroidal neovascularization: Secondary | ICD-10-CM | POA: Diagnosis not present

## 2021-10-21 DIAGNOSIS — R600 Localized edema: Secondary | ICD-10-CM | POA: Diagnosis not present

## 2021-10-21 DIAGNOSIS — I10 Essential (primary) hypertension: Secondary | ICD-10-CM | POA: Diagnosis not present

## 2021-10-21 DIAGNOSIS — N184 Chronic kidney disease, stage 4 (severe): Secondary | ICD-10-CM | POA: Diagnosis not present

## 2021-10-24 DIAGNOSIS — I129 Hypertensive chronic kidney disease with stage 1 through stage 4 chronic kidney disease, or unspecified chronic kidney disease: Secondary | ICD-10-CM | POA: Diagnosis not present

## 2021-10-24 DIAGNOSIS — N184 Chronic kidney disease, stage 4 (severe): Secondary | ICD-10-CM | POA: Diagnosis not present

## 2021-10-24 DIAGNOSIS — R600 Localized edema: Secondary | ICD-10-CM | POA: Diagnosis not present

## 2021-10-29 DIAGNOSIS — M1712 Unilateral primary osteoarthritis, left knee: Secondary | ICD-10-CM | POA: Diagnosis not present

## 2021-11-01 ENCOUNTER — Other Ambulatory Visit: Payer: Self-pay | Admitting: Family Medicine

## 2021-11-14 DIAGNOSIS — I25810 Atherosclerosis of coronary artery bypass graft(s) without angina pectoris: Secondary | ICD-10-CM | POA: Diagnosis not present

## 2021-11-14 DIAGNOSIS — I480 Paroxysmal atrial fibrillation: Secondary | ICD-10-CM | POA: Diagnosis not present

## 2021-11-14 DIAGNOSIS — I350 Nonrheumatic aortic (valve) stenosis: Secondary | ICD-10-CM | POA: Diagnosis not present

## 2021-11-14 DIAGNOSIS — I739 Peripheral vascular disease, unspecified: Secondary | ICD-10-CM | POA: Diagnosis not present

## 2021-11-14 DIAGNOSIS — I10 Essential (primary) hypertension: Secondary | ICD-10-CM | POA: Diagnosis not present

## 2021-11-25 DIAGNOSIS — H353211 Exudative age-related macular degeneration, right eye, with active choroidal neovascularization: Secondary | ICD-10-CM | POA: Diagnosis not present

## 2022-01-13 DIAGNOSIS — H353211 Exudative age-related macular degeneration, right eye, with active choroidal neovascularization: Secondary | ICD-10-CM | POA: Diagnosis not present

## 2022-01-15 ENCOUNTER — Ambulatory Visit (INDEPENDENT_AMBULATORY_CARE_PROVIDER_SITE_OTHER): Payer: PPO | Admitting: Family Medicine

## 2022-01-15 ENCOUNTER — Encounter: Payer: Self-pay | Admitting: Family Medicine

## 2022-01-15 VITALS — BP 123/58 | HR 65 | Resp 16 | Ht 66.0 in | Wt 152.0 lb

## 2022-01-15 DIAGNOSIS — K219 Gastro-esophageal reflux disease without esophagitis: Secondary | ICD-10-CM | POA: Diagnosis not present

## 2022-01-15 DIAGNOSIS — I495 Sick sinus syndrome: Secondary | ICD-10-CM

## 2022-01-15 DIAGNOSIS — I255 Ischemic cardiomyopathy: Secondary | ICD-10-CM | POA: Diagnosis not present

## 2022-01-15 DIAGNOSIS — I482 Chronic atrial fibrillation, unspecified: Secondary | ICD-10-CM

## 2022-01-15 DIAGNOSIS — R29818 Other symptoms and signs involving the nervous system: Secondary | ICD-10-CM

## 2022-01-15 DIAGNOSIS — E7800 Pure hypercholesterolemia, unspecified: Secondary | ICD-10-CM

## 2022-01-15 DIAGNOSIS — Z0000 Encounter for general adult medical examination without abnormal findings: Secondary | ICD-10-CM | POA: Diagnosis not present

## 2022-01-15 DIAGNOSIS — N1832 Chronic kidney disease, stage 3b: Secondary | ICD-10-CM | POA: Diagnosis not present

## 2022-01-15 DIAGNOSIS — G4733 Obstructive sleep apnea (adult) (pediatric): Secondary | ICD-10-CM | POA: Diagnosis not present

## 2022-01-15 NOTE — Progress Notes (Signed)
Complete physical exam  I,April Miller,acting as a scribe for Wilhemena Durie, MD.,have documented all relevant documentation on the behalf of Wilhemena Durie, MD,as directed by  Wilhemena Durie, MD while in the presence of Wilhemena Durie, MD.   Patient: Austin [name]   DOB: [birth year]   88 y.o. Male  MRN: [id number] Visit Date: 01/15/2022  Today's healthcare provider: Wilhemena Durie, MD   Chief Complaint  Patient presents with   Annual Exam   Subjective    Austin [name] is a 88 y.o. male who presents today for a complete physical exam.  He reports consuming a general and low sodium diet. Home exercise routine includes exercise bike. He generally feels fairly well. He reports sleeping well. He does not have additional problems to discuss today.  He is to live independently with his wife 2 years.  Drove here today. HPI  Patient had AWV with [facility] on 05/21/2022.  Past Medical History:  Diagnosis Date   Absolute anemia 12/08/2014   Allergy    Anginal pain ([place])    much less angina d/t lack of activity   Arthritis    all over body   Atrial fibrillation ([place])    Bradycardia 05/03/2015   Calculus of kidney 12/08/2014   Coronary artery disease    GERD (gastroesophageal reflux disease)    Glaucoma    History of kidney stones    HLD (hyperlipidemia)    Hyperglycemia 08/27/2015   Hypertension    Macular degeneration    Myocardial infarction [facility]) 2011   Peripheral vascular disease ([place])    VHD (valvular heart disease)    Past Surgical History:  Procedure Laterality Date   ANTERIOR CERVICAL DECOMP/DISCECTOMY FUSION N/A 12/30/2017   Procedure: ANTERIOR CERVICAL DECOMPRESSION/DISCECTOMY FUSION 3 LEVELS- C3-6;  Surgeon: Meade Maw, MD;  Location: [facility] ORS;  Service: Neurosurgery;  Laterality: N/A;   CARDIAC CATHETERIZATION  2007   CARDIAC CATHETERIZATION N/A 04/17/2015   Procedure: Left Heart Cath and Coronary Angiography;  Surgeon: Corey Skains, MD;  Location: [place] CV LAB;  Service: Cardiovascular;  Laterality: N/A;   CARPAL TUNNEL RELEASE Bilateral    right 06/09/08 left 05/03/09   CATARACT EXTRACTION W/ INTRAOCULAR LENS  IMPLANT, BILATERAL     COLONOSCOPY N/A 10/02/2014   Procedure: COLONOSCOPY;  Surgeon: Manya Silvas, MD;  Location[facility] ENDOSCOPY;  Service: Endoscopy;  Laterality: N/A;   CORONARY ARTERY BYPASS GRAFT  2007   ESOPHAGOGASTRODUODENOSCOPY     EYE SURGERY     HEMORROIDECTOMY     JOINT REPLACEMENT Right 03/2017   partial knee replacement   KNEE ARTHROSCOPY Right 08/05/2016   Procedure: ARTHROSCOPY KNEE;  Surgeon: Thornton Park, MD;  Location: [facility] ORS;  Service: Orthopedics;  Laterality: Right;   KNEE ARTHROSCOPY WITH LATERAL RELEASE Right 08/05/2016   Procedure: KNEE ARTHROSCOPY WITH LATERAL RELEASE;  Surgeon: Thornton Park, MD;  Location: [facility] ORS;  Service: Orthopedics;  Laterality: Right;   KNEE ARTHROSCOPY WITH MEDIAL MENISECTOMY Right 08/05/2016   Procedure: KNEE ARTHROSCOPY WITH MEDIAL MENISECTOMY;  Surgeon: Thornton Park, MD;  Location: [facility] ORS;  Service: Orthopedics;  Laterality: Right;  Partial   LEFT HEART CATH AND CORONARY ANGIOGRAPHY Left 05/16/2021   Procedure: LEFT HEART CATH AND CORONARY ANGIOGRAPHY;  Surgeon: Corey Skains, MD;  Location: [place] [facility] LAB;  Service: Cardiovascular;  Laterality: Left;   LUMBAR EPIDURAL INJECTION  2011   LUMBAR LAMINECTOMY/DECOMPRESSION MICRODISCECTOMY N/A 03/10/2018   Procedure: LUMBAR LAMINECTOMY/DECOMPRESSION MICRODISCECTOMY  3 LEVELS-L2-5;  Surgeon: Meade Maw, MD;  Location: [facility] ORS;  Service: Neurosurgery;  Laterality: N/A;   MOUTH SURGERY     tooth implant   PACEMAKER LEADLESS INSERTION N/A 08/27/2021   Procedure: PACEMAKER LEADLESS INSERTION;  Surgeon: Isaias Cowman, MD;  Location: [place] [facility];  Service: Cardiovascular;  Laterality: N/A;   SHOULDER SURGERY Right 2011   rotator cuff repair   SYNOVECTOMY Right  08/05/2016   Procedure: SYNOVECTOMY;  Surgeon: Thornton Park, MD;  Location: [facility] ORS;  Service: Orthopedics;  Laterality: Right;  arthroscopic extensive synovectomy   TONSILLECTOMY     Social History   Socioeconomic History   Marital status: Married    Spouse name: Blanch Media   Number of children: 1   Years of education: Not on file   Highest education level: Some college, no degree  Occupational History   Occupation: Engineer, water: RETIRED  Tobacco Use   Smoking status: Former    Years: 15.00    Types: Cigarettes    Quit date: 1980    Years since quitting: 43.6   Smokeless tobacco: Never  Vaping Use   Vaping Use: Never used  Substance and Sexual Activity   Alcohol use: Yes    Alcohol/week: 3.0 standard drinks of alcohol    Types: 3 Glasses of wine per week   Drug use: No   Sexual activity: Not Currently  Other Topics Concern   Not on file  Social History Narrative   Not on file   Social Determinants of Health   Financial Resource Strain: Low Risk  (05/21/2021)   Overall Financial Resource Strain (CARDIA)    Difficulty of Paying Living Expenses: Not hard at all  Food Insecurity: No Food Insecurity (05/21/2021)   Hunger Vital Sign    Worried About Running Out of Food in the Last Year: Never true    Ran Out of Food in the Last Year: Never true  Transportation Needs: No Transportation Needs (05/21/2021)   PRAPARE - Hydrologist (Medical): No    Lack of Transportation (Non-Medical): No  Physical Activity: Sufficiently Active (05/21/2021)   Exercise Vital Sign    Days of Exercise per Week: 7 days    Minutes of Exercise per Session: 30 min  Stress: No Stress Concern Present (05/21/2021)   [place]    Feeling of Stress : Not at all  Social Connections: Moderately Integrated (05/21/2021)   Social Connection and Isolation Panel [NHANES]    Frequency  of Communication with Friends and Family: More than three times a week    Frequency of Social Gatherings with Friends and Family: Three times a week    Attends Religious Services: More than 4 times per year    Active Member of Clubs or Organizations: No    Attends Archivist Meetings: Never    Marital Status: Married  Human resources officer Violence: Not At Risk (05/21/2021)   Humiliation, Afraid, Rape, and Kick questionnaire    Fear of Current or Ex-Partner: No    Emotionally Abused: No    Physically Abused: No    Sexually Abused: No   Family Status  Relation Name Status   Mother  Deceased at age 51       cause of death MI and complications from CABG   Father  Deceased       CHF   Brother  Alive   Mat Uncle  Other       two uncles that died from MI in thier 45's   Brother  Deceased at age 31       cause of death MI   Brother  Deceased   Brother  Deceased at age 32   Brother  58   Family History  Problem Relation Age of Onset   Heart disease Mother    Heart disease Father    Heart disease Brother    Heart disease Brother    Heart disease Brother    Heart disease Brother    Heart disease Brother    Allergies  Allergen Reactions   Penicillins Itching, Swelling and Rash    Patient Care Team: Jerrol Banana., MD as PCP - General (Family Medicine) Lovell Sheehan, MD as Consulting Physician (Orthopedic Surgery) Corey Skains, MD as Consulting Physician (Cardiology) Leandrew Koyanagi, MD as Referring Physician (Ophthalmology) Lucky Cowboy Erskine Squibb, MD as Referring Physician (Vascular Surgery) Murlean Iba, MD (Nephrology)   Medications: Outpatient Medications Prior to Visit  Medication Sig   apixaban (ELIQUIS) 2.5 MG TABS tablet Take 1 tablet (2.5 mg total) by mouth 2 (two) times daily.   atorvastatin (LIPITOR) 20 MG tablet Take 20 mg by mouth daily.    Biotin 5000 MCG CAPS Take 5,000 mcg by mouth daily.    Cholecalciferol (VITAMIN D3) 2000 UNITS  capsule Take 2,000 Units by mouth 2 (two) times daily.    Coenzyme Q10 (COQ-10 PO) Take 200 mg by mouth daily.   Cyanocobalamin (VITAMIN B-12) 5000 MCG SUBL Place 5,000 mcg under the tongue daily.   dorzolamide-timolol (COSOPT) 22.3-6.8 MG/ML ophthalmic solution Place 1 drop into both eyes 2 (two) times daily.    isosorbide mononitrate (IMDUR) 60 MG 24 hr tablet Take 60 mg by mouth daily.    latanoprost (XALATAN) 0.005 % ophthalmic solution Place 1 drop into both eyes at bedtime.   Melatonin 5 MG CAPS Take 5 mg by mouth at bedtime as needed (for sleep).    metoprolol tartrate (LOPRESSOR) 50 MG tablet Take 0.5 tablets (25 mg total) by mouth 2 (two) times daily.   nitroGLYCERIN (NITROSTAT) 0.4 MG SL tablet Place 0.4 mg under the tongue every 5 (five) minutes as needed for chest pain.    omeprazole (PRILOSEC) 20 MG capsule Take 20 mg by mouth daily.    Polyethyl Glycol-Propyl Glycol (SYSTANE OP) Place 1 drop into the right eye daily as needed (burning).   tamsulosin (FLOMAX) 0.4 MG CAPS capsule TAKE 1 CAPSULE BY MOUTH ONCE DAILY   torsemide (DEMADEX) 20 MG tablet Take 10 mg by mouth daily.   TURMERIC PO Take 1,000 mg by mouth daily.   amLODipine (NORVASC) 5 MG tablet Take 5 mg by mouth daily. (Patient not taking: Reported on 01/15/2022)   No facility-administered medications prior to visit.    Review of Systems  All other systems reviewed and are negative.   Last lipids Lab Results  Component Value Date   CHOL 140 06/20/2020   HDL 42 06/20/2020   LDLCALC 84 06/20/2020   TRIG 72 06/20/2020   CHOLHDL 3.3 06/20/2020      Objective     BP (!) 123/58 (BP Location: Left Arm, Patient Position: Sitting, Cuff Size: Normal)   Pulse 65   Resp 16   Ht '5\' 6"'$  (1.676 m)   Wt 152 lb (68.9 kg)   SpO2 100%   BMI 24.53 kg/m  BP Readings from Last 3 Encounters:  01/15/22 (!) 123/58  09/09/21 (!) 144/70  08/28/21 107/64   Wt Readings from Last 3 Encounters:  01/15/22 152 lb (68.9 kg)   09/09/21 158 lb (71.7 kg)  08/27/21 150 lb (68 kg)       Physical Exam Vitals reviewed.  Constitutional:      General: He is not in acute distress.    Appearance: He is well-developed. He is not diaphoretic.  HENT:     Head: Normocephalic and atraumatic.     Right Ear: External ear normal.     Left Ear: External ear normal.     Nose: Nose normal.     Mouth/Throat:     Pharynx: No oropharyngeal exudate.  Eyes:     General: No scleral icterus.       Right eye: No discharge.        Left eye: No discharge.     Conjunctiva/sclera: Conjunctivae normal.     Pupils: Pupils are equal, round, and reactive to light.  Neck:     Thyroid: No thyromegaly.     Vascular: No JVD.     Trachea: No tracheal deviation.  Cardiovascular:     Rate and Rhythm: Normal rate and regular rhythm.     Heart sounds: Normal heart sounds. No murmur heard.    No friction rub. No gallop.  Pulmonary:     Effort: Pulmonary effort is normal. No respiratory distress.     Breath sounds: Normal breath sounds. No stridor. No wheezing or rales.  Chest:     Chest wall: No tenderness.  Abdominal:     General: Bowel sounds are normal. There is no distension.     Palpations: Abdomen is soft. There is no mass.     Tenderness: There is no abdominal tenderness. There is no guarding or rebound.  Musculoskeletal:        General: No tenderness or deformity. Normal range of motion.     Cervical back: Normal range of motion and neck supple.  Lymphadenopathy:     Cervical: No cervical adenopathy.  Skin:    General: Skin is warm and dry.     Coloration: Skin is not pale.     Findings: No erythema or rash.  Neurological:     General: No focal deficit present.     Mental Status: He is alert and oriented to person, place, and time.     Cranial Nerves: No cranial nerve deficit.     Motor: No abnormal muscle tone.     Coordination: Coordination normal.     Deep Tendon Reflexes: Reflexes are normal and symmetric. Reflexes  normal.  Psychiatric:        Mood and Affect: Mood normal.        Behavior: Behavior normal.        Thought Content: Thought content normal.        Judgment: Judgment normal.       Last depression screening scores    01/15/2022    9:39 AM 09/09/2021    8:57 AM 05/21/2021    8:30 AM  PHQ 2/9 Scores  PHQ - 2 Score 0 1 0  PHQ- 9 Score 1 4    Last fall risk screening    01/15/2022    9:38 AM  Fall Risk   Falls in the past year? 0  Number falls in past yr: 0  Injury with Fall? 0  Risk for fall due to : Impaired balance/gait;Impaired mobility  Follow up Falls evaluation completed   Last Audit-C alcohol  use screening    01/15/2022    9:38 AM  Alcohol Use Disorder Test (AUDIT)  1. How often do you have a drink containing alcohol? 2  2. How many drinks containing alcohol do you have on a typical day when you are drinking? 0  3. How often do you have six or more drinks on one occasion? 0  AUDIT-C Score 2   A score of 3 or more in women, and 4 or more in men indicates increased risk for alcohol abuse, EXCEPT if all of the points are from question 1   No results found for any visits on 01/15/22.  Assessment & Plan    Routine Health Maintenance and Physical Exam  Exercise Activities and Dietary recommendations  Goals      DIET - EAT MORE FRUITS AND VEGETABLES     Prevent falls     Recommend to remove any items from the home that may cause slips or trips.        Immunization History  Administered Date(s) Administered   Fluad Quad(high Dose 65+) 02/09/2019, 02/07/2020, 03/13/2021   Influenza, High Dose Seasonal PF 02/27/2016, 04/18/2017, 02/22/2018   Influenza-Unspecified 03/03/2015   PFIZER(Purple Top)SARS-COV-2 Vaccination 07/28/2019, 08/30/2019   Pneumococcal Conjugate-13 12/14/2013   Pneumococcal Polysaccharide-23 04/04/1998, 03/25/2006   Td 05/01/2008   Tdap 02/25/2011   Zoster Recombinat (Shingrix) 03/13/2020, 08/31/2020   Zoster, Live 10/07/2011    Health  Maintenance  Topic Date Due   COVID-19 Vaccine (3 - Pfizer risk series) 09/27/2019   TETANUS/TDAP  02/24/2021   INFLUENZA VACCINE  12/31/2021   Pneumonia Vaccine 6+ Years old  Completed   Zoster Vaccines- Shingrix  Completed   HPV VACCINES  Aged Out    Discussed health benefits of physical activity, and encouraged him to engage in regular exercise appropriate for his age and condition.  1. Annual physical exam No further screening testing. Patient still wishes to be full code  2. Sick sinus syndrome ([place]) Pacemaker in place  3. Ischemic cardiomyopathy Risk factors treated  4. Chronic atrial fibrillation (HCC)   5. Obstructive apnea  6. Gastroesophageal reflux disease, unspecified whether esophagitis present   7. Pure hypercholesterolemia   8. Neurogenic claudication   9. Stage 3b chronic kidney disease ([place]) By nephrology   No follow-ups on file.     I, Wilhemena Durie, MD, have reviewed all documentation for this visit. The documentation on 01/19/22 for the exam, diagnosis, procedures, and orders are all accurate and complete.    Adolpho Meenach Cranford Mon, MD  [facility] [phone] (phone) [phone] (fax)  [place]

## 2022-01-20 ENCOUNTER — Other Ambulatory Visit: Payer: Self-pay | Admitting: *Deleted

## 2022-01-20 DIAGNOSIS — R7303 Prediabetes: Secondary | ICD-10-CM

## 2022-01-20 DIAGNOSIS — N1832 Chronic kidney disease, stage 3b: Secondary | ICD-10-CM

## 2022-01-23 ENCOUNTER — Telehealth: Payer: Self-pay

## 2022-01-23 NOTE — Chronic Care Management (AMB) (Signed)
  Chronic Care Management   Outreach Note  01/23/2022 Name: Austin [name] MRN: [id number] DOB: 09/05/1928  Austin [name] is a 88 y.o. year old male who is a primary care patient of Jerrol Banana., MD. I reached out to Austin [name] by phone today in response to a referral sent by Austin [name]'s primary care provider.  An unsuccessful telephone outreach was attempted today. The patient was referred to the case management team for assistance with care management and care coordination.   Follow Up Plan: A HIPAA compliant phone message was left for the patient providing contact information and requesting a return call.  The care management team will reach out to the patient again over the next 7 days.  If patient returns call to provider office, please advise to call [facility] * at [phone]*  Noreene Larsson, [place], [place] [address] Direct Dial: [phone] [email or web address]

## 2022-01-30 DIAGNOSIS — M79645 Pain in left finger(s): Secondary | ICD-10-CM | POA: Diagnosis not present

## 2022-01-31 ENCOUNTER — Other Ambulatory Visit: Payer: Self-pay | Admitting: Family Medicine

## 2022-02-04 NOTE — Telephone Encounter (Signed)
Requested medication (s) are due for refill today: Yes  Requested medication (s) are on the active medication list: Yes  Last refill:  11/01/21  Future visit scheduled: No  Notes to clinic:  Protocol indicates lab work is needed.    Requested Prescriptions  Pending Prescriptions Disp Refills   tamsulosin (FLOMAX) 0.4 MG CAPS capsule [Pharmacy Med Name: TAMSULOSIN HCL 0.4 MG CAP] 90 capsule 0    Sig: TAKE 1 CAPSULE BY MOUTH ONCE DAILY     Urology: Alpha-Adrenergic Blocker Failed - 01/31/2022  2:14 PM      Failed - PSA in normal range and within 360 days    No results found for: "LABPSA", "PSA", "PSA1", "ULTRAPSA"       Passed - Last BP in normal range    BP Readings from Last 1 Encounters:  01/15/22 (!) 123/58         Passed - Valid encounter within last 12 months    Recent Outpatient Visits           2 weeks ago Annual physical exam   [facility] Jerrol Banana., MD   4 months ago Coronary artery disease involving native coronary artery of native heart with unstable angina pectoris [facility])   [facility] Jerrol Banana., MD   10 months ago Coronary artery disease involving native coronary artery of native heart with unstable angina pectoris [facility])   [facility] Jerrol Banana., MD   1 year ago Annual physical exam   [facility] Jerrol Banana., MD   1 year ago Benign essential HTN   [facility] Jerrol Banana., MD

## 2022-02-12 NOTE — Chronic Care Management (AMB) (Signed)
  Chronic Care Management   Outreach Note  02/12/2022 Name: Austin [name] MRN: [id number] DOB: 10-Jan-1929  Austin [name] is a 88 y.o. year old male who is a primary care patient of Jerrol Banana., MD. I reached out to Austin [name] by phone today in response to a referral sent by [name]'s primary care provider.  A second unsuccessful telephone outreach was attempted today. The patient was referred to the case management team for assistance with care management and care coordination.   Follow Up Plan: A HIPAA compliant phone message was left for the patient providing contact information and requesting a return call.  The care management team will reach out to the patient again over the next 7 days.  If patient returns call to provider office, please advise to call [facility] * at [phone]*  Noreene Larsson, [place],  [address] Direct Dial: [phone] [email or web address]

## 2022-02-13 NOTE — Chronic Care Management (AMB) (Signed)
  Chronic Care Management   Note  02/13/2022 Name: Austin [name] MRN: [id number] DOB: January 20, 1929  Austin [name] is a 88 y.o. year old male who is a primary care patient of Jerrol Banana., MD. I reached out to Avis Epley by phone today in response to a referral sent by Austin [name] PCP.  Austin [name] was given information about Chronic Care Management services today including:  CCM service includes personalized support from designated clinical staff supervised by his physician, including individualized plan of care and coordination with other care providers 24/7 contact phone numbers for assistance for urgent and routine care needs. Service will only be billed when office clinical staff spend 20 minutes or more in a month to coordinate care. Only one practitioner may furnish and bill the service in a calendar month. The patient may stop CCM services at any time (effective at the end of the month) by phone call to the office staff. The patient is responsible for co-pay (up to 20% after annual deductible is met) if co-pay is required by the individual health plan.   Patient agreed to services and verbal consent obtained.   Follow up plan: Telephone appointment with care management team member scheduled for:03/10/2022  Austin [name], [place], [place] [address] Direct Dial: [phone] [name]

## 2022-02-25 DIAGNOSIS — N184 Chronic kidney disease, stage 4 (severe): Secondary | ICD-10-CM | POA: Diagnosis not present

## 2022-02-25 DIAGNOSIS — R600 Localized edema: Secondary | ICD-10-CM | POA: Diagnosis not present

## 2022-02-25 DIAGNOSIS — I129 Hypertensive chronic kidney disease with stage 1 through stage 4 chronic kidney disease, or unspecified chronic kidney disease: Secondary | ICD-10-CM | POA: Diagnosis not present

## 2022-03-04 ENCOUNTER — Ambulatory Visit: Payer: PPO

## 2022-03-04 DIAGNOSIS — H353211 Exudative age-related macular degeneration, right eye, with active choroidal neovascularization: Secondary | ICD-10-CM | POA: Diagnosis not present

## 2022-03-05 ENCOUNTER — Ambulatory Visit (INDEPENDENT_AMBULATORY_CARE_PROVIDER_SITE_OTHER): Payer: PPO

## 2022-03-05 ENCOUNTER — Ambulatory Visit: Payer: PPO

## 2022-03-05 DIAGNOSIS — Z23 Encounter for immunization: Secondary | ICD-10-CM | POA: Diagnosis not present

## 2022-03-05 DIAGNOSIS — R600 Localized edema: Secondary | ICD-10-CM | POA: Diagnosis not present

## 2022-03-05 DIAGNOSIS — N184 Chronic kidney disease, stage 4 (severe): Secondary | ICD-10-CM | POA: Diagnosis not present

## 2022-03-05 DIAGNOSIS — I129 Hypertensive chronic kidney disease with stage 1 through stage 4 chronic kidney disease, or unspecified chronic kidney disease: Secondary | ICD-10-CM | POA: Diagnosis not present

## 2022-03-05 DIAGNOSIS — I10 Essential (primary) hypertension: Secondary | ICD-10-CM | POA: Diagnosis not present

## 2022-03-07 ENCOUNTER — Telehealth: Payer: Self-pay

## 2022-03-07 NOTE — Progress Notes (Signed)
Chronic Care Management Pharmacy Assistant   Name: JIMMIE RUETER  MRN: [id number] DOB: 12-20-1928  Reason for Encounter: Initial Visit Assessment for Telephone visit with Junius Argyle, CPP on 03/10/2022 @ 1100   Conditions to be addressed/monitored: Atrial Fibrillation, CAD, HTN, HLD, and Atherosclerosis of coronary artery, Hemorrhoid, Heart Valve Disease, MI, Cardiomyopathy, ischemic, PVD, Carotid Stenosis, Ventricular Premature Beats, Angina Pectoris, Atrial Flutter, Paroxysmal, Mild Aortic valve stenosis, Ischemic Myocardial Dysfunction, Ischemic Cardiomyopathy, Heart Valve Disorder, Moderate Mitral Valve Regurgitation, Ventricular Premature Complex, Sick sinus Syndrome, Acid Reflux, Chronic Pain Syndrome, Glaucoma,    Primary concerns for visit include: Patient Declined Appointment  Recent office visits:  01/15/2022 Miguel Aschoff, MD (PCP Office Visit) for Annual Physical Exam- No medication changes note, No orders placed, No follow-up noted  09/09/2021 Miguel Aschoff, MD (PCP Office Visit) for Follow-up- Stopped: Multiple Vitamins Minerals due to patient not taking, Chest XR Order placed, No follow-up noted  Recent consult visits:  03/05/2022 Murlean Iba, MD (Nephrology) for Follow-up- No medication changes noted, Lab orders placed, patient to follow-up in 3 months  01/30/2022 Danae Orleans, PA-C ([facility]) for Pain in finger of left hand- No medication changes noted, No orders placed, patient to follow-up prn  11/14/2021 Flossie Dibble, MD (Cardiology) for 2 month follow-up- No medication changes noted, No orders placed, Patient to follow-up in 1 year  10/24/2021 Murlean Iba, MD (Nephrology) for Follow-up- Patient ordered to continue to hold Amlodipine, No orders placed, Patient to follow-up in 4 months  09/26/2021 Murlean Iba, MD (Nephrology) for Follow-up- Valsartan-HCTZ on hold due to low normal BP, Metoprolol dose being adjusted by Cardio, Lab orders  placed, Patient to follow-up in 4 weeks  09/11/2021 Flossie Dibble, MD (Cardiology) for Hospital Follow-up- No medication changes noted, Cardiac Rehabilitation order placed, patient to follow-up in 2 months  Hospital visits:  None in previous 6 months  Medications: Outpatient Encounter Medications as of 03/07/2022  Medication Sig   amLODipine (NORVASC) 5 MG tablet Take 5 mg by mouth daily. (Patient not taking: Reported on 01/15/2022)   apixaban (ELIQUIS) 2.5 MG TABS tablet Take 1 tablet (2.5 mg total) by mouth 2 (two) times daily.   atorvastatin (LIPITOR) 20 MG tablet Take 20 mg by mouth daily.    Biotin 5000 MCG CAPS Take 5,000 mcg by mouth daily.    Cholecalciferol (VITAMIN D3) 2000 UNITS capsule Take 2,000 Units by mouth 2 (two) times daily.    Coenzyme Q10 (COQ-10 PO) Take 200 mg by mouth daily.   Cyanocobalamin (VITAMIN B-12) 5000 MCG SUBL Place 5,000 mcg under the tongue daily.   dorzolamide-timolol (COSOPT) 22.3-6.8 MG/ML ophthalmic solution Place 1 drop into both eyes 2 (two) times daily.    isosorbide mononitrate (IMDUR) 60 MG 24 hr tablet Take 60 mg by mouth daily.    latanoprost (XALATAN) 0.005 % ophthalmic solution Place 1 drop into both eyes at bedtime.   Melatonin 5 MG CAPS Take 5 mg by mouth at bedtime as needed (for sleep).    metoprolol tartrate (LOPRESSOR) 50 MG tablet Take 0.5 tablets (25 mg total) by mouth 2 (two) times daily.   nitroGLYCERIN (NITROSTAT) 0.4 MG SL tablet Place 0.4 mg under the tongue every 5 (five) minutes as needed for chest pain.    omeprazole (PRILOSEC) 20 MG capsule Take 20 mg by mouth daily.    Polyethyl Glycol-Propyl Glycol (SYSTANE OP) Place 1 drop into the right eye daily as needed (burning).   tamsulosin (FLOMAX) 0.4 MG CAPS capsule  TAKE 1 CAPSULE BY MOUTH ONCE DAILY   torsemide (DEMADEX) 20 MG tablet Take 10 mg by mouth daily.   TURMERIC PO Take 1,000 mg by mouth daily.   No facility-administered encounter medications on file as of  03/07/2022.   Care Gaps: Tetanus/TDAP  Star Rating Drugs: Atorvastatin 20 mg last filled on 12/14/2021 for a 90-Day supply with [facility]  Questions for Clinical Pharmacist:   1.Are you able to connect with Patient Yes     2.Confirmed appointment date/time with patient/caregiver? denied appointment on 03/10/2022 at 1100 with Riki Rusk, CPP.    3.Visit type telephone    After speaking with this patient, and providing in detail about CCM Services the patient decided at this time he would like to cancel the appointment as he felt this was  a service he did not need. Patient stated he follows-up with his PCP and all specialist providers and they take really good care of him.  Patient advised if something changes in the future he could have his PCP re-refer him back to the program.   CPP notified.       Lynann Bologna, CPA/CMA Clinical Pharmacist Assistant Phone: [phone]

## 2022-03-10 ENCOUNTER — Telehealth: Payer: PPO

## 2022-03-11 DIAGNOSIS — I495 Sick sinus syndrome: Secondary | ICD-10-CM | POA: Diagnosis not present

## 2022-03-31 ENCOUNTER — Encounter (INDEPENDENT_AMBULATORY_CARE_PROVIDER_SITE_OTHER): Payer: Self-pay

## 2022-04-01 DIAGNOSIS — H353211 Exudative age-related macular degeneration, right eye, with active choroidal neovascularization: Secondary | ICD-10-CM | POA: Diagnosis not present

## 2022-04-07 DIAGNOSIS — H401111 Primary open-angle glaucoma, right eye, mild stage: Secondary | ICD-10-CM | POA: Diagnosis not present

## 2022-04-14 DIAGNOSIS — M1712 Unilateral primary osteoarthritis, left knee: Secondary | ICD-10-CM | POA: Diagnosis not present

## 2022-04-29 DIAGNOSIS — H353211 Exudative age-related macular degeneration, right eye, with active choroidal neovascularization: Secondary | ICD-10-CM | POA: Diagnosis not present

## 2022-05-09 ENCOUNTER — Other Ambulatory Visit: Payer: Self-pay | Admitting: Family Medicine

## 2022-05-09 NOTE — Telephone Encounter (Signed)
Requested medication (s) are due for refill today:   Yes  Requested medication (s) are on the active medication list:   Yes  Future visit scheduled:   No   Had physical 3 mo. Ago by Dr. Rosanna Randy.   Last ordered: 02/04/2022 #90, 0 refills  Returned because PSA is due per protocol.   Requested Prescriptions  Pending Prescriptions Disp Refills   tamsulosin (FLOMAX) 0.4 MG CAPS capsule [Pharmacy Med Name: TAMSULOSIN HCL 0.4 MG CAP] 90 capsule 0    Sig: TAKE 1 CAPSULE BY MOUTH ONCE DAILY     Urology: Alpha-Adrenergic Blocker Failed - 05/09/2022 11:44 AM      Failed - PSA in normal range and within 360 days    No results found for: "LABPSA", "PSA", "PSA1", "ULTRAPSA"       Passed - Last BP in normal range    BP Readings from Last 1 Encounters:  01/15/22 (!) 123/58         Passed - Valid encounter within last 12 months    Recent Outpatient Visits           3 months ago Annual physical exam   [facility] Jerrol Banana., MD   8 months ago Coronary artery disease involving native coronary artery of native heart with unstable angina pectoris [facility])   [facility] Jerrol Banana., MD   1 year ago Coronary artery disease involving native coronary artery of native heart with unstable angina pectoris [facility])   [facility] Jerrol Banana., MD   1 year ago Annual physical exam   [facility] Jerrol Banana., MD   1 year ago Benign essential HTN   [facility] Jerrol Banana., MD

## 2022-05-13 ENCOUNTER — Ambulatory Visit: Payer: PPO | Admitting: Podiatry

## 2022-05-13 DIAGNOSIS — L97512 Non-pressure chronic ulcer of other part of right foot with fat layer exposed: Secondary | ICD-10-CM

## 2022-05-13 NOTE — Progress Notes (Signed)
Chief Complaint  Patient presents with   Blister     Patient is here for toe blister, open wound, third toe  right foot.    HPI: 88 y.o. male presenting today as a new patient for evaluation of an ulcer that developed to the right third toe.  Idiopathic onset.  Denies a history of injury.  Patient states that the toe was very tender and sore and the following day he noticed that it had broken open and draining white substance.  He presents for further treatment and evaluation  Past Medical History:  Diagnosis Date   Absolute anemia 12/08/2014   Allergy    Anginal pain ([place])    much less angina d/t lack of activity   Arthritis    all over body   Atrial fibrillation ([place])    Bradycardia 05/03/2015   Calculus of kidney 12/08/2014   Coronary artery disease    GERD (gastroesophageal reflux disease)    Glaucoma    History of kidney stones    HLD (hyperlipidemia)    Hyperglycemia 08/27/2015   Hypertension    Macular degeneration    Myocardial infarction [facility]) 2011   Peripheral vascular disease ([place])    VHD (valvular heart disease)     Past Surgical History:  Procedure Laterality Date   ANTERIOR CERVICAL DECOMP/DISCECTOMY FUSION N/A 12/30/2017   Procedure: ANTERIOR CERVICAL DECOMPRESSION/DISCECTOMY FUSION 3 LEVELS- C3-6;  Surgeon: Meade Maw, MD;  Location: [facility] ORS;  Service: Neurosurgery;  Laterality: N/A;   CARDIAC CATHETERIZATION  2007   CARDIAC CATHETERIZATION N/A 04/17/2015   Procedure: Left Heart Cath and Coronary Angiography;  Surgeon: Corey Skains, MD;  Location: [place] [facility] LAB;  Service: Cardiovascular;  Laterality: N/A;   CARPAL TUNNEL RELEASE Bilateral    right 06/09/08 left 05/03/09   CATARACT EXTRACTION W/ INTRAOCULAR LENS  IMPLANT, BILATERAL     COLONOSCOPY N/A 10/02/2014   Procedure: COLONOSCOPY;  Surgeon: Manya Silvas, MD;  Location: [facility] END[facility]COPY;  Service: Endoscopy;  Laterality: N/A;   CORONARY ARTERY BYPASS GRAFT  2007    ESOPHAGOGASTRODUODENOSCOPY     EYE SURGERY     HEMORROIDECTOMY     JOINT REPLACEMENT Right 03/2017   partial knee replacement   KNEE ARTHROSCOPY Right 08/05/2016   Procedure: ARTHROSCOPY KNEE;  Surgeon: Thornton Park, MD;  Location: [facility] ORS;  Service: Orthopedics;  Laterality: Right;   KNEE ARTHROSCOPY WITH LATERAL RELEASE Right 08/05/2016   Procedure: KNEE ARTHROSCOPY WITH LATERAL RELEASE;  Surgeon: Thornton Park, MD;  Location: [facility] ORS;  Service: Orthopedics;  Laterality: Right;   KNEE ARTHROSCOPY WITH MEDIAL MENISECTOMY Right 08/05/2016   Procedure: KNEE ARTHROSCOPY WITH MEDIAL MENISECTOMY;  Surgeon: Thornton Park, MD;  Location: [facility] ORS;  Service: Orthopedics;  Laterality: Right;  Partial   LEFT HEART CATH AND CORONARY ANGIOGRAPHY Left 05/16/2021   Procedure: LEFT HEART CATH AND CORONARY ANGIOGRAPHY;  Surgeon: Corey Skains, MD;  Location: [place] [facility] LAB;  Service: Cardiovascular;  Laterality: Left;   LUMBAR EPIDURAL INJECTION  2011   LUMBAR LAMINECTOMY/DECOMPRESSION MICRODISCECTOMY N/A 03/10/2018   Procedure: LUMBAR LAMINECTOMY/DECOMPRESSION MICRODISCECTOMY 3 LEVELS-L2-5;  Surgeon: Meade Maw, MD;  Location: [facility] ORS;  Service: Neurosurgery;  Laterality: N/A;   MOUTH SURGERY     tooth implant   PACEMAKER LEADLESS INSERTION N/A 08/27/2021   Procedure: PACEMAKER LEADLESS INSERTION;  Surgeon: Isaias Cowman, MD;  Location: [place] [facility];  Service: Cardiovascular;  Laterality: N/A;   SHOULDER SURGERY Right 2011   rotator cuff repair  SYNOVECTOMY Right 08/05/2016   Procedure: SYNOVECTOMY;  Surgeon: Thornton Park, MD;  Location: [facility] ORS;  Service: Orthopedics;  Laterality: Right;  arthroscopic extensive synovectomy   TONSILLECTOMY      Allergies  Allergen Reactions   Penicillins Itching, Swelling and Rash    RT foot 05/13/2022  Physical Exam: General: The patient is alert and oriented x3 in no acute distress.  Dermatology: Skin is warm, dry and  supple bilateral lower extremities. Negative for open lesions or macerations.  Ulcer noted to the dorsal aspect of the right third toe measuring approximately 0.7 x 0.7 x 0.2 cm.  Along the wound base there is granulation healthy bleeding tissue in combination with tophaceous gout.  Vascular: Palpable pedal pulses bilaterally. Capillary refill within normal limits.  Negative for any significant edema or erythema.  Clinically no concern for vascular compromise  Neurological: Light touch and protective threshold grossly intact  Musculoskeletal Exam: Bunion deformity with hammertoes of the lesser digits noted right foot   Assessment/plan of care: 1.  Ulcerated tophaceous gout right third toe -Medically necessary excisional debridement including subcutaneous tissue and gouty tophi was removed from the wound.  Healthy bleeding granular tissue noted -Recommend triple antibiotic and a light Band-Aid daily -Recommend shoes that do not irritate the toe. -Return to clinic 2 weeks    Edrick Kins, DPM [facility]  Dr. Edrick Kins, DPM    [address], [place] [address]                Office [phone]  Fax [phone]

## 2022-05-19 ENCOUNTER — Telehealth: Payer: Self-pay

## 2022-05-19 NOTE — Telephone Encounter (Signed)
Copied from [place] [id number]. Topic: Medicare AWV >> May 19, 2022  3:35 PM Leilani Able wrote: Reason for CRM: AWV Nurse 12/21 pt is cancelling can not make resh at [phone]

## 2022-06-05 ENCOUNTER — Ambulatory Visit (INDEPENDENT_AMBULATORY_CARE_PROVIDER_SITE_OTHER): Payer: PPO

## 2022-06-05 VITALS — Wt 152.0 lb

## 2022-06-05 DIAGNOSIS — Z0000 Encounter for general adult medical examination without abnormal findings: Secondary | ICD-10-CM | POA: Diagnosis not present

## 2022-06-05 DIAGNOSIS — I129 Hypertensive chronic kidney disease with stage 1 through stage 4 chronic kidney disease, or unspecified chronic kidney disease: Secondary | ICD-10-CM | POA: Diagnosis not present

## 2022-06-05 DIAGNOSIS — N184 Chronic kidney disease, stage 4 (severe): Secondary | ICD-10-CM | POA: Diagnosis not present

## 2022-06-05 DIAGNOSIS — I10 Essential (primary) hypertension: Secondary | ICD-10-CM | POA: Diagnosis not present

## 2022-06-05 DIAGNOSIS — R600 Localized edema: Secondary | ICD-10-CM | POA: Diagnosis not present

## 2022-06-05 NOTE — Patient Instructions (Signed)
[name] , Thank you for taking time to come for your Medicare Wellness Visit. I appreciate your ongoing commitment to your health goals. Please review the following plan we discussed and let me know if I can assist you in the future.   Screening recommendations/referrals: Colonoscopy: aged out Recommended yearly ophthalmology/optometry visit for glaucoma screening and checkup Recommended yearly dental visit for hygiene and checkup  Vaccinations: Influenza vaccine: 03/05/22 Pneumococcal vaccine: 12/14/13 Tdap vaccine: 02/25/11, due if have injury Shingles vaccine: Zostavax 10/07/11    Shingrix 03/13/20, 08/31/20   Covid-19: 07/28/19, 08/30/19  Advanced directives: no  Conditions/risks identified: none  Next appointment: Follow up in one year for your annual wellness visit. 06/09/23 @ 1pm by phone  Preventive Care 65 Years and Older, Male Preventive care refers to lifestyle choices and visits with your health care provider that can promote health and wellness. What does preventive care include? A yearly physical exam. This is also called an annual well check. Dental exams once or twice a year. Routine eye exams. Ask your health care provider how often you should have your eyes checked. Personal lifestyle choices, including: Daily care of your teeth and gums. Regular physical activity. Eating a healthy diet. Avoiding tobacco and drug use. Limiting alcohol use. Practicing safe sex. Taking low doses of aspirin every day. Taking vitamin and mineral supplements as recommended by your health care provider. What happens during an annual well check? The services and screenings done by your health care provider during your annual well check will depend on your age, overall health, lifestyle risk factors, and family history of disease. Counseling  Your health care provider may ask you questions about your: Alcohol use. Tobacco use. Drug use. Emotional well-being. Home and relationship  well-being. Sexual activity. Eating habits. History of falls. Memory and ability to understand (cognition). Work and work Statistician. Screening  You may have the following tests or measurements: Height, weight, and BMI. Blood pressure. Lipid and cholesterol levels. These may be checked every 5 years, or more frequently if you are over 31 years old. Skin check. Lung cancer screening. You may have this screening every year starting at age 63 if you have a 30-pack-year history of smoking and currently smoke or have quit within the past 15 years. Fecal occult blood test (FOBT) of the stool. You may have this test every year starting at age 32. Flexible sigmoidoscopy or colonoscopy. You may have a sigmoidoscopy every 5 years or a colonoscopy every 10 years starting at age 51. Prostate cancer screening. Recommendations will vary depending on your family history and other risks. Hepatitis C blood test. Hepatitis B blood test. Sexually transmitted disease (STD) testing. Diabetes screening. This is done by checking your blood sugar (glucose) after you have not eaten for a while (fasting). You may have this done every 1-3 years. Abdominal aortic aneurysm (AAA) screening. You may need this if you are a current or former smoker. Osteoporosis. You may be screened starting at age 6 if you are at high risk. Talk with your health care provider about your test results, treatment options, and if necessary, the need for more tests. Vaccines  Your health care provider may recommend certain vaccines, such as: Influenza vaccine. This is recommended every year. Tetanus, diphtheria, and acellular pertussis (Tdap, Td) vaccine. You may need a Td booster every 10 years. Zoster vaccine. You may need this after age 70. Pneumococcal 13-valent conjugate (PCV13) vaccine. One dose is recommended after age 27. Pneumococcal polysaccharide (PPSV23) vaccine. One dose  is recommended after age 34. Talk to your health care  provider about which screenings and vaccines you need and how often you need them. This information is not intended to replace advice given to you by your health care provider. Make sure you discuss any questions you have with your health care provider. Document Released: 06/15/2015 Document Revised: 02/06/2016 Document Reviewed: 03/20/2015 Elsevier Interactive Patient Education  2017 [place] Prevention in the Home Falls can cause injuries. They can happen to people of all ages. There are many things you can do to make your home safe and to help prevent falls. What can I do on the outside of my home? Regularly fix the edges of walkways and driveways and fix any cracks. Remove anything that might make you trip as you walk through a door, such as a raised step or threshold. Trim any bushes or trees on the path to your home. Use bright outdoor lighting. Clear any walking paths of anything that might make someone trip, such as rocks or tools. Regularly check to see if handrails are loose or broken. Make sure that both sides of any steps have handrails. Any raised decks and porches should have guardrails on the edges. Have any leaves, snow, or ice cleared regularly. Use sand or salt on walking paths during winter. Clean up any spills in your garage right away. This includes oil or grease spills. What can I do in the bathroom? Use night lights. Install grab bars by the toilet and in the tub and shower. Do not use towel bars as grab bars. Use non-skid mats or decals in the tub or shower. If you need to sit down in the shower, use a plastic, non-slip stool. Keep the floor dry. Clean up any water that spills on the floor as soon as it happens. Remove soap buildup in the tub or shower regularly. Attach bath mats securely with double-sided non-slip rug tape. Do not have throw rugs and other things on the floor that can make you trip. What can I do in the bedroom? Use night lights. Make  sure that you have a light by your bed that is easy to reach. Do not use any sheets or blankets that are too big for your bed. They should not hang down onto the floor. Have a firm chair that has side arms. You can use this for support while you get dressed. Do not have throw rugs and other things on the floor that can make you trip. What can I do in the kitchen? Clean up any spills right away. Avoid walking on wet floors. Keep items that you use a lot in easy-to-reach places. If you need to reach something above you, use a strong step stool that has a grab bar. Keep electrical cords out of the way. Do not use floor polish or wax that makes floors slippery. If you must use wax, use non-skid floor wax. Do not have throw rugs and other things on the floor that can make you trip. What can I do with my stairs? Do not leave any items on the stairs. Make sure that there are handrails on both sides of the stairs and use them. Fix handrails that are broken or loose. Make sure that handrails are as long as the stairways. Check any carpeting to make sure that it is firmly attached to the stairs. Fix any carpet that is loose or worn. Avoid having throw rugs at the top or bottom of the stairs.  If you do have throw rugs, attach them to the floor with carpet tape. Make sure that you have a light switch at the top of the stairs and the bottom of the stairs. If you do not have them, ask someone to add them for you. What else can I do to help prevent falls? Wear shoes that: Do not have high heels. Have rubber bottoms. Are comfortable and fit you well. Are closed at the toe. Do not wear sandals. If you use a stepladder: Make sure that it is fully opened. Do not climb a closed stepladder. Make sure that both sides of the stepladder are locked into place. Ask someone to hold it for you, if possible. Clearly mark and make sure that you can see: Any grab bars or handrails. First and last steps. Where the  edge of each step is. Use tools that help you move around (mobility aids) if they are needed. These include: Canes. Walkers. Scooters. Crutches. Turn on the lights when you go into a dark area. Replace any light bulbs as soon as they burn out. Set up your furniture so you have a clear path. Avoid moving your furniture around. If any of your floors are uneven, fix them. If there are any pets around you, be aware of where they are. Review your medicines with your doctor. Some medicines can make you feel dizzy. This can increase your chance of falling. Ask your doctor what other things that you can do to help prevent falls. This information is not intended to replace advice given to you by your health care provider. Make sure you discuss any questions you have with your health care provider. Document Released: 03/15/2009 Document Revised: 10/25/2015 Document Reviewed: 06/23/2014 Elsevier Interactive Patient Education  2017 [employer].

## 2022-06-05 NOTE — Progress Notes (Signed)
Virtual Visit via Telephone Note  I connected with  Austin [name] on 06/05/22 at  2:15 PM EST by telephone and verified that I am speaking with the correct person using two identifiers.  Location: Patient: home Provider: [facility] Persons participating in the virtual visit: [place]   I discussed the limitations, risks, security and privacy concerns of performing an evaluation and management service by telephone and the availability of in person appointments. The patient expressed understanding and agreed to proceed.  Interactive audio and video telecommunications were attempted between this nurse and patient, however failed, due to patient having technical difficulties OR patient did not have access to video capability.  We continued and completed visit with audio only.  Some vital signs may be absent or patient reported.   Dionisio David, LPN  Subjective:   Austin [name] is a 89 y.o. male who presents for Medicare Annual/Subsequent preventive examination.  Review of Systems     Cardiac Risk Factors include: advanced age (>55mn, >>7women);male gender;hypertension     Objective:    There were no vitals filed for this visit. There is no height or weight on file to calculate BMI.     06/05/2022    2:26 PM 08/27/2021    7:11 AM 05/21/2021    8:32 AM 05/16/2021    7:38 AM 05/08/2020   10:37 AM 07/09/2019    3:00 PM 07/09/2019   10:55 AM  Advanced Directives  Does Patient Have a Medical Advance Directive? No No No Yes Yes Yes Yes  Type of AScientist, research (medical)Living will [place] will Living will Living will  Does patient want to make changes to medical advance directive?      No - Patient declined   Copy of [place]?     No - copy requested    Would patient like information on creating a medical advance directive? No - Patient declined No - Patient declined No - Patient  declined   No - Patient declined No - Patient declined    Current Medications (verified) Outpatient Encounter Medications as of 06/05/2022  Medication Sig   atorvastatin (LIPITOR) 20 MG tablet Take 20 mg by mouth daily.    Biotin 5000 MCG CAPS Take 5,000 mcg by mouth daily.    Cholecalciferol (VITAMIN D3) 2000 UNITS capsule Take 2,000 Units by mouth 2 (two) times daily.    Coenzyme Q10 (COQ-10 PO) Take 200 mg by mouth daily.   Cyanocobalamin (VITAMIN B-12) 5000 MCG SUBL Place 5,000 mcg under the tongue daily.   dorzolamide-timolol (COSOPT) 22.3-6.8 MG/ML ophthalmic solution Place 1 drop into both eyes 2 (two) times daily.    isosorbide mononitrate (IMDUR) 60 MG 24 hr tablet Take 60 mg by mouth daily.    latanoprost (XALATAN) 0.005 % ophthalmic solution Place 1 drop into both eyes at bedtime.   Melatonin 5 MG CAPS Take 5 mg by mouth at bedtime as needed (for sleep).    metoprolol tartrate (LOPRESSOR) 50 MG tablet Take 0.5 tablets (25 mg total) by mouth 2 (two) times daily.   nitroGLYCERIN (NITROSTAT) 0.4 MG SL tablet Place 0.4 mg under the tongue every 5 (five) minutes as needed for chest pain.    omeprazole (PRILOSEC) 20 MG capsule Take 20 mg by mouth daily.    Polyethyl Glycol-Propyl Glycol (SYSTANE OP) Place 1 drop into the right eye daily as needed (burning).   tamsulosin (FLOMAX) 0.4  MG CAPS capsule TAKE 1 CAPSULE BY MOUTH ONCE DAILY   torsemide (DEMADEX) 20 MG tablet Take 10 mg by mouth daily.   TURMERIC PO Take 1,000 mg by mouth daily.   amLODipine (NORVASC) 5 MG tablet Take 5 mg by mouth daily. (Patient not taking: Reported on 06/05/2022)   apixaban (ELIQUIS) 2.5 MG TABS tablet Take 1 tablet (2.5 mg total) by mouth 2 (two) times daily.   No facility-administered encounter medications on file as of 06/05/2022.    Allergies (verified) Penicillins   History: Past Medical History:  Diagnosis Date   Absolute anemia 12/08/2014   Allergy    Anginal pain ([place])    much less angina d/t  lack of activity   Arthritis    all over body   Atrial fibrillation (HCC)    Bradycardia 05/03/2015   Calculus of kidney 12/08/2014   Coronary artery disease    GERD (gastroesophageal reflux disease)    Glaucoma    History of kidney stones    HLD (hyperlipidemia)    Hyperglycemia 08/27/2015   Hypertension    Macular degeneration    Myocardial infarction [facility]) 2011   Peripheral vascular disease ([place])    VHD (valvular heart disease)    Past Surgical History:  Procedure Laterality Date   ANTERIOR CERVICAL DECOMP/DISCECTOMY FUSION N/A 12/30/2017   Procedure: ANTERIOR CERVICAL DECOMPRESSION/DISCECTOMY FUSION 3 LEVELS- C3-6;  Surgeon: Meade Maw, MD;  Location: [facility] ORS;  Service: Neurosurgery;  Laterality: N/A;   CARDIAC CATHETERIZATION  2007   CARDIAC CATHETERIZATION N/A 04/17/2015   Procedure: Left Heart Cath and Coronary Angiography;  Surgeon: Corey Skains, MD;  Location: [place] CV LAB;  Service: Cardiovascular;  Laterality: N/A;   CARPAL TUNNEL RELEASE Bilateral    right 06/09/08 left 05/03/09   CATARACT EXTRACTION W/ INTRAOCULAR LENS  IMPLANT, BILATERAL     COLONOSCOPY N/A 10/02/2014   Procedure: COLONOSCOPY;  Surgeon: Manya Silvas, MD;  Location: [facility] END[facility]COPY;  Service: Endoscopy;  Laterality: N/A;   CORONARY ARTERY BYPASS GRAFT  2007   ESOPHAGOGASTRODUODENOSCOPY     EYE SURGERY     HEMORROIDECTOMY     JOINT REPLACEMENT Right 03/2017   partial knee replacement   KNEE ARTHROSCOPY Right 08/05/2016   Procedure: ARTHROSCOPY KNEE;  Surgeon: Thornton Park, MD;  Location: [facility] ORS;  Service: Orthopedics;  Laterality: Right;   KNEE ARTHROSCOPY WITH LATERAL RELEASE Right 08/05/2016   Procedure: KNEE ARTHROSCOPY WITH LATERAL RELEASE;  Surgeon: Thornton Park, MD;  Location: [facility] ORS;  Service: Orthopedics;  Laterality: Right;   KNEE ARTHROSCOPY WITH MEDIAL MENISECTOMY Right 08/05/2016   Procedure: KNEE ARTHROSCOPY WITH MEDIAL MENISECTOMY;  Surgeon: Thornton Park, MD;   Location: [facility] ORS;  Service: Orthopedics;  Laterality: Right;  Partial   LEFT HEART CATH AND CORONARY ANGIOGRAPHY Left 05/16/2021   Procedure: LEFT HEART CATH AND CORONARY ANGIOGRAPHY;  Surgeon: Corey Skains, MD;  Location: [place] CV LAB;  Service: Cardiovascular;  Laterality: Left;   LUMBAR EPIDURAL INJECTION  2011   LUMBAR LAMINECTOMY/DECOMPRESSION MICRODISCECTOMY N/A 03/10/2018   Procedure: LUMBAR LAMINECTOMY/DECOMPRESSION MICRODISCECTOMY 3 LEVELS-L2-5;  Surgeon: Meade Maw, MD;  Location: [facility] ORS;  Service: Neurosurgery;  Laterality: N/A;   MOUTH SURGERY     tooth implant   PACEMAKER LEADLESS INSERTION N/A 08/27/2021   Procedure: PACEMAKER LEADLESS INSERTION;  Surgeon: Isaias Cowman, MD;  Location: [place] CV LAB;  Service: Cardiovascular;  Laterality: N/A;   SHOULDER SURGERY Right 2011   rotator cuff repair   SYNOVECTOMY Right 08/05/2016  Procedure: SYNOVECTOMY;  Surgeon: Thornton Park, MD;  Location: [facility] ORS;  Service: Orthopedics;  Laterality: Right;  arthroscopic extensive synovectomy   TONSILLECTOMY     Family History  Problem Relation Age of Onset   Heart disease Mother    Heart disease Father    Heart disease Brother    Heart disease Brother    Heart disease Brother    Heart disease Brother    Heart disease Brother    Social History   Socioeconomic History   Marital status: Married    Spouse name: Blanch Media   Number of children: 1   Years of education: Not on file   Highest education level: Some college, no degree  Occupational History   Occupation: Engineer, water: RETIRED  Tobacco Use   Smoking status: Former    Years: 15.00    Types: Cigarettes    Quit date: 1980    Years since quitting: 44.0   Smokeless tobacco: Never  Vaping Use   Vaping Use: Never used  Substance and Sexual Activity   Alcohol use: Yes    Alcohol/week: 3.0 standard drinks of alcohol    Types: 3 Glasses of wine per week   Drug  use: No   Sexual activity: Not Currently  Other Topics Concern   Not on file  Social History Narrative   Not on file   Social Determinants of Health   Financial Resource Strain: Low Risk  (06/05/2022)   Overall Financial Resource Strain (CARDIA)    Difficulty of Paying Living Expenses: Not hard at all  Food Insecurity: No Food Insecurity (06/05/2022)   Hunger Vital Sign    Worried About Running Out of Food in the Last Year: Never true    Ran Out of Food in the Last Year: Never true  Transportation Needs: No Transportation Needs (06/05/2022)   PRAPARE - Hydrologist (Medical): No    Lack of Transportation (Non-Medical): No  Physical Activity: Sufficiently Active (06/05/2022)   Exercise Vital Sign    Days of Exercise per Week: 4 days    Minutes of Exercise per Session: 40 min  Stress: No Stress Concern Present (06/05/2022)   [place]    Feeling of Stress : Not at all  Social Connections: Moderately Integrated (06/05/2022)   Social Connection and Isolation Panel [NHANES]    Frequency of Communication with Friends and Family: More than three times a week    Frequency of Social Gatherings with Friends and Family: Twice a week    Attends Religious Services: More than 4 times per year    Active Member of [employer] or Organizations: No    Attends Music therapist: Never    Marital Status: Married    Tobacco Counseling Counseling given: Not Answered   Clinical Intake:  Pre-visit preparation completed: Yes  Pain : No/denies pain     Nutritional Risks: None Diabetes: No  How often do you need to have someone help you when you read instructions, pamphlets, or other written materials from your doctor or pharmacy?: 1 - Never  Diabetic?no  Interpreter Needed?: No  Information entered by :: Kirke Shaggy, LPN   Activities of Daily Living    06/05/2022    2:26 PM 01/15/2022     9:40 AM  In your present state of health, do you have any difficulty performing the following activities:  Hearing? 1 1  Vision? 0  1  Difficulty concentrating or making decisions? 0 0  Walking or climbing stairs? 0 0  Dressing or bathing? 0 0  Doing errands, shopping? 0 0  Preparing Food and eating ? N   Using the Toilet? N   In the past six months, have you accidently leaked urine? N   Do you have problems with loss of bowel control? N   Managing your Medications? N   Managing your Finances? N   Housekeeping or managing your Housekeeping? N     Patient Care Team: Mikey Kirschner, PA-C as PCP - General (Physician Assistant) Lovell Sheehan, MD as Consulting Physician (Orthopedic Surgery) Corey Skains, MD as Consulting Physician (Cardiology) Leandrew Koyanagi, MD as Referring Physician (Ophthalmology) Lucky Cowboy Erskine Squibb, MD as Referring Physician (Vascular Surgery) Murlean Iba, MD (Nephrology) Germaine Pomfret, [facility] (Pharmacist)  Indicate any recent Medical Services you may have received from other than [facility] providers in the past year (date may be approximate).     Assessment:   This is a routine wellness examination for Austin [name].  Hearing/Vision screen Hearing Screening - Comments:: Wears aids Vision Screening - Comments:: Wears glasses- [facility]  Dietary issues and exercise activities discussed: Current Exercise Habits: Home exercise routine, Type of exercise: walking, Time (Minutes): 40, Frequency (Times/Week): 4, Weekly Exercise (Minutes/Week): 160, Intensity: Mild   Goals Addressed             This Visit's Progress    DIET - EAT MORE FRUITS AND VEGETABLES         Depression Screen    06/05/2022    2:24 PM 01/15/2022    9:39 AM 09/09/2021    8:57 AM 05/21/2021    8:30 AM 12/11/2020   10:31 AM 05/08/2020   10:32 AM 04/20/2019    1:44 PM  PHQ 2/9 Scores  PHQ - 2 Score 0 0 1 0 0 0 0  PHQ- 9 Score 0 1 4        Fall Risk    06/05/2022    2:26 PM  01/15/2022    9:38 AM 09/09/2021    8:57 AM 05/21/2021    8:34 AM 12/11/2020   10:31 AM  Fall Risk   Falls in the past year? 0 0 0 1 0  Number falls in past yr: 0 0 0 0 0  Injury with Fall? 0 0 0 0 0  Risk for fall due to : No Fall Risks Impaired balance/gait;Impaired mobility No Fall Risks;Impaired balance/gait History of fall(s) No Fall Risks  Follow up Falls prevention discussed;Falls evaluation completed Falls evaluation completed Falls evaluation completed Falls prevention discussed Falls evaluation completed    FALL RISK PREVENTION PERTAINING TO THE HOME:  Any stairs in or around the home? No  If so, are there any without handrails? No  Home free of loose throw rugs in walkways, pet beds, electrical cords, etc? Yes  Adequate lighting in your home to reduce risk of falls? Yes   ASSISTIVE DEVICES UTILIZED TO PREVENT FALLS:  Life alert? No  Use of a cane, walker or w/c? Yes  Grab bars in the bathroom? Yes  Shower chair or bench in shower? Yes  Elevated toilet seat or a handicapped toilet? Yes    Cognitive Function:        06/05/2022    2:27 PM 05/08/2020   10:42 AM 04/20/2019    1:54 PM 03/05/2017    9:22 AM  6CIT Screen  What Year? 0 points 0 points 0 points  0 points  What month? 0 points 0 points 0 points 0 points  What time? 0 points 0 points 0 points 0 points  Count back from 20 0 points 0 points 0 points 0 points  Months in reverse 0 points 0 points 0 points 0 points  Repeat phrase 0 points 0 points 0 points 0 points  Total Score 0 points 0 points 0 points 0 points    Immunizations Immunization History  Administered Date(s) Administered   Fluad Quad(high Dose 65+) 02/09/2019, 02/07/2020, 03/13/2021, 03/05/2022   Influenza, High Dose Seasonal PF 02/27/2016, 04/18/2017, 02/22/2018   Influenza-Unspecified 03/03/2015   PFIZER(Purple Top)SARS-COV-2 Vaccination 07/28/2019, 08/30/2019   Pneumococcal Conjugate-13 12/14/2013   Pneumococcal Polysaccharide-23 04/04/1998,  03/25/2006   Td 05/01/2008   Tdap 02/25/2011   Zoster Recombinat (Shingrix) 03/13/2020, 08/31/2020   Zoster, Live 10/07/2011    TDAP status: Due, Education has been provided regarding the importance of this vaccine. Advised may receive this vaccine at local pharmacy or Health Dept. Aware to provide a copy of the vaccination record if obtained from local pharmacy or Health Dept. Verbalized acceptance and understanding.  Flu Vaccine status: Up to date  Pneumococcal vaccine status: Up to date  Covid-19 vaccine status: Completed vaccines  Qualifies for Shingles Vaccine? Yes   Zostavax completed Yes   Shingrix Completed?: Yes  Screening Tests Health Maintenance  Topic Date Due   COVID-19 Vaccine (3 - Pfizer risk series) 09/27/2019   DTaP/Tdap/Td (3 - Td or Tdap) 02/24/2021   Medicare Annual Wellness (AWV)  06/06/2023   Pneumonia Vaccine 72+ Years old  Completed   INFLUENZA VACCINE  Completed   Zoster Vaccines- Shingrix  Completed   HPV VACCINES  Aged Out    Health Maintenance  Health Maintenance Due  Topic Date Due   COVID-19 Vaccine (3 - Pfizer risk series) 09/27/2019   DTaP/Tdap/Td (3 - Td or Tdap) 02/24/2021    Colorectal cancer screening: No longer required.   Lung Cancer Screening: (Low Dose CT Chest recommended if Age 102-80 years, 30 pack-year currently smoking OR have quit w/in 15years.) does not qualify.    Additional Screening:  Hepatitis C Screening: does not qualify; Completed no  Vision Screening: Recommended annual ophthalmology exams for early detection of glaucoma and other disorders of the eye. Is the patient up to date with their annual eye exam?  Yes  Who is the provider or what is the name of the office in which the patient attends annual eye exams? [place] If pt is not established with a provider, would they like to be referred to a provider to establish care? No .   Dental Screening: Recommended annual dental exams for proper oral  hygiene  Community Resource Referral / Chronic Care Management: CRR required this visit?  No   CCM required this visit?  No      Plan:     I have personally reviewed and noted the following in the patient's chart:   Medical and social history Use of alcohol, tobacco or illicit drugs  Current medications and supplements including opioid prescriptions. Patient is not currently taking opioid prescriptions. Functional ability and status Nutritional status Physical activity Advanced directives List of other physicians Hospitalizations, surgeries, and ER visits in previous 12 months Vitals Screenings to include cognitive, depression, and falls Referrals and appointments  In addition, I have reviewed and discussed with patient certain preventive protocols, quality metrics, and best practice recommendations. A written personalized care plan for preventive services as well as general  preventive health recommendations were provided to patient.     Dionisio David, LPN   06/06/1832   Nurse Notes: none

## 2022-06-06 ENCOUNTER — Ambulatory Visit: Payer: PPO | Admitting: Podiatry

## 2022-06-06 DIAGNOSIS — L97512 Non-pressure chronic ulcer of other part of right foot with fat layer exposed: Secondary | ICD-10-CM | POA: Diagnosis not present

## 2022-06-06 NOTE — Progress Notes (Signed)
Chief Complaint  Patient presents with   Wound Check    rIGHT FOOT, 3RD DIGIT, NOT COMPLETELY HEALED BUT LOOKS GOOD     HPI: 89 y.o. male presenting today for follow-up evaluation of an ulcer that developed to the right third toe.  Idiopathic onset.  Denies a history of injury.  Patient noticed improvement overall.  He says it feels much better.  Past Medical History:  Diagnosis Date   Absolute anemia 12/08/2014   Allergy    Anginal pain ([place])    much less angina d/t lack of activity   Arthritis    all over body   Atrial fibrillation ([place])    Bradycardia 05/03/2015   Calculus of kidney 12/08/2014   Coronary artery disease    GERD (gastroesophageal reflux disease)    Glaucoma    History of kidney stones    HLD (hyperlipidemia)    Hyperglycemia 08/27/2015   Hypertension    Macular degeneration    Myocardial infarction [facility]) 2011   Peripheral vascular disease ([place])    VHD (valvular heart disease)     Past Surgical History:  Procedure Laterality Date   ANTERIOR CERVICAL DECOMP/DISCECTOMY FUSION N/A 12/30/2017   Procedure: ANTERIOR CERVICAL DECOMPRESSION/DISCECTOMY FUSION 3 LEVELS- C3-6;  Surgeon: Meade Maw, MD;  Location: [facility] ORS;  Service: Neurosurgery;  Laterality: N/A;   CARDIAC CATHETERIZATION  2007   CARDIAC CATHETERIZATION N/A 04/17/2015   Procedure: Left Heart Cath and Coronary Angiography;  Surgeon: Corey Skains, MD;  Location: [place] CV LAB;  Service: Cardiovascular;  Laterality: N/A;   CARPAL TUNNEL RELEASE Bilateral    right 06/09/08 left 05/03/09   CATARACT EXTRACTION W/ INTRAOCULAR LENS  IMPLANT, BILATERAL     COLONOSCOPY N/A 10/02/2014   Procedure: COLONOSCOPY;  Surgeon: Manya Silvas, MD;  Location: [facility] ENDOSCOPY;  Service: Endoscopy;  Laterality: N/A;   CORONARY ARTERY BYPASS GRAFT  2007   ESOPHAGOGASTRODUODENOSCOPY     EYE SURGERY     HEMORROIDECTOMY     JOINT REPLACEMENT Right 03/2017   partial knee replacement   KNEE ARTHROSCOPY  Right 08/05/2016   Procedure: ARTHROSCOPY KNEE;  Surgeon: Thornton Park, MD;  Location: [facility] ORS;  Service: Orthopedics;  Laterality: Right;   KNEE ARTHROSCOPY WITH LATERAL RELEASE Right 08/05/2016   Procedure: KNEE ARTHROSCOPY WITH LATERAL RELEASE;  Surgeon: Thornton Park, MD;  Location: [facility] ORS;  Service: Orthopedics;  Laterality: Right;   KNEE ARTHROSCOPY WITH MEDIAL MENISECTOMY Right 08/05/2016   Procedure: KNEE ARTHROSCOPY WITH MEDIAL MENISECTOMY;  Surgeon: Thornton Park, MD;  Location: [facility] ORS;  Service: Orthopedics;  Laterality: Right;  Partial   LEFT HEART CATH AND CORONARY ANGIOGRAPHY Left 05/16/2021   Procedure: LEFT HEART CATH AND CORONARY ANGIOGRAPHY;  Surgeon: Corey Skains, MD;  Location: [place] CV LAB;  Service: Cardiovascular;  Laterality: Left;   LUMBAR EPIDURAL INJECTION  2011   LUMBAR LAMINECTOMY/DECOMPRESSION MICRODISCECTOMY N/A 03/10/2018   Procedure: LUMBAR LAMINECTOMY/DECOMPRESSION MICRODISCECTOMY 3 LEVELS-L2-5;  Surgeon: Meade Maw, MD;  Location: [facility] ORS;  Service: Neurosurgery;  Laterality: N/A;   MOUTH SURGERY     tooth implant   PACEMAKER LEADLESS INSERTION N/A 08/27/2021   Procedure: PACEMAKER LEADLESS INSERTION;  Surgeon: Isaias Cowman, MD;  Location: [place] CV LAB;  Service: Cardiovascular;  Laterality: N/A;   SHOULDER SURGERY Right 2011   rotator cuff repair   SYNOVECTOMY Right 08/05/2016   Procedure: SYNOVECTOMY;  Surgeon: Thornton Park, MD;  Location: [facility] ORS;  Service: Orthopedics;  Laterality: Right;  arthroscopic extensive synovectomy  TONSILLECTOMY      Allergies  Allergen Reactions   Penicillins Itching, Swelling and Rash    RT foot 05/13/2022  Physical Exam: General: The patient is alert and oriented x3 in no acute distress.  Dermatology: There continues to be a wound overlying the DIPJ of the right third digit overall there is improvement.  It is much smaller.  No drainage.  Vascular: Palpable pedal pulses  bilaterally. Capillary refill within normal limits.  Negative for any significant edema or erythema.  Clinically no concern for vascular compromise  Neurological: Light touch and protective threshold grossly intact  Musculoskeletal Exam: Bunion deformity with hammertoes of the lesser digits noted right foot   Assessment/plan of care: 1.  Ulcerated tophaceous gout right third toe - Overall the patient feels significantly better.  He is very stable.  He has been applying triple antibiotic and a Band-Aid.  Continue. -Continue shoes that do not irritate the area -Return to clinic 4 weeks    Edrick Kins, DPM [facility]  Dr. Edrick Kins, DPM    [address], [place] [address]                Office [phone]  Fax [phone]

## 2022-06-09 DIAGNOSIS — N184 Chronic kidney disease, stage 4 (severe): Secondary | ICD-10-CM | POA: Diagnosis not present

## 2022-06-09 DIAGNOSIS — I129 Hypertensive chronic kidney disease with stage 1 through stage 4 chronic kidney disease, or unspecified chronic kidney disease: Secondary | ICD-10-CM | POA: Diagnosis not present

## 2022-06-09 DIAGNOSIS — R600 Localized edema: Secondary | ICD-10-CM | POA: Diagnosis not present

## 2022-06-12 ENCOUNTER — Inpatient Hospital Stay
Admission: EM | Admit: 2022-06-12 | Discharge: 2022-06-15 | DRG: 281 | Disposition: A | Discharge status: Home / Self Care | Payer: PPO | Attending: Internal Medicine | Admitting: Internal Medicine

## 2022-06-12 ENCOUNTER — Emergency Department: Payer: PPO

## 2022-06-12 ENCOUNTER — Other Ambulatory Visit: Payer: Self-pay

## 2022-06-12 DIAGNOSIS — R0602 Shortness of breath: Secondary | ICD-10-CM | POA: Diagnosis not present

## 2022-06-12 DIAGNOSIS — I4891 Unspecified atrial fibrillation: Secondary | ICD-10-CM | POA: Diagnosis not present

## 2022-06-12 DIAGNOSIS — D696 Thrombocytopenia, unspecified: Secondary | ICD-10-CM | POA: Diagnosis present

## 2022-06-12 DIAGNOSIS — I5030 Unspecified diastolic (congestive) heart failure: Secondary | ICD-10-CM | POA: Diagnosis not present

## 2022-06-12 DIAGNOSIS — I5042 Chronic combined systolic (congestive) and diastolic (congestive) heart failure: Secondary | ICD-10-CM | POA: Diagnosis not present

## 2022-06-12 DIAGNOSIS — G4733 Obstructive sleep apnea (adult) (pediatric): Secondary | ICD-10-CM | POA: Diagnosis present

## 2022-06-12 DIAGNOSIS — J90 Pleural effusion, not elsewhere classified: Secondary | ICD-10-CM | POA: Diagnosis not present

## 2022-06-12 DIAGNOSIS — R0789 Other chest pain: Secondary | ICD-10-CM

## 2022-06-12 DIAGNOSIS — R0600 Dyspnea, unspecified: Secondary | ICD-10-CM | POA: Diagnosis not present

## 2022-06-12 DIAGNOSIS — I4819 Other persistent atrial fibrillation: Secondary | ICD-10-CM | POA: Diagnosis present

## 2022-06-12 DIAGNOSIS — M48061 Spinal stenosis, lumbar region without neurogenic claudication: Secondary | ICD-10-CM | POA: Diagnosis present

## 2022-06-12 DIAGNOSIS — N179 Acute kidney failure, unspecified: Secondary | ICD-10-CM | POA: Diagnosis not present

## 2022-06-12 DIAGNOSIS — M5416 Radiculopathy, lumbar region: Secondary | ICD-10-CM | POA: Diagnosis present

## 2022-06-12 DIAGNOSIS — I495 Sick sinus syndrome: Secondary | ICD-10-CM | POA: Diagnosis not present

## 2022-06-12 DIAGNOSIS — Z981 Arthrodesis status: Secondary | ICD-10-CM

## 2022-06-12 DIAGNOSIS — N184 Chronic kidney disease, stage 4 (severe): Secondary | ICD-10-CM | POA: Diagnosis present

## 2022-06-12 DIAGNOSIS — I4892 Unspecified atrial flutter: Secondary | ICD-10-CM | POA: Diagnosis not present

## 2022-06-12 DIAGNOSIS — I130 Hypertensive heart and chronic kidney disease with heart failure and stage 1 through stage 4 chronic kidney disease, or unspecified chronic kidney disease: Secondary | ICD-10-CM | POA: Diagnosis not present

## 2022-06-12 DIAGNOSIS — I214 Non-ST elevation (NSTEMI) myocardial infarction: Principal | ICD-10-CM | POA: Diagnosis present

## 2022-06-12 DIAGNOSIS — R079 Chest pain, unspecified: Secondary | ICD-10-CM | POA: Diagnosis not present

## 2022-06-12 DIAGNOSIS — Z79899 Other long term (current) drug therapy: Secondary | ICD-10-CM

## 2022-06-12 DIAGNOSIS — I25110 Atherosclerotic heart disease of native coronary artery with unstable angina pectoris: Secondary | ICD-10-CM | POA: Diagnosis not present

## 2022-06-12 DIAGNOSIS — I255 Ischemic cardiomyopathy: Secondary | ICD-10-CM | POA: Diagnosis not present

## 2022-06-12 DIAGNOSIS — Z7982 Long term (current) use of aspirin: Secondary | ICD-10-CM

## 2022-06-12 DIAGNOSIS — M5116 Intervertebral disc disorders with radiculopathy, lumbar region: Secondary | ICD-10-CM | POA: Diagnosis present

## 2022-06-12 DIAGNOSIS — Z7901 Long term (current) use of anticoagulants: Secondary | ICD-10-CM

## 2022-06-12 DIAGNOSIS — Z951 Presence of aortocoronary bypass graft: Secondary | ICD-10-CM

## 2022-06-12 DIAGNOSIS — E785 Hyperlipidemia, unspecified: Secondary | ICD-10-CM | POA: Diagnosis present

## 2022-06-12 DIAGNOSIS — Z96651 Presence of right artificial knee joint: Secondary | ICD-10-CM | POA: Diagnosis present

## 2022-06-12 DIAGNOSIS — I252 Old myocardial infarction: Secondary | ICD-10-CM | POA: Diagnosis not present

## 2022-06-12 DIAGNOSIS — Z87891 Personal history of nicotine dependence: Secondary | ICD-10-CM | POA: Diagnosis not present

## 2022-06-12 DIAGNOSIS — Z880 Allergy status to penicillin: Secondary | ICD-10-CM

## 2022-06-12 DIAGNOSIS — K219 Gastro-esophageal reflux disease without esophagitis: Secondary | ICD-10-CM | POA: Diagnosis not present

## 2022-06-12 DIAGNOSIS — M48062 Spinal stenosis, lumbar region with neurogenic claudication: Secondary | ICD-10-CM | POA: Diagnosis present

## 2022-06-12 DIAGNOSIS — R0609 Other forms of dyspnea: Secondary | ICD-10-CM

## 2022-06-12 DIAGNOSIS — Z66 Do not resuscitate: Secondary | ICD-10-CM | POA: Diagnosis present

## 2022-06-12 DIAGNOSIS — G894 Chronic pain syndrome: Secondary | ICD-10-CM | POA: Diagnosis not present

## 2022-06-12 DIAGNOSIS — Z950 Presence of cardiac pacemaker: Secondary | ICD-10-CM

## 2022-06-12 DIAGNOSIS — I25700 Atherosclerosis of coronary artery bypass graft(s), unspecified, with unstable angina pectoris: Secondary | ICD-10-CM | POA: Diagnosis not present

## 2022-06-12 DIAGNOSIS — I400 Infective myocarditis: Secondary | ICD-10-CM | POA: Diagnosis not present

## 2022-06-12 DIAGNOSIS — M5136 Other intervertebral disc degeneration, lumbar region: Secondary | ICD-10-CM | POA: Diagnosis present

## 2022-06-12 DIAGNOSIS — I10 Essential (primary) hypertension: Secondary | ICD-10-CM | POA: Diagnosis not present

## 2022-06-12 DIAGNOSIS — Z8249 Family history of ischemic heart disease and other diseases of the circulatory system: Secondary | ICD-10-CM

## 2022-06-12 DIAGNOSIS — I482 Chronic atrial fibrillation, unspecified: Secondary | ICD-10-CM | POA: Diagnosis not present

## 2022-06-12 DIAGNOSIS — R7989 Other specified abnormal findings of blood chemistry: Secondary | ICD-10-CM | POA: Diagnosis not present

## 2022-06-12 LAB — BASIC METABOLIC PANEL
Anion gap: 12 (ref 5–15)
BUN: 56 mg/dL — ABNORMAL HIGH (ref 8–23)
CO2: 20 mmol/L — ABNORMAL LOW (ref 22–32)
Calcium: 8.4 mg/dL — ABNORMAL LOW (ref 8.9–10.3)
Chloride: 98 mmol/L (ref 98–111)
Creatinine, Ser: 2.56 mg/dL — ABNORMAL HIGH (ref 0.61–1.24)
GFR, Estimated: 23 mL/min — ABNORMAL LOW (ref >60)
Glucose, Bld: 141 mg/dL — ABNORMAL HIGH (ref 70–99)
Potassium: 3.8 mmol/L (ref 3.5–5.1)
Sodium: 130 mmol/L — ABNORMAL LOW (ref 135–145)

## 2022-06-12 LAB — CBC
HCT: 35.7 % — ABNORMAL LOW (ref 39.0–52.0)
Hemoglobin: 11.4 g/dL — ABNORMAL LOW (ref 13.0–17.0)
MCH: 30 pg (ref 26.0–34.0)
MCHC: 31.9 g/dL (ref 30.0–36.0)
MCV: 93.9 fL (ref 80.0–100.0)
Platelets: 121 10*3/uL — ABNORMAL LOW (ref 150–400)
RBC: 3.8 MIL/uL — ABNORMAL LOW (ref 4.22–5.81)
RDW: 14.9 % (ref 11.5–15.5)
WBC: 4.8 10*3/uL (ref 4.0–10.5)
nRBC: 0 % (ref 0.0–0.2)

## 2022-06-12 LAB — TROPONIN I (HIGH SENSITIVITY): Troponin I (High Sensitivity): 1474 ng/L (ref <18)

## 2022-06-12 LAB — PROTIME-INR
INR: 1.6 — ABNORMAL HIGH (ref 0.8–1.2)
Prothrombin Time: 19.2 seconds — ABNORMAL HIGH (ref 11.4–15.2)

## 2022-06-12 LAB — APTT: aPTT: 52 seconds — ABNORMAL HIGH (ref 24–36)

## 2022-06-12 MED ORDER — METOPROLOL TARTRATE 25 MG PO TABS
25.0000 mg | ORAL_TABLET | Freq: Two times a day (BID) | ORAL | Status: DC
  Administered 2022-06-12 – 2022-06-15 (×6): 25 mg via ORAL
  Filled 2022-06-12 (×6): qty 1

## 2022-06-12 MED ORDER — SENNOSIDES-DOCUSATE SODIUM 8.6-50 MG PO TABS: 1.0000 | ORAL_TABLET | Freq: Every evening | ORAL | Status: DC | PRN

## 2022-06-12 MED ORDER — NITROGLYCERIN 2 % TD OINT
1.0000 [in_us] | TOPICAL_OINTMENT | Freq: Four times a day (QID) | TRANSDERMAL | Status: DC | PRN
  Administered 2022-06-13: 1 [in_us] via TOPICAL
  Filled 2022-06-12: qty 1

## 2022-06-12 MED ORDER — ASPIRIN 81 MG PO CHEW
324.0000 mg | CHEWABLE_TABLET | Freq: Once | ORAL | Status: AC
  Administered 2022-06-12: 324 mg via ORAL
  Filled 2022-06-12: qty 4

## 2022-06-12 MED ORDER — MELATONIN 5 MG PO TABS: 5.0000 mg | ORAL_TABLET | Freq: Every evening | ORAL | Status: DC | PRN

## 2022-06-12 MED ORDER — PANTOPRAZOLE SODIUM 40 MG PO TBEC
40.0000 mg | DELAYED_RELEASE_TABLET | Freq: Every day | ORAL | Status: DC
  Administered 2022-06-13 – 2022-06-15 (×3): 40 mg via ORAL
  Filled 2022-06-12 (×3): qty 1

## 2022-06-12 MED ORDER — ACETAMINOPHEN 325 MG PO TABS: 650.0000 mg | ORAL_TABLET | Freq: Four times a day (QID) | ORAL | Status: DC | PRN

## 2022-06-12 MED ORDER — ISOSORBIDE MONONITRATE ER 60 MG PO TB24: 60.0000 mg | ORAL_TABLET | Freq: Every day | ORAL | Status: DC

## 2022-06-12 MED ORDER — LABETALOL HCL 5 MG/ML IV SOLN: 5.0000 mg | INTRAVENOUS | Status: DC | PRN

## 2022-06-12 MED ORDER — ONDANSETRON HCL 4 MG PO TABS: 4.0000 mg | ORAL_TABLET | Freq: Four times a day (QID) | ORAL | Status: DC | PRN

## 2022-06-12 MED ORDER — ONDANSETRON HCL 4 MG/2ML IJ SOLN
4.0000 mg | Freq: Four times a day (QID) | INTRAMUSCULAR | Status: DC | PRN
  Filled 2022-06-12: qty 2

## 2022-06-12 MED ORDER — ACETAMINOPHEN 650 MG RE SUPP: 650.0000 mg | Freq: Four times a day (QID) | RECTAL | Status: DC | PRN

## 2022-06-12 MED ORDER — ATORVASTATIN CALCIUM 20 MG PO TABS: 20.0000 mg | ORAL_TABLET | Freq: Every day | ORAL | Status: DC

## 2022-06-12 MED ORDER — HEPARIN (PORCINE) 25000 UT/250ML-% IV SOLN
1000.0000 [IU]/h | INTRAVENOUS | Status: DC
  Administered 2022-06-12: 800 [IU]/h via INTRAVENOUS
  Filled 2022-06-12: qty 250

## 2022-06-12 NOTE — H&P (Addendum)
History and Physical   Austin [name] LNL:[id number] DOB: April 10, 1929 DOA: 06/12/2022  PCP: Mikey Kirschner, PA-C  Outpatient Specialists: Dr. Nehemiah Massed, [facility] Cardiology Patient coming from: home via [place]  I have personally briefly reviewed patient's old medical records in [place].  Chief Concern: shortness of breath  HPI: [name] is a 89 year old male with history of CKD stage IV, CAD, hyperlipidemia, sick sinus syndrome, atrial fibrillation on Eliquis, who presents emergency department for chief concerns of shortness of breath with exertion and chest discomfort.  Initial vitals in the ED showed temperature of 97.9, respiration rate of 16, heart rate of 60, blood pressure 126/61, SpO2 100% on room air.  Serum sodium is 130, potassium 3.8, chloride 98, bicarb 20, BUN of 56, serum creatinine of 2.56, EGFR 23, nonfasting blood glucose 141, WBC 4.8, hemoglobin 11.4, platelets of 121.  High sensitive troponin was elevated at 1474.  ED treatment: Aspirin 324 mg p.o. one-time dose, heparin GTT per pharmacy. ---------------------------- At bedside patient was able to tell me his name, age, current calendar year and current location.  He reports that he has been having shortness of breath that has been ongoing for several months and states that it has worsened over the last week.  He states that shortness of breath is worse with exertion.  He denies lower extremity swelling.  He reports that this a.m. he woke up with chest pressure that has been persistent, thus prompting him to present to the emergency department for further evaluation.  He denies nausea, vomiting, abdominal pain, dysuria, hematuria, diarrhea, syncope, loss of consciousness.  Social history: He denies current tobacco use and quit about 40 years ago.  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  ROS: Constitutional: no weight change, no fever ENT/Mouth: no sore throat, no rhinorrhea Eyes: no eye pain,  no vision changes Cardiovascular: + chest pain, + dyspnea,  no edema, no palpitations Respiratory: no cough, no sputum, no wheezing Gastrointestinal: no nausea, no vomiting, no diarrhea, no constipation Genitourinary: no urinary incontinence, no dysuria, no hematuria Musculoskeletal: no arthralgias, no myalgias Skin: no skin lesions, no pruritus, Neuro: + weakness, no loss of consciousness, no syncope Psych: no anxiety, no depression, + decrease appetite Heme/Lymph: no bruising, no bleeding  ED Course: Discussed with emergency medicine provider, patient requiring hospitalization for chief concerns of NSTEMI.  Assessment/Plan  Principal Problem:   NSTEMI (non-ST elevated myocardial infarction) ([place]) Active Problems:   Benign essential HTN   Acid reflux   Obstructive apnea   Coronary artery disease involving native coronary artery of native heart with unstable angina pectoris (HCC)   Atrial fibrillation (HCC)   Hyperlipidemia   Sick sinus syndrome (HCC)   Assessment and Plan:  * NSTEMI (non-ST elevated myocardial infarction) ([place]) - Complete echo ordered - Heparin per pharmacy - Cardiology has been consulted via secure chat and epic order - Admit to telemetry cardiac, inpatient  Sick sinus syndrome [facility]) - Status postcardiac pacemaker placement   Hyperlipidemia - Atorvastatin 20 mg daily resumed  Atrial fibrillation ([place]) - Per med reconciliation patient is currently on Eliquis and his last Eliquis dose was a.m. of 06/12/2022 - Received message from pharmacy regarding heparin bolus versus reduced bolus versus just gtt. I advised pharmacist that given patient's CKD4 and last dose of Eliquis was at approximately 10-11 AM this a.m., I would recommend pharmacy to check PT, PTT, INR before determining appropriate heparin dosing  Coronary artery disease involving native coronary artery of native heart  with unstable angina pectoris (HCC) - Resumed atorvastatin 20 mg daily   Acid  reflux - PPI resumed  Benign essential HTN - Metoprolol tartrate 25 mg p.o. twice daily, isosorbide mononitrate 60 mg daily resumed - Labetalol 5 mg IV every 3 hours as needed for SBP greater than 175, 4 doses ordered  Code status -  I ask: "if your heart were to stop beating, is it okay for [place] to perform chest compressions also known as CPR?  "  Patient replies: "At this age I do not think so.  If God is calling me I would like to go peacefully "  I ask: "If you cannot breathe on your own is it okay for [place] to put a tube down your throat,  hook up to a machine to help breathe for you, also known as intubation? "  Patient replies: "No definitely not.  I do not want to be hooked up to machine to help breath for me or to keep me alive."  Chart reviewed.   DVT prophylaxis: Heparin GTT Code Status: DNR and DNI. Confirmed with patient with son in law at bedside Diet: Heart healthy now; n.p.o. after midnight Family Communication: updated son in law, John at beside with patient's permission Disposition Plan: Clinical course, cardiology evaluation Consults called: Cardiology Admission status: Telemetry cardiac, inpatient  Past Medical History:  Diagnosis Date   Absolute anemia 12/08/2014   Allergy    Anginal pain ([place])    much less angina d/t lack of activity   Arthritis    all over body   Atrial fibrillation ([place])    Bradycardia 05/03/2015   Calculus of kidney 12/08/2014   Coronary artery disease    GERD (gastroesophageal reflux disease)    Glaucoma    History of kidney stones    HLD (hyperlipidemia)    Hyperglycemia 08/27/2015   Hypertension    Macular degeneration    Myocardial infarction [facility]) 2011   Peripheral vascular disease ([place])    VHD (valvular heart disease)    Past Surgical History:  Procedure Laterality Date   ANTERIOR CERVICAL DECOMP/DISCECTOMY FUSION N/A 12/30/2017   Procedure: ANTERIOR CERVICAL DECOMPRESSION/DISCECTOMY FUSION 3 LEVELS- C3-6;  Surgeon:  Meade Maw, MD;  Location: [facility] ORS;  Service: Neurosurgery;  Laterality: N/A;   CARDIAC CATHETERIZATION  2007   CARDIAC CATHETERIZATION N/A 04/17/2015   Procedure: Left Heart Cath and Coronary Angiography;  Surgeon: Corey Skains, MD;  Location: [place] CV LAB;  Service: Cardiovascular;  Laterality: N/A;   CARPAL TUNNEL RELEASE Bilateral    right 06/09/08 left 05/03/09   CATARACT EXTRACTION W/ INTRAOCULAR LENS  IMPLANT, BILATERAL     COLONOSCOPY N/A 10/02/2014   Procedure: COLONOSCOPY;  Surgeon: Manya Silvas, MD;  Location: [facility] ENDOSCOPY;  Service: Endoscopy;  Laterality: N/A;   CORONARY ARTERY BYPASS GRAFT  2007   ESOPHAGOGASTRODUODENOSCOPY     EYE SURGERY     HEMORROIDECTOMY     JOINT REPLACEMENT Right 03/2017   partial knee replacement   KNEE ARTHROSCOPY Right 08/05/2016   Procedure: ARTHROSCOPY KNEE;  Surgeon: Thornton Park, MD;  Location: [facility] ORS;  Service: Orthopedics;  Laterality: Right;   KNEE ARTHROSCOPY WITH LATERAL RELEASE Right 08/05/2016   Procedure: KNEE ARTHROSCOPY WITH LATERAL RELEASE;  Surgeon: Thornton Park, MD;  Location: [facility] ORS;  Service: Orthopedics;  Laterality: Right;   KNEE ARTHROSCOPY WITH MEDIAL MENISECTOMY Right 08/05/2016   Procedure: KNEE ARTHROSCOPY WITH MEDIAL MENISECTOMY;  Surgeon: Thornton Park, MD;  Location: [facility] ORS;  Service:  Orthopedics;  Laterality: Right;  Partial   LEFT HEART CATH AND CORONARY ANGIOGRAPHY Left 05/16/2021   Procedure: LEFT HEART CATH AND CORONARY ANGIOGRAPHY;  Surgeon: Corey Skains, MD;  Location: [place] CV LAB;  Service: Cardiovascular;  Laterality: Left;   LUMBAR EPIDURAL INJECTION  2011   LUMBAR LAMINECTOMY/DECOMPRESSION MICRODISCECTOMY N/A 03/10/2018   Procedure: LUMBAR LAMINECTOMY/DECOMPRESSION MICRODISCECTOMY 3 LEVELS-L2-5;  Surgeon: Meade Maw, MD;  Location: [facility] ORS;  Service: Neurosurgery;  Laterality: N/A;   MOUTH SURGERY     tooth implant   PACEMAKER LEADLESS INSERTION N/A  08/27/2021   Procedure: PACEMAKER LEADLESS INSERTION;  Surgeon: Isaias Cowman, MD;  Location: [place] CV LAB;  Service: Cardiovascular;  Laterality: N/A;   SHOULDER SURGERY Right 2011   rotator cuff repair   SYNOVECTOMY Right 08/05/2016   Procedure: SYNOVECTOMY;  Surgeon: Thornton Park, MD;  Location: [facility] ORS;  Service: Orthopedics;  Laterality: Right;  arthroscopic extensive synovectomy   TONSILLECTOMY     Social History:  reports that he quit smoking about 44 years ago. His smoking use included cigarettes. He has never used smokeless tobacco. He reports current alcohol use of about 3.0 standard drinks of alcohol per week. He reports that he does not use drugs.  Allergies  Allergen Reactions   Penicillins Itching, Swelling and Rash   Family History  Problem Relation Age of Onset   Heart disease Mother    Heart disease Father    Heart disease Brother    Heart disease Brother    Heart disease Brother    Heart disease Brother    Heart disease Brother    Family history: Family history reviewed and not pertinent.  Prior to Admission medications   Medication Sig Start Date End Date Taking? Authorizing Provider  apixaban (ELIQUIS) 2.5 MG TABS tablet Take 1 tablet (2.5 mg total) by mouth 2 (two) times daily. 07/15/19 06/12/22 Yes Sreenath, Sudheer B, MD  atorvastatin (LIPITOR) 20 MG tablet Take 20 mg by mouth daily.  04/24/14  Yes [provider]  Biotin 5000 MCG CAPS Take 5,000 mcg by mouth daily.    Yes [provider]  Cholecalciferol (VITAMIN D3) 2000 UNITS capsule Take 2,000 Units by mouth 2 (two) times daily.    Yes [provider]  Coenzyme Q10 (COQ-10 PO) Take 200 mg by mouth daily.   Yes [provider]  Cyanocobalamin (VITAMIN B-12) 5000 MCG SUBL Place 5,000 mcg under the tongue daily.   Yes [provider]  dorzolamide-timolol (COSOPT) 22.3-6.8 MG/ML ophthalmic solution Place 1 drop into both eyes 2 (two) times daily.   07/03/15  Yes [provider]  isosorbide mononitrate (IMDUR) 60 MG 24 hr tablet Take 60 mg by mouth daily.  06/09/16  Yes [provider]  latanoprost (XALATAN) 0.005 % ophthalmic solution Place 1 drop into both eyes at bedtime. 07/23/21  Yes [provider]  Melatonin 5 MG CAPS Take 5 mg by mouth at bedtime as needed (for sleep).    Yes [provider]  metoprolol tartrate (LOPRESSOR) 50 MG tablet Take 0.5 tablets (25 mg total) by mouth 2 (two) times daily. 08/28/21  Yes Tang, Alanson Puls, PA-C  omeprazole (PRILOSEC) 20 MG capsule Take 20 mg by mouth daily.  07/28/15  Yes [provider]  Polyethyl Glycol-Propyl Glycol (SYSTANE OP) Place 1 drop into the right eye daily as needed (burning).   Yes [provider]  tamsulosin (FLOMAX) 0.4 MG CAPS capsule TAKE 1 CAPSULE BY MOUTH ONCE DAILY 05/09/22  Yes Mikey Kirschner, PA-C  torsemide (DEMADEX) 20 MG tablet Take 10 mg by mouth daily. 12/02/19  Yes [provider]  TURMERIC PO Take 1,000 mg by mouth daily.   Yes [provider]  amLODipine (NORVASC) 5 MG tablet Take 5 mg by mouth daily. Patient not taking: Reported on 06/12/2022 07/02/21   [provider]  nitroGLYCERIN (NITROSTAT) 0.4 MG SL tablet Place 0.4 mg under the tongue every 5 (five) minutes as needed for chest pain.  02/25/11   [provider]   Physical Exam: Vitals:   06/12/22 1509 06/12/22 1630 06/12/22 1730  BP: 126/61 (!) 140/73 131/75  Pulse: 60 (!) 116 67  Resp: 16 17 (!) 21  Temp: 97.9 F (36.6 C)    TempSrc: Oral    SpO2: 100% (!) 80% 100%  Weight: 68 kg    Height: '5\' 6"'$  (1.676 m)     Constitutional: appears age appropriate, NAD, calm, comfortable Eyes: PERRL, lids and conjunctivae normal ENMT: Mucous membranes are moist. Posterior pharynx clear of any exudate or lesions. Age-appropriate dentition. Hearing appropriate Neck: normal, supple, no masses, no thyromegaly Respiratory: clear to  auscultation bilaterally, no wheezing, no crackles. Normal respiratory effort. No accessory muscle use.  Cardiovascular: Regular rate and rhythm, no murmurs / rubs / gallops. No extremity edema. 2+ pedal pulses. No carotid bruits.  Abdomen: no tenderness, no masses palpated, no hepatosplenomegaly. Bowel sounds positive.  Musculoskeletal: no clubbing / cyanosis. No joint deformity upper and lower extremities. Good ROM, no contractures, no atrophy. Normal muscle tone.  Skin: no rashes, lesions, ulcers. No induration Neurologic: Sensation intact. Strength 5/5 in all 4.  Psychiatric: Normal judgment and insight. Alert and oriented x 3. Normal mood.   EKG: independently reviewed, showing atrial flutter with rate of 62, QTc 397  Chest x-ray on Admission: I personally reviewed and I agree with radiologist reading as below.  DG Chest 2 View  Result Date: 06/12/2022 CLINICAL DATA:  Chest pain. EXAM: CHEST - 2 VIEW COMPARISON:  September 09, 2021. FINDINGS: Mild cardiomegaly is noted. Status post coronary bypass graft. Minimal bilateral pleural effusions are noted. No consolidative process is noted. Bony thorax is unremarkable. IMPRESSION: Minimal bilateral pleural effusions. Status post coronary bypass graft. Electronically Signed   By: Marijo Conception M.D.   On: 06/12/2022 16:22    Labs on Admission: I have personally reviewed following labs  CBC: Recent Labs  Lab 06/12/22 1514  WBC 4.8  HGB 11.4*  HCT 35.7*  MCV 93.9  PLT 962*   Basic Metabolic Panel: Recent Labs  Lab 06/12/22 1514  NA 130*  K 3.8  CL 98  CO2 20*  GLUCOSE 141*  BUN 56*  CREATININE 2.56*  CALCIUM 8.4*   GFR: Estimated Creatinine Clearance: 17 mL/min (A) (by C-G formula based on SCr of 2.56 mg/dL (H)).  Coagulation Profile: Recent Labs  Lab 06/12/22 1514  INR 1.6*   Urine analysis:    Component Value Date/Time   COLORURINE YELLOW (A) 07/13/2019 1406   APPEARANCEUR CLEAR (A) 07/13/2019 1406   LABSPEC 1.010  07/13/2019 1406   PHURINE 5.0 07/13/2019 1406   GLUCOSEU NEGATIVE 07/13/2019 1406   HGBUR NEGATIVE 07/13/2019 1406   [place] 07/13/2019 1406   [place] 07/13/2019 1406   PROTEINUR NEGATIVE 07/13/2019 1406   NITRITE NEGATIVE 07/13/2019 1406   LEUKOCYTESUR NEGATIVE 07/13/2019 1406   CRITICAL CARE Performed by: Dr. Tobie Poet  Total critical care time: 35 minutes  Critical care time was exclusive of  separately billable procedures and treating other patients.  Critical care was necessary to treat or prevent imminent or life-threatening deterioration.  Critical care was time spent personally by me on the following activities: development of treatment plan with patient and/or surrogate as well as nursing, discussions with consultants, evaluation of patient's response to treatment, examination of patient, obtaining history from patient or surrogate, ordering and performing treatments and interventions, ordering and review of laboratory studies, ordering and review of radiographic studies, pulse oximetry and re-evaluation of patient's condition.  This document was prepared using Dragon Voice Recognition software and may include unintentional dictation errors.  Dr. Tobie Poet [facility]  If 7PM-7AM, please contact overnight-coverage provider If 7AM-7PM, please contact day coverage provider [email or web address]  06/12/2022, 6:12 PM

## 2022-06-12 NOTE — Hospital Course (Addendum)
Austin [name] is a 89 year old male with history of CKD stage IV, CAD, hyperlipidemia, sick sinus syndrome, atrial fibrillation on Eliquis, who presented to the ED on 06/12/2022 for evaluation of shortness of breath with exertion and chest discomfort. He was found to have hs-troponin elevation at 1474. Admitted to medicine service with cardiology consulted for further evaluation and management of NSTEMI.  Started on heparin drip.

## 2022-06-12 NOTE — Progress Notes (Addendum)
ANTICOAGULATION CONSULT NOTE - Initial Consult  Pharmacy Consult for heparin infusion  Indication: chest pain/ACS  Allergies  Allergen Reactions   Penicillins Itching, Swelling and Rash    Patient Measurements: Height: '5\' 6"'$  (167.6 cm) Weight: 68 kg (150 lb) IBW/kg (Calculated) : 63.8  Vital Signs: Temp: 97.9 F (36.6 C) (01/11 1509) Temp Source: Oral (01/11 1509) BP: 140/73 (01/11 1630) Pulse Rate: 116 (01/11 1630)  Labs: Recent Labs    06/12/22 1514  HGB 11.4*  HCT 35.7*  PLT 121*  CREATININE 2.56*  TROPONINIHS 1,474*    Estimated Creatinine Clearance: 17 mL/min (A) (by C-G formula based on SCr of 2.56 mg/dL (H)).   Medical History: Past Medical History:  Diagnosis Date   Absolute anemia 12/08/2014   Allergy    Anginal pain (HCC)    much less angina d/t lack of activity   Arthritis    all over body   Atrial fibrillation (HCC)    Bradycardia 05/03/2015   Calculus of kidney 12/08/2014   Coronary artery disease    GERD (gastroesophageal reflux disease)    Glaucoma    History of kidney stones    HLD (hyperlipidemia)    Hyperglycemia 08/27/2015   Hypertension    Macular degeneration    Myocardial infarction [facility]) 2011   Peripheral vascular disease (HCC)    VHD (valvular heart disease)     Medications:  Apixaban 2.'5mg'$  BID PTA  Assessment: 89 yo male admitted with shortness of breath and chest pressure. Pharmacy has been consulted for heparin infusion dosing and monitoring for NSTEMI. Patient has PMH of A. Fib, taking apixaban 2.'5mg'$  prior to admission. Last dose reported at 1100 this morning.   HS Troponin: 1474   Goal of Therapy:  Heparin level 0.3-0.7 units/ml aPTT 66-102 seconds Monitor platelets by anticoagulation protocol: Yes   Plan:  Patient took apixaban 2.'5mg'$  this morning.  Attempted to discuss reducing or holding heparin bolus with provider.   Per protocol will hold heparin bolus for high risk for bleeding.  Start heparin infusion at  800 units/hr Will need to monitor aPTT levels until aPTT and anti-Xa (heparin levels) correlate.  Check aPTT level in 8 hours and anti-Xa level in daily while on heparin Continue to monitor H&H and platelets   Pernell Dupre, PharmD, BCPS Clinical Pharmacist 06/12/2022 4:59 PM

## 2022-06-12 NOTE — ED Triage Notes (Signed)
Pt to ED via POV from home. Pt reports SOB, loss of appetite and weakness. Pt also reports he feels like his afib his acting up and is causing chest pressure. Pt is on Eliquis.

## 2022-06-12 NOTE — Assessment & Plan Note (Addendum)
Continue metoprolol, Imdur IV Labetalol PRN

## 2022-06-12 NOTE — ED Provider Notes (Signed)
[facility] Provider Note    Event Date/Time   First MD Initiated Contact with Patient 06/12/22 1616     (approximate)   History   Shortness of Breath   HPI  Austin [name] is a 89 y.o. male   Past medical history of atrial fibrillation on Eliquis, CAD, CKD, potential hyperlipidemia presents the emergency department with 1 week of exertional dyspnea, dry cough, and this morning awoke with chest pressure.  Mild nausea no vomiting.  Is been fully compliant with Eliquis.  Peripheral edema.  No orthopnea.  No fever or chills.  History was obtained via the patient.  I reviewed external medical notes including cardiology visit dated June 2023 Dr. Jose Persia reviewed anticoagulation for A-fib, continue on Eliquis.      Physical Exam   Triage Vital Signs: ED Triage Vitals  Enc Vitals Group     BP 06/12/22 1509 126/61     Pulse Rate 06/12/22 1509 60     Resp 06/12/22 1509 16     Temp 06/12/22 1509 97.9 F (36.6 C)     Temp Source 06/12/22 1509 Oral     SpO2 06/12/22 1509 100 %     Weight 06/12/22 1509 150 lb (68 kg)     Height 06/12/22 1509 '5\' 6"'$  (1.676 m)     Head Circumference --      Peak Flow --      Pain Score 06/12/22 1513 2     Pain Loc --      Pain Edu? --      Excl. in [place]? --     Most recent vital signs: Vitals:   06/12/22 1509 06/12/22 1630  BP: 126/61 (!) 140/73  Pulse: 60 (!) 116  Resp: 16 17  Temp: 97.9 F (36.6 C)   SpO2: 100% (!) 80%    General: Awake, no distress.  CV:  Good peripheral perfusion.  Resp:  Normal effort.  Abd:  No distention.  Other:  Awake alert comfortable appearing.  Hemodynamics appropriate reassuring no oximeter.  Skin appears warm well-perfused abdomen soft nontender lungs clear without wheezing focality.   ED Results / Procedures / Treatments   Labs (all labs ordered are listed, but only abnormal results are displayed) Labs Reviewed  BASIC METABOLIC PANEL - Abnormal; Notable for the  following components:      Result Value   Sodium 130 (*)    CO2 20 (*)    Glucose, Bld 141 (*)    BUN 56 (*)    Creatinine, Ser 2.56 (*)    Calcium 8.4 (*)    GFR, Estimated 23 (*)    All other components within normal limits  CBC - Abnormal; Notable for the following components:   RBC 3.80 (*)    Hemoglobin 11.4 (*)    HCT 35.7 (*)    Platelets 121 (*)    All other components within normal limits  TROPONIN I (HIGH SENSITIVITY) - Abnormal; Notable for the following components:   Troponin I (High Sensitivity) 1,474 (*)    All other components within normal limits     I reviewed labs and they are notable for markedly elevated troponin of 1400.  AKI 2.5 creatinine from baseline the ones.  EKG  ED ECG REPORT I, Lucillie Garfinkel, the attending physician, personally viewed and interpreted this ECG.   Date: 06/12/2022  EKG Time: 1513  Rate: 62  Rhythm: atrial fibrillation, rate 60s occasionally v paced  Axis: nl  Intervals:A flutter  ST&T Change: no stemi    RADIOLOGY I independently reviewed and interpreted chest x-ray see no obvious focalities or pneumothorax.   PROCEDURES:  Critical Care performed: Yes, see critical care procedure note(s)  .Critical Care  Performed by: Lucillie Garfinkel, MD Authorized by: Lucillie Garfinkel, MD   Critical care provider statement:    Critical care time (minutes):  30   Critical care was necessary to treat or prevent imminent or life-threatening deterioration of the following conditions: nstemi.   Critical care was time spent personally by me on the following activities:  Development of treatment plan with patient or surrogate, discussions with consultants, evaluation of patient's response to treatment, examination of patient, ordering and review of laboratory studies, ordering and review of radiographic studies, ordering and performing treatments and interventions, pulse oximetry, re-evaluation of patient's condition and review of old  charts    [place] ED: Medications  aspirin chewable tablet 324 mg (has no administration in time range)    Consultants:  I spoke with hospitalist regarding care plan for this patient.   IMPRESSION / MDM / ASSESSMENT AND PLAN / ED COURSE  I reviewed the triage vital signs and the nursing notes.                              Differential diagnosis includes, but is not limited to, ACS, PE, CHF, viral infection, bacterial pneumonia dissection   The patient is on the cardiac monitor to evaluate for evidence of arrhythmia and/or significant heart rate changes.  MDM: Patient with exertional dyspnea and chest pressure significant CAD history with elevated troponins no STEMI, will heparinize for NSTEMI.  Give ASA.  Admit to hospitalist service.  Doubt PE given compliance with Eliquis.  Doubt dissection given minimal chest pressure at this time with normal blood pressure.  Also has AKI.   Patient's presentation is most consistent with acute presentation with potential threat to life or bodily function.       FINAL CLINICAL IMPRESSION(S) / ED DIAGNOSES   Final diagnoses:  NSTEMI (non-ST elevated myocardial infarction) ([place])  Exertional dyspnea  Chest pressure     Rx / DC Orders   ED Discharge Orders     None        Note:  This document was prepared using Dragon voice recognition software and may include unintentional dictation errors.    Lucillie Garfinkel, MD 06/12/22 [phone]

## 2022-06-12 NOTE — ED Notes (Signed)
PA resident at bedside

## 2022-06-12 NOTE — Assessment & Plan Note (Addendum)
-  PPI

## 2022-06-12 NOTE — Assessment & Plan Note (Addendum)
HR controlled. Eliquis on hold. On heparin drip for NSTEMI. Continue metoprolol Telemetry Cardiology following

## 2022-06-12 NOTE — Assessment & Plan Note (Addendum)
Mgmt per Cardiology Treated with IV heparin x 48 hrs No plan for cardiac cath given CKD stage IV Continue ASA, metoprolol, statin, Imdur, Started on Ranexa, hydralazine Imdur increased BP did not tolerate nitro patch PRN SL nitro in chest pain

## 2022-06-12 NOTE — Assessment & Plan Note (Addendum)
Continue statin

## 2022-06-12 NOTE — Assessment & Plan Note (Addendum)
Continue ASA, statin Currently with NSTEMI, see above Cardiology following

## 2022-06-12 NOTE — Assessment & Plan Note (Addendum)
Has pacemaker

## 2022-06-13 ENCOUNTER — Other Ambulatory Visit: Payer: Self-pay

## 2022-06-13 ENCOUNTER — Inpatient Hospital Stay
Admit: 2022-06-13 | Discharge: 2022-06-13 | Disposition: A | Discharge status: Home / Self Care | Payer: PPO | Attending: Internal Medicine | Admitting: Internal Medicine

## 2022-06-13 ENCOUNTER — Encounter: Payer: Self-pay | Admitting: Internal Medicine

## 2022-06-13 DIAGNOSIS — I482 Chronic atrial fibrillation, unspecified: Secondary | ICD-10-CM | POA: Diagnosis not present

## 2022-06-13 DIAGNOSIS — I214 Non-ST elevation (NSTEMI) myocardial infarction: Secondary | ICD-10-CM | POA: Diagnosis not present

## 2022-06-13 DIAGNOSIS — I25110 Atherosclerotic heart disease of native coronary artery with unstable angina pectoris: Secondary | ICD-10-CM | POA: Diagnosis not present

## 2022-06-13 DIAGNOSIS — N184 Chronic kidney disease, stage 4 (severe): Secondary | ICD-10-CM | POA: Diagnosis present

## 2022-06-13 DIAGNOSIS — I495 Sick sinus syndrome: Secondary | ICD-10-CM

## 2022-06-13 LAB — CBC
HCT: 33.7 % — ABNORMAL LOW (ref 39.0–52.0)
Hemoglobin: 11.3 g/dL — ABNORMAL LOW (ref 13.0–17.0)
MCH: 30.5 pg (ref 26.0–34.0)
MCHC: 33.5 g/dL (ref 30.0–36.0)
MCV: 91.1 fL (ref 80.0–100.0)
Platelets: 111 10*3/uL — ABNORMAL LOW (ref 150–400)
RBC: 3.7 MIL/uL — ABNORMAL LOW (ref 4.22–5.81)
RDW: 14.7 % (ref 11.5–15.5)
WBC: 5.3 10*3/uL (ref 4.0–10.5)
nRBC: 0 % (ref 0.0–0.2)

## 2022-06-13 LAB — BASIC METABOLIC PANEL
Anion gap: 12 (ref 5–15)
BUN: 51 mg/dL — ABNORMAL HIGH (ref 8–23)
CO2: 17 mmol/L — ABNORMAL LOW (ref 22–32)
Calcium: 8 mg/dL — ABNORMAL LOW (ref 8.9–10.3)
Chloride: 104 mmol/L (ref 98–111)
Creatinine, Ser: 2.29 mg/dL — ABNORMAL HIGH (ref 0.61–1.24)
GFR, Estimated: 26 mL/min — ABNORMAL LOW (ref >60)
Glucose, Bld: 89 mg/dL (ref 70–99)
Potassium: 3.6 mmol/L (ref 3.5–5.1)
Sodium: 133 mmol/L — ABNORMAL LOW (ref 135–145)

## 2022-06-13 LAB — HEPARIN LEVEL (UNFRACTIONATED)
Heparin Unfractionated: >1.1 IU/mL — ABNORMAL HIGH (ref 0.30–0.70)
Heparin Unfractionated: >1.1 IU/mL — ABNORMAL HIGH (ref 0.30–0.70)

## 2022-06-13 LAB — TROPONIN I (HIGH SENSITIVITY)
Troponin I (High Sensitivity): 1101 ng/L (ref <18)
Troponin I (High Sensitivity): 899 ng/L (ref <18)

## 2022-06-13 LAB — BRAIN NATRIURETIC PEPTIDE: B Natriuretic Peptide: 993.4 pg/mL — ABNORMAL HIGH (ref 0.0–100.0)

## 2022-06-13 LAB — APTT
aPTT: 28 seconds (ref 24–36)
aPTT: 174 seconds (ref 24–36)

## 2022-06-13 MED ORDER — ATORVASTATIN CALCIUM 20 MG PO TABS
40.0000 mg | ORAL_TABLET | Freq: Every day | ORAL | Status: DC
  Administered 2022-06-13 – 2022-06-14 (×2): 40 mg via ORAL
  Filled 2022-06-13 (×2): qty 2

## 2022-06-13 MED ORDER — RANOLAZINE ER 500 MG PO TB12
500.0000 mg | ORAL_TABLET | Freq: Two times a day (BID) | ORAL | Status: DC
  Administered 2022-06-13 – 2022-06-14 (×4): 500 mg via ORAL
  Filled 2022-06-13 (×4): qty 1

## 2022-06-13 MED ORDER — NITROGLYCERIN 0.4 MG SL SUBL
0.4000 mg | SUBLINGUAL_TABLET | SUBLINGUAL | Status: DC | PRN
  Administered 2022-06-13 – 2022-06-15 (×2): 0.4 mg via SUBLINGUAL
  Filled 2022-06-13 (×2): qty 1

## 2022-06-13 MED ORDER — HEPARIN BOLUS VIA INFUSION
2000.0000 [IU] | Freq: Once | INTRAVENOUS | Status: AC
  Administered 2022-06-13: 2000 [IU] via INTRAVENOUS
  Filled 2022-06-13: qty 2000

## 2022-06-13 MED ORDER — ASPIRIN 81 MG PO CHEW
81.0000 mg | CHEWABLE_TABLET | Freq: Every day | ORAL | Status: DC
  Administered 2022-06-13 – 2022-06-15 (×3): 81 mg via ORAL
  Filled 2022-06-13 (×3): qty 1

## 2022-06-13 MED ORDER — TAMSULOSIN HCL 0.4 MG PO CAPS
0.4000 mg | ORAL_CAPSULE | Freq: Every day | ORAL | Status: DC
  Administered 2022-06-14: 0.4 mg via ORAL
  Filled 2022-06-13 (×2): qty 1

## 2022-06-13 MED ORDER — HEPARIN (PORCINE) 25000 UT/250ML-% IV SOLN
750.0000 [IU]/h | INTRAVENOUS | Status: DC
  Administered 2022-06-13: 750 [IU]/h via INTRAVENOUS
  Filled 2022-06-13: qty 250

## 2022-06-13 MED ORDER — ISOSORBIDE MONONITRATE ER 60 MG PO TB24
120.0000 mg | ORAL_TABLET | Freq: Every day | ORAL | Status: DC
  Administered 2022-06-13 – 2022-06-15 (×3): 120 mg via ORAL
  Filled 2022-06-13 (×3): qty 2

## 2022-06-13 MED ORDER — DORZOLAMIDE HCL 2 % OP SOLN
1.0000 [drp] | Freq: Two times a day (BID) | OPHTHALMIC | Status: DC
  Administered 2022-06-13 – 2022-06-15 (×5): 1 [drp] via OPHTHALMIC
  Filled 2022-06-13: qty 10

## 2022-06-13 NOTE — ED Notes (Signed)
RN tried to call report to [phone]. Got voicemail. Left voicemail with callback number

## 2022-06-13 NOTE — Progress Notes (Addendum)
  Progress Note   Patient: Austin [name] KPT:[id number] DOB: December 26, 1928 DOA: 06/12/2022     1 DOS: the patient was seen and examined on 06/13/2022   Brief hospital course: Mr. Sonya Pucci is a 89 year old male with history of CKD stage IV, CAD, hyperlipidemia, sick sinus syndrome, atrial fibrillation on Eliquis, who presented to the ED on 06/12/2022 for evaluation of shortness of breath with exertion and chest discomfort. He was found to have hs-troponin elevation at 1474. Admitted to medicine service with cardiology consulted for further evaluation and management of NSTEMI.  Started on heparin drip.  Assessment and Plan: * NSTEMI (non-ST elevated myocardial infarction) ([place]) Mgmt per Cardiology On heparin drip No plan for cardiac cath given CKD stage IV Continue ASA, metoprolol, statin, Imdur, Ranexa PRN SL nitro in chest pain   CKD (chronic kidney disease), stage IV (HCC) Renal function near baseline. Monitor BMP. Renally dose meds & avoid nephrotoxins, IV contrast  Sick sinus syndrome (HCC) Has pacemaker   Hyperlipidemia Continue statin  Atrial fibrillation (HCC) HR controlled. Eliquis on hold. On heparin drip for NSTEMI. Continue metoprolol Telemetry Cardiology following  Coronary artery disease involving native coronary artery of native heart with unstable angina pectoris (HCC) Continue ASA, statin Currently with NSTEMI, on heparin Cardiology following  Obstructive apnea .  Acid reflux - PPI   Benign essential HTN Continue metoprolol, Imdur IV Labetalol PRN        Subjective: Pt seen in the ED, holding for a bed this AM.  He reports ongoing intermittent chest pain.  No other acute complaints.   Physical Exam: Vitals:   06/13/22 0700 06/13/22 0900 06/13/22 1140 06/13/22 1200  BP: 135/81 (!) 141/71  125/76  Pulse: 71 76  91  Resp: '20 17  19  '$ Temp:   97.8 F (36.6 C)   TempSrc:   Oral   SpO2: 93% 95%  95%  Weight:      Height:       General  exam: awake, alert, no acute distress HEENT: atraumatic, clear conjunctiva, anicteric sclera, moist mucus membranes, hearing grossly normal  Respiratory system: CTAB, no wheezes, rales or rhonchi, normal respiratory effort. Cardiovascular system: normal S1/S2, RRR, no pedal edema.   Gastrointestinal system: soft, NT, ND, no HSM felt, +bowel sounds. Central nervous system: A&O x4. no gross focal neurologic deficits, normal speech Extremities: moves all, no edema, normal tone Skin: dry, intact, normal temperature Psychiatry: normal mood, congruent affect, judgement and insight appear normal   Data Reviewed:  Notable labs --- BNP 993.4, troponin 1101 >> 899, Hbg 11.3 stable   Family Communication: None present, will attempt to call  Disposition: Status is: Inpatient Remains inpatient appropriate because: remains on IV heparin x 48 hours. Discharge pending clearance by cardiology.   Planned Discharge Destination: Home    Time spent: 40 minutes  Author: Ezekiel Slocumb, DO 06/13/2022 2:55 PM  For on call review [email or web address].

## 2022-06-13 NOTE — ED Notes (Signed)
Patient C/O left sternal stabbing chest pain that began while he was standing to urinate. Performed repeat EKG and administered nitro paste. Sharion Settler, NP notified.

## 2022-06-13 NOTE — ED Notes (Signed)
Pt requesting flomax, provider, Randol Kern, NP, sent secure chat

## 2022-06-13 NOTE — Consult Note (Signed)
[place] NOTE       Patient ID: Austin [name] MRN: [id number] DOB/AGE: 12-27-1928 89 y.o.  Admit date: 06/12/2022 Referring Physician Dr. Rupert Stacks Primary Physician Dr. Syliva Overman Primary Cardiologist Dr. Nehemiah Massed / Leroy Libman, PA-C Reason for Consultation NSTEMI  HPI: Austin [name] is a 89yoM with a PMH of severe 3vCAD s/p CABG x 3 (hx NSTEMI 05/2021 medically managed), SSS s/p Micra Leadless PPM (07/2021), paroxysmal AF on eliquis, HFpEF (50-55% 11/2020), mild aortic stenosis, CKD 4, HLD who presented to [facility] ED 06/12/22 with dyspnea on exertion for several months that worsened over the past week + persistent chest pressure since the morning of admission. Cardiology is consulted for further assistance.   Patient states over the past several days he has been more short of breath with very poor appetite.  Yesterday he woke up with a burning and pressure like sensation in his chest that was different than his prior heart attacks.  He says this feels different than when he has had heart failure exacerbations and does not have any current orthopnea or peripheral edema.  Prior to having his bypass surgery he said his "chest was hurting" is different in character than what he is currently experiencing.  He took 2 sublingual nitroglycerin prior to presenting to the emergency department with initial relief of his chest pain, but this had recurred while in the emergency department and getting better with Nitropaste and repeat dosing of SL nitroglycerin.  At my time of evaluation he is laying flat in bed on room air while his echocardiogram is being performed.  He had some more burning/pressure in his chest around 6:00 this morning that is eased off after SL nitroglycerin.  He reports compliance to his medications, denies shortness of breath currently, no orthopnea PND, peripheral edema.  He occasionally has some palpitations and heart racing but noted while at home his  heart rate was around 118 and his blood pressure was 79/40.  He denies sick contacts, although he has somewhat of a runny nose which he usually gets in the winter when it is cold outside.  Recent vitals are notable for a blood pressure of 135/81, comfortable on room air, on telemetry in sinus rhythm to sinus tach with rate in the 90s to low 100s.  Labs notable for a potassium of 3.6, BUN/creatinine improving overnight at 51/2.29 and current GFR 26.  High-sensitivity troponin elevated on first check at 1474, and repeat this morning with downtrend at 1101.  BNP pending.  H&H around baseline at 11.4/35.7, and thrombocytopenia with platelets 121 K.  No leukocytosis with WBCs 4.8.  Chest x-ray with trivial pleural effusions, otherwise without acute cardiac process.  Review of systems complete and found to be negative unless listed above     Past Medical History:  Diagnosis Date   Absolute anemia 12/08/2014   Allergy    Anginal pain ([place])    much less angina d/t lack of activity   Arthritis    all over body   Atrial fibrillation ([place])    Bradycardia 05/03/2015   Calculus of kidney 12/08/2014   Coronary artery disease    GERD (gastroesophageal reflux disease)    Glaucoma    History of kidney stones    HLD (hyperlipidemia)    Hyperglycemia 08/27/2015   Hypertension    Macular degeneration    Myocardial infarction [facility]) 2011   Peripheral vascular disease ([place])    VHD (valvular heart disease)     Past  Surgical History:  Procedure Laterality Date   ANTERIOR CERVICAL DECOMP/DISCECTOMY FUSION N/A 12/30/2017   Procedure: ANTERIOR CERVICAL DECOMPRESSION/DISCECTOMY FUSION 3 LEVELS- C3-6;  Surgeon: Meade Maw, MD;  Location: [facility] ORS;  Service: Neurosurgery;  Laterality: N/A;   CARDIAC CATHETERIZATION  2007   CARDIAC CATHETERIZATION N/A 04/17/2015   Procedure: Left Heart Cath and Coronary Angiography;  Surgeon: Corey Skains, MD;  Location: [place] CV LAB;  Service:  Cardiovascular;  Laterality: N/A;   CARPAL TUNNEL RELEASE Bilateral    right 06/09/08 left 05/03/09   CATARACT EXTRACTION W/ INTRAOCULAR LENS  IMPLANT, BILATERAL     COLONOSCOPY N/A 10/02/2014   Procedure: COLONOSCOPY;  Surgeon: Manya Silvas, MD;  Location: [facility] ENDOSCOPY;  Service: Endoscopy;  Laterality: N/A;   CORONARY ARTERY BYPASS GRAFT  2007   ESOPHAGOGASTRODUODENOSCOPY     EYE SURGERY     HEMORROIDECTOMY     JOINT REPLACEMENT Right 03/2017   partial knee replacement   KNEE ARTHROSCOPY Right 08/05/2016   Procedure: ARTHROSCOPY KNEE;  Surgeon: Thornton Park, MD;  Location: [facility] ORS;  Service: Orthopedics;  Laterality: Right;   KNEE ARTHROSCOPY WITH LATERAL RELEASE Right 08/05/2016   Procedure: KNEE ARTHROSCOPY WITH LATERAL RELEASE;  Surgeon: Thornton Park, MD;  Location: [facility] ORS;  Service: Orthopedics;  Laterality: Right;   KNEE ARTHROSCOPY WITH MEDIAL MENISECTOMY Right 08/05/2016   Procedure: KNEE ARTHROSCOPY WITH MEDIAL MENISECTOMY;  Surgeon: Thornton Park, MD;  Location: [facility] ORS;  Service: Orthopedics;  Laterality: Right;  Partial   LEFT HEART CATH AND CORONARY ANGIOGRAPHY Left 05/16/2021   Procedure: LEFT HEART CATH AND CORONARY ANGIOGRAPHY;  Surgeon: Corey Skains, MD;  Location: [place] [facility];  Service: Cardiovascular;  Laterality: Left;   LUMBAR EPIDURAL INJECTION  2011   LUMBAR LAMINECTOMY/DECOMPRESSION MICRODISCECTOMY N/A 03/10/2018   Procedure: LUMBAR LAMINECTOMY/DECOMPRESSION MICRODISCECTOMY 3 LEVELS-L2-5;  Surgeon: Meade Maw, MD;  Location: [facility] ORS;  Service: Neurosurgery;  Laterality: N/A;   MOUTH SURGERY     tooth implant   PACEMAKER LEADLESS INSERTION N/A 08/27/2021   Procedure: PACEMAKER LEADLESS INSERTION;  Surgeon: Isaias Cowman, MD;  Location: [place] [facility];  Service: Cardiovascular;  Laterality: N/A;   SHOULDER SURGERY Right 2011   rotator cuff repair   SYNOVECTOMY Right 08/05/2016   Procedure: SYNOVECTOMY;  Surgeon: Thornton Park, MD;  Location: [facility] ORS;  Service: Orthopedics;  Laterality: Right;  arthroscopic extensive synovectomy   TONSILLECTOMY      (Not in a hospital admission)  Social History   Socioeconomic History   Marital status: Married    Spouse name: Austin [name]   Number of children: 1   Years of education: Not on file   Highest education level: Some college, no degree  Occupational History   Occupation: Engineer, water: RETIRED  Tobacco Use   Smoking status: Former    Years: 15.00    Types: Cigarettes    Quit date: 1980    Years since quitting: 44.0   Smokeless tobacco: Never  Vaping Use   Vaping Use: Never used  Substance and Sexual Activity   Alcohol use: Yes    Alcohol/week: 3.0 standard drinks of alcohol    Types: 3 Glasses of wine per week   Drug use: No   Sexual activity: Not Currently  Other Topics Concern   Not on file  Social History Narrative   Not on file   Social Determinants of Health   Financial Resource Strain: Low Risk  (06/05/2022)   Overall  Financial Resource Strain (CARDIA)    Difficulty of Paying Living Expenses: Not hard at all  Food Insecurity: No Food Insecurity (06/13/2022)   Hunger Vital Sign    Worried About Running Out of Food in the Last Year: Never true    Ran Out of Food in the Last Year: Never true  Transportation Needs: No Transportation Needs (06/13/2022)   PRAPARE - Hydrologist (Medical): No    Lack of Transportation (Non-Medical): No  Physical Activity: Sufficiently Active (06/05/2022)   Exercise Vital Sign    Days of Exercise per Week: 4 days    Minutes of Exercise per Session: 40 min  Stress: No Stress Concern Present (06/05/2022)   [place]    Feeling of Stress : Not at all  Social Connections: Moderately Integrated (06/05/2022)   Social Connection and Isolation Panel [NHANES]    Frequency of Communication with Friends  and Family: More than three times a week    Frequency of Social Gatherings with Friends and Family: Twice a week    Attends Religious Services: More than 4 times per year    Active Member of [employer] or Organizations: No    Attends Archivist Meetings: Never    Marital Status: Married  Human resources officer Violence: Not At Risk (06/13/2022)   Humiliation, Afraid, Rape, and Kick questionnaire    Fear of Current or Ex-Partner: No    Emotionally Abused: No    Physically Abused: No    Sexually Abused: No    Family History  Problem Relation Age of Onset   Heart disease Mother    Heart disease Father    Heart disease Brother    Heart disease Brother    Heart disease Brother    Heart disease Brother    Heart disease Brother      No intake or output data in the 24 hours ending 06/13/22 0820  Vitals:   06/13/22 0647 06/13/22 0648 06/13/22 0651 06/13/22 0700  BP:   (!) 141/69 135/81  Pulse: 68 66 65 71  Resp: 17 12 (!) 21 20  Temp:      TempSrc:      SpO2: 94% 96% 94% 93%  Weight:      Height:        PHYSICAL EXAM General: Pleasant ill-appearing Caucasian male, well nourished, in no acute distress.  Lying flat in bed with echo tech at bedside HEENT:  Normocephalic and atraumatic. Neck:  No JVD.  Lungs: Normal respiratory effort on room air. Clear bilaterally to auscultation. No wheezes, crackles, rhonchi.  Heart: HRRR . Normal S1 and S2 without gallops or murmurs.  Abdomen: Non-distended appearing.  Msk: Normal strength and tone for age. Extremities: Warm and well perfused. No clubbing, cyanosis.  No peripheral edema.  Neuro: Alert and oriented X 3. Psych:  Answers questions appropriately.   Labs: Basic Metabolic Panel: Recent Labs    06/12/22 1514  NA 130*  K 3.8  CL 98  CO2 20*  GLUCOSE 141*  BUN 56*  CREATININE 2.56*  CALCIUM 8.4*   Liver Function Tests: No results for input(s): "AST", "ALT", "ALKPHOS", "BILITOT", "PROT", "ALBUMIN" in the last 72 hours. No  results for input(s): "LIPASE", "AMYLASE" in the last 72 hours. CBC: Recent Labs    06/12/22 1514  WBC 4.8  HGB 11.4*  HCT 35.7*  MCV 93.9  PLT 121*   Cardiac Enzymes: Recent Labs    06/12/22  [place]   BNP: No results for input(s): "BNP" in the last 72 hours. D-Dimer: No results for input(s): "DDIMER" in the last 72 hours. Hemoglobin A1C: No results for input(s): "HGBA1C" in the last 72 hours. Fasting Lipid Panel: No results for input(s): "CHOL", "HDL", "LDLCALC", "TRIG", "CHOLHDL", "LDLDIRECT" in the last 72 hours. Thyroid Function Tests: No results for input(s): "TSH", "T4TOTAL", "T3FREE", "THYROIDAB" in the last 72 hours.  Invalid input(s): "FREET3" Anemia Panel: No results for input(s): "VITAMINB12", "FOLATE", "FERRITIN", "TIBC", "IRON", "RETICCTPCT" in the last 72 hours.   Radiology: DG Chest 2 View  Result Date: 06/12/2022 CLINICAL DATA:  Chest pain. EXAM: CHEST - 2 VIEW COMPARISON:  September 09, 2021. FINDINGS: Mild cardiomegaly is noted. Status post coronary bypass graft. Minimal bilateral pleural effusions are noted. No consolidative process is noted. Bony thorax is unremarkable. IMPRESSION: Minimal bilateral pleural effusions. Status post coronary bypass graft. Electronically Signed   By: Marijo Conception M.D.   On: 06/12/2022 16:22    05/16/2021 LHC - Dr. Nehemiah Massed   LM lesion is 95% stenosed.   Ramus lesion is 75% stenosed.   Prox Cx lesion is 90% stenosed.   Prox RCA lesion is 100% stenosed.   1st Diag lesion is 75% stenosed.   1st Mrg lesion is 100% stenosed.   2nd Mrg lesion is 80% stenosed.   Mid LAD lesion is 100% stenosed.   Prox LAD lesion is 95% stenosed.   Mid LM to Dist LM lesion is 99% stenosed.   Ost Cx lesion is 100% stenosed.   Mid RCA lesion is 100% stenosed.   Dist RCA lesion is 95% stenosed.   Origin lesion is 100% stenosed.   The graft exhibits no disease.   89 year old male with known coronary disease status post coronary  bypass graft with significant progression of anginal symptoms over the last 4 to 5 weeks on maximal medication management   Left end-diastolic pressures are normal echocardiogram has shown normal LV function   Severe three-vessel coronary artery disease with coronary bypass grafting with patent graft to PDA and LIMA to the LAD Occluded graft to obtuse marginal 1 Significant stenoses of left main and LAD feeding first and second large diagonals Significant atherosclerosis distal right coronary artery feeding posterior lateral branches with collateral flow   Plan Maximize medical management due to significant coronary artery disease not amenable to PCI and stent placement  ECHO 12/04/2020 ECHOCARDIOGRAPHIC MEASUREMENTS  2D DIMENSIONS  AORTA                  Values   Normal Range   MAIN PA         Values    Normal Range                Annulus: 1.8 cm       [2.3-2.9]         PA Main: nm*       [1.5-2.1]              Aorta Sin: 2.9 cm       [3.1-3.7]    RIGHT VENTRICLE            ST Junction: nm*          [2.6-3.2]         RV Base: nm*       [<4.2]             Asc.Aorta: nm*          [  2.6-3.4]          RV Mid: 3.1 cm    [<3.5]  LEFT VENTRICLE                                      RV Length: nm*       [<8.6]                 LVIDd: 5.0 cm       [4.2-5.9]    INFERIOR VENA CAVA                  LVIDs: 3.7 cm                        Max. IVC: nm*       [<=2.1]                    FS: 26.8 %       [>25]            Min. IVC: nm*                    SWT: 1.5 cm       [0.6-1.0]    ------------------                    PWT: 1.0 cm       [0.6-1.0]    nm* - not measured  LEFT ATRIUM                LA Diam: 4.7 cm       [3.0-4.0]            LA A4C Area: nm*          [<20]             LA Volume: nm*          [18-58]  _________________________________________________________________________________________  ECHOCARDIOGRAPHIC DESCRIPTIONS  AORTIC ROOT                   Size: Normal             Dissection:  INDETERM FOR DISSECTION  AORTIC VALVE               Leaflets: Tricuspid                   Morphology: MILDLY THICKENED               Mobility: Fully mobile  LEFT VENTRICLE                   Size: Normal                        Anterior: Normal            Contraction: Normal                         Lateral: Normal             Closest EF: 50% (Estimated)                 Septal: Normal              LV Masses: No Masses  Apical: Normal                    LVH: MODERATE LVH                  Inferior: Normal                                                      Posterior: Normal           Dias.FxClass: N/A  MITRAL VALVE               Leaflets: Normal                        Mobility: Fully mobile             Morphology: THICKENED LEAFLET(S)  LEFT ATRIUM                   Size: MODERATELY ENLARGED          LA Masses: No masses              IA Septum: Normal IAS  MAIN PA                   Size: Normal  PULMONIC VALVE             Morphology: Normal                        Mobility: Fully mobile  RIGHT VENTRICLE              RV Masses: No Masses                         Size: Normal              Free Wall: Normal                     Contraction: Normal  TRICUSPID VALVE               Leaflets: Normal                        Mobility: Fully mobile             Morphology: Normal  RIGHT ATRIUM                   Size: Normal                        RA Other: None                RA Mass: No masses  PERICARDIUM                 Fluid: No effusion  INFERIOR VENACAVA                   Size: Normal Normal respiratory collapse  _________________________________________________________________________________________   DOPPLER ECHO and OTHER SPECIAL PROCEDURES                 Aortic: MILD AR                    MILD AS  249.3 cm/sec peak vel      24.9 mmHg peak grad                         12.3 mmHg mean grad                 Mitral: MILD MR                    No  MS                         MV Inflow E Vel = 171.0 cm/sec      MV Annulus E'Vel = 5.3 cm/sec                         E/E'Ratio = 32.3              Tricuspid: MILD TR                    No TS                         288.7 cm/sec peak TR vel   36.3 mmHg peak RV pressure              Pulmonary: MILD PR                    No PS                         105.9 cm/sec peak vel      4.5 mmHg peak grad  _________________________________________________________________________________________  INTERPRETATION  NORMAL LEFT VENTRICULAR SYSTOLIC FUNCTION   WITH MODERATE LVH  NORMAL RIGHT VENTRICULAR SYSTOLIC FUNCTION  MILD VALVULAR STENOSIS (See above)  AVAA(VTI)=.89cm^2  MILD AR, MR, TR, PR  MILD AS  EF 50-55%   TELEMETRY reviewed by me (LT) 06/13/2022 : periods of atrial flutter and ventricular pacing, rate controlled in the 90s-low 100s.  EKG reviewed by me: Initially atrial flutter with variable conduction and inferior ST depressions, persistent inferior ST depressions on repeat 1/12  Data reviewed by me (LT) 06/13/2022: ED note, admission H&P CBC BMP ordered troponins chest x-ray EKGs last LHC report, last echo report  Principal Problem:   NSTEMI (non-ST elevated myocardial infarction) ([place]) Active Problems:   Benign essential HTN   Acid reflux   Obstructive apnea   Coronary artery disease involving native coronary artery of native heart with unstable angina pectoris (HCC)   Atrial fibrillation (HCC)   Hyperlipidemia   Sick sinus syndrome ([place])    ASSESSMENT AND PLAN:  Brasen "Mikki Santee" Kampe is a 20yoM with a PMH of severe 3vCAD s/p CABG x 3 (hx NSTEMI 05/2021 medically managed), SSS s/p Micra Leadless PPM (07/2021), paroxysmal AF on eliquis, HFpEF (50-55% 11/2020), mild aortic stenosis, CKD 4, HLD who presented to [facility] ED 06/12/22 with dyspnea on exertion for several months that worsened over the past week + persistent chest pressure since the morning of admission. Cardiology is consulted for  further assistance.   # NSTEMI  # severe 3vCAD s/p CABG x 3  Presents with several days of shortness of breath and severe chest pressure/burning the morning of presentation, relieved with sublingual nitroglycerin at home, different in character to his prior heart attacks.  Initial troponin is elevated at 1400 and repeat >12 hours  later 1101, EKG initially shows atrial flutter with inferior ST depressions, and repeat AV paced rhythm and persistent ST depressions without reciprocal ST elevations concerning for NSTEMI.  He is hemodynamically completely stable and chest pain-free at my time of evaluation this morning after as needed SL nitroglycerin. -Check a third troponin, if continuing to downtrend then stop. -S/p 325 mg aspirin, continue 81 mg aspirin daily -Continue heparin infusion for 48 hours, ending 1/13 at 1715 -Increase atorvastatin from 20 mg to 40 mg daily -As needed SL nitroglycerin for chest pain -Increase Imdur to 120 mg once a day -Add Ranexa 500 mg twice daily -Continue metoprolol tartrate 25 mg twice a day -Echo complete performed this morning, pending read -Discussed in detail the risk, benefits, and alternatives to invasive diagnostics with a LHC versus medical management of his NSTEMI.  He has known severe underlying three-vessel CAD with last LHC in 2022 vessels and grafts were unamenable to PCI continue medical management was recommended.  The patient also has CKD 4 and does not wish to ever pursue dialysis should his renal function deteriorate, which is certainly a risk with IV contrast.  The patient prefers to continue conservative medical management + increasing anti anginals which is certainly reasonable.     # CKD 4 Recent baseline creatinine 2.36-2.40 over the past 3 months, on admission BUN/creatinine 56/2.56 and GFR 23.   -Monitor BMP daily  # mild AS # HFpEF 50-55% 11/2020 Clinically euvolemic, lying flat on room air.  CXR with trivial pleural effusions.  -Continue  GDMT with metoprolol.  Hold home torsemide 20 mg daily for today.  # SSS s/p Micra leadless PPM # Paroxysmal atrial fibrillation Intermittent periods of atrial flutter, rate controlled in the 90s-low 100s. Occasionally V pacing. -Hold Eliquis 2.5 mg twice daily (reduced dosing with Cr >1.5, age >87) while on heparin infusion, restart this likely Sunday a.m. 1/14 following 48 hours of heparin.  This patient's plan of care was discussed and created with Dr. Clayborn Bigness and he is in agreement.  Signed: Tristan Schroeder , PA-C 06/13/2022, 8:20 AM [facility]

## 2022-06-13 NOTE — Progress Notes (Signed)
*  PRELIMINARY RESULTS* Echocardiogram 2D Echocardiogram has been performed.  Sherrie Sport 06/13/2022, 8:54 AM

## 2022-06-13 NOTE — Assessment & Plan Note (Signed)
Renal function near baseline. Monitor BMP. Renally dose meds & avoid nephrotoxins, IV contrast

## 2022-06-13 NOTE — Progress Notes (Signed)
CROSS COVER NOTE  NAME: Austin [name] MRN: [id number] DOB : [birth year]    HPI/Events of Note   Patient requesting home med flo max  Assessment and  Interventions   Assessment: Patient taking nightly for LUTS - frequency that keeps him up at night Plan: Flomax reordered from home med list       Kathlene Cote NP [facility]

## 2022-06-13 NOTE — Progress Notes (Signed)
ANTICOAGULATION CONSULT NOTE  Pharmacy Consult for heparin infusion  Indication: chest pain/ACS  Allergies  Allergen Reactions   Penicillins Itching, Swelling and Rash    Patient Measurements: Height: '5\' 6"'$  (167.6 cm) Weight: 68 kg (150 lb) IBW/kg (Calculated) : 63.8  Vital Signs: Temp: 97.8 F (36.6 C) (01/12 1140) Temp Source: Oral (01/12 1140) BP: 125/76 (01/12 1200) Pulse Rate: 91 (01/12 1200)  Labs: Recent Labs    06/12/22 1514 06/13/22 0315 06/13/22 0807 06/13/22 1034 06/13/22 1348  HGB 11.4*  --   --   --  11.3*  HCT 35.7*  --   --   --  33.7*  PLT 121*  --   --   --  111*  APTT 52* 28  --   --  174*  LABPROT 19.2*  --   --   --   --   INR 1.6*  --   --   --   --   HEPARINUNFRC  --  >1.10*  --   --  >1.10*  CREATININE 2.56*  --  2.29*  --   --   TROPONINIHS 1,474*  --  1,101* 899*  --      Estimated Creatinine Clearance: 19 mL/min (A) (by C-G formula based on SCr of 2.29 mg/dL (H)).   Medical History: Past Medical History:  Diagnosis Date   Absolute anemia 12/08/2014   Allergy    Anginal pain (HCC)    much less angina d/t lack of activity   Arthritis    all over body   Atrial fibrillation (HCC)    Bradycardia 05/03/2015   Calculus of kidney 12/08/2014   Coronary artery disease    GERD (gastroesophageal reflux disease)    Glaucoma    History of kidney stones    HLD (hyperlipidemia)    Hyperglycemia 08/27/2015   Hypertension    Macular degeneration    Myocardial infarction [facility]) 2011   Peripheral vascular disease (HCC)    VHD (valvular heart disease)     Medications:  Apixaban 2.'5mg'$  BID PTA  Assessment: 89 yo male admitted with shortness of breath and chest pressure. Pharmacy has been consulted for heparin infusion dosing and monitoring for NSTEMI. Patient has PMH of A. Fib, taking apixaban 2.'5mg'$  prior to admission. Last dose reported at 1100 this morning.   HS Troponin: 1474   Goal of Therapy:  Heparin level 0.3-0.7 units/ml aPTT  66-102 seconds Monitor platelets by anticoagulation protocol: Yes   1/12 0315 aPTT 28 subtherapeutic, HL > 1.1  Plan: aPTT supra-therapeutic at 174 Hold heparin gtt x 1 hour. Then, decrease heparin infusion to 750 units/hr Will need to monitor aPTT levels until aPTT and anti-Xa (heparin levels) correlate.  Check aPTT level in 8 hours after rate change and anti-Xa level in daily while on heparin Continue to monitor H&H and platelets    Glean Salvo, PharmD, BCPS Clinical Pharmacist  06/13/2022 3:52 PM

## 2022-06-13 NOTE — Progress Notes (Signed)
ANTICOAGULATION CONSULT NOTE  Pharmacy Consult for heparin infusion  Indication: chest pain/ACS  Allergies  Allergen Reactions   Penicillins Itching, Swelling and Rash    Patient Measurements: Height: '5\' 6"'$  (167.6 cm) Weight: 68 kg (150 lb) IBW/kg (Calculated) : 63.8  Vital Signs: Temp: 97.9 F (36.6 C) (01/12 0311) Temp Source: Oral (01/12 0311) BP: 109/55 (01/12 0311) Pulse Rate: 62 (01/12 0311)  Labs: Recent Labs    06/12/22 1514 06/13/22 0315  HGB 11.4*  --   HCT 35.7*  --   PLT 121*  --   APTT 52* 28  LABPROT 19.2*  --   INR 1.6*  --   HEPARINUNFRC  --  >1.10*  CREATININE 2.56*  --   TROPONINIHS 1,474*  --      Estimated Creatinine Clearance: 17 mL/min (A) (by C-G formula based on SCr of 2.56 mg/dL (H)).   Medical History: Past Medical History:  Diagnosis Date   Absolute anemia 12/08/2014   Allergy    Anginal pain (HCC)    much less angina d/t lack of activity   Arthritis    all over body   Atrial fibrillation (HCC)    Bradycardia 05/03/2015   Calculus of kidney 12/08/2014   Coronary artery disease    GERD (gastroesophageal reflux disease)    Glaucoma    History of kidney stones    HLD (hyperlipidemia)    Hyperglycemia 08/27/2015   Hypertension    Macular degeneration    Myocardial infarction [facility]) 2011   Peripheral vascular disease (HCC)    VHD (valvular heart disease)     Medications:  Apixaban 2.'5mg'$  BID PTA  Assessment: 89 yo male admitted with shortness of breath and chest pressure. Pharmacy has been consulted for heparin infusion dosing and monitoring for NSTEMI. Patient has PMH of A. Fib, taking apixaban 2.'5mg'$  prior to admission. Last dose reported at 1100 this morning.   HS Troponin: 1474   Goal of Therapy:  Heparin level 0.3-0.7 units/ml aPTT 66-102 seconds Monitor platelets by anticoagulation protocol: Yes   1/12 0315 aPTT 28 subtherapeutic, HL > 1.1  Plan:  Bolus 2000 units x 1 Increase heparin infusion to 1000  units/hr Will need to monitor aPTT levels until aPTT and anti-Xa (heparin levels) correlate.  Check aPTT level in 8 hours after rate change and anti-Xa level in daily while on heparin Continue to monitor H&H and platelets  Renda Rolls, PharmD, [facility] 06/13/2022 4:01 AM

## 2022-06-13 NOTE — ED Notes (Signed)
Pt co of 9/10 chest pain/tightness. Pt states he feels hot. EKG done. Vitals repeated. Provider notified.

## 2022-06-14 DIAGNOSIS — I214 Non-ST elevation (NSTEMI) myocardial infarction: Secondary | ICD-10-CM | POA: Diagnosis not present

## 2022-06-14 LAB — ECHOCARDIOGRAM COMPLETE
AR max vel: 1.21 cm2
AV Area VTI: 1.21 cm2
AV Area mean vel: 1.14 cm2
AV Mean grad: 8 mmHg
AV Peak grad: 14.2 mmHg
Ao pk vel: 1.89 m/s
Area-P 1/2: 8.43 cm2
Calc EF: 33.5 %
Height: 66 in
S' Lateral: 4.8 cm
Single Plane A2C EF: 30.7 %
Single Plane A4C EF: 35.1 %
Weight: 2400 oz

## 2022-06-14 LAB — CBC
HCT: 32.2 % — ABNORMAL LOW (ref 39.0–52.0)
Hemoglobin: 10.7 g/dL — ABNORMAL LOW (ref 13.0–17.0)
MCH: 30.5 pg (ref 26.0–34.0)
MCHC: 33.2 g/dL (ref 30.0–36.0)
MCV: 91.7 fL (ref 80.0–100.0)
Platelets: 101 10*3/uL — ABNORMAL LOW (ref 150–400)
RBC: 3.51 MIL/uL — ABNORMAL LOW (ref 4.22–5.81)
RDW: 14.6 % (ref 11.5–15.5)
WBC: 4.8 10*3/uL (ref 4.0–10.5)
nRBC: 0 % (ref 0.0–0.2)

## 2022-06-14 LAB — BASIC METABOLIC PANEL
Anion gap: 10 (ref 5–15)
BUN: 50 mg/dL — ABNORMAL HIGH (ref 8–23)
CO2: 18 mmol/L — ABNORMAL LOW (ref 22–32)
Calcium: 8 mg/dL — ABNORMAL LOW (ref 8.9–10.3)
Chloride: 103 mmol/L (ref 98–111)
Creatinine, Ser: 2.17 mg/dL — ABNORMAL HIGH (ref 0.61–1.24)
GFR, Estimated: 28 mL/min — ABNORMAL LOW (ref >60)
Glucose, Bld: 97 mg/dL (ref 70–99)
Potassium: 3.7 mmol/L (ref 3.5–5.1)
Sodium: 131 mmol/L — ABNORMAL LOW (ref 135–145)

## 2022-06-14 LAB — MAGNESIUM: Magnesium: 2.1 mg/dL (ref 1.7–2.4)

## 2022-06-14 LAB — APTT
aPTT: 83 seconds — ABNORMAL HIGH (ref 24–36)
aPTT: 73 seconds — ABNORMAL HIGH (ref 24–36)

## 2022-06-14 LAB — HEPARIN LEVEL (UNFRACTIONATED): Heparin Unfractionated: 0.89 IU/mL — ABNORMAL HIGH (ref 0.30–0.70)

## 2022-06-14 MED ORDER — APIXABAN 2.5 MG PO TABS
2.5000 mg | ORAL_TABLET | Freq: Two times a day (BID) | ORAL | Status: DC
  Administered 2022-06-14 – 2022-06-15 (×3): 2.5 mg via ORAL
  Filled 2022-06-14 (×3): qty 1

## 2022-06-14 MED ORDER — LATANOPROST 0.005 % OP SOLN
1.0000 [drp] | Freq: Every day | OPHTHALMIC | Status: DC
  Administered 2022-06-14: 1 [drp] via OPHTHALMIC
  Filled 2022-06-14: qty 2.5

## 2022-06-14 MED ORDER — NITROGLYCERIN 0.6 MG/HR TD PT24
0.6000 mg | MEDICATED_PATCH | Freq: Every day | TRANSDERMAL | Status: DC
  Filled 2022-06-14 (×2): qty 1

## 2022-06-14 MED ORDER — ARTIFICIAL TEARS OPHTHALMIC OINT: 1.0000 | TOPICAL_OINTMENT | Freq: Every day | OPHTHALMIC | Status: DC | PRN

## 2022-06-14 NOTE — Progress Notes (Signed)
ANTICOAGULATION CONSULT NOTE  Pharmacy Consult for heparin infusion  Indication: chest pain/ACS  Allergies  Allergen Reactions   Penicillins Itching, Swelling and Rash    Patient Measurements: Height: '5\' 6"'$  (167.6 cm) Weight: 68.7 kg (151 lb 7.3 oz) IBW/kg (Calculated) : 63.8  Vital Signs: Temp: 98.1 F (36.7 C) (01/13 0743) Temp Source: Oral (01/13 0431) BP: 127/78 (01/13 0743) Pulse Rate: 104 (01/13 0743)  Labs: Recent Labs    06/12/22 1514 06/13/22 0315 06/13/22 0807 06/13/22 1034 06/13/22 1348 06/14/22 0027 06/14/22 0428 06/14/22 0836  HGB 11.4*  --   --   --  11.3*  --  10.7*  --   HCT 35.7*  --   --   --  33.7*  --  32.2*  --   PLT 121*  --   --   --  111*  --  101*  --   APTT 52* 28  --   --  174* 83*  --  73*  LABPROT 19.2*  --   --   --   --   --   --   --   INR 1.6*  --   --   --   --   --   --   --   HEPARINUNFRC  --  >1.10*  --   --  >1.10*  --   --  0.89*  CREATININE 2.56*  --  2.29*  --   --   --  2.17*  --   TROPONINIHS 1,474*  --  1,101* 899*  --   --   --   --      Estimated Creatinine Clearance: 20 mL/min (A) (by C-G formula based on SCr of 2.17 mg/dL (H)).   Medical History: Past Medical History:  Diagnosis Date   Absolute anemia 12/08/2014   Allergy    Anginal pain (HCC)    much less angina d/t lack of activity   Arthritis    all over body   Atrial fibrillation (HCC)    Bradycardia 05/03/2015   Calculus of kidney 12/08/2014   Coronary artery disease    GERD (gastroesophageal reflux disease)    Glaucoma    History of kidney stones    HLD (hyperlipidemia)    Hyperglycemia 08/27/2015   Hypertension    Macular degeneration    Myocardial infarction [facility]) 2011   Peripheral vascular disease (HCC)    VHD (valvular heart disease)     Medications:  Apixaban 2.'5mg'$  BID PTA  Assessment: 89 yo male admitted with shortness of breath and chest pressure. Pharmacy has been consulted for heparin infusion dosing and monitoring for NSTEMI.  Patient has PMH of A. Fib, taking apixaban 2.'5mg'$  prior to admission. Last dose reported at 1100 this morning.   HS Troponin: 1474   Goal of Therapy:  Heparin level 0.3-0.7 units/ml aPTT 66-102 seconds Monitor platelets by anticoagulation protocol: Yes   1/12 0315 aPTT 28 subtherapeutic, HL > 1.1 1/13 0027 aPTT 83, therapeutic x 1 1/13 0836 aPTT 73, HL 0.89  Plan: aPTT therapeutic x 2 Continue heparin infusion at 750 units/hr Will need to monitor aPTT levels until aPTT and anti-Xa (heparin levels) correlate.  Check aPTT level and anti-Xa level tomorrow with AM labs Continue to monitor H&H and platelets  Pearla Dubonnet, PharmD Clinical Pharmacist 06/14/2022 10:21 AM

## 2022-06-14 NOTE — Progress Notes (Signed)
Mobility Specialist - Progress Note   06/14/22 1500  Mobility  Activity Ambulated independently in hallway;Stood at bedside;Dangled on edge of bed  Level of Assistance Independent  Assistive Device [employer] [place] Ambulated (ft) 90 ft  Activity Response Tolerated well  Mobility Referral Yes  $Mobility charge 1 Mobility   Pt supine asleep in bed on RA upon arrival. Pt awakens to voice easily. Pt completes bed mobility, STS, and ambulates in hallway indep. Pt returns to bed with needs in reach. \  Gretchen Short  Mobility Specialist  06/14/22 3:37 PM

## 2022-06-14 NOTE — Progress Notes (Signed)
  Progress Note   Patient: Austin [name] PYP:[id number] DOB: 1928-09-19 DOA: 06/12/2022     3 DOS: the patient was seen and examined on 06/15/2022   Brief hospital course: [name] is a 89 year old male with history of CKD stage IV, CAD, hyperlipidemia, sick sinus syndrome, atrial fibrillation on Eliquis, who presented to the ED on 06/12/2022 for evaluation of shortness of breath with exertion and chest discomfort. He was found to have hs-troponin elevation at 1474. Admitted to medicine service with cardiology consulted for further evaluation and management of NSTEMI.  Started on heparin drip.  Assessment and Plan: * NSTEMI (non-ST elevated myocardial infarction) ([place]) Mgmt per Cardiology On heparin drip No plan for cardiac cath given CKD stage IV Continue ASA, metoprolol, statin, Imdur, Ranexa PRN SL nitro in chest pain   CKD (chronic kidney disease), stage IV (HCC) Renal function near baseline. Monitor BMP. Renally dose meds & avoid nephrotoxins, IV contrast  Sick sinus syndrome (HCC) Has pacemaker   Hyperlipidemia Continue statin  Atrial fibrillation (HCC) HR controlled. Eliquis on hold. On heparin drip for NSTEMI. Continue metoprolol Telemetry Cardiology following  Coronary artery disease involving native coronary artery of native heart with unstable angina pectoris (HCC) Continue ASA, statin Currently with NSTEMI, on heparin Cardiology following  Obstructive apnea .  Acid reflux - PPI   Benign essential HTN Continue metoprolol, Imdur IV Labetalol PRN        Subjective: Pt seen awake resting in bed.  Continues to feel quite fatigued but no chest pain. Still quite short of breath on exertion as well.      Physical Exam: Vitals:   06/15/22 0040 06/15/22 0432 06/15/22 0753 06/15/22 1210  BP: 111/67 125/76 119/69 105/63  Pulse:  65 67 76  Resp: 18 (!) '21 16 15  '$ Temp:  97.7 F (36.5 C) 97.8 F (36.6 C) 97.9 F (36.6 C)  TempSrc:       SpO2:  98% 98% 98%  Weight:      Height:       General exam: awake, alert, no acute distress HEENT: atraumatic, clear conjunctiva, anicteric sclera, moist mucus membranes, hearing grossly normal  Respiratory system: CTAB, no wheezes, rales or rhonchi, normal respiratory effort. Cardiovascular system: normal S1/S2, RRR, no pedal edema.   Gastrointestinal system: soft, NT, ND, no HSM felt, +bowel sounds. Central nervous system: A&O x4. no gross focal neurologic deficits, normal speech Extremities: moves all, no edema, normal tone Skin: dry, intact, normal temperature Psychiatry: normal mood, congruent affect, judgement and insight appear normal   Data Reviewed:  Notable labs ---   Na 131, CO2 18, BUN 50, Cr 2.17 from 2.29, Hbg 10.7 stable  Family Communication: None present, will attempt to call  Disposition: Status is: Inpatient Remains inpatient appropriate because: remains on IV heparin x 48 hours. Discharge pending clearance by cardiology.   Planned Discharge Destination: Home    Time spent: 40 minutes  Author: Ezekiel Slocumb, DO 06/15/2022 2:53 PM  For on call review [email or web address].

## 2022-06-14 NOTE — Progress Notes (Signed)
ANTICOAGULATION CONSULT NOTE  Pharmacy Consult for heparin infusion  Indication: chest pain/ACS  Allergies  Allergen Reactions   Penicillins Itching, Swelling and Rash    Patient Measurements: Height: '5\' 6"'$  (167.6 cm) Weight: 68 kg (150 lb) IBW/kg (Calculated) : 63.8  Vital Signs: Temp: 97.9 F (36.6 C) (01/12 1943) Temp Source: Oral (01/12 1943) BP: 131/85 (01/12 2244) Pulse Rate: 96 (01/12 2244)  Labs: Recent Labs    06/12/22 1514 06/13/22 0315 06/13/22 0807 06/13/22 1034 06/13/22 1348 06/14/22 0027  HGB 11.4*  --   --   --  11.3*  --   HCT 35.7*  --   --   --  33.7*  --   PLT 121*  --   --   --  111*  --   APTT 52* 28  --   --  174* 83*  LABPROT 19.2*  --   --   --   --   --   INR 1.6*  --   --   --   --   --   HEPARINUNFRC  --  >1.10*  --   --  >1.10*  --   CREATININE 2.56*  --  2.29*  --   --   --   TROPONINIHS 1,474*  --  1,101* 899*  --   --      Estimated Creatinine Clearance: 19 mL/min (A) (by C-G formula based on SCr of 2.29 mg/dL (H)).   Medical History: Past Medical History:  Diagnosis Date   Absolute anemia 12/08/2014   Allergy    Anginal pain (HCC)    much less angina d/t lack of activity   Arthritis    all over body   Atrial fibrillation (HCC)    Bradycardia 05/03/2015   Calculus of kidney 12/08/2014   Coronary artery disease    GERD (gastroesophageal reflux disease)    Glaucoma    History of kidney stones    HLD (hyperlipidemia)    Hyperglycemia 08/27/2015   Hypertension    Macular degeneration    Myocardial infarction [facility]) 2011   Peripheral vascular disease (HCC)    VHD (valvular heart disease)     Medications:  Apixaban 2.'5mg'$  BID PTA  Assessment: 89 yo male admitted with shortness of breath and chest pressure. Pharmacy has been consulted for heparin infusion dosing and monitoring for NSTEMI. Patient has PMH of A. Fib, taking apixaban 2.'5mg'$  prior to admission. Last dose reported at 1100 this morning.   HS Troponin: 1474    Goal of Therapy:  Heparin level 0.3-0.7 units/ml aPTT 66-102 seconds Monitor platelets by anticoagulation protocol: Yes   1/12 0315 aPTT 28 subtherapeutic, HL > 1.1 1/13 0027 aPTT 83, therapeutic x 1  Plan: aPTT supra-therapeutic at 174 Continue heparin infusion at 750 units/hr Will need to monitor aPTT levels until aPTT and anti-Xa (heparin levels) correlate.  Check aPTT level in 8 hours after rate change and anti-Xa level in daily while on heparin Continue to monitor H&H and platelets  Renda Rolls, PharmD, [facility] 06/14/2022 1:49 AM

## 2022-06-14 NOTE — Progress Notes (Signed)
[facility] Cardiology    SUBJECTIVE: Patient feels reasonably well denies any chest pain still on heparin is ambulated in the room but not in the hall.  No nausea vomiting no fever chills or sweats no dyspnea   Vitals:   06/13/22 2244 06/14/22 0431 06/14/22 0530 06/14/22 0743  BP: 131/85 123/76 138/85 127/78  Pulse: 96 94 (!) 104 (!) 104  Resp: 19 (!) '21 18 18  '$ Temp:  98.3 F (36.8 C) 98.7 F (37.1 C) 98.1 F (36.7 C)  TempSrc:  Oral    SpO2: 96% 95% 97% 96%  Weight:   68.7 kg   Height:   '5\' 6"'$  (1.676 m)      Intake/Output Summary (Last 24 hours) at 06/14/2022 8469 Last data filed at 06/14/2022 0600 Gross per 24 hour  Intake 215.01 ml  Output 800 ml  Net -584.99 ml      PHYSICAL EXAM  General: Well developed, well nourished, in no acute distress HEENT:  Normocephalic and atramatic Neck:  No JVD.  Lungs: Clear bilaterally to auscultation and percussion. Heart: HRRR . Normal S1 and S2 without gallops or murmurs.  Abdomen: Bowel sounds are positive, abdomen soft and non-tender  Msk:  Back normal, normal gait. Normal strength and tone for age. Extremities: No clubbing, cyanosis or edema.   Neuro: Alert and oriented X 3. Psych:  Good affect, responds appropriately   LABS: Basic Metabolic Panel: Recent Labs    06/13/22 0807 06/14/22 0428  NA 133* 131*  K 3.6 3.7  CL 104 103  CO2 17* 18*  GLUCOSE 89 97  BUN 51* 50*  CREATININE 2.29* 2.17*  CALCIUM 8.0* 8.0*  MG  --  2.1   Liver Function Tests: No results for input(s): "AST", "ALT", "ALKPHOS", "BILITOT", "PROT", "ALBUMIN" in the last 72 hours. No results for input(s): "LIPASE", "AMYLASE" in the last 72 hours. CBC: Recent Labs    06/13/22 1348 06/14/22 0428  WBC 5.3 4.8  HGB 11.3* 10.7*  HCT 33.7* 32.2*  MCV 91.1 91.7  PLT 111* 101*   Cardiac Enzymes: No results for input(s): "CKTOTAL", "CKMB", "CKMBINDEX", "TROPONINI" in the last 72 hours. BNP: Invalid input(s): "POCBNP" D-Dimer: No results for  input(s): "DDIMER" in the last 72 hours. Hemoglobin A1C: No results for input(s): "HGBA1C" in the last 72 hours. Fasting Lipid Panel: No results for input(s): "CHOL", "HDL", "LDLCALC", "TRIG", "CHOLHDL", "LDLDIRECT" in the last 72 hours. Thyroid Function Tests: No results for input(s): "TSH", "T4TOTAL", "T3FREE", "THYROIDAB" in the last 72 hours.  Invalid input(s): "FREET3" Anemia Panel: No results for input(s): "VITAMINB12", "FOLATE", "FERRITIN", "TIBC", "IRON", "RETICCTPCT" in the last 72 hours.  DG Chest 2 View  Result Date: 06/12/2022 CLINICAL DATA:  Chest pain. EXAM: CHEST - 2 VIEW COMPARISON:  September 09, 2021. FINDINGS: Mild cardiomegaly is noted. Status post coronary bypass graft. Minimal bilateral pleural effusions are noted. No consolidative process is noted. Bony thorax is unremarkable. IMPRESSION: Minimal bilateral pleural effusions. Status post coronary bypass graft. Electronically Signed   By: Marijo Conception M.D.   On: 06/12/2022 16:22     Echo preliminary severely depressed left ventricular function EF between 20 to 25%  TELEMETRY: Paced at 66:  ASSESSMENT AND PLAN:  Principal Problem:   NSTEMI (non-ST elevated myocardial infarction) ([place]) Active Problems:   Benign essential HTN   Acid reflux   Obstructive apnea   Coronary artery disease involving native coronary artery of native heart with unstable angina pectoris [facility])   Atrial fibrillation ([place])  Hyperlipidemia   Sick sinus syndrome (HCC)   CKD (chronic kidney disease), stage IV ([place])    Plan Non-STEMI patient stable no further chest pain recommend aggressive medical therapy due for cardiac cath Hypertension reasonably controlled continue medical therapy History of obstructive sleep apnea CPAP as necessary Atrial fibrillation chronic persistent will resume Eliquis discontinue heparin Hyperlipidemia continue statin therapy for lipid management Sick sinus syndrome permanent pacemaker in place Chronic renal  insufficiency maintain adequate hydration Recommend increase activity if patient can remain relatively pain-free will consider discharge Patient should be maintained on Imdur 128-day a week for added nitroglycerin patch 0.6 in addition we will consider adding Ranexa 500 twice a day Patient can follow-up with cardiology 1 to 2 weeks after discharge  Yolonda Kida, MD 06/14/2022 9:24 AM

## 2022-06-15 DIAGNOSIS — I5042 Chronic combined systolic (congestive) and diastolic (congestive) heart failure: Secondary | ICD-10-CM | POA: Diagnosis present

## 2022-06-15 DIAGNOSIS — I214 Non-ST elevation (NSTEMI) myocardial infarction: Secondary | ICD-10-CM | POA: Diagnosis not present

## 2022-06-15 LAB — BASIC METABOLIC PANEL
Anion gap: 11 (ref 5–15)
BUN: 52 mg/dL — ABNORMAL HIGH (ref 8–23)
CO2: 19 mmol/L — ABNORMAL LOW (ref 22–32)
Calcium: 8.4 mg/dL — ABNORMAL LOW (ref 8.9–10.3)
Chloride: 98 mmol/L (ref 98–111)
Creatinine, Ser: 2.48 mg/dL — ABNORMAL HIGH (ref 0.61–1.24)
GFR, Estimated: 24 mL/min — ABNORMAL LOW (ref >60)
Glucose, Bld: 107 mg/dL — ABNORMAL HIGH (ref 70–99)
Potassium: 4.3 mmol/L (ref 3.5–5.1)
Sodium: 128 mmol/L — ABNORMAL LOW (ref 135–145)

## 2022-06-15 LAB — APTT: aPTT: 50 seconds — ABNORMAL HIGH (ref 24–36)

## 2022-06-15 MED ORDER — HYDRALAZINE HCL 25 MG PO TABS: 25.0000 mg | ORAL_TABLET | Freq: Two times a day (BID) | ORAL | 2 refills | Status: DC

## 2022-06-15 MED ORDER — HYDRALAZINE HCL 25 MG PO TABS
25.0000 mg | ORAL_TABLET | Freq: Two times a day (BID) | ORAL | Status: DC
  Administered 2022-06-15: 25 mg via ORAL
  Filled 2022-06-15: qty 1

## 2022-06-15 MED ORDER — RANOLAZINE ER 500 MG PO TB12
1000.0000 mg | ORAL_TABLET | Freq: Two times a day (BID) | ORAL | Status: DC
  Administered 2022-06-15: 1000 mg via ORAL
  Filled 2022-06-15: qty 2

## 2022-06-15 MED ORDER — ASPIRIN 81 MG PO CHEW: 81.0000 mg | CHEWABLE_TABLET | Freq: Every day | ORAL | 2 refills | Status: DC

## 2022-06-15 MED ORDER — RANOLAZINE ER 1000 MG PO TB12: 1000.0000 mg | ORAL_TABLET | Freq: Two times a day (BID) | ORAL | 2 refills | Status: DC

## 2022-06-15 MED ORDER — TORSEMIDE 20 MG PO TABS
10.0000 mg | ORAL_TABLET | Freq: Every day | ORAL | Status: DC
  Administered 2022-06-15: 10 mg via ORAL
  Filled 2022-06-15: qty 1

## 2022-06-15 MED ORDER — ATORVASTATIN CALCIUM 40 MG PO TABS: 40.0000 mg | ORAL_TABLET | Freq: Every day | ORAL | 2 refills | Status: DC

## 2022-06-15 MED ORDER — ISOSORBIDE MONONITRATE ER 120 MG PO TB24: 120.0000 mg | ORAL_TABLET | Freq: Every day | ORAL | 2 refills | Status: DC

## 2022-06-15 NOTE — Progress Notes (Signed)
Patient c/o chest pressure and shortness of breath when trying to lay down for sleep. PRN SL nitro given and will reassess symptoms in 5 minutes.  Update 0040: Patient reports pressure in chest has eased off. BP 111/67 post-treatment.  Earleen Reaper, RN

## 2022-06-15 NOTE — Discharge Summary (Signed)
Physician Discharge Summary   Patient: Austin [name] MRN: [id number] DOB: [birth year]  Admit date:     06/12/2022  Discharge date: 06/15/22  Discharge Physician: Ezekiel Slocumb   PCP: Mikey Kirschner, PA-C   Recommendations at discharge:    Follow up with Cardiology Follow up with Primary Care Repeat CBC, BMP, Mg in 1-2 weeks  Discharge Diagnoses: Principal Problem:   NSTEMI (non-ST elevated myocardial infarction) ([place]) Active Problems:   Chronic pain syndrome   Chronic Lumbar radiculitis (intermittent/recurrent) (Bilateral)   Benign essential HTN   Acid reflux   HLD (hyperlipidemia)   Obstructive apnea   Cardiomyopathy, ischemic   Coronary artery disease involving native coronary artery of native heart with unstable angina pectoris (HCC)   Lumbar central spinal stenosis (Severe at L4-5)   Atrial fibrillation (HCC)   Degeneration of lumbar intervertebral disc   Hyperlipidemia   Sick sinus syndrome (HCC)   CKD (chronic kidney disease), stage IV (HCC)   Chronic combined systolic and diastolic CHF (congestive heart failure) ([place])  Resolved Problems:   * No resolved hospital problems. [facility] Course: [name] is a 89 year old male with history of CKD stage IV, CAD, hyperlipidemia, sick sinus syndrome, atrial fibrillation on Eliquis, who presented to the ED on 06/12/2022 for evaluation of shortness of breath with exertion and chest discomfort. He was found to have hs-troponin elevation at 1474. Admitted to medicine service with cardiology consulted for further evaluation and management of NSTEMI.  Started on heparin drip.  Assessment and Plan: * NSTEMI (non-ST elevated myocardial infarction) ([place]) Mgmt per Cardiology Treated with IV heparin x 48 hrs No plan for cardiac cath given CKD stage IV Continue ASA, metoprolol, statin, Imdur, Started on Ranexa, hydralazine Imdur increased BP did not tolerate nitro patch PRN SL nitro in chest pain   Chronic  combined systolic and diastolic CHF (congestive heart failure) ([place]) Euvolemic. Continue torsemide, metoprolol Started on hydralazine   CKD (chronic kidney disease), stage IV ([place]) Renal function near baseline. Monitor BMP. Renally dose meds & avoid nephrotoxins, IV contrast  Sick sinus syndrome (HCC) Has pacemaker   Hyperlipidemia Continue statin  Atrial fibrillation (HCC) HR controlled. Eliquis on hold. On heparin drip for NSTEMI. Continue metoprolol Telemetry Cardiology following  Coronary artery disease involving native coronary artery of native heart with unstable angina pectoris (HCC) Continue ASA, statin Currently with NSTEMI, see above Cardiology following  Obstructive apnea .  Acid reflux - PPI   Benign essential HTN Continue metoprolol, Imdur IV Labetalol PRN         Consultants: Cardiology Procedures performed: None  Disposition: Home Diet recommendation:  Discharge Diet Orders (From admission, onward)     Start     Ordered   06/15/22 0000  Diet - low sodium heart healthy        06/15/22 1511           Cardiac diet DISCHARGE MEDICATION: Allergies as of 06/15/2022       Reactions   Penicillins Itching, Swelling, Rash        Medication List     STOP taking these medications    amLODipine 5 MG tablet Commonly known as: NORVASC       TAKE these medications    apixaban 2.5 MG Tabs tablet Commonly known as: ELIQUIS Take 1 tablet (2.5 mg total) by mouth 2 (two) times daily.   aspirin 81 MG chewable tablet Chew 1 tablet (81 mg total) by mouth daily. Start taking on: June 16, 2022   atorvastatin 40 MG tablet Commonly known as: LIPITOR Take 1 tablet (40 mg total) by mouth daily. What changed:  medication strength how much to take   Biotin 5000 MCG Caps Take 5,000 mcg by mouth daily.   COQ-10 PO Take 200 mg by mouth daily.   dorzolamide-timolol 2-0.5 % ophthalmic solution Commonly known as: COSOPT Place 1 drop  into both eyes 2 (two) times daily.   hydrALAZINE 25 MG tablet Commonly known as: APRESOLINE Take 1 tablet (25 mg total) by mouth 2 (two) times daily.   isosorbide mononitrate 120 MG 24 hr tablet Commonly known as: IMDUR Take 1 tablet (120 mg total) by mouth daily. Start taking on: June 16, 2022 What changed:  medication strength how much to take   latanoprost 0.005 % ophthalmic solution Commonly known as: XALATAN Place 1 drop into both eyes at bedtime.   Melatonin 5 MG Caps Take 5 mg by mouth at bedtime as needed (for sleep).   metoprolol tartrate 50 MG tablet Commonly known as: LOPRESSOR Take 0.5 tablets (25 mg total) by mouth 2 (two) times daily.   nitroGLYCERIN 0.4 MG SL tablet Commonly known as: NITROSTAT Place 0.4 mg under the tongue every 5 (five) minutes as needed for chest pain.   omeprazole 20 MG capsule Commonly known as: PRILOSEC Take 20 mg by mouth daily.   ranolazine 1000 MG SR tablet Commonly known as: RANEXA Take 1 tablet (1,000 mg total) by mouth 2 (two) times daily.   SYSTANE OP Place 1 drop into the right eye daily as needed (burning).   tamsulosin 0.4 MG Caps capsule Commonly known as: FLOMAX TAKE 1 CAPSULE BY MOUTH ONCE DAILY   torsemide 20 MG tablet Commonly known as: DEMADEX Take 10 mg by mouth daily.   TURMERIC PO Take 1,000 mg by mouth daily.   Vitamin B-12 5000 MCG Subl Place 5,000 mcg under the tongue daily.   Vitamin D3 50 MCG (2000 UT) capsule Take 2,000 Units by mouth 2 (two) times daily.        Discharge Exam: Filed Weights   06/12/22 1509 06/14/22 0530  Weight: 68 kg 68.7 kg   General exam: awake, alert, no acute distress HEENT: atraumatic, clear conjunctiva, anicteric sclera, moist mucus membranes, hearing grossly normal  Respiratory system: CTAB, no wheezes, rales or rhonchi, normal respiratory effort. Cardiovascular system: normal S1/S2,  RRR, no JVD, murmurs, rubs, gallops,  no pedal edema.   Gastrointestinal  system: soft, NT, ND, no HSM felt, +bowel sounds. Central nervous system: A&O x3. no gross focal neurologic deficits, normal speech Extremities: moves all , no edema, normal tone Skin: dry, intact, normal temperature, normal color, No rashes, lesions or ulcers Psychiatry: normal mood, congruent affect, judgement and insight appear normal   Condition at discharge: stable  The results of significant diagnostics from this hospitalization (including imaging, microbiology, ancillary and laboratory) are listed below for reference.   Imaging Studies: ECHOCARDIOGRAM COMPLETE  Result Date: 06/14/2022    ECHOCARDIOGRAM REPORT   Patient Name:   Austin [name] Date of Exam: 06/13/2022 Medical Rec #:  [id number]        Height:       66.0 in Accession #:    [id number]       Weight:       150.0 lb Date of Birth:  1929-04-27         BSA:          1.770 m Patient Age:    14  years         BP:           135/81 mmHg Patient Gender: M                HR:           71 bpm. Exam Location:  [facility] Procedure: 2D Echo, Cardiac Doppler and Color Doppler Indications:     Elevated troponin                  Dyspnea R06.00                  Chest pain R07.9  History:         Patient has prior history of Echocardiogram examinations, most                  recent 07/11/2019.  Sonographer:     Sherrie Sport Referring Phys:  [id number] AMY N COX Diagnosing Phys: Yolonda Kida MD IMPRESSIONS  1. Left ventricular ejection fraction, by estimation, is 25 to 30%. The left ventricle has severely decreased function. The left ventricle demonstrates global hypokinesis. The left ventricular internal cavity size was mildly to moderately dilated. There  is mild left ventricular hypertrophy. Left ventricular diastolic parameters are consistent with Grade III diastolic dysfunction (restrictive).  2. Right ventricular systolic function is moderately reduced. The right ventricular size is mildly enlarged.  3. Left atrial size was mildly dilated.  4. Right atrial  size was mildly dilated.  5. The mitral valve is grossly normal. Mild mitral valve regurgitation.  6. The aortic valve is grossly normal. Aortic valve regurgitation is trivial. FINDINGS  Left Ventricle: Left ventricular ejection fraction, by estimation, is 25 to 30%. The left ventricle has severely decreased function. The left ventricle demonstrates global hypokinesis. The left ventricular internal cavity size was mildly to moderately dilated. There is mild left ventricular hypertrophy. Left ventricular diastolic parameters are consistent with Grade III diastolic dysfunction (restrictive). Right Ventricle: The right ventricular size is mildly enlarged. No increase in right ventricular wall thickness. Right ventricular systolic function is moderately reduced. Left Atrium: Left atrial size was mildly dilated. Right Atrium: Right atrial size was mildly dilated. Pericardium: There is no evidence of pericardial effusion. Mitral Valve: The mitral valve is grossly normal. Mild mitral valve regurgitation. Tricuspid Valve: The tricuspid valve is normal in structure. Tricuspid valve regurgitation is mild. Aortic Valve: The aortic valve is grossly normal. Aortic valve regurgitation is trivial. Aortic valve mean gradient measures 8.0 mmHg. Aortic valve peak gradient measures 14.2 mmHg. Aortic valve area, by VTI measures 1.21 cm. Pulmonic Valve: The pulmonic valve was grossly normal. Pulmonic valve regurgitation is not visualized. Aorta: The ascending aorta was not well visualized. IAS/Shunts: No atrial level shunt detected by color flow Doppler. Additional Comments: A device lead is visualized.  LEFT VENTRICLE PLAX 2D LVIDd:         5.30 cm      Diastology LVIDs:         4.80 cm      LV e' medial:    3.92 cm/s LV PW:         1.20 cm      LV E/e' medial:  35.2 LV IVS:        1.20 cm      LV e' lateral:   10.20 cm/s LVOT diam:     2.00 cm      LV E/e' lateral: 13.5 LV SV:  36 LV SV Index:   20 LVOT Area:     3.14 cm  LV  Volumes (MOD) LV vol d, MOD A2C: 125.0 ml LV vol d, MOD A4C: 130.0 ml LV vol s, MOD A2C: 86.6 ml LV vol s, MOD A4C: 84.4 ml LV SV MOD A2C:     38.4 ml LV SV MOD A4C:     130.0 ml LV SV MOD BP:      44.2 ml RIGHT VENTRICLE RV Basal diam:  3.90 cm RV Mid diam:    3.50 cm RV S prime:     7.62 cm/s TAPSE (M-mode): 1.4 cm LEFT ATRIUM             Index        RIGHT ATRIUM           Index LA diam:        4.65 cm 2.63 cm/m   RA Area:     15.60 cm LA Vol (A2C):   75.8 ml 42.83 ml/m  RA Volume:   36.60 ml  20.68 ml/m LA Vol (A4C):   46.0 ml 25.99 ml/m LA Biplane Vol: 61.1 ml 34.53 ml/m  AORTIC VALVE AV Area (Vmax):    1.21 cm AV Area (Vmean):   1.14 cm AV Area (VTI):     1.21 cm AV Vmax:           188.67 cm/s AV Vmean:          130.667 cm/s AV VTI:            0.297 m AV Peak Grad:      14.2 mmHg AV Mean Grad:      8.0 mmHg LVOT Vmax:         72.70 cm/s LVOT Vmean:        47.400 cm/s LVOT VTI:          0.114 m LVOT/AV VTI ratio: 0.38  AORTA Ao Root diam: 3.20 cm MITRAL VALVE                TRICUSPID VALVE MV Area (PHT): 8.43 cm     TR Peak grad:   30.0 mmHg MV Decel Time: 90 msec      TR Vmax:        274.00 cm/s MV E velocity: 138.00 cm/s MV A velocity: 59.70 cm/s   SHUNTS MV E/A ratio:  2.31         Systemic VTI:  0.11 m                             Systemic Diam: 2.00 cm Yolonda Kida MD Electronically signed by Yolonda Kida MD Signature Date/Time: 06/14/2022/9:33:40 PM    Final    DG Chest 2 View  Result Date: 06/12/2022 CLINICAL DATA:  Chest pain. EXAM: CHEST - 2 VIEW COMPARISON:  September 09, 2021. FINDINGS: Mild cardiomegaly is noted. Status post coronary bypass graft. Minimal bilateral pleural effusions are noted. No consolidative process is noted. Bony thorax is unremarkable. IMPRESSION: Minimal bilateral pleural effusions. Status post coronary bypass graft. Electronically Signed   By: Marijo Conception M.D.   On: 06/12/2022 16:22    Microbiology: Results for orders placed or performed during the  hospital encounter of 07/09/19  Respiratory Panel by RT PCR (Flu A&B, Covid) - Nasopharyngeal Swab     Status: None   Collection Time: 07/09/19 11:42 AM   Specimen: Nasopharyngeal Swab  Result  Value Ref Range Status   SARS Coronavirus 2 by RT PCR NEGATIVE NEGATIVE Final    Comment: (NOTE) SARS-CoV-2 target nucleic acids are NOT DETECTED. The SARS-CoV-2 RNA is generally detectable in upper respiratoy specimens during the acute phase of infection. The lowest concentration of SARS-CoV-2 viral copies this assay can detect is 131 copies/mL. A negative result does not preclude SARS-Cov-2 infection and should not be used as the sole basis for treatment or other patient management decisions. A negative result may occur with  improper specimen collection/handling, submission of specimen other than nasopharyngeal swab, presence of viral mutation(s) within the areas targeted by this assay, and inadequate number of viral copies (<131 copies/mL). A negative result must be combined with clinical observations, patient history, and epidemiological information. The expected result is Negative. Fact Sheet for Patients:  PinkCheek.be Fact Sheet for Healthcare Providers:  GravelBags.it This test is not yet ap proved or cleared by the [place] FDA and  has been authorized for detection and/or diagnosis of SARS-CoV-2 by FDA under an Emergency Use Authorization (EUA). This EUA will remain  in effect (meaning this test can be used) for the duration of the COVID-19 declaration under Section 564(b)(1) of the Act, 21 U.S.C. section 360bbb-3(b)(1), unless the authorization is terminated or revoked sooner.    Influenza A by PCR NEGATIVE NEGATIVE Final   Influenza B by PCR NEGATIVE NEGATIVE Final    Comment: (NOTE) The Xpert Xpress SARS-CoV-2/FLU/RSV assay is intended as an aid in  the diagnosis of influenza from Nasopharyngeal swab specimens and   should not be used as a sole basis for treatment. Nasal washings and  aspirates are unacceptable for Xpert Xpress SARS-CoV-2/FLU/RSV  testing. Fact Sheet for Patients: PinkCheek.be Fact Sheet for Healthcare Providers: GravelBags.it This test is not yet approved or cleared by the [place] FDA and  has been authorized for detection and/or diagnosis of SARS-CoV-2 by  FDA under an Emergency Use Authorization (EUA). This EUA will remain  in effect (meaning this test can be used) for the duration of the  Covid-19 declaration under Section 564(b)(1) of the Act, 21  U.S.C. section 360bbb-3(b)(1), unless the authorization is  terminated or revoked. Performed at [facility], [address] [place], [place] [address]   MRSA PCR Screening     Status: None   Collection Time: 07/09/19  3:29 PM   Specimen: Nasopharyngeal  Result Value Ref Range Status   MRSA by PCR NEGATIVE NEGATIVE Final    Comment:        The GeneXpert MRSA Assay (FDA approved for NASAL specimens only), is one component of a comprehensive MRSA colonization surveillance program. It is not intended to diagnose MRSA infection nor to guide or monitor treatment for MRSA infections. Performed at [facility], [address] [place], [place] [address]   CULTURE, BLOOD (ROUTINE X 2) w Reflex to ID Panel     Status: None   Collection Time: 07/09/19  7:00 PM   Specimen: BLOOD  Result Value Ref Range Status   Specimen Description BLOOD LEFT HAND  Final   Special Requests   Final    BOTTLES DRAWN AEROBIC AND ANAEROBIC Blood Culture adequate volume   Culture   Final    NO GROWTH 5 DAYS Performed at [facility], [address] [place],  [address]    Report Status 07/14/2019 FINAL  Final  CULTURE, BLOOD (ROUTINE X 2) w Reflex to ID Panel     Status: None  Collection Time: 07/09/19  7:15 PM   Specimen: BLOOD  Result Value  Ref Range Status   Specimen Description BLOOD LEFT ANTECUBITAL  Final   Special Requests   Final    BOTTLES DRAWN AEROBIC AND ANAEROBIC Blood Culture adequate volume   Culture   Final    NO GROWTH 5 DAYS Performed at [facility], [address] [place], [place] [address]    Report Status 07/14/2019 FINAL  Final    Labs: CBC: Recent Labs  Lab 06/12/22 1514 06/13/22 1348 06/14/22 0428  WBC 4.8 5.3 4.8  HGB 11.4* 11.3* 10.7*  HCT 35.7* 33.7* 32.2*  MCV 93.9 91.1 91.7  PLT 121* 111* 078*   Basic Metabolic Panel: Recent Labs  Lab 06/12/22 1514 06/13/22 0807 06/14/22 0428 06/15/22 0520  NA 130* 133* 131* 128*  K 3.8 3.6 3.7 4.3  CL 98 104 103 98  CO2 20* 17* 18* 19*  GLUCOSE 141* 89 97 107*  BUN 56* 51* 50* 52*  CREATININE 2.56* 2.29* 2.17* 2.48*  CALCIUM 8.4* 8.0* 8.0* 8.4*  MG  --   --  2.1  --    Liver Function Tests: No results for input(s): "AST", "ALT", "ALKPHOS", "BILITOT", "PROT", "ALBUMIN" in the last 168 hours. CBG: No results for input(s): "GLUCAP" in the last 168 hours.  Discharge time spent: less than 30 minutes.  Signed: Ezekiel Slocumb, DO [facility] 06/15/2022

## 2022-06-15 NOTE — Consult Note (Signed)
WOC Nurse Consult Note: Reason for Consult:full thickness wound on 3rd digit of right foot. Followed by Podiatry, Dr. Amalia Hailey (podiatric medicine) for this wound. Last seen on 06/06/22. See photo provided to EHR by Dr. Amalia Hailey on 06/06/22. Wound type:idiopathic Pressure Injury POA: NA Wound bed:red, moist Drainage (amount, consistency, odor) small serous Periwound:intact Dressing procedure/placement/frequency: Continue with neosporin ointment and an adhesive strip bandage daily after cleansing with soap and water. Follow up with Dr Amalia Hailey as directed.  [facility] nursing team will not follow, but will remain available to this patient, the nursing and medical teams.  Please re-consult if needed.  Thank you for inviting [place] to participate in this patient's Plan of Care.  Maudie Flakes, MSN, RN, CNS, [place], Serita Grammes, [employer], [employer] phone:  [phone]

## 2022-06-15 NOTE — Progress Notes (Signed)
[facility] Cardiology    SUBJECTIVE: Patient states he feels somewhat better still some fatigue and weakness but denies any chest pain currently or shortness of breath   Vitals:   06/15/22 0028 06/15/22 0040 06/15/22 0432 06/15/22 0753  BP: 121/67 111/67 125/76 119/69  Pulse: 64  65 67  Resp: 20 18 (!) 21 16  Temp: 97.7 F (36.5 C)  97.7 F (36.5 C) 97.8 F (36.6 C)  TempSrc:      SpO2: 97%  98% 98%  Weight:      Height:        No intake or output data in the 24 hours ending 06/15/22 0904    PHYSICAL EXAM  General: Well developed, well nourished, in no acute distress HEENT:  Normocephalic and atramatic Neck:  No JVD.  Lungs: Clear bilaterally to auscultation and percussion. Heart: HRRR . Normal S1 and S2 without gallops or murmurs.  Abdomen: Bowel sounds are positive, abdomen soft and non-tender  Msk:  Back normal, normal gait. Normal strength and tone for age. Extremities: No clubbing, cyanosis or edema.   Neuro: Alert and oriented X 3. Psych:  Good affect, responds appropriately   LABS: Basic Metabolic Panel: Recent Labs    06/14/22 0428 06/15/22 0520  NA 131* 128*  K 3.7 4.3  CL 103 98  CO2 18* 19*  GLUCOSE 97 107*  BUN 50* 52*  CREATININE 2.17* 2.48*  CALCIUM 8.0* 8.4*  MG 2.1  --    Liver Function Tests: No results for input(s): "AST", "ALT", "ALKPHOS", "BILITOT", "PROT", "ALBUMIN" in the last 72 hours. No results for input(s): "LIPASE", "AMYLASE" in the last 72 hours. CBC: Recent Labs    06/13/22 1348 06/14/22 0428  WBC 5.3 4.8  HGB 11.3* 10.7*  HCT 33.7* 32.2*  MCV 91.1 91.7  PLT 111* 101*   Cardiac Enzymes: No results for input(s): "CKTOTAL", "CKMB", "CKMBINDEX", "TROPONINI" in the last 72 hours. BNP: Invalid input(s): "POCBNP" D-Dimer: No results for input(s): "DDIMER" in the last 72 hours. Hemoglobin A1C: No results for input(s): "HGBA1C" in the last 72 hours. Fasting Lipid Panel: No results for input(s): "CHOL", "HDL", "LDLCALC",  "TRIG", "CHOLHDL", "LDLDIRECT" in the last 72 hours. Thyroid Function Tests: No results for input(s): "TSH", "T4TOTAL", "T3FREE", "THYROIDAB" in the last 72 hours.  Invalid input(s): "FREET3" Anemia Panel: No results for input(s): "VITAMINB12", "FOLATE", "FERRITIN", "TIBC", "IRON", "RETICCTPCT" in the last 72 hours.  No results found.   Echo failure depressed left ventricular function EF 25 to 30%  TELEMETRY: Ventricular paced rhythm 66:  ASSESSMENT AND PLAN:  Principal Problem:   NSTEMI (non-ST elevated myocardial infarction) ([place]) Active Problems:   Benign essential HTN   Acid reflux   HLD (hyperlipidemia)   Obstructive apnea   Cardiomyopathy, ischemic   Coronary artery disease involving native coronary artery of native heart with unstable angina pectoris (HCC)   Chronic pain syndrome   Lumbar central spinal stenosis (Severe at L4-5)   Chronic Lumbar radiculitis (intermittent/recurrent) (Bilateral)   Atrial fibrillation (HCC)   Degeneration of lumbar intervertebral disc   Hyperlipidemia   Sick sinus syndrome (HCC)   CKD (chronic kidney disease), stage IV (HCC)   Chronic combined systolic and diastolic CHF (congestive heart failure) (HCC)    Plan Non-STEMI with presentation stable and improved chest pain symptoms recommend aggressive medical therapy Cardiomyopathy systolic dysfunction continue heart failure therapy diuretics beta-blockers Congestive heart failure acute on chronic systolic dysfunction continue diuresis beta-blockers and hydralazine Acute on chronic renal insufficiency avoid nephrotoxic drugs  consider nephrology input Atrial fibrillation paroxysmal continue Eliquis for anticoagulation and rate control with beta-blockers Sick sinus syndrome status post permanent pacemaker in place, the patient follow-up with pacer clinic Hyperlipidemia continue statin therapy for lipid management Have the patient increase activity to ambulate in the hall for strength  hopefully can discharge home Consider physical therapy if the patient complains of weakness and fatigue We have advanced his pain medications for angina including Ranexa     Yolonda Kida, MD 06/15/2022 9:04 AM

## 2022-06-15 NOTE — Assessment & Plan Note (Signed)
Euvolemic. Continue torsemide, metoprolol Started on hydralazine

## 2022-06-16 ENCOUNTER — Telehealth: Payer: Self-pay | Admitting: *Deleted

## 2022-06-16 NOTE — Patient Outreach (Signed)
  Care Coordination [facility] Note Transition Care Management Unsuccessful Follow-up Telephone Call  Date of discharge and from where:  [phone] [facility]  Attempts:  1st Attempt  Reason for unsuccessful TCM follow-up call:  Left voice message  [place] Management [phone]

## 2022-06-18 ENCOUNTER — Telehealth: Payer: Self-pay | Admitting: *Deleted

## 2022-06-18 DIAGNOSIS — I6523 Occlusion and stenosis of bilateral carotid arteries: Secondary | ICD-10-CM | POA: Diagnosis not present

## 2022-06-18 DIAGNOSIS — I495 Sick sinus syndrome: Secondary | ICD-10-CM | POA: Diagnosis not present

## 2022-06-18 DIAGNOSIS — I10 Essential (primary) hypertension: Secondary | ICD-10-CM | POA: Diagnosis not present

## 2022-06-18 DIAGNOSIS — I480 Paroxysmal atrial fibrillation: Secondary | ICD-10-CM | POA: Diagnosis not present

## 2022-06-18 DIAGNOSIS — I739 Peripheral vascular disease, unspecified: Secondary | ICD-10-CM | POA: Diagnosis not present

## 2022-06-18 DIAGNOSIS — N1832 Chronic kidney disease, stage 3b: Secondary | ICD-10-CM | POA: Diagnosis not present

## 2022-06-18 DIAGNOSIS — I25810 Atherosclerosis of coronary artery bypass graft(s) without angina pectoris: Secondary | ICD-10-CM | POA: Diagnosis not present

## 2022-06-18 DIAGNOSIS — I350 Nonrheumatic aortic (valve) stenosis: Secondary | ICD-10-CM | POA: Diagnosis not present

## 2022-06-18 DIAGNOSIS — E782 Mixed hyperlipidemia: Secondary | ICD-10-CM | POA: Diagnosis not present

## 2022-06-18 DIAGNOSIS — I4892 Unspecified atrial flutter: Secondary | ICD-10-CM | POA: Diagnosis not present

## 2022-06-18 NOTE — Patient Outreach (Signed)
  Care Coordination TOC Note Transition Care Management Unsuccessful Follow-up Telephone Call  Date of discharge and from where:  [facility] 06/15/2022  NSTEMI  Attempts:  2nd Attempt  Reason for unsuccessful TCM follow-up call:  Left voice message  [place] Care Management [phone]

## 2022-06-24 ENCOUNTER — Ambulatory Visit: Payer: PPO | Admitting: Podiatry

## 2022-06-24 DIAGNOSIS — H353211 Exudative age-related macular degeneration, right eye, with active choroidal neovascularization: Secondary | ICD-10-CM | POA: Diagnosis not present

## 2022-06-25 ENCOUNTER — Telehealth: Payer: Self-pay | Admitting: *Deleted

## 2022-06-25 NOTE — Patient Outreach (Signed)
  Care Coordination [facility] Note Transition Care Management Unsuccessful Follow-up Telephone Call  Date of discharge and from where:  [facility] [id number]   Attempts:  3rd Attempt  Reason for unsuccessful TCM follow-up call:  No answer/busy  [place] Management [phone]

## 2022-07-01 DIAGNOSIS — I10 Essential (primary) hypertension: Secondary | ICD-10-CM | POA: Diagnosis not present

## 2022-07-01 DIAGNOSIS — I350 Nonrheumatic aortic (valve) stenosis: Secondary | ICD-10-CM | POA: Diagnosis not present

## 2022-07-01 DIAGNOSIS — I480 Paroxysmal atrial fibrillation: Secondary | ICD-10-CM | POA: Diagnosis not present

## 2022-07-01 DIAGNOSIS — I340 Nonrheumatic mitral (valve) insufficiency: Secondary | ICD-10-CM | POA: Diagnosis not present

## 2022-07-01 DIAGNOSIS — I25810 Atherosclerosis of coronary artery bypass graft(s) without angina pectoris: Secondary | ICD-10-CM | POA: Diagnosis not present

## 2022-07-01 DIAGNOSIS — N1832 Chronic kidney disease, stage 3b: Secondary | ICD-10-CM | POA: Diagnosis not present

## 2022-07-01 DIAGNOSIS — E871 Hypo-osmolality and hyponatremia: Secondary | ICD-10-CM | POA: Diagnosis not present

## 2022-07-01 DIAGNOSIS — G959 Disease of spinal cord, unspecified: Secondary | ICD-10-CM | POA: Diagnosis not present

## 2022-07-07 ENCOUNTER — Inpatient Hospital Stay: Payer: PPO

## 2022-07-07 ENCOUNTER — Inpatient Hospital Stay
Admission: EM | Admit: 2022-07-07 | Discharge: 2022-07-11 | DRG: 291 | Disposition: A | Discharge status: Hospice | Payer: PPO | Attending: Internal Medicine | Admitting: Internal Medicine

## 2022-07-07 ENCOUNTER — Emergency Department: Payer: PPO

## 2022-07-07 ENCOUNTER — Other Ambulatory Visit: Payer: Self-pay

## 2022-07-07 DIAGNOSIS — Z7901 Long term (current) use of anticoagulants: Secondary | ICD-10-CM | POA: Diagnosis not present

## 2022-07-07 DIAGNOSIS — Z79899 Other long term (current) drug therapy: Secondary | ICD-10-CM

## 2022-07-07 DIAGNOSIS — R001 Bradycardia, unspecified: Secondary | ICD-10-CM | POA: Diagnosis not present

## 2022-07-07 DIAGNOSIS — G894 Chronic pain syndrome: Secondary | ICD-10-CM | POA: Diagnosis not present

## 2022-07-07 DIAGNOSIS — R0789 Other chest pain: Secondary | ICD-10-CM | POA: Diagnosis not present

## 2022-07-07 DIAGNOSIS — I480 Paroxysmal atrial fibrillation: Secondary | ICD-10-CM | POA: Diagnosis not present

## 2022-07-07 DIAGNOSIS — E876 Hypokalemia: Secondary | ICD-10-CM | POA: Diagnosis not present

## 2022-07-07 DIAGNOSIS — N400 Enlarged prostate without lower urinary tract symptoms: Secondary | ICD-10-CM | POA: Diagnosis not present

## 2022-07-07 DIAGNOSIS — R188 Other ascites: Secondary | ICD-10-CM | POA: Diagnosis not present

## 2022-07-07 DIAGNOSIS — I130 Hypertensive heart and chronic kidney disease with heart failure and stage 1 through stage 4 chronic kidney disease, or unspecified chronic kidney disease: Principal | ICD-10-CM | POA: Diagnosis present

## 2022-07-07 DIAGNOSIS — I5022 Chronic systolic (congestive) heart failure: Secondary | ICD-10-CM | POA: Diagnosis not present

## 2022-07-07 DIAGNOSIS — I110 Hypertensive heart disease with heart failure: Secondary | ICD-10-CM | POA: Diagnosis not present

## 2022-07-07 DIAGNOSIS — R609 Edema, unspecified: Secondary | ICD-10-CM | POA: Diagnosis not present

## 2022-07-07 DIAGNOSIS — I25709 Atherosclerosis of coronary artery bypass graft(s), unspecified, with unspecified angina pectoris: Secondary | ICD-10-CM | POA: Diagnosis not present

## 2022-07-07 DIAGNOSIS — E871 Hypo-osmolality and hyponatremia: Secondary | ICD-10-CM | POA: Diagnosis not present

## 2022-07-07 DIAGNOSIS — R350 Frequency of micturition: Secondary | ICD-10-CM | POA: Diagnosis not present

## 2022-07-07 DIAGNOSIS — I495 Sick sinus syndrome: Secondary | ICD-10-CM | POA: Diagnosis not present

## 2022-07-07 DIAGNOSIS — R109 Unspecified abdominal pain: Secondary | ICD-10-CM | POA: Diagnosis not present

## 2022-07-07 DIAGNOSIS — Z950 Presence of cardiac pacemaker: Secondary | ICD-10-CM

## 2022-07-07 DIAGNOSIS — Z862 Personal history of diseases of the blood and blood-forming organs and certain disorders involving the immune mechanism: Secondary | ICD-10-CM

## 2022-07-07 DIAGNOSIS — D696 Thrombocytopenia, unspecified: Secondary | ICD-10-CM | POA: Diagnosis present

## 2022-07-07 DIAGNOSIS — E872 Acidosis, unspecified: Secondary | ICD-10-CM

## 2022-07-07 DIAGNOSIS — Z66 Do not resuscitate: Secondary | ICD-10-CM | POA: Diagnosis present

## 2022-07-07 DIAGNOSIS — I5023 Acute on chronic systolic (congestive) heart failure: Secondary | ICD-10-CM

## 2022-07-07 DIAGNOSIS — I400 Infective myocarditis: Secondary | ICD-10-CM | POA: Diagnosis not present

## 2022-07-07 DIAGNOSIS — D631 Anemia in chronic kidney disease: Secondary | ICD-10-CM | POA: Diagnosis not present

## 2022-07-07 DIAGNOSIS — I5043 Acute on chronic combined systolic (congestive) and diastolic (congestive) heart failure: Secondary | ICD-10-CM | POA: Diagnosis not present

## 2022-07-07 DIAGNOSIS — I25710 Atherosclerosis of autologous vein coronary artery bypass graft(s) with unstable angina pectoris: Secondary | ICD-10-CM | POA: Diagnosis not present

## 2022-07-07 DIAGNOSIS — R441 Visual hallucinations: Secondary | ICD-10-CM | POA: Diagnosis not present

## 2022-07-07 DIAGNOSIS — Z951 Presence of aortocoronary bypass graft: Secondary | ICD-10-CM

## 2022-07-07 DIAGNOSIS — I5020 Unspecified systolic (congestive) heart failure: Secondary | ICD-10-CM | POA: Diagnosis present

## 2022-07-07 DIAGNOSIS — I4891 Unspecified atrial fibrillation: Secondary | ICD-10-CM | POA: Diagnosis present

## 2022-07-07 DIAGNOSIS — Z8249 Family history of ischemic heart disease and other diseases of the circulatory system: Secondary | ICD-10-CM

## 2022-07-07 DIAGNOSIS — I10 Essential (primary) hypertension: Secondary | ICD-10-CM | POA: Diagnosis not present

## 2022-07-07 DIAGNOSIS — R230 Cyanosis: Secondary | ICD-10-CM | POA: Diagnosis not present

## 2022-07-07 DIAGNOSIS — N179 Acute kidney failure, unspecified: Secondary | ICD-10-CM | POA: Diagnosis present

## 2022-07-07 DIAGNOSIS — G4733 Obstructive sleep apnea (adult) (pediatric): Secondary | ICD-10-CM | POA: Diagnosis present

## 2022-07-07 DIAGNOSIS — E785 Hyperlipidemia, unspecified: Secondary | ICD-10-CM | POA: Diagnosis present

## 2022-07-07 DIAGNOSIS — I255 Ischemic cardiomyopathy: Secondary | ICD-10-CM | POA: Diagnosis not present

## 2022-07-07 DIAGNOSIS — I2489 Other forms of acute ischemic heart disease: Secondary | ICD-10-CM | POA: Diagnosis not present

## 2022-07-07 DIAGNOSIS — N189 Chronic kidney disease, unspecified: Secondary | ICD-10-CM | POA: Diagnosis not present

## 2022-07-07 DIAGNOSIS — N184 Chronic kidney disease, stage 4 (severe): Secondary | ICD-10-CM | POA: Diagnosis present

## 2022-07-07 DIAGNOSIS — I5042 Chronic combined systolic (congestive) and diastolic (congestive) heart failure: Secondary | ICD-10-CM | POA: Diagnosis present

## 2022-07-07 DIAGNOSIS — I959 Hypotension, unspecified: Secondary | ICD-10-CM | POA: Diagnosis not present

## 2022-07-07 DIAGNOSIS — I509 Heart failure, unspecified: Principal | ICD-10-CM

## 2022-07-07 DIAGNOSIS — I252 Old myocardial infarction: Secondary | ICD-10-CM

## 2022-07-07 DIAGNOSIS — I472 Ventricular tachycardia, unspecified: Secondary | ICD-10-CM | POA: Diagnosis not present

## 2022-07-07 DIAGNOSIS — G9340 Encephalopathy, unspecified: Secondary | ICD-10-CM | POA: Diagnosis not present

## 2022-07-07 DIAGNOSIS — I2510 Atherosclerotic heart disease of native coronary artery without angina pectoris: Secondary | ICD-10-CM | POA: Diagnosis not present

## 2022-07-07 DIAGNOSIS — R110 Nausea: Secondary | ICD-10-CM | POA: Diagnosis not present

## 2022-07-07 DIAGNOSIS — N401 Enlarged prostate with lower urinary tract symptoms: Secondary | ICD-10-CM | POA: Diagnosis not present

## 2022-07-07 DIAGNOSIS — J811 Chronic pulmonary edema: Secondary | ICD-10-CM | POA: Diagnosis not present

## 2022-07-07 DIAGNOSIS — R079 Chest pain, unspecified: Secondary | ICD-10-CM | POA: Diagnosis not present

## 2022-07-07 DIAGNOSIS — J90 Pleural effusion, not elsewhere classified: Secondary | ICD-10-CM | POA: Diagnosis not present

## 2022-07-07 DIAGNOSIS — R0603 Acute respiratory distress: Secondary | ICD-10-CM | POA: Diagnosis present

## 2022-07-07 LAB — CBC WITH DIFFERENTIAL/PLATELET
Abs Immature Granulocytes: 0.04 10*3/uL (ref 0.00–0.07)
Basophils Absolute: 0 10*3/uL (ref 0.0–0.1)
Basophils Relative: 0 %
Eosinophils Absolute: 0 10*3/uL (ref 0.0–0.5)
Eosinophils Relative: 0 %
HCT: 34.7 % — ABNORMAL LOW (ref 39.0–52.0)
Hemoglobin: 11.3 g/dL — ABNORMAL LOW (ref 13.0–17.0)
Immature Granulocytes: 1 %
Lymphocytes Relative: 7 %
Lymphs Abs: 0.5 10*3/uL — ABNORMAL LOW (ref 0.7–4.0)
MCH: 29.7 pg (ref 26.0–34.0)
MCHC: 32.6 g/dL (ref 30.0–36.0)
MCV: 91.3 fL (ref 80.0–100.0)
Monocytes Absolute: 0.4 10*3/uL (ref 0.1–1.0)
Monocytes Relative: 5 %
Neutro Abs: 6.7 10*3/uL (ref 1.7–7.7)
Neutrophils Relative %: 87 %
Platelets: 108 10*3/uL — ABNORMAL LOW (ref 150–400)
RBC: 3.8 MIL/uL — ABNORMAL LOW (ref 4.22–5.81)
RDW: 14.6 % (ref 11.5–15.5)
WBC: 7.7 10*3/uL (ref 4.0–10.5)
nRBC: 0 % (ref 0.0–0.2)

## 2022-07-07 LAB — COMPREHENSIVE METABOLIC PANEL
ALT: 22 U/L (ref 0–44)
AST: 29 U/L (ref 15–41)
Albumin: 3.5 g/dL (ref 3.5–5.0)
Alkaline Phosphatase: 129 U/L — ABNORMAL HIGH (ref 38–126)
Anion gap: 10 (ref 5–15)
BUN: 48 mg/dL — ABNORMAL HIGH (ref 8–23)
CO2: 19 mmol/L — ABNORMAL LOW (ref 22–32)
Calcium: 8.2 mg/dL — ABNORMAL LOW (ref 8.9–10.3)
Chloride: 95 mmol/L — ABNORMAL LOW (ref 98–111)
Creatinine, Ser: 2.56 mg/dL — ABNORMAL HIGH (ref 0.61–1.24)
GFR, Estimated: 23 mL/min — ABNORMAL LOW (ref >60)
Glucose, Bld: 132 mg/dL — ABNORMAL HIGH (ref 70–99)
Potassium: 4.9 mmol/L (ref 3.5–5.1)
Sodium: 124 mmol/L — ABNORMAL LOW (ref 135–145)
Total Bilirubin: 1.8 mg/dL — ABNORMAL HIGH (ref 0.3–1.2)
Total Protein: 6.3 g/dL — ABNORMAL LOW (ref 6.5–8.1)

## 2022-07-07 LAB — TROPONIN I (HIGH SENSITIVITY)
Troponin I (High Sensitivity): 33 ng/L — ABNORMAL HIGH (ref <18)
Troponin I (High Sensitivity): 35 ng/L — ABNORMAL HIGH (ref <18)

## 2022-07-07 LAB — BRAIN NATRIURETIC PEPTIDE: B Natriuretic Peptide: 1999.8 pg/mL — ABNORMAL HIGH (ref 0.0–100.0)

## 2022-07-07 MED ORDER — SODIUM CHLORIDE 0.9% FLUSH
3.0000 mL | Freq: Two times a day (BID) | INTRAVENOUS | Status: DC
  Administered 2022-07-07 – 2022-07-11 (×5): 3 mL via INTRAVENOUS

## 2022-07-07 MED ORDER — SODIUM CHLORIDE 0.9% FLUSH: 3.0000 mL | INTRAVENOUS | Status: DC | PRN

## 2022-07-07 MED ORDER — ATORVASTATIN CALCIUM 20 MG PO TABS
40.0000 mg | ORAL_TABLET | Freq: Every day | ORAL | Status: DC
  Administered 2022-07-08 – 2022-07-11 (×4): 40 mg via ORAL
  Filled 2022-07-07 (×4): qty 2

## 2022-07-07 MED ORDER — ISOSORBIDE MONONITRATE ER 60 MG PO TB24
120.0000 mg | ORAL_TABLET | Freq: Every day | ORAL | Status: DC
  Administered 2022-07-08 – 2022-07-11 (×3): 120 mg via ORAL
  Filled 2022-07-07 (×4): qty 2

## 2022-07-07 MED ORDER — TAMSULOSIN HCL 0.4 MG PO CAPS
0.4000 mg | ORAL_CAPSULE | Freq: Every day | ORAL | Status: DC
  Administered 2022-07-07 – 2022-07-10 (×4): 0.4 mg via ORAL
  Filled 2022-07-07 (×4): qty 1

## 2022-07-07 MED ORDER — RANOLAZINE ER 500 MG PO TB12
500.0000 mg | ORAL_TABLET | Freq: Two times a day (BID) | ORAL | Status: DC
  Administered 2022-07-07 – 2022-07-10 (×6): 500 mg via ORAL
  Filled 2022-07-07 (×6): qty 1

## 2022-07-07 MED ORDER — APIXABAN 2.5 MG PO TABS
2.5000 mg | ORAL_TABLET | Freq: Two times a day (BID) | ORAL | Status: DC
  Administered 2022-07-07 – 2022-07-11 (×8): 2.5 mg via ORAL
  Filled 2022-07-07 (×8): qty 1

## 2022-07-07 MED ORDER — HYDRALAZINE HCL 25 MG PO TABS
25.0000 mg | ORAL_TABLET | Freq: Two times a day (BID) | ORAL | Status: DC
  Administered 2022-07-07 – 2022-07-11 (×7): 25 mg via ORAL
  Filled 2022-07-07 (×8): qty 1

## 2022-07-07 MED ORDER — ASPIRIN 81 MG PO CHEW
81.0000 mg | CHEWABLE_TABLET | Freq: Every day | ORAL | Status: DC
  Administered 2022-07-08 – 2022-07-11 (×4): 81 mg via ORAL
  Filled 2022-07-07 (×4): qty 1

## 2022-07-07 MED ORDER — SODIUM CHLORIDE 0.9 % IV SOLN: 250.0000 mL | INTRAVENOUS | Status: DC | PRN

## 2022-07-07 MED ORDER — PANTOPRAZOLE SODIUM 40 MG PO TBEC
40.0000 mg | DELAYED_RELEASE_TABLET | Freq: Every day | ORAL | Status: DC
  Administered 2022-07-08 – 2022-07-11 (×4): 40 mg via ORAL
  Filled 2022-07-07 (×4): qty 1

## 2022-07-07 MED ORDER — FUROSEMIDE 10 MG/ML IJ SOLN
40.0000 mg | Freq: Once | INTRAMUSCULAR | Status: AC
  Administered 2022-07-07: 40 mg via INTRAVENOUS
  Filled 2022-07-07: qty 4

## 2022-07-07 MED ORDER — DORZOLAMIDE HCL-TIMOLOL MAL 2-0.5 % OP SOLN
1.0000 [drp] | Freq: Two times a day (BID) | OPHTHALMIC | Status: DC
  Administered 2022-07-07 – 2022-07-11 (×8): 1 [drp] via OPHTHALMIC
  Filled 2022-07-07: qty 10

## 2022-07-07 MED ORDER — LATANOPROST 0.005 % OP SOLN
1.0000 [drp] | Freq: Every day | OPHTHALMIC | Status: DC
  Administered 2022-07-07 – 2022-07-10 (×4): 1 [drp] via OPHTHALMIC
  Filled 2022-07-07: qty 2.5

## 2022-07-07 MED ORDER — METOPROLOL TARTRATE 25 MG PO TABS
25.0000 mg | ORAL_TABLET | Freq: Two times a day (BID) | ORAL | Status: DC
  Administered 2022-07-07 – 2022-07-11 (×7): 25 mg via ORAL
  Filled 2022-07-07 (×8): qty 1

## 2022-07-07 NOTE — ED Triage Notes (Signed)
Pt arrived from home via [facility] with c/o burning chest pain and SOB. Was here a month ago with an NSTEMI and pt states the burning pain hasn't gone away since then. Called EMS today because of the SOB, which was reported got worse when standing. Per EMS pt was cyanotic around the lips when they arrived, and with 2L [place] the coloring came back around pt's lips.

## 2022-07-07 NOTE — H&P (Addendum)
History and Physical    Austin [name] IOE:[id number] DOB: 31-Jul-1928 DOA: 07/07/2022  PCP: Eulas Post, MD  Patient coming from: home   Chief Complaint: dyspnea, fatigue  HPI: Austin [name] is a 89 y.o. male with medical history significant for paroxysmal a fib, htn, bph, cad, hfref, osa, sick sinus s/p pacer, ckd4, who presents with the above.  Recent admit for nstemi, treated medically.  Since discharge hasn't felt back to normal. Has had worsening fatigue, lack of energy. Also slowly worsening shortness of breath, mainly with exertion. No chest pain but gets occasional epigastric burning sensation. Also with intermittent nausea, no vomiting. Occasional hard stools mixed with loose/liquid stools. No fevers. Home nurse came by today and advised EMS given these symptoms. EMS placed him on 2 L after noting "cyanosis" on his lips.    Review of Systems: As per HPI otherwise 10 point review of systems negative.    Past Medical History:  Diagnosis Date   Absolute anemia 12/08/2014   Allergy    Anginal pain ([place])    much less angina d/t lack of activity   Arthritis    all over body   Atrial fibrillation ([place])    Bradycardia 05/03/2015   Calculus of kidney 12/08/2014   Coronary artery disease    GERD (gastroesophageal reflux disease)    Glaucoma    History of kidney stones    HLD (hyperlipidemia)    Hyperglycemia 08/27/2015   Hypertension    Macular degeneration    Myocardial infarction [facility]) 2011   Peripheral vascular disease ([place])    VHD (valvular heart disease)     Past Surgical History:  Procedure Laterality Date   ANTERIOR CERVICAL DECOMP/DISCECTOMY FUSION N/A 12/30/2017   Procedure: ANTERIOR CERVICAL DECOMPRESSION/DISCECTOMY FUSION 3 LEVELS- C3-6;  Surgeon: Meade Maw, MD;  Location: [facility] ORS;  Service: Neurosurgery;  Laterality: N/A;   CARDIAC CATHETERIZATION  2007   CARDIAC CATHETERIZATION N/A 04/17/2015   Procedure: Left Heart Cath and Coronary  Angiography;  Surgeon: Corey Skains, MD;  Location: [place] CV LAB;  Service: Cardiovascular;  Laterality: N/A;   CARPAL TUNNEL RELEASE Bilateral    right 06/09/08 left 05/03/09   CATARACT EXTRACTION W/ INTRAOCULAR LENS  IMPLANT, BILATERAL     COLONOSCOPY N/A 10/02/2014   Procedure: COLONOSCOPY;  Surgeon: Manya Silvas, MD;  Location: [facility] ENDOSCOPY;  Service: Endoscopy;  Laterality: N/A;   CORONARY ARTERY BYPASS GRAFT  2007   ESOPHAGOGASTRODUODENOSCOPY     EYE SURGERY     HEMORROIDECTOMY     JOINT REPLACEMENT Right 03/2017   partial knee replacement   KNEE ARTHROSCOPY Right 08/05/2016   Procedure: ARTHROSCOPY KNEE;  Surgeon: Thornton Park, MD;  Location: [facility] ORS;  Service: Orthopedics;  Laterality: Right;   KNEE ARTHROSCOPY WITH LATERAL RELEASE Right 08/05/2016   Procedure: KNEE ARTHROSCOPY WITH LATERAL RELEASE;  Surgeon: Thornton Park, MD;  Location: [facility] ORS;  Service: Orthopedics;  Laterality: Right;   KNEE ARTHROSCOPY WITH MEDIAL MENISECTOMY Right 08/05/2016   Procedure: KNEE ARTHROSCOPY WITH MEDIAL MENISECTOMY;  Surgeon: Thornton Park, MD;  Location: [facility] ORS;  Service: Orthopedics;  Laterality: Right;  Partial   LEFT HEART CATH AND CORONARY ANGIOGRAPHY Left 05/16/2021   Procedure: LEFT HEART CATH AND CORONARY ANGIOGRAPHY;  Surgeon: Corey Skains, MD;  Location: [place] [facility];  Service: Cardiovascular;  Laterality: Left;   LUMBAR EPIDURAL INJECTION  2011   LUMBAR LAMINECTOMY/DECOMPRESSION MICRODISCECTOMY N/A 03/10/2018   Procedure: LUMBAR LAMINECTOMY/DECOMPRESSION MICRODISCECTOMY 3 LEVELS-L2-5;  Surgeon: Izora Ribas,  Ardyth Gal, MD;  Location: [facility] ORS;  Service: Neurosurgery;  Laterality: N/A;   MOUTH SURGERY     tooth implant   PACEMAKER LEADLESS INSERTION N/A 08/27/2021   Procedure: PACEMAKER LEADLESS INSERTION;  Surgeon: Isaias Cowman, MD;  Location: [place] [facility];  Service: Cardiovascular;  Laterality: N/A;   SHOULDER SURGERY Right 2011   rotator cuff  repair   SYNOVECTOMY Right 08/05/2016   Procedure: SYNOVECTOMY;  Surgeon: Thornton Park, MD;  Location: [facility] ORS;  Service: Orthopedics;  Laterality: Right;  arthroscopic extensive synovectomy   TONSILLECTOMY       reports that he quit smoking about 44 years ago. His smoking use included cigarettes. He has never used smokeless tobacco. He reports current alcohol use of about 3.0 standard drinks of alcohol per week. He reports that he does not use drugs.  Allergies  Allergen Reactions   Penicillins Itching, Swelling and Rash    Family History  Problem Relation Age of Onset   Heart disease Mother    Heart disease Father    Heart disease Brother    Heart disease Brother    Heart disease Brother    Heart disease Brother    Heart disease Brother     Prior to Admission medications   Medication Sig Start Date End Date Taking? Authorizing Provider  ranolazine (RANEXA) 500 MG 12 hr tablet Take 1 tablet by mouth 2 (two) times daily. 06/18/22 06/18/23 Yes [provider]  apixaban (ELIQUIS) 2.5 MG TABS tablet Take 1 tablet (2.5 mg total) by mouth 2 (two) times daily. 07/15/19 06/12/22  Sidney Ace, MD  aspirin 81 MG chewable tablet Chew 1 tablet (81 mg total) by mouth daily. 06/16/22   Ezekiel Slocumb, DO  atorvastatin (LIPITOR) 40 MG tablet Take 1 tablet (40 mg total) by mouth daily. 06/15/22   Ezekiel Slocumb, DO  Biotin 5000 MCG CAPS Take 5,000 mcg by mouth daily.     [provider]  Cholecalciferol (VITAMIN D3) 2000 UNITS capsule Take 2,000 Units by mouth 2 (two) times daily.     [provider]  Coenzyme Q10 (COQ-10 PO) Take 200 mg by mouth daily.    [provider]  Cyanocobalamin (VITAMIN B-12) 5000 MCG SUBL Place 5,000 mcg under the tongue daily.    [provider]  dorzolamide-timolol (COSOPT) 22.3-6.8 MG/ML ophthalmic solution Place 1 drop into both eyes 2 (two) times daily.  07/03/15   [provider]  hydrALAZINE  (APRESOLINE) 25 MG tablet Take 1 tablet (25 mg total) by mouth 2 (two) times daily. 06/15/22   Ezekiel Slocumb, DO  isosorbide mononitrate (IMDUR) 120 MG 24 hr tablet Take 1 tablet (120 mg total) by mouth daily. 06/16/22   Ezekiel Slocumb, DO  latanoprost (XALATAN) 0.005 % ophthalmic solution Place 1 drop into both eyes at bedtime. 07/23/21   [provider]  Melatonin 5 MG CAPS Take 5 mg by mouth at bedtime as needed (for sleep).     [provider]  metoprolol tartrate (LOPRESSOR) 50 MG tablet Take 0.5 tablets (25 mg total) by mouth 2 (two) times daily. 08/28/21   Tang, Alanson Puls, PA-C  nitroGLYCERIN (NITROSTAT) 0.4 MG SL tablet Place 0.4 mg under the tongue every 5 (five) minutes as needed for chest pain.  02/25/11   [provider]  omeprazole (PRILOSEC) 20 MG capsule Take 20 mg by mouth daily.  07/28/15   [provider]  Polyethyl Glycol-Propyl Glycol (SYSTANE OP) Place 1 drop into  the right eye daily as needed (burning).    [provider]  ranolazine (RANEXA) 1000 MG SR tablet Take 1 tablet (1,000 mg total) by mouth 2 (two) times daily. 06/15/22   Nicole Kindred A, DO  tamsulosin (FLOMAX) 0.4 MG CAPS capsule TAKE 1 CAPSULE BY MOUTH ONCE DAILY 05/09/22   Thedore Mins, Ria Comment, PA-C  torsemide (DEMADEX) 20 MG tablet Take 10 mg by mouth daily. 12/02/19   [provider]  TURMERIC PO Take 1,000 mg by mouth daily.    [provider]    Physical Exam: Vitals:   07/07/22 1234 07/07/22 1240 07/07/22 1330  BP: 130/77  (!) 105/59  Pulse: 65  65  Resp: 15  15  TempSrc: Axillary    SpO2: 99%  98%  Weight:  68 kg   Height:  '5\' 6"'$  (1.676 m)     Constitutional: No acute distress Head: Atraumatic Eyes: Conjunctiva clear ENM: Moist mucous membranes. Normal dentition.  Neck: Supple Respiratory: rales at bases Cardiovascular: Regular rate and rhythm. Distant heart sounds. Soft systolic murmur Abdomen: Non-tender, non-distended. No masses.  No rebound or guarding. Positive bowel sounds. Musculoskeletal: No joint deformity upper and lower extremities. Normal ROM, no contractures. Skin: No rashes, lesions, or ulcers.  Extremities: mod edema to knees Neurologic: Alert, moving all 4 extremities. Psychiatric: Normal insight and judgement.   Labs on Admission: I have personally reviewed following labs and imaging studies  CBC: Recent Labs  Lab 07/07/22 1231  WBC 7.7  NEUTROABS 6.7  HGB 11.3*  HCT 34.7*  MCV 91.3  PLT 160*   Basic Metabolic Panel: Recent Labs  Lab 07/07/22 1231  NA 124*  K 4.9  CL 95*  CO2 19*  GLUCOSE 132*  BUN 48*  CREATININE 2.56*  CALCIUM 8.2*   GFR: Estimated Creatinine Clearance: 17 mL/min (A) (by C-G formula based on SCr of 2.56 mg/dL (H)). Liver Function Tests: Recent Labs  Lab 07/07/22 1231  AST 29  ALT 22  ALKPHOS 129*  BILITOT 1.8*  PROT 6.3*  ALBUMIN 3.5   No results for input(s): "LIPASE", "AMYLASE" in the last 168 hours. No results for input(s): "AMMONIA" in the last 168 hours. Coagulation Profile: No results for input(s): "INR", "PROTIME" in the last 168 hours. Cardiac Enzymes: No results for input(s): "CKTOTAL", "CKMB", "CKMBINDEX", "TROPONINI" in the last 168 hours. BNP (last 3 results) No results for input(s): "PROBNP" in the last 8760 hours. HbA1C: No results for input(s): "HGBA1C" in the last 72 hours. CBG: No results for input(s): "GLUCAP" in the last 168 hours. Lipid Profile: No results for input(s): "CHOL", "HDL", "LDLCALC", "TRIG", "CHOLHDL", "LDLDIRECT" in the last 72 hours. Thyroid Function Tests: No results for input(s): "TSH", "T4TOTAL", "FREET4", "T3FREE", "THYROIDAB" in the last 72 hours. Anemia Panel: No results for input(s): "VITAMINB12", "FOLATE", "FERRITIN", "TIBC", "IRON", "RETICCTPCT" in the last 72 hours. Urine analysis:    Component Value Date/Time   COLORURINE YELLOW (A) 07/13/2019 1406   APPEARANCEUR CLEAR (A) 07/13/2019 1406    LABSPEC 1.010 07/13/2019 1406   PHURINE 5.0 07/13/2019 1406   GLUCOSEU NEGATIVE 07/13/2019 1406   [place] 07/13/2019 1406   [place] 07/13/2019 1406   [place] 07/13/2019 1406   PROTEINUR NEGATIVE 07/13/2019 1406   NITRITE NEGATIVE 07/13/2019 1406   LEUKOCYTESUR NEGATIVE 07/13/2019 1406    Radiological Exams on Admission: DG Chest Portable 1 View  Result Date: 07/07/2022 CLINICAL DATA:  Chest pain EXAM: PORTABLE CHEST - 1 VIEW COMPARISON:  06/12/2022 FINDINGS: Heart is mildly  enlarged. Mild pulmonary vascular congestion. Postsurgical changes of CABG and ACDF are seen. Interval development of patchy bibasilar airspace opacities and minimal pleural effusions. Degenerative changes of the shoulders again seen. Surgical anchors noted in the right humeral head. IMPRESSION: Interval development of mild cardiomegaly, pulmonary vascular congestion, bibasilar opacities and small pleural effusions, consistent with CHF. Electronically Signed   By: Miachel Roux M.D.   On: 07/07/2022 12:55    EKG: Independently reviewed. V-paced  Assessment/Plan Principal Problem:   CHF exacerbation (HCC) Active Problems:   Chronic pain syndrome   Benign essential HTN   Cardiomyopathy, ischemic   Atrial fibrillation (HCC)   Benign prostatic hyperplasia   Obstructive sleep apnea syndrome   Sick sinus syndrome (HCC)   CKD (chronic kidney disease) stage 4, GFR 15-29 ml/min (HCC)   Chronic combined systolic and diastolic CHF (congestive heart failure) (HCC)   HFrEF (heart failure with reduced ejection fraction) ([place])   # HFrEF with exacerbation # Ischemic cardiomyopathy 06/13/22 TTE with EF 52-77, g3 diastolic dysfunction, moderately reduced RF systolic function. Here with dyspnea, hypoxia, lower extremity edema, and CXR findings compatible with CHF, with bnp elevated to 2000, was 1000 3 wks ago. No chest pain and troponin elevation is minimal, do not think ACS. - lasix 40 IV given in the  ED, will assess response and go from there. Holding home torsemide 10 - strict I/os - cont home metop  # Hyponatremia Sodium 124 from baseline of around 130, was 127 one week ago. Given signs volume overload suspect this is secondary to that - lasix as above  # Acute hypoxic respiratory failure Mid 80s on room air, normalized on 2 L - [place] O2, wean as able  # End-of-life care Discussed goals of care today. Patient/daughter confirmed he is DNR/DNI. They are interested in home hospice, will plan on that referral prior to d/c. For now quality of life is adequate, wants to continue to treat the treatable.  # CKD 4 GFR is at baseline - monitor  # CAD No chest pain or sig troponin elevation to suggest ACS. Recent nstemi. Hx cabg in 2007, had 3-vessel disease on LHC in 2022 not amenable to intervention - cont aspirin, statin, ranexa  # a-fib, paroxysmal Rate controlled - cont metop, apixaban  # Nausea # Elevated t bili # Constipation? - Will check ruq u/s, KUB - bowel regimen  # BPH - home flomax  # Sick sinus # Pacemaker status EKG shows paced rhythm  DVT prophylaxis: apixaban Code Status: DNR/DNI  Family Communication: daughter updated @ bedside   Consults called: none   Level of care: Telemetry Cardiac Status is: inpt     Desma Maxim MD [facility] Pager [phone]  If 7PM-7AM, please contact night-coverage [email or web address] Password [facility]  07/07/2022, 3:39 PM

## 2022-07-07 NOTE — ED Notes (Signed)
Admitting provider at bedside.

## 2022-07-07 NOTE — ED Notes (Signed)
Pt stated that the head of his penis is swollen causing him to have difficulty pulling his foreskin back to start urinating. Pt said he noticed this swelling a couple of days ago.

## 2022-07-07 NOTE — ED Provider Notes (Signed)
[facility] Provider Note    Event Date/Time   First MD Initiated Contact with Patient 07/07/22 1231     (approximate)   History   Chief Complaint Chest Pain and Shortness of Breath   HPI  Austin [name] is a 89 y.o. male with past medical history of hypertension, hyperlipidemia, CAD, CKD, sick sinus syndrome status post pacemaker, and atrial fibrillation on Eliquis who presents to the ED complaining of chest pain and shortness of breath.  Patient reports that he has been dealing with intermittent "burning" in the center of his chest for about the past month, which she states has been worse with exertion.  He now reports increasing difficulty breathing over the past 2 days, denies any fevers or cough.  He states that the pain in his chest has seem to worsen with the difficulty breathing, has also noticed increasing swelling in his legs.  He states he has been compliant with his diuretic medication and has been urinating normally.  He denies any abdominal pain, nausea, vomiting, or diarrhea.  EMS reports patient was cyanotic around the lips when they arrived, was placed on 2 L nasal cannula but noted to have normal oxygen saturation.     Physical Exam   Triage Vital Signs: ED Triage Vitals  Enc Vitals Group     BP      Pulse      Resp      Temp      Temp src      SpO2      Weight      Height      Head Circumference      Peak Flow      Pain Score      Pain Loc      Pain Edu?      Excl. in [place]?     Most recent vital signs: Vitals:   07/07/22 1234 07/07/22 1330  BP: 130/77 (!) 105/59  Pulse: 65 65  Resp: 15 15  SpO2: 99% 98%    Constitutional: Alert and oriented.  Pale appearing. Eyes: Conjunctivae are normal. Head: Atraumatic. Nose: No congestion/rhinnorhea. Mouth/Throat: Mucous membranes are moist.  Cardiovascular: Normal rate, regular rhythm. Grossly normal heart sounds.  2+ radial pulses bilaterally. Respiratory: Normal respiratory  effort.  No retractions. Lungs CTAB. Gastrointestinal: Soft and nontender. No distention. Musculoskeletal: No lower extremity tenderness nor edema.  Neurologic:  Normal speech and language. No gross focal neurologic deficits are appreciated.    ED Results / Procedures / Treatments   Labs (all labs ordered are listed, but only abnormal results are displayed) Labs Reviewed  CBC WITH DIFFERENTIAL/PLATELET - Abnormal; Notable for the following components:      Result Value   RBC 3.80 (*)    Hemoglobin 11.3 (*)    HCT 34.7 (*)    Platelets 108 (*)    Lymphs Abs 0.5 (*)    All other components within normal limits  COMPREHENSIVE METABOLIC PANEL - Abnormal; Notable for the following components:   Sodium 124 (*)    Chloride 95 (*)    CO2 19 (*)    Glucose, Bld 132 (*)    BUN 48 (*)    Creatinine, Ser 2.56 (*)    Calcium 8.2 (*)    Total Protein 6.3 (*)    Alkaline Phosphatase 129 (*)    Total Bilirubin 1.8 (*)    GFR, Estimated 23 (*)    All other components within normal limits  BRAIN NATRIURETIC PEPTIDE - Abnormal; Notable for the following components:   B Natriuretic Peptide 1,999.8 (*)    All other components within normal limits  TROPONIN I (HIGH SENSITIVITY) - Abnormal; Notable for the following components:   Troponin I (High Sensitivity) 33 (*)    All other components within normal limits  TROPONIN I (HIGH SENSITIVITY)     EKG  ED ECG REPORT I, Blake Divine, the attending physician, personally viewed and interpreted this ECG.   Date: 07/07/2022  EKG Time: 12:30  Rate: 64  Rhythm: Ventricular paced rhythm  Axis: RAD  Intervals:nonspecific intraventricular conduction delay  ST&T Change: None  RADIOLOGY Chest x-ray reviewed and interpreted by me with pulmonary edema noted, no focal infiltrate or effusion noted.  PROCEDURES:  Critical Care performed: Yes, see critical care procedure note(s)  .Critical Care  Performed by: Blake Divine, MD Authorized  by: Blake Divine, MD   Critical care provider statement:    Critical care time (minutes):  30   Critical care time was exclusive of:  Separately billable procedures and treating other patients and teaching time   Critical care was necessary to treat or prevent imminent or life-threatening deterioration of the following conditions:  Metabolic crisis   Critical care was time spent personally by me on the following activities:  Development of treatment plan with patient or surrogate, discussions with consultants, evaluation of patient's response to treatment, examination of patient, ordering and review of laboratory studies, ordering and review of radiographic studies, ordering and performing treatments and interventions, pulse oximetry, re-evaluation of patient's condition and review of old charts   I assumed direction of critical care for this patient from another provider in my specialty: no     Care discussed with: admitting provider      MEDICATIONS ORDERED IN ED: Medications  furosemide (LASIX) injection 40 mg (has no administration in time range)     IMPRESSION / MDM / [place] / ED COURSE  I reviewed the triage vital signs and the nursing notes.                              89 y.o. male with past medical history of hypertension, hyperlipidemia, CAD, atrial fibrillation on Eliquis, sick sinus syndrome status post pacemaker, and CKD who presents to the ED complaining of ongoing burning pain in his chest with recent difficulty breathing.  Patient's presentation is most consistent with acute presentation with potential threat to life or bodily function.  Differential diagnosis includes, but is not limited to, ACS, PE, dissection, pneumonia, pneumothorax, CHF exacerbation, COPD, musculoskeletal pain, GERD, anxiety.  Patient nontoxic-appearing and in no acute distress, vital signs are unremarkable.  Patient had been placed on 2 L nasal cannula by EMS for comfort, but is not  in any respiratory distress and maintaining oxygen saturations on room air currently.  EKG shows ventricular paced rhythm, patient recently admitted for NSTEMI but catheterization was deferred due to his advanced chronic kidney disease.  He is high risk for another NSTEMI, but denies ongoing chest pain at this time.  We will further assess with labs including troponin as well as chest x-ray.   Chest x-ray concerning for CHF with pulmonary edema but no focal infiltrate, BNP is elevated consistent with this.  Additional labs show troponin downtrending from patient's recent admission, low suspicion for ACS at this time.  Renal function stable compared to previous but sodium is downtrending, patient would benefit  from admission for diuresis and management of hyponatremia.  Case discussed with hospitalist.      FINAL CLINICAL IMPRESSION(S) / ED DIAGNOSES   Final diagnoses:  Acute on chronic congestive heart failure, unspecified heart failure type ([place])  Hyponatremia  Chest pain, unspecified type     Rx / DC Orders   ED Discharge Orders     None        Note:  This document was prepared using Dragon voice recognition software and may include unintentional dictation errors.   Blake Divine, MD 07/07/22 [phone]

## 2022-07-08 ENCOUNTER — Encounter (INDEPENDENT_AMBULATORY_CARE_PROVIDER_SITE_OTHER): Payer: PPO

## 2022-07-08 ENCOUNTER — Ambulatory Visit (INDEPENDENT_AMBULATORY_CARE_PROVIDER_SITE_OTHER): Payer: PPO | Admitting: Vascular Surgery

## 2022-07-08 DIAGNOSIS — N179 Acute kidney failure, unspecified: Secondary | ICD-10-CM

## 2022-07-08 DIAGNOSIS — N401 Enlarged prostate with lower urinary tract symptoms: Secondary | ICD-10-CM

## 2022-07-08 DIAGNOSIS — I10 Essential (primary) hypertension: Secondary | ICD-10-CM

## 2022-07-08 DIAGNOSIS — E872 Acidosis, unspecified: Secondary | ICD-10-CM

## 2022-07-08 DIAGNOSIS — I255 Ischemic cardiomyopathy: Secondary | ICD-10-CM | POA: Diagnosis not present

## 2022-07-08 DIAGNOSIS — N189 Chronic kidney disease, unspecified: Secondary | ICD-10-CM

## 2022-07-08 DIAGNOSIS — I5023 Acute on chronic systolic (congestive) heart failure: Secondary | ICD-10-CM | POA: Diagnosis not present

## 2022-07-08 DIAGNOSIS — Z862 Personal history of diseases of the blood and blood-forming organs and certain disorders involving the immune mechanism: Secondary | ICD-10-CM

## 2022-07-08 DIAGNOSIS — E876 Hypokalemia: Secondary | ICD-10-CM | POA: Insufficient documentation

## 2022-07-08 DIAGNOSIS — I480 Paroxysmal atrial fibrillation: Secondary | ICD-10-CM

## 2022-07-08 DIAGNOSIS — E871 Hypo-osmolality and hyponatremia: Secondary | ICD-10-CM | POA: Insufficient documentation

## 2022-07-08 DIAGNOSIS — R350 Frequency of micturition: Secondary | ICD-10-CM

## 2022-07-08 LAB — CBC
HCT: 30.3 % — ABNORMAL LOW (ref 39.0–52.0)
Hemoglobin: 10 g/dL — ABNORMAL LOW (ref 13.0–17.0)
MCH: 30 pg (ref 26.0–34.0)
MCHC: 33 g/dL (ref 30.0–36.0)
MCV: 91 fL (ref 80.0–100.0)
Platelets: 96 10*3/uL — ABNORMAL LOW (ref 150–400)
RBC: 3.33 MIL/uL — ABNORMAL LOW (ref 4.22–5.81)
RDW: 14.6 % (ref 11.5–15.5)
WBC: 7.1 10*3/uL (ref 4.0–10.5)
nRBC: 0 % (ref 0.0–0.2)

## 2022-07-08 LAB — BASIC METABOLIC PANEL
Anion gap: 9 (ref 5–15)
BUN: 40 mg/dL — ABNORMAL HIGH (ref 8–23)
CO2: 16 mmol/L — ABNORMAL LOW (ref 22–32)
Calcium: 6.6 mg/dL — ABNORMAL LOW (ref 8.9–10.3)
Chloride: 104 mmol/L (ref 98–111)
Creatinine, Ser: 2.03 mg/dL — ABNORMAL HIGH (ref 0.61–1.24)
GFR, Estimated: 30 mL/min — ABNORMAL LOW (ref >60)
Glucose, Bld: 82 mg/dL (ref 70–99)
Potassium: 3.2 mmol/L — ABNORMAL LOW (ref 3.5–5.1)
Sodium: 129 mmol/L — ABNORMAL LOW (ref 135–145)

## 2022-07-08 MED ORDER — MAGNESIUM SULFATE 2 GM/50ML IV SOLN
2.0000 g | Freq: Once | INTRAVENOUS | Status: AC
  Administered 2022-07-08: 2 g via INTRAVENOUS
  Filled 2022-07-08: qty 50

## 2022-07-08 MED ORDER — POTASSIUM CHLORIDE CRYS ER 20 MEQ PO TBCR
20.0000 meq | EXTENDED_RELEASE_TABLET | Freq: Two times a day (BID) | ORAL | Status: DC
  Administered 2022-07-08 – 2022-07-10 (×4): 20 meq via ORAL
  Filled 2022-07-08 (×4): qty 1

## 2022-07-08 MED ORDER — SODIUM BICARBONATE 650 MG PO TABS
1300.0000 mg | ORAL_TABLET | Freq: Two times a day (BID) | ORAL | Status: DC
  Administered 2022-07-08 – 2022-07-11 (×6): 1300 mg via ORAL
  Filled 2022-07-08 (×6): qty 2

## 2022-07-08 MED ORDER — FUROSEMIDE 10 MG/ML IJ SOLN
40.0000 mg | Freq: Two times a day (BID) | INTRAMUSCULAR | Status: DC
  Administered 2022-07-08 (×2): 40 mg via INTRAVENOUS
  Filled 2022-07-08 (×2): qty 4

## 2022-07-08 MED ORDER — POTASSIUM CHLORIDE CRYS ER 20 MEQ PO TBCR
40.0000 meq | EXTENDED_RELEASE_TABLET | Freq: Once | ORAL | Status: AC
  Administered 2022-07-08: 40 meq via ORAL
  Filled 2022-07-08: qty 2

## 2022-07-08 NOTE — Evaluation (Signed)
Physical Therapy Evaluation Patient Details Name: Austin [name] MRN: [id number] DOB: 07-08-1928 Today's Date: 07/08/2022  History of Present Illness  Austin [name] is a 89 y.o. male with medical history significant for paroxysmal a fib, htn, bph, cad, hfref, osa, sick sinus s/p pacer, ckd4, who presents with the above.     Recent admit for nstemi, treated medically. Since discharge hasn't felt back to normal. Has had worsening fatigue, lack of energy. Also slowly worsening shortness of breath, mainly with exertion. No chest pain but gets occasional epigastric burning sensation. Also with intermittent nausea, no vomiting. Occasional hard stools mixed with loose/liquid stools. No fevers. Home nurse came by today and advised EMS given these symptoms. EMS placed him on 2 L after noting "cyanosis" on his lips.   Clinical Impression  Pt admitted with above diagnosis. Pt received upright in bed using urinal. Wife present in room. Pt agreeable to PT eval after urination complete. At baseline pt is typically independent but has felt weaker over the last month requiring SPC to RW use as pt has medically declined.   To date pt resting on 3L/min maintaining > 90% on SPO2 at rest and with mobility. Pt is reliant on minA for bed mobility to sit EOB and to stand at RW. Pt generally unsteady and weak needing min VC's for RW proximity to improve stability with good carryover. Pt does present with mild SOB ambulating to recliner but is at 93% on 3 L/min post mobility. Pt and spouse discussion on GOC with regards to PT outcomes. Per EMR and spouse, discussion on possible home with hospice. Will maintain on PT caseload as pt would like to return to PLOF if possible. Pt and spouse agreeable to flexible POC. Pending GOC and medical status, will communicate daily with medical team for DME needs or if f/u therapy is necessary. Pt upright in recliner with all needs in reach. Pt currently with functional limitations due to the  deficits listed below (see PT Problem List). Pt will benefit from skilled PT to increase their independence and safety with mobility to allow discharge to the venue listed below.      Recommendations for follow up therapy are one component of a multi-disciplinary discharge planning process, led by the attending physician.  Recommendations may be updated based on patient status, additional functional criteria and insurance authorization.  Follow Up Recommendations Follow physician's recommendations for discharge plan and follow up therapies      Assistance Recommended at Discharge Intermittent Supervision/Assistance  Patient can return home with the following  A little help with walking and/or transfers;A little help with bathing/dressing/bathroom;Assistance with cooking/housework;Assist for transportation;Help with stairs or ramp for entrance    Equipment Recommendations None recommended by PT  Recommendations for Other Services       Functional Status Assessment Patient has had a recent decline in their functional status and demonstrates the ability to make significant improvements in function in a reasonable and predictable amount of time.     Precautions / Restrictions Precautions Precautions: Fall Restrictions Weight Bearing Restrictions: No      Mobility  Bed Mobility Overal bed mobility: Needs Assistance Bed Mobility: Supine to Sit     Supine to sit: HOB elevated, Min assist       Patient Response: Cooperative  Transfers Overall transfer level: Needs assistance Equipment used: Rolling walker (2 wheels) Transfers: Sit to/from Stand Sit to Stand: Min assist  Ambulation/Gait Ambulation/Gait assistance: Min guard Gait Distance (Feet): 20 Feet Assistive device: Rolling walker (2 wheels) Gait Pattern/deviations: Step-through pattern, Decreased step length - right, Decreased step length - left, Trunk flexed       General Gait Details: Mild  unsteadiness using RW reliant on mod VC's to stay closer to BOS of RW.  Stairs            Wheelchair Mobility    Modified Rankin (Stroke Patients Only)       Balance Overall balance assessment: Needs assistance Sitting-balance support: Bilateral upper extremity supported, Feet supported Sitting balance-Leahy Scale: Fair     Standing balance support: Single extremity supported, No upper extremity supported Standing balance-Leahy Scale: Fair                               Pertinent Vitals/Pain Pain Assessment Pain Assessment: No/denies pain    Home Living Family/patient expects to be discharged to:: Private residence Living Arrangements: Spouse/significant other Available Help at Discharge: Family;Available 24 hours/day Type of Home: House Home Access: Stairs to enter Entrance Stairs-Rails: None;Left;Right (no rails with single step, B rails at front.) Entrance Stairs-Number of Steps: 1 from garage, 3 from front door   Home Layout: One level Home Equipment: Conservation officer, nature (2 wheels);Cane - single point      Prior Function Prior Level of Function : Independent/Modified Independent             Mobility Comments: Over the last few weeks pt has been reliant on [facility] then RW.       Hand Dominance        Extremity/Trunk Assessment   Upper Extremity Assessment Upper Extremity Assessment: Defer to OT evaluation    Lower Extremity Assessment Lower Extremity Assessment: Generalized weakness    Cervical / Trunk Assessment Cervical / Trunk Assessment: Normal  Communication   Communication: HOH  Cognition Arousal/Alertness: Awake/alert Behavior During Therapy: WFL for tasks assessed/performed Overall Cognitive Status: Within Functional Limits for tasks assessed                                 General Comments: Appears emotionally down from current medical situation.        General Comments General comments (skin integrity,  edema, etc.): SPO2 at 93% on 3L/min post mobility.    Exercises Other Exercises Other Exercises: Role of PT in acute setting, d/c recs, safe use of AD   Assessment/Plan    PT Assessment Patient needs continued PT services  PT Problem List Decreased strength;Decreased activity tolerance;Decreased mobility;Decreased balance       PT Treatment Interventions DME instruction;Therapeutic exercise;Gait training;Balance training;Stair training;Neuromuscular re-education;Functional mobility training;Therapeutic activities;Patient/family education    PT Goals (Current goals can be found in the Care Plan section)  Acute Rehab PT Goals Patient Stated Goal: improve mobility if medically possible PT Goal Formulation: With patient Time For Goal Achievement: 07/22/22 Potential to Achieve Goals: Good    Frequency Min 2X/week     Co-evaluation               AM-PAC PT "6 Clicks" Mobility  Outcome Measure Help needed turning from your back to your side while in a flat bed without using bedrails?: A Little Help needed moving from lying on your back to sitting on the side of a flat bed without using bedrails?: A Little Help needed moving to and from  a bed to a chair (including a wheelchair)?: A Little Help needed standing up from a chair using your arms (e.g., wheelchair or bedside chair)?: A Little Help needed to walk in hospital room?: A Little Help needed climbing 3-5 steps with a railing? : A Lot 6 Click Score: 17    End of Session Equipment Utilized During Treatment: Gait belt;Oxygen Activity Tolerance: Patient tolerated treatment well Patient left: in chair;with call bell/phone within reach;with chair alarm set;with family/visitor present Nurse Communication: Mobility status PT Visit Diagnosis: Unsteadiness on feet (R26.81);Other abnormalities of gait and mobility (R26.89);Muscle weakness (generalized) (M62.81);Difficulty in walking, not elsewhere classified (R26.2)    Time:  1959-7471 PT Time Calculation (min) (ACUTE ONLY): 29 min   Charges:     PT Treatments $Therapeutic Activity: 8-22 mins       Jennipher Weatherholtz M. Fairly IV, PT, DPT Physical Therapist- [facility]  07/08/2022, 2:50 PM

## 2022-07-08 NOTE — Assessment & Plan Note (Signed)
Medical management last time with NSTEMI.  Currently on aspirin metoprolol and atorvastatin, Imdur and Ranexa.

## 2022-07-08 NOTE — Assessment & Plan Note (Signed)
From chronic kidney disease.  Start sodium bicarb tablets twice daily.

## 2022-07-08 NOTE — ED Notes (Signed)
Repositioned in bed.

## 2022-07-08 NOTE — Assessment & Plan Note (Signed)
Paroxysmal in nature on metoprolol and Eliquis.

## 2022-07-08 NOTE — Assessment & Plan Note (Signed)
Check a ferritin tomorrow.  Last hemoglobin 10.

## 2022-07-08 NOTE — Evaluation (Signed)
Occupational Therapy Evaluation Patient Details Name: Austin [name] MRN: [id number] DOB: 12-12-1928 Today's Date: 07/08/2022   History of Present Illness Austin [name] is a 89 y.o. male with medical history significant for paroxysmal a fib, htn, bph, cad, hfref, osa, sick sinus s/p pacer, ckd4, who presents with the above.     Recent admit for nstemi, treated medically. Since discharge hasn't felt back to normal. Has had worsening fatigue, lack of energy. Also slowly worsening shortness of breath, mainly with exertion. No chest pain but gets occasional epigastric burning sensation. Also with intermittent nausea, no vomiting. Occasional hard stools mixed with loose/liquid stools. No fevers. Home nurse came by today and advised EMS given these symptoms. EMS placed him on 2 L after noting "cyanosis" on his lips.   Clinical Impression   Patient seen for OT evaluation, spouse and daughter present. Pt presenting with decreased independence in self care, balance, functional mobility/transfers, and endurance. PTA pt lived with spouse, was Mod I for ADLs, received assistance PRN for IADLs, and Mod I for functional mobility using SPC or RW. Pt currently functioning at [place] A for sit<>stand, Min guard for functional mobility to the bathroom using RW, Min A for toilet transfer, and supervision for peri care in sitting. Pt on 3L O2 via [place] t/o and VSS. Per discussion with family, they are considering taking pt home with hospice. Will keep pt on caseload to facilitate return to PLOF, prevent deconditioning, and reduce caregiver burden. Will update D/C recommendations as necessary pending GOC discussion and pt's medical status. Pt will benefit from acute OT to increase overall independence in the areas of ADLs and functional mobility in order to safely discharge to next venue of care.    Recommendations for follow up therapy are one component of a multi-disciplinary discharge planning process, led by the attending  physician.  Recommendations may be updated based on patient status, additional functional criteria and insurance authorization.   Follow Up Recommendations  Follow physician's recommendations for discharge plan and follow up therapies     Assistance Recommended at Discharge Intermittent Supervision/Assistance  Patient can return home with the following A little help with walking and/or transfers;A little help with bathing/dressing/bathroom;Assistance with cooking/housework;Assist for transportation;Help with stairs or ramp for entrance    Functional Status Assessment  Patient has had a recent decline in their functional status and demonstrates the ability to make significant improvements in function in a reasonable and predictable amount of time.  Equipment Recommendations  None recommended by OT    Recommendations for Other Services       Precautions / Restrictions Precautions Precautions: Fall Restrictions Weight Bearing Restrictions: No      Mobility Bed Mobility               General bed mobility comments: NT, pt received/left in recliner    Transfers Overall transfer level: Needs assistance Equipment used: Rolling walker (2 wheels) Transfers: Sit to/from Stand Sit to Stand: Min assist (from recliner and toilet)                  Balance Overall balance assessment: Needs assistance Sitting-balance support: Bilateral upper extremity supported, Feet supported Sitting balance-Leahy Scale: Good     Standing balance support: Single extremity supported, No upper extremity supported Standing balance-Leahy Scale: Fair                             ADL either performed or assessed with  clinical judgement   ADL Overall ADL's : Needs assistance/impaired     Grooming: Min guard;Standing           Upper Body Dressing : Sitting;Set up   Lower Body Dressing: Minimal assistance;Sitting/lateral leans Lower Body Dressing Details (indicate cue type  and reason): anticipate for socks/shoes Toilet Transfer: Minimal assistance;BSC/3in1;Rolling walker (2 wheels);Grab bars;Ambulation Toilet Transfer Details (indicate cue type and reason): BSC frame placed over toilet Toileting- Clothing Manipulation and Hygiene: Sitting/lateral lean;Supervision/safety Toileting - Clothing Manipulation Details (indicate cue type and reason): peri care in sitting with supervision     Functional mobility during ADLs: Min guard;Rolling walker (2 wheels) (~20 ft to the bathroom)       Vision Baseline Vision/History: 1 Wears glasses Patient Visual Report: No change from baseline       Perception     Praxis      Pertinent Vitals/Pain Pain Assessment Pain Assessment: No/denies pain     Hand Dominance Right   Extremity/Trunk Assessment Upper Extremity Assessment Upper Extremity Assessment: Generalized weakness   Lower Extremity Assessment Lower Extremity Assessment: Generalized weakness   Cervical / Trunk Assessment Cervical / Trunk Assessment: Normal   Communication Communication Communication: HOH   Cognition Arousal/Alertness: Awake/alert Behavior During Therapy: WFL for tasks assessed/performed Overall Cognitive Status: Within Functional Limits for tasks assessed                                       General Comments  Pt on 3L O2 via [place] t/o. Pt endorsing feeling dizzy/lighthead while sitting on toilet. BP: 121/66 (83), HR 64, SpO2 >90%. Symptoms improved with time. VC provided for pursed lip breathing    Exercises Other Exercises Other Exercises: OT provided education re: role of OT, OT POC, post acute recs, sitting up for all meals, EOB/OOB mobility with assistance, home/fall safety.     Shoulder Instructions      Home Living Family/patient expects to be discharged to:: Private residence Living Arrangements: Spouse/significant other Available Help at Discharge: Family;Available 24 hours/day Type of Home:  House Home Access: Stairs to enter [employer] of Steps: 1 from garage, 3 from front door Entrance Stairs-Rails: None;Left;Right (no rails with single step, B rails at front.) Home Layout: One level     Bathroom Shower/Tub: Occupational psychologist: Handicapped height     Home Equipment: Conservation officer, nature (2 wheels);Cane - single point;Shower seat;Adaptive equipment Adaptive Equipment: Reacher;Sock aid;Long-handled shoe horn        Prior Functioning/Environment Prior Level of Function : Independent/Modified Independent             Mobility Comments: Over the last few weeks pt has been reliant on SPC then RW. ADLs Comments: Mod I for ADLs (uses AE for LB dressing, shower seat for bathing). Daughter provides transportation.        OT Problem List: Decreased strength;Decreased activity tolerance;Impaired balance (sitting and/or standing);Cardiopulmonary status limiting activity      OT Treatment/Interventions: Self-care/ADL training;Therapeutic exercise;Therapeutic activities;Energy conservation;DME and/or AE instruction;Patient/family education;Balance training    OT Goals(Current goals can be found in the care plan section) Acute Rehab OT Goals Patient Stated Goal: improve function/independence OT Goal Formulation: With patient/family Time For Goal Achievement: 07/22/22 Potential to Achieve Goals: Fair   OT Frequency: Min 2X/week    Co-evaluation              AM-PAC OT "6 Clicks" Daily  Activity     Outcome Measure Help from another person eating meals?: None Help from another person taking care of personal grooming?: A Little Help from another person toileting, which includes using toliet, bedpan, or urinal?: A Little Help from another person bathing (including washing, rinsing, drying)?: A Lot Help from another person to put on and taking off regular upper body clothing?: A Little Help from another person to put on and taking off regular lower  body clothing?: A Little 6 Click Score: 18   End of Session Equipment Utilized During Treatment: Gait belt;Rolling walker (2 wheels);Oxygen Nurse Communication: Mobility status  Activity Tolerance: Patient tolerated treatment well;Patient limited by fatigue Patient left: in chair;with call bell/phone within reach;with chair alarm set;with family/visitor present  OT Visit Diagnosis: Unsteadiness on feet (R26.81);Muscle weakness (generalized) (M62.81)                Time: 7353-2992 OT Time Calculation (min): 29 min Charges:  OT General Charges $OT Visit: 1 Visit OT Evaluation $OT Eval Moderate Complexity: 1 Mod  [facility] MS, OTR/L ascom [phone]  07/08/22, 5:19 PM

## 2022-07-08 NOTE — Consult Note (Signed)
[place] NOTE       Patient ID: Austin [name] MRN: [id number] DOB/AGE: 11-17-1928 89 y.o.  Admit date: 07/07/2022 Referring Physician Dr. Leslye Peer Primary Physician Dr. Miguel Aschoff Primary Cardiologist Dr. Nehemiah Massed  Reason for Consultation AoCHF  HPI: Austin [name] is a 89yoM with a PMH of severe 3vCAD s/p CABG x 3 (hx NSTEMI 05/2021 medically managed), SSS s/p Micra Leadless PPM (07/2021), paroxysmal AF on eliquis, HFrEF (25-30%, g3DD, global hypo 06/2022), mild aortic stenosis, CKD 4, HLD who presented to [facility] ED 07/07/2022 with shortness of breath and fatigue.  BNP elevated to 2000 on admission, cardiology is consulted for assistance with CHF.  He is known to our service from a recent admission last month where the patient had chest pain, peak troponins at 1400 and inferior ST depressions, consistent with NSTEMI.  Echo revealed a new worsening ejection fraction at 25-30%, decreased from previous at 55%.  The patient preferred conservative medical management in the setting of known severe three-vessel disease that was unamenable to PCI and December 2022 and known renal insufficiency.  He was started on Ranexa and dose of isosorbide was increased for management of his chest pain.  He says he has not really felt good since discharge from the hospital 1 month ago, has felt significantly weak to the point of not being able to get himself out of chair yesterday morning.  Has occasional nausea and abdominal pain, significantly short of breath with worsening peripheral edema.  He has occasional burning discomfort in his chest, although not nearly as severe compared to his last admission.  Reports compliance with his medications.  He was given IV Lasix in the emergency department, also placed on supplemental oxygen and at my time of evaluation today he feels a little bit better in general, but still remains significantly weak.  Denies current chest pain or shortness of  breath, peripheral edema improving but still present.   Chest x-ray with cardiomegaly, vascular congestion and small pleural effusions C/W CHF. BNP was elevated to 2000, high-sensitivity troponin minimally elevated with a flat trend at 33, 35, BUN/creatinine on admission was 48/2.56 and GFR is 23, improved with diuresis to 40/2.03 GFR of 30.  H&H 10/30, thrombocytopenia with 96,000 platelets.Recent vitals are notable for a blood pressure of 122/69, heart rate in the 70s with some periods of ventricular pacing, multiple short runs of NSVT ranging from 3-5 beats this morning.  He is saturating well on 3 L by nasal cannula.  Review of systems complete and found to be negative unless listed above     Past Medical History:  Diagnosis Date   Absolute anemia 12/08/2014   Allergy    Anginal pain ([place])    much less angina d/t lack of activity   Arthritis    all over body   Atrial fibrillation ([place])    Bradycardia 05/03/2015   Calculus of kidney 12/08/2014   Coronary artery disease    GERD (gastroesophageal reflux disease)    Glaucoma    History of kidney stones    HLD (hyperlipidemia)    Hyperglycemia 08/27/2015   Hypertension    Macular degeneration    Myocardial infarction [facility]) 2011   Peripheral vascular disease ([place])    VHD (valvular heart disease)     Past Surgical History:  Procedure Laterality Date   ANTERIOR CERVICAL DECOMP/DISCECTOMY FUSION N/A 12/30/2017   Procedure: ANTERIOR CERVICAL DECOMPRESSION/DISCECTOMY FUSION 3 LEVELS- C3-6;  Surgeon: Meade Maw, MD;  Location: [facility] ORS;  Service: Neurosurgery;  Laterality: N/A;   CARDIAC CATHETERIZATION  2007   CARDIAC CATHETERIZATION N/A 04/17/2015   Procedure: Left Heart Cath and Coronary Angiography;  Surgeon: Corey Skains, MD;  Location: [place] [facility];  Service: Cardiovascular;  Laterality: N/A;   CARPAL TUNNEL RELEASE Bilateral    right 06/09/08 left 05/03/09   CATARACT EXTRACTION W/ INTRAOCULAR LENS  IMPLANT,  BILATERAL     COLONOSCOPY N/A 10/02/2014   Procedure: COLONOSCOPY;  Surgeon: Manya Silvas, MD;  Location[facility] ENDOSCOPY;  Service: Endoscopy;  Laterality: N/A;   CORONARY ARTERY BYPASS GRAFT  2007   ESOPHAGOGASTRODUODENOSCOPY     EYE SURGERY     HEMORROIDECTOMY     JOINT REPLACEMENT Right 03/2017   partial knee replacement   KNEE ARTHROSCOPY Right 08/05/2016   Procedure: ARTHROSCOPY KNEE;  Surgeon: Thornton Park, MD;  Location: [facility] ORS;  Service: Orthopedics;  Laterality: Right;   KNEE ARTHROSCOPY WITH LATERAL RELEASE Right 08/05/2016   Procedure: KNEE ARTHROSCOPY WITH LATERAL RELEASE;  Surgeon: Thornton Park, MD;  Location: [facility] ORS;  Service: Orthopedics;  Laterality: Right;   KNEE ARTHROSCOPY WITH MEDIAL MENISECTOMY Right 08/05/2016   Procedure: KNEE ARTHROSCOPY WITH MEDIAL MENISECTOMY;  Surgeon: Thornton Park, MD;  Location: [facility] ORS;  Service: Orthopedics;  Laterality: Right;  Partial   LEFT HEART CATH AND CORONARY ANGIOGRAPHY Left 05/16/2021   Procedure: LEFT HEART CATH AND CORONARY ANGIOGRAPHY;  Surgeon: Corey Skains, MD;  Location: [place] [facility];  Service: Cardiovascular;  Laterality: Left;   LUMBAR EPIDURAL INJECTION  2011   LUMBAR LAMINECTOMY/DECOMPRESSION MICRODISCECTOMY N/A 03/10/2018   Procedure: LUMBAR LAMINECTOMY/DECOMPRESSION MICRODISCECTOMY 3 LEVELS-L2-5;  Surgeon: Meade Maw, MD;  Location: [facility] ORS;  Service: Neurosurgery;  Laterality: N/A;   MOUTH SURGERY     tooth implant   PACEMAKER LEADLESS INSERTION N/A 08/27/2021   Procedure: PACEMAKER LEADLESS INSERTION;  Surgeon: Isaias Cowman, MD;  Location: [place] [facility];  Service: Cardiovascular;  Laterality: N/A;   SHOULDER SURGERY Right 2011   rotator cuff repair   SYNOVECTOMY Right 08/05/2016   Procedure: SYNOVECTOMY;  Surgeon: Thornton Park, MD;  Location: [facility] ORS;  Service: Orthopedics;  Laterality: Right;  arthroscopic extensive synovectomy   TONSILLECTOMY      (Not in a hospital  admission)  Social History   Socioeconomic History   Marital status: Married    Spouse name: Blanch Media   Number of children: 1   Years of education: Not on file   Highest education level: Some college, no degree  Occupational History   Occupation: Engineer, water: RETIRED  Tobacco Use   Smoking status: Former    Years: 15.00    Types: Cigarettes    Quit date: 1980    Years since quitting: 44.1   Smokeless tobacco: Never  Vaping Use   Vaping Use: Never used  Substance and Sexual Activity   Alcohol use: Yes    Alcohol/week: 3.0 standard drinks of alcohol    Types: 3 Glasses of wine per week   Drug use: No   Sexual activity: Not Currently  Other Topics Concern   Not on file  Social History Narrative   Not on file   Social Determinants of Health   Financial Resource Strain: Low Risk  (06/05/2022)   Overall Financial Resource Strain (CARDIA)    Difficulty of Paying Living Expenses: Not hard at all  Food Insecurity: No Food Insecurity (06/13/2022)   Hunger Vital Sign    Worried About Running  Out of Food in the Last Year: Never true    Ran Out of Food in the Last Year: Never true  Transportation Needs: No Transportation Needs (06/13/2022)   PRAPARE - Hydrologist (Medical): No    Lack of Transportation (Non-Medical): No  Physical Activity: Sufficiently Active (06/05/2022)   Exercise Vital Sign    Days of Exercise per Week: 4 days    Minutes of Exercise per Session: 40 min  Stress: No Stress Concern Present (06/05/2022)   [place]    Feeling of Stress : Not at all  Social Connections: Moderately Integrated (06/05/2022)   Social Connection and Isolation Panel [NHANES]    Frequency of Communication with Friends and Family: More than three times a week    Frequency of Social Gatherings with Friends and Family: Twice a week    Attends Religious Services: More than 4  times per year    Active Member of [employer] or Organizations: No    Attends Archivist Meetings: Never    Marital Status: Married  Human resources officer Violence: Not At Risk (06/13/2022)   Humiliation, Afraid, Rape, and Kick questionnaire    Fear of Current or Ex-Partner: No    Emotionally Abused: No    Physically Abused: No    Sexually Abused: No    Family History  Problem Relation Age of Onset   Heart disease Mother    Heart disease Father    Heart disease Brother    Heart disease Brother    Heart disease Brother    Heart disease Brother    Heart disease Brother       Intake/Output Summary (Last 24 hours) at 07/08/2022 3474 Last data filed at 07/07/2022 2130 Gross per 24 hour  Intake --  Output 400 ml  Net -400 ml    Vitals:   07/08/22 0700 07/08/22 0800 07/08/22 0830 07/08/22 0839  BP: 103/72 (!) 112/57    Pulse: 62 66 80   Resp: '12 11 19   '$ Temp:    97.6 F (36.4 C)  TempSrc:    Oral  SpO2: 100% 100% 99%   Weight:      Height:        PHYSICAL EXAM General: Elderly and ill-appearing Caucasian male, in no acute distress.  Sitting upright in ED stretcher with wife at bedside. HEENT:  Normocephalic and atraumatic. Neck:  No JVD.  Lungs: Normal respiratory effort on 3 L by nasal cannula.  Decreased breath sounds with trace crackles bilaterally  heart: HRRR . Normal S1 and S2 without gallops or murmurs.  Abdomen: Non-distended appearing.  Msk: Normal strength and tone for age. Extremities: Warm and well perfused. No clubbing, cyanosis.  2+ dependent edema in bilateral lower extremities edema.  Neuro: Fatigued, A&O x 3 Psych:  Answers questions appropriately.   Labs: Basic Metabolic Panel: Recent Labs    07/07/22 1231 07/08/22 0410  NA 124* 129*  K 4.9 3.2*  CL 95* 104  CO2 19* 16*  GLUCOSE 132* 82  BUN 48* 40*  CREATININE 2.56* 2.03*  CALCIUM 8.2* 6.6*   Liver Function Tests: Recent Labs    07/07/22 1231  AST 29  ALT 22  ALKPHOS 129*  BILITOT  1.8*  PROT 6.3*  ALBUMIN 3.5   No results for input(s): "LIPASE", "AMYLASE" in the last 72 hours. CBC: Recent Labs    07/07/22 1231 07/08/22 0410  WBC 7.7 7.1  NEUTROABS 6.7  --  HGB 11.3* 10.0*  HCT 34.7* 30.3*  MCV 91.3 91.0  PLT 108* 96*   Cardiac Enzymes: Recent Labs    07/07/22 1231 07/07/22 1457  TROPONINIHS 33* 35*   BNP: Recent Labs    07/07/22 1231  BNP 1,999.8*   D-Dimer: No results for input(s): "DDIMER" in the last 72 hours. Hemoglobin A1C: No results for input(s): "HGBA1C" in the last 72 hours. Fasting Lipid Panel: No results for input(s): "CHOL", "HDL", "LDLCALC", "TRIG", "CHOLHDL", "LDLDIRECT" in the last 72 hours. Thyroid Function Tests: No results for input(s): "TSH", "T4TOTAL", "T3FREE", "THYROIDAB" in the last 72 hours.  Invalid input(s): "FREET3" Anemia Panel: No results for input(s): "VITAMINB12", "FOLATE", "FERRITIN", "TIBC", "IRON", "RETICCTPCT" in the last 72 hours.   Radiology: US ABDOMEN LIMITED RUQ (LIVER/GB)  Result Date: 07/07/2022 CLINICAL DATA:  Nausea.  Elevated bilirubin. EXAM: ULTRASOUND ABDOMEN LIMITED RIGHT UPPER QUADRANT COMPARISON:  None Available. FINDINGS: Gallbladder: The gallbladder is thick walled measuring 3.6 mm. The gallbladder is otherwise normal. Common bile duct: Diameter: 3.1 mm Liver: Nodular contour. No mass. Diffuse increased heterogeneous echogenicity. Portal vein is patent on color Doppler imaging with normal direction of blood flow towards the liver. Other: Mild perihepatic ascites. IMPRESSION: 1. Cirrhotic morphology in the liver with no mass. Mild perihepatic ascites. 2. The gallbladder wall is mildly thickened measuring 3.6 mm. The gallbladder is otherwise normal in appearance. Electronically Signed   By: Dorise Bullion III M.D.   On: 07/07/2022 16:56   DG Abd 1 View  Result Date: 07/07/2022 CLINICAL DATA:  Burning chest pain and shortness of breath, nausea, bilirubinemia. EXAM: ABDOMEN - 1 VIEW COMPARISON:   Plain film of the abdomen dated 04/02/2010. FINDINGS: t visualized bowel gas pattern is nonobstructive. No evidence of soft tissue mass or abnormal fluid collection. No evidence of free intraperitoneal air. No evidence of renal or ureteral calculi. No acute-appearing osseous abnormality. Extensive vascular calcifications. IMPRESSION: 1. No acute findings. Nonobstructive bowel gas pattern. 2. Extensive vascular calcifications. Electronically Signed   By: Franki Cabot M.D.   On: 07/07/2022 16:00   DG Chest Portable 1 View  Result Date: 07/07/2022 CLINICAL DATA:  Chest pain EXAM: PORTABLE CHEST - 1 VIEW COMPARISON:  06/12/2022 FINDINGS: Heart is mildly enlarged. Mild pulmonary vascular congestion. Postsurgical changes of CABG and ACDF are seen. Interval development of patchy bibasilar airspace opacities and minimal pleural effusions. Degenerative changes of the shoulders again seen. Surgical anchors noted in the right humeral head. IMPRESSION: Interval development of mild cardiomegaly, pulmonary vascular congestion, bibasilar opacities and small pleural effusions, consistent with CHF. Electronically Signed   By: Miachel Roux M.D.   On: 07/07/2022 12:55   ECHOCARDIOGRAM COMPLETE  Result Date: 06/14/2022    ECHOCARDIOGRAM REPORT   Patient Name:   Austin [name] Date of Exam: 06/13/2022 Medical Rec #:  [id number]        Height:       66.0 in Accession #:    [id number]       Weight:       150.0 lb Date of Birth:  [birth year]         BSA:          1.770 m Patient Age:    45 years         BP:           135/81 mmHg Patient Gender: M                HR:  71 bpm. Exam Location:  [facility] Procedure: 2D Echo, Cardiac Doppler and Color Doppler Indications:     Elevated troponin                  Dyspnea R06.00                  Chest pain R07.9  History:         Patient has prior history of Echocardiogram examinations, most                  recent 07/11/2019.  Sonographer:     Sherrie Sport Referring Phys:  [id number] AMY N COX  Diagnosing Phys: Yolonda Kida MD IMPRESSIONS  1. Left ventricular ejection fraction, by estimation, is 25 to 30%. The left ventricle has severely decreased function. The left ventricle demonstrates global hypokinesis. The left ventricular internal cavity size was mildly to moderately dilated. There  is mild left ventricular hypertrophy. Left ventricular diastolic parameters are consistent with Grade III diastolic dysfunction (restrictive).  2. Right ventricular systolic function is moderately reduced. The right ventricular size is mildly enlarged.  3. Left atrial size was mildly dilated.  4. Right atrial size was mildly dilated.  5. The mitral valve is grossly normal. Mild mitral valve regurgitation.  6. The aortic valve is grossly normal. Aortic valve regurgitation is trivial. FINDINGS  Left Ventricle: Left ventricular ejection fraction, by estimation, is 25 to 30%. The left ventricle has severely decreased function. The left ventricle demonstrates global hypokinesis. The left ventricular internal cavity size was mildly to moderately dilated. There is mild left ventricular hypertrophy. Left ventricular diastolic parameters are consistent with Grade III diastolic dysfunction (restrictive). Right Ventricle: The right ventricular size is mildly enlarged. No increase in right ventricular wall thickness. Right ventricular systolic function is moderately reduced. Left Atrium: Left atrial size was mildly dilated. Right Atrium: Right atrial size was mildly dilated. Pericardium: There is no evidence of pericardial effusion. Mitral Valve: The mitral valve is grossly normal. Mild mitral valve regurgitation. Tricuspid Valve: The tricuspid valve is normal in structure. Tricuspid valve regurgitation is mild. Aortic Valve: The aortic valve is grossly normal. Aortic valve regurgitation is trivial. Aortic valve mean gradient measures 8.0 mmHg. Aortic valve peak gradient measures 14.2 mmHg. Aortic valve area, by VTI measures  1.21 cm. Pulmonic Valve: The pulmonic valve was grossly normal. Pulmonic valve regurgitation is not visualized. Aorta: The ascending aorta was not well visualized. IAS/Shunts: No atrial level shunt detected by color flow Doppler. Additional Comments: A device lead is visualized.  LEFT VENTRICLE PLAX 2D LVIDd:         5.30 cm      Diastology LVIDs:         4.80 cm      LV e' medial:    3.92 cm/s LV PW:         1.20 cm      LV E/e' medial:  35.2 LV IVS:        1.20 cm      LV e' lateral:   10.20 cm/s LVOT diam:     2.00 cm      LV E/e' lateral: 13.5 LV SV:         36 LV SV Index:   20 LVOT Area:     3.14 cm  LV Volumes (MOD) LV vol d, MOD A2C: 125.0 ml LV vol d, MOD A4C: 130.0 ml LV vol s, MOD A2C: 86.6 ml LV vol s, MOD A4C: 84.4  ml LV SV MOD A2C:     38.4 ml LV SV MOD A4C:     130.0 ml LV SV MOD BP:      44.2 ml RIGHT VENTRICLE RV Basal diam:  3.90 cm RV Mid diam:    3.50 cm RV S prime:     7.62 cm/s TAPSE (M-mode): 1.4 cm LEFT ATRIUM             Index        RIGHT ATRIUM           Index LA diam:        4.65 cm 2.63 cm/m   RA Area:     15.60 cm LA Vol (A2C):   75.8 ml 42.83 ml/m  RA Volume:   36.60 ml  20.68 ml/m LA Vol (A4C):   46.0 ml 25.99 ml/m LA Biplane Vol: 61.1 ml 34.53 ml/m  AORTIC VALVE AV Area (Vmax):    1.21 cm AV Area (Vmean):   1.14 cm AV Area (VTI):     1.21 cm AV Vmax:           188.67 cm/s AV Vmean:          130.667 cm/s AV VTI:            0.297 m AV Peak Grad:      14.2 mmHg AV Mean Grad:      8.0 mmHg LVOT Vmax:         72.70 cm/s LVOT Vmean:        47.400 cm/s LVOT VTI:          0.114 m LVOT/AV VTI ratio: 0.38  AORTA Ao Root diam: 3.20 cm MITRAL VALVE                TRICUSPID VALVE MV Area (PHT): 8.43 cm     TR Peak grad:   30.0 mmHg MV Decel Time: 90 msec      TR Vmax:        274.00 cm/s MV E velocity: 138.00 cm/s MV A velocity: 59.70 cm/s   SHUNTS MV E/A ratio:  2.31         Systemic VTI:  0.11 m                             Systemic Diam: 2.00 cm Yolonda Kida MD Electronically  signed by Yolonda Kida MD Signature Date/Time: 06/14/2022/9:33:40 PM    Final    DG Chest 2 View  Result Date: 06/12/2022 CLINICAL DATA:  Chest pain. EXAM: CHEST - 2 VIEW COMPARISON:  September 09, 2021. FINDINGS: Mild cardiomegaly is noted. Status post coronary bypass graft. Minimal bilateral pleural effusions are noted. No consolidative process is noted. Bony thorax is unremarkable. IMPRESSION: Minimal bilateral pleural effusions. Status post coronary bypass graft. Electronically Signed   By: Marijo Conception M.D.   On: 06/12/2022 16:22    ECHO 25-30% with global hypokinesis  TELEMETRY reviewed by me (LT) 07/08/2022 : 0s with some periods of ventricular pacing, multiple short runs of NSVT ranging from 3-5 beats this morning  EKG reviewed by me: V paced rhythm, nonspecific diffuse T wave abnormalities, overall similar to prior from January 2024  Data reviewed by me (LT) 07/08/2022: Last outpatient cardiology note, ED note, admission H&P, CBC BMP vitals telemetry troponins BNP  Principal Problem:   CHF exacerbation ([place]) Active Problems:   Benign essential HTN   Cardiomyopathy, ischemic  Chronic pain syndrome   Atrial fibrillation (HCC)   Benign prostatic hyperplasia   Obstructive sleep apnea syndrome   Sick sinus syndrome (HCC)   CKD (chronic kidney disease) stage 4, GFR 15-29 ml/min (HCC)   Chronic combined systolic and diastolic CHF (congestive heart failure) (HCC)   HFrEF (heart failure with reduced ejection fraction) ([place])    ASSESSMENT AND PLAN:  Austin [name] is a 39yoM with a PMH of severe 3vCAD s/p CABG x 3 (hx NSTEMI 05/2021 medically managed), SSS s/p Micra Leadless PPM (07/2021), paroxysmal AF on eliquis, HFrEF (25-30%, g3DD, global hypo 06/2022), mild aortic stenosis, CKD 4, HLD who presented to [facility] ED 07/07/2022 with shortness of breath and fatigue.  BNP elevated to 2000 on admission, cardiology is consulted for assistance with CHF.  # Acute on chronic HFrEF (25-30%, G3  DD, global hypo-06/2022) BNP 2000 on admission, hypervolemic with dependent 2+ peripheral edema in his lower extremities, with crackles and a supplemental oxygen requirement.  Clinically improving after initial diuresis. -Continue IV Lasix 40 mg twice daily for now -Continue GDMT with metoprolol tartrate 25 mg twice daily, Imdur 120 mg once daily, hydralazine 25 mg twice daily, no ARB, MRA, SGLT2i with renal insufficiency -Defer repeat echo -Strict I's/O  # Demand ischemia # Severe 3vCAD s/p CABG x 3   Troponin minimally elevated at 33, 35, in the setting of known renal insufficiency signs of ischemic EKG changes, this is most consistent with demand/supply mismatch and not ACS  -Continue aspirin, atorvastatin 40 mg daily  # CKD4 Improvement in BUN/creatinine, and GFR after initial diuresis, continue to monitor closely creatinine today 2.08 and GFR 30  # SSS s/p Micra leadless PPM # Paroxysmal atrial fibrillation on apixaban In sinus rhythm on telemetry with occasional periods of pacing, multiple short runs of NSVT (3-5 beats) in the setting of hypokalemia from loop diuretic use. -Continue Eliquis 2.5 mg twice daily for stroke prevention (age >68, creatinine >1.5) -Monitor and replete electrolytes for a K >4, mag >2  This patient's plan of care was discussed and created with Dr. Clayborn Bigness and he is in agreement.  Signed: Tristan Schroeder , PA-C 07/08/2022, 9:52 AM [facility]

## 2022-07-08 NOTE — Assessment & Plan Note (Signed)
Continue IV Lasix twice daily.  Last EF 25% continue Lopressor.

## 2022-07-08 NOTE — Hospital Course (Signed)
89 y.o. male with medical history significant for paroxysmal a fib, htn, bph, cad, hfref, osa, sick sinus s/p pacer, ckd4, who presents with the above.   Recent admit for nstemi, treated medically.   Since discharge hasn't felt back to normal. Has had worsening fatigue, lack of energy. Also slowly worsening shortness of breath, mainly with exertion. No chest pain but gets occasional epigastric burning sensation. Also with intermittent nausea, no vomiting. Occasional hard stools mixed with loose/liquid stools. No fevers. Home nurse came by today and advised EMS given these symptoms. EMS placed him on 2 L after noting "cyanosis" on his lips.   2/6.  Continue IV diuresis.  Physical therapy consultation.  Cardiology consultation.

## 2022-07-08 NOTE — Progress Notes (Addendum)
Progress Note   Patient: Austin [name] EQA:[id number] DOB: 01/27/1929 DOA: 07/07/2022     1 DOS: the patient was seen and examined on 07/08/2022   Brief hospital course: 89 y.o. male with medical history significant for paroxysmal a fib, htn, bph, cad, hfref, osa, sick sinus s/p pacer, ckd4, who presents with the above.   Recent admit for nstemi, treated medically.   Since discharge hasn't felt back to normal. Has had worsening fatigue, lack of energy. Also slowly worsening shortness of breath, mainly with exertion. No chest pain but gets occasional epigastric burning sensation. Also with intermittent nausea, no vomiting. Occasional hard stools mixed with loose/liquid stools. No fevers. Home nurse came by today and advised EMS given these symptoms. EMS placed him on 2 L after noting "cyanosis" on his lips.   2/6.  Continue IV diuresis.  Physical therapy consultation.  Cardiology consultation.  Assessment and Plan: * Acute on chronic systolic CHF (congestive heart failure) (HCC) Continue IV Lasix twice daily.  Last EF 25% continue Lopressor.  Cardiomyopathy, ischemic Medical management last time with NSTEMI.  Currently on aspirin metoprolol and atorvastatin, Imdur and Ranexa.  Acute kidney injury superimposed on CKD ([place]) Acute kidney injury on CKD stage IV.  Creatinine 2.56 on presentation down to 2.03.  Metabolic acidosis From chronic kidney disease.  Start sodium bicarb tablets twice daily.  History of anemia due to chronic kidney disease Check a ferritin tomorrow.  Last hemoglobin 10.  Hypokalemia Replace oral potassium  Hyponatremia Likely secondary to CHF  Benign prostatic hyperplasia Continue Flomax.  Atrial fibrillation (HCC) Paroxysmal in nature on metoprolol and Eliquis.  Benign essential HTN Continue metoprolol.        Subjective: Patient states he has not been feeling well for since the last time he was discharged from the hospital.  States his legs have  been swollen.  He has been very weak.  He has been feeling terrible.  He has some burning in the chest and a short of breath.  He does not wear oxygen at home and was placed on 3 L of oxygen.  Physical Exam: Vitals:   07/08/22 0839 07/08/22 0900 07/08/22 1000 07/08/22 1229  BP:  138/71 130/62 122/69  Pulse:  73 68 71  Resp:  '19 14 16  '$ Temp: 97.6 F (36.4 C)   98.4 F (36.9 C)  TempSrc: Oral     SpO2:  100% 100% 100%  Weight:      Height:       Physical Exam HENT:     Head: Normocephalic.     Mouth/Throat:     Pharynx: No oropharyngeal exudate.  Eyes:     General: Lids are normal.     Conjunctiva/sclera: Conjunctivae normal.  Cardiovascular:     Rate and Rhythm: Normal rate and regular rhythm.     Heart sounds: Normal heart sounds, S1 normal and S2 normal.  Pulmonary:     Breath sounds: Examination of the right-lower field reveals decreased breath sounds and rales. Examination of the left-lower field reveals decreased breath sounds and rales. Decreased breath sounds and rales present. No wheezing or rhonchi.  Abdominal:     Palpations: Abdomen is soft.     Tenderness: There is no abdominal tenderness.  Musculoskeletal:     Right lower leg: Swelling present.     Left lower leg: Swelling present.  Skin:    General: Skin is warm.     Findings: No rash.  Neurological:     Mental  Status: He is alert.     Comments: Answers questions appropriately.     Data Reviewed: Sodium 129, potassium 3.2, creatinine 2.03, CO2 16, BNP 1999, troponin 33 and 35, hemoglobin 10, platelet count 96  Family Communication: Updated patient's daughter on the phone  Disposition: Status is: Inpatient Remains inpatient appropriate because: Continue diuresis with IV Lasix  Planned Discharge Destination: Home    Time spent: 28 minutes  Author: Loletha Grayer, MD 07/08/2022 2:02 PM  For on call review [email or web address].

## 2022-07-08 NOTE — Discharge Instructions (Signed)

## 2022-07-08 NOTE — Assessment & Plan Note (Signed)
-   Continue Flomax 

## 2022-07-08 NOTE — Assessment & Plan Note (Signed)
Acute kidney injury on CKD stage IV.  Creatinine 2.56 on presentation down to 2.03.

## 2022-07-08 NOTE — ED Notes (Signed)
Pt pulled up in bed by this RN and EDT Mayra. Pt food tray set up for pt. Pt states some burning in his chest. Will inform Rn Bri

## 2022-07-08 NOTE — Assessment & Plan Note (Signed)
Replace oral potassium

## 2022-07-08 NOTE — Assessment & Plan Note (Signed)
Continue metoprolol. 

## 2022-07-08 NOTE — Plan of Care (Signed)
  Problem: Clinical Measurements: Goal: Respiratory complications will improve Outcome: Progressing   Problem: Clinical Measurements: Goal: Cardiovascular complication will be avoided Outcome: Progressing   Problem: Elimination: Goal: Will not experience complications related to urinary retention Outcome: Progressing   Problem: Pain Managment: Goal: General experience of comfort will improve Outcome: Progressing   Problem: Safety: Goal: Ability to remain free from injury will improve Outcome: Progressing

## 2022-07-08 NOTE — Assessment & Plan Note (Signed)
Likely secondary to CHF

## 2022-07-09 ENCOUNTER — Encounter: Payer: Self-pay | Admitting: Obstetrics and Gynecology

## 2022-07-09 DIAGNOSIS — I5023 Acute on chronic systolic (congestive) heart failure: Secondary | ICD-10-CM | POA: Diagnosis not present

## 2022-07-09 LAB — CBC
HCT: 32.9 % — ABNORMAL LOW (ref 39.0–52.0)
Hemoglobin: 10.9 g/dL — ABNORMAL LOW (ref 13.0–17.0)
MCH: 29.5 pg (ref 26.0–34.0)
MCHC: 33.1 g/dL (ref 30.0–36.0)
MCV: 89.2 fL (ref 80.0–100.0)
Platelets: 111 10*3/uL — ABNORMAL LOW (ref 150–400)
RBC: 3.69 MIL/uL — ABNORMAL LOW (ref 4.22–5.81)
RDW: 14.6 % (ref 11.5–15.5)
WBC: 7.3 10*3/uL (ref 4.0–10.5)
nRBC: 0 % (ref 0.0–0.2)

## 2022-07-09 LAB — BASIC METABOLIC PANEL
Anion gap: 11 (ref 5–15)
BUN: 48 mg/dL — ABNORMAL HIGH (ref 8–23)
CO2: 17 mmol/L — ABNORMAL LOW (ref 22–32)
Calcium: 8.1 mg/dL — ABNORMAL LOW (ref 8.9–10.3)
Chloride: 99 mmol/L (ref 98–111)
Creatinine, Ser: 2.72 mg/dL — ABNORMAL HIGH (ref 0.61–1.24)
GFR, Estimated: 21 mL/min — ABNORMAL LOW (ref >60)
Glucose, Bld: 83 mg/dL (ref 70–99)
Potassium: 4.6 mmol/L (ref 3.5–5.1)
Sodium: 127 mmol/L — ABNORMAL LOW (ref 135–145)

## 2022-07-09 LAB — GLUCOSE, CAPILLARY: Glucose-Capillary: 107 mg/dL — ABNORMAL HIGH (ref 70–99)

## 2022-07-09 LAB — FERRITIN: Ferritin: 109 ng/mL (ref 24–336)

## 2022-07-09 MED ORDER — OXYMETAZOLINE HCL 0.05 % NA SOLN
1.0000 | Freq: Two times a day (BID) | NASAL | Status: DC
  Administered 2022-07-09 – 2022-07-11 (×5): 1 via NASAL
  Filled 2022-07-09: qty 15

## 2022-07-09 NOTE — Progress Notes (Signed)
Occupational Therapy Treatment Patient Details Name: Austin [name] MRN: [id number] DOB: [birth year] Today's Date: 07/09/2022   History of present illness HASSON GASPARD is a 89 y.o. male with medical history significant for paroxysmal a fib, htn, bph, cad, hfref, osa, sick sinus s/p pacer, ckd4, who presents with the above.     Recent admit for nstemi, treated medically. Since discharge hasn't felt back to normal. Has had worsening fatigue, lack of energy. Also slowly worsening shortness of breath, mainly with exertion. No chest pain but gets occasional epigastric burning sensation. Also with intermittent nausea, no vomiting. Occasional hard stools mixed with loose/liquid stools. No fevers. Home nurse came by today and advised EMS given these symptoms. EMS placed him on 2 L after noting "cyanosis" on his lips.   OT comments  Upon entering session, pt supine in bed and agreeable to OT. Pt agreeable to sit up in recliner for dinner. He required Min A to come to EOB, Min A to stand from EOB, and Min guard to take steps toward recliner using RW. Pt then engaged in seated BUE AROM exercises (see details below). Pt is making progress toward goal completion. D/C recommendation remains appropriate. OT will continue to follow acutely.     Recommendations for follow up therapy are one component of a multi-disciplinary discharge planning process, led by the attending physician.  Recommendations may be updated based on patient status, additional functional criteria and insurance authorization.    Follow Up Recommendations  Follow physician's recommendations for discharge plan and follow up therapies     Assistance Recommended at Discharge Intermittent Supervision/Assistance  Patient can return home with the following  A little help with walking and/or transfers;A little help with bathing/dressing/bathroom;Assistance with cooking/housework;Assist for transportation;Help with stairs or ramp for entrance    Equipment Recommendations  None recommended by OT    Recommendations for Other Services      Precautions / Restrictions Precautions Precautions: Fall Restrictions Weight Bearing Restrictions: No       Mobility Bed Mobility Overal bed mobility: Needs Assistance Bed Mobility: Supine to Sit     Supine to sit: HOB elevated, Min assist (for trunk elevation)          Transfers Overall transfer level: Needs assistance Equipment used: Rolling walker (2 wheels) Transfers: Sit to/from Stand Sit to Stand: Min assist (from EOB)                 Balance Overall balance assessment: Needs assistance Sitting-balance support: Bilateral upper extremity supported, Feet supported Sitting balance-Leahy Scale: Good     Standing balance support: Single extremity supported, No upper extremity supported Standing balance-Leahy Scale: Fair                             ADL either performed or assessed with clinical judgement   ADL Overall ADL's : Needs assistance/impaired                                     Functional mobility during ADLs: Min guard;Rolling walker (2 wheels) (to take several steps toward recliner, ~5 ft) General ADL Comments: Pt deferred toileting/grooming tasks    Extremity/Trunk Assessment Upper Extremity Assessment Upper Extremity Assessment: Generalized weakness   Lower Extremity Assessment Lower Extremity Assessment: Generalized weakness   Cervical / Trunk Assessment Cervical / Trunk Assessment: Normal    Vision Baseline Vision/History: 1  Wears glasses Patient Visual Report: No change from baseline     Perception     Praxis      Cognition Arousal/Alertness: Awake/alert Behavior During Therapy: WFL for tasks assessed/performed Overall Cognitive Status: Within Functional Limits for tasks assessed                                          Exercises Other Exercises Other Exercises: Pt engaged in seated  BUE AROM exercises (x10 each): wrist flexion/extension, elbow flexion/extension, shoulder flexion, shoulder horizontal abduction/adduction. Tolerance imited 2/2 L shoulder pain.    Shoulder Instructions       General Comments VSS on RA    Pertinent Vitals/ Pain       Pain Assessment Pain Assessment: No/denies pain  Home Living                                          Prior Functioning/Environment              Frequency  Min 2X/week        Progress Toward Goals  OT Goals(current goals can now be found in the care plan section)  Progress towards OT goals: Progressing toward goals  Acute Rehab OT Goals Patient Stated Goal: improve function/independence OT Goal Formulation: With patient/family Time For Goal Achievement: 07/22/22 Potential to Achieve Goals: [place] Discharge plan remains appropriate;Frequency remains appropriate    Co-evaluation                 AM-PAC OT "6 Clicks" Daily Activity     Outcome Measure   Help from another person eating meals?: None Help from another person taking care of personal grooming?: A Little Help from another person toileting, which includes using toliet, bedpan, or urinal?: A Little Help from another person bathing (including washing, rinsing, drying)?: A Little Help from another person to put on and taking off regular upper body clothing?: A Little Help from another person to put on and taking off regular lower body clothing?: A Little 6 Click Score: 19    End of Session Equipment Utilized During Treatment: Gait belt;Rolling walker (2 wheels)  OT Visit Diagnosis: Unsteadiness on feet (R26.81);Muscle weakness (generalized) (M62.81)   Activity Tolerance Patient tolerated treatment well;Patient limited by pain   Patient Left in chair;with call bell/phone within reach;with chair alarm set   Nurse Communication Mobility status        Time: 9485-4627 OT Time Calculation (min): 14  min  Charges: OT General Charges $OT Visit: 1 Visit OT Treatments $Therapeutic Activity: 8-22 mins  [facility] MS, OTR/L ascom [phone]  07/09/22, 5:15 PM

## 2022-07-09 NOTE — Progress Notes (Signed)
Physical Therapy Treatment Patient Details Name: Austin [name] MRN: [id number] DOB: 1929/01/26 Today's Date: 07/09/2022   History of Present Illness Austin [name] is a 89 y.o. male with medical history significant for paroxysmal a fib, htn, bph, cad, hfref, osa, sick sinus s/p pacer, ckd4, who presents with the above.     Recent admit for nstemi, treated medically. Since discharge hasn't felt back to normal. Has had worsening fatigue, lack of energy. Also slowly worsening shortness of breath, mainly with exertion. No chest pain but gets occasional epigastric burning sensation. Also with intermittent nausea, no vomiting. Occasional hard stools mixed with loose/liquid stools. No fevers. Home nurse came by today and advised EMS given these symptoms. EMS placed him on 2 L after noting "cyanosis" on his lips.    PT Comments    Pt received in bed, assisted with Left hearing aid. Discussed PT goals and safe transition home with possible hospice. Discussed benefits of light weight w/c for short distances and appointments. Pt appears to be improving functionally. O2 sats remained in upper 90's with short distance gait in room with RW. Pt has supportive family who will provide assist. Will plan to continue PT acutely per POC until d/c home.    Recommendations for follow up therapy are one component of a multi-disciplinary discharge planning process, led by the attending physician.  Recommendations may be updated based on patient status, additional functional criteria and insurance authorization.  Follow Up Recommendations  Follow physician's recommendations for discharge plan and follow up therapies     Assistance Recommended at Discharge Intermittent Supervision/Assistance  Patient can return home with the following A little help with walking and/or transfers;A little help with bathing/dressing/bathroom;Assistance with cooking/housework;Assist for transportation;Help with stairs or ramp for entrance    Equipment Recommendations   Neurosurgeon)    Recommendations for Other Services       Precautions / Restrictions Precautions Precautions: Fall Restrictions Weight Bearing Restrictions: No     Mobility  Bed Mobility Overal bed mobility: Needs Assistance Bed Mobility: Supine to Sit     Supine to sit: HOB elevated, Min assist          Transfers Overall transfer level: Needs assistance Equipment used: Rolling walker (2 wheels) Transfers: Sit to/from Stand Sit to Stand: Min assist           General transfer comment:  (Initial assist to steady in standing)    Ambulation/Gait Ambulation/Gait assistance: Min guard Gait Distance (Feet): 30 Feet Assistive device: Rolling walker (2 wheels) Gait Pattern/deviations: Step-through pattern, Decreased step length - right, Decreased step length - left, Trunk flexed Gait velocity: decreased     General Gait Details: RA with O2 sats remaining at 98%   Stairs             Wheelchair Mobility    Modified Rankin (Stroke Patients Only)       Balance Overall balance assessment: Needs assistance Sitting-balance support: Bilateral upper extremity supported, Feet supported Sitting balance-Leahy Scale: Good     Standing balance support: Single extremity supported, No upper extremity supported Standing balance-Leahy Scale: Fair                              Cognition Arousal/Alertness: Awake/alert Behavior During Therapy: WFL for tasks assessed/performed Overall Cognitive Status: Within Functional Limits for tasks assessed  General Comments:  (pleasant, cooperative, HOH)        Exercises General Exercises - Lower Extremity Ankle Circles/Pumps: AROM, Seated, 5 reps Long Arc Quad: AROM, Seated, 5 reps Hip ABduction/ADduction: AROM, Seated, 5 reps Hip Flexion/Marching: AROM, Seated, 5 reps Other Exercises Other  Exercises: Educated pt and pt's family on safe transition home to prevent falls and progress mobility as tolerated. HEP for B LE's issued with good understanding    General Comments General comments (skin integrity, edema, etc.): O2 sats on RA maintaining 98% upon exertion      Pertinent Vitals/Pain Pain Assessment Pain Assessment: No/denies pain    Home Living                          Prior Function            PT Goals (current goals can now be found in the care plan section) Acute Rehab PT Goals Patient Stated Goal: improve mobility if medically possible Progress towards PT goals: Progressing toward goals    Frequency    Min 2X/week      PT Plan Current plan remains appropriate    Co-evaluation              AM-PAC PT "6 Clicks" Mobility   Outcome Measure  Help needed turning from your back to your side while in a flat bed without using bedrails?: A Little Help needed moving from lying on your back to sitting on the side of a flat bed without using bedrails?: A Little Help needed moving to and from a bed to a chair (including a wheelchair)?: A Little Help needed standing up from a chair using your arms (e.g., wheelchair or bedside chair)?: A Little Help needed to walk in hospital room?: A Little Help needed climbing 3-5 steps with a railing? : A Lot 6 Click Score: 17    End of Session Equipment Utilized During Treatment: Gait belt Activity Tolerance: Patient tolerated treatment well Patient left: in chair;with call bell/phone within reach;with chair alarm set;with family/visitor present Nurse Communication: Mobility status PT Visit Diagnosis: Unsteadiness on feet (R26.81);Other abnormalities of gait and mobility (R26.89);Muscle weakness (generalized) (M62.81);Difficulty in walking, not elsewhere classified (R26.2)     Time: 1761-6073 PT Time Calculation (min) (ACUTE ONLY): 40 min  Charges:  $Gait Training: 8-22 mins $Therapeutic Exercise:  8-22 mins $Therapeutic Activity: 8-22 mins                    Mikel Cella, PTA   Josie Dixon 07/09/2022, 12:33 PM

## 2022-07-09 NOTE — Progress Notes (Signed)
PROGRESS NOTE    Austin [name]  LOV:[id number] DOB: 1929-05-24 DOA: 07/07/2022 PCP: Eulas Post, MD    Brief Narrative:   89 y.o. male with medical history significant for paroxysmal a fib, htn, bph, cad, hfref, osa, sick sinus s/p pacer, ckd4, who presents with the above.   Recent admit for nstemi, treated medically.   Since discharge hasn't felt back to normal. Has had worsening fatigue, lack of energy. Also slowly worsening shortness of breath, mainly with exertion. No chest pain but gets occasional epigastric burning sensation. Also with intermittent nausea, no vomiting. Occasional hard stools mixed with loose/liquid stools. No fevers. Home nurse came by today and advised EMS given these symptoms. EMS placed him on 2 L after noting "cyanosis" on his lips.    2/6.  Continue IV diuresis.  Physical therapy consultation.  Cardiology consultation. 2/7: Creatinine worsened today.  Patient feels symptomatically improved.  Interested in engaging hospice services.  TOC and hospice liaison involved.  Assessment & Plan:   Principal Problem:   Acute on chronic systolic CHF (congestive heart failure) (HCC) Active Problems:   Cardiomyopathy, ischemic   Acute kidney injury superimposed on CKD (HCC)   Metabolic acidosis   Chronic pain syndrome   Benign essential HTN   Atrial fibrillation (HCC)   Benign prostatic hyperplasia   Obstructive sleep apnea syndrome   Sick sinus syndrome (HCC)   Hyponatremia   Hypokalemia   History of anemia due to chronic kidney disease  Acute on chronic systolic CHF (congestive heart failure) (HCC) IV Lasix on hold currently in the setting of.  Kidney function   Cardiomyopathy, ischemic Medical management last time with NSTEMI.  Currently on aspirin metoprolol and atorvastatin, Imdur and Ranexa.   Acute kidney injury superimposed on CKD ([place]) Acute kidney injury on CKD stage IV.  Creatinine 2.56 on presentation.  Down to 2.03, now back up to 2.7.  IV  diuretic on hold.   Metabolic acidosis From chronic kidney disease.  sodium bicarb tablets twice daily.   History of anemia due to chronic kidney disease Hemoglobin stable.  No indication for transfusion   Hypokalemia Monitor and replace as needed   Hyponatremia Likely secondary to CHF   Benign prostatic hyperplasia Continue Flomax.   Atrial fibrillation (HCC) Paroxysmal in nature on metoprolol and Eliquis.   Benign essential HTN Continue metoprolol.  DVT prophylaxis: Eliquis Code Status: DNR Family Communication: Disposition Plan: Status is: Inpatient Remains inpatient appropriate because: Decompensated systolic heart failure   Level of care: Telemetry Cardiac  Consultants:  Cardiology  Procedures:  None  Antimicrobials: None   Subjective: Seen and examined.  Reports minimal improvement over interval.  Inquires about getting hospice services at discharge  Objective: Vitals:   07/09/22 0537 07/09/22 0738 07/09/22 0900 07/09/22 1245  BP: (!) 118/59 118/63  109/60  Pulse: 60 64  61  Resp: '20 16  16  '$ Temp: 97.9 F (36.6 C) 98 F (36.7 C)  97.9 F (36.6 C)  TempSrc: Oral     SpO2: 100% 100%  100%  Weight:   67.4 kg   Height:        Intake/Output Summary (Last 24 hours) at 07/09/2022 1305 Last data filed at 07/09/2022 1209 Gross per 24 hour  Intake 480 ml  Output 1235 ml  Net -755 ml   Filed Weights   07/07/22 1240 07/09/22 0900  Weight: 68 kg 67.4 kg    Examination:  General exam: Appears calm and comfortable  Respiratory system: Lung  sounds diminished bilateral bases.  Scattered crackles.  No wheezing.  Normal work of breathing.  2 L Cardiovascular system: S1-S2, RRR, no murmurs, 2+ pedal edema Gastrointestinal system: Soft, NT/ND, normal bowel sounds. Central nervous system: Alert and oriented. No focal neurological deficits. Extremities: Symmetric 5 x 5 power. Skin: No rashes, lesions or ulcers Psychiatry: Judgement and insight appear  normal. Mood & affect appropriate.     Data Reviewed: I have personally reviewed following labs and imaging studies  CBC: Recent Labs  Lab 07/07/22 1231 07/08/22 0410 07/09/22 0607  WBC 7.7 7.1 7.3  NEUTROABS 6.7  --   --   HGB 11.3* 10.0* 10.9*  HCT 34.7* 30.3* 32.9*  MCV 91.3 91.0 89.2  PLT 108* 96* 505*   Basic Metabolic Panel: Recent Labs  Lab 07/07/22 1231 07/08/22 0410 07/09/22 0607  NA 124* 129* 127*  K 4.9 3.2* 4.6  CL 95* 104 99  CO2 19* 16* 17*  GLUCOSE 132* 82 83  BUN 48* 40* 48*  CREATININE 2.56* 2.03* 2.72*  CALCIUM 8.2* 6.6* 8.1*   GFR: Estimated Creatinine Clearance: 16 mL/min (A) (by C-G formula based on SCr of 2.72 mg/dL (H)). Liver Function Tests: Recent Labs  Lab 07/07/22 1231  AST 29  ALT 22  ALKPHOS 129*  BILITOT 1.8*  PROT 6.3*  ALBUMIN 3.5   No results for input(s): "LIPASE", "AMYLASE" in the last 168 hours. No results for input(s): "AMMONIA" in the last 168 hours. Coagulation Profile: No results for input(s): "INR", "PROTIME" in the last 168 hours. Cardiac Enzymes: No results for input(s): "CKTOTAL", "CKMB", "CKMBINDEX", "TROPONINI" in the last 168 hours. BNP (last 3 results) No results for input(s): "PROBNP" in the last 8760 hours. HbA1C: No results for input(s): "HGBA1C" in the last 72 hours. CBG: No results for input(s): "GLUCAP" in the last 168 hours. Lipid Profile: No results for input(s): "CHOL", "HDL", "LDLCALC", "TRIG", "CHOLHDL", "LDLDIRECT" in the last 72 hours. Thyroid Function Tests: No results for input(s): "TSH", "T4TOTAL", "FREET4", "T3FREE", "THYROIDAB" in the last 72 hours. Anemia Panel: Recent Labs    07/09/22 0607  FERRITIN 109   Sepsis Labs: No results for input(s): "PROCALCITON", "LATICACIDVEN" in the last 168 hours.  No results found for this or any previous visit (from the past 240 hour(s)).       Radiology Studies: US ABDOMEN LIMITED RUQ (LIVER/GB)  Result Date: 07/07/2022 CLINICAL DATA:   Nausea.  Elevated bilirubin. EXAM: ULTRASOUND ABDOMEN LIMITED RIGHT UPPER QUADRANT COMPARISON:  None Available. FINDINGS: Gallbladder: The gallbladder is thick walled measuring 3.6 mm. The gallbladder is otherwise normal. Common bile duct: Diameter: 3.1 mm Liver: Nodular contour. No mass. Diffuse increased heterogeneous echogenicity. Portal vein is patent on color Doppler imaging with normal direction of blood flow towards the liver. Other: Mild perihepatic ascites. IMPRESSION: 1. Cirrhotic morphology in the liver with no mass. Mild perihepatic ascites. 2. The gallbladder wall is mildly thickened measuring 3.6 mm. The gallbladder is otherwise normal in appearance. Electronically Signed   By: Dorise Bullion III M.D.   On: 07/07/2022 16:56   DG Abd 1 View  Result Date: 07/07/2022 CLINICAL DATA:  Burning chest pain and shortness of breath, nausea, bilirubinemia. EXAM: ABDOMEN - 1 VIEW COMPARISON:  Plain film of the abdomen dated 04/02/2010. FINDINGS: t visualized bowel gas pattern is nonobstructive. No evidence of soft tissue mass or abnormal fluid collection. No evidence of free intraperitoneal air. No evidence of renal or ureteral calculi. No acute-appearing osseous abnormality. Extensive vascular calcifications. IMPRESSION: 1.  No acute findings. Nonobstructive bowel gas pattern. 2. Extensive vascular calcifications. Electronically Signed   By: Franki Cabot M.D.   On: 07/07/2022 16:00        Scheduled Meds:  apixaban  2.5 mg Oral BID   aspirin  81 mg Oral Daily   atorvastatin  40 mg Oral Daily   dorzolamide-timolol  1 drop Both Eyes BID   hydrALAZINE  25 mg Oral BID   isosorbide mononitrate  120 mg Oral Daily   latanoprost  1 drop Both Eyes QHS   metoprolol tartrate  25 mg Oral BID   oxymetazoline  1 spray Each Nare BID   pantoprazole  40 mg Oral Daily   potassium chloride  20 mEq Oral BID   ranolazine  500 mg Oral BID   sodium bicarbonate  1,300 mg Oral BID   sodium chloride flush  3 mL  Intravenous Q12H   tamsulosin  0.4 mg Oral Daily   Continuous Infusions:  sodium chloride       LOS: 2 days      Sidney Ace, MD [facility]   If 7PM-7AM, please contact night-coverage  07/09/2022, 1:05 PM

## 2022-07-09 NOTE — Consult Note (Signed)
   Heart Failure Nurse Navigator Note  HFrEF 25-30%.  Grade 3 diastolic dysfunction.   He presented to the emergency room after seen by his home health nurse I asked him to come to the emergency room with his complaints of worsening fatigue, lack of energy worsening shortness of breath on exertion.  NP 2000.  Chest x-ray with mild cardiomegaly, pulmonary vascular congestion and small bilateral pleural effusions  Comorbidities:  Severe three-vessel coronary artery disease with bypass grafting Sick sinus syndrome with Micra leadless pacemaker  Atrial fibrillation on NOAC Mild aortic stenosis Chronic kidney disease stage IV Hyperlipidemia   Apixaban 2.5 mg 2 times a day Aspirin 81 mg daily Atorvastatin 40 mg daily Hydralazine 25 mg 2 times a day Isosorbide mononitrate 120 mg daily Metoprolol tartrate 25 mg 2 times a day Potassium chloride 20 mEq 2 times a day Ranexa 500 mg 2 times a day  Lasix has been discontinued.  Labs:  127, potassium 4.6, chloride 99, CO2 17, BUN 48, creatinine 2.72, estimated GFR 21 Weight 67.4 kg Intake 240 mL Output 1285 mL  Initial meeting with patient today, there were no family members present at the bedside.  He is awake and alert and in no acute distress.  Discussed heart failure and what it means.  Patient states that his kidney doctor has him on a fluid restriction from 30 to 40 ounces daily.  States that he does stick with all his parameters but does not measure.  States that he got out of the habit of weighing himself daily as his weight had been steady.  Explained the reasoning behind daily weights and reporting 2 pound weight gain overnight or 5 pounds within the week.  He voices understanding.  States that he does not use salt at the table but that he may go out and get food a couple times a week for him and his wife, dates that they usually will get [place] or [place], discussed the sodium content in those foods and to try to make  healthier choices.  He states that he is waiting for palliative consult.  Also explained follow-up in the outpatient heart failure clinic.  And that is he is being followed by palliative he could still follow-up in the clinic but when he oceans to hospice that we would follow him any longer.  Voices understanding.  He has appointment with the outpatient heart failure clinic on February 19 at 1 PM.  He has a 3% no-show ratio.  Given the living with heart failure teaching booklet, zone magnet, info on low-sodium, heart failure and weight chart.  He had no further questions at this time.  Pricilla Riffle RN [facility]

## 2022-07-09 NOTE — Progress Notes (Signed)
[place] NOTE       Patient ID: Austin [name] MRN: [id number] DOB/AGE: [birth year] 89 y.o.  Admit date: 07/07/2022 Referring Physician Dr. Leslye Peer Primary Physician Dr. Miguel Aschoff Primary Cardiologist Dr. Nehemiah Massed  Reason for Consultation AoCHF  HPI: Austin [name] is a 89yoM with a PMH of severe 3vCAD s/p CABG x 3 (hx NSTEMI 05/2021 medically managed), SSS s/p Micra Leadless PPM (07/2021), paroxysmal AF on eliquis, HFrEF (25-30%, g3DD, global hypo 06/2022), mild aortic stenosis, CKD 4, HLD who presented to [facility] ED 07/07/2022 with shortness of breath and fatigue.  BNP elevated to 2000 on admission, cardiology is consulted for assistance with CHF.  Interval History: -feels "fair." No real change in his energy, still dyspneic with ambulation to the restroom, but off supplemental O2.  -Cr bumped today from 2.03 -- 2.72. holding further diuresis today -no chest pain, peripheral edema improved  Review of systems complete and found to be negative unless listed above     Past Medical History:  Diagnosis Date   Absolute anemia 12/08/2014   Allergy    Anginal pain ([place])    much less angina d/t lack of activity   Arthritis    all over body   Atrial fibrillation ([place])    Bradycardia 05/03/2015   Calculus of kidney 12/08/2014   Coronary artery disease    GERD (gastroesophageal reflux disease)    Glaucoma    History of kidney stones    HLD (hyperlipidemia)    Hyperglycemia 08/27/2015   Hypertension    Macular degeneration    Myocardial infarction [facility]) 2011   Peripheral vascular disease ([place])    VHD (valvular heart disease)     Past Surgical History:  Procedure Laterality Date   ANTERIOR CERVICAL DECOMP/DISCECTOMY FUSION N/A 12/30/2017   Procedure: ANTERIOR CERVICAL DECOMPRESSION/DISCECTOMY FUSION 3 LEVELS- C3-6;  Surgeon: Meade Maw, MD;  Location: [facility] ORS;  Service: Neurosurgery;  Laterality: N/A;   CARDIAC CATHETERIZATION  2007    CARDIAC CATHETERIZATION N/A 04/17/2015   Procedure: Left Heart Cath and Coronary Angiography;  Surgeon: Corey Skains, MD;  Location: [place] CV LAB;  Service: Cardiovascular;  Laterality: N/A;   CARPAL TUNNEL RELEASE Bilateral    right 06/09/08 left 05/03/09   CATARACT EXTRACTION W/ INTRAOCULAR LENS  IMPLANT, BILATERAL     COLONOSCOPY N/A 10/02/2014   Procedure: COLONOSCOPY;  Surgeon: Manya Silvas, MD;  Location: [facility] ENDOSCOPY;  Service: Endoscopy;  Laterality: N/A;   CORONARY ARTERY BYPASS GRAFT  2007   ESOPHAGOGASTRODUODENOSCOPY     EYE SURGERY     HEMORROIDECTOMY     JOINT REPLACEMENT Right 03/2017   partial knee replacement   KNEE ARTHROSCOPY Right 08/05/2016   Procedure: ARTHROSCOPY KNEE;  Surgeon: [place] Park, MD;  Location: [facility] ORS;  Service: Orthopedics;  Laterality: Right;   KNEE ARTHROSCOPY WITH LATERAL RELEASE Right 08/05/2016   Procedure: KNEE ARTHROSCOPY WITH LATERAL RELEASE;  Surgeon: [place] Park, MD;  Location: [facility] ORS;  Service: Orthopedics;  Laterality: Right;   KNEE ARTHROSCOPY WITH MEDIAL MENISECTOMY Right 08/05/2016   Procedure: KNEE ARTHROSCOPY WITH MEDIAL MENISECTOMY;  Surgeon: [place] Park, MD;  Location: [facility] ORS;  Service: Orthopedics;  Laterality: Right;  Partial   LEFT HEART CATH AND CORONARY ANGIOGRAPHY Left 05/16/2021   Procedure: LEFT HEART CATH AND CORONARY ANGIOGRAPHY;  Surgeon: Corey Skains, MD;  Location: [place] CV LAB;  Service: Cardiovascular;  Laterality: Left;   LUMBAR EPIDURAL INJECTION  2011   LUMBAR LAMINECTOMY/DECOMPRESSION MICRODISCECTOMY N/A 03/10/2018  Procedure: LUMBAR LAMINECTOMY/DECOMPRESSION MICRODISCECTOMY 3 LEVELS-L2-5;  Surgeon: Meade Maw, MD;  Location: [facility] ORS;  Service: Neurosurgery;  Laterality: N/A;   MOUTH SURGERY     tooth implant   PACEMAKER LEADLESS INSERTION N/A 08/27/2021   Procedure: PACEMAKER LEADLESS INSERTION;  Surgeon: Isaias Cowman, MD;  Location: [place] CV LAB;   Service: Cardiovascular;  Laterality: N/A;   SHOULDER SURGERY Right 2011   rotator cuff repair   SYNOVECTOMY Right 08/05/2016   Procedure: SYNOVECTOMY;  Surgeon: [place] Park, MD;  Location: [facility] ORS;  Service: Orthopedics;  Laterality: Right;  arthroscopic extensive synovectomy   TONSILLECTOMY      Medications Prior to Admission  Medication Sig Dispense Refill Last Dose   apixaban (ELIQUIS) 2.5 MG TABS tablet Take 1 tablet (2.5 mg total) by mouth 2 (two) times daily. 60 tablet 0 07/07/2022   atorvastatin (LIPITOR) 40 MG tablet Take 1 tablet (40 mg total) by mouth daily. 30 tablet 2 07/07/2022   Biotin 5000 MCG CAPS Take 5,000 mcg by mouth daily.    Past Week   Cholecalciferol (VITAMIN D3) 2000 UNITS capsule Take 2,000 Units by mouth 2 (two) times daily.    Past Week   Coenzyme Q10 (COQ-10 PO) Take 200 mg by mouth daily.   Past Week   Cyanocobalamin (VITAMIN B-12) 5000 MCG SUBL Place 5,000 mcg under the tongue daily.   Past Week   dorzolamide-timolol (COSOPT) 22.3-6.8 MG/ML ophthalmic solution Place 1 drop into both eyes 2 (two) times daily.    07/07/2022   hydrALAZINE (APRESOLINE) 25 MG tablet Take 1 tablet (25 mg total) by mouth 2 (two) times daily. 60 tablet 2 07/07/2022   isosorbide mononitrate (IMDUR) 120 MG 24 hr tablet Take 1 tablet (120 mg total) by mouth daily. 30 tablet 2 07/07/2022   latanoprost (XALATAN) 0.005 % ophthalmic solution Place 1 drop into both eyes at bedtime.   07/06/2022   Melatonin 5 MG CAPS Take 5 mg by mouth at bedtime as needed (for sleep).    07/06/2022   metoprolol tartrate (LOPRESSOR) 50 MG tablet Take 0.5 tablets (25 mg total) by mouth 2 (two) times daily. 30 tablet 1 07/07/2022   nitroGLYCERIN (NITROSTAT) 0.4 MG SL tablet Place 0.4 mg under the tongue every 5 (five) minutes as needed for chest pain.    07/06/2022   omeprazole (PRILOSEC) 20 MG capsule Take 20 mg by mouth daily.    07/07/2022   Polyethyl Glycol-Propyl Glycol (SYSTANE OP) Place 1 drop into the right eye daily as  needed (burning).   07/06/2022   ranolazine (RANEXA) 500 MG 12 hr tablet Take 1 tablet by mouth 2 (two) times daily.   07/07/2022   tamsulosin (FLOMAX) 0.4 MG CAPS capsule TAKE 1 CAPSULE BY MOUTH ONCE DAILY 90 capsule 0 07/06/2022   TURMERIC PO Take 1,000 mg by mouth daily.   Past Week   aspirin 81 MG chewable tablet Chew 1 tablet (81 mg total) by mouth daily. (Patient not taking: Reported on 07/07/2022) 30 tablet 2 Not Taking   ranolazine (RANEXA) 1000 MG SR tablet Take 1 tablet (1,000 mg total) by mouth 2 (two) times daily. (Patient not taking: Reported on 07/07/2022) 60 tablet 2 Not Taking   torsemide (DEMADEX) 20 MG tablet Take 10 mg by mouth daily. (Patient not taking: Reported on 07/07/2022)   Not Taking    Social History   Socioeconomic History   Marital status: Married    Spouse name: Blanch Media   Number of children: 1   Years  of education: Not on file   Highest education level: Some college, no degree  Occupational History   Occupation: Engineer, water: RETIRED  Tobacco Use   Smoking status: Former    Years: 15.00    Types: Cigarettes    Quit date: 1980    Years since quitting: 44.1   Smokeless tobacco: Never  Vaping Use   Vaping Use: Never used  Substance and Sexual Activity   Alcohol use: Yes    Alcohol/week: 3.0 standard drinks of alcohol    Types: 3 Glasses of wine per week   Drug use: No   Sexual activity: Not Currently  Other Topics Concern   Not on file  Social History Narrative   Not on file   Social Determinants of Health   Financial Resource Strain: Low Risk  (06/05/2022)   Overall Financial Resource Strain (CARDIA)    Difficulty of Paying Living Expenses: Not hard at all  Food Insecurity: No Food Insecurity (07/08/2022)   Hunger Vital Sign    Worried About Running Out of Food in the Last Year: Never true    Ran Out of Food in the Last Year: Never true  Transportation Needs: No Transportation Needs (07/08/2022)   PRAPARE - Armed forces logistics/support/administrative officer (Medical): No    Lack of Transportation (Non-Medical): No  Physical Activity: Sufficiently Active (06/05/2022)   Exercise Vital Sign    Days of Exercise per Week: 4 days    Minutes of Exercise per Session: 40 min  Stress: No Stress Concern Present (06/05/2022)   [place]    Feeling of Stress : Not at all  Social Connections: Moderately Integrated (06/05/2022)   Social Connection and Isolation Panel [NHANES]    Frequency of Communication with Friends and Family: More than three times a week    Frequency of Social Gatherings with Friends and Family: Twice a week    Attends Religious Services: More than 4 times per year    Active Member of [employer] or Organizations: No    Attends Archivist Meetings: Never    Marital Status: Married  Human resources officer Violence: Not At Risk (07/08/2022)   Humiliation, Afraid, Rape, and Kick questionnaire    Fear of Current or Ex-Partner: No    Emotionally Abused: No    Physically Abused: No    Sexually Abused: No    Family History  Problem Relation Age of Onset   Heart disease Mother    Heart disease Father    Heart disease Brother    Heart disease Brother    Heart disease Brother    Heart disease Brother    Heart disease Brother       Intake/Output Summary (Last 24 hours) at 07/09/2022 1108 Last data filed at 07/09/2022 1000 Gross per 24 hour  Intake 480 ml  Output 1035 ml  Net -555 ml     Vitals:   07/09/22 0200 07/09/22 0537 07/09/22 0738 07/09/22 0900  BP:  (!) 118/59 118/63   Pulse:  60 64   Resp:  20 16   Temp:  97.9 F (36.6 C) 98 F (36.7 C)   TempSrc:  Oral    SpO2: 100% 100% 100%   Weight:    67.4 kg  Height:        PHYSICAL EXAM General: Elderly and ill-appearing Caucasian male, in no acute distress.  Sitting upright in PCU bed eating breakfast.  HEENT:  Normocephalic and atraumatic. Neck:  No JVD.  Lungs: Normal respiratory effort  on room air. Clear to auscultation bilaterally heart: paced rhythm . Normal S1 and S2 without gallops or murmurs.  Abdomen: Non-distended appearing.  Msk: Normal strength and tone for age. Extremities: Warm and well perfused. No clubbing, cyanosis.  1+ dependent edema predominantly in the right calf, overall much improved Neuro: Fatigued, A&O x 3 Psych:  Answers questions appropriately.   Labs: Basic Metabolic Panel: Recent Labs    07/08/22 0410 07/09/22 0607  NA 129* 127*  K 3.2* 4.6  CL 104 99  CO2 16* 17*  GLUCOSE 82 83  BUN 40* 48*  CREATININE 2.03* 2.72*  CALCIUM 6.6* 8.1*    Liver Function Tests: Recent Labs    07/07/22 1231  AST 29  ALT 22  ALKPHOS 129*  BILITOT 1.8*  PROT 6.3*  ALBUMIN 3.5    No results for input(s): "LIPASE", "AMYLASE" in the last 72 hours. CBC: Recent Labs    07/07/22 1231 07/08/22 0410 07/09/22 0607  WBC 7.7 7.1 7.3  NEUTROABS 6.7  --   --   HGB 11.3* 10.0* 10.9*  HCT 34.7* 30.3* 32.9*  MCV 91.3 91.0 89.2  PLT 108* 96* 111*    Cardiac Enzymes: Recent Labs    07/07/22 1231 07/07/22 1457  TROPONINIHS 33* 35*    BNP: Recent Labs    07/07/22 1231  BNP 1,999.8*    D-Dimer: No results for input(s): "DDIMER" in the last 72 hours. Hemoglobin A1C: No results for input(s): "HGBA1C" in the last 72 hours. Fasting Lipid Panel: No results for input(s): "CHOL", "HDL", "LDLCALC", "TRIG", "CHOLHDL", "LDLDIRECT" in the last 72 hours. Thyroid Function Tests: No results for input(s): "TSH", "T4TOTAL", "T3FREE", "THYROIDAB" in the last 72 hours.  Invalid input(s): "FREET3" Anemia Panel: Recent Labs    07/09/22 0607  FERRITIN 109     Radiology: US ABDOMEN LIMITED RUQ (LIVER/GB)  Result Date: 07/07/2022 CLINICAL DATA:  Nausea.  Elevated bilirubin. EXAM: ULTRASOUND ABDOMEN LIMITED RIGHT UPPER QUADRANT COMPARISON:  None Available. FINDINGS: Gallbladder: The gallbladder is thick walled measuring 3.6 mm. The gallbladder is otherwise  normal. Common bile duct: Diameter: 3.1 mm Liver: Nodular contour. No mass. Diffuse increased heterogeneous echogenicity. Portal vein is patent on color Doppler imaging with normal direction of blood flow towards the liver. Other: Mild perihepatic ascites. IMPRESSION: 1. Cirrhotic morphology in the liver with no mass. Mild perihepatic ascites. 2. The gallbladder wall is mildly thickened measuring 3.6 mm. The gallbladder is otherwise normal in appearance. Electronically Signed   By: Dorise Bullion III M.D.   On: 07/07/2022 16:56   DG Abd 1 View  Result Date: 07/07/2022 CLINICAL DATA:  Burning chest pain and shortness of breath, nausea, bilirubinemia. EXAM: ABDOMEN - 1 VIEW COMPARISON:  Plain film of the abdomen dated 04/02/2010. FINDINGS: t visualized bowel gas pattern is nonobstructive. No evidence of soft tissue mass or abnormal fluid collection. No evidence of free intraperitoneal air. No evidence of renal or ureteral calculi. No acute-appearing osseous abnormality. Extensive vascular calcifications. IMPRESSION: 1. No acute findings. Nonobstructive bowel gas pattern. 2. Extensive vascular calcifications. Electronically Signed   By: Franki Cabot M.D.   On: 07/07/2022 16:00   DG Chest Portable 1 View  Result Date: 07/07/2022 CLINICAL DATA:  Chest pain EXAM: PORTABLE CHEST - 1 VIEW COMPARISON:  06/12/2022 FINDINGS: Heart is mildly enlarged. Mild pulmonary vascular congestion. Postsurgical changes of CABG and ACDF are seen. Interval development of patchy bibasilar airspace  opacities and minimal pleural effusions. Degenerative changes of the shoulders again seen. Surgical anchors noted in the right humeral head. IMPRESSION: Interval development of mild cardiomegaly, pulmonary vascular congestion, bibasilar opacities and small pleural effusions, consistent with CHF. Electronically Signed   By: Miachel Roux M.D.   On: 07/07/2022 12:55   ECHOCARDIOGRAM COMPLETE  Result Date: 06/14/2022    ECHOCARDIOGRAM REPORT    Patient Name:   FED CECI Date of Exam: 06/13/2022 Medical Rec #:  [id number]        Height:       66.0 in Accession #:    [id number]       Weight:       150.0 lb Date of Birth:  11-12-1928         BSA:          1.770 m Patient Age:    76 years         BP:           135/81 mmHg Patient Gender: M                HR:           71 bpm. Exam Location:  [facility] Procedure: 2D Echo, Cardiac Doppler and Color Doppler Indications:     Elevated troponin                  Dyspnea R06.00                  Chest pain R07.9  History:         Patient has prior history of Echocardiogram examinations, most                  recent 07/11/2019.  Sonographer:     Sherrie Sport Referring Phys:  [id number] AMY N COX Diagnosing Phys: Yolonda Kida MD IMPRESSIONS  1. Left ventricular ejection fraction, by estimation, is 25 to 30%. The left ventricle has severely decreased function. The left ventricle demonstrates global hypokinesis. The left ventricular internal cavity size was mildly to moderately dilated. There  is mild left ventricular hypertrophy. Left ventricular diastolic parameters are consistent with Grade III diastolic dysfunction (restrictive).  2. Right ventricular systolic function is moderately reduced. The right ventricular size is mildly enlarged.  3. Left atrial size was mildly dilated.  4. Right atrial size was mildly dilated.  5. The mitral valve is grossly normal. Mild mitral valve regurgitation.  6. The aortic valve is grossly normal. Aortic valve regurgitation is trivial. FINDINGS  Left Ventricle: Left ventricular ejection fraction, by estimation, is 25 to 30%. The left ventricle has severely decreased function. The left ventricle demonstrates global hypokinesis. The left ventricular internal cavity size was mildly to moderately dilated. There is mild left ventricular hypertrophy. Left ventricular diastolic parameters are consistent with Grade III diastolic dysfunction (restrictive). Right Ventricle: The right ventricular  size is mildly enlarged. No increase in right ventricular wall thickness. Right ventricular systolic function is moderately reduced. Left Atrium: Left atrial size was mildly dilated. Right Atrium: Right atrial size was mildly dilated. Pericardium: There is no evidence of pericardial effusion. Mitral Valve: The mitral valve is grossly normal. Mild mitral valve regurgitation. Tricuspid Valve: The tricuspid valve is normal in structure. Tricuspid valve regurgitation is mild. Aortic Valve: The aortic valve is grossly normal. Aortic valve regurgitation is trivial. Aortic valve mean gradient measures 8.0 mmHg. Aortic valve peak gradient measures 14.2 mmHg. Aortic valve area, by VTI measures 1.21 cm. Pulmonic  Valve: The pulmonic valve was grossly normal. Pulmonic valve regurgitation is not visualized. Aorta: The ascending aorta was not well visualized. IAS/Shunts: No atrial level shunt detected by color flow Doppler. Additional Comments: A device lead is visualized.  LEFT VENTRICLE PLAX 2D LVIDd:         5.30 cm      Diastology LVIDs:         4.80 cm      LV e' medial:    3.92 cm/s LV PW:         1.20 cm      LV E/e' medial:  35.2 LV IVS:        1.20 cm      LV e' lateral:   10.20 cm/s LVOT diam:     2.00 cm      LV E/e' lateral: 13.5 LV SV:         36 LV SV Index:   20 LVOT Area:     3.14 cm  LV Volumes (MOD) LV vol d, MOD A2C: 125.0 ml LV vol d, MOD A4C: 130.0 ml LV vol s, MOD A2C: 86.6 ml LV vol s, MOD A4C: 84.4 ml LV SV MOD A2C:     38.4 ml LV SV MOD A4C:     130.0 ml LV SV MOD BP:      44.2 ml RIGHT VENTRICLE RV Basal diam:  3.90 cm RV Mid diam:    3.50 cm RV S prime:     7.62 cm/s TAPSE (M-mode): 1.4 cm LEFT ATRIUM             Index        RIGHT ATRIUM           Index LA diam:        4.65 cm 2.63 cm/m   RA Area:     15.60 cm LA Vol (A2C):   75.8 ml 42.83 ml/m  RA Volume:   36.60 ml  20.68 ml/m LA Vol (A4C):   46.0 ml 25.99 ml/m LA Biplane Vol: 61.1 ml 34.53 ml/m  AORTIC VALVE AV Area (Vmax):    1.21 cm AV  Area (Vmean):   1.14 cm AV Area (VTI):     1.21 cm AV Vmax:           188.67 cm/s AV Vmean:          130.667 cm/s AV VTI:            0.297 m AV Peak Grad:      14.2 mmHg AV Mean Grad:      8.0 mmHg LVOT Vmax:         72.70 cm/s LVOT Vmean:        47.400 cm/s LVOT VTI:          0.114 m LVOT/AV VTI ratio: 0.38  AORTA Ao Root diam: 3.20 cm MITRAL VALVE                TRICUSPID VALVE MV Area (PHT): 8.43 cm     TR Peak grad:   30.0 mmHg MV Decel Time: 90 msec      TR Vmax:        274.00 cm/s MV E velocity: 138.00 cm/s MV A velocity: 59.70 cm/s   SHUNTS MV E/A ratio:  2.31         Systemic VTI:  0.11 m  Systemic Diam: 2.00 cm Yolonda Kida MD Electronically signed by Yolonda Kida MD Signature Date/Time: 06/14/2022/9:33:40 PM    Final    DG Chest 2 View  Result Date: 06/12/2022 CLINICAL DATA:  Chest pain. EXAM: CHEST - 2 VIEW COMPARISON:  September 09, 2021. FINDINGS: Mild cardiomegaly is noted. Status post coronary bypass graft. Minimal bilateral pleural effusions are noted. No consolidative process is noted. Bony thorax is unremarkable. IMPRESSION: Minimal bilateral pleural effusions. Status post coronary bypass graft. Electronically Signed   By: Marijo Conception M.D.   On: 06/12/2022 16:22    ECHO 25-30% with global hypokinesis  TELEMETRY reviewed by me (LT) 07/09/2022 : 60s with some periods of ventricular pacing, occasional PVCs  EKG reviewed by me: V paced rhythm, nonspecific diffuse T wave abnormalities, overall similar to prior from January 2024  Data reviewed by me (LT) 07/09/2022: nursing PT/OT notes CBC BMP vitals telemetry troponins BNP  Principal Problem:   Acute on chronic systolic CHF (congestive heart failure) ([place]) Active Problems:   Benign essential HTN   Cardiomyopathy, ischemic   Chronic pain syndrome   Atrial fibrillation (HCC)   Benign prostatic hyperplasia   Obstructive sleep apnea syndrome   Sick sinus syndrome ([place])   Acute kidney injury  superimposed on CKD ([place])   Metabolic acidosis   Hyponatremia   Hypokalemia   History of anemia due to chronic kidney disease    ASSESSMENT AND PLAN:  Leavy "Austin [name]" [name] is a 33yoM with a PMH of severe 3vCAD s/p CABG x 3 (hx NSTEMI 05/2021 medically managed), SSS s/p Micra Leadless PPM (07/2021), paroxysmal AF on eliquis, HFrEF (25-30%, g3DD, global hypo 06/2022), mild aortic stenosis, CKD 4, HLD who presented to [facility] ED 07/07/2022 with shortness of breath and fatigue.  BNP elevated to 2000 on admission, cardiology is consulted for assistance with CHF.  # Acute on chronic HFrEF (25-30%, G3 DD, global hypo-06/2022) BNP 2000 on admission, hypervolemic with dependent 2+ peripheral edema in his lower extremities, with crackles and a supplemental oxygen requirement.  Clinically improving after initial diuresis. -s/p IV Lasix 40 mg x 3 doses. Hold diuresis today with Cr bump. Recheck BMP tomorrow before further dosing.  -Continue GDMT with metoprolol tartrate 25 mg twice daily, Imdur 120 mg once daily, hydralazine 25 mg twice daily, no ARB, MRA, SGLT2i with renal insufficiency -Defer repeat echo -Strict I's/O  # Demand ischemia # Severe 3vCAD s/p CABG x 3   Troponin minimally elevated at 33, 35, in the setting of known renal insufficiency signs of ischemic EKG changes, this is most consistent with demand/supply mismatch and not ACS  -Continue aspirin, atorvastatin 40 mg daily  # CKD4 Improvement in BUN/creatinine, and GFR after initial diuresis, worsening today. 2.08 -- 2.72 and GFR 30 --21  # NSVT # SSS s/p Micra leadless PPM # Paroxysmal atrial fibrillation on apixaban In sinus rhythm on telemetry with occasional periods of pacing, multiple short runs of NSVT (3-5 beats) in the setting of hypokalemia from loop diuretic use, resolved after IV mag & PO Kcl repletion -Continue Eliquis 2.5 mg twice daily for stroke prevention (age >68, creatinine >1.5) -Monitor and replete electrolytes for a K >4,  mag >2  # Goals of care Interested in and appropriate for hospice services at discharge. Has severe 3v CAD unamenable to revascularization  +  now reduced EF with his second admission in <2 months.   This patient's plan of care was discussed and created with Dr. Clayborn Bigness and he is  in agreement.  Signed: Tristan Schroeder , PA-C 07/09/2022, 11:08 AM [facility]

## 2022-07-09 NOTE — Progress Notes (Signed)
[name] Nurse Liaison Note  New referral for hospice at home received from [place], TOC.  Met with patient and his son in law at the bedside to discuss Medicare hospice benefit and hospice philosophy.  Explanation give between hospice care and palliative medicine.  Patient in agreement with and request hospice services upon hospital discharge, which will likely be tomorrow.  DME in the home:  2 wheeled walker, cane and shower stool DME Needs:  none identified for discharge  Patient lives at home with his wife, who suffers from some memory problems.  Best contact is his daughter, [name]. [phone].  Referral sent to referral intake. Collaboration with hospital team, patient and [facility] ongoing. Please provide needed prescriptions at discharge that the patient will need for any symptom management or immediate needs.  Liaison team will follow through final disposition.  Thank you for allowing participation in this patient's care.  Austin Aguas, RN Nurse Liaison [phone]

## 2022-07-09 NOTE — TOC Initial Note (Signed)
Transition of Care [facility]) - Initial/Assessment Note    Patient Details  Name: Austin [name] MRN: [id number] Date of Birth: July 09, 1928  Transition of Care [facility]) CM/SW Contact:    Laurena Slimmer, RN Phone Number: 07/09/2022, 1:17 PM  Clinical Narrative:                 Per MD patient interested in hospice care.   Spoke with patient. He is agreeable to hospice referral. He stated he had been in touch with [facility] prior to this admission and would prefer them.  Referral sent to Doreatha Lew from [facility].           Patient Goals and CMS Choice            Expected Discharge Plan and Services                                              Prior Living Arrangements/Services                       Activities of Daily Living Home Assistive Devices/Equipment: Eyeglasses ADL Screening (condition at time of admission) Patient's cognitive ability adequate to safely complete daily activities?: Yes Is the patient deaf or have difficulty hearing?: No Does the patient have difficulty seeing, even when wearing glasses/contacts?: No Does the patient have difficulty concentrating, remembering, or making decisions?: No Patient able to express need for assistance with ADLs?: Yes Does the patient have difficulty dressing or bathing?: No Independently performs ADLs?: Yes (appropriate for developmental age) Does the patient have difficulty walking or climbing stairs?: No Weakness of Legs: Both Weakness of Arms/Hands: None  Permission Sought/Granted                  Emotional Assessment              Admission diagnosis:  Hyponatremia [E87.1] CHF exacerbation (HCC) [I50.9] HFrEF (heart failure with reduced ejection fraction) ([place]) [I50.20] Chest pain, unspecified type [R07.9] Acute on chronic congestive heart failure, unspecified heart failure type ([place]) [I50.9] Patient Active Problem List   Diagnosis Date Noted   Acute on chronic  systolic CHF (congestive heart failure) ([place]) 07/08/2022   Acute kidney injury superimposed on CKD ([place]) 56/21/3086   Metabolic acidosis 57/84/6962   Hyponatremia 07/08/2022   Hypokalemia 07/08/2022   History of anemia due to chronic kidney disease 07/08/2022   NSTEMI (non-ST elevated myocardial infarction) ([place]) 06/12/2022   Sick sinus syndrome ([place]) 08/27/2021   Angina pectoris ([place]) 05/08/2021   Mild aortic valve stenosis 12/10/2020   Atrial flutter, paroxysmal ([place]) 08/15/2020   Stage 3b chronic kidney disease ([place]) 04/19/2020   Stage 3b chronic kidney disease ([place]) 08/11/2019   Paroxysmal atrial fibrillation ([place]) 07/29/2019   Atrial fibrillation ([place]) 07/10/2019   Neurogenic claudication 03/10/2018   Cervical myelopathy ([place]) 12/30/2017   SOBOE (shortness of breath on exertion) 12/14/2017   Dyspnea on exertion 12/14/2017   Spinal stenosis, lumbar region, with neurogenic claudication 10/07/2017   Chronic lower extremity pain (Right) 08/25/2017   Spondylosis without myelopathy or radiculopathy, lumbosacral region 08/25/2017   Pain in lower limb 08/25/2017   DDD (degenerative disc disease), lumbar 08/13/2017   Status post right partial knee replacement 08/13/2017   Other specified postprocedural states 08/13/2017   Degeneration of lumbar intervertebral disc 08/13/2017   Chronic Lumbar radiculitis (intermittent/recurrent) (Bilateral)  01/29/2017   Easy bruising 01/14/2017   Bruises easily 01/14/2017   Lumbar central spinal stenosis (Severe at L4-5) 12/30/2016   Chronic pain syndrome 12/02/2016   Full thickness rotator cuff tear 11/10/2016   Stiffness of shoulder joint 11/10/2016   Weakness of limb 11/10/2016   Osteoarthritis of knee (Right) 11/10/2016   Stiffness of unspecified shoulder, not elsewhere classified 11/10/2016   Muscle weakness (generalized) 11/10/2016   Carotid stenosis 11/07/2016   Carotid artery stenosis 11/07/2016   Chronic knee pain (Right) 07/30/2016    Pain in unspecified knee 07/30/2016   Tear of medial meniscus of knee 07/16/2016   Ventricular premature beats 02/06/2016   Ventricular premature complex 02/06/2016   Peripheral vascular disease ([place]) 08/14/2015   Lumbar facet hypertrophy (multilevel) 08/14/2015   Grade 1 Anterolisthesis of L3 over L4 08/14/2015   Lumbar foraminal stenosis (Right L2-3; Bilateral L3-4, L4-5) 08/14/2015   Spondylolisthesis 08/14/2015   Arthropathy of lumbar facet joint 08/14/2015   Chronic low back pain (Primary Source of Pain) (Bilateral) (R>L) 08/13/2015   Lumbar spondylosis 08/13/2015   Lumbar facet syndrome (Bilateral) (R>L) 08/13/2015   Chronic low back pain 08/13/2015   Old myocardial infarction 03/26/2015   Coronary artery disease involving native coronary artery of native heart with unstable angina pectoris ([place]) 01/08/2015   Coronary atherosclerosis 01/08/2015   Benign fibroma of prostate 12/08/2014   Atherosclerosis of coronary artery 12/08/2014   Cataract 12/08/2014   Acid reflux 12/08/2014   Glaucoma 12/08/2014   Hemorrhoid 12/08/2014   HLD (hyperlipidemia) 12/08/2014   Obstructive apnea 12/08/2014   Borderline diabetes 12/08/2014   Heart valve disease 12/08/2014   Benign prostatic hyperplasia 12/08/2014   Prediabetes 12/08/2014   Glaucoma 12/08/2014   Heart valve disorder 12/08/2014   Hyperlipidemia 12/08/2014   Obstructive sleep apnea syndrome 12/08/2014   MI (mitral incompetence) 10/03/2014   Cardiomyopathy, ischemic 10/03/2014   Ischemic myocardial dysfunction 10/03/2014   Ischemic cardiomyopathy 10/03/2014   Moderate mitral valve regurgitation 10/03/2014   History of colonic polyps 10/02/2014   Personal history of colonic polyps 10/02/2014   Benign essential HTN 09/20/2014   Benign essential hypertension 09/20/2014   PCP:  Eulas Post, MD Pharmacy:   [place], [place] - [address] [place] [address] Phone: [phone]  Fax: [phone]     Social Determinants of Health (SDOH) Social History: SDOH Screenings   Food Insecurity: No Food Insecurity (07/08/2022)  Housing: Low Risk  (07/08/2022)  Transportation Needs: No Transportation Needs (07/08/2022)  Utilities: Not At Risk (07/08/2022)  Alcohol Screen: Low Risk  (06/05/2022)  Depression (PHQ2-9): Low Risk  (06/05/2022)  Financial Resource Strain: Low Risk  (06/05/2022)  Physical Activity: Sufficiently Active (06/05/2022)  Social Connections: Moderately Integrated (06/05/2022)  Stress: No Stress Concern Present (06/05/2022)  Tobacco Use: Medium Risk (06/13/2022)   SDOH Interventions:     Readmission Risk Interventions     No data to display

## 2022-07-10 DIAGNOSIS — I5023 Acute on chronic systolic (congestive) heart failure: Secondary | ICD-10-CM | POA: Diagnosis not present

## 2022-07-10 LAB — BASIC METABOLIC PANEL
Anion gap: 11 (ref 5–15)
BUN: 55 mg/dL — ABNORMAL HIGH (ref 8–23)
CO2: 17 mmol/L — ABNORMAL LOW (ref 22–32)
Calcium: 8.2 mg/dL — ABNORMAL LOW (ref 8.9–10.3)
Chloride: 99 mmol/L (ref 98–111)
Creatinine, Ser: 2.97 mg/dL — ABNORMAL HIGH (ref 0.61–1.24)
GFR, Estimated: 19 mL/min — ABNORMAL LOW (ref >60)
Glucose, Bld: 99 mg/dL (ref 70–99)
Potassium: 5.1 mmol/L (ref 3.5–5.1)
Sodium: 127 mmol/L — ABNORMAL LOW (ref 135–145)

## 2022-07-10 LAB — CBC WITH DIFFERENTIAL/PLATELET
Abs Immature Granulocytes: 0.02 10*3/uL (ref 0.00–0.07)
Basophils Absolute: 0 10*3/uL (ref 0.0–0.1)
Basophils Relative: 0 %
Eosinophils Absolute: 0.1 10*3/uL (ref 0.0–0.5)
Eosinophils Relative: 1 %
HCT: 32.9 % — ABNORMAL LOW (ref 39.0–52.0)
Hemoglobin: 11 g/dL — ABNORMAL LOW (ref 13.0–17.0)
Immature Granulocytes: 0 %
Lymphocytes Relative: 8 %
Lymphs Abs: 0.6 10*3/uL — ABNORMAL LOW (ref 0.7–4.0)
MCH: 30 pg (ref 26.0–34.0)
MCHC: 33.4 g/dL (ref 30.0–36.0)
MCV: 89.6 fL (ref 80.0–100.0)
Monocytes Absolute: 0.6 10*3/uL (ref 0.1–1.0)
Monocytes Relative: 8 %
Neutro Abs: 6.2 10*3/uL (ref 1.7–7.7)
Neutrophils Relative %: 83 %
Platelets: 117 10*3/uL — ABNORMAL LOW (ref 150–400)
RBC: 3.67 MIL/uL — ABNORMAL LOW (ref 4.22–5.81)
RDW: 14.7 % (ref 11.5–15.5)
WBC: 7.6 10*3/uL (ref 4.0–10.5)
nRBC: 0 % (ref 0.0–0.2)

## 2022-07-10 LAB — URINALYSIS, COMPLETE (UACMP) WITH MICROSCOPIC
Bilirubin Urine: NEGATIVE
Glucose, UA: NEGATIVE mg/dL
Hgb urine dipstick: NEGATIVE
Ketones, ur: NEGATIVE mg/dL
Leukocytes,Ua: NEGATIVE
Nitrite: NEGATIVE
Protein, ur: 30 mg/dL — AB
Specific Gravity, Urine: 1.016 (ref 1.005–1.030)
pH: 5 (ref 5.0–8.0)

## 2022-07-10 MED ORDER — SODIUM CHLORIDE 0.9 % IV SOLN: INTRAVENOUS | Status: AC

## 2022-07-10 NOTE — Progress Notes (Addendum)
Patient c/o IV bleeding to his right AC. 18 gauge noted to be bleeding. Patient IV removed with several gauze. Had to hold for 5+ mins due to easy bleed. Held arm up and applied pressure until no more bleeding. Coban applied with transparent dressing and 4x4 gauze on. Requested that patient tries not to move arm but keep elevated on a pillow. Before Coban and transparent dressing was applied, site was no longer bleeding. Will recheck in 10 mins.   1660- No bleeding at site. Unwrapped coban and 4x4 dressing dry, intact, rewrapped with coban looser and advised patient to let us know if it bleeds again.

## 2022-07-10 NOTE — Progress Notes (Signed)
PROGRESS NOTE    Austin [name]  YIR:[id number] DOB: 06-30-1928 DOA: 07/07/2022 PCP: Eulas Post, MD    Brief Narrative:   89 y.o. male with medical history significant for paroxysmal a fib, htn, bph, cad, hfref, osa, sick sinus s/p pacer, ckd4, who presents with the above.   Recent admit for nstemi, treated medically.   Since discharge hasn't felt back to normal. Has had worsening fatigue, lack of energy. Also slowly worsening shortness of breath, mainly with exertion. No chest pain but gets occasional epigastric burning sensation. Also with intermittent nausea, no vomiting. Occasional hard stools mixed with loose/liquid stools. No fevers. Home nurse came by today and advised EMS given these symptoms. EMS placed him on 2 L after noting "cyanosis" on his lips.    2/6.  Continue IV diuresis.  Physical therapy consultation.  Cardiology consultation. 2/7: Creatinine worsened today.  Patient feels symptomatically improved.  Interested in engaging hospice services.  TOC and hospice liaison involved.  3/8: Altered mentation and visual hallucinations noted this morning.  Patient is aware that he is hallucinating  Assessment & Plan:   Principal Problem:   Acute on chronic systolic CHF (congestive heart failure) (HCC) Active Problems:   Cardiomyopathy, ischemic   Acute kidney injury superimposed on CKD (HCC)   Metabolic acidosis   Chronic pain syndrome   Benign essential HTN   Atrial fibrillation (HCC)   Benign prostatic hyperplasia   Obstructive sleep apnea syndrome   Sick sinus syndrome (HCC)   Hyponatremia   Hypokalemia   History of anemia due to chronic kidney disease  Altered mental status Unclear etiology.  Differentials include uremia versus hospital-acquired delirium versus UTI.  UTI less likely given lack of leukocytosis.  Urinalysis pending. Plan: Follow-up UA Frequent reorienting measures Delirium precautions Holding diuretic  Acute on chronic systolic CHF  (congestive heart failure) (HCC) IV Lasix on hold currently in the setting of deteriorating kidney function   Cardiomyopathy, ischemic Medical management last time with NSTEMI.  Currently on aspirin metoprolol and atorvastatin, Imdur and Ranexa.   Acute kidney injury superimposed on CKD ([place]) Acute kidney injury on CKD stage IV.  Creatinine 2.56 on presentation.  Down to 2.03, now back up to 2. 2.9.  Diuretics held last 2 days   Metabolic acidosis From chronic kidney disease.  sodium bicarb tablets twice daily.   History of anemia due to chronic kidney disease Hemoglobin stable.  No indication for transfusion   Hypokalemia Monitor and replace as needed   Hyponatremia Likely secondary to CHF   Benign prostatic hyperplasia Continue Flomax.   Atrial fibrillation (HCC) Paroxysmal in nature on metoprolol and Eliquis.   Benign essential HTN Continue metoprolol.  DVT prophylaxis: Eliquis Code Status: DNR Family Communication: Left VM for daughter Herbert Spires [phone] on 2/8 Disposition Plan: Status is: Inpatient Remains inpatient appropriate because: Decompensated systolic heart failure   Level of care: Telemetry Cardiac  Consultants:  Cardiology  Procedures:  None  Antimicrobials: None   Subjective: Seen and examined.  Answers questions reasonably well however notes he has visual hallucinations.  Objective: Vitals:   07/10/22 0420 07/10/22 0753 07/10/22 0935 07/10/22 0942  BP: 103/66 (!) 119/58  119/63  Pulse: 79 64  64  Resp: 18 14    Temp: 97.7 F (36.5 C) 97.8 F (36.6 C)    TempSrc: Oral     SpO2: 99% 100%  99%  Weight:   68.3 kg   Height:        Intake/Output Summary (Last  24 hours) at 07/10/2022 1212 Last data filed at 07/09/2022 2200 Gross per 24 hour  Intake --  Output 150 ml  Net -150 ml   Filed Weights   07/07/22 1240 07/09/22 0900 07/10/22 0935  Weight: 68 kg 67.4 kg 68.3 kg    Examination:  General exam: Appears mildly  anxious Respiratory system: Lung sounds to bases.  No wheezing.  Normal work of breathing.  2 L Cardiovascular system: S1-S2, RRR, no murmurs, 2+ pedal edema Gastrointestinal system: Soft, NT/ND, normal bowel sounds. Central nervous system: Alert.  Oriented x 2.  Reports visual hallucination Extremities: Symmetric 5 x 5 power. Skin: No rashes, lesions or ulcers Psychiatry: Judgement and insight appear normal. Mood & affect appropriate.     Data Reviewed: I have personally reviewed following labs and imaging studies  CBC: Recent Labs  Lab 07/07/22 1231 07/08/22 0410 07/09/22 0607 07/10/22 0247  WBC 7.7 7.1 7.3 7.6  NEUTROABS 6.7  --   --  6.2  HGB 11.3* 10.0* 10.9* 11.0*  HCT 34.7* 30.3* 32.9* 32.9*  MCV 91.3 91.0 89.2 89.6  PLT 108* 96* 111* 675*   Basic Metabolic Panel: Recent Labs  Lab 07/07/22 1231 07/08/22 0410 07/09/22 0607 07/10/22 0247  NA 124* 129* 127* 127*  K 4.9 3.2* 4.6 5.1  CL 95* 104 99 99  CO2 19* 16* 17* 17*  GLUCOSE 132* 82 83 99  BUN 48* 40* 48* 55*  CREATININE 2.56* 2.03* 2.72* 2.97*  CALCIUM 8.2* 6.6* 8.1* 8.2*   GFR: Estimated Creatinine Clearance: 14.6 mL/min (A) (by C-G formula based on SCr of 2.97 mg/dL (H)). Liver Function Tests: Recent Labs  Lab 07/07/22 1231  AST 29  ALT 22  ALKPHOS 129*  BILITOT 1.8*  PROT 6.3*  ALBUMIN 3.5   No results for input(s): "LIPASE", "AMYLASE" in the last 168 hours. No results for input(s): "AMMONIA" in the last 168 hours. Coagulation Profile: No results for input(s): "INR", "PROTIME" in the last 168 hours. Cardiac Enzymes: No results for input(s): "CKTOTAL", "CKMB", "CKMBINDEX", "TROPONINI" in the last 168 hours. BNP (last 3 results) No results for input(s): "PROBNP" in the last 8760 hours. HbA1C: No results for input(s): "HGBA1C" in the last 72 hours. CBG: Recent Labs  Lab 07/09/22 1938  GLUCAP 107*   Lipid Profile: No results for input(s): "CHOL", "HDL", "LDLCALC", "TRIG", "CHOLHDL",  "LDLDIRECT" in the last 72 hours. Thyroid Function Tests: No results for input(s): "TSH", "T4TOTAL", "FREET4", "T3FREE", "THYROIDAB" in the last 72 hours. Anemia Panel: Recent Labs    07/09/22 0607  FERRITIN 109   Sepsis Labs: No results for input(s): "PROCALCITON", "LATICACIDVEN" in the last 168 hours.  No results found for this or any previous visit (from the past 240 hour(s)).       Radiology Studies: No results found.      Scheduled Meds:  apixaban  2.5 mg Oral BID   aspirin  81 mg Oral Daily   atorvastatin  40 mg Oral Daily   dorzolamide-timolol  1 drop Both Eyes BID   hydrALAZINE  25 mg Oral BID   isosorbide mononitrate  120 mg Oral Daily   latanoprost  1 drop Both Eyes QHS   metoprolol tartrate  25 mg Oral BID   oxymetazoline  1 spray Each Nare BID   pantoprazole  40 mg Oral Daily   potassium chloride  20 mEq Oral BID   ranolazine  500 mg Oral BID   sodium bicarbonate  1,300 mg Oral BID   sodium  chloride flush  3 mL Intravenous Q12H   tamsulosin  0.4 mg Oral Daily   Continuous Infusions:  sodium chloride     sodium chloride 40 mL/hr at 07/10/22 1059     LOS: 3 days      Sidney Ace, MD [facility]   If 7PM-7AM, please contact night-coverage  07/10/2022, 12:12 PM

## 2022-07-10 NOTE — Progress Notes (Addendum)
[name]       Patient ID: Austin [name] MRN: [id number] DOB/AGE: [birth year] 89 y.o.  Admit date: 07/07/2022 Referring Physician Dr. Leslye Peer Primary Physician Dr. Miguel Aschoff Primary Cardiologist Dr. Nehemiah Massed  Reason for Consultation AoCHF  HPI: Austin [name] is a 89yoM with a PMH of severe 3vCAD s/p CABG x 3 (hx NSTEMI 05/2021 medically managed), SSS s/p Micra Leadless PPM (07/2021), paroxysmal AF on eliquis, HFrEF (25-30%, g3DD, global hypo 06/2022), mild aortic stenosis, CKD 4, HLD who presented to [facility] ED 07/07/2022 with shortness of breath and fatigue.  BNP elevated to 2000 on admission, cardiology is consulted for assistance with CHF. Clinical improvement following IV diuresis, unfortunately worsening renal function as a result + confusion on hospital day 3, workup ongoing.   Interval History: - noted hallucinations today, seeing people in room, room appearing sideways. Normally alert, oriented and a great historian.  - not eating or drinking much per daughter at bedside.  - he denies shortness of breath, no worsening chest discomfort - renal function continues to worsen, diuretics held again today   Review of systems complete and found to be negative unless listed above     Past Medical History:  Diagnosis Date   Absolute anemia 12/08/2014   Allergy    Anginal pain ([place])    much less angina d/t lack of activity   Arthritis    all over body   Atrial fibrillation ([place])    Bradycardia 05/03/2015   Calculus of kidney 12/08/2014   Coronary artery disease    GERD (gastroesophageal reflux disease)    Glaucoma    History of kidney stones    HLD (hyperlipidemia)    Hyperglycemia 08/27/2015   Hypertension    Macular degeneration    Myocardial infarction [facility]) 2011   Peripheral vascular disease ([place])    VHD (valvular heart disease)     Past Surgical History:  Procedure Laterality Date   ANTERIOR CERVICAL DECOMP/DISCECTOMY FUSION N/A  12/30/2017   Procedure: ANTERIOR CERVICAL DECOMPRESSION/DISCECTOMY FUSION 3 LEVELS- C3-6;  Surgeon: Meade Maw, MD;  Location: [facility] ORS;  Service: Neurosurgery;  Laterality: N/A;   CARDIAC CATHETERIZATION  2007   CARDIAC CATHETERIZATION N/A 04/17/2015   Procedure: Left Heart Cath and Coronary Angiography;  Surgeon: Corey Skains, MD;  Location: [place] CV LAB;  Service: Cardiovascular;  Laterality: N/A;   CARPAL TUNNEL RELEASE Bilateral    right 06/09/08 left 05/03/09   CATARACT EXTRACTION W/ INTRAOCULAR LENS  IMPLANT, BILATERAL     COLONOSCOPY N/A 10/02/2014   Procedure: COLONOSCOPY;  Surgeon: Manya Silvas, MD;  Location: [facility] ENDOSCOPY;  Service: Endoscopy;  Laterality: N/A;   CORONARY ARTERY BYPASS GRAFT  2007   ESOPHAGOGASTRODUODENOSCOPY     EYE SURGERY     HEMORROIDECTOMY     JOINT REPLACEMENT Right 03/2017   partial knee replacement   KNEE ARTHROSCOPY Right 08/05/2016   Procedure: ARTHROSCOPY KNEE;  Surgeon: Thornton Park, MD;  Location: [facility] ORS;  Service: Orthopedics;  Laterality: Right;   KNEE ARTHROSCOPY WITH LATERAL RELEASE Right 08/05/2016   Procedure: KNEE ARTHROSCOPY WITH LATERAL RELEASE;  Surgeon: Thornton Park, MD;  Location: [facility] ORS;  Service: Orthopedics;  Laterality: Right;   KNEE ARTHROSCOPY WITH MEDIAL MENISECTOMY Right 08/05/2016   Procedure: KNEE ARTHROSCOPY WITH MEDIAL MENISECTOMY;  Surgeon: Thornton Park, MD;  Location: [facility] ORS;  Service: Orthopedics;  Laterality: Right;  Partial   LEFT HEART CATH AND CORONARY ANGIOGRAPHY Left 05/16/2021   Procedure: LEFT HEART CATH  AND CORONARY ANGIOGRAPHY;  Surgeon: Corey Skains, MD;  Location: [place] [facility] LAB;  Service: Cardiovascular;  Laterality: Left;   LUMBAR EPIDURAL INJECTION  2011   LUMBAR LAMINECTOMY/DECOMPRESSION MICRODISCECTOMY N/A 03/10/2018   Procedure: LUMBAR LAMINECTOMY/DECOMPRESSION MICRODISCECTOMY 3 LEVELS-L2-5;  Surgeon: Meade Maw, MD;  Location: [facility] ORS;  Service:  Neurosurgery;  Laterality: N/A;   MOUTH SURGERY     tooth implant   PACEMAKER LEADLESS INSERTION N/A 08/27/2021   Procedure: PACEMAKER LEADLESS INSERTION;  Surgeon: Isaias Cowman, MD;  Location: [place] [facility] LAB;  Service: Cardiovascular;  Laterality: N/A;   SHOULDER SURGERY Right 2011   rotator cuff repair   SYNOVECTOMY Right 08/05/2016   Procedure: SYNOVECTOMY;  Surgeon: Thornton Park, MD;  Location: [facility] ORS;  Service: Orthopedics;  Laterality: Right;  arthroscopic extensive synovectomy   TONSILLECTOMY      Medications Prior to Admission  Medication Sig Dispense Refill Last Dose   apixaban (ELIQUIS) 2.5 MG TABS tablet Take 1 tablet (2.5 mg total) by mouth 2 (two) times daily. 60 tablet 0 07/07/2022   atorvastatin (LIPITOR) 40 MG tablet Take 1 tablet (40 mg total) by mouth daily. 30 tablet 2 07/07/2022   Biotin 5000 MCG CAPS Take 5,000 mcg by mouth daily.    Past Week   Cholecalciferol (VITAMIN D3) 2000 UNITS capsule Take 2,000 Units by mouth 2 (two) times daily.    Past Week   Coenzyme Q10 (COQ-10 PO) Take 200 mg by mouth daily.   Past Week   Cyanocobalamin (VITAMIN B-12) 5000 MCG SUBL Place 5,000 mcg under the tongue daily.   Past Week   dorzolamide-timolol (COSOPT) 22.3-6.8 MG/ML ophthalmic solution Place 1 drop into both eyes 2 (two) times daily.    07/07/2022   hydrALAZINE (APRESOLINE) 25 MG tablet Take 1 tablet (25 mg total) by mouth 2 (two) times daily. 60 tablet 2 07/07/2022   isosorbide mononitrate (IMDUR) 120 MG 24 hr tablet Take 1 tablet (120 mg total) by mouth daily. 30 tablet 2 07/07/2022   latanoprost (XALATAN) 0.005 % ophthalmic solution Place 1 drop into both eyes at bedtime.   07/06/2022   Melatonin 5 MG CAPS Take 5 mg by mouth at bedtime as needed (for sleep).    07/06/2022   metoprolol tartrate (LOPRESSOR) 50 MG tablet Take 0.5 tablets (25 mg total) by mouth 2 (two) times daily. 30 tablet 1 07/07/2022   nitroGLYCERIN (NITROSTAT) 0.4 MG SL tablet Place 0.4 mg under the tongue  every 5 (five) minutes as needed for chest pain.    07/06/2022   omeprazole (PRILOSEC) 20 MG capsule Take 20 mg by mouth daily.    07/07/2022   Polyethyl Glycol-Propyl Glycol (SYSTANE OP) Place 1 drop into the right eye daily as needed (burning).   07/06/2022   ranolazine (RANEXA) 500 MG 12 hr tablet Take 1 tablet by mouth 2 (two) times daily.   07/07/2022   tamsulosin (FLOMAX) 0.4 MG CAPS capsule TAKE 1 CAPSULE BY MOUTH ONCE DAILY 90 capsule 0 07/06/2022   TURMERIC PO Take 1,000 mg by mouth daily.   Past Week   aspirin 81 MG chewable tablet Chew 1 tablet (81 mg total) by mouth daily. (Patient not taking: Reported on 07/07/2022) 30 tablet 2 Not Taking   ranolazine (RANEXA) 1000 MG SR tablet Take 1 tablet (1,000 mg total) by mouth 2 (two) times daily. (Patient not taking: Reported on 07/07/2022) 60 tablet 2 Not Taking   torsemide (DEMADEX) 20 MG tablet Take 10 mg by mouth daily. (Patient not  taking: Reported on 07/07/2022)   Not Taking    Social History   Socioeconomic History   Marital status: Married    Spouse name: Blanch Media   Number of children: 1   Years of education: Not on file   Highest education level: Some college, no degree  Occupational History   Occupation: Engineer, water: RETIRED  Tobacco Use   Smoking status: Former    Years: 15.00    Types: Cigarettes    Quit date: 1980    Years since quitting: 44.1   Smokeless tobacco: Never  Vaping Use   Vaping Use: Never used  Substance and Sexual Activity   Alcohol use: Yes    Alcohol/week: 3.0 standard drinks of alcohol    Types: 3 Glasses of wine per week   Drug use: No   Sexual activity: Not Currently  Other Topics Concern   Not on file  Social History Narrative   Not on file   Social Determinants of Health   Financial Resource Strain: Low Risk  (06/05/2022)   Overall Financial Resource Strain (CARDIA)    Difficulty of Paying Living Expenses: Not hard at all  Food Insecurity: No Food Insecurity (07/08/2022)    Hunger Vital Sign    Worried About Running Out of Food in the Last Year: Never true    Ran Out of Food in the Last Year: Never true  Transportation Needs: No Transportation Needs (07/08/2022)   PRAPARE - Hydrologist (Medical): No    Lack of Transportation (Non-Medical): No  Physical Activity: Sufficiently Active (06/05/2022)   Exercise Vital Sign    Days of Exercise per Week: 4 days    Minutes of Exercise per Session: 40 min  Stress: No Stress Concern Present (06/05/2022)   [place]    Feeling of Stress : Not at all  Social Connections: Moderately Integrated (06/05/2022)   Social Connection and Isolation Panel [NHANES]    Frequency of Communication with Friends and Family: More than three times a week    Frequency of Social Gatherings with Friends and Family: Twice a week    Attends Religious Services: More than 4 times per year    Active Member of [employer] or Organizations: No    Attends Archivist Meetings: Never    Marital Status: Married  Human resources officer Violence: Not At Risk (07/08/2022)   Humiliation, Afraid, Rape, and Kick questionnaire    Fear of Current or Ex-Partner: No    Emotionally Abused: No    Physically Abused: No    Sexually Abused: No    Family History  Problem Relation Age of Onset   Heart disease Mother    Heart disease Father    Heart disease Brother    Heart disease Brother    Heart disease Brother    Heart disease Brother    Heart disease Brother       Intake/Output Summary (Last 24 hours) at 07/10/2022 1153 Last data filed at 07/09/2022 2200 Gross per 24 hour  Intake --  Output 350 ml  Net -350 ml     Vitals:   07/10/22 0420 07/10/22 0753 07/10/22 0935 07/10/22 0942  BP: 103/66 (!) 119/58  119/63  Pulse: 79 64  64  Resp: 18 14    Temp: 97.7 F (36.5 C) 97.8 F (36.6 C)    TempSrc: Oral     SpO2: 99% 100%  99%  Weight:   68.3 kg   Height:         PHYSICAL EXAM General: Elderly and frail Caucasian male, in no acute distress.  Sitting upright in PCU bed with robe draped across shoulders, daughter at bedside HEENT:  Normocephalic and atraumatic. Neck:  No JVD.  Lungs: Normal respiratory effort on room air. Clear to auscultation bilaterally heart: paced rhythm . Normal S1 and S2 without gallops or murmurs.  Abdomen: Non-distended appearing.  Msk: Normal strength and tone for age. Extremities: Warm and well perfused. No clubbing, cyanosis.  No peripheral edema Neuro: Awake and oriented to self and hospital, but later comments how he didn't know PA's made house calls  Psych:  pleasantly disoriented  Labs: Basic Metabolic Panel: Recent Labs    07/09/22 0607 07/10/22 0247  NA 127* 127*  K 4.6 5.1  CL 99 99  CO2 17* 17*  GLUCOSE 83 99  BUN 48* 55*  CREATININE 2.72* 2.97*  CALCIUM 8.1* 8.2*    Liver Function Tests: Recent Labs    07/07/22 1231  AST 29  ALT 22  ALKPHOS 129*  BILITOT 1.8*  PROT 6.3*  ALBUMIN 3.5    No results for input(s): "LIPASE", "AMYLASE" in the last 72 hours. CBC: Recent Labs    07/07/22 1231 07/08/22 0410 07/09/22 0607 07/10/22 0247  WBC 7.7   < > 7.3 7.6  NEUTROABS 6.7  --   --  6.2  HGB 11.3*   < > 10.9* 11.0*  HCT 34.7*   < > 32.9* 32.9*  MCV 91.3   < > 89.2 89.6  PLT 108*   < > 111* 117*   < > = values in this interval not displayed.    Cardiac Enzymes: Recent Labs    07/07/22 1231 07/07/22 1457  TROPONINIHS 33* 35*    BNP: Recent Labs    07/07/22 1231  BNP 1,999.8*    D-Dimer: No results for input(s): "DDIMER" in the last 72 hours. Hemoglobin A1C: No results for input(s): "HGBA1C" in the last 72 hours. Fasting Lipid Panel: No results for input(s): "CHOL", "HDL", "LDLCALC", "TRIG", "CHOLHDL", "LDLDIRECT" in the last 72 hours. Thyroid Function Tests: No results for input(s): "TSH", "T4TOTAL", "T3FREE", "THYROIDAB" in the last 72 hours.  Invalid input(s):  "FREET3" Anemia Panel: Recent Labs    07/09/22 0607  FERRITIN 109      Radiology: US ABDOMEN LIMITED RUQ (LIVER/GB)  Result Date: 07/07/2022 CLINICAL DATA:  Nausea.  Elevated bilirubin. EXAM: ULTRASOUND ABDOMEN LIMITED RIGHT UPPER QUADRANT COMPARISON:  None Available. FINDINGS: Gallbladder: The gallbladder is thick walled measuring 3.6 mm. The gallbladder is otherwise normal. Common bile duct: Diameter: 3.1 mm Liver: Nodular contour. No mass. Diffuse increased heterogeneous echogenicity. Portal vein is patent on color Doppler imaging with normal direction of blood flow towards the liver. Other: Mild perihepatic ascites. IMPRESSION: 1. Cirrhotic morphology in the liver with no mass. Mild perihepatic ascites. 2. The gallbladder wall is mildly thickened measuring 3.6 mm. The gallbladder is otherwise normal in appearance. Electronically Signed   By: Dorise Bullion III M.D.   On: 07/07/2022 16:56   DG Abd 1 View  Result Date: 07/07/2022 CLINICAL DATA:  Burning chest pain and shortness of breath, nausea, bilirubinemia. EXAM: ABDOMEN - 1 VIEW COMPARISON:  Plain film of the abdomen dated 04/02/2010. FINDINGS: t visualized bowel gas pattern is nonobstructive. No evidence of soft tissue mass or abnormal fluid collection. No evidence of free intraperitoneal air. No evidence of renal or ureteral calculi. No  acute-appearing osseous abnormality. Extensive vascular calcifications. IMPRESSION: 1. No acute findings. Nonobstructive bowel gas pattern. 2. Extensive vascular calcifications. Electronically Signed   By: Franki Cabot M.D.   On: 07/07/2022 16:00   DG Chest Portable 1 View  Result Date: 07/07/2022 CLINICAL DATA:  Chest pain EXAM: PORTABLE CHEST - 1 VIEW COMPARISON:  06/12/2022 FINDINGS: Heart is mildly enlarged. Mild pulmonary vascular congestion. Postsurgical changes of CABG and ACDF are seen. Interval development of patchy bibasilar airspace opacities and minimal pleural effusions. Degenerative changes of  the shoulders again seen. Surgical anchors noted in the right humeral head. IMPRESSION: Interval development of mild cardiomegaly, pulmonary vascular congestion, bibasilar opacities and small pleural effusions, consistent with CHF. Electronically Signed   By: Miachel Roux M.D.   On: 07/07/2022 12:55   ECHOCARDIOGRAM COMPLETE  Result Date: 06/14/2022    ECHOCARDIOGRAM REPORT   Patient Name:   NEEL BUFFONE Date of Exam: 06/13/2022 Medical Rec #:  [id number]        Height:       66.0 in Accession #:    [id number]       Weight:       150.0 lb Date of Birth:  12-19-1928         BSA:          1.770 m Patient Age:    47 years         BP:           135/81 mmHg Patient Gender: M                HR:           71 bpm. Exam Location:  [facility] Procedure: 2D Echo, Cardiac Doppler and Color Doppler Indications:     Elevated troponin                  Dyspnea R06.00                  Chest pain R07.9  History:         Patient has prior history of Echocardiogram examinations, most                  recent 07/11/2019.  Sonographer:     Sherrie Sport Referring Phys:  [id number] AMY N COX Diagnosing Phys: Yolonda Kida MD IMPRESSIONS  1. Left ventricular ejection fraction, by estimation, is 25 to 30%. The left ventricle has severely decreased function. The left ventricle demonstrates global hypokinesis. The left ventricular internal cavity size was mildly to moderately dilated. There  is mild left ventricular hypertrophy. Left ventricular diastolic parameters are consistent with Grade III diastolic dysfunction (restrictive).  2. Right ventricular systolic function is moderately reduced. The right ventricular size is mildly enlarged.  3. Left atrial size was mildly dilated.  4. Right atrial size was mildly dilated.  5. The mitral valve is grossly normal. Mild mitral valve regurgitation.  6. The aortic valve is grossly normal. Aortic valve regurgitation is trivial. FINDINGS  Left Ventricle: Left ventricular ejection fraction, by estimation, is  25 to 30%. The left ventricle has severely decreased function. The left ventricle demonstrates global hypokinesis. The left ventricular internal cavity size was mildly to moderately dilated. There is mild left ventricular hypertrophy. Left ventricular diastolic parameters are consistent with Grade III diastolic dysfunction (restrictive). Right Ventricle: The right ventricular size is mildly enlarged. No increase in right ventricular wall thickness. Right ventricular systolic function is moderately reduced. Left Atrium: Left atrial size  was mildly dilated. Right Atrium: Right atrial size was mildly dilated. Pericardium: There is no evidence of pericardial effusion. Mitral Valve: The mitral valve is grossly normal. Mild mitral valve regurgitation. Tricuspid Valve: The tricuspid valve is normal in structure. Tricuspid valve regurgitation is mild. Aortic Valve: The aortic valve is grossly normal. Aortic valve regurgitation is trivial. Aortic valve mean gradient measures 8.0 mmHg. Aortic valve peak gradient measures 14.2 mmHg. Aortic valve area, by VTI measures 1.21 cm. Pulmonic Valve: The pulmonic valve was grossly normal. Pulmonic valve regurgitation is not visualized. Aorta: The ascending aorta was not well visualized. IAS/Shunts: No atrial level shunt detected by color flow Doppler. Additional Comments: A device lead is visualized.  LEFT VENTRICLE PLAX 2D LVIDd:         5.30 cm      Diastology LVIDs:         4.80 cm      LV e' medial:    3.92 cm/s LV PW:         1.20 cm      LV E/e' medial:  35.2 LV IVS:        1.20 cm      LV e' lateral:   10.20 cm/s LVOT diam:     2.00 cm      LV E/e' lateral: 13.5 LV SV:         36 LV SV Index:   20 LVOT Area:     3.14 cm  LV Volumes (MOD) LV vol d, MOD A2C: 125.0 ml LV vol d, MOD A4C: 130.0 ml LV vol s, MOD A2C: 86.6 ml LV vol s, MOD A4C: 84.4 ml LV SV MOD A2C:     38.4 ml LV SV MOD A4C:     130.0 ml LV SV MOD BP:      44.2 ml RIGHT VENTRICLE RV Basal diam:  3.90 cm RV Mid  diam:    3.50 cm RV S prime:     7.62 cm/s TAPSE (M-mode): 1.4 cm LEFT ATRIUM             Index        RIGHT ATRIUM           Index LA diam:        4.65 cm 2.63 cm/m   RA Area:     15.60 cm LA Vol (A2C):   75.8 ml 42.83 ml/m  RA Volume:   36.60 ml  20.68 ml/m LA Vol (A4C):   46.0 ml 25.99 ml/m LA Biplane Vol: 61.1 ml 34.53 ml/m  AORTIC VALVE AV Area (Vmax):    1.21 cm AV Area (Vmean):   1.14 cm AV Area (VTI):     1.21 cm AV Vmax:           188.67 cm/s AV Vmean:          130.667 cm/s AV VTI:            0.297 m AV Peak Grad:      14.2 mmHg AV Mean Grad:      8.0 mmHg LVOT Vmax:         72.70 cm/s LVOT Vmean:        47.400 cm/s LVOT VTI:          0.114 m LVOT/AV VTI ratio: 0.38  AORTA Ao Root diam: 3.20 cm MITRAL VALVE                TRICUSPID VALVE MV Area (PHT): 8.43 cm  TR Peak grad:   30.0 mmHg MV Decel Time: 90 msec      TR Vmax:        274.00 cm/s MV E velocity: 138.00 cm/s MV A velocity: 59.70 cm/s   SHUNTS MV E/A ratio:  2.31         Systemic VTI:  0.11 m                             Systemic Diam: 2.00 cm Yolonda Kida MD Electronically signed by Yolonda Kida MD Signature Date/Time: 06/14/2022/9:33:40 PM    Final    DG Chest 2 View  Result Date: 06/12/2022 CLINICAL DATA:  Chest pain. EXAM: CHEST - 2 VIEW COMPARISON:  September 09, 2021. FINDINGS: Mild cardiomegaly is noted. Status post coronary bypass graft. Minimal bilateral pleural effusions are noted. No consolidative process is noted. Bony thorax is unremarkable. IMPRESSION: Minimal bilateral pleural effusions. Status post coronary bypass graft. Electronically Signed   By: Marijo Conception M.D.   On: 06/12/2022 16:22    ECHO 25-30% with global hypokinesis  TELEMETRY reviewed by me (LT) 07/10/2022 : 60s with some periods of ventricular pacing, occasional PVCs  EKG reviewed by me: V paced rhythm, nonspecific diffuse T wave abnormalities, overall similar to prior from January 2024  Data reviewed by me (LT) 07/10/2022: nursing PT/OT  notes CBC BMP vitals telemetry troponins BNP I/O   Principal Problem:   Acute on chronic systolic CHF (congestive heart failure) (HCC) Active Problems:   Benign essential HTN   Cardiomyopathy, ischemic   Chronic pain syndrome   Atrial fibrillation (HCC)   Benign prostatic hyperplasia   Obstructive sleep apnea syndrome   Sick sinus syndrome ([place])   Acute kidney injury superimposed on CKD ([place])   Metabolic acidosis   Hyponatremia   Hypokalemia   History of anemia due to chronic kidney disease    ASSESSMENT AND PLAN:  Austin [name] is a 34yoM with a PMH of severe 3vCAD s/p CABG x 3 (hx NSTEMI 05/2021 medically managed), SSS s/p Micra Leadless PPM (07/2021), paroxysmal AF on eliquis, HFrEF (25-30%, g3DD, global hypo 06/2022), mild aortic stenosis, CKD 4, HLD who presented to [facility] ED 07/07/2022 with shortness of breath and fatigue.  BNP elevated to 2000 on admission, cardiology is consulted for assistance with CHF. Clinical improvement following IV diuresis, unfortunately worsening renal function as a result + confusion on hospital day 3, workup ongoing.   # acute encephalopathy  Visual hallucinations and pleasantly disoriented today. UA pending. Query delirium, cystitis, vs uremia.   # Acute on chronic HFrEF (25-30%, G3 DD, global hypo-06/2022) BNP 2000 on admission, hypervolemic with dependent 2+ peripheral edema in his lower extremities, with crackles and a supplemental oxygen requirement.  Clinically improving after diuresis. Euvolemic to dry 2/8. Poor PO intake per family.  -s/p IV Lasix 40 mg x 3 doses.  -Continue GDMT with metoprolol tartrate 25 mg twice daily, Imdur 120 mg once daily, hydralazine 25 mg twice daily, no ARB, MRA, SGLT2i with renal insufficiency -Defer repeat echo -Strict I's/O -Will arrange for follow up with [facility] Cardiology at discharge, patient + daughter would like to see MD this visit. Scheduled appointment to see Dr. Treasa School on Monday, 2/26 at 2pm   # AKI on  CKD4 Improvement in BUN/creatinine, and GFR after initial diuresis, continues to worsen and trend 2.08 -- 2.72-- 2.97 and GFR 30 --21--19 -trial gentle IVF today   #  Demand ischemia # Severe 3vCAD s/p CABG x 3   Troponin minimally elevated at 33, 35, in the setting of known renal insufficiency signs of ischemic EKG changes, this is most consistent with demand/supply mismatch and not ACS  -Continue aspirin, atorvastatin 40 mg daily  # NSVT # SSS s/p Micra leadless PPM # Paroxysmal atrial fibrillation on apixaban In sinus rhythm on telemetry with occasional periods of pacing, multiple short runs of NSVT (3-5 beats) in the setting of hypokalemia from loop diuretic use, resolved after IV mag & PO Kcl repletion -Continue Eliquis 2.5 mg twice daily for stroke prevention (age >30, creatinine >1.5) -Monitor and replete electrolytes for a K >4, mag >2  # Goals of care Interested in and appropriate for hospice services at discharge. Has severe 3v CAD unamenable to revascularization  +  now reduced EF with his second admission in <2 months.   This patient's plan of care was discussed and created with Dr. Clayborn Bigness and he is in agreement.  Signed: Tristan Schroeder , PA-C 07/10/2022, 11:53 AM [facility]

## 2022-07-10 NOTE — Progress Notes (Signed)
Manufacturing engineer [facility])   Per MD/Sreenath, patient is not medically stable for discharge today as patient is experience new onset confusion-- further workup to occur.   Once cleared medically, please send signed and completed DNR with patient/family upon discharge. Please provide prescriptions at discharge as needed to ensure ongoing symptom management and a transport packet.   Above information shared with TOC.    Please call with any questions/concerns.    Thank you for the opportunity to participate in this patient's care   Phillis Haggis, MSW [facility] Liaison  [phone]

## 2022-07-11 DIAGNOSIS — I5023 Acute on chronic systolic (congestive) heart failure: Secondary | ICD-10-CM | POA: Diagnosis not present

## 2022-07-11 LAB — BASIC METABOLIC PANEL
Anion gap: 11 (ref 5–15)
BUN: 68 mg/dL — ABNORMAL HIGH (ref 8–23)
CO2: 17 mmol/L — ABNORMAL LOW (ref 22–32)
Calcium: 8.2 mg/dL — ABNORMAL LOW (ref 8.9–10.3)
Chloride: 98 mmol/L (ref 98–111)
Creatinine, Ser: 3.35 mg/dL — ABNORMAL HIGH (ref 0.61–1.24)
GFR, Estimated: 17 mL/min — ABNORMAL LOW (ref >60)
Glucose, Bld: 112 mg/dL — ABNORMAL HIGH (ref 70–99)
Potassium: 5 mmol/L (ref 3.5–5.1)
Sodium: 126 mmol/L — ABNORMAL LOW (ref 135–145)

## 2022-07-11 MED ORDER — LORAZEPAM 0.5 MG PO TABS: 0.5000 mg | ORAL_TABLET | ORAL | 0 refills | Status: DC | PRN

## 2022-07-11 MED ORDER — OXYCODONE HCL 5 MG PO TABS: 5.0000 mg | ORAL_TABLET | ORAL | 0 refills | Status: DC | PRN

## 2022-07-11 MED ORDER — PROCHLORPERAZINE MALEATE 10 MG PO TABS: 10.0000 mg | ORAL_TABLET | ORAL | 0 refills | Status: DC | PRN

## 2022-07-11 MED ORDER — SENNOSIDES 8.6 MG PO TABS: 2.0000 | ORAL_TABLET | Freq: Two times a day (BID) | ORAL | 0 refills | Status: DC

## 2022-07-11 NOTE — Consult Note (Signed)
   [facility] Inpatient Consult   07/11/2022  GLOVER CAPANO [birth year] [id number]  [employer] [ACO] Patient: [employer]  Chart reviewed for less than 30 days readmission with high risk scores and reveals the patient is currently transitioning home to [facility] .   Plan: Patient will have full case management services through Hospice and needs will be met at the hospice level of care. No [facility] Care Management is planned for transitional needs. Will sign off at transition from hospital.  For questions,   Natividad Brood, RN BSN [place]  [phone] business mobile phone Toll free office [phone]  *[place]  [phone] Fax number: [phone] [email or web address]

## 2022-07-11 NOTE — Discharge Summary (Signed)
Physician Discharge Summary  Austin [name] [id number] DOB: [birth year] DOA: 07/07/2022  PCP: Eulas Post, MD  Admit date: 07/07/2022 Discharge date: 07/11/2022  Admitted From: Home Disposition: Home with hospice   Recommendations for Outpatient Follow-up:  Hospice providers   Home Health: No Equipment/Devices: None  Discharge Condition: Stable, discharging home with hospice CODE STATUS: DNR Diet recommendation: Regular  Brief/Interim Summary:  89 y.o. male with medical history significant for paroxysmal a fib, htn, bph, cad, hfref, osa, sick sinus s/p pacer, ckd4, who presents with the above.   Recent admit for nstemi, treated medically.   Since discharge hasn't felt back to normal. Has had worsening fatigue, lack of energy. Also slowly worsening shortness of breath, mainly with exertion. No chest pain but gets occasional epigastric burning sensation. Also with intermittent nausea, no vomiting. Occasional hard stools mixed with loose/liquid stools. No fevers. Home nurse came by today and advised EMS given these symptoms. EMS placed him on 2 L after noting "cyanosis" on his lips.    2/6.  Continue IV diuresis.  Physical therapy consultation.  Cardiology consultation. 2/7: Creatinine worsened today.  Patient feels symptomatically improved.  Interested in engaging hospice services.  TOC and hospice liaison involved.   2/8: Altered mentation and visual hallucinations noted this morning.  Patient is aware that he is hallucinating   Mentation improved on 2/9.  Suspect hospital-acquired delirium.  Conversation with 2 family members at bedside.  Per patient and family's wishes will discharge home with hospice services today.    Discharge Diagnoses:  Principal Problem:   Acute on chronic systolic CHF (congestive heart failure) (HCC) Active Problems:   Cardiomyopathy, ischemic   Acute kidney injury superimposed on CKD (HCC)   Metabolic acidosis   Chronic pain syndrome    Benign essential HTN   Atrial fibrillation (HCC)   Benign prostatic hyperplasia   Obstructive sleep apnea syndrome   Sick sinus syndrome (HCC)   Hyponatremia   Hypokalemia   History of anemia due to chronic kidney disease  Altered mental status Unclear etiology.  Differentials include uremia versus hospital-acquired delirium versus UTI.  UTI less likely given lack of leukocytosis.  Urinalysis pending. Plan: Urinalysis inconsistent with infection.  Mental status improved at time of discharge.  Suspect hospital-acquired delirium.  Appropriate for discharge home with hospice services.   Acute on chronic systolic CHF (congestive heart failure) (HCC) Volume status optimized is much as possible in the setting of deteriorating kidney function.  Patient will see cardiology postdischarge but will discharge home with hospice services.  Ischemic cardiomyopathy Patient with severe three-vessel coronary artery disease not amenable to revascularization.  He is discharging home with hospice services.  Discharge Instructions  Discharge Instructions     Diet - low sodium heart healthy   Complete by: As directed    Increase activity slowly   Complete by: As directed       Allergies as of 07/11/2022       Reactions   Penicillins Itching, Swelling, Rash        Medication List     STOP taking these medications    apixaban 2.5 MG Tabs tablet Commonly known as: ELIQUIS   aspirin 81 MG chewable tablet   atorvastatin 40 MG tablet Commonly known as: LIPITOR   torsemide 20 MG tablet Commonly known as: DEMADEX       TAKE these medications    Biotin 5000 MCG Caps Take 5,000 mcg by mouth daily.   COQ-10 PO Take 200 mg by  mouth daily.   dorzolamide-timolol 2-0.5 % ophthalmic solution Commonly known as: COSOPT Place 1 drop into both eyes 2 (two) times daily.   hydrALAZINE 25 MG tablet Commonly known as: APRESOLINE Take 1 tablet (25 mg total) by mouth 2 (two) times daily.    isosorbide mononitrate 120 MG 24 hr tablet Commonly known as: IMDUR Take 1 tablet (120 mg total) by mouth daily.   latanoprost 0.005 % ophthalmic solution Commonly known as: XALATAN Place 1 drop into both eyes at bedtime.   LORazepam 0.5 MG tablet Commonly known as: ATIVAN Take 1 tablet (0.5 mg total) by mouth every 4 (four) hours as needed for anxiety. May crush, mix with water and give sublingually if needed.   Melatonin 5 MG Caps Take 5 mg by mouth at bedtime as needed (for sleep).   metoprolol tartrate 50 MG tablet Commonly known as: LOPRESSOR Take 0.5 tablets (25 mg total) by mouth 2 (two) times daily.   nitroGLYCERIN 0.4 MG SL tablet Commonly known as: NITROSTAT Place 0.4 mg under the tongue every 5 (five) minutes as needed for chest pain.   omeprazole 20 MG capsule Commonly known as: PRILOSEC Take 20 mg by mouth daily.   oxyCODONE 5 MG immediate release tablet Commonly known as: Oxy IR/ROXICODONE Take 1 tablet (5 mg total) by mouth every 4 (four) hours as needed for severe pain. May crush, mix with water and give sublingually if needed.   prochlorperazine 10 MG tablet Commonly known as: COMPAZINE Take 1 tablet (10 mg total) by mouth every 4 (four) hours as needed for nausea or vomiting. May crush, mix with water and give sublingually.   ranolazine 500 MG 12 hr tablet Commonly known as: RANEXA Take 1 tablet by mouth 2 (two) times daily. What changed: Another medication with the same name was removed. Continue taking this medication, and follow the directions you see here.   senna 8.6 MG tablet Commonly known as: Senokot Take 2 tablets (17.2 mg total) by mouth 2 (two) times daily. May crush, mix with water and give sublingually if needed.   SYSTANE OP Place 1 drop into the right eye daily as needed (burning).   tamsulosin 0.4 MG Caps capsule Commonly known as: FLOMAX TAKE 1 CAPSULE BY MOUTH ONCE DAILY   TURMERIC PO Take 1,000 mg by mouth daily.   Vitamin  B-12 5000 MCG Subl Place 5,000 mcg under the tongue daily.   Vitamin D3 50 MCG (2000 UT) capsule Take 2,000 Units by mouth 2 (two) times daily.        Follow-up Information     [place] in 2 week(s).   Why: has appt scheduled with Dr. Redgie Grayer with [facility] on Monday, 2/26 at [facility] information: [address] [place] [address] [phone]                Allergies  Allergen Reactions   Penicillins Itching, Swelling and Rash    Consultations: Cardiology   Procedures/Studies: US ABDOMEN LIMITED RUQ (LIVER/GB)  Result Date: 07/07/2022 CLINICAL DATA:  Nausea.  Elevated bilirubin. EXAM: ULTRASOUND ABDOMEN LIMITED RIGHT UPPER QUADRANT COMPARISON:  None Available. FINDINGS: Gallbladder: The gallbladder is thick walled measuring 3.6 mm. The gallbladder is otherwise normal. Common bile duct: Diameter: 3.1 mm Liver: Nodular contour. No mass. Diffuse increased heterogeneous echogenicity. Portal vein is patent on color Doppler imaging with normal direction of blood flow towards the liver. Other: Mild perihepatic ascites. IMPRESSION: 1. Cirrhotic morphology in the liver  with no mass. Mild perihepatic ascites. 2. The gallbladder wall is mildly thickened measuring 3.6 mm. The gallbladder is otherwise normal in appearance. Electronically Signed   By: Dorise Bullion III M.D.   On: 07/07/2022 16:56   DG Abd 1 View  Result Date: 07/07/2022 CLINICAL DATA:  Burning chest pain and shortness of breath, nausea, bilirubinemia. EXAM: ABDOMEN - 1 VIEW COMPARISON:  Plain film of the abdomen dated 04/02/2010. FINDINGS: t visualized bowel gas pattern is nonobstructive. No evidence of soft tissue mass or abnormal fluid collection. No evidence of free intraperitoneal air. No evidence of renal or ureteral calculi. No acute-appearing osseous abnormality. Extensive vascular calcifications. IMPRESSION: 1. No acute findings. Nonobstructive bowel gas pattern.  2. Extensive vascular calcifications. Electronically Signed   By: Franki Cabot M.D.   On: 07/07/2022 16:00   DG Chest Portable 1 View  Result Date: 07/07/2022 CLINICAL DATA:  Chest pain EXAM: PORTABLE CHEST - 1 VIEW COMPARISON:  06/12/2022 FINDINGS: Heart is mildly enlarged. Mild pulmonary vascular congestion. Postsurgical changes of CABG and ACDF are seen. Interval development of patchy bibasilar airspace opacities and minimal pleural effusions. Degenerative changes of the shoulders again seen. Surgical anchors noted in the right humeral head. IMPRESSION: Interval development of mild cardiomegaly, pulmonary vascular congestion, bibasilar opacities and small pleural effusions, consistent with CHF. Electronically Signed   By: Miachel Roux M.D.   On: 07/07/2022 12:55   ECHOCARDIOGRAM COMPLETE  Result Date: 06/14/2022    ECHOCARDIOGRAM REPORT   Patient Name:   ALFREDO DIEKMANN Date of Exam: 06/13/2022 Medical Rec #:  [id number]        Height:       66.0 in Accession #:    [id number]       Weight:       150.0 lb Date of Birth:  1928/11/13         BSA:          1.770 m Patient Age:    14 years         BP:           135/81 mmHg Patient Gender: M                HR:           71 bpm. Exam Location:  [facility] Procedure: 2D Echo, Cardiac Doppler and Color Doppler Indications:     Elevated troponin                  Dyspnea R06.00                  Chest pain R07.9  History:         Patient has prior history of Echocardiogram examinations, most                  recent 07/11/2019.  Sonographer:     Sherrie Sport Referring Phys:  [id number] AMY N COX Diagnosing Phys: Yolonda Kida MD IMPRESSIONS  1. Left ventricular ejection fraction, by estimation, is 25 to 30%. The left ventricle has severely decreased function. The left ventricle demonstrates global hypokinesis. The left ventricular internal cavity size was mildly to moderately dilated. There  is mild left ventricular hypertrophy. Left ventricular diastolic parameters are  consistent with Grade III diastolic dysfunction (restrictive).  2. Right ventricular systolic function is moderately reduced. The right ventricular size is mildly enlarged.  3. Left atrial size was mildly dilated.  4. Right atrial size was mildly dilated.  5. The  mitral valve is grossly normal. Mild mitral valve regurgitation.  6. The aortic valve is grossly normal. Aortic valve regurgitation is trivial. FINDINGS  Left Ventricle: Left ventricular ejection fraction, by estimation, is 25 to 30%. The left ventricle has severely decreased function. The left ventricle demonstrates global hypokinesis. The left ventricular internal cavity size was mildly to moderately dilated. There is mild left ventricular hypertrophy. Left ventricular diastolic parameters are consistent with Grade III diastolic dysfunction (restrictive). Right Ventricle: The right ventricular size is mildly enlarged. No increase in right ventricular wall thickness. Right ventricular systolic function is moderately reduced. Left Atrium: Left atrial size was mildly dilated. Right Atrium: Right atrial size was mildly dilated. Pericardium: There is no evidence of pericardial effusion. Mitral Valve: The mitral valve is grossly normal. Mild mitral valve regurgitation. Tricuspid Valve: The tricuspid valve is normal in structure. Tricuspid valve regurgitation is mild. Aortic Valve: The aortic valve is grossly normal. Aortic valve regurgitation is trivial. Aortic valve mean gradient measures 8.0 mmHg. Aortic valve peak gradient measures 14.2 mmHg. Aortic valve area, by VTI measures 1.21 cm. Pulmonic Valve: The pulmonic valve was grossly normal. Pulmonic valve regurgitation is not visualized. Aorta: The ascending aorta was not well visualized. IAS/Shunts: No atrial level shunt detected by color flow Doppler. Additional Comments: A device lead is visualized.  LEFT VENTRICLE PLAX 2D LVIDd:         5.30 cm      Diastology LVIDs:         4.80 cm      LV e' medial:     3.92 cm/s LV PW:         1.20 cm      LV E/e' medial:  35.2 LV IVS:        1.20 cm      LV e' lateral:   10.20 cm/s LVOT diam:     2.00 cm      LV E/e' lateral: 13.5 LV SV:         36 LV SV Index:   20 LVOT Area:     3.14 cm  LV Volumes (MOD) LV vol d, MOD A2C: 125.0 ml LV vol d, MOD A4C: 130.0 ml LV vol s, MOD A2C: 86.6 ml LV vol s, MOD A4C: 84.4 ml LV SV MOD A2C:     38.4 ml LV SV MOD A4C:     130.0 ml LV SV MOD BP:      44.2 ml RIGHT VENTRICLE RV Basal diam:  3.90 cm RV Mid diam:    3.50 cm RV S prime:     7.62 cm/s TAPSE (M-mode): 1.4 cm LEFT ATRIUM             Index        RIGHT ATRIUM           Index LA diam:        4.65 cm 2.63 cm/m   RA Area:     15.60 cm LA Vol (A2C):   75.8 ml 42.83 ml/m  RA Volume:   36.60 ml  20.68 ml/m LA Vol (A4C):   46.0 ml 25.99 ml/m LA Biplane Vol: 61.1 ml 34.53 ml/m  AORTIC VALVE AV Area (Vmax):    1.21 cm AV Area (Vmean):   1.14 cm AV Area (VTI):     1.21 cm AV Vmax:           188.67 cm/s AV Vmean:          130.667 cm/s AV VTI:  0.297 m AV Peak Grad:      14.2 mmHg AV Mean Grad:      8.0 mmHg LVOT Vmax:         72.70 cm/s LVOT Vmean:        47.400 cm/s LVOT VTI:          0.114 m LVOT/AV VTI ratio: 0.38  AORTA Ao Root diam: 3.20 cm MITRAL VALVE                TRICUSPID VALVE MV Area (PHT): 8.43 cm     TR Peak grad:   30.0 mmHg MV Decel Time: 90 msec      TR Vmax:        274.00 cm/s MV E velocity: 138.00 cm/s MV A velocity: 59.70 cm/s   SHUNTS MV E/A ratio:  2.31         Systemic VTI:  0.11 m                             Systemic Diam: 2.00 cm Yolonda Kida MD Electronically signed by Yolonda Kida MD Signature Date/Time: 06/14/2022/9:33:40 PM    Final    DG Chest 2 View  Result Date: 06/12/2022 CLINICAL DATA:  Chest pain. EXAM: CHEST - 2 VIEW COMPARISON:  September 09, 2021. FINDINGS: Mild cardiomegaly is noted. Status post coronary bypass graft. Minimal bilateral pleural effusions are noted. No consolidative process is noted. Bony thorax is unremarkable.  IMPRESSION: Minimal bilateral pleural effusions. Status post coronary bypass graft. Electronically Signed   By: Marijo Conception M.D.   On: 06/12/2022 16:22      Subjective: Seen and examined on the day of discharge.  Stable, mentating clearly.  Appropriate for discharge home with hospice services.  Discharge Exam: Vitals:   07/11/22 0845 07/11/22 1138  BP: 130/67 106/61  Pulse: 72 76  Resp: 16 16  Temp: (!) 97.5 F (36.4 C) 97.9 F (36.6 C)  SpO2: 99% 92%   Vitals:   07/11/22 0439 07/11/22 0845 07/11/22 0909 07/11/22 1138  BP: 115/74 130/67  106/61  Pulse: 79 72  76  Resp: 19 16  16  $ Temp: 97.8 F (36.6 C) (!) 97.5 F (36.4 C)  97.9 F (36.6 C)  TempSrc: Oral     SpO2: 100% 99%  92%  Weight:   68.6 kg   Height:        General: Pt is alert, awake, not in acute distress Cardiovascular: RRR, S1/S2 +, no rubs, no gallops Respiratory: CTA bilaterally, no wheezing, no rhonchi Abdominal: Soft, NT, ND, bowel sounds + Extremities: no edema, no cyanosis    The results of significant diagnostics from this hospitalization (including imaging, microbiology, ancillary and laboratory) are listed below for reference.     Microbiology: No results found for this or any previous visit (from the past 240 hour(s)).   Labs: BNP (last 3 results) Recent Labs    06/13/22 1348 07/07/22 1231  BNP 993.4* [id number]*   Basic Metabolic Panel: Recent Labs  Lab 07/07/22 1231 07/08/22 0410 07/09/22 0607 07/10/22 0247 07/11/22 0806  NA 124* 129* 127* 127* 126*  K 4.9 3.2* 4.6 5.1 5.0  CL 95* 104 99 99 98  CO2 19* 16* 17* 17* 17*  GLUCOSE 132* 82 83 99 112*  BUN 48* 40* 48* 55* 68*  CREATININE 2.56* 2.03* 2.72* 2.97* 3.35*  CALCIUM 8.2* 6.6* 8.1* 8.2* 8.2*   Liver Function Tests: Recent  Labs  Lab 07/07/22 1231  AST 29  ALT 22  ALKPHOS 129*  BILITOT 1.8*  PROT 6.3*  ALBUMIN 3.5   No results for input(s): "LIPASE", "AMYLASE" in the last 168 hours. No results for input(s):  "AMMONIA" in the last 168 hours. CBC: Recent Labs  Lab 07/07/22 1231 07/08/22 0410 07/09/22 0607 07/10/22 0247  WBC 7.7 7.1 7.3 7.6  NEUTROABS 6.7  --   --  6.2  HGB 11.3* 10.0* 10.9* 11.0*  HCT 34.7* 30.3* 32.9* 32.9*  MCV 91.3 91.0 89.2 89.6  PLT 108* 96* 111* 117*   Cardiac Enzymes: No results for input(s): "CKTOTAL", "CKMB", "CKMBINDEX", "TROPONINI" in the last 168 hours. BNP: Invalid input(s): "POCBNP" CBG: Recent Labs  Lab 07/09/22 1938  GLUCAP 107*   D-Dimer No results for input(s): "DDIMER" in the last 72 hours. Hgb A1c No results for input(s): "HGBA1C" in the last 72 hours. Lipid Profile No results for input(s): "CHOL", "HDL", "LDLCALC", "TRIG", "CHOLHDL", "LDLDIRECT" in the last 72 hours. Thyroid function studies No results for input(s): "TSH", "T4TOTAL", "T3FREE", "THYROIDAB" in the last 72 hours.  Invalid input(s): "FREET3" Anemia work up Recent Labs    07/09/22 0607  FERRITIN 109   Urinalysis    Component Value Date/Time   COLORURINE AMBER (A) 07/10/2022 0944   APPEARANCEUR CLEAR (A) 07/10/2022 0944   LABSPEC 1.016 07/10/2022 0944   PHURINE 5.0 07/10/2022 [place] 07/10/2022 [place] 07/10/2022 [place] 07/10/2022 0944   KETONESUR NEGATIVE 07/10/2022 0944   PROTEINUR 30 (A) 07/10/2022 0944   NITRITE NEGATIVE 07/10/2022 0944   LEUKOCYTESUR NEGATIVE 07/10/2022 0944   Sepsis Labs Recent Labs  Lab 07/07/22 1231 07/08/22 0410 07/09/22 0607 07/10/22 0247  WBC 7.7 7.1 7.3 7.6   Microbiology No results found for this or any previous visit (from the past 240 hour(s)).   Time coordinating discharge: Over 30 minutes  SIGNED:   Sidney Ace, MD  [facility] 07/11/2022, 1:27 PM Pager   If 7PM-7AM, please contact night-coverage

## 2022-07-11 NOTE — TOC Transition Note (Signed)
Transition of Care [facility]) - CM/SW Discharge Note   Patient Details  Name: Austin [name] MRN: [id number] Date of Birth: [birth year]  [facility] CM/SW Contact:  Tiburcio Bash, LCSW Phone Number: 07/11/2022, 10:57 AM   Clinical Narrative:     Patient to discharge home with [facility] today, daughter to transport home. TOC signing off.    Final next level of care: Home w Hospice Care Barriers to Discharge: No Barriers Identified   Patient Goals and CMS Choice      Discharge Placement                         Discharge Plan and Services Additional resources added to the After Visit Summary for                                       Social Determinants of Health (SDOH) Interventions SDOH Screenings   Food Insecurity: No Food Insecurity (07/08/2022)  Housing: Low Risk  (07/08/2022)  Transportation Needs: No Transportation Needs (07/08/2022)  Utilities: Not At Risk (07/08/2022)  Alcohol Screen: Low Risk  (06/05/2022)  Depression (PHQ2-9): Low Risk  (06/05/2022)  Financial Resource Strain: Low Risk  (06/05/2022)  Physical Activity: Sufficiently Active (06/05/2022)  Social Connections: Moderately Integrated (06/05/2022)  Stress: No Stress Concern Present (06/05/2022)  Tobacco Use: Medium Risk (07/09/2022)     Readmission Risk Interventions     No data to display

## 2022-07-15 ENCOUNTER — Ambulatory Visit: Payer: PPO | Admitting: Podiatry

## 2022-07-21 ENCOUNTER — Encounter: Payer: PPO | Admitting: Family

## 2022-08-19 ENCOUNTER — Ambulatory Visit (INDEPENDENT_AMBULATORY_CARE_PROVIDER_SITE_OTHER): Payer: PPO | Admitting: Vascular Surgery

## 2022-08-19 ENCOUNTER — Encounter (INDEPENDENT_AMBULATORY_CARE_PROVIDER_SITE_OTHER): Payer: PPO

## 2022-09-01 DEATH — deceased

## 2022-12-04 IMAGING — CR DG CHEST 2V
1 series · 2 of 2 positions shown · non-contrast
Comparison: Chest XR most recently 07/10/2019.

CLINICAL DATA: CHF.  Shortness breath.

EXAM:
CHEST - 2 VIEW

[Series 1: dg chest 2 view · 0.14mm/px · 2 of 2 slices shown]
[im 1/2]
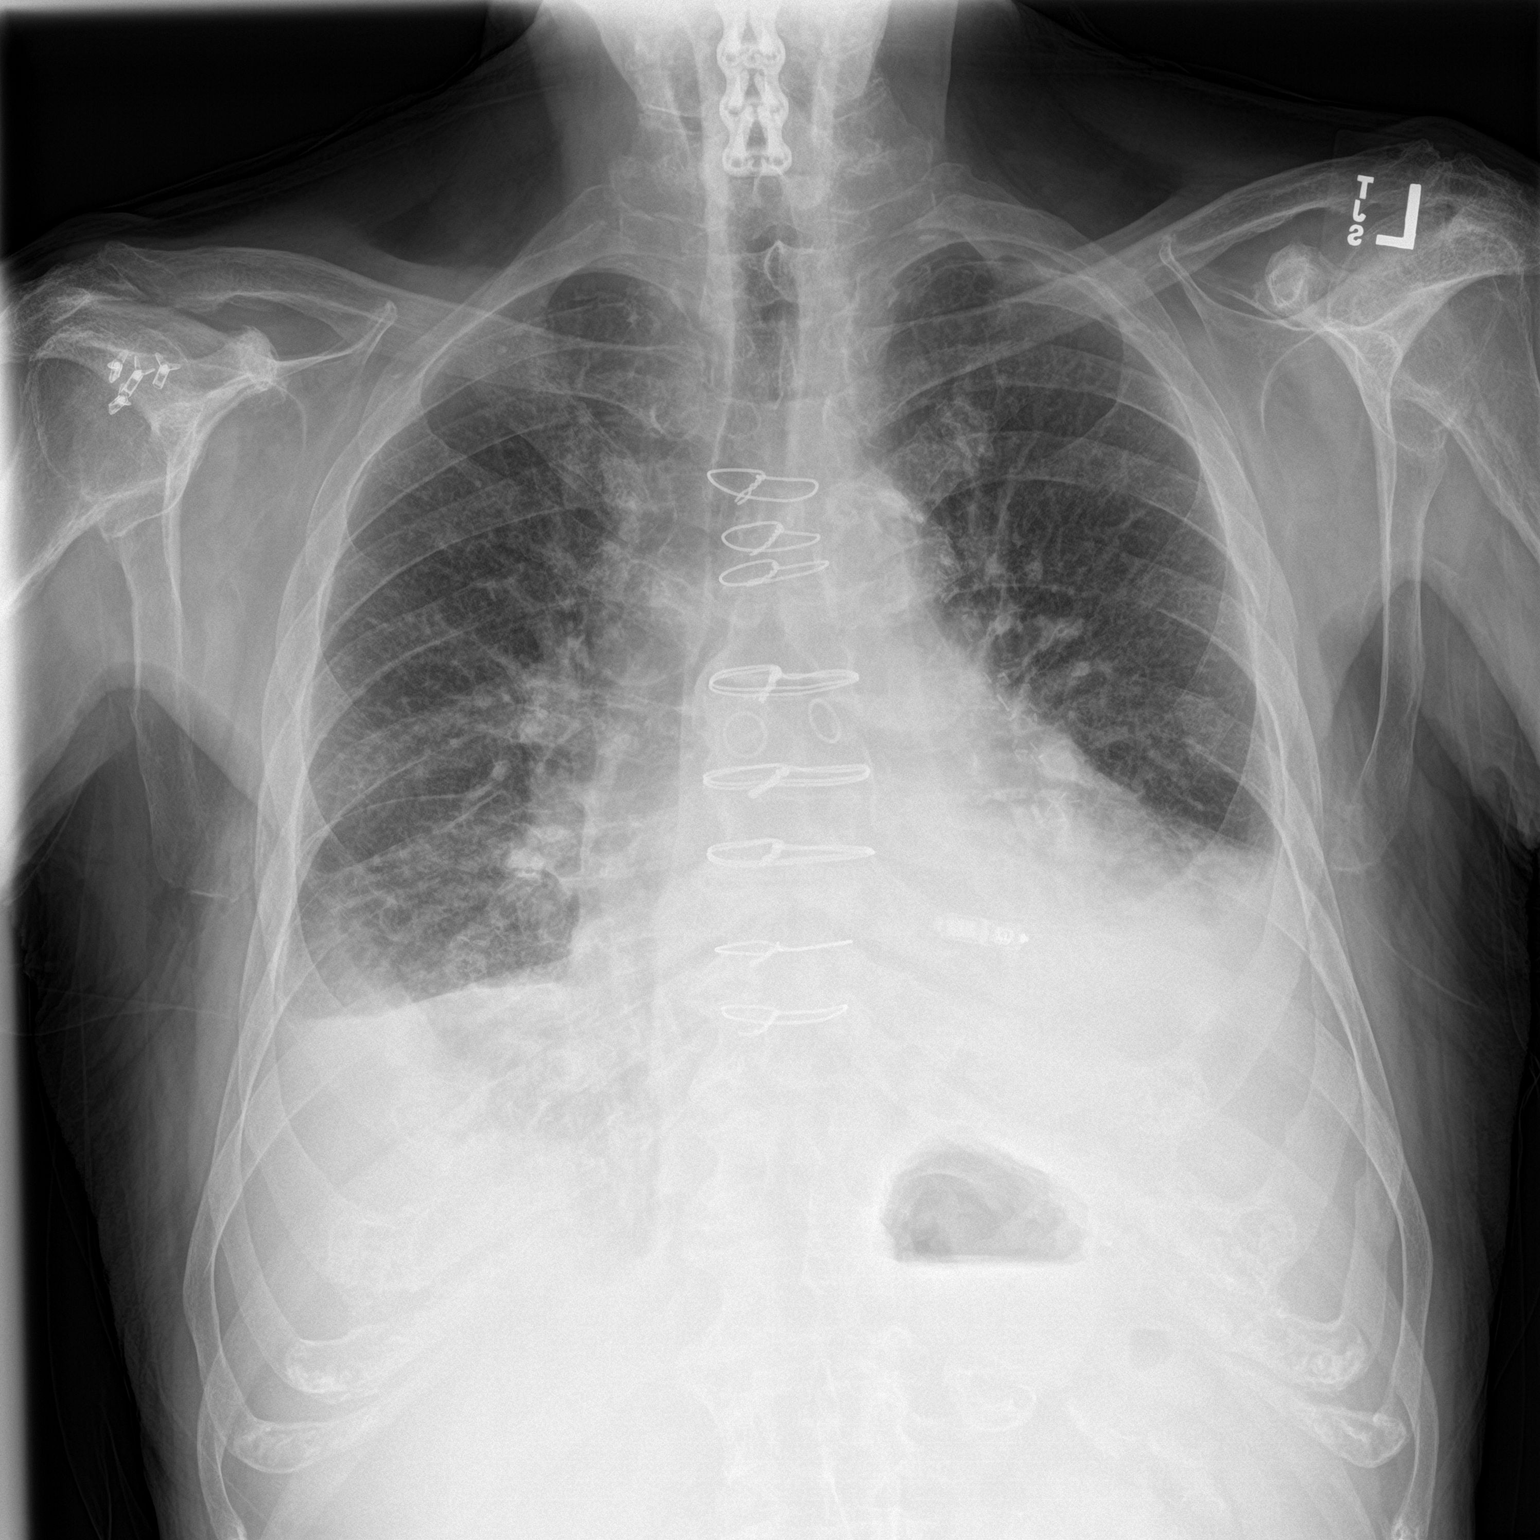
[im 2/2]
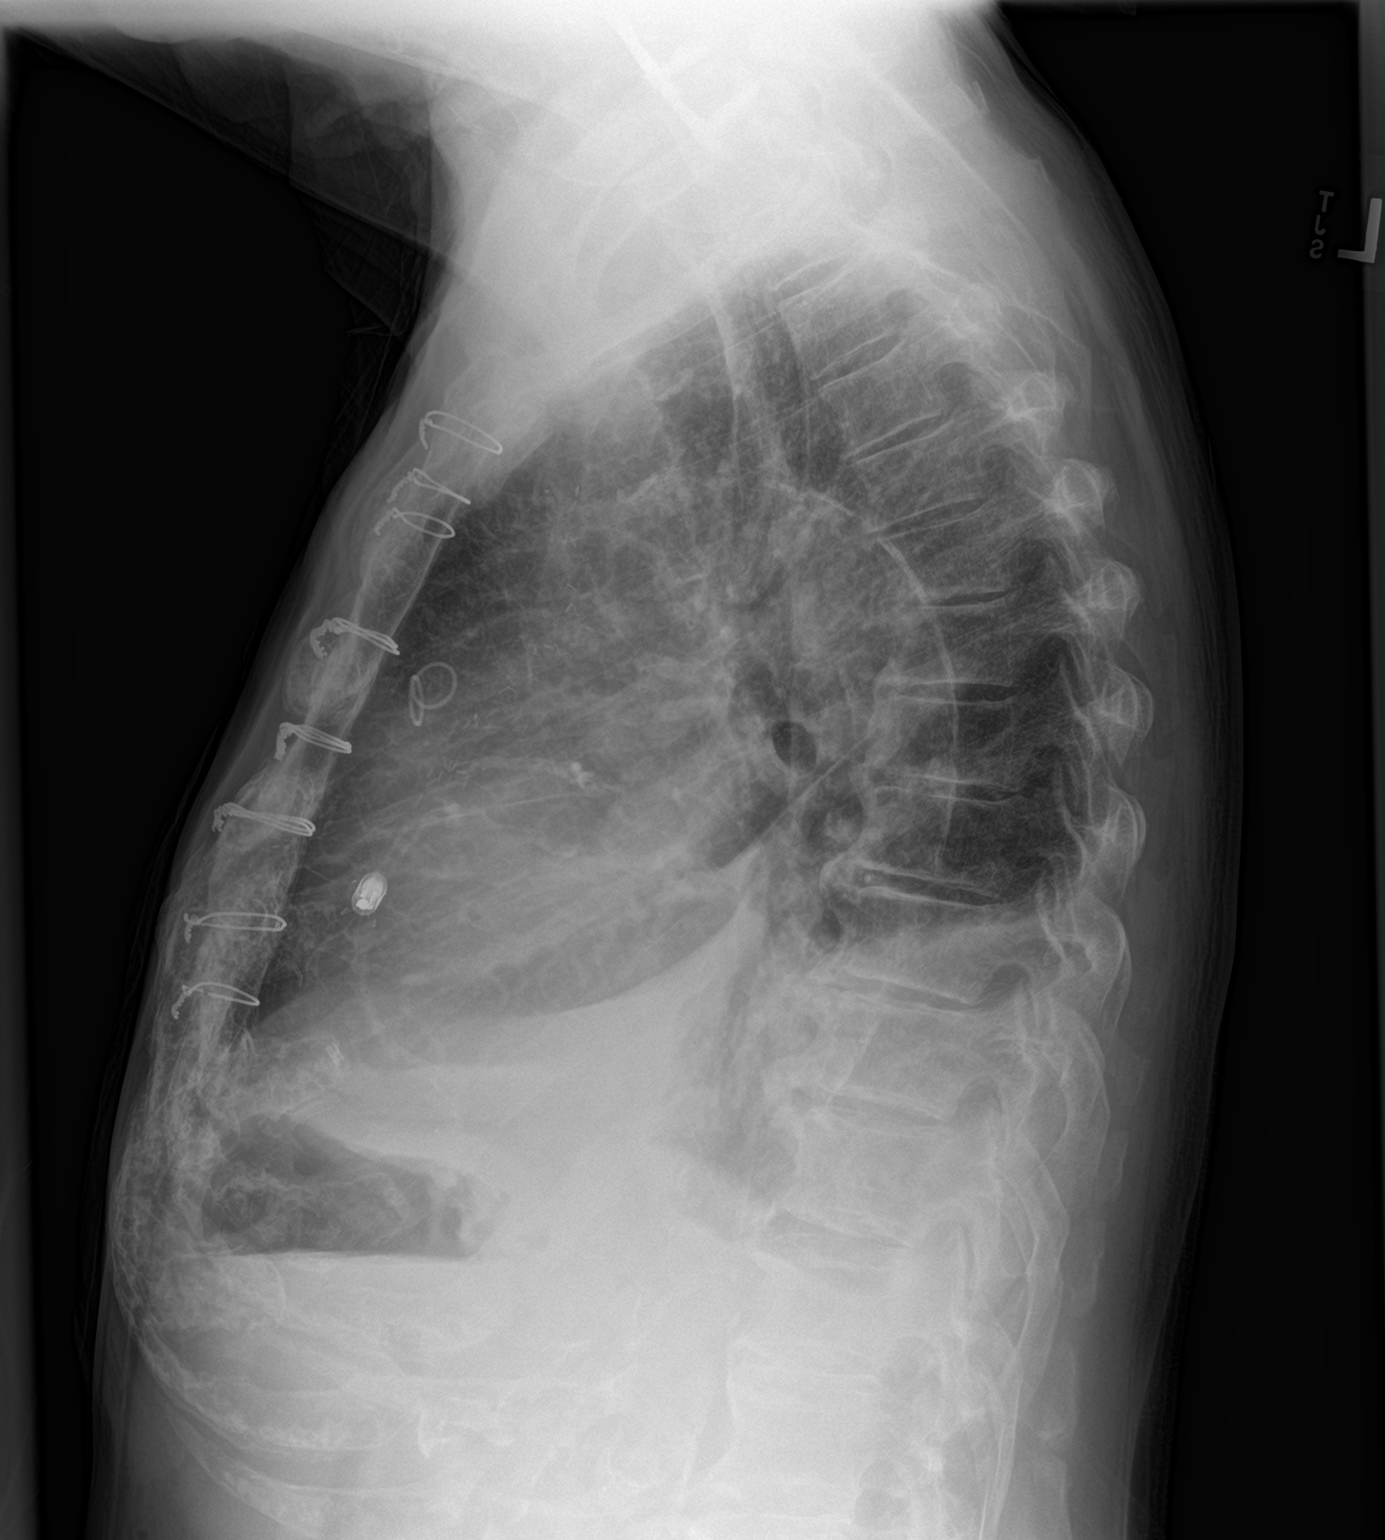

[2 of 2 positions shown; findings below may reference images not displayed]

FINDINGS: Mild persistent enlargement of the cardiac silhouette. Aortic
Atherosclerosis ([id number]). Coronary bypass. Cardiac recorder.
Hypoinflation with streaky perihilar opacities. Trace fluid layering
along the minor fissure. Small volume bilateral pleural effusions,
greater on LEFT, with adjacent consolidation. No pneumothorax. RIGHT
glenohumeral suture anchors. Multilevel ACDF, incompletely imaged.
No acute osseous abnormality.
IMPRESSION: 1. Cardiomegaly with mild pulmonary edema
2. Small volume bilateral pleural effusions, greater on LEFT, with
adjacent opacities.
Findings likely to represent atelectasis, however superimposed
pneumonia can appear similar.
